# Patient Record
Sex: Female | Born: 1940 | Race: White | Hispanic: No | Smoking: Never smoker
Health system: Southern US, Community
[De-identification: ages and names within clinical notes are randomized; demographics above are authoritative.]

## PROBLEM LIST (undated history)

## (undated) DIAGNOSIS — I639 Cerebral infarction, unspecified: Secondary | ICD-10-CM

## (undated) DIAGNOSIS — F419 Anxiety disorder, unspecified: Secondary | ICD-10-CM

## (undated) DIAGNOSIS — R7303 Prediabetes: Secondary | ICD-10-CM

## (undated) DIAGNOSIS — D649 Anemia, unspecified: Secondary | ICD-10-CM

## (undated) DIAGNOSIS — I350 Nonrheumatic aortic (valve) stenosis: Secondary | ICD-10-CM

## (undated) DIAGNOSIS — J189 Pneumonia, unspecified organism: Secondary | ICD-10-CM

## (undated) DIAGNOSIS — E039 Hypothyroidism, unspecified: Secondary | ICD-10-CM

## (undated) DIAGNOSIS — R011 Cardiac murmur, unspecified: Secondary | ICD-10-CM

## (undated) DIAGNOSIS — G3184 Mild cognitive impairment, so stated: Secondary | ICD-10-CM

## (undated) DIAGNOSIS — F339 Major depressive disorder, recurrent, unspecified: Secondary | ICD-10-CM

## (undated) DIAGNOSIS — Z789 Other specified health status: Secondary | ICD-10-CM

## (undated) DIAGNOSIS — M1990 Unspecified osteoarthritis, unspecified site: Secondary | ICD-10-CM

## (undated) DIAGNOSIS — T7840XA Allergy, unspecified, initial encounter: Secondary | ICD-10-CM

## (undated) DIAGNOSIS — I10 Essential (primary) hypertension: Secondary | ICD-10-CM

## (undated) DIAGNOSIS — Z7409 Other reduced mobility: Secondary | ICD-10-CM

## (undated) DIAGNOSIS — M8580 Other specified disorders of bone density and structure, unspecified site: Secondary | ICD-10-CM

## (undated) HISTORY — PX: TONSILLECTOMY: SUR1361

## (undated) HISTORY — DX: Essential (primary) hypertension: I10

## (undated) HISTORY — PX: NO PAST SURGERIES: SHX2092

## (undated) HISTORY — DX: Allergy, unspecified, initial encounter: T78.40XA

## (undated) HISTORY — DX: Cerebral infarction, unspecified: I63.9

## (undated) HISTORY — PX: CATARACT EXTRACTION: SUR2

## (undated) HISTORY — PX: TOOTH EXTRACTION: SUR596

## (undated) HISTORY — PX: ABDOMINAL HYSTERECTOMY: SHX81

## (undated) HISTORY — PX: JOINT REPLACEMENT: SHX530

## (undated) HISTORY — DX: Other specified disorders of bone density and structure, unspecified site: M85.80

## (undated) HISTORY — PX: BACK SURGERY: SHX140

## (undated) HISTORY — DX: Mild cognitive impairment, so stated: G31.84

## (undated) HISTORY — PX: BRAIN SURGERY: SHX531

## (undated) HISTORY — DX: Nonrheumatic aortic (valve) stenosis: I35.0

## (undated) HISTORY — DX: Other reduced mobility: Z74.09

---
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
## 1998-09-06 ENCOUNTER — Other Ambulatory Visit: Admission: RE | Admit: 1998-09-06 | Discharge: 1998-09-06 | Payer: Self-pay | Admitting: Obstetrics and Gynecology

## 2002-05-04 ENCOUNTER — Encounter: Admission: RE | Admit: 2002-05-04 | Discharge: 2002-05-04 | Payer: Self-pay | Admitting: Family Medicine

## 2009-07-12 ENCOUNTER — Ambulatory Visit: Payer: Self-pay | Admitting: Family Medicine

## 2009-07-12 DIAGNOSIS — E039 Hypothyroidism, unspecified: Secondary | ICD-10-CM | POA: Insufficient documentation

## 2009-07-12 DIAGNOSIS — I10 Essential (primary) hypertension: Secondary | ICD-10-CM | POA: Insufficient documentation

## 2009-07-12 DIAGNOSIS — F411 Generalized anxiety disorder: Secondary | ICD-10-CM | POA: Insufficient documentation

## 2009-07-12 DIAGNOSIS — Z8673 Personal history of transient ischemic attack (TIA), and cerebral infarction without residual deficits: Secondary | ICD-10-CM

## 2009-07-12 DIAGNOSIS — G4700 Insomnia, unspecified: Secondary | ICD-10-CM | POA: Insufficient documentation

## 2009-07-12 LAB — CONVERTED CEMR LAB
ALT: 49 units/L — ABNORMAL HIGH (ref 0–35)
AST: 39 units/L — ABNORMAL HIGH (ref 0–37)
Albumin: 4.4 g/dL (ref 3.5–5.2)
Alkaline Phosphatase: 84 units/L (ref 39–117)
BUN: 14 mg/dL (ref 6–23)
CO2: 26 meq/L (ref 19–32)
Calcium: 10 mg/dL (ref 8.4–10.5)
Chloride: 100 meq/L (ref 96–112)
Cholesterol: 238 mg/dL — ABNORMAL HIGH (ref 0–200)
Creatinine, Ser: 0.87 mg/dL (ref 0.40–1.20)
Glucose, Bld: 112 mg/dL — ABNORMAL HIGH (ref 70–99)
HDL: 56 mg/dL (ref >39)
LDL Cholesterol: 138 mg/dL — ABNORMAL HIGH (ref 0–99)
Potassium: 4.7 meq/L (ref 3.5–5.3)
Sodium: 142 meq/L (ref 135–145)
TSH: 3.923 microintl units/mL (ref 0.350–4.500)
Total Bilirubin: 0.4 mg/dL (ref 0.3–1.2)
Total CHOL/HDL Ratio: 4.3
Total Protein: 7.8 g/dL (ref 6.0–8.3)
Triglycerides: 221 mg/dL — ABNORMAL HIGH (ref <150)
VLDL: 44 mg/dL — ABNORMAL HIGH (ref 0–40)

## 2009-07-22 ENCOUNTER — Encounter: Payer: Self-pay | Admitting: Family Medicine

## 2009-09-02 ENCOUNTER — Ambulatory Visit: Payer: Self-pay | Admitting: Family Medicine

## 2009-09-07 ENCOUNTER — Telehealth: Payer: Self-pay | Admitting: *Deleted

## 2009-09-07 ENCOUNTER — Encounter (INDEPENDENT_AMBULATORY_CARE_PROVIDER_SITE_OTHER): Payer: Self-pay | Admitting: *Deleted

## 2009-09-09 ENCOUNTER — Encounter (INDEPENDENT_AMBULATORY_CARE_PROVIDER_SITE_OTHER): Payer: Self-pay | Admitting: *Deleted

## 2009-09-12 ENCOUNTER — Ambulatory Visit: Payer: Self-pay | Admitting: Gastroenterology

## 2009-09-22 ENCOUNTER — Ambulatory Visit: Payer: Self-pay | Admitting: Gastroenterology

## 2009-12-12 ENCOUNTER — Ambulatory Visit: Payer: Self-pay | Admitting: Family Medicine

## 2009-12-12 DIAGNOSIS — I350 Nonrheumatic aortic (valve) stenosis: Secondary | ICD-10-CM | POA: Insufficient documentation

## 2009-12-12 DIAGNOSIS — I062 Rheumatic aortic stenosis with insufficiency: Secondary | ICD-10-CM

## 2009-12-28 ENCOUNTER — Encounter: Payer: Self-pay | Admitting: *Deleted

## 2010-05-23 ENCOUNTER — Encounter: Payer: Self-pay | Admitting: Family Medicine

## 2010-05-25 ENCOUNTER — Encounter: Payer: Self-pay | Admitting: Family Medicine

## 2010-05-25 ENCOUNTER — Ambulatory Visit: Payer: Self-pay | Admitting: Family Medicine

## 2010-05-25 LAB — CONVERTED CEMR LAB
ALT: 16 units/L (ref 0–35)
AST: 17 units/L (ref 0–37)
Albumin: 4.1 g/dL (ref 3.5–5.2)
Alkaline Phosphatase: 100 units/L (ref 39–117)
BUN: 13 mg/dL (ref 6–23)
CO2: 31 meq/L (ref 19–32)
Calcium: 9.4 mg/dL (ref 8.4–10.5)
Chloride: 100 meq/L (ref 96–112)
Creatinine, Ser: 0.79 mg/dL (ref 0.40–1.20)
Glucose, Bld: 89 mg/dL (ref 70–99)
Potassium: 4.2 meq/L (ref 3.5–5.3)
Sodium: 141 meq/L (ref 135–145)
TSH: 3.803 microintl units/mL (ref 0.350–4.500)
Total Bilirubin: 0.3 mg/dL (ref 0.3–1.2)
Total Protein: 7.3 g/dL (ref 6.0–8.3)

## 2010-05-26 ENCOUNTER — Telehealth: Payer: Self-pay | Admitting: Family Medicine

## 2010-06-01 ENCOUNTER — Encounter: Payer: Self-pay | Admitting: Family Medicine

## 2010-06-07 ENCOUNTER — Encounter: Payer: Self-pay | Admitting: Family Medicine

## 2010-07-13 ENCOUNTER — Encounter (INDEPENDENT_AMBULATORY_CARE_PROVIDER_SITE_OTHER): Payer: Self-pay | Admitting: *Deleted

## 2010-07-20 NOTE — Letter (Signed)
Summary: Previsit letter  [facility]  [address] [place], [place] [address]   Phone: [phone]  Fax: [phone]       09/07/2009 MRN: [id number]  Roizy Gruenberg [address] [place], [place]  [address]  Dear Ms. Madan,  Welcome to the Gastroenterology Division at [facility].    You are scheduled to see a nurse for your pre-procedure visit on 09-12-09 at 1:30p.m. on the 3rd floor at [facility], [address]  We ask that you try to arrive at our office 15 minutes prior to your appointment time to allow for check-in.  Your nurse visit will consist of discussing your medical and surgical history, your immediate family medical history, and your medications.    Please bring a complete list of all your medications or, if you prefer, bring the medication bottles and we will list them.  We will need to be aware of both prescribed and over the counter drugs.  We will need to know exact dosage information as well.  If you are on blood thinners (Coumadin, Plavix, Aggrenox, Ticlid, etc.) please call our office today/prior to your appointment, as we need to consult with your physician about holding your medication.   Please be prepared to read and sign documents such as consent forms, a financial agreement, and acknowledgement forms.  If necessary, and with your consent, a friend or relative is welcome to sit-in on the nurse visit with you.  Please bring your insurance card so that we may make a copy of it.  If your insurance requires a referral to see a specialist, please bring your referral form from your primary care physician.  No co-pay is required for this nurse visit.     If you cannot keep your appointment, please call [phone] to cancel or reschedule prior to your appointment date.  This allows [place] the opportunity to schedule an appointment for another patient in need of care.    Thank you for choosing [facility] for your medical needs.  We  appreciate the opportunity to care for you.  Please visit [place] at our website  to learn more about our practice.                     Sincerely.                                                                                                                   The Gastroenterology Division

## 2010-07-20 NOTE — Letter (Signed)
Summary: Lipid Letter  All     ,     Phone:   Fax:     07/22/2009  Yvonne [name] [address] [place], [place]  [address]  Dear Yvonne [name]:  We have carefully reviewed your last lipid profile from 07/12/2009 and the results are noted below with a summary of recommendations for lipid management.    Cholesterol:       238     Goal: <200   HDL "good" Cholesterol:   56     Goal: >40   LDL "bad" Cholesterol:   138     Goal: <100   Triglycerides:       221     Goal: <150    TLC Diet (Therapeutic Lifestyle Change): Saturated Fats & Transfatty acids should be kept < 7% of total calories Reduce Saturated Fats Polyunstaurated Fat can be up to 10% of total calories Monounsaturated Fat Fat can be up to 20% of total calories Total Fat should be no greater than 25-35% of total calories Carbohydrates should be 50-60% of total calories Protein should be approximately 15% of total calories Fiber should be at least 20-30 grams a day ***Increased fiber may help lower LDL Consider adding plant stanol/sterols to diet (example: Benacol spread) ***A higher intake of unsaturated fat may reduce Triglycerides and Increase HDL    Adjunctive Measures (may lower LIPIDS and reduce risk of Heart Attack) include: Aerobic Exercise (20-30 minutes 3-4 times a week) Limit Alcohol Consumption Weight Reduction Aspirin 75-81 mg a day by mouth (if not allergic or contraindicated) Dietary Fiber 20-30 grams a day by mouth  Your liver tests are very mildly elevated and we should repeat them in 6-8 weeks.  If you have any questions, please call. We appreciate being able to work with you.   Sincerely,     Shelbie Proctor. Shawnie Pons, MD

## 2010-07-20 NOTE — Assessment & Plan Note (Signed)
Summary: Yvonne [name],tcb   Vital Signs:  Patient profile:   70 year old female Height:      62.5 inches Weight:      170 pounds BMI:     30.71 BSA:     1.80 Temp:     97.7 degrees F Pulse rate:   89 / minute BP sitting:   151 / 91  Vitals Entered By: Jone Baseman CMA (July 12, 2009 9:59 AM) CC: Yvonne [name] Is Patient Diabetic? No Pain Assessment Patient in pain? no        CC:  Yvonne [name].  History of Present Illness:  Hypertension follow-up      This is a 70 year old woman who presents for Hypertension.  The patient complains of fatigue, but denies lightheadedness and edema.  The patient denies the following associated symptoms: chest pain, exercise intolerance, palpitations, syncope, and leg edema.  Compliance with medications (by patient report) has been near 100%.  The patient reports that dietary compliance has been excellent.  The patient reports exercising 3-4X per week.   Pt. regular MD is not participating in [employer] this year.  Had mammogram this year.   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 facial droop Hypothyroidiam  Past Surgical History: Hysterectomy Tonsillectomy Brain surgery-remove tumor  Family History: Family History of Alcoholism/Addiction  Social History: Retired Runner, broadcasting/film/video, had tutoring business Widow/Widower Never Smoked Regular exercise-yes Smoking Status:  never Does Patient Exercise:  yes Drug Use:  never Risk analyst Use:  always Sun Exposure-Excessive:  infrequent  Review of Systems  The  patient denies anorexia, weight loss, weight gain, decreased hearing, chest pain, syncope, dyspnea on exertion, peripheral edema, abdominal pain, melena, and severe indigestion/heartburn.    Physical Exam  General:  alert and good hygiene.   Head:  normocephalic and atraumatic.   Neck:  supple.   Lungs:  normal respiratory effort and normal breath sounds.   Heart:  normal rate, regular rhythm, and no murmur.   Abdomen:  soft and non-tender.   Psych:  memory intact for recent and remote, normally interactive, and good eye contact.  hard of hearing   Impression & Recommendations:  Problem # 1:  HYPERTENSION (ICD-401.9) Gets meds through mail order [facility] in [place]--Needs 1 mo refill @ [facility], then will start using mail order---these are printed and given to pt. Her updated medication list for this problem includes:    Benazepril-hydrochlorothiazide 20-12.5 Mg Tabs (Benazepril-hydrochlorothiazide) .Marland Kitchen... 1 by mouth daily  Orders: Comp Met-[facility] [phone]) Lipid-[facility] ([id number])  Problem # 2:  HYPOTHYROIDISM, UNSPECIFIED (ICD-244.9)  Her updated medication list for this problem includes:    Levothroid 25 Mcg Tabs (Levothyroxine sodium) .Marland Kitchen... 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 1 by mouth daily 2)  Levothroid 25 Mcg Tabs (Levothyroxine sodium) .Marland Kitchen.. 1 by mouth daily 3)  Amitriptyline Hcl 50 Mg Tabs (Amitriptyline hcl) .Marland KitchenMarland KitchenMarland Kitchen  1 po three times a day 4)  Zolpidem Tartrate 10 Mg Tabs (Zolpidem tartrate) .Marland Kitchen.. 1 by mouth q hs as needed   Patient Instructions: 1)  Please schedule a follow-up appointment in 3 months .  Prescriptions: ZOLPIDEM TARTRATE 10 MG TABS (ZOLPIDEM TARTRATE) 1 by mouth q hs as needed  #90 x 3   Entered and Authorized by:   Tinnie Gens MD   Signed by:   Tinnie Gens MD on  07/12/2009   Method used:   Print then Give to Patient   RxID:   [id number] AMITRIPTYLINE HCL 50 MG TABS (AMITRIPTYLINE HCL) 1 po three times a day  #270 x 3   Entered and Authorized by:   Tinnie Gens MD   Signed by:   Tinnie Gens MD on 07/12/2009   Method used:   Print then Give to Patient   RxID:   [id number] LEVOTHROID 25 MCG TABS (LEVOTHYROXINE SODIUM) 1 by mouth daily  #90 x 3   Entered and Authorized by:   Tinnie Gens MD   Signed by:   Tinnie Gens MD on 07/12/2009   Method used:   Print then Give to Patient   RxID:   [id number] BENAZEPRIL-HYDROCHLOROTHIAZIDE 20-12.5 MG TABS (BENAZEPRIL-HYDROCHLOROTHIAZIDE) 1 by mouth daily  #90 x 3   Entered and Authorized by:   Tinnie Gens MD   Signed by:   Tinnie Gens MD on 07/12/2009   Method used:   Print then Give to Patient   RxID:   [id number] ZOLPIDEM TARTRATE 10 MG TABS (ZOLPIDEM TARTRATE) 1 by mouth q hs as needed  #30 x 2   Entered and Authorized by:   Tinnie Gens MD   Signed by:   Tinnie Gens MD on 07/12/2009   Method used:   Print then Give to Patient   RxID:   [id number] AMITRIPTYLINE HCL 50 MG TABS (AMITRIPTYLINE HCL) 1 po three times a day  #90 x 2   Entered and Authorized by:   Tinnie Gens MD   Signed by:   Tinnie Gens MD on 07/12/2009   Method used:   Electronically to        [facility] [phone]* (retail)       [address]       [place], [place]  [address]       Ph: [phone]       Fax: [phone]   RxID:   [id number] LEVOTHROID 25 MCG TABS (LEVOTHYROXINE SODIUM) 1 by mouth daily  #30 x 2   Entered and Authorized by:   Tinnie Gens MD   Signed by:   Tinnie Gens MD on 07/12/2009   Method used:   Electronically to        [facility] [phone]* (retail)       [address]       [place], [place]  [address]       Ph: [phone]       Fax: [phone]   RxID:   [id number] BENAZEPRIL-HYDROCHLOROTHIAZIDE 20-12.5 MG TABS (BENAZEPRIL-HYDROCHLOROTHIAZIDE) 1 by mouth  daily  #30 x 2   Entered and Authorized by:   Tinnie Gens MD   Signed by:   Tinnie Gens MD on 07/12/2009   Method used:   Electronically to        [facility] [phone]* (retail)       [address]       [place], [place]  [address]       Ph: [phone]  Fax: [phone]   RxID:   [id number]   Prevention & Chronic Care Immunizations   Influenza vaccine: Not documented   Influenza vaccine deferral: Refused  (07/12/2009)    Tetanus booster: Not documented    Pneumococcal vaccine: Not documented    H. zoster vaccine: Not documented  Colorectal Screening   Hemoccult: Not documented    Colonoscopy: Not documented   Colonoscopy action/deferral: Refused  (07/12/2009)  Other Screening   Pap smear: Not documented   Pap smear action/deferral: Not indicated S/P hysterectomy  (07/12/2009)    Mammogram: Not documented   Mammogram due: 06/19/2010    DXA bone density scan: Not documented  Reports requested:   Last mammogram report requested.  Smoking status: never  (07/12/2009)  Lipids   Total Cholesterol: Not documented   LDL: Not documented   LDL Direct: Not documented   HDL: Not documented   Triglycerides: Not documented  Hypertension   Last Blood Pressure: 151 / 91  (07/12/2009)   Serum creatinine: Not documented   Serum potassium Not documented CMP ordered     Hypertension flowsheet reviewed?: Yes   Progress toward BP goal: Unchanged  Self-Management Support :    Hypertension self-management support: Not documented   Nursing Instructions: Request report of last mammogram

## 2010-07-20 NOTE — Procedures (Signed)
Summary: Colonoscopy  Patient: [name] Note: All result statuses are Final unless otherwise noted.  Tests: (1) Colonoscopy (COL)   COL Colonoscopy           DONE     [facility]     [address]     [place], [place]  [address]           COLONOSCOPY PROCEDURE REPORT           PATIENT:  Yvonne [name], Yvonne [name]  MR#:  [id number]     BIRTHDATE:  01-15-1941, 68 yrs. old  GENDER:  female           ENDOSCOPIST:  Barbette Hair. Arlyce Dice, MD     Referred by:           PROCEDURE DATE:  09/22/2009     PROCEDURE:  Diagnostic Colonoscopy     ASA CLASS:  Class II     INDICATIONS:  1) Routine Risk Screening           MEDICATIONS:   Fentanyl 50 mcg IV, Versed 6 mg IV           DESCRIPTION OF PROCEDURE:   After the risks benefits and     alternatives of the procedure were thoroughly explained, informed     consent was obtained.  Digital rectal exam was performed and     revealed no abnormalities.   The LB [id number] endoscope     was introduced through the anus and advanced to the cecum, which     was identified by both the appendix and ileocecal valve, without     limitations.  The quality of the prep was excellent, using     MoviPrep.  The instrument was then slowly withdrawn as the colon     was fully examined.     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     This was otherwise a normal examination of the colon (see image1,     image2, image5, image7, image13, image14, image16, and image17).     Retroflexed views in the rectum revealed no abnormalities.    The     time to cecum = 06:23 minutes. The scope was then withdrawn (time     = 07:23 min) from the patient and the procedure completed.           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           REPEAT EXAM:  In 10 year(s) for Colonoscopy.           ______________________________     Barbette Hair. Arlyce Dice, MD           CC: Jonette Eva, MD           n.     Rosalie DoctorBarbette Hair. Keyler Hoge at 09/22/2009 09:57 AM           Dedra Skeens, [id number]  Note: An exclamation mark (!) indicates a result that was not dispersed into the flowsheet. Document Creation Date: 09/22/2009 9:57 AM _______________________________________________________________________  (1) Order result status: Final  Collection or observation date-time: 09/22/2009 09:46 Requested date-time:  Receipt date-time:  Reported date-time:  Referring Physician:   Ordering Physician: Melvia Heaps [phone]) Specimen Source:  Source: Launa Grill Order Number: [id number] Lab site:   Appended Document: Colonoscopy    Clinical Lists Changes  Observations: Added new observation of COLONNXTDUE: 09/2019 (09/22/2009 12:45)

## 2010-07-20 NOTE — Letter (Signed)
Summary: Generic Letter  [facility]  [address]   [place], [place] [address]   Phone: [phone]  Fax: [phone]    07/13/2010  [address] [place], [place]  [address]  Dear Yvonne [name],  We are happy to let you know that since you are covered under Medicare you are able to have a FREE visit at the [facility] to discuss your HEALTH. This is a new benefit for Medicare.  There will be no co-payment.  At this visit you will meet with [name] an expert in wellness and the health coach at our clinic.  At this visit we will discuss ways to keep you healthy and feeling well.  This visit will not replace your regular doctor visit and we cannot refill medications.     You will need to plan to be here at least one hour to talk about your medical history, your current status, review all of your medications, and discuss your future plans for your health.  This information will be entered into your record for your doctor to have and review.  If you are interested in staying healthy, this type of visit can help.  Please call the office at: [phone], to schedule a "Medicare Wellness Visit".  The day of the visit you should bring in all of your medications, including any vitamins, herbs, over the counter products you take.  Make a list of all the other doctors that you see, so we know who they are. If you have any other health documents please bring them.  We look forward to helping you stay healthy.  Sincerely,   [name]  [facility]

## 2010-07-20 NOTE — Miscellaneous (Signed)
  Clinical Lists Changes  Problems: Added new problem of SYSTOLIC MURMUR (ZOX-096.0)

## 2010-07-20 NOTE — Progress Notes (Signed)
Summary: Yvonne [name]- phn msg  Phone Note Call from Patient Call back at [facility] Phone [phone]   Caller: Patient Summary of Call: got another mammogram done and it is just mass and not cancerous. has to go back in 6 months - per [facility] Initial call taken by: De Nurse,  May 26, 2010 11:02 AM

## 2010-07-20 NOTE — Assessment & Plan Note (Signed)
Summary: f/u last visit/eo   Vital Signs:  Patient profile:   70 year old female Height:      62.5 inches Weight:      169 pounds BMI:     30.53 BSA:     1.79 Temp:     97.6 degrees F Pulse rate:   83 / minute BP sitting:   166 / 95  Vitals Entered By: Jone Baseman CMA (September 02, 2009 10:59 AM) CC: f/u Is Patient Diabetic? No Pain Assessment Patient in pain? no        CC:  f/u.  History of Present Illness:  Hypertension follow-up      This is a 70 year old woman who presents for Hypertension follow-up.  The patient complains of lightheadedness, but denies headaches, edema, and rash.  Associated symptoms include chest pain, dyspnea, palpitations, and leg edema.  Compliance with medications (by patient report) has been near 100%.  The patient reports that dietary compliance has been excellent.  The patient reports exercising 3-4X per week.  Adjunctive measures currently used by the patient include salt restriction.    Habits & Providers  Alcohol-Tobacco-Diet     Tobacco Status: never  Current Problems (verified): 1)  Special Screening For Malignant Neoplasms Colon  (ICD-V76.51) 2)  Insomnia, Persistent  (ICD-307.42) 3)  Hypothyroidism, Unspecified  (ICD-244.9) 4)  Family History of Alcoholism/addiction  (ICD-V61.41) 5)  Cerebrovascular Accident, Hx of  (ICD-V12.50) 6)  Hypertension  (ICD-401.9) 7)  Anxiety  (ICD-300.00)  Current Medications (verified): 1)  Levothroid 25 Mcg Tabs (Levothyroxine Sodium) .Marland Kitchen.. 1 By Mouth Daily 2)  Amitriptyline Hcl 50 Mg Tabs (Amitriptyline Hcl) .Marland Kitchen.. 1 Po Three Times A Day 3)  Zolpidem Tartrate 10 Mg Tabs (Zolpidem Tartrate) .Marland Kitchen.. 1 By Mouth Q Hs As Needed 4)  Benazepril-Hydrochlorothiazide 20-25 Mg Tabs (Benazepril-Hydrochlorothiazide) .Marland Kitchen.. 1 By Mouth Daily  Allergies (verified): No Known Drug Allergies  Past History:  Past Medical History: Last updated: 07/12/2009 Anxiety Hypertension Brain tumor 1989-benign  removed Cerebrovascular accident, hx of-CVA 1972 residual right arm weakness, prev. facial droop Hypothyroidiam  Past Surgical History: Last updated: 07/12/2009 Hysterectomy Tonsillectomy Brain surgery-remove tumor  Family History: Last updated: 07/12/2009 Family History of Alcoholism/Addiction  Social History: Last updated: 07/12/2009 Retired Runner, broadcasting/film/video, had tutoring business Widow/Widower Never Smoked Regular exercise-yes  Risk Factors: Alcohol Use: 0 (07/12/2009) Exercise: yes (07/12/2009)  Risk Factors: Smoking Status: never (09/02/2009)  Review of Systems  The patient denies anorexia, fever, weight loss, weight gain, decreased hearing, chest pain, syncope, dyspnea on exertion, peripheral edema, prolonged cough, headaches, abdominal pain, hematochezia, and severe indigestion/heartburn.    Physical Exam  General:  alert, well-developed, and well-nourished.   Head:  normocephalic and atraumatic.   Neck:  supple.   Lungs:  normal respiratory effort.   Heart:  normal rate.   Abdomen:  soft and non-tender.   Msk:  normal ROM.   Neurologic:  alert & oriented X3.     Impression & Recommendations:  Problem # 1:  HYPOTHYROIDISM, UNSPECIFIED (ICD-244.9)  Her updated medication list for this problem includes:    Levothroid 25 Mcg Tabs (Levothyroxine sodium) .Marland Kitchen... 1 by mouth daily  Orders: [facility]- Est Level  3 ([address])  Problem # 2:  HYPERTENSION (ICD-401.9)  The following medications were removed from the medication list:    Benazepril-hydrochlorothiazide 20-12.5 Mg Tabs (Benazepril-hydrochlorothiazide) .Marland Kitchen... 1 by mouth daily Her updated medication list for this problem includes:    Benazepril-hydrochlorothiazide 20-25 Mg Tabs (Benazepril-hydrochlorothiazide) .Marland Kitchen... 1 by mouth daily  Orders:  [facility]- Est Level  3 ([address]) Will send for colonoscopy  Complete Medication List: 1)  Levothroid 25 Mcg Tabs (Levothyroxine sodium) .Marland Kitchen.. 1 by mouth daily 2)  Amitriptyline Hcl 50  Mg Tabs (Amitriptyline hcl) .Marland Kitchen.. 1 po three times a day 3)  Zolpidem Tartrate 10 Mg Tabs (Zolpidem tartrate) .Marland Kitchen.. 1 by mouth q hs as needed 4)  Benazepril-hydrochlorothiazide 20-25 Mg Tabs (Benazepril-hydrochlorothiazide) .Marland Kitchen.. 1 by mouth daily  Other Orders: Gastroenterology Referral (GI)  Patient Instructions: 1)  Please schedule a follow-up appointment in 3 months .    Prevention & Chronic Care Immunizations   Influenza vaccine: Not documented   Influenza vaccine deferral: Refused  (07/12/2009)    Tetanus booster: Not documented    Pneumococcal vaccine: Not documented    H. zoster vaccine: Not documented  Colorectal Screening   Hemoccult: Not documented    Colonoscopy: Not documented   Colonoscopy action/deferral: GI referral  (09/02/2009)  Other Screening   Pap smear: Not documented   Pap smear action/deferral: Not indicated S/P hysterectomy  (07/12/2009)    Mammogram: Not documented   Mammogram due: 06/19/2010    DXA bone density scan: Not documented   Smoking status: never  (09/02/2009)  Lipids   Total Cholesterol: 238  (07/12/2009)   LDL: 138  (07/12/2009)   LDL Direct: Not documented   HDL: 56  (07/12/2009)   Triglycerides: 221  (07/12/2009)   Lipid panel due: 07/12/2010  Hypertension   Last Blood Pressure: 166 / 95  (09/02/2009)   Serum creatinine: 0.87  (07/12/2009)   Serum potassium 4.7  (07/12/2009)    Hypertension flowsheet reviewed?: Yes   Progress toward BP goal: Deteriorated  Self-Management Support :    Hypertension self-management support: Not documented   Nursing Instructions: GI referral for screening colonoscopy (see order)  Prescriptions: BENAZEPRIL-HYDROCHLOROTHIAZIDE 20-25 MG TABS (BENAZEPRIL-HYDROCHLOROTHIAZIDE) 1 by mouth daily  #90 x 3   Entered and Authorized by:   Tinnie Gens MD   Signed by:   Tinnie Gens MD on 09/02/2009   Method used:   Print then Give to Patient   RxID:   [id number]

## 2010-07-20 NOTE — Letter (Signed)
Summary: [facility] Instructions  [facility]  [address] [place], [place] [address]   Phone: [phone]  Fax: [phone]       Yvonne [name]    05/10/1941    MRN: [id number]        Procedure Day /Date:  Thursday 09/22/2009     Arrival Time: 8:00 am      Procedure Time: 9:00 am     Location of Procedure:                    _ x_  [facility] (4th Floor)                        PREPARATION FOR COLONOSCOPY WITH MOVIPREP   Starting 5 days prior to your procedure Saturday 4/2 do not eat nuts, seeds, popcorn, corn, beans, peas,  salads, or any raw vegetables.  Do not take any fiber supplements (e.g. Metamucil, Citrucel, and Benefiber).  THE DAY BEFORE YOUR PROCEDURE         DATE: Wednesday 4/6  1.  Drink clear liquids the entire day-NO SOLID FOOD  2.  Do not drink anything colored red or purple.  Avoid juices with pulp.  No orange juice.  3.  Drink at least 64 oz. (8 glasses) of fluid/clear liquids during the day to prevent dehydration and help the prep work efficiently.  CLEAR LIQUIDS INCLUDE: Water Jello Ice Popsicles Tea (sugar ok, no milk/cream) Powdered fruit flavored drinks Coffee (sugar ok, no milk/cream) Gatorade Juice: apple, white grape, white cranberry  Lemonade Clear bullion, consomm, broth Carbonated beverages (any kind) Strained chicken noodle soup Hard Candy                             4.  In the morning, mix first dose of MoviPrep solution:    Empty 1 Pouch A and 1 Pouch B into the disposable container    Add lukewarm drinking water to the top line of the container. Mix to dissolve    Refrigerate (mixed solution should be used within 24 hrs)  5.  Begin drinking the prep at 5:00 p.m. The MoviPrep container is divided by 4 marks.   Every 15 minutes drink the solution down to the next mark (approximately 8 oz) until the full liter is complete.   6.  Follow completed prep with 16 oz of clear liquid of your choice (Nothing  red or purple).  Continue to drink clear liquids until bedtime.  7.  Before going to bed, mix second dose of MoviPrep solution:    Empty 1 Pouch A and 1 Pouch B into the disposable container    Add lukewarm drinking water to the top line of the container. Mix to dissolve    Refrigerate  THE DAY OF YOUR PROCEDURE      DATE: Thursday 4/7  Beginning at 4:00 am (5 hours before procedure):         1. Every 15 minutes, drink the solution down to the next mark (approx 8 oz) until the full liter is complete.  2. Follow completed prep with 16 oz. of clear liquid of your choice.    3. You may drink clear liquids until 7:00 am (2 HOURS BEFORE PROCEDURE).   MEDICATION INSTRUCTIONS  Unless otherwise instructed, you should take regular prescription medications with a small sip of water   as early as possible the morning of  your procedure.    Additional medication instructions:  Do not take Benazepril/HCTZ morning of procedure.         OTHER INSTRUCTIONS  You will need a responsible adult at least 72 years of age to accompany you and drive you home.   This person must remain in the waiting room during your procedure.  Wear loose fitting clothing that is easily removed.  Leave jewelry and other valuables at home.  However, you may wish to bring a book to read or  an iPod/MP3 player to listen to music as you wait for your procedure to start.  Remove all body piercing jewelry and leave at home.  Total time from sign-in until discharge is approximately 2-3 hours.  You should go home directly after your procedure and rest.  You can resume normal activities the  day after your procedure.  The day of your procedure you should not:   Drive   Make legal decisions   Operate machinery   Drink alcohol   Return to work  You will receive specific instructions about eating, activities and medications before you leave.    The above instructions have been reviewed and explained to  me by   Ezra Sites RN  September 12, 2009 1:53 PM     I fully understand and can verbalize these instructions _____________________________ Date _________

## 2010-07-20 NOTE — Progress Notes (Signed)
----   Converted from flag ---- ---- 09/06/2009 8:38 PM, Tinnie Gens MD wrote: colonoscopy  ---- 09/02/2009 1:56 PM, Theresia Lo RN wrote:  what is the  reason for sending patient to GI. need diagnosis. Larita Fife ------------------------------ appointment scheduled. Theresia Lo RN  September 07, 2009 10:42 AM

## 2010-07-20 NOTE — Assessment & Plan Note (Signed)
Summary: f/u,df   Vital Signs:  Patient profile:   70 year old female Height:      62.5 inches Weight:      164.5 pounds BMI:     29.71 Pulse rate:   78 / minute BP sitting:   130 / 80  (right arm) Cuff size:   regular  Vitals Entered By: Arlyss Repress CMA, (December 12, 2009 10:54 AM) CC: f/up colonoscopy. HTN. refill meds. mail order...3 mos supply with refills. Is Patient Diabetic? No Pain Assessment Patient in pain? no        CC:  f/up colonoscopy. HTN. refill meds. mail order...3 mos supply with refills..  History of Present Illness:  Hypertension follow-up      This is a 70 year old woman who presents for Hypertension follow-up.  The patient denies urinary frequency, headaches, rash, and fatigue.  The patient denies the following associated symptoms: chest pain, chest pressure, dyspnea, syncope, and pedal edema.  Compliance with medications (by patient report) has been near 100%.  The patient reports that dietary compliance has been excellent.  The patient reports exercising 3-4X per week.  Adjunctive measures currently used by the patient include salt restriction and relaxation.    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 1 By Mouth Daily 2)  Amitriptyline Hcl 50 Mg Tabs (Amitriptyline Hcl) .... 2 By Mouth Q Hs 3)  Zolpidem Tartrate 10 Mg Tabs (Zolpidem Tartrate) .Marland Kitchen.. 1 By Mouth Q Hs As Needed 4)  Benazepril-Hydrochlorothiazide 20-25 Mg Tabs (Benazepril-Hydrochlorothiazide) .Marland Kitchen.. 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 facial droop Hypothyroidiam  Past Surgical History: Last updated: 07/12/2009 Hysterectomy Tonsillectomy Brain surgery-remove tumor  Family History: Last updated: 07/12/2009 Family History of Alcoholism/Addiction  Social History: Last updated: 07/12/2009 Retired Runner, broadcasting/film/video, had tutoring business Widow/Widower Never Smoked Regular exercise-yes  Risk Factors: Alcohol Use: 0 (07/12/2009) Exercise: yes (07/12/2009)  Risk Factors: Smoking Status: quit (12/12/2009)  Social History: Smoking Status:  quit  Review of Systems  The patient denies anorexia, fever, weight loss, weight gain, chest pain, syncope, dyspnea on exertion, peripheral edema, prolonged cough, headaches, hemoptysis, abdominal pain, and severe indigestion/heartburn.    Physical Exam  General:  alert, well-developed, and well-nourished.   Head:  normocephalic and atraumatic.   Mouth:  good dentition.   Neck:  supple.   Lungs:  normal respiratory effort, no intercostal retractions, and normal breath sounds.   Heart:  normal rate, regular rhythm, and grade  2/6 systolic murmur.   Abdomen:  soft and non-tender.     Impression & Recommendations:  Problem # 1:  HYPOTHYROIDISM, UNSPECIFIED (ICD-244.9)  Her updated medication list for this problem includes:    Levothroid 25 Mcg Tabs (Levothyroxine sodium) .Marland Kitchen... 1 by mouth daily  Her updated medication list for this problem includes:    Levothroid 25 Mcg Tabs (Levothyroxine sodium) .Marland Kitchen... 1 by mouth daily  Orders: [facility]- Est Level  3 ([address])  Problem # 2:  HYPERTENSION (ICD-401.9)  Her updated medication list for this problem includes:    Benazepril-hydrochlorothiazide 20-25 Mg Tabs (Benazepril-hydrochlorothiazide) .Marland Kitchen... 1 by mouth daily  Her  updated medication list for this problem includes:    Benazepril-hydrochlorothiazide 20-25 Mg Tabs (Benazepril-hydrochlorothiazide) .Marland Kitchen... 1 by mouth daily  Orders: [facility]- Est Level  3 ([address])  Complete Medication List: 1)  Levothroid 25 Mcg Tabs (Levothyroxine sodium) .Marland Kitchen.. 1 by mouth daily 2)  Amitriptyline Hcl 50 Mg Tabs (Amitriptyline hcl) .... 2 by mouth q hs 3)  Zolpidem Tartrate 10 Mg Tabs (Zolpidem tartrate) .Marland Kitchen.. 1 by mouth q hs as needed 4)  Benazepril-hydrochlorothiazide 20-25 Mg Tabs (Benazepril-hydrochlorothiazide) .Marland Kitchen.. 1 by mouth daily  Patient Instructions: 1)  Please schedule a follow-up appointment in 6 months .  Prescriptions: ZOLPIDEM TARTRATE 10 MG TABS (ZOLPIDEM TARTRATE) 1 by mouth q hs as needed  #90 x 3   Entered and Authorized by:   Tinnie Gens MD   Signed by:   Tinnie Gens MD on 12/12/2009   Method used:   Print then Give to Patient   RxID:   [id number]

## 2010-07-20 NOTE — Miscellaneous (Signed)
Summary: [facility]  Clinical Lists Changes  Medications: Added new medication of MOVIPREP 100 GM  SOLR (PEG-KCL-NACL-NASULF-NA ASC-C) As per prep instructions. - Signed Rx of MOVIPREP 100 GM  SOLR (PEG-KCL-NACL-NASULF-NA ASC-C) As per prep instructions.;  #1 x 0;  Signed;  Entered by: Ezra Sites RN;  Authorized by: Louis Meckel MD;  Method used: Electronically to [facility] [phone]*, [address] [place], [place]  [address], Ph: [phone], Fax: [phone] Observations: Added new observation of NKA: T (09/12/2009 13:29)    Prescriptions: MOVIPREP 100 GM  SOLR (PEG-KCL-NACL-NASULF-NA ASC-C) As per prep instructions.  #1 x 0   Entered by:   Ezra Sites RN   Authorized by:   Louis Meckel MD   Signed by:   Ezra Sites RN on 09/12/2009   Method used:   Electronically to        [facility] [phone]* (retail)       [address]       [place], [place]  [address]       Ph: [phone]       Fax: [phone]   RxID:   [id number]

## 2010-07-20 NOTE — Letter (Signed)
Summary: Results Follow-up Letter  All     ,     Phone:   Fax:     06/01/2010  [address] [place], [place]  [address]  Dear Ms. Urda,   The following are the results of your recent test(s):  Your electolytes, kidney function, liver tests and thyroid  are all normal.   Sincerely,     Tinnie Gens MD            Appended Document: Results Follow-up Letter letter mailed

## 2010-07-20 NOTE — Assessment & Plan Note (Signed)
Summary: routine visit/eo   Vital Signs:  Patient profile:   70 year old female Height:      62.5 inches Weight:      164.5 pounds BMI:     29.71 Temp:     97.8 degrees F oral Pulse rate:   70 / minute BP sitting:   161 / 94  (left arm) Cuff size:   regular  Vitals Entered By: Garen Grams LPN (May 25, 2010 12:00 PM) CC: f/u bp, meds Is Patient Diabetic? No Pain Assessment Patient in pain? no        CC:  f/u bp and meds.  History of Present Illness:  Hypertension follow-up      This is a 70 year old woman who presents for Hypertension follow-up.  The patient denies lightheadedness, headaches, edema, and fatigue.  The patient denies the following associated symptoms: chest pain, dyspnea, and leg edema.  Compliance with medications (by patient report) has been near 100%.  The patient reports that dietary compliance has been excellent.  The patient reports exercising 3-4X per week.  Adjunctive measures currently used by the patient include salt restriction.    Has to have more imaging done for mammography today and is worried and thinks this is why her BP is up.  Habits & Providers  Alcohol-Tobacco-Diet     Alcohol drinks/day: 0     Tobacco Status: quit     Tobacco Counseling: to remain off tobacco products  Current Medications (verified): 1)  Levothroid 25 Mcg Tabs (Levothyroxine Sodium) .Marland Kitchen.. 1 By Mouth Daily 2)  Amitriptyline Hcl 50 Mg Tabs (Amitriptyline Hcl) .Marland Kitchen.. 1 By Mouth Three Times A Day 3)  Zolpidem Tartrate 10 Mg Tabs (Zolpidem Tartrate) .Marland Kitchen.. 1 By Mouth Q Hs As Needed 4)  Benazepril-Hydrochlorothiazide 20-25 Mg Tabs (Benazepril-Hydrochlorothiazide) .Marland Kitchen.. 1 By Mouth Daily 5)  Multivitamins  Tabs (Multiple Vitamin) .Marland Kitchen.. 1 By Mouth Daily 6)  Aspir-Low 81 Mg Tbec (Aspirin) .Marland Kitchen.. 1 By Mouth Daily  Allergies (verified): No Known Drug Allergies  Past History:  Past Medical History: Last updated: 07/12/2009 Anxiety Hypertension Brain tumor 1989-benign  removed Cerebrovascular accident, hx of-CVA 1972 residual right arm weakness, prev. facial droop Hypothyroidiam  Past Surgical History: Last updated: 07/12/2009 Hysterectomy Tonsillectomy Brain surgery-remove tumor  Family History: Last updated: 07/12/2009 Family History of Alcoholism/Addiction  Social History: Last updated: 07/12/2009 Retired Runner, broadcasting/film/video, had tutoring business Widow/Widower Never Smoked Regular exercise-yes  Risk Factors: Alcohol Use: 0 (05/25/2010) Exercise: yes (07/12/2009)  Risk Factors: Smoking Status: quit (05/25/2010)  Review of Systems  The patient denies anorexia, fever, chest pain, syncope, dyspnea on exertion, peripheral edema, prolonged cough, headaches, abdominal pain, and severe indigestion/heartburn.    Physical Exam  General:  alert, well-developed, and well-nourished.   Head:  normocephalic and atraumatic.   Neck:  supple.   Lungs:  normal respiratory effort.   Heart:  normal rate.   Abdomen:  soft.     Impression & Recommendations:  Problem # 1:  INSOMNIA, PERSISTENT (ICD-307.42)  Orders: [facility]- Est Level  3 ([address]644)  Problem # 2:  HYPERTENSION (ICD-401.9)  Her updated medication list for this problem includes:    Benazepril-hydrochlorothiazide 20-25 Mg Tabs (Benazepril-hydrochlorothiazide) .Marland Kitchen... 1 by mouth daily  Orders: [facility] ([address]74-25956)  Problem # 3:  ANXIETY (ICD-300.00)  Her updated medication list for this problem includes:    Amitriptyline Hcl 50 Mg Tabs (Amitriptyline hcl) .Marland Kitchen... 1 by mouth three times a day  Orders: [facility]- Est Level  3 ([address]756)  Problem #  4:  HYPOTHYROIDISM, UNSPECIFIED (ICD-244.9)  Her updated medication list for this problem includes:    Levothroid 25 Mcg Tabs (Levothyroxine sodium) .Marland Kitchen... 1 by mouth daily  Orders: [facility] ([address]65) [facility]- Est Level  3 (78469)  Complete Medication List: 1)  Levothroid 25 Mcg Tabs (Levothyroxine sodium) .Marland Kitchen.. 1 by mouth daily 2)  Amitriptyline Hcl  50 Mg Tabs (Amitriptyline hcl) .Marland Kitchen.. 1 by mouth three times a day 3)  Zolpidem Tartrate 10 Mg Tabs (Zolpidem tartrate) .Marland Kitchen.. 1 by mouth q hs as needed 4)  Benazepril-hydrochlorothiazide 20-25 Mg Tabs (Benazepril-hydrochlorothiazide) .Marland Kitchen.. 1 by mouth daily 5)  Multivitamins Tabs (Multiple vitamin) .Marland Kitchen.. 1 by mouth daily 6)  Aspir-low 81 Mg Tbec (Aspirin) .Marland Kitchen.. 1 by mouth daily  Patient Instructions: 1)  Please schedule a follow-up appointment in 2 months.  Prescriptions: BENAZEPRIL-HYDROCHLOROTHIAZIDE 20-25 MG TABS (BENAZEPRIL-HYDROCHLOROTHIAZIDE) 1 by mouth daily  #90 x 3   Entered and Authorized by:   Tinnie Gens MD   Signed by:   Tinnie Gens MD on 05/25/2010   Method used:   Print then Give to Patient   RxID:   [id number] ZOLPIDEM TARTRATE 10 MG TABS (ZOLPIDEM TARTRATE) 1 by mouth q hs as needed  #90 x 3   Entered and Authorized by:   Tinnie Gens MD   Signed by:   Tinnie Gens MD on 05/25/2010   Method used:   Print then Give to Patient   RxID:   [id number] AMITRIPTYLINE HCL 50 MG TABS (AMITRIPTYLINE HCL) 1 by mouth three times a day  #270 x 0   Entered and Authorized by:   Tinnie Gens MD   Signed by:   Tinnie Gens MD on 05/25/2010   Method used:   Print then Give to Patient   RxID:   [id number] LEVOTHROID 25 MCG TABS (LEVOTHYROXINE SODIUM) 1 by mouth daily  #90 x 3   Entered and Authorized by:   Tinnie Gens MD   Signed by:   Tinnie Gens MD on 05/25/2010   Method used:   Print then Give to Patient   RxID:   [id number]    Prevention & Chronic Care Immunizations   Influenza vaccine: [facility]  (03/27/2010)   Influenza vaccine deferral: Refused  (07/12/2009)    Tetanus booster: Not documented    Pneumococcal vaccine: Not documented    H. zoster vaccine: Not documented   H. zoster vaccine deferral: Deferred  (05/25/2010)  Colorectal Screening   Hemoccult: Not documented   Hemoccult action/deferral: Not indicated  (05/25/2010)    Colonoscopy: DONE   (09/22/2009)   Colonoscopy action/deferral: GI referral  (09/02/2009)   Colonoscopy due: 09/2019  Other Screening   Pap smear: Not documented   Pap smear action/deferral: Not indicated S/P hysterectomy  (07/12/2009)    Mammogram: abnl calcifications  (05/09/2010)   Mammogram due: 06/19/2010    DXA bone density scan: Not documented   DXA bone density action/deferral: Deferred  (05/25/2010)   Smoking status: quit  (05/25/2010)  Lipids   Total Cholesterol: 238  (07/12/2009)   Lipid panel action/deferral: LDL Direct ordered   LDL: 355  (07/12/2009)   LDL Direct: Not documented   HDL: 56  (07/12/2009)   Triglycerides: 221  (07/12/2009)   Lipid panel due: 06/25/2010  Hypertension   Last Blood Pressure: 161 / 94  (05/25/2010)   Serum creatinine: 0.87  (07/12/2009)   BMP action: Ordered   Serum potassium 4.7  (07/12/2009) CMP ordered     Hypertension flowsheet reviewed?: Yes   Progress toward  BP goal: Unchanged  Self-Management Support :    Hypertension self-management support: Pre-printed educational material, Written self-care plan  (05/25/2010)   Hypertension self-care plan printed.   Nursing Instructions: Give tetanus booster today Give Pneumovax today    Orders Added: 1)  [facility] [[address]454] 2)  Comp Met-[facility] [80053-22900] 3)  [facility]- Est Level  3 [09811]

## 2010-07-20 NOTE — Miscellaneous (Signed)
Summary: Rx request to mail in  Clinical Lists Changes Ms. Dolbow is needing Dr. Shawnie Pons to rewrite her rx for Zolpidem and mail to her pharmacy.  She said she sent the original but they haven't received it.  She has the order form attached for Dr. Shawnie Pons to sign and send off. Abundio Miu  December 28, 2009 3:54 PM     Pt called to let MD know she did receive her meds by mail, no Rx is needed. Denny Peon Odell  December 29, 2009 9:15 AM

## 2010-09-20 ENCOUNTER — Encounter: Payer: Self-pay | Admitting: Family Medicine

## 2010-09-20 ENCOUNTER — Ambulatory Visit (INDEPENDENT_AMBULATORY_CARE_PROVIDER_SITE_OTHER): Payer: Medicare HMO | Admitting: Family Medicine

## 2010-09-20 VITALS — BP 150/86 | HR 72 | Wt 152.0 lb

## 2010-09-20 DIAGNOSIS — G4700 Insomnia, unspecified: Secondary | ICD-10-CM

## 2010-09-20 MED ORDER — ESZOPICLONE 2 MG PO TABS: 2.0000 mg | ORAL_TABLET | Freq: Every day | ORAL | Status: DC

## 2010-09-20 NOTE — Progress Notes (Signed)
  Subjective:    Patient ID: Yvonne [name], female    DOB: Oct 31, 1940, 70 y.o.   MRN: [id number]  HPI Comments: Having trouble with Ambien and not remembering things well.  States she cannot finish sentences.      Review of Systems  Constitutional: Negative for fever, chills and fatigue.  Respiratory: Negative for shortness of breath and wheezing.   Cardiovascular: Negative for chest pain.       Objective:   Physical Exam  Constitutional: She appears well-developed and well-nourished.  HENT:  Head: Normocephalic and atraumatic.  Cardiovascular: Normal rate.   Pulmonary/Chest: Effort normal.          Assessment & Plan:

## 2010-09-20 NOTE — Patient Instructions (Signed)

## 2010-11-24 ENCOUNTER — Telehealth: Payer: Self-pay | Admitting: *Deleted

## 2010-11-24 NOTE — Telephone Encounter (Signed)
Refill request received from [facility] phone [phone] and fax [phone] for Zolpidem 10 mg . Will forward to  Dr. Shawnie Pons.

## 2010-11-29 NOTE — Telephone Encounter (Signed)
Message left for patient to call back

## 2010-12-06 ENCOUNTER — Ambulatory Visit (INDEPENDENT_AMBULATORY_CARE_PROVIDER_SITE_OTHER): Payer: Medicare HMO | Admitting: Family Medicine

## 2010-12-06 ENCOUNTER — Encounter: Payer: Self-pay | Admitting: Family Medicine

## 2010-12-06 ENCOUNTER — Other Ambulatory Visit: Payer: Self-pay | Admitting: *Deleted

## 2010-12-06 DIAGNOSIS — F419 Anxiety disorder, unspecified: Secondary | ICD-10-CM

## 2010-12-06 DIAGNOSIS — G4700 Insomnia, unspecified: Secondary | ICD-10-CM

## 2010-12-06 DIAGNOSIS — F411 Generalized anxiety disorder: Secondary | ICD-10-CM

## 2010-12-06 DIAGNOSIS — I10 Essential (primary) hypertension: Secondary | ICD-10-CM

## 2010-12-06 MED ORDER — ZOLPIDEM TARTRATE 10 MG PO TABS: 10.0000 mg | ORAL_TABLET | Freq: Every evening | ORAL | Status: DC | PRN

## 2010-12-06 MED ORDER — BENAZEPRIL-HYDROCHLOROTHIAZIDE 20-25 MG PO TABS: 1.0000 | ORAL_TABLET | Freq: Every day | ORAL | Status: DC

## 2010-12-06 MED ORDER — AMITRIPTYLINE HCL 50 MG PO TABS: 50.0000 mg | ORAL_TABLET | Freq: Three times a day (TID) | ORAL | Status: DC

## 2010-12-06 NOTE — Assessment & Plan Note (Signed)
Change back to Ambien.

## 2010-12-06 NOTE — Telephone Encounter (Signed)
Patient never returned call. She is in office to see Dr. Shawnie Pons today 12/06/2010.

## 2010-12-06 NOTE — Progress Notes (Signed)
  Subjective:    Patient ID: Yvonne [name], female    DOB: June 15, 1941, 70 y.o.   MRN: [id number]  HPI Comments: Also, tried to switch to [facility], but too expensive.  Also, does not have enough $ for groceries, and is losing weight.  Hypertension This is a chronic problem. The problem has been gradually improving since onset. The problem is controlled. Pertinent negatives include no blurred vision, chest pain, malaise/fatigue or shortness of breath. There are no associated agents to hypertension. Risk factors for coronary artery disease include post-menopausal state. Past treatments include ACE inhibitors and diuretics. The current treatment provides significant improvement. There are no compliance problems.  There is no history of kidney disease. There is no history of chronic renal disease or coarctation of the aorta.      Review of Systems  Constitutional: Negative for fever, chills, malaise/fatigue, appetite change and fatigue.  HENT: Negative for congestion and rhinorrhea.   Eyes: Negative for blurred vision.  Respiratory: Negative for shortness of breath.   Cardiovascular: Negative for chest pain.  Gastrointestinal: Negative for nausea, vomiting, diarrhea and constipation.  Genitourinary: Negative for vaginal bleeding, vaginal discharge and pelvic pain.  Skin: Negative for color change.  Neurological: Negative for seizures and syncope.  Psychiatric/Behavioral: Negative for agitation.       Objective:   Physical Exam  Constitutional: She appears well-developed and well-nourished.  HENT:  Head: Normocephalic and atraumatic.  Eyes: Pupils are equal, round, and reactive to light.  Neck: Normal range of motion. Neck supple.  Cardiovascular: Normal rate and regular rhythm.   Pulmonary/Chest: Effort normal.  Abdominal: Soft.          Assessment & Plan:

## 2010-12-06 NOTE — Patient Instructions (Signed)
Anxiety and Panic Attacks Your caregiver has informed you that you are having an anxiety or panic attack. There may be many forms of this. Most of the time these attacks come suddenly and without warning. They come at any time of day, including periods of sleep, and at any time of life. They may be strong and unexplained. Although panic attacks are very scary, they are physically harmless. Sometimes the cause of your anxiety is not known. Anxiety is a protective mechanism of the body in its fight or flight mechanism. Most of these perceived danger situations are actually nonphysical situations (such as anxiety over losing a job). CAUSES The causes of an anxiety or panic attack are many. Panic attacks may occur in otherwise healthy people given a certain set of circumstances. There may be a genetic cause for panic attacks. Some medications may also have anxiety as a side effect. SYMPTOMS Some of the most common feelings are:  Intense terror.  Dizziness, feeling faint.   Hot and cold flashes.   Fear of going crazy.   Feelings that nothing is real.   Sweating.   Shaking.   Chest pain or a fast heartbeat (palpitations).  Smothering, choking sensations.   Feelings of impending doom and that death is near.   Tingling of extremities, this may be from over breathing.   Altered reality (derealization).   Being detached from yourself (depersonalization).   Several symptoms can be present to make up anxiety or panic attacks. DIAGNOSIS The evaluation by your caregiver will depend on the type of symptoms you are experiencing. The diagnosis of anxiety or pain attack is made when no physical illness can be determined to be a cause of the symptoms. TREATMENT Treatment to prevent anxiety and panic attacks may include:  Avoidance of circumstances that cause anxiety.   Reassurance and relaxation.   Regular exercise.   Relaxation therapies, such as yoga.   Psychotherapy with a psychiatrist  or therapist.   Avoidance of caffeine, alcohol and illegal drugs.   Prescribed medication.  SEEK IMMEDIATE MEDICAL CARE IF:  You experience panic attack symptoms that are different than your usual symptoms.   You have any worsening or concerning symptoms.  Document Released: 06/04/2005 Document Re-Released: 11/22/2009 [facility] Patient Information 2011 [place], [place].Insomnia Insomnia is frequent trouble falling and/or staying asleep. Insomnia can be a long term problem or a short term problem. Both are common. Insomnia can be a short term problem when the wakefulness is related to a certain stress or worry. Long term insomnia is often related to ongoing stress during waking hours and/or poor sleeping habits. Overtime, sleep deprivation itself can make the problem worse. Every little thing feels more severe because you are overtired and your ability to cope is decreased. SYMPTOMS  Not feeling rested in the morning.   Anxiety and restlessness at bedtime.   Difficulty falling and staying asleep.  CAUSES  Stress, anxiety, and depression.   Poor sleeping habits.   Distractions such as TV in the bedroom.   Naps close to bedtime.   Engaging in emotionally charged conversations before bed.   Technical reading before sleep.   Alcohol and other sedatives. They may make the problem worse. They can hurt normal sleep patterns and normal dream activity.   Stimulants such as caffeine for several hours prior to bedtime.   Pain syndromes and shortness of breath can cause insomnia.   Exercise late at night.   Changing time zones may cause sleeping problems (jet lag).  It  is sometimes helpful to have someone observe your sleeping patterns. They should look for periods of not breathing during the night (sleep apnea). They should also look to see how long those periods last. If you live alone or observers are uncertain, you can also be observed at a sleep clinic where your sleep patterns will  be professionally monitored. Sleep apnea requires a checkup and treatment. Give your caregivers your medical history. Give your caregivers observations your family has made about your sleep.  TREATMENT  Your caregiver may prescribe treatment for an underlying medical disorders. Your caregiver can give advice or help if you are using alcohol or other drugs for self-medication. Treatment of underlying problems will usually eliminate insomnia problems.   Medications can be prescribed for short time use. They are generally not recommended for lengthy use.   Over-the-counter sleep medicines are not recommended for lengthy use. They can be habit forming.   You can promote easier sleeping by making lifestyle changes such as:   Using relaxation techniques that help with breathing and reduce muscle tension.   Exercising earlier in the day.   Changing your diet and the time of your last meal. No night time snacks.   Establish a regular time to go to bed.   Counseling can help with stressful problems and worry.   Soothing music and white noise may be helpful if there are background noises you cannot remove.   Stop tedious detailed work at least one hour before bedtime.  HOME CARE INSTRUCTIONS  Keep a diary. Inform your caregiver about your progress. This includes any medication side effects. See your caregiver regularly. Take note of:   Times when you are asleep.  Times when you are awake during the night.   The quality of your sleep.  How you feel the next day.   This information will help your caregiver care for you.  Get out of bed if you are still awake after 15 minutes. Read or do some quiet activity. Keep the lights down. Wait until you feel sleepy and go back to bed.   Keep regular sleeping and waking hours. Avoid naps.   Exercise regularly.   Avoid distractions at bedtime. Distractions include watching television or engaging in any intense or detailed activity like attempting  to balance the household checkbook.   Develop a bedtime ritual. Keep a familiar routine of bathing, brushing your teeth, climbing into bed at the same time each night, listening to soothing music. Routines increase the success of falling to sleep faster.   Use relaxation techniques. This can be using breathing and muscle tension release routines. It can also include visualizing peaceful scenes. You can also help control troubling or intruding thoughts by keeping your mind occupied with boring or repetitive thoughts like the old concept of counting sheep. You can make it more creative like imagining planting one beautiful flower after another in your backyard garden.   During your day, work to eliminate stress. When this is not possible use some of the previous suggestions to help reduce the anxiety that accompanies stressful situations.  MAKE SURE YOU:   Understand these instructions.   Will watch your condition.   Will get help right away if you are not doing well or get worse.  Document Released: 06/01/2000 Document Re-Released: 05/17/2008 [facility] Patient Information 2011 [place], [place].Hypertension (High Blood Pressure) As your heart beats, it forces blood through your arteries. This force is your blood pressure. If the pressure is too high, it is  called hypertension (HTN) or high blood pressure. HTN is dangerous because you may have it and not know it. High blood pressure may mean that your heart has to work harder to pump blood. Your arteries may be narrow or stiff. The extra work puts you at risk for heart disease, stroke, and other problems.  Blood pressure consists of two numbers, a higher number over a lower, 110/72, for example. It is stated as "110 over 72." The ideal is below 120 for the top number (systolic) and under 80 for the bottom (diastolic). Write down your blood pressure today. You should pay close attention to your blood pressure if you have certain conditions such  as:  Heart failure.  Prior heart attack.   Diabetes   Chronic kidney disease.   Prior stroke.   Multiple risk factors for heart disease.   To see if you have HTN, your blood pressure should be measured while you are seated with your arm held at the level of the heart. It should be measured at least twice. A one-time elevated blood pressure reading (especially in the Emergency Department) does not mean that you need treatment. There may be conditions in which the blood pressure is different between your right and left arms. It is important to see your caregiver soon for a recheck. Most people have essential hypertension which means that there is not a specific cause. This type of high blood pressure may be lowered by changing lifestyle factors such as:  Stress.  Smoking.   Lack of exercise.   Excessive weight.  Drug/tobacco/alcohol use.   Eating less salt.   Most people do not have symptoms from high blood pressure until it has caused damage to the body. Effective treatment can often prevent, delay or reduce that damage. TREATMENT Treatment for high blood pressure, when a cause has been identified, is directed at the cause. There are a large number of medications to treat HTN. These fall into several categories, and your caregiver will help you select the medicines that are best for you. Medications may have side effects. You should review side effects with your caregiver. If your blood pressure stays high after you have made lifestyle changes or started on medicines,   Your medication(s) may need to be changed.   Other problems may need to be addressed.   Be certain you understand your prescriptions, and know how and when to take your medicine.   Be sure to follow up with your caregiver within the time frame advised (usually within two weeks) to have your blood pressure rechecked and to review your medications.   If you are taking more than one medicine to lower your blood  pressure, make sure you know how and at what times they should be taken. Taking two medicines at the same time can result in blood pressure that is too low.  SEEK IMMEDIATE MEDICAL CARE IF YOU DEVELOP:  A severe headache, blurred or changing vision, or confusion.   Unusual weakness or numbness, or a faint feeling.   Severe chest or abdominal pain, vomiting, or breathing problems.  MAKE SURE YOU:   Understand these instructions.   Will watch your condition.   Will get help right away if you are not doing well or get worse.  Document Released: 06/04/2005 Document Re-Released: 11/22/2009 [facility] Patient Information 2011 [place], [place].

## 2010-12-06 NOTE — Assessment & Plan Note (Signed)
Continue to watch weight and salt intake.  Continue current regimen.

## 2011-02-05 ENCOUNTER — Encounter: Payer: Self-pay | Admitting: Family Medicine

## 2011-02-05 ENCOUNTER — Ambulatory Visit (INDEPENDENT_AMBULATORY_CARE_PROVIDER_SITE_OTHER): Payer: Medicare HMO | Admitting: Family Medicine

## 2011-02-05 DIAGNOSIS — I10 Essential (primary) hypertension: Secondary | ICD-10-CM

## 2011-02-05 DIAGNOSIS — G4700 Insomnia, unspecified: Secondary | ICD-10-CM

## 2011-02-05 DIAGNOSIS — F411 Generalized anxiety disorder: Secondary | ICD-10-CM

## 2011-02-05 DIAGNOSIS — E039 Hypothyroidism, unspecified: Secondary | ICD-10-CM

## 2011-02-05 DIAGNOSIS — R928 Other abnormal and inconclusive findings on diagnostic imaging of breast: Secondary | ICD-10-CM

## 2011-02-05 DIAGNOSIS — R011 Cardiac murmur, unspecified: Secondary | ICD-10-CM

## 2011-02-05 DIAGNOSIS — F419 Anxiety disorder, unspecified: Secondary | ICD-10-CM

## 2011-02-05 MED ORDER — LEVOTHYROXINE SODIUM 25 MCG PO TABS: 25.0000 ug | ORAL_TABLET | Freq: Every day | ORAL | Status: DC

## 2011-02-05 MED ORDER — ZOLPIDEM TARTRATE 10 MG PO TABS: 5.0000 mg | ORAL_TABLET | Freq: Every evening | ORAL | Status: DC | PRN

## 2011-02-05 MED ORDER — BENAZEPRIL-HYDROCHLOROTHIAZIDE 20-25 MG PO TABS: 1.0000 | ORAL_TABLET | Freq: Every day | ORAL | Status: DC

## 2011-02-05 MED ORDER — AMITRIPTYLINE HCL 50 MG PO TABS: 50.0000 mg | ORAL_TABLET | Freq: Every day | ORAL | Status: DC

## 2011-02-05 NOTE — Assessment & Plan Note (Signed)
Been present for last 20 years, unsure if she's seen cardiologist.

## 2011-02-05 NOTE — Progress Notes (Signed)
  Subjective:    Patient ID: Yvonne [name], female    DOB: 1940/12/19, 70 y.o.   MRN: [id number]  HPI Comments: Reports generally that she is feeling well.  Has decreased her amitriptyline and ambien.  She is now on 1 elavil daily and 1/2 tab of ambien.  Her anxiety is improved.  Hypertension This is a chronic problem. The current episode started more than 1 year ago. The problem has been gradually worsening since onset. The problem is controlled (unitl today). Pertinent negatives include no anxiety, blurred vision, chest pain, neck pain, peripheral edema or shortness of breath. Agents associated with hypertension include thyroid hormones. Risk factors for coronary artery disease include post-menopausal state. Past treatments include diuretics and angiotensin blockers. The current treatment provides mild improvement. There are no compliance problems.  Hypertensive end-organ damage includes CVA.      Review of Systems  Constitutional: Negative for fever, chills, diaphoresis, activity change and fatigue.  HENT: Negative for nosebleeds, facial swelling and neck pain.   Eyes: Negative for blurred vision.  Respiratory: Negative for shortness of breath and wheezing.   Cardiovascular: Negative for chest pain and leg swelling.  Gastrointestinal: Negative for abdominal pain and abdominal distention.  Genitourinary: Negative for dysuria and urgency.  Musculoskeletal: Negative for back pain and arthralgias.  Skin: Negative for color change and rash.  Neurological: Negative for dizziness.  Psychiatric/Behavioral: Negative for confusion and agitation.       Objective:   Physical Exam  Vitals reviewed. Constitutional: She is oriented to person, place, and time. She appears well-developed and well-nourished.  HENT:  Head: Normocephalic and atraumatic.  Neck: Normal range of motion.  Cardiovascular: Normal rate and regular rhythm.   Murmur (systolic) heard. Pulmonary/Chest: Effort normal and  breath sounds normal.  Abdominal: Soft. There is no tenderness.  Neurological: She is alert and oriented to person, place, and time.  Skin: Skin is warm and dry.  Psychiatric: She has a normal mood and affect.          Assessment & Plan:  See problem a\based assessment and plan.

## 2011-02-05 NOTE — Patient Instructions (Signed)
Insomnia Insomnia is frequent trouble falling and/or staying asleep. Insomnia can be a long term problem or a short term problem. Both are common. Insomnia can be a short term problem when the wakefulness is related to a certain stress or worry. Long term insomnia is often related to ongoing stress during waking hours and/or poor sleeping habits. Overtime, sleep deprivation itself can make the problem worse. Every little thing feels more severe because you are overtired and your ability to cope is decreased. SYMPTOMS  Not feeling rested in the morning.   Anxiety and restlessness at bedtime.   Difficulty falling and staying asleep.  CAUSES  Stress, anxiety, and depression.   Poor sleeping habits.   Distractions such as TV in the bedroom.   Naps close to bedtime.   Engaging in emotionally charged conversations before bed.   Technical reading before sleep.   Alcohol and other sedatives. They may make the problem worse. They can hurt normal sleep patterns and normal dream activity.   Stimulants such as caffeine for several hours prior to bedtime.   Pain syndromes and shortness of breath can cause insomnia.   Exercise late at night.   Changing time zones may cause sleeping problems (jet lag).  It is sometimes helpful to have someone observe your sleeping patterns. They should look for periods of not breathing during the night (sleep apnea). They should also look to see how long those periods last. If you live alone or observers are uncertain, you can also be observed at a sleep clinic where your sleep patterns will be professionally monitored. Sleep apnea requires a checkup and treatment. Give your caregivers your medical history. Give your caregivers observations your family has made about your sleep.  TREATMENT  Your caregiver may prescribe treatment for an underlying medical disorders. Your caregiver can give advice or help if you are using alcohol or other drugs for self-medication.  Treatment of underlying problems will usually eliminate insomnia problems.   Medications can be prescribed for short time use. They are generally not recommended for lengthy use.   Over-the-counter sleep medicines are not recommended for lengthy use. They can be habit forming.   You can promote easier sleeping by making lifestyle changes such as:   Using relaxation techniques that help with breathing and reduce muscle tension.   Exercising earlier in the day.   Changing your diet and the time of your last meal. No night time snacks.   Establish a regular time to go to bed.   Counseling can help with stressful problems and worry.   Soothing music and white noise may be helpful if there are background noises you cannot remove.   Stop tedious detailed work at least one hour before bedtime.  HOME CARE INSTRUCTIONS  Keep a diary. Inform your caregiver about your progress. This includes any medication side effects. See your caregiver regularly. Take note of:   Times when you are asleep.  Times when you are awake during the night.   The quality of your sleep.  How you feel the next day.   This information will help your caregiver care for you.  Get out of bed if you are still awake after 15 minutes. Read or do some quiet activity. Keep the lights down. Wait until you feel sleepy and go back to bed.   Keep regular sleeping and waking hours. Avoid naps.   Exercise regularly.   Avoid distractions at bedtime. Distractions include watching television or engaging in any intense or detailed  activity like attempting to balance the household checkbook.   Develop a bedtime ritual. Keep a familiar routine of bathing, brushing your teeth, climbing into bed at the same time each night, listening to soothing music. Routines increase the success of falling to sleep faster.   Use relaxation techniques. This can be using breathing and muscle tension release routines. It can also include  visualizing peaceful scenes. You can also help control troubling or intruding thoughts by keeping your mind occupied with boring or repetitive thoughts like the old concept of counting sheep. You can make it more creative like imagining planting one beautiful flower after another in your backyard garden.   During your day, work to eliminate stress. When this is not possible use some of the previous suggestions to help reduce the anxiety that accompanies stressful situations.  MAKE SURE YOU:   Understand these instructions.   Will watch your condition.   Will get help right away if you are not doing well or get worse.  Document Released: 06/01/2000 Document Re-Released: 05/17/2008 [facility] Patient Information 2011 [place], [place].Hypertension (High Blood Pressure) As your heart beats, it forces blood through your arteries. This force is your blood pressure. If the pressure is too high, it is called hypertension (HTN) or high blood pressure. HTN is dangerous because you may have it and not know it. High blood pressure may mean that your heart has to work harder to pump blood. Your arteries may be narrow or stiff. The extra work puts you at risk for heart disease, stroke, and other problems.  Blood pressure consists of two numbers, a higher number over a lower, 110/72, for example. It is stated as "110 over 72." The ideal is below 120 for the top number (systolic) and under 80 for the bottom (diastolic). Write down your blood pressure today. You should pay close attention to your blood pressure if you have certain conditions such as:  Heart failure.  Prior heart attack.   Diabetes   Chronic kidney disease.   Prior stroke.   Multiple risk factors for heart disease.   To see if you have HTN, your blood pressure should be measured while you are seated with your arm held at the level of the heart. It should be measured at least twice. A one-time elevated blood pressure reading (especially in the  Emergency Department) does not mean that you need treatment. There may be conditions in which the blood pressure is different between your right and left arms. It is important to see your caregiver soon for a recheck. Most people have essential hypertension which means that there is not a specific cause. This type of high blood pressure may be lowered by changing lifestyle factors such as:  Stress.  Smoking.   Lack of exercise.   Excessive weight.  Drug/tobacco/alcohol use.   Eating less salt.   Most people do not have symptoms from high blood pressure until it has caused damage to the body. Effective treatment can often prevent, delay or reduce that damage. TREATMENT Treatment for high blood pressure, when a cause has been identified, is directed at the cause. There are a large number of medications to treat HTN. These fall into several categories, and your caregiver will help you select the medicines that are best for you. Medications may have side effects. You should review side effects with your caregiver. If your blood pressure stays high after you have made lifestyle changes or started on medicines,   Your medication(s) may need to  be changed.   Other problems may need to be addressed.   Be certain you understand your prescriptions, and know how and when to take your medicine.   Be sure to follow up with your caregiver within the time frame advised (usually within two weeks) to have your blood pressure rechecked and to review your medications.   If you are taking more than one medicine to lower your blood pressure, make sure you know how and at what times they should be taken. Taking two medicines at the same time can result in blood pressure that is too low.  SEEK IMMEDIATE MEDICAL CARE IF YOU DEVELOP:  A severe headache, blurred or changing vision, or confusion.   Unusual weakness or numbness, or a faint feeling.   Severe chest or abdominal pain, vomiting, or breathing  problems.  MAKE SURE YOU:   Understand these instructions.   Will watch your condition.   Will get help right away if you are not doing well or get worse.  Document Released: 06/04/2005 Document Re-Released: 11/22/2009 [facility] Patient Information 2011 [place], [place].

## 2011-02-05 NOTE — Assessment & Plan Note (Signed)
Improved

## 2011-02-05 NOTE — Assessment & Plan Note (Signed)
Check TSH next visit. 

## 2011-02-05 NOTE — Assessment & Plan Note (Signed)
Is not well controlled today, but has been recently--Reports she took meds today.  Continue to follow.  Written inst. Given.

## 2011-02-05 NOTE — Assessment & Plan Note (Signed)
Improved, down to 1/2 ambien at hs.

## 2011-03-13 ENCOUNTER — Encounter: Payer: Self-pay | Admitting: Family Medicine

## 2011-04-12 ENCOUNTER — Ambulatory Visit (INDEPENDENT_AMBULATORY_CARE_PROVIDER_SITE_OTHER): Payer: Medicare HMO | Admitting: Family Medicine

## 2011-04-12 ENCOUNTER — Encounter: Payer: Self-pay | Admitting: Family Medicine

## 2011-04-12 DIAGNOSIS — M1990 Unspecified osteoarthritis, unspecified site: Secondary | ICD-10-CM | POA: Insufficient documentation

## 2011-04-12 DIAGNOSIS — I10 Essential (primary) hypertension: Secondary | ICD-10-CM

## 2011-04-12 DIAGNOSIS — G4700 Insomnia, unspecified: Secondary | ICD-10-CM

## 2011-04-12 DIAGNOSIS — Z23 Encounter for immunization: Secondary | ICD-10-CM

## 2011-04-12 MED ORDER — ZOLPIDEM TARTRATE 10 MG PO TABS: 5.0000 mg | ORAL_TABLET | Freq: Every evening | ORAL | Status: DC | PRN

## 2011-04-12 MED ORDER — PAROXETINE HCL 10 MG PO TABS: 10.0000 mg | ORAL_TABLET | Freq: Every day | ORAL | Status: DC

## 2011-04-12 MED ORDER — FLUOXETINE HCL 10 MG PO CAPS: 10.0000 mg | ORAL_CAPSULE | Freq: Every day | ORAL | Status: DC

## 2011-04-12 NOTE — Progress Notes (Signed)
  Subjective:    Patient ID: Yvonne [name], female    DOB: Mar 01, 1941, 70 y.o.   MRN: [id number]  HPI Comments: Also, wants to see a specialist about her arthritis.  Has pain in joints of fingers at times. Reports feeling down since dropping her amitriptyline from 150 mg to 50. Agrees to TDaP today.  Had flu shot. Declines Zoster. After moving furniture around last week, her hand became black and blue, this has resolved.  Hypertension This is a chronic problem. The current episode started more than 1 year ago. The problem is unchanged. The problem is controlled. Pertinent negatives include no chest pain, headaches, palpitations or shortness of breath. Agents associated with hypertension include decongestants. Risk factors for coronary artery disease include post-menopausal state. Past treatments include ACE inhibitors and diuretics. The current treatment provides mild improvement. There are no compliance problems.       Review of Systems  Constitutional: Negative for activity change and fatigue.  HENT: Negative for hearing loss and congestion.   Respiratory: Negative for choking, chest tightness and shortness of breath.   Cardiovascular: Negative for chest pain, palpitations and leg swelling.  Gastrointestinal: Negative for nausea, abdominal pain, diarrhea, constipation and abdominal distention.  Genitourinary: Negative for dysuria, hematuria and flank pain.  Musculoskeletal: Positive for arthralgias. Negative for myalgias, back pain and joint swelling.  Skin: Negative for rash.  Neurological: Negative for tremors, weakness and headaches.  Psychiatric/Behavioral: Negative for behavioral problems and agitation.       Objective:   Physical Exam  Vitals reviewed. Constitutional: She appears well-developed and well-nourished.  HENT:  Head: Normocephalic and atraumatic.  Neck: Normal range of motion. Neck supple. Carotid bruit is not present.  Cardiovascular: Normal rate and regular  rhythm.   Murmur heard. Pulmonary/Chest: Effort normal and breath sounds normal.  Abdominal: Soft. Bowel sounds are normal. There is no tenderness.          Assessment & Plan:  On Phenylephrine today which likely increased BP. Refilled meds. Addition of SSRI.

## 2011-04-12 NOTE — Patient Instructions (Signed)
Hypertension As your heart beats, it forces blood through your arteries. This force is your blood pressure. If the pressure is too high, it is called hypertension (HTN) or high blood pressure. HTN is dangerous because you may have it and not know it. High blood pressure may mean that your heart has to work harder to pump blood. Your arteries may be narrow or stiff. The extra work puts you at risk for heart disease, stroke, and other problems.  Blood pressure consists of two numbers, a higher number over a lower, 110/72, for example. It is stated as "110 over 72." The ideal is below 120 for the top number (systolic) and under 80 for the bottom (diastolic). Write down your blood pressure today. You should pay close attention to your blood pressure if you have certain conditions such as:  Heart failure.   Prior heart attack.   Diabetes   Chronic kidney disease.   Prior stroke.   Multiple risk factors for heart disease.  To see if you have HTN, your blood pressure should be measured while you are seated with your arm held at the level of the heart. It should be measured at least twice. A one-time elevated blood pressure reading (especially in the Emergency Department) does not mean that you need treatment. There may be conditions in which the blood pressure is different between your right and left arms. It is important to see your caregiver soon for a recheck. Most people have essential hypertension which means that there is not a specific cause. This type of high blood pressure may be lowered by changing lifestyle factors such as:  Stress.   Smoking.   Lack of exercise.   Excessive weight.   Drug/tobacco/alcohol use.   Eating less salt.  Most people do not have symptoms from high blood pressure until it has caused damage to the body. Effective treatment can often prevent, delay or reduce that damage. TREATMENT  When a cause has been identified, treatment for high blood pressure is  directed at the cause. There are a large number of medications to treat HTN. These fall into several categories, and your caregiver will help you select the medicines that are best for you. Medications may have side effects. You should review side effects with your caregiver. If your blood pressure stays high after you have made lifestyle changes or started on medicines,   Your medication(s) may need to be changed.   Other problems may need to be addressed.   Be certain you understand your prescriptions, and know how and when to take your medicine.   Be sure to follow up with your caregiver within the time frame advised (usually within two weeks) to have your blood pressure rechecked and to review your medications.   If you are taking more than one medicine to lower your blood pressure, make sure you know how and at what times they should be taken. Taking two medicines at the same time can result in blood pressure that is too low.  SEEK IMMEDIATE MEDICAL CARE IF:  You develop a severe headache, blurred or changing vision, or confusion.   You have unusual weakness or numbness, or a faint feeling.   You have severe chest or abdominal pain, vomiting, or breathing problems.  MAKE SURE YOU:   Understand these instructions.   Will watch your condition.   Will get help right away if you are not doing well or get worse.  Document Released: 06/04/2005 Document Revised: 02/14/2011 Document Reviewed:   01/23/2008 [employer] Patient Information 2012 [place], [place].Insomnia Insomnia is frequent trouble falling and/or staying asleep. Insomnia can be a long term problem or a short term problem. Both are common. Insomnia can be a short term problem when the wakefulness is related to a certain stress or worry. Long term insomnia is often related to ongoing stress during waking hours and/or poor sleeping habits. Overtime, sleep deprivation itself can make the problem worse. Every little thing feels more  severe because you are overtired and your ability to cope is decreased. CAUSES   Stress, anxiety, and depression.   Poor sleeping habits.   Distractions such as TV in the bedroom.   Naps close to bedtime.   Engaging in emotionally charged conversations before bed.   Technical reading before sleep.   Alcohol and other sedatives. They may make the problem worse. They can hurt normal sleep patterns and normal dream activity.   Stimulants such as caffeine for several hours prior to bedtime.   Pain syndromes and shortness of breath can cause insomnia.   Exercise late at night.   Changing time zones may cause sleeping problems (jet lag).  It is sometimes helpful to have someone observe your sleeping patterns. They should look for periods of not breathing during the night (sleep apnea). They should also look to see how long those periods last. If you live alone or observers are uncertain, you can also be observed at a sleep clinic where your sleep patterns will be professionally monitored. Sleep apnea requires a checkup and treatment. Give your caregivers your medical history. Give your caregivers observations your family has made about your sleep.  SYMPTOMS   Not feeling rested in the morning.   Anxiety and restlessness at bedtime.   Difficulty falling and staying asleep.  TREATMENT   Your caregiver may prescribe treatment for an underlying medical disorders. Your caregiver can give advice or help if you are using alcohol or other drugs for self-medication. Treatment of underlying problems will usually eliminate insomnia problems.   Medications can be prescribed for short time use. They are generally not recommended for lengthy use.   Over-the-counter sleep medicines are not recommended for lengthy use. They can be habit forming.   You can promote easier sleeping by making lifestyle changes such as:   Using relaxation techniques that help with breathing and reduce muscle tension.     Exercising earlier in the day.   Changing your diet and the time of your last meal. No night time snacks.   Establish a regular time to go to bed.   Counseling can help with stressful problems and worry.   Soothing music and white noise may be helpful if there are background noises you cannot remove.   Stop tedious detailed work at least one hour before bedtime.  HOME CARE INSTRUCTIONS   Keep a diary. Inform your caregiver about your progress. This includes any medication side effects. See your caregiver regularly. Take note of:   Times when you are asleep.   Times when you are awake during the night.   The quality of your sleep.   How you feel the next day.  This information will help your caregiver care for you.  Get out of bed if you are still awake after 15 minutes. Read or do some quiet activity. Keep the lights down. Wait until you feel sleepy and go back to bed.   Keep regular sleeping and waking hours. Avoid naps.   Exercise regularly.   Avoid distractions  at bedtime. Distractions include watching television or engaging in any intense or detailed activity like attempting to balance the household checkbook.   Develop a bedtime ritual. Keep a familiar routine of bathing, brushing your teeth, climbing into bed at the same time each night, listening to soothing music. Routines increase the success of falling to sleep faster.   Use relaxation techniques. This can be using breathing and muscle tension release routines. It can also include visualizing peaceful scenes. You can also help control troubling or intruding thoughts by keeping your mind occupied with boring or repetitive thoughts like the old concept of counting sheep. You can make it more creative like imagining planting one beautiful flower after another in your backyard garden.   During your day, work to eliminate stress. When this is not possible use some of the previous suggestions to help reduce the anxiety  that accompanies stressful situations.  MAKE SURE YOU:   Understand these instructions.   Will watch your condition.   Will get help right away if you are not doing well or get worse.  Document Released: 06/01/2000 Document Revised: 02/14/2011 Document Reviewed: 07/02/2007 [facility] Patient Information 2012 [place], [place].

## 2011-04-16 ENCOUNTER — Telehealth: Payer: Self-pay | Admitting: Family Medicine

## 2011-04-16 NOTE — Telephone Encounter (Signed)
Ms. Khatib is calling to let Dr. Shawnie Pons know she had been to the [facility] and they will be doing a study on her arthritis.  The study will be starting in about a week.

## 2011-04-25 ENCOUNTER — Encounter: Payer: Self-pay | Admitting: Home Health Services

## 2011-05-04 ENCOUNTER — Encounter: Payer: Self-pay | Admitting: Home Health Services

## 2011-05-04 ENCOUNTER — Ambulatory Visit (INDEPENDENT_AMBULATORY_CARE_PROVIDER_SITE_OTHER): Payer: Medicare HMO | Admitting: Home Health Services

## 2011-05-04 ENCOUNTER — Telehealth: Payer: Self-pay | Admitting: Family Medicine

## 2011-05-04 VITALS — BP 142/85 | HR 79 | Temp 98.4°F | Ht 62.0 in | Wt 145.5 lb

## 2011-05-04 DIAGNOSIS — G4700 Insomnia, unspecified: Secondary | ICD-10-CM

## 2011-05-04 DIAGNOSIS — Z0000 Encounter for general adult medical examination without abnormal findings: Secondary | ICD-10-CM

## 2011-05-04 DIAGNOSIS — Z23 Encounter for immunization: Secondary | ICD-10-CM

## 2011-05-04 NOTE — Progress Notes (Signed)
Patient here for annual wellness visit, patient reports: Risk Factors/Conditions needing evaluation or treatment: Pt stopped taking paxil 5 days ago. May need to discuss with PCP. Home Safety: Pt lives by self in 1 story home.  Pt reports having smoke detectors and adaptive equipment in bathroom. 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 Dentures: Pt has upper plate and doesn't visit dentist regularly. Memory: Pt reports some memory problems. Patient's Mini Mental Score (recorded in doc. flowsheet): 27 Pt failed hearing screening, we discussed scheduling appointment with audiologist.   Balance/Gait:  Balance Abnormal Patient value  Sitting balance    Sit to stand    Attempts to arise    Immediate standing balance    Standing balance    Nudge    Eyes closed- Romberg    Tandem stance    Back lean    Neck Rotation    360 degree turn    Sitting down     Gait Abnormal Patient value  Initiation of gait    Step length-left    Step length-right    Step height-left    Step height-right    Step symmetry    Step continuity    Path deviation    Trunk movement  Little ridgid  Walking stance        Annual Wellness Visit Requirements Recorded Today In  Medical, family, social history Past Medical, Family, Social History Section  Current providers Care team  Current medications Medications  Wt, BP, Ht, BMI Vital signs  Hearing assessment (welcome visit) Hearing/vision  Tobacco, alcohol, illicit drug use History  ADL Nurse Assessment  Depression Screening Nurse Assessment  Cognitive impairment Nurse Assessment  Mini Mental Status Document Flowsheet  Fall Risk Nurse Assessment  Home Safety Progress Note  End of Life Planning (welcome visit) Social Documentation  Medicare preventative services Progress Note  Risk factors/conditions needing evaluation/treatment Progress Note  Personalized health advice Patient  Instructions, goals, letter  Diet & Exercise Social Documentation  Emergency Contact Social Documentation  Seat Belts Social Documentation  Sun exposure/protection Social Documentation    Prevention Plan:   Recommended Medicare Prevention Screenings Women over 65 Test For Frequency Date of Last- BOLD if needed  Breast Cancer 1-2 yrs 11/11  Cervical Cancer 1-3 yrs Not indicated  Colorectal Cancer 1-10 yrs 4/11  Osteoporosis once discussed  Cholesterol 5 yrs 1/11  Diabetes yearly 1/11  HIV yearly declined  Influenza Shot yearly 8/12  Pneumonia Shot once 11/12  Zostavax Shot once discussed

## 2011-05-04 NOTE — Patient Instructions (Signed)
1. Continue to exercise for 20 minutes daily. 2. Continue to focus on eating fruits, vegetables, whole grain fibers. 3. Contact Dr. Shawnie Pons if you feel sad or blue longer then 2 weeks. 4. Complete living will and bring in copy for Dr. Shawnie Pons.  5. Consider scheduling visit with hearing doctor.  6. Continue to take medications as prescribed.

## 2011-05-04 NOTE — Telephone Encounter (Signed)
Yvonne [name] came in and would like to request a rx change from Ambien to Ambien C.R (time-release). Stated wanted rx sent to [facility] [phone]. Pls call pt with concern.

## 2011-05-04 NOTE — Progress Notes (Addendum)
Agree with findings and documentation of Yvonne [name].

## 2011-05-09 MED ORDER — ZOLPIDEM TARTRATE ER 12.5 MG PO TBCR: 12.5000 mg | EXTENDED_RELEASE_TABLET | Freq: Every evening | ORAL | Status: DC | PRN

## 2011-05-17 ENCOUNTER — Telehealth: Payer: Self-pay | Admitting: Family Medicine

## 2011-05-17 NOTE — Telephone Encounter (Signed)
Ms. Shimp is calling because her Pharmacy has sent several refill requests for Ambien-CR and has not gotten an answer.

## 2011-05-17 NOTE — Telephone Encounter (Signed)
Received request from [facility] regarding  Zolpidem ER for change to formulary alternative , Zolpidem 5 or 10 mg. . Otherwise we will need to do a PA for the Zopidem ER.

## 2011-05-17 NOTE — Telephone Encounter (Signed)
Dr. Shawnie Pons,  It looks like this script was printed.  Do you remember where it was placed or sent from there?

## 2011-05-18 ENCOUNTER — Encounter: Payer: Self-pay | Admitting: *Deleted

## 2011-05-18 NOTE — Telephone Encounter (Signed)
This pt. Asked for this change--She had regular ambien ordered?  I printed this out and gave to a nurse on blue hall on last Friday.

## 2011-05-18 NOTE — Telephone Encounter (Signed)
This encounter was created in error - please disregard.

## 2011-05-18 NOTE — Telephone Encounter (Signed)
The Rx that was given, according to med list was zolpidem CR 12.5 mg .  This requires PA. You will need to fill out form.  The plain zolpidem 5 or 10 mg does not require PA.

## 2011-06-19 HISTORY — PX: TOE AMPUTATION: SHX809

## 2011-07-25 ENCOUNTER — Encounter: Payer: Self-pay | Admitting: Family Medicine

## 2011-07-25 ENCOUNTER — Ambulatory Visit (INDEPENDENT_AMBULATORY_CARE_PROVIDER_SITE_OTHER): Payer: Medicare HMO | Admitting: Family Medicine

## 2011-07-25 DIAGNOSIS — Z8679 Personal history of other diseases of the circulatory system: Secondary | ICD-10-CM

## 2011-07-25 DIAGNOSIS — E039 Hypothyroidism, unspecified: Secondary | ICD-10-CM

## 2011-07-25 DIAGNOSIS — R011 Cardiac murmur, unspecified: Secondary | ICD-10-CM

## 2011-07-25 DIAGNOSIS — G4700 Insomnia, unspecified: Secondary | ICD-10-CM

## 2011-07-25 DIAGNOSIS — F411 Generalized anxiety disorder: Secondary | ICD-10-CM

## 2011-07-25 DIAGNOSIS — I10 Essential (primary) hypertension: Secondary | ICD-10-CM

## 2011-07-25 DIAGNOSIS — F419 Anxiety disorder, unspecified: Secondary | ICD-10-CM

## 2011-07-25 LAB — COMPREHENSIVE METABOLIC PANEL
CO2: 25 mEq/L (ref 19–32)
Calcium: 9.7 mg/dL (ref 8.4–10.5)
Chloride: 101 mEq/L (ref 96–112)
Glucose, Bld: 105 mg/dL — ABNORMAL HIGH (ref 70–99)
Sodium: 138 mEq/L (ref 135–145)
Total Bilirubin: 0.4 mg/dL (ref 0.3–1.2)
Total Protein: 7.4 g/dL (ref 6.0–8.3)

## 2011-07-25 LAB — TSH: TSH: 4.887 u[IU]/mL — ABNORMAL HIGH (ref 0.350–4.500)

## 2011-07-25 LAB — CBC
Platelets: 414 10*3/uL — ABNORMAL HIGH (ref 150–400)
RBC: 4.75 MIL/uL (ref 3.87–5.11)
WBC: 8.6 10*3/uL (ref 4.0–10.5)

## 2011-07-25 LAB — LIPID PANEL
HDL: 52 mg/dL (ref >39)
Triglycerides: 215 mg/dL — ABNORMAL HIGH (ref <150)

## 2011-07-25 MED ORDER — ZOLPIDEM TARTRATE 10 MG PO TABS: 10.0000 mg | ORAL_TABLET | Freq: Every evening | ORAL | Status: DC | PRN

## 2011-07-25 MED ORDER — AMITRIPTYLINE HCL 50 MG PO TABS: 100.0000 mg | ORAL_TABLET | Freq: Every day | ORAL | Status: DC

## 2011-07-25 MED ORDER — CALCIUM CARBONATE-VITAMIN D 500-200 MG-UNIT PO TABS: 2.0000 | ORAL_TABLET | Freq: Every day | ORAL | Status: DC

## 2011-07-25 NOTE — Patient Instructions (Signed)
Hypertension As your heart beats, it forces blood through your arteries. This force is your blood pressure. If the pressure is too high, it is called hypertension (HTN) or high blood pressure. HTN is dangerous because you may have it and not know it. High blood pressure may mean that your heart has to work harder to pump blood. Your arteries may be narrow or stiff. The extra work puts you at risk for heart disease, stroke, and other problems.  Blood pressure consists of two numbers, a higher number over a lower, 110/72, for example. It is stated as "110 over 72." The ideal is below 120 for the top number (systolic) and under 80 for the bottom (diastolic). Write down your blood pressure today. You should pay close attention to your blood pressure if you have certain conditions such as:  Heart failure.   Prior heart attack.   Diabetes   Chronic kidney disease.   Prior stroke.   Multiple risk factors for heart disease.  To see if you have HTN, your blood pressure should be measured while you are seated with your arm held at the level of the heart. It should be measured at least twice. A one-time elevated blood pressure reading (especially in the Emergency Department) does not mean that you need treatment. There may be conditions in which the blood pressure is different between your right and left arms. It is important to see your caregiver soon for a recheck. Most people have essential hypertension which means that there is not a specific cause. This type of high blood pressure may be lowered by changing lifestyle factors such as:  Stress.   Smoking.   Lack of exercise.   Excessive weight.   Drug/tobacco/alcohol use.   Eating less salt.  Most people do not have symptoms from high blood pressure until it has caused damage to the body. Effective treatment can often prevent, delay or reduce that damage. TREATMENT  When a cause has been identified, treatment for high blood pressure is  directed at the cause. There are a large number of medications to treat HTN. These fall into several categories, and your caregiver will help you select the medicines that are best for you. Medications may have side effects. You should review side effects with your caregiver. If your blood pressure stays high after you have made lifestyle changes or started on medicines,   Your medication(s) may need to be changed.   Other problems may need to be addressed.   Be certain you understand your prescriptions, and know how and when to take your medicine.   Be sure to follow up with your caregiver within the time frame advised (usually within two weeks) to have your blood pressure rechecked and to review your medications.   If you are taking more than one medicine to lower your blood pressure, make sure you know how and at what times they should be taken. Taking two medicines at the same time can result in blood pressure that is too low.  SEEK IMMEDIATE MEDICAL CARE IF:  You develop a severe headache, blurred or changing vision, or confusion.   You have unusual weakness or numbness, or a faint feeling.   You have severe chest or abdominal pain, vomiting, or breathing problems.  MAKE SURE YOU:   Understand these instructions.   Will watch your condition.   Will get help right away if you are not doing well or get worse.  Document Released: 06/04/2005 Document Revised: 02/14/2011 Document Reviewed:   01/23/2008 [employer] Patient Information 2012 [place], [place].Hypothyroidism The thyroid is a large gland located in the lower front of your neck. The thyroid gland helps control metabolism. Metabolism is how your body handles food. It controls metabolism with the hormone thyroxine. When this gland is underactive (hypothyroid), it produces too little hormone.  CAUSES These include:   Absence or destruction of thyroid tissue.   Goiter due to iodine deficiency.   Goiter due to medications.    Congenital defects (since birth).   Problems with the pituitary. This causes a lack of TSH (thyroid stimulating hormone). This hormone tells the thyroid to turn out more hormone.  SYMPTOMS  Lethargy (feeling as though you have no energy)   Cold intolerance   Weight gain (in spite of normal food intake)   Dry skin   Coarse hair   Menstrual irregularity (if severe, may lead to infertility)   Slowing of thought processes  Cardiac problems are also caused by insufficient amounts of thyroid hormone. Hypothyroidism in the newborn is cretinism, and is an extreme form. It is important that this form be treated adequately and immediately or it will lead rapidly to retarded physical and mental development. DIAGNOSIS  To prove hypothyroidism, your caregiver may do blood tests and ultrasound tests. Sometimes the signs are hidden. It may be necessary for your caregiver to watch this illness with blood tests either before or after diagnosis and treatment. TREATMENT  Low levels of thyroid hormone are increased by using synthetic thyroid hormone. This is a safe, effective treatment. It usually takes about four weeks to gain the full effects of the medication. After you have the full effect of the medication, it will generally take another four weeks for problems to leave. Your caregiver may start you on low doses. If you have had heart problems the dose may be gradually increased. It is generally not an emergency to get rapidly to normal. HOME CARE INSTRUCTIONS   Take your medications as your caregiver suggests. Let your caregiver know of any medications you are taking or start taking. Your caregiver will help you with dosage schedules.   As your condition improves, your dosage needs may increase. It will be necessary to have continuing blood tests as suggested by your caregiver.   Report all suspected medication side effects to your caregiver.  SEEK MEDICAL CARE IF: Seek medical care if you  develop:  Sweating.   Tremulousness (tremors).   Anxiety.   Rapid weight loss.   Heat intolerance.   Emotional swings.   Diarrhea.   Weakness.  SEEK IMMEDIATE MEDICAL CARE IF:  You develop chest pain, an irregular heart beat (palpitations), or a rapid heart beat. MAKE SURE YOU:   Understand these instructions.   Will watch your condition.   Will get help right away if you are not doing well or get worse.  Document Released: 06/04/2005 Document Revised: 02/14/2011 Document Reviewed: 01/23/2008 [facility] Patient Information 2012 [place], [place].Hypercholesterolemia High Blood Cholesterol Cholesterol is a white, waxy, fat-like protein needed by your body in small amounts. The liver makes all the cholesterol you need. It is carried from the liver by the blood through the blood vessels. Deposits (plaque) may build up on blood vessel walls. This makes the arteries narrower and stiffer. Plaque increases the risk for heart attack and stroke. You cannot feel your cholesterol level even if it is very high. The only way to know is by a blood test to check your lipid (fats) levels. Once you know your cholesterol  levels, you should keep a record of the test results. Work with your caregiver to to keep your levels in the desired range. WHAT THE RESULTS MEAN:  Total cholesterol is a rough measure of all the cholesterol in your blood.   LDL is the so-called bad cholesterol. This is the type that deposits cholesterol in the walls of the arteries. You want this level to be low.   HDL is the good cholesterol because it cleans the arteries and carries the LDL away. You want this level to be high.   Triglycerides are fat that the body can either burn for energy or store. High levels are closely linked to heart disease.  DESIRED LEVELS:  Total cholesterol below 200.   LDL below 100 for people at risk, below 70 for very high risk.   HDL above 50 is good, above 60 is best.   Triglycerides  below 150.  HOW TO LOWER YOUR CHOLESTEROL:  Diet.   Choose fish or white meat chicken and [place], roasted or baked. Limit fatty cuts of red meat, fried foods, and processed meats, such as sausage and lunch meat.   Eat lots of fresh fruits and vegetables. Choose whole grains, beans, pasta, potatoes and cereals.   Use only small amounts of olive, corn or canola oils. Avoid butter, mayonnaise, shortening or palm kernel oils. Avoid foods with trans-fats.   Use skim/nonfat milk and low-fat/nonfat yogurt and cheeses. Avoid whole milk, cream, ice cream, egg yolks and cheeses. Healthy desserts include angel food cake, gingersnaps, animal crackers, hard candy, popsicles, and low-fat/nonfat frozen yogurt. Avoid pastries, cakes, pies and cookies.   Exercise.   A regular program helps decrease LDL and raises HDL.   Helps with weight control.   Do things that increase your activity level like gardening, walking, or taking the stairs.   Medication.   May be prescribed by your caregiver to help lowering cholesterol and the risk for heart disease.   You may need medicine even if your levels are normal if you have several risk factors.  HOME CARE INSTRUCTIONS   Follow your diet and exercise programs as suggested by your caregiver.   Take medications as directed.   Have blood work done when your caregiver feels it is necessary.  MAKE SURE YOU:   Understand these instructions.   Will watch your condition.   Will get help right away if you are not doing well or get worse.  Document Released: 06/04/2005 Document Revised: 02/14/2011 Document Reviewed: 11/20/2006 [facility] Patient Information 2012 [place], [place].

## 2011-07-25 NOTE — Progress Notes (Signed)
  Subjective:    Patient ID: Yvonne [name], female    DOB: [birth year], 71 y.o.   MRN: [id number]  HPI Reports that she is feeling better since they amputated her toe a couple of weeks ago.  It was removed because it was a hammer toe.  She has an appointment to see an Audiologist as she thinks she needs hearing aids.  She denies CP, SOB and does fairly vigorous exercise.  She thinks she has elevated cholesterol and is interested in getting this checked.  Is interested in starting calcium for prevention of osteoporosis .  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  Reports not seeing a cardiologist for this.  States she has never had an ECHO.  No labs for some time.   Review of Systems  Constitutional: Negative for fever, activity change and fatigue.  Respiratory: Negative for chest tightness, shortness of breath and wheezing.   Cardiovascular: Negative for chest pain.  Genitourinary: Negative for dysuria.  Musculoskeletal: Negative for arthralgias.  Neurological: Negative for headaches.       Objective:   Physical Exam  Vitals reviewed. Constitutional: She is oriented to person, place, and time. She appears well-developed and well-nourished.  HENT:  Head: Normocephalic and atraumatic.  Eyes: No scleral icterus.  Cardiovascular: Normal rate and regular rhythm.  Exam reveals no gallop and no friction rub.   Murmur (systolic ) heard. Pulmonary/Chest: Effort normal.  Abdominal: Soft. Bowel sounds are normal. There is no tenderness. There is no rebound.  Neurological: She is alert and oriented to person, place, and time.  Skin: Skin is warm and dry.  Psychiatric: She has a normal mood and affect. Her behavior is normal. Judgment normal.          Assessment & Plan:   1. HYPOTHYROIDISM, UNSPECIFIED  TSH  2. HYPERTENSION  Lipid panel, Comprehensive metabolic panel, CBC  3. CEREBROVASCULAR ACCIDENT, HX OF    4. Insomnia  zolpidem (AMBIEN) 10 MG tablet  5.  Anxiety  amitriptyline (ELAVIL) 50 MG tablet  6. Systolic ejection murmur  Ambulatory referral to Cardiology

## 2011-08-01 ENCOUNTER — Encounter: Payer: Self-pay | Admitting: Family Medicine

## 2011-08-06 ENCOUNTER — Encounter: Payer: Self-pay | Admitting: Cardiovascular Disease

## 2011-08-06 ENCOUNTER — Ambulatory Visit (INDEPENDENT_AMBULATORY_CARE_PROVIDER_SITE_OTHER): Payer: Medicare HMO | Admitting: Cardiovascular Disease

## 2011-08-06 DIAGNOSIS — Z8673 Personal history of transient ischemic attack (TIA), and cerebral infarction without residual deficits: Secondary | ICD-10-CM

## 2011-08-06 DIAGNOSIS — Z8679 Personal history of other diseases of the circulatory system: Secondary | ICD-10-CM

## 2011-08-06 DIAGNOSIS — R011 Cardiac murmur, unspecified: Secondary | ICD-10-CM

## 2011-08-06 DIAGNOSIS — I10 Essential (primary) hypertension: Secondary | ICD-10-CM

## 2011-08-06 NOTE — Progress Notes (Signed)
    Yvonne [name] Date of Birth  Jun 26, 1940 [facility]     [place] [facility]  [address]    [address]   [address] [place], [place]  [address]    [place], [place]  [address] [phone]  Fax  [phone]  [phone]  Fax [phone]   Problem List: 1. Heart Murmur 2. Hypothyroidism 3. Hypertension 4. Toe amputation for hammer toe   History of Present Illness:  Pt is a 71 y.o. female with a hx of stroke at age 31.  She continues to have problems with her right arm.  She has had a heart murmur for years.  She has not had any recent problems.  She has a hx of HTN.  She admits to eating extra salt on occasion.   Current Outpatient Prescriptions on File Prior to Visit  Medication Sig Dispense Refill  . amitriptyline (ELAVIL) 50 MG tablet Take 2 tablets (100 mg total) by mouth at bedtime.  90 tablet  3  . aspirin 81 MG EC tablet Take 81 mg by mouth daily.        . benazepril-hydrochlorthiazide (LOTENSIN HCT) 20-25 MG per tablet Take 1 tablet by mouth daily.  90 tablet  3  . levothyroxine (SYNTHROID, LEVOTHROID) 25 MCG tablet Take 1 tablet (25 mcg total) by mouth daily.  90 tablet  3  . Multiple Vitamin (MULTIVITAMIN) tablet Take 1 tablet by mouth daily.        Marland Kitchen zolpidem (AMBIEN) 10 MG tablet Take 1 tablet (10 mg total) by mouth at bedtime as needed for sleep.  90 tablet  3    No Known Allergies  Past Medical History  Diagnosis Date  . Allergy   . Hypertension   . Thyroid disease   . Stroke     pt was 34    Past Surgical History  Procedure Date  . Abdominal hysterectomy   . Brain surgery   . Toe amputation 2013    2nd toe on right foot, hammer toe    History  Smoking status  . Never Smoker   Smokeless tobacco  . Never Used    History  Alcohol Use  . Yes    2-3 drinks a month    Family History  Problem Relation Age of Onset  . Thyroid disease Mother   . Heart disease Father     Reviw of Systems:  Reviewed in the HPI.  All other  systems are negative.  Physical Exam: Blood pressure 161/88, pulse 82, height 5\' 2"  (1.575 m), weight 144 lb 12.8 oz (65.681 kg). General: Well developed, well nourished, in no acute distress.  Head: Normocephalic, atraumatic, sclera non-icteric, mucus membranes are moist,   Neck: Supple. The is a soft left carotid bruit. JVD not elevated.  Lungs: Clear bilaterally to auscultation without wheezes, rales, or rhonchi. Breathing is unlabored.  Heart: RRR with S1 S2. There is a 2/6 systolic murmur at the left sternal border  Abdomen: Soft, non-tender, non-distended with normoactive bowel sounds. No hepatomegaly. No rebound/guarding. No obvious abdominal masses.  Msk:  Strength and tone appear normal for age.  Extremities: No clubbing or cyanosis. No edema.  Distal pedal pulses are 2+ and equal bilaterally.  Neuro: Alert and oriented X 3. Moves all extremities spontaneously.  Psych:  Responds to questions appropriately with a normal affect.  ECG: NSR, RBBB, LAFB.   Assessment / Plan:

## 2011-08-06 NOTE — Patient Instructions (Addendum)
Your physician recommends that you schedule a follow-up appointment in: 3 months  Your physician recommends that you return for lab work in: 3 months  Your physician has requested that you have an echocardiogram. Echocardiography is a painless test that uses sound waves to create images of your heart. It provides your doctor with information about the size and shape of your heart and how well your heart's chambers and valves are working. This procedure takes approximately one hour. There are no restrictions for this procedure.  Your physician has requested that you have a carotid duplex. This test is an ultrasound of the carotid arteries in your neck. It looks at blood flow through these arteries that supply the brain with blood. Allow one hour for this exam. There are no restrictions or special instructions.  Please decrease your salt intake  Please increase your exercise

## 2011-08-06 NOTE — Assessment & Plan Note (Signed)
She has a soft left carotid bruit.  Will get a carotid duplex

## 2011-08-06 NOTE — Assessment & Plan Note (Signed)
Her initial BP was elevated but it decreased after a few minutes.  I have asked her to limit her salt intake.  Will see her again in 3 months.

## 2011-08-06 NOTE — Assessment & Plan Note (Signed)
Will get an echo for further eval.

## 2011-08-08 NOTE — Progress Notes (Signed)
Addended by: Judithe Modest D on: 08/08/2011 03:06 PM   Modules accepted: Orders

## 2011-08-15 ENCOUNTER — Other Ambulatory Visit: Payer: Self-pay

## 2011-08-15 ENCOUNTER — Ambulatory Visit (HOSPITAL_COMMUNITY): Payer: Medicare HMO | Attending: Cardiology

## 2011-08-15 DIAGNOSIS — I079 Rheumatic tricuspid valve disease, unspecified: Secondary | ICD-10-CM | POA: Insufficient documentation

## 2011-08-15 DIAGNOSIS — I10 Essential (primary) hypertension: Secondary | ICD-10-CM | POA: Insufficient documentation

## 2011-08-15 DIAGNOSIS — I080 Rheumatic disorders of both mitral and aortic valves: Secondary | ICD-10-CM | POA: Insufficient documentation

## 2011-08-15 DIAGNOSIS — R011 Cardiac murmur, unspecified: Secondary | ICD-10-CM

## 2011-08-16 NOTE — Progress Notes (Signed)
LMTCB

## 2011-08-23 ENCOUNTER — Encounter (INDEPENDENT_AMBULATORY_CARE_PROVIDER_SITE_OTHER): Payer: Medicare HMO

## 2011-08-23 ENCOUNTER — Other Ambulatory Visit: Payer: Self-pay | Admitting: *Deleted

## 2011-08-23 DIAGNOSIS — I6529 Occlusion and stenosis of unspecified carotid artery: Secondary | ICD-10-CM

## 2011-08-23 DIAGNOSIS — R0989 Other specified symptoms and signs involving the circulatory and respiratory systems: Secondary | ICD-10-CM

## 2011-08-23 DIAGNOSIS — Z8673 Personal history of transient ischemic attack (TIA), and cerebral infarction without residual deficits: Secondary | ICD-10-CM

## 2011-08-27 ENCOUNTER — Telehealth: Payer: Self-pay | Admitting: Cardiovascular Disease

## 2011-08-27 NOTE — Telephone Encounter (Signed)
Fu call °Patient returning your call °

## 2011-08-27 NOTE — Telephone Encounter (Signed)
Called pt with results.

## 2011-09-05 ENCOUNTER — Encounter: Payer: Self-pay | Admitting: Family Medicine

## 2011-09-05 ENCOUNTER — Ambulatory Visit (INDEPENDENT_AMBULATORY_CARE_PROVIDER_SITE_OTHER): Payer: Medicare HMO | Admitting: Family Medicine

## 2011-09-05 VITALS — BP 160/96 | HR 72 | Temp 98.4°F | Ht 62.0 in | Wt 144.0 lb

## 2011-09-05 DIAGNOSIS — F419 Anxiety disorder, unspecified: Secondary | ICD-10-CM

## 2011-09-05 DIAGNOSIS — Z8679 Personal history of other diseases of the circulatory system: Secondary | ICD-10-CM

## 2011-09-05 DIAGNOSIS — G4700 Insomnia, unspecified: Secondary | ICD-10-CM

## 2011-09-05 DIAGNOSIS — I062 Rheumatic aortic stenosis with insufficiency: Secondary | ICD-10-CM

## 2011-09-05 DIAGNOSIS — F411 Generalized anxiety disorder: Secondary | ICD-10-CM

## 2011-09-05 DIAGNOSIS — I10 Essential (primary) hypertension: Secondary | ICD-10-CM

## 2011-09-05 DIAGNOSIS — E039 Hypothyroidism, unspecified: Secondary | ICD-10-CM

## 2011-09-05 DIAGNOSIS — E7800 Pure hypercholesterolemia, unspecified: Secondary | ICD-10-CM

## 2011-09-05 MED ORDER — ZOLPIDEM TARTRATE 5 MG PO TABS: 5.0000 mg | ORAL_TABLET | Freq: Every evening | ORAL | Status: DC | PRN

## 2011-09-05 MED ORDER — BENAZEPRIL-HYDROCHLOROTHIAZIDE 20-25 MG PO TABS
1.0000 | ORAL_TABLET | Freq: Every day | ORAL | Status: DC
Start: 2011-09-05 — End: 2012-06-04

## 2011-09-05 MED ORDER — ATORVASTATIN CALCIUM 10 MG PO TABS: 10.0000 mg | ORAL_TABLET | Freq: Every day | ORAL | Status: DC

## 2011-09-05 MED ORDER — LEVOTHYROXINE SODIUM 50 MCG PO TABS: 25.0000 ug | ORAL_TABLET | Freq: Every day | ORAL | Status: DC

## 2011-09-05 MED ORDER — AMITRIPTYLINE HCL 50 MG PO TABS: 100.0000 mg | ORAL_TABLET | Freq: Every day | ORAL | Status: DC

## 2011-09-05 NOTE — Assessment & Plan Note (Signed)
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

## 2011-09-05 NOTE — Patient Instructions (Signed)
Stroke (Cerebrovascular Accident) A stroke (cerebrovascular accident, CVA) means you have a brain injury from blocked circulation or bleeding in the brain. Blocked circulation usually comes from a clot. RISK FACTORS  High blood pressure (hypertension).   High cholesterol.   Diabetes.   Heart disease.   The buildup of fatty deposits in the blood vessels (peripheral artery disease or atherosclerosis).   An abnormal heart rhythm (atrial fibrillation).   Obesity.   Smoking.   Taking oral contraceptives (especially in combination with smoking).   Physical inactivity.   A diet high in fats, salt (sodium), and calories.   Alcohol use.   Use of illegal drugs (especially cocaine and methamphetamine).   Being a female.   Being an Tree surgeon.   Age over 50.   Family history of stroke.   Previous history of blood clots, a "warning stroke" (transient ischemic attack, TIA), or heart attack.   Sickle cell disease.  SYMPTOMS  The symptoms of a stroke depend on the part of the brain that is affected. It is important to seek treatment within 4 hours of the start of symptoms because you may receive a "clot dissolving" medication that cannot be given after that time. Even if you don't know when your symptoms began, get treatment as soon as possible. Symptoms of a stroke may progress or change over the first several days. Symptoms may include:  Sudden weakness or numbness of the face, arm, or leg, especially on one side of the body.   Sudden confusion.   Trouble speaking (aphasia) or understanding.   Sudden trouble seeing in one or both eyes.   Sudden trouble walking.   Dizziness.   Loss of balance or coordination.   Sudden severe headache with no known cause.  HOME CARE INSTRUCTIONS   Medicines: Aspirin and blood thinners may be used to prevent another stroke. Blood thinners need to be used exactly as instructed. Medicines may also be used to control risk factors for a  stroke. Be sure you understand all your medicine instructions.   Diet: Certain diets may be prescribed to address high blood pressure, high cholesterol, diabetes, or obesity. A diet that includes 5 or more servings of fruits and vegetables a day may reduce the risk of stroke. Foods may need to be a special consistency (soft or pureed), or small bites may need to be taken in order to avoid aspirating or choking.   Maintain a healthy weight.   Stay physically active. It is recommended that you get at least 30 minutes of activity on most or all days.   Do not smoke.   Limit alcohol use.   Stop drug abuse.   Home safety: A safe home environment is important to reduce the risk of falls. Your caregiver may arrange for specialists to evaluate your home. Having grab bars in the bedroom and bathroom is often important. Your caregiver may arrange for special equipment to be used at home, such as raised toilets and a seat for the shower.   Physical, occupational, and speech therapy: Ongoing therapy may be needed to maximize your recovery after a stroke. If you have been advised to use a walker or a cane, use it at all times. Be sure to keep your therapy appointments.   Follow all instructions for follow-up with your caregiver. This is VERY important. This includes any referrals, physical therapy, rehabilitation, and laboratory tests. Proper treatment also prevents another stroke from occurring.  SEEK IMMEDIATE MEDICAL CARE IF:   You have  sudden weakness or numbness of the face, arm, or leg, especially on one side of the body.   You have sudden confusion.   You have trouble speaking (aphasia) or understanding.   You have sudden trouble seeing in one or both eyes.   You have sudden trouble walking.   You have dizziness.   You have loss of balance or coordination.   You have a sudden, severe headache with no known cause.   You have a fever.   You are coughing or have difficulty breathing.     You have new chest pain, angina, or an irregular heartbeat.  Any of these symptoms may represent a serious problem that is an emergency. Do not wait to see if the symptoms will go away. Get medical help at once. Call your local emergency services (911 in [place] Do not drive yourself to the hospital. Document Released: 06/04/2005 Document Revised: 12/18/2010 Document Reviewed: 11/02/2009 [facility] Patient Information 2012 [place], [place].Hypothyroidism The thyroid is a large gland located in the lower front of your neck. The thyroid gland helps control metabolism. Metabolism is how your body handles food. It controls metabolism with the hormone thyroxine. When this gland is underactive (hypothyroid), it produces too little hormone.  CAUSES These include:   Absence or destruction of thyroid tissue.   Goiter due to iodine deficiency.   Goiter due to medications.   Congenital defects (since birth).   Problems with the pituitary. This causes a lack of TSH (thyroid stimulating hormone). This hormone tells the thyroid to turn out more hormone.  SYMPTOMS  Lethargy (feeling as though you have no energy)   Cold intolerance   Weight gain (in spite of normal food intake)   Dry skin   Coarse hair   Menstrual irregularity (if severe, may lead to infertility)   Slowing of thought processes  Cardiac problems are also caused by insufficient amounts of thyroid hormone. Hypothyroidism in the newborn is cretinism, and is an extreme form. It is important that this form be treated adequately and immediately or it will lead rapidly to retarded physical and mental development. DIAGNOSIS  To prove hypothyroidism, your caregiver may do blood tests and ultrasound tests. Sometimes the signs are hidden. It may be necessary for your caregiver to watch this illness with blood tests either before or after diagnosis and treatment. TREATMENT  Low levels of thyroid hormone are increased by using synthetic  thyroid hormone. This is a safe, effective treatment. It usually takes about four weeks to gain the full effects of the medication. After you have the full effect of the medication, it will generally take another four weeks for problems to leave. Your caregiver may start you on low doses. If you have had heart problems the dose may be gradually increased. It is generally not an emergency to get rapidly to normal. HOME CARE INSTRUCTIONS   Take your medications as your caregiver suggests. Let your caregiver know of any medications you are taking or start taking. Your caregiver will help you with dosage schedules.   As your condition improves, your dosage needs may increase. It will be necessary to have continuing blood tests as suggested by your caregiver.   Report all suspected medication side effects to your caregiver.  SEEK MEDICAL CARE IF: Seek medical care if you develop:  Sweating.   Tremulousness (tremors).   Anxiety.   Rapid weight loss.   Heat intolerance.   Emotional swings.   Diarrhea.   Weakness.  SEEK IMMEDIATE MEDICAL CARE  IF:  You develop chest pain, an irregular heart beat (palpitations), or a rapid heart beat. MAKE SURE YOU:   Understand these instructions.   Will watch your condition.   Will get help right away if you are not doing well or get worse.  Document Released: 06/04/2005 Document Revised: 05/24/2011 Document Reviewed: 01/23/2008 [facility] Patient Information 2012 [place], [place].Hypercholesterolemia High Blood Cholesterol Cholesterol is a white, waxy, fat-like protein needed by your body in small amounts. The liver makes all the cholesterol you need. It is carried from the liver by the blood through the blood vessels. Deposits (plaque) may build up on blood vessel walls. This makes the arteries narrower and stiffer. Plaque increases the risk for heart attack and stroke. You cannot feel your cholesterol level even if it is very high. The only way to know  is by a blood test to check your lipid (fats) levels. Once you know your cholesterol levels, you should keep a record of the test results. Work with your caregiver to to keep your levels in the desired range. WHAT THE RESULTS MEAN:  Total cholesterol is a rough measure of all the cholesterol in your blood.   LDL is the so-called bad cholesterol. This is the type that deposits cholesterol in the walls of the arteries. You want this level to be low.   HDL is the good cholesterol because it cleans the arteries and carries the LDL away. You want this level to be high.   Triglycerides are fat that the body can either burn for energy or store. High levels are closely linked to heart disease.  DESIRED LEVELS:  Total cholesterol below 200.   LDL below 100 for people at risk, below 70 for very high risk.   HDL above 50 is good, above 60 is best.   Triglycerides below 150.  HOW TO LOWER YOUR CHOLESTEROL:  Diet.   Choose fish or white meat chicken and [place], roasted or baked. Limit fatty cuts of red meat, fried foods, and processed meats, such as sausage and lunch meat.   Eat lots of fresh fruits and vegetables. Choose whole grains, beans, pasta, potatoes and cereals.   Use only small amounts of olive, corn or canola oils. Avoid butter, mayonnaise, shortening or palm kernel oils. Avoid foods with trans-fats.   Use skim/nonfat milk and low-fat/nonfat yogurt and cheeses. Avoid whole milk, cream, ice cream, egg yolks and cheeses. Healthy desserts include angel food cake, gingersnaps, animal crackers, hard candy, popsicles, and low-fat/nonfat frozen yogurt. Avoid pastries, cakes, pies and cookies.   Exercise.   A regular program helps decrease LDL and raises HDL.   Helps with weight control.   Do things that increase your activity level like gardening, walking, or taking the stairs.   Medication.   May be prescribed by your caregiver to help lowering cholesterol and the risk for heart  disease.   You may need medicine even if your levels are normal if you have several risk factors.  HOME CARE INSTRUCTIONS   Follow your diet and exercise programs as suggested by your caregiver.   Take medications as directed.   Have blood work done when your caregiver feels it is necessary.  MAKE SURE YOU:   Understand these instructions.   Will watch your condition.   Will get help right away if you are not doing well or get worse.  Document Released: 06/04/2005 Document Revised: 05/24/2011 Document Reviewed: 11/20/2006 [facility] Patient Information 2012 [place], [place].

## 2011-09-05 NOTE — Assessment & Plan Note (Signed)
Up x two years in a row despite diet, will start low dose statin

## 2011-09-05 NOTE — Progress Notes (Signed)
  Subjective:    Patient ID: Yvonne [name], female    DOB: 1941/04/05, 71 y.o.   MRN: [id number]  HPI Here today for f/u.  Last seen here and blood work revealed elevated cholesterol and under-treated thyroid.  She has seen Cards since last here and had murmur evaluated.  She was found to have mild aortic stenosis and insufficiency.  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  Needs meds refill.   Review of Systems  Constitutional: Negative for fever and fatigue.  HENT: Negative for congestion and rhinorrhea.   Respiratory: Negative for cough, shortness of breath and wheezing.   Cardiovascular: Negative for chest pain and palpitations.  Gastrointestinal: Negative for nausea, vomiting and abdominal pain.  Genitourinary: Negative for dysuria and difficulty urinating.  Musculoskeletal: Positive for back pain and arthralgias.  Skin: Negative for rash.  Neurological: Negative for dizziness, weakness and headaches.       Objective:   Physical Exam  Vitals reviewed. Constitutional: She is oriented to person, place, and time. She appears well-developed and well-nourished.  HENT:  Head: Normocephalic and atraumatic.  Eyes: No scleral icterus.  Neck: Neck supple.  Cardiovascular: Normal rate and regular rhythm.   Murmur heard. Pulmonary/Chest: Effort normal.  Abdominal: Soft. She exhibits no distension. There is no tenderness.  Neurological: She is alert and oriented to person, place, and time.          Assessment & Plan:   1. Hypothyroid  levothyroxine (SYNTHROID, LEVOTHROID) 50 MCG tablet  2. Hypertension  benazepril-hydrochlorthiazide (LOTENSIN HCT) 20-25 MG per tablet  3. Anxiety  amitriptyline (ELAVIL) 50 MG tablet  4. Insomnia  zolpidem (AMBIEN) 5 MG tablet  5. CEREBROVASCULAR ACCIDENT, HX OF    6. Aortic valve stenosis and insufficiency, rheumatic    7. HYPOTHYROIDISM,  UNSPECIFIED    8. Hypercholesteremia  atorvastatin (LIPITOR) 10 MG tablet  9. INSOMNIA, PERSISTENT    10. ANXIETY

## 2011-09-05 NOTE — Assessment & Plan Note (Signed)
Increased Synthroid to 50 mcg daily, recheck in 6 months.

## 2011-10-31 ENCOUNTER — Encounter: Payer: Self-pay | Admitting: Family Medicine

## 2011-10-31 ENCOUNTER — Ambulatory Visit (INDEPENDENT_AMBULATORY_CARE_PROVIDER_SITE_OTHER): Payer: Medicare HMO | Admitting: Family Medicine

## 2011-10-31 VITALS — BP 151/83 | HR 76 | Temp 98.4°F | Ht 62.0 in | Wt 139.0 lb

## 2011-10-31 DIAGNOSIS — E7800 Pure hypercholesterolemia, unspecified: Secondary | ICD-10-CM

## 2011-10-31 DIAGNOSIS — I062 Rheumatic aortic stenosis with insufficiency: Secondary | ICD-10-CM

## 2011-10-31 DIAGNOSIS — E039 Hypothyroidism, unspecified: Secondary | ICD-10-CM

## 2011-10-31 DIAGNOSIS — Z5181 Encounter for therapeutic drug level monitoring: Secondary | ICD-10-CM

## 2011-10-31 DIAGNOSIS — I10 Essential (primary) hypertension: Secondary | ICD-10-CM

## 2011-10-31 LAB — COMPREHENSIVE METABOLIC PANEL
ALT: 9 U/L (ref 0–35)
AST: 14 U/L (ref 0–37)
Alkaline Phosphatase: 80 U/L (ref 39–117)
BUN: 9 mg/dL (ref 6–23)
Calcium: 9.3 mg/dL (ref 8.4–10.5)
Chloride: 100 mEq/L (ref 96–112)
Creat: 0.77 mg/dL (ref 0.50–1.10)
Total Bilirubin: 0.4 mg/dL (ref 0.3–1.2)

## 2011-10-31 MED ORDER — AMLODIPINE BESYLATE 5 MG PO TABS: 5.0000 mg | ORAL_TABLET | Freq: Every day | ORAL | Status: DC

## 2011-10-31 NOTE — Assessment & Plan Note (Addendum)
Will add CCB, since not at goal and maxed on other therapy

## 2011-10-31 NOTE — Patient Instructions (Signed)
Hypertension As your heart beats, it forces blood through your arteries. This force is your blood pressure. If the pressure is too high, it is called hypertension (HTN) or high blood pressure. HTN is dangerous because you may have it and not know it. High blood pressure may mean that your heart has to work harder to pump blood. Your arteries may be narrow or stiff. The extra work puts you at risk for heart disease, stroke, and other problems.  Blood pressure consists of two numbers, a higher number over a lower, 110/72, for example. It is stated as "110 over 72." The ideal is below 120 for the top number (systolic) and under 80 for the bottom (diastolic). Write down your blood pressure today. You should pay close attention to your blood pressure if you have certain conditions such as:  Heart failure.   Prior heart attack.   Diabetes   Chronic kidney disease.   Prior stroke.   Multiple risk factors for heart disease.  To see if you have HTN, your blood pressure should be measured while you are seated with your arm held at the level of the heart. It should be measured at least twice. A one-time elevated blood pressure reading (especially in the Emergency Department) does not mean that you need treatment. There may be conditions in which the blood pressure is different between your right and left arms. It is important to see your caregiver soon for a recheck. Most people have essential hypertension which means that there is not a specific cause. This type of high blood pressure may be lowered by changing lifestyle factors such as:  Stress.   Smoking.   Lack of exercise.   Excessive weight.   Drug/tobacco/alcohol use.   Eating less salt.  Most people do not have symptoms from high blood pressure until it has caused damage to the body. Effective treatment can often prevent, delay or reduce that damage. TREATMENT  When a cause has been identified, treatment for high blood pressure is  directed at the cause. There are a large number of medications to treat HTN. These fall into several categories, and your caregiver will help you select the medicines that are best for you. Medications may have side effects. You should review side effects with your caregiver. If your blood pressure stays high after you have made lifestyle changes or started on medicines,   Your medication(s) may need to be changed.   Other problems may need to be addressed.   Be certain you understand your prescriptions, and know how and when to take your medicine.   Be sure to follow up with your caregiver within the time frame advised (usually within two weeks) to have your blood pressure rechecked and to review your medications.   If you are taking more than one medicine to lower your blood pressure, make sure you know how and at what times they should be taken. Taking two medicines at the same time can result in blood pressure that is too low.  SEEK IMMEDIATE MEDICAL CARE IF:  You develop a severe headache, blurred or changing vision, or confusion.   You have unusual weakness or numbness, or a faint feeling.   You have severe chest or abdominal pain, vomiting, or breathing problems.  MAKE SURE YOU:   Understand these instructions.   Will watch your condition.   Will get help right away if you are not doing well or get worse.  Document Released: 06/04/2005 Document Revised: 05/24/2011 Document Reviewed:   01/23/2008 [employer] Patient Information 2012 [place], [place].Hypercholesterolemia High Blood Cholesterol Cholesterol is a white, waxy, fat-like protein needed by your body in small amounts. The liver makes all the cholesterol you need. It is carried from the liver by the blood through the blood vessels. Deposits (plaque) may build up on blood vessel walls. This makes the arteries narrower and stiffer. Plaque increases the risk for heart attack and stroke. You cannot feel your cholesterol level even if  it is very high. The only way to know is by a blood test to check your lipid (fats) levels. Once you know your cholesterol levels, you should keep a record of the test results. Work with your caregiver to to keep your levels in the desired range. WHAT THE RESULTS MEAN:  Total cholesterol is a rough measure of all the cholesterol in your blood.   LDL is the so-called bad cholesterol. This is the type that deposits cholesterol in the walls of the arteries. You want this level to be low.   HDL is the good cholesterol because it cleans the arteries and carries the LDL away. You want this level to be high.   Triglycerides are fat that the body can either burn for energy or store. High levels are closely linked to heart disease.  DESIRED LEVELS:  Total cholesterol below 200.   LDL below 100 for people at risk, below 70 for very high risk.   HDL above 50 is good, above 60 is best.   Triglycerides below 150.  HOW TO LOWER YOUR CHOLESTEROL:  Diet.   Choose fish or white meat chicken and [place], roasted or baked. Limit fatty cuts of red meat, fried foods, and processed meats, such as sausage and lunch meat.   Eat lots of fresh fruits and vegetables. Choose whole grains, beans, pasta, potatoes and cereals.   Use only small amounts of olive, corn or canola oils. Avoid butter, mayonnaise, shortening or palm kernel oils. Avoid foods with trans-fats.   Use skim/nonfat milk and low-fat/nonfat yogurt and cheeses. Avoid whole milk, cream, ice cream, egg yolks and cheeses. Healthy desserts include angel food cake, gingersnaps, animal crackers, hard candy, popsicles, and low-fat/nonfat frozen yogurt. Avoid pastries, cakes, pies and cookies.   Exercise.   A regular program helps decrease LDL and raises HDL.   Helps with weight control.   Do things that increase your activity level like gardening, walking, or taking the stairs.   Medication.   May be prescribed by your caregiver to help lowering  cholesterol and the risk for heart disease.   You may need medicine even if your levels are normal if you have several risk factors.  HOME CARE INSTRUCTIONS   Follow your diet and exercise programs as suggested by your caregiver.   Take medications as directed.   Have blood work done when your caregiver feels it is necessary.  MAKE SURE YOU:   Understand these instructions.   Will watch your condition.   Will get help right away if you are not doing well or get worse.  Document Released: 06/04/2005 Document Revised: 05/24/2011 Document Reviewed: 11/20/2006 [facility] Patient Information 2012 [place], [place].

## 2011-11-02 ENCOUNTER — Telehealth: Payer: Self-pay | Admitting: Family Medicine

## 2011-11-02 DIAGNOSIS — E039 Hypothyroidism, unspecified: Secondary | ICD-10-CM

## 2011-11-02 MED ORDER — LEVOTHYROXINE SODIUM 75 MCG PO TABS: 75.0000 ug | ORAL_TABLET | Freq: Every day | ORAL | Status: DC

## 2011-11-02 NOTE — Assessment & Plan Note (Signed)
Will check TSH and adjust medication as necessary.

## 2011-11-02 NOTE — Progress Notes (Signed)
  Subjective:    Patient ID: Yvonne [name], female    DOB: November 21, 1940, 72 y.o.   MRN: [id number]  HPI Here today for f/u.  Started on statin therapy for elevated lipids.  Needs eval of LFT's.  Also, increased her thyroid replacement at last visit and needs this rechecked. Notes no sx's.  BP is high today--on recheck it is 142/84.   Review of Systems  Constitutional: Negative for fever and fatigue.  HENT: Negative for congestion, rhinorrhea and sinus pressure.   Cardiovascular: Negative for chest pain and palpitations.  Gastrointestinal: Negative for nausea, vomiting and abdominal pain.  Genitourinary: Negative for dyspareunia.       Objective:   Physical Exam  Vitals reviewed. Constitutional: She is oriented to person, place, and time. She appears well-developed and well-nourished.  HENT:  Head: Normocephalic and atraumatic.  Eyes: No scleral icterus.  Neck: Neck supple.  Cardiovascular: Normal rate and regular rhythm.   Murmur (II/VI SEM) heard. Pulmonary/Chest: Effort normal and breath sounds normal. No respiratory distress.  Abdominal: Soft. There is no tenderness.  Neurological: She is alert and oriented to person, place, and time.  Skin: Skin is warm and dry.          Assessment & Plan:

## 2011-11-02 NOTE — Telephone Encounter (Signed)
rx called in . Haseeb Fiallos Dawn  

## 2011-11-02 NOTE — Assessment & Plan Note (Signed)
Started lipid lowering therapy-will check LFT's, repeat LDL at 6 mos.

## 2011-11-02 NOTE — Telephone Encounter (Signed)
TSh is too high--will increase dosing further. Discussed with pt.

## 2011-11-06 ENCOUNTER — Encounter: Payer: Self-pay | Admitting: Cardiovascular Disease

## 2011-11-06 ENCOUNTER — Ambulatory Visit (INDEPENDENT_AMBULATORY_CARE_PROVIDER_SITE_OTHER): Payer: Medicare HMO | Admitting: Cardiovascular Disease

## 2011-11-06 VITALS — BP 148/89 | HR 81 | Ht 62.0 in | Wt 140.8 lb

## 2011-11-06 DIAGNOSIS — I10 Essential (primary) hypertension: Secondary | ICD-10-CM

## 2011-11-06 NOTE — Progress Notes (Signed)
Yvonne [name] Date of Birth  [birth year] [facility]     [place] [facility]  [address]    [address]   [address] [place], [place]  [address]    [place], [place]  [address] [phone]  Fax  [phone]  [phone]  Fax [phone]   Problem List: 1. Heart Murmur 2. Hypothyroidism 3. Hypertension 4. Toe amputation for hammer toe 5. History of stroke  History of Present Illness:  Pt is a 71 y.o. female with a hx of stroke at age 73.  She continues to have problems with her right arm.  She has had a heart murmur for years.  She has not had any recent problems.  She has a hx of HTN.  She admits to eating extra salt on occasion.  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 This corresponds with her previous stroke. There was no significant disease on right carotid. An echocardiogram revealed normal left ventricular systolic function.  She has mild aortic stenosis and aortic insufficiency which explains her heart murmur.  She eats lots of salty meals.  She eats prepared lunch and dinners every day.  She eats popcorn every night.     Current Outpatient Prescriptions on File Prior to Visit  Medication Sig Dispense Refill  . amitriptyline (ELAVIL) 50 MG tablet Take 2 tablets (100 mg total) by mouth at bedtime.  180 tablet  3  . amLODipine (NORVASC) 5 MG tablet Take 1 tablet (5 mg total) by mouth daily.  90 tablet  3  . aspirin 81 MG EC tablet Take 81 mg by mouth daily.        Marland Kitchen atorvastatin (LIPITOR) 10 MG tablet Take 1 tablet (10 mg total) by mouth daily.  90 tablet  3  . benazepril-hydrochlorthiazide (LOTENSIN HCT) 20-25 MG per tablet Take 1 tablet by mouth daily.  90 tablet  3  . levothyroxine (SYNTHROID, LEVOTHROID) 75 MCG tablet Take 1 tablet (75 mcg total) by mouth daily.  90 tablet  3  . Multiple Vitamin (MULTIVITAMIN) tablet Take 1 tablet by mouth daily.        Marland Kitchen zolpidem (AMBIEN) 5 MG tablet Take 1  tablet (5 mg total) by mouth at bedtime as needed for sleep.  90 tablet  3  . zolpidem (AMBIEN) 10 MG tablet Take 1 tablet (10 mg total) by mouth at bedtime as needed for sleep.  90 tablet  3    No Known Allergies  Past Medical History  Diagnosis Date  . Allergy   . Hypertension   . Thyroid disease   . Stroke     pt was 27    Past Surgical History  Procedure Date  . Abdominal hysterectomy   . Brain surgery   . Toe amputation 2013    2nd toe on right foot, hammer toe    History  Smoking status  . Never Smoker   Smokeless tobacco  . Never Used    History  Alcohol Use  . Yes    2-3 drinks a month    Family History  Problem Relation Age of Onset  . Thyroid disease Mother   . Heart disease Father     Reviw of Systems:  Reviewed in the HPI.  All other systems are negative.  Physical Exam: Blood pressure 148/89, pulse 81, height 5\' 2"  (1.575 m), weight 140 lb 12.8 oz (63.866 kg). General: Well developed, well nourished, in no  acute distress.  Head: Normocephalic, atraumatic, sclera non-icteric, mucus membranes are moist,   Neck: Supple. The is a soft left carotid bruit. JVD not elevated.  Lungs: Clear bilaterally to auscultation without wheezes, rales, or rhonchi. Breathing is unlabored.  Heart: RRR with S1 S2. There is a 2/6 systolic murmur at the left sternal border  Abdomen: Soft, non-tender, non-distended with normoactive bowel sounds. No hepatomegaly. No rebound/guarding. No obvious abdominal masses.  Msk:  Strength and tone appear normal for age.  Extremities: No clubbing or cyanosis. No edema.  Distal pedal pulses are 2+ and equal bilaterally.  Neuro: Alert and oriented X 3. Moves all extremities spontaneously.  Psych:  Responds to questions appropriately with a normal affect.  ECG:  Assessment / Plan:

## 2011-11-06 NOTE — Patient Instructions (Signed)
Your physician recommends that you schedule a follow-up appointment in: AS NEEDED BASIS  

## 2011-11-06 NOTE — Assessment & Plan Note (Signed)
Yvonne [name] presents today for followup visit. Her echocardiogram reveals normal left ventricular systolic function. She has mild aortic stenosis and mild aortic insufficiency. Her carotid artery disease history stable. She has an occluded left carotid artery and no significant disease in the right coronary artery.  She eats a very high salt diet. She eats prepared dinners feet for every meal and then eats popcorn every night. I tried talking to her about changing her diet. She seems to be convinced that she does not have any other options. She does not cook.   We will have her followup with Dr. Shawnie Pons.  I'll be happy to see her on an as-needed basis.

## 2011-11-09 ENCOUNTER — Telehealth: Payer: Self-pay | Admitting: *Deleted

## 2011-11-09 NOTE — Telephone Encounter (Signed)
Received fax from [facility] for [employer] for PA.  Apparently was generated by pharmacy.  Placed in MD box for completion.

## 2011-11-13 NOTE — Telephone Encounter (Signed)
Form faxed to insurance 

## 2011-11-14 NOTE — Telephone Encounter (Signed)
Received denial from [facility] for Zolpidem.  It stated it was denied because " the drug requested has maximum dispensing limits in place . The medication is indicated for short term use and is available with an annual limit of 90 capsules per 365 days . Quantities exceeding these limits may be obtained at the members expense.  "   States patient has right to appeal within 60 days after the date of this notice. Will forward to Dr. Shawnie Pons.

## 2011-11-15 ENCOUNTER — Ambulatory Visit: Payer: Medicare HMO | Admitting: Cardiovascular Disease

## 2011-11-15 ENCOUNTER — Other Ambulatory Visit: Payer: Medicare HMO

## 2011-11-20 NOTE — Telephone Encounter (Signed)
Message left on voicemail to return call.

## 2011-11-20 NOTE — Telephone Encounter (Signed)
Rx denied--doubt appeal will help--could try, they have never denied it previously--ask pt. How often she is using.

## 2011-11-20 NOTE — Telephone Encounter (Signed)
Thanks

## 2011-11-20 NOTE — Telephone Encounter (Signed)
Spoke with patient and she states she will try without the medication to see how she does.

## 2011-12-27 ENCOUNTER — Encounter: Payer: Self-pay | Admitting: Family Medicine

## 2012-04-04 ENCOUNTER — Encounter: Payer: Self-pay | Admitting: Family Medicine

## 2012-04-28 ENCOUNTER — Encounter: Payer: Self-pay | Admitting: Home Health Services

## 2012-05-01 ENCOUNTER — Ambulatory Visit: Payer: Medicare HMO | Admitting: Home Health Services

## 2012-05-06 ENCOUNTER — Ambulatory Visit (INDEPENDENT_AMBULATORY_CARE_PROVIDER_SITE_OTHER): Payer: Medicare HMO | Admitting: Home Health Services

## 2012-05-06 ENCOUNTER — Encounter: Payer: Self-pay | Admitting: Home Health Services

## 2012-05-06 VITALS — BP 158/74 | HR 63 | Temp 97.5°F | Ht 62.0 in | Wt 145.6 lb

## 2012-05-06 DIAGNOSIS — Z0000 Encounter for general adult medical examination without abnormal findings: Secondary | ICD-10-CM

## 2012-05-06 NOTE — Progress Notes (Signed)
Patient here for annual wellness visit, patient reports: Risk Factors/Conditions needing evaluation or treatment: Pt does not have any new risk factors that need evaluation. Home Safety: Pt lives by self in 1 story home.  Pt reports having smoke detectors and adaptive equipment in bathroom. Other Information: Corrective lens: Pt reports having lasik surgery 5 yrs ago. Dentures: Pt has full upper dentures. None on bottome. Memory: Pt reports some memory problems. Patient's Mini Mental Score (recorded in doc. flowsheet): 30  Fall risk: Pt reports no falls in the past year    Annual Wellness Visit Requirements Recorded Today In  Medical, family, social history Past Medical, Family, Social History Section  Current providers Care team  Current medications Medications  Wt, BP, Ht, BMI Vital signs  Tobacco, alcohol, illicit drug use History  ADL Nurse Assessment  Depression Screening Nurse Assessment  Cognitive impairment Nurse Assessment  Mini Mental Status Document Flowsheet  Fall Risk Nurse Assessment  Home Safety Progress Note  End of Life Planning (welcome visit) Social Documentation  Medicare preventative services Progress Note  Risk factors/conditions needing evaluation/treatment Progress Note  Personalized health advice Patient Instructions, goals, letter  Diet & Exercise Social Documentation  Emergency Contact Social Documentation  Seat Belts Social Documentation  Sun exposure/protection Social Documentation    Prevention Plan:   Recommended Medicare Prevention Screenings Women over 65 Test For Frequency Date of Last- BOLD if needed  Breast Cancer 1-2 yrs 12/12  Cervical Cancer 1-3 yrs hysterectomy  Colorectal Cancer 1-10 yrs 4/11  Osteoporosis once discussed  Cholesterol 5 yrs 2/13  Diabetes yearly 5/13  HIV yearly delcined  Influenza Shot yearly Pt reported done at [facility] 9/13  Pneumonia Shot once 11/12  Zostavax Shot once declined

## 2012-05-06 NOTE — Patient Instructions (Signed)
1. Continue to take all your medications daily. 2. Continue to walk on treadmill 3-4 times a week. 3. Keep up the great work with your weight maintenance. 4. Focus on eating 3-5 fruits and vegetables a day.  5. Consider getting shingles vaccine at your pharmacy.

## 2012-05-06 NOTE — Progress Notes (Signed)
Patient ID: [name], female   DOB: 1941-04-28, 71 y.o.   MRN: [id number]   I have reviewed this visit and discussed with [name] and agree with her documentation.

## 2012-06-04 ENCOUNTER — Encounter: Payer: Self-pay | Admitting: Family Medicine

## 2012-06-04 ENCOUNTER — Ambulatory Visit (INDEPENDENT_AMBULATORY_CARE_PROVIDER_SITE_OTHER): Payer: Medicare HMO | Admitting: Family Medicine

## 2012-06-04 VITALS — BP 174/76 | HR 60 | Temp 97.7°F | Ht 62.0 in | Wt 145.0 lb

## 2012-06-04 DIAGNOSIS — E7800 Pure hypercholesterolemia, unspecified: Secondary | ICD-10-CM

## 2012-06-04 DIAGNOSIS — K5900 Constipation, unspecified: Secondary | ICD-10-CM

## 2012-06-04 DIAGNOSIS — G4700 Insomnia, unspecified: Secondary | ICD-10-CM

## 2012-06-04 DIAGNOSIS — I10 Essential (primary) hypertension: Secondary | ICD-10-CM

## 2012-06-04 DIAGNOSIS — Z23 Encounter for immunization: Secondary | ICD-10-CM

## 2012-06-04 DIAGNOSIS — Z13820 Encounter for screening for osteoporosis: Secondary | ICD-10-CM

## 2012-06-04 DIAGNOSIS — E039 Hypothyroidism, unspecified: Secondary | ICD-10-CM

## 2012-06-04 DIAGNOSIS — I062 Rheumatic aortic stenosis with insufficiency: Secondary | ICD-10-CM

## 2012-06-04 MED ORDER — LEVOTHYROXINE SODIUM 75 MCG PO TABS: 75.0000 ug | ORAL_TABLET | Freq: Every day | ORAL | Status: DC

## 2012-06-04 MED ORDER — ZOLPIDEM TARTRATE 5 MG PO TABS: 5.0000 mg | ORAL_TABLET | Freq: Every evening | ORAL | Status: DC | PRN

## 2012-06-04 MED ORDER — AMLODIPINE BESYLATE 5 MG PO TABS: 5.0000 mg | ORAL_TABLET | Freq: Every day | ORAL | Status: DC

## 2012-06-04 MED ORDER — ATORVASTATIN CALCIUM 10 MG PO TABS: 10.0000 mg | ORAL_TABLET | Freq: Every day | ORAL | Status: DC

## 2012-06-04 MED ORDER — ZOSTER VACCINE LIVE 19400 UNT/0.65ML ~~LOC~~ SOLR: 0.6500 mL | Freq: Once | SUBCUTANEOUS | Status: DC

## 2012-06-04 MED ORDER — BENAZEPRIL-HYDROCHLOROTHIAZIDE 20-25 MG PO TABS: 1.0000 | ORAL_TABLET | Freq: Every day | ORAL | Status: DC

## 2012-06-04 NOTE — Assessment & Plan Note (Signed)
Check LDL and LFT's today since starting statin.

## 2012-06-04 NOTE — Assessment & Plan Note (Signed)
Resume Ambien.

## 2012-06-04 NOTE — Assessment & Plan Note (Signed)
Increase fiber and water, reluctant to start any more medication.

## 2012-06-04 NOTE — Assessment & Plan Note (Signed)
Check TSH 

## 2012-06-04 NOTE — Progress Notes (Signed)
  Subjective:    Patient ID: Yvonne [name], female    DOB: 1940/12/03, 71 y.o.   MRN: [id number]  HPI Here for f/u.  She has been doing well.  She is decreasing her frozen foods and increasing her fresh food intake.  She is not taking her amlodipine, unclear as to why.  She wonders if she misplaced it.  We were to repeat LDL and check LFT's since she started statin therapy last time.  Needs TSH. Would like zostavax.   Review of Systems  Constitutional: Negative for fever and chills.  HENT: Negative for nosebleeds, congestion and rhinorrhea.   Eyes: Negative for visual disturbance.  Respiratory: Negative for chest tightness and shortness of breath.   Cardiovascular: Negative for chest pain.  Gastrointestinal: Negative for nausea, vomiting, abdominal pain, diarrhea, constipation, blood in stool and abdominal distention.  Genitourinary: Negative for dysuria, frequency, vaginal bleeding and menstrual problem.  Musculoskeletal: Negative for arthralgias.  Skin: Negative for rash.  Neurological: Negative for dizziness and headaches.  Psychiatric/Behavioral: Negative for behavioral problems, sleep disturbance and dysphoric mood.  All other systems reviewed and are negative.       Objective:   Physical Exam  Vitals reviewed. Constitutional: She is oriented to person, place, and time. She appears well-developed and well-nourished. No distress.  HENT:  Head: Atraumatic.  Eyes: No scleral icterus.  Neck: Neck supple. No thyromegaly present.  Cardiovascular: Normal rate and regular rhythm.   Murmur (holosystolic) heard. Pulmonary/Chest: Effort normal and breath sounds normal.  Abdominal: Soft. There is no tenderness.  Musculoskeletal: Normal range of motion.  Neurological: She is alert and oriented to person, place, and time.  Skin: Skin is warm and dry. No rash noted.  Psychiatric: She has a normal mood and affect.          Assessment & Plan:

## 2012-06-04 NOTE — Patient Instructions (Addendum)
Insomnia Insomnia is frequent trouble falling and/or staying asleep. Insomnia can be a long term problem or a short term problem. Both are common. Insomnia can be a short term problem when the wakefulness is related to a certain stress or worry. Long term insomnia is often related to ongoing stress during waking hours and/or poor sleeping habits. Overtime, sleep deprivation itself can make the problem worse. Every little thing feels more severe because you are overtired and your ability to cope is decreased. CAUSES   Stress, anxiety, and depression.  Poor sleeping habits.  Distractions such as TV in the bedroom.  Naps close to bedtime.  Engaging in emotionally charged conversations before bed.  Technical reading before sleep.  Alcohol and other sedatives. They may make the problem worse. They can hurt normal sleep patterns and normal dream activity.  Stimulants such as caffeine for several hours prior to bedtime.  Pain syndromes and shortness of breath can cause insomnia.  Exercise late at night.  Changing time zones may cause sleeping problems (jet lag). It is sometimes helpful to have someone observe your sleeping patterns. They should look for periods of not breathing during the night (sleep apnea). They should also look to see how long those periods last. If you live alone or observers are uncertain, you can also be observed at a sleep clinic where your sleep patterns will be professionally monitored. Sleep apnea requires a checkup and treatment. Give your caregivers your medical history. Give your caregivers observations your family has made about your sleep.  SYMPTOMS   Not feeling rested in the morning.  Anxiety and restlessness at bedtime.  Difficulty falling and staying asleep. TREATMENT   Your caregiver may prescribe treatment for an underlying medical disorders. Your caregiver can give advice or help if you are using alcohol or other drugs for self-medication. Treatment  of underlying problems will usually eliminate insomnia problems.  Medications can be prescribed for short time use. They are generally not recommended for lengthy use.  Over-the-counter sleep medicines are not recommended for lengthy use. They can be habit forming.  You can promote easier sleeping by making lifestyle changes such as:  Using relaxation techniques that help with breathing and reduce muscle tension.  Exercising earlier in the day.  Changing your diet and the time of your last meal. No night time snacks.  Establish a regular time to go to bed.  Counseling can help with stressful problems and worry.  Soothing music and white noise may be helpful if there are background noises you cannot remove.  Stop tedious detailed work at least one hour before bedtime. HOME CARE INSTRUCTIONS   Keep a diary. Inform your caregiver about your progress. This includes any medication side effects. See your caregiver regularly. Take note of:  Times when you are asleep.  Times when you are awake during the night.  The quality of your sleep.  How you feel the next day. This information will help your caregiver care for you.  Get out of bed if you are still awake after 15 minutes. Read or do some quiet activity. Keep the lights down. Wait until you feel sleepy and go back to bed.  Keep regular sleeping and waking hours. Avoid naps.  Exercise regularly.  Avoid distractions at bedtime. Distractions include watching television or engaging in any intense or detailed activity like attempting to balance the household checkbook.  Develop a bedtime ritual. Keep a familiar routine of bathing, brushing your teeth, climbing into bed at the same   time each night, listening to soothing music. Routines increase the success of falling to sleep faster.  Use relaxation techniques. This can be using breathing and muscle tension release routines. It can also include visualizing peaceful scenes. You can  also help control troubling or intruding thoughts by keeping your mind occupied with boring or repetitive thoughts like the old concept of counting sheep. You can make it more creative like imagining planting one beautiful flower after another in your backyard garden.  During your day, work to eliminate stress. When this is not possible use some of the previous suggestions to help reduce the anxiety that accompanies stressful situations. MAKE SURE YOU:   Understand these instructions.  Will watch your condition.  Will get help right away if you are not doing well or get worse. Document Released: 06/01/2000 Document Revised: 08/27/2011 Document Reviewed: 07/02/2007 [facility] Patient Information 2013 [place], [place]. Hypertension As your heart beats, it forces blood through your arteries. This force is your blood pressure. If the pressure is too high, it is called hypertension (HTN) or high blood pressure. HTN is dangerous because you may have it and not know it. High blood pressure may mean that your heart has to work harder to pump blood. Your arteries may be narrow or stiff. The extra work puts you at risk for heart disease, stroke, and other problems.  Blood pressure consists of two numbers, a higher number over a lower, 110/72, for example. It is stated as "110 over 72." The ideal is below 120 for the top number (systolic) and under 80 for the bottom (diastolic). Write down your blood pressure today. You should pay close attention to your blood pressure if you have certain conditions such as:  Heart failure.  Prior heart attack.  Diabetes  Chronic kidney disease.  Prior stroke.  Multiple risk factors for heart disease. To see if you have HTN, your blood pressure should be measured while you are seated with your arm held at the level of the heart. It should be measured at least twice. A one-time elevated blood pressure reading (especially in the Emergency Department) does not mean that  you need treatment. There may be conditions in which the blood pressure is different between your right and left arms. It is important to see your caregiver soon for a recheck. Most people have essential hypertension which means that there is not a specific cause. This type of high blood pressure may be lowered by changing lifestyle factors such as:  Stress.  Smoking.  Lack of exercise.  Excessive weight.  Drug/tobacco/alcohol use.  Eating less salt. Most people do not have symptoms from high blood pressure until it has caused damage to the body. Effective treatment can often prevent, delay or reduce that damage. TREATMENT  When a cause has been identified, treatment for high blood pressure is directed at the cause. There are a large number of medications to treat HTN. These fall into several categories, and your caregiver will help you select the medicines that are best for you. Medications may have side effects. You should review side effects with your caregiver. If your blood pressure stays high after you have made lifestyle changes or started on medicines,   Your medication(s) may need to be changed.  Other problems may need to be addressed.  Be certain you understand your prescriptions, and know how and when to take your medicine.  Be sure to follow up with your caregiver within the time frame advised (usually within two weeks)  to have your blood pressure rechecked and to review your medications.  If you are taking more than one medicine to lower your blood pressure, make sure you know how and at what times they should be taken. Taking two medicines at the same time can result in blood pressure that is too low. SEEK IMMEDIATE MEDICAL CARE IF:  You develop a severe headache, blurred or changing vision, or confusion.  You have unusual weakness or numbness, or a faint feeling.  You have severe chest or abdominal pain, vomiting, or breathing problems. MAKE SURE YOU:   Understand  these instructions.  Will watch your condition.  Will get help right away if you are not doing well or get worse. Document Released: 06/04/2005 Document Revised: 08/27/2011 Document Reviewed: 01/23/2008 [facility] Patient Information 2013 [place], [place]. Constipation, Adult Constipation is when a person has fewer than 3 bowel movements a week; has difficulty having a bowel movement; or has stools that are dry, hard, or larger than normal. As people grow older, constipation is more common. If you try to fix constipation with medicines that make you have a bowel movement (laxatives), the problem may get worse. Long-term laxative use may cause the muscles of the colon to become weak. A low-fiber diet, not taking in enough fluids, and taking certain medicines may make constipation worse. CAUSES   Certain medicines, such as antidepressants, pain medicine, iron supplements, antacids, and water pills.   Certain diseases, such as diabetes, irritable bowel syndrome (IBS), thyroid disease, or depression.   Not drinking enough water.   Not eating enough fiber-rich foods.   Stress or travel.  Lack of physical activity or exercise.  Not going to the restroom when there is the urge to have a bowel movement.  Ignoring the urge to have a bowel movement.  Using laxatives too much. SYMPTOMS   Having fewer than 3 bowel movements a week.   Straining to have a bowel movement.   Having hard, dry, or larger than normal stools.   Feeling full or bloated.   Pain in the lower abdomen.  Not feeling relief after having a bowel movement. DIAGNOSIS  Your caregiver will take a medical history and perform a physical exam. Further testing may be done for severe constipation. Some tests may include:   A barium enema X-ray to examine your rectum, colon, and sometimes, your small intestine.  A sigmoidoscopy to examine your lower colon.  A colonoscopy to examine your entire colon. TREATMENT    Treatment will depend on the severity of your constipation and what is causing it. Some dietary treatments include drinking more fluids and eating more fiber-rich foods. Lifestyle treatments may include regular exercise. If these diet and lifestyle recommendations do not help, your caregiver may recommend taking over-the-counter laxative medicines to help you have bowel movements. Prescription medicines may be prescribed if over-the-counter medicines do not work.  HOME CARE INSTRUCTIONS   Increase dietary fiber in your diet, such as fruits, vegetables, whole grains, and beans. Limit high-fat and processed sugars in your diet, such as [place] fries, hamburgers, cookies, candies, and soda.   A fiber supplement may be added to your diet if you cannot get enough fiber from foods.   Drink enough fluids to keep your urine clear or pale yellow.   Exercise regularly or as directed by your caregiver.   Go to the restroom when you have the urge to go. Do not hold it.  Only take medicines as directed by your caregiver. Do not  take other medicines for constipation without talking to your caregiver first. SEEK IMMEDIATE MEDICAL CARE IF:   You have bright red blood in your stool.   Your constipation lasts for more than 4 days or gets worse.   You have abdominal or rectal pain.   You have thin, pencil-like stools.  You have unexplained weight loss. MAKE SURE YOU:   Understand these instructions.  Will watch your condition.  Will get help right away if you are not doing well or get worse. Document Released: 03/02/2004 Document Revised: 08/27/2011 Document Reviewed: 05/08/2011 [facility] Patient Information 2013 [place], [place].

## 2012-06-04 NOTE — Assessment & Plan Note (Signed)
Resume amlodipine. 

## 2012-06-05 ENCOUNTER — Encounter: Payer: Self-pay | Admitting: Family Medicine

## 2012-06-05 LAB — COMPREHENSIVE METABOLIC PANEL
AST: 15 U/L (ref 0–37)
Alkaline Phosphatase: 84 U/L (ref 39–117)
BUN: 17 mg/dL (ref 6–23)
Calcium: 9.7 mg/dL (ref 8.4–10.5)
Creat: 0.64 mg/dL (ref 0.50–1.10)

## 2012-06-09 ENCOUNTER — Other Ambulatory Visit (HOSPITAL_COMMUNITY): Payer: Medicare HMO

## 2012-07-10 ENCOUNTER — Ambulatory Visit (HOSPITAL_COMMUNITY)
Admission: RE | Admit: 2012-07-10 | Discharge: 2012-07-10 | Disposition: A | Discharge status: Home / Self Care | Payer: Medicare HMO | Source: Ambulatory Visit | Attending: Family Medicine | Admitting: Family Medicine

## 2012-07-10 DIAGNOSIS — Z13820 Encounter for screening for osteoporosis: Secondary | ICD-10-CM | POA: Insufficient documentation

## 2012-07-10 DIAGNOSIS — Z780 Asymptomatic menopausal state: Secondary | ICD-10-CM | POA: Insufficient documentation

## 2012-07-12 ENCOUNTER — Encounter: Payer: Self-pay | Admitting: Family Medicine

## 2012-07-30 ENCOUNTER — Telehealth: Payer: Self-pay | Admitting: Family Medicine

## 2012-07-30 NOTE — Telephone Encounter (Signed)
Patient dropped off a paper with medications listed that she needs refills on.  I placed the paper in dr's box.  Please call her if any questions.

## 2012-08-04 ENCOUNTER — Telehealth: Payer: Self-pay | Admitting: Family Medicine

## 2012-08-04 DIAGNOSIS — E039 Hypothyroidism, unspecified: Secondary | ICD-10-CM

## 2012-08-04 DIAGNOSIS — I10 Essential (primary) hypertension: Secondary | ICD-10-CM

## 2012-08-04 DIAGNOSIS — E7800 Pure hypercholesterolemia, unspecified: Secondary | ICD-10-CM

## 2012-08-04 DIAGNOSIS — G4700 Insomnia, unspecified: Secondary | ICD-10-CM

## 2012-08-04 DIAGNOSIS — F419 Anxiety disorder, unspecified: Secondary | ICD-10-CM

## 2012-08-04 NOTE — Telephone Encounter (Signed)
Patient is calling because she dropped off a list of the medications she needs, she says she hasn't slept in 15 days and needs these medications.  The list is in Dr. Virl Cagey box.  I did ask the patient if she contacted her pharmacy and she said that they have told her to call Dr. Shawnie Pons.

## 2012-08-05 MED ORDER — LEVOTHYROXINE SODIUM 75 MCG PO TABS: 75.0000 ug | ORAL_TABLET | Freq: Every day | ORAL | Status: DC

## 2012-08-05 MED ORDER — ATORVASTATIN CALCIUM 10 MG PO TABS: 10.0000 mg | ORAL_TABLET | Freq: Every day | ORAL | Status: DC

## 2012-08-05 MED ORDER — AMITRIPTYLINE HCL 50 MG PO TABS: 100.0000 mg | ORAL_TABLET | Freq: Every day | ORAL | Status: DC

## 2012-08-05 MED ORDER — BENAZEPRIL-HYDROCHLOROTHIAZIDE 20-25 MG PO TABS: 1.0000 | ORAL_TABLET | Freq: Every day | ORAL | Status: DC

## 2012-08-05 MED ORDER — ZOLPIDEM TARTRATE 5 MG PO TABS: 5.0000 mg | ORAL_TABLET | Freq: Every evening | ORAL | Status: DC | PRN

## 2012-08-05 MED ORDER — AMLODIPINE BESYLATE 5 MG PO TABS: 5.0000 mg | ORAL_TABLET | Freq: Every day | ORAL | Status: DC

## 2012-08-05 NOTE — Telephone Encounter (Signed)
Rxs called in verbally to [facility] and LMOVM informing pt. Yvonne [name], [name]

## 2012-08-25 ENCOUNTER — Ambulatory Visit: Payer: Medicare HMO | Admitting: Family Medicine

## 2012-09-10 ENCOUNTER — Telehealth: Payer: Self-pay | Admitting: Cardiovascular Disease

## 2012-09-10 NOTE — Telephone Encounter (Deleted)
New problem   Pt calling to schedule carotid per ltr recv'd

## 2012-09-19 ENCOUNTER — Encounter (INDEPENDENT_AMBULATORY_CARE_PROVIDER_SITE_OTHER): Payer: Medicare HMO

## 2012-09-19 DIAGNOSIS — I6529 Occlusion and stenosis of unspecified carotid artery: Secondary | ICD-10-CM

## 2012-10-16 ENCOUNTER — Encounter: Payer: Self-pay | Admitting: Family Medicine

## 2012-10-16 ENCOUNTER — Ambulatory Visit (INDEPENDENT_AMBULATORY_CARE_PROVIDER_SITE_OTHER): Payer: Medicare HMO | Admitting: Family Medicine

## 2012-10-16 VITALS — BP 127/79 | HR 77 | Ht 62.0 in | Wt 146.6 lb

## 2012-10-16 DIAGNOSIS — M1990 Unspecified osteoarthritis, unspecified site: Secondary | ICD-10-CM

## 2012-10-16 DIAGNOSIS — R200 Anesthesia of skin: Secondary | ICD-10-CM | POA: Insufficient documentation

## 2012-10-16 DIAGNOSIS — I10 Essential (primary) hypertension: Secondary | ICD-10-CM

## 2012-10-16 DIAGNOSIS — F411 Generalized anxiety disorder: Secondary | ICD-10-CM

## 2012-10-16 DIAGNOSIS — E7800 Pure hypercholesterolemia, unspecified: Secondary | ICD-10-CM

## 2012-10-16 DIAGNOSIS — E039 Hypothyroidism, unspecified: Secondary | ICD-10-CM

## 2012-10-16 DIAGNOSIS — R209 Unspecified disturbances of skin sensation: Secondary | ICD-10-CM

## 2012-10-16 DIAGNOSIS — K5900 Constipation, unspecified: Secondary | ICD-10-CM

## 2012-10-16 DIAGNOSIS — G4700 Insomnia, unspecified: Secondary | ICD-10-CM

## 2012-10-16 LAB — LIPID PANEL: LDL Cholesterol: 68 mg/dL (ref 0–99)

## 2012-10-16 LAB — VITAMIN B12: Vitamin B-12: 579 pg/mL (ref 211–911)

## 2012-10-16 MED ORDER — BENAZEPRIL-HYDROCHLOROTHIAZIDE 20-25 MG PO TABS: 1.0000 | ORAL_TABLET | Freq: Every day | ORAL | Status: DC

## 2012-10-16 MED ORDER — CHOLECALCIFEROL 10 MCG (400 UNIT) PO TABS: 1000.0000 [IU] | ORAL_TABLET | Freq: Every day | ORAL | Status: DC

## 2012-10-16 MED ORDER — AMITRIPTYLINE HCL 50 MG PO TABS: 100.0000 mg | ORAL_TABLET | Freq: Every day | ORAL | Status: DC

## 2012-10-16 MED ORDER — AMLODIPINE BESYLATE 5 MG PO TABS: 5.0000 mg | ORAL_TABLET | Freq: Every day | ORAL | Status: DC

## 2012-10-16 MED ORDER — ZOLPIDEM TARTRATE 5 MG PO TABS: 5.0000 mg | ORAL_TABLET | Freq: Every evening | ORAL | Status: DC | PRN

## 2012-10-16 MED ORDER — ASPIRIN 81 MG PO TBEC: 81.0000 mg | DELAYED_RELEASE_TABLET | Freq: Every day | ORAL | Status: DC

## 2012-10-16 MED ORDER — LEVOTHYROXINE SODIUM 75 MCG PO TABS: 75.0000 ug | ORAL_TABLET | Freq: Every day | ORAL | Status: DC

## 2012-10-16 MED ORDER — EQ FIBER POWDER PO POWD: 1.0000 | Freq: Every day | ORAL | Status: DC

## 2012-10-16 MED ORDER — ATORVASTATIN CALCIUM 10 MG PO TABS: 10.0000 mg | ORAL_TABLET | Freq: Every day | ORAL | Status: DC

## 2012-10-16 NOTE — Patient Instructions (Addendum)
Hypertension As your heart beats, it forces blood through your arteries. This force is your blood pressure. If the pressure is too high, it is called hypertension (HTN) or high blood pressure. HTN is dangerous because you may have it and not know it. High blood pressure may mean that your heart has to work harder to pump blood. Your arteries may be narrow or stiff. The extra work puts you at risk for heart disease, stroke, and other problems.  Blood pressure consists of two numbers, a higher number over a lower, 110/72, for example. It is stated as "110 over 72." The ideal is below 120 for the top number (systolic) and under 80 for the bottom (diastolic). Write down your blood pressure today. You should pay close attention to your blood pressure if you have certain conditions such as:  Heart failure.  Prior heart attack.  Diabetes  Chronic kidney disease.  Prior stroke.  Multiple risk factors for heart disease. To see if you have HTN, your blood pressure should be measured while you are seated with your arm held at the level of the heart. It should be measured at least twice. A one-time elevated blood pressure reading (especially in the Emergency Department) does not mean that you need treatment. There may be conditions in which the blood pressure is different between your right and left arms. It is important to see your caregiver soon for a recheck. Most people have essential hypertension which means that there is not a specific cause. This type of high blood pressure may be lowered by changing lifestyle factors such as:  Stress.  Smoking.  Lack of exercise.  Excessive weight.  Drug/tobacco/alcohol use.  Eating less salt. Most people do not have symptoms from high blood pressure until it has caused damage to the body. Effective treatment can often prevent, delay or reduce that damage. TREATMENT  When a cause has been identified, treatment for high blood pressure is directed at the  cause. There are a large number of medications to treat HTN. These fall into several categories, and your caregiver will help you select the medicines that are best for you. Medications may have side effects. You should review side effects with your caregiver. If your blood pressure stays high after you have made lifestyle changes or started on medicines,   Your medication(s) may need to be changed.  Other problems may need to be addressed.  Be certain you understand your prescriptions, and know how and when to take your medicine.  Be sure to follow up with your caregiver within the time frame advised (usually within two weeks) to have your blood pressure rechecked and to review your medications.  If you are taking more than one medicine to lower your blood pressure, make sure you know how and at what times they should be taken. Taking two medicines at the same time can result in blood pressure that is too low. SEEK IMMEDIATE MEDICAL CARE IF:  You develop a severe headache, blurred or changing vision, or confusion.  You have unusual weakness or numbness, or a faint feeling.  You have severe chest or abdominal pain, vomiting, or breathing problems. MAKE SURE YOU:   Understand these instructions.  Will watch your condition.  Will get help right away if you are not doing well or get worse. Document Released: 06/04/2005 Document Revised: 08/27/2011 Document Reviewed: 01/23/2008 [facility] Patient Information 2013 [place], [place]. Vitamin D Deficiency Vitamin D is an important vitamin that your body needs. Having too little  of it in your body is called a deficiency. A very bad deficiency can make your bones soft and can cause a condition called rickets.  Vitamin D is important to your body for different reasons, such as:   It helps your body absorb 2 minerals called calcium and phosphorus.  It helps make your bones healthy.  It may prevent some diseases, such as diabetes and multiple  sclerosis.  It helps your muscles and heart. You can get vitamin D in several ways. It is a natural part of some foods. The vitamin is also added to some dairy products and cereals. Some people take vitamin D supplements. Also, your body makes vitamin D when you are in the sun. It changes the sun's rays into a form of the vitamin that your body can use. CAUSES   Not eating enough foods that contain vitamin D.  Not getting enough sunlight.  Having certain digestive system diseases that make it hard to absorb vitamin D. These diseases include Crohn's disease, chronic pancreatitis, and cystic fibrosis.  Having a surgery in which part of the stomach or small intestine is removed.  Being obese. Fat cells pull vitamin D out of your blood. That means that obese people may not have enough vitamin D left in their blood and in other body tissues.  Having chronic kidney or liver disease. RISK FACTORS Risk factors are things that make you more likely to develop a vitamin D deficiency. They include:  Being older.  Not being able to get outside very much.  Living in a nursing home.  Having had broken bones.  Having weak or thin bones (osteoporosis).  Having a disease or condition that changes how your body absorbs vitamin D.  Having dark skin.  Some medicines such as seizure medicines or steroids.  Being overweight or obese. SYMPTOMS Mild cases of vitamin D deficiency may not have any symptoms. If you have a very bad case, symptoms may include:  Bone pain.  Muscle pain.  Falling often.  Broken bones caused by a minor injury, due to osteoporosis. DIAGNOSIS A blood test is the best way to tell if you have a vitamin D deficiency. TREATMENT Vitamin D deficiency can be treated in different ways. Treatment for vitamin D deficiency depends on what is causing it. Options include:  Taking vitamin D supplements.  Taking a calcium supplement. Your caregiver will suggest what dose is best  for you. HOME CARE INSTRUCTIONS  Take any supplements that your caregiver prescribes. Follow the directions carefully. Take only the suggested amount.  Have your blood tested 2 months after you start taking supplements.  Eat foods that contain vitamin D. Healthy choices include:  Fortified dairy products, cereals, or juices. Fortified means vitamin D has been added to the food. Check the label on the package to be sure.  Fatty fish like salmon or trout.  Eggs.  Oysters.  Spend some time in the sun. Most people should get out in the sun without sunblock for 10 to 15 minutes at a time. Do this 3 times a week. People who have had skin cancer should not do this. Ask your caregiver how long you should be in the sun. Do not use a tanning bed.  Keep your weight at a healthy level. Lose weight if you need to.  Keep all follow-up appointments. Your caregiver will need to perform blood tests to make sure your vitamin D deficiency is going away. SEEK MEDICAL CARE IF:  You have any questions  about your treatment.  You continue to have symptoms of vitamin D deficiency.  You have nausea or vomiting.  You are constipated.  You feel confused.  You have severe abdominal or back pain. MAKE SURE YOU:  Understand these instructions.  Will watch your condition.  Will get help right away if you are not doing well or get worse. Document Released: 08/27/2011 Document Reviewed: 08/27/2011 [facility] Patient Information 2013 [place], [place].

## 2012-10-18 NOTE — Assessment & Plan Note (Signed)
Check B 12.  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

## 2012-10-18 NOTE — Assessment & Plan Note (Signed)
Continue Fiber

## 2012-10-18 NOTE — Progress Notes (Signed)
  Subjective:    Patient ID: Yvonne [name], female    DOB: 02/06/1941, 72 y.o.   MRN: [id number]  Hypertension Pertinent negatives include no chest pain or shortness of breath.    Doing well.  Here for f/u with following complaints. 1.  Numbness-feeling of pins and needles with walking in lower extremeties bilaterally.  No weakness or falling noted. 2.  Needs medication refill.   Review of Systems  Constitutional: Negative for fever, activity change and fatigue.  Respiratory: Negative for cough and shortness of breath.   Cardiovascular: Negative for chest pain and leg swelling.  Gastrointestinal: Positive for constipation. Negative for nausea, vomiting, abdominal pain and diarrhea.  Genitourinary: Negative for dysuria and vaginal discharge.  Musculoskeletal: Negative for back pain, joint swelling and arthralgias.  Skin: Negative for rash.  Neurological: Positive for numbness. Negative for dizziness and tremors.       Objective:   Physical Exam  Vitals reviewed. Constitutional: She is oriented to person, place, and time. She appears well-developed and well-nourished.  HENT:  Head: Normocephalic and atraumatic.  Eyes: No scleral icterus.  Neck: Neck supple.  Cardiovascular: Normal rate.   Murmur heard. Pulmonary/Chest: No respiratory distress.  Abdominal: Soft. There is no tenderness.  Musculoskeletal: Normal range of motion. She exhibits no edema and no tenderness.  Neurological: She is alert and oriented to person, place, and time. She has normal strength. She displays normal reflexes. No sensory deficit. She exhibits normal muscle tone.  Reflex Scores:      Patellar reflexes are 2+ on the right side and 2+ on the left side.         Assessment & Plan:

## 2012-10-18 NOTE — Assessment & Plan Note (Signed)
Medication refill--BP looks good today.

## 2012-10-18 NOTE — Assessment & Plan Note (Signed)
Recheck levels today--adjust medication prn.

## 2012-10-18 NOTE — Assessment & Plan Note (Signed)
Medication refill--has been stable.

## 2012-10-18 NOTE — Assessment & Plan Note (Signed)
Ambien is working well for her.  Will refill.

## 2012-10-20 ENCOUNTER — Encounter: Payer: Self-pay | Admitting: Family Medicine

## 2012-11-06 ENCOUNTER — Telehealth: Payer: Self-pay | Admitting: Family Medicine

## 2012-11-06 DIAGNOSIS — G4700 Insomnia, unspecified: Secondary | ICD-10-CM

## 2012-11-06 NOTE — Telephone Encounter (Signed)
Will fwd to MD.  Nayvie Lips L, CMA  

## 2012-11-06 NOTE — Telephone Encounter (Signed)
Patient is needing a rx for Ambien sent to [facility].

## 2012-11-07 MED ORDER — ZOLPIDEM TARTRATE 5 MG PO TABS: 5.0000 mg | ORAL_TABLET | Freq: Every evening | ORAL | Status: DC | PRN

## 2012-11-07 NOTE — Telephone Encounter (Signed)
Rx called in to [facility] and pt notified.  Yvonne [name], Darlyne Russian, CMA

## 2012-11-12 ENCOUNTER — Telehealth: Payer: Self-pay | Admitting: *Deleted

## 2012-11-12 NOTE — Telephone Encounter (Signed)
Received prior authorization form from [facility] for Ambien.  Form faxed to Dr. Shawnie Pons at [facility] .Wyatt Haste, RN-BSN

## 2012-11-13 NOTE — Telephone Encounter (Signed)
Received prior authorization that Zolpidem 5 mg 90/90 has been approved until 11/13/2013. Wyatt Haste, RN-BSN

## 2013-05-06 ENCOUNTER — Encounter: Payer: Self-pay | Admitting: Family Medicine

## 2013-05-06 ENCOUNTER — Ambulatory Visit (INDEPENDENT_AMBULATORY_CARE_PROVIDER_SITE_OTHER): Payer: Commercial Managed Care - HMO | Admitting: Family Medicine

## 2013-05-06 VITALS — BP 149/83 | HR 87 | Temp 97.8°F | Wt 147.0 lb

## 2013-05-06 DIAGNOSIS — I10 Essential (primary) hypertension: Secondary | ICD-10-CM

## 2013-05-06 DIAGNOSIS — E039 Hypothyroidism, unspecified: Secondary | ICD-10-CM

## 2013-05-06 DIAGNOSIS — Z1239 Encounter for other screening for malignant neoplasm of breast: Secondary | ICD-10-CM

## 2013-05-06 DIAGNOSIS — F411 Generalized anxiety disorder: Secondary | ICD-10-CM

## 2013-05-06 DIAGNOSIS — G4700 Insomnia, unspecified: Secondary | ICD-10-CM

## 2013-05-06 DIAGNOSIS — L723 Sebaceous cyst: Secondary | ICD-10-CM

## 2013-05-06 MED ORDER — ATORVASTATIN CALCIUM 10 MG PO TABS: 10.0000 mg | ORAL_TABLET | Freq: Every day | ORAL | Status: DC

## 2013-05-06 MED ORDER — AMLODIPINE BESYLATE 5 MG PO TABS: 5.0000 mg | ORAL_TABLET | Freq: Every day | ORAL | Status: DC

## 2013-05-06 MED ORDER — AMITRIPTYLINE HCL 50 MG PO TABS: 100.0000 mg | ORAL_TABLET | Freq: Every day | ORAL | Status: DC

## 2013-05-06 MED ORDER — ASPIRIN 81 MG PO TBEC: 81.0000 mg | DELAYED_RELEASE_TABLET | Freq: Every day | ORAL | Status: DC

## 2013-05-06 MED ORDER — AMITRIPTYLINE HCL 50 MG PO TABS: 150.0000 mg | ORAL_TABLET | Freq: Every day | ORAL | Status: DC

## 2013-05-06 MED ORDER — LEVOTHYROXINE SODIUM 75 MCG PO TABS: 75.0000 ug | ORAL_TABLET | Freq: Every day | ORAL | Status: DC

## 2013-05-06 MED ORDER — BENAZEPRIL-HYDROCHLOROTHIAZIDE 20-25 MG PO TABS: 1.0000 | ORAL_TABLET | Freq: Every day | ORAL | Status: DC

## 2013-05-06 NOTE — Patient Instructions (Signed)
Abscess An abscess is an infected area that contains a collection of pus and debris.It can occur in almost any part of the body. An abscess is also known as a furuncle or boil. CAUSES  An abscess occurs when tissue gets infected. This can occur from blockage of oil or sweat glands, infection of hair follicles, or a minor injury to the skin. As the body tries to fight the infection, pus collects in the area and creates pressure under the skin. This pressure causes pain. People with weakened immune systems have difficulty fighting infections and get certain abscesses more often.  SYMPTOMS Usually an abscess develops on the skin and becomes a painful mass that is red, warm, and tender. If the abscess forms under the skin, you may feel a moveable soft area under the skin. Some abscesses break open (rupture) on their own, but most will continue to get worse without care. The infection can spread deeper into the body and eventually into the bloodstream, causing you to feel ill.  DIAGNOSIS  Your caregiver will take your medical history and perform a physical exam. A sample of fluid may also be taken from the abscess to determine what is causing your infection. TREATMENT  Your caregiver may prescribe antibiotic medicines to fight the infection. However, taking antibiotics alone usually does not cure an abscess. Your caregiver may need to make a small cut (incision) in the abscess to drain the pus. In some cases, gauze is packed into the abscess to reduce pain and to continue draining the area. HOME CARE INSTRUCTIONS   Only take over-the-counter or prescription medicines for pain, discomfort, or fever as directed by your caregiver.  If you were prescribed antibiotics, take them as directed. Finish them even if you start to feel better.  If gauze is used, follow your caregiver's directions for changing the gauze.  To avoid spreading the infection:  Keep your draining abscess covered with a  bandage.  Wash your hands well.  Do not share personal care items, towels, or whirlpools with others.  Avoid skin contact with others.  Keep your skin and clothes clean around the abscess.  Keep all follow-up appointments as directed by your caregiver. SEEK MEDICAL CARE IF:   You have increased pain, swelling, redness, fluid drainage, or bleeding.  You have muscle aches, chills, or a general ill feeling.  You have a fever. MAKE SURE YOU:   Understand these instructions.  Will watch your condition.  Will get help right away if you are not doing well or get worse. Document Released: 03/14/2005 Document Revised: 12/04/2011 Document Reviewed: 08/17/2011 [facility] Patient Information 2014 [place], [place]. Hypertension As your heart beats, it forces blood through your arteries. This force is your blood pressure. If the pressure is too high, it is called hypertension (HTN) or high blood pressure. HTN is dangerous because you may have it and not know it. High blood pressure may mean that your heart has to work harder to pump blood. Your arteries may be narrow or stiff. The extra work puts you at risk for heart disease, stroke, and other problems.  Blood pressure consists of two numbers, a higher number over a lower, 110/72, for example. It is stated as "110 over 72." The ideal is below 120 for the top number (systolic) and under 80 for the bottom (diastolic). Write down your blood pressure today. You should pay close attention to your blood pressure if you have certain conditions such as:  Heart failure.  Prior heart attack.  Diabetes  Chronic kidney disease.  Prior stroke.  Multiple risk factors for heart disease. To see if you have HTN, your blood pressure should be measured while you are seated with your arm held at the level of the heart. It should be measured at least twice. A one-time elevated blood pressure reading (especially in the Emergency Department) does not mean that  you need treatment. There may be conditions in which the blood pressure is different between your right and left arms. It is important to see your caregiver soon for a recheck. Most people have essential hypertension which means that there is not a specific cause. This type of high blood pressure may be lowered by changing lifestyle factors such as:  Stress.  Smoking.  Lack of exercise.  Excessive weight.  Drug/tobacco/alcohol use.  Eating less salt. Most people do not have symptoms from high blood pressure until it has caused damage to the body. Effective treatment can often prevent, delay or reduce that damage. TREATMENT  When a cause has been identified, treatment for high blood pressure is directed at the cause. There are a large number of medications to treat HTN. These fall into several categories, and your caregiver will help you select the medicines that are best for you. Medications may have side effects. You should review side effects with your caregiver. If your blood pressure stays high after you have made lifestyle changes or started on medicines,   Your medication(s) may need to be changed.  Other problems may need to be addressed.  Be certain you understand your prescriptions, and know how and when to take your medicine.  Be sure to follow up with your caregiver within the time frame advised (usually within two weeks) to have your blood pressure rechecked and to review your medications.  If you are taking more than one medicine to lower your blood pressure, make sure you know how and at what times they should be taken. Taking two medicines at the same time can result in blood pressure that is too low. SEEK IMMEDIATE MEDICAL CARE IF:  You develop a severe headache, blurred or changing vision, or confusion.  You have unusual weakness or numbness, or a faint feeling.  You have severe chest or abdominal pain, vomiting, or breathing problems. MAKE SURE YOU:   Understand  these instructions.  Will watch your condition.  Will get help right away if you are not doing well or get worse. Document Released: 06/04/2005 Document Revised: 08/27/2011 Document Reviewed: 01/23/2008 [facility] Patient Information 2014 [place], [place].

## 2013-05-07 DIAGNOSIS — L723 Sebaceous cyst: Secondary | ICD-10-CM | POA: Insufficient documentation

## 2013-05-07 NOTE — Assessment & Plan Note (Signed)
meds refilled 

## 2013-05-07 NOTE — Progress Notes (Signed)
  Subjective:    Patient ID: Yvonne [name], female    DOB: 01-21-1941, 72 y.o.   MRN: [id number]  HPI Returns for f/u.  Out of medications.  Desires refill.  Would like to increase her Elavil to 150 mg q hs again, helps her sleep better.  Needs Mammogram and had flu shot at [facility]. Has know on head x few month.  Reports had similar knot when had brain cancer. Was added to groceries on wheels and now with food stamps is able to have enough food to eat for the month.  Review of Systems  Constitutional: Negative for fever and chills.  Respiratory: Negative for shortness of breath.   Gastrointestinal: Negative for abdominal pain.  Musculoskeletal: Negative for arthralgias.       Objective:   Physical Exam  Vitals reviewed. Constitutional: She appears well-developed and well-nourished. No distress.  HENT:  Head: Normocephalic and atraumatic.  Neck: Neck supple.  Cardiovascular: Normal rate.   Pulmonary/Chest: Effort normal.  Abdominal: Soft.  Skin: Skin is warm. Rash noted. Rash is nodular and pustular. There is erythema.             Assessment & Plan:  Schedule mammogram Blood work at next visit.

## 2013-05-07 NOTE — Assessment & Plan Note (Signed)
Will book return for Incision and drainage.

## 2013-05-07 NOTE — Assessment & Plan Note (Signed)
BP ok today

## 2013-05-07 NOTE — Assessment & Plan Note (Signed)
Med refill

## 2013-05-18 ENCOUNTER — Telehealth: Payer: Self-pay | Admitting: *Deleted

## 2013-05-18 DIAGNOSIS — G4700 Insomnia, unspecified: Secondary | ICD-10-CM

## 2013-05-18 MED ORDER — ZOLPIDEM TARTRATE 5 MG PO TABS: 5.0000 mg | ORAL_TABLET | Freq: Every evening | ORAL | Status: DC | PRN

## 2013-05-18 NOTE — Telephone Encounter (Signed)
Rx called into [facility] ([phone]), per Dr. Shawnie Pons.

## 2013-06-03 ENCOUNTER — Ambulatory Visit (INDEPENDENT_AMBULATORY_CARE_PROVIDER_SITE_OTHER): Payer: Medicare HMO | Admitting: Family Medicine

## 2013-06-03 ENCOUNTER — Encounter: Payer: Self-pay | Admitting: Family Medicine

## 2013-06-03 VITALS — BP 136/78 | HR 82 | Temp 98.0°F | Ht 62.0 in | Wt 152.0 lb

## 2013-06-03 DIAGNOSIS — L723 Sebaceous cyst: Secondary | ICD-10-CM

## 2013-06-03 NOTE — Patient Instructions (Signed)

## 2013-06-03 NOTE — Progress Notes (Signed)
   Subjective:    Patient ID: Yvonne [name], female    DOB: August 14, 1940, 72 y.o.   MRN: [id number]  HPI Here for cyst removal.  Has been in place x 6 months.  Has gotten bigger and now smaller.   Review of Systems  Constitutional: Negative for fever and chills.  Respiratory: Negative for shortness of breath.   Cardiovascular: Negative for chest pain.  Gastrointestinal: Negative for abdominal pain.       Objective:   Physical Exam  Vitals reviewed. Constitutional: She appears well-developed and well-nourished.  Eyes: No scleral icterus.  Neck: Neck supple.  Pulmonary/Chest: She is in respiratory distress.  Skin:  Small sub-centimeter, non-erythematous lesion with punctation noted.   Procedure: After informed consent, area cleaned with alcohol.  1.5 cc of Lidocaine with Epinephrine infiltrated.  Scalpel used to make a small incision and cyst entered.  Attempted to remove cyst wall and mostly successful.  Single 5-0 vicryl used for closure.  Minimal bleeding.  Pt. Tolerated procedure well.        Assessment & Plan:

## 2013-06-03 NOTE — Assessment & Plan Note (Signed)
S/p removal.   

## 2013-08-04 ENCOUNTER — Encounter: Payer: Self-pay | Admitting: *Deleted

## 2013-11-02 ENCOUNTER — Encounter: Payer: Self-pay | Admitting: Family Medicine

## 2013-11-02 ENCOUNTER — Ambulatory Visit (INDEPENDENT_AMBULATORY_CARE_PROVIDER_SITE_OTHER): Payer: Commercial Managed Care - HMO | Admitting: Family Medicine

## 2013-11-02 VITALS — BP 120/69 | HR 80 | Temp 98.6°F | Ht 62.0 in | Wt 155.0 lb

## 2013-11-02 DIAGNOSIS — I10 Essential (primary) hypertension: Secondary | ICD-10-CM

## 2013-11-02 DIAGNOSIS — G4700 Insomnia, unspecified: Secondary | ICD-10-CM

## 2013-11-02 DIAGNOSIS — E7800 Pure hypercholesterolemia, unspecified: Secondary | ICD-10-CM

## 2013-11-02 DIAGNOSIS — I062 Rheumatic aortic stenosis with insufficiency: Secondary | ICD-10-CM

## 2013-11-02 DIAGNOSIS — E039 Hypothyroidism, unspecified: Secondary | ICD-10-CM

## 2013-11-02 DIAGNOSIS — F411 Generalized anxiety disorder: Secondary | ICD-10-CM

## 2013-11-02 LAB — CBC
HCT: 37.1 % (ref 36.0–46.0)
Hemoglobin: 13 g/dL (ref 12.0–15.0)
MCH: 29.7 pg (ref 26.0–34.0)
MCHC: 35 g/dL (ref 30.0–36.0)
MCV: 84.9 fL (ref 78.0–100.0)
PLATELETS: 417 10*3/uL — AB (ref 150–400)
RBC: 4.37 MIL/uL (ref 3.87–5.11)
RDW: 13.7 % (ref 11.5–15.5)
WBC: 10.8 10*3/uL — ABNORMAL HIGH (ref 4.0–10.5)

## 2013-11-02 MED ORDER — ATORVASTATIN CALCIUM 10 MG PO TABS: 10.0000 mg | ORAL_TABLET | Freq: Every day | ORAL | Status: DC

## 2013-11-02 MED ORDER — ASPIRIN 81 MG PO TBEC: 81.0000 mg | DELAYED_RELEASE_TABLET | Freq: Every day | ORAL | Status: DC

## 2013-11-02 MED ORDER — BENAZEPRIL-HYDROCHLOROTHIAZIDE 20-25 MG PO TABS: 1.0000 | ORAL_TABLET | Freq: Every day | ORAL | Status: DC

## 2013-11-02 MED ORDER — LEVOTHYROXINE SODIUM 75 MCG PO TABS: 75.0000 ug | ORAL_TABLET | Freq: Every day | ORAL | Status: DC

## 2013-11-02 MED ORDER — AMITRIPTYLINE HCL 50 MG PO TABS: 150.0000 mg | ORAL_TABLET | Freq: Every day | ORAL | Status: DC

## 2013-11-02 MED ORDER — ZOLPIDEM TARTRATE 5 MG PO TABS: 5.0000 mg | ORAL_TABLET | Freq: Every evening | ORAL | Status: DC | PRN

## 2013-11-02 MED ORDER — AMLODIPINE BESYLATE 5 MG PO TABS: 5.0000 mg | ORAL_TABLET | Freq: Every day | ORAL | Status: DC

## 2013-11-02 NOTE — Assessment & Plan Note (Signed)
Check TSH 

## 2013-11-02 NOTE — Patient Instructions (Signed)
Insomnia Insomnia is frequent trouble falling and/or staying asleep. Insomnia can be a long term problem or a short term problem. Both are common. Insomnia can be a short term problem when the wakefulness is related to a certain stress or worry. Long term insomnia is often related to ongoing stress during waking hours and/or poor sleeping habits. Overtime, sleep deprivation itself can make the problem worse. Every little thing feels more severe because you are overtired and your ability to cope is decreased. CAUSES   Stress, anxiety, and depression.  Poor sleeping habits.  Distractions such as TV in the bedroom.  Naps close to bedtime.  Engaging in emotionally charged conversations before bed.  Technical reading before sleep.  Alcohol and other sedatives. They may make the problem worse. They can hurt normal sleep patterns and normal dream activity.  Stimulants such as caffeine for several hours prior to bedtime.  Pain syndromes and shortness of breath can cause insomnia.  Exercise late at night.  Changing time zones may cause sleeping problems (jet lag). It is sometimes helpful to have someone observe your sleeping patterns. They should look for periods of not breathing during the night (sleep apnea). They should also look to see how long those periods last. If you live alone or observers are uncertain, you can also be observed at a sleep clinic where your sleep patterns will be professionally monitored. Sleep apnea requires a checkup and treatment. Give your caregivers your medical history. Give your caregivers observations your family has made about your sleep.  SYMPTOMS   Not feeling rested in the morning.  Anxiety and restlessness at bedtime.  Difficulty falling and staying asleep. TREATMENT   Your caregiver may prescribe treatment for an underlying medical disorders. Your caregiver can give advice or help if you are using alcohol or other drugs for self-medication. Treatment  of underlying problems will usually eliminate insomnia problems.  Medications can be prescribed for short time use. They are generally not recommended for lengthy use.  Over-the-counter sleep medicines are not recommended for lengthy use. They can be habit forming.  You can promote easier sleeping by making lifestyle changes such as:  Using relaxation techniques that help with breathing and reduce muscle tension.  Exercising earlier in the day.  Changing your diet and the time of your last meal. No night time snacks.  Establish a regular time to go to bed.  Counseling can help with stressful problems and worry.  Soothing music and white noise may be helpful if there are background noises you cannot remove.  Stop tedious detailed work at least one hour before bedtime. HOME CARE INSTRUCTIONS   Keep a diary. Inform your caregiver about your progress. This includes any medication side effects. See your caregiver regularly. Take note of:  Times when you are asleep.  Times when you are awake during the night.  The quality of your sleep.  How you feel the next day. This information will help your caregiver care for you.  Get out of bed if you are still awake after 15 minutes. Read or do some quiet activity. Keep the lights down. Wait until you feel sleepy and go back to bed.  Keep regular sleeping and waking hours. Avoid naps.  Exercise regularly.  Avoid distractions at bedtime. Distractions include watching television or engaging in any intense or detailed activity like attempting to balance the household checkbook.  Develop a bedtime ritual. Keep a familiar routine of bathing, brushing your teeth, climbing into bed at the same   time each night, listening to soothing music. Routines increase the success of falling to sleep faster.  Use relaxation techniques. This can be using breathing and muscle tension release routines. It can also include visualizing peaceful scenes. You can  also help control troubling or intruding thoughts by keeping your mind occupied with boring or repetitive thoughts like the old concept of counting sheep. You can make it more creative like imagining planting one beautiful flower after another in your backyard garden.  During your day, work to eliminate stress. When this is not possible use some of the previous suggestions to help reduce the anxiety that accompanies stressful situations. MAKE SURE YOU:   Understand these instructions.  Will watch your condition.  Will get help right away if you are not doing well or get worse. Document Released: 06/01/2000 Document Revised: 08/27/2011 Document Reviewed: 07/02/2007 [facility] Patient Information 2014 [place]. Hypertension As your heart beats, it forces blood through your arteries. This force is your blood pressure. If the pressure is too high, it is called hypertension (HTN) or high blood pressure. HTN is dangerous because you may have it and not know it. High blood pressure may mean that your heart has to work harder to pump blood. Your arteries may be narrow or stiff. The extra work puts you at risk for heart disease, stroke, and other problems.  Blood pressure consists of two numbers, a higher number over a lower, 110/72, for example. It is stated as "110 over 72." The ideal is below 120 for the top number (systolic) and under 80 for the bottom (diastolic). Write down your blood pressure today. You should pay close attention to your blood pressure if you have certain conditions such as:  Heart failure.  Prior heart attack.  Diabetes  Chronic kidney disease.  Prior stroke.  Multiple risk factors for heart disease. To see if you have HTN, your blood pressure should be measured while you are seated with your arm held at the level of the heart. It should be measured at least twice. A one-time elevated blood pressure reading (especially in the Emergency Department) does not mean that  you need treatment. There may be conditions in which the blood pressure is different between your right and left arms. It is important to see your caregiver soon for a recheck. Most people have essential hypertension which means that there is not a specific cause. This type of high blood pressure may be lowered by changing lifestyle factors such as:  Stress.  Smoking.  Lack of exercise.  Excessive weight.  Drug/tobacco/alcohol use.  Eating less salt. Most people do not have symptoms from high blood pressure until it has caused damage to the body. Effective treatment can often prevent, delay or reduce that damage. TREATMENT  When a cause has been identified, treatment for high blood pressure is directed at the cause. There are a large number of medications to treat HTN. These fall into several categories, and your caregiver will help you select the medicines that are best for you. Medications may have side effects. You should review side effects with your caregiver. If your blood pressure stays high after you have made lifestyle changes or started on medicines,   Your medication(s) may need to be changed.  Other problems may need to be addressed.  Be certain you understand your prescriptions, and know how and when to take your medicine.  Be sure to follow up with your caregiver within the time frame advised (usually within two weeks)  to have your blood pressure rechecked and to review your medications.  If you are taking more than one medicine to lower your blood pressure, make sure you know how and at what times they should be taken. Taking two medicines at the same time can result in blood pressure that is too low. SEEK IMMEDIATE MEDICAL CARE IF:  You develop a severe headache, blurred or changing vision, or confusion.  You have unusual weakness or numbness, or a faint feeling.  You have severe chest or abdominal pain, vomiting, or breathing problems. MAKE SURE YOU:   Understand  these instructions.  Will watch your condition.  Will get help right away if you are not doing well or get worse. Document Released: 06/04/2005 Document Revised: 08/27/2011 Document Reviewed: 01/23/2008 [facility] Patient Information 2014 [place]. Aortic Stenosis Aortic stenosis is a narrowing of the aortic valve. The aortic valve is a gatelike structure that is located between the lower left chamber of the heart (left ventricle) and the blood vessel that leads away from the heart (aorta). When the aortic valve is narrowed, it does not open all the way. This makes it hard for the heart to pump blood into the aorta and causes the heart to work harder. The extra work can weaken the heart over time and lead to heart failure. CAUSES  Causes of aortic stenosis include:  Calcium deposits on the aortic valve that have made the valve stiff. This condition generally affects those over the age of 24. It is the most common cause of aortic stenosis.  A birth defect.  Rheumatic fever. This is a problem that may occur after a strep throat infection that was not treated adequately. Rheumatic fever can cause permanent damage to heart valves. SYMPTOMS  People with aortic stenosis usually have no symptoms until the condition becomes severe. It may take 10 20 years for mild or moderate aortic stenosis to become severe. Symptoms may include:   Shortness of breath, especially with physical activity.   Feeling weak and tired (fatigued) or getting tired easily.  Chest discomfort (angina). This may occur with minimal activity if the aortic stenosis is severe.  An irregular or faster-than-normal heartbeat.  Dizziness or fainting that happens with exertion or after taking certain heart medicines (such as nitroglycerin). DIAGNOSIS  Aortic stenosis is usually diagnosed with a physical exam and with a type of imaging test called echocardiography. During echocardiography, sound waves are used to evaluate  how blood flows through the heart. If your caregiver suspects aortic stenosis but the test does not clearly show it, a procedure called cardiac catheterization may be done to diagnose the condition. Tests may also be done to evaluate heart function. They may include:  Electrocardiography. During this test, the electrical impulses of the heart are recorded while you are lying down and sticky patches are placed on your chest, arms, and legs.  Stress tests. There is more than one type of stress test. If a stress test is needed, ask your caregiver about which type is best for you.  Blood tests. TREATMENT  Treatment depends on how severe the aortic stenosis is, your symptoms, and the problems it is causing.   Observation. If the aortic stenosis is mild, no treatment may be needed. However, you will need to have the condition checked regularly to make sure it is not getting worse or causing serious problems.  Surgery. Surgery to repair or replace the aortic valve is the most common treatment for aortic stenosis. Several types of  surgeries are available. The most common are open-heart surgery and transcutaneous aortic valve replacement (TAVR). TAVR does not require that the chest be opened. It is usually performed on elderly patients and those who are not able to have open-heart surgery.  Medicines. Medicines may be given to keep symptoms from getting worse. Medicines cannot reverse aortic stenosis. HOME CARE INSTRUCTIONS   You may need to avoid certain types of physical activity. If your aortic stenosis is mild, you may need to avoid only strenuous activity. The more severe your aortic stenosis, the more activities you will need to avoid. Talk with your caregiver about the types of activity you should avoid.  Take all medicines as directed by your caregiver.  If you are a woman with aortic valve stenosis and want to get pregnant, talk to your caregiver before you become pregnant.  If you are a woman  with aortic valve stenosis and are pregnant, keep all follow-up appointments with all recommended caregivers.  Keep all follow-up appointments for tests, exams, and treatments. SEEK IMMEDIATE MEDICAL CARE IF:  You develop chest pain or tightness.   You develop shortness of breath or difficulty breathing.   You develop lightheadedness or faint.   It feels like your heartbeat is irregular or faster than normal.  You have a fever or persistent symptoms for more than 2 3 days. Document Released: 03/03/2003 Document Revised: 09/29/2012 Document Reviewed: 05/29/2012 [facility] Patient Information 2014 [place], [place].

## 2013-11-02 NOTE — Assessment & Plan Note (Signed)
Ambien + elavil--have tried to wean elavil with poor results in the past.

## 2013-11-02 NOTE — Assessment & Plan Note (Signed)
BP is  Well controlled--check lipids and CMP

## 2013-11-02 NOTE — Assessment & Plan Note (Signed)
- 

## 2013-11-02 NOTE — Assessment & Plan Note (Addendum)
No need to f/u with cardiology unless symptoms arise

## 2013-11-02 NOTE — Progress Notes (Signed)
    Subjective:    Patient ID: Yvonne [name] is a 73 y.o. female presenting with Medication Refill and Follow-up  on 11/02/2013  HPI: Here for f/u.  Has no problems except needs meds refilled. Taking meds as indicated.  Has not had mammogram in some time.  Review of Systems  Constitutional: Negative for fever, chills, fatigue and unexpected weight change.  Respiratory: Negative for chest tightness and shortness of breath.   Cardiovascular: Negative for leg swelling.  Gastrointestinal: Negative for nausea, vomiting, abdominal pain, diarrhea and constipation.  Genitourinary: Negative for dysuria.      Objective:    BP 120/69  Pulse 80  Temp(Src) 98.6 F (37 C) (Oral)  Ht 5\' 2"  (1.575 m)  Wt 155 lb (70.308 kg)  BMI 28.34 kg/m2 Physical Exam  Vitals reviewed. Constitutional: She is oriented to person, place, and time. She appears well-developed and well-nourished.  Neck: Neck supple.  Cardiovascular: Normal rate and regular rhythm.   Murmur (2-3/6 SEM at LUSB) heard. Pulmonary/Chest: Effort normal.  Abdominal: Soft. There is no tenderness.  Neurological: She is alert and oriented to person, place, and time.  Skin: Skin is warm and dry. Rash (in scalp at base of skull, previous I and D is well healed.) noted.        Assessment & Plan:   HYPOTHYROIDISM, UNSPECIFIED Check TSH  HYPERTENSION BP is  Well controlled--check lipids and CMP  Aortic valve stenosis and insufficiency, rheumatic No need to f/u with cardiology unless symptoms arise  INSOMNIA, PERSISTENT Ambien + elavil--have tried to wean elavil with poor results in the past.  Hypercholesteremia Repeat lipids today  Schedule mammogram  Return in about 6 months (around 05/05/2014) for a follow-up.

## 2013-11-02 NOTE — Addendum Note (Signed)
Addended by: Christen Bame D on: 11/02/2013 03:10 PM   Modules accepted: Orders

## 2013-11-03 ENCOUNTER — Encounter: Payer: Self-pay | Admitting: Family Medicine

## 2013-11-03 LAB — LIPID PANEL
Cholesterol: 168 mg/dL (ref 0–200)
HDL: 44 mg/dL (ref >39)
LDL Cholesterol: 74 mg/dL (ref 0–99)
Total CHOL/HDL Ratio: 3.8 Ratio
Triglycerides: 251 mg/dL — ABNORMAL HIGH (ref <150)
VLDL: 50 mg/dL — ABNORMAL HIGH (ref 0–40)

## 2013-11-03 LAB — COMPREHENSIVE METABOLIC PANEL
ALK PHOS: 82 U/L (ref 39–117)
ALT: 11 U/L (ref 0–35)
AST: 13 U/L (ref 0–37)
Albumin: 4 g/dL (ref 3.5–5.2)
BILIRUBIN TOTAL: 0.2 mg/dL (ref 0.2–1.2)
BUN: 14 mg/dL (ref 6–23)
CO2: 27 mEq/L (ref 19–32)
Calcium: 9.3 mg/dL (ref 8.4–10.5)
Chloride: 95 mEq/L — ABNORMAL LOW (ref 96–112)
Creat: 0.68 mg/dL (ref 0.50–1.10)
Glucose, Bld: 124 mg/dL — ABNORMAL HIGH (ref 70–99)
Potassium: 4 mEq/L (ref 3.5–5.3)
SODIUM: 133 meq/L — AB (ref 135–145)
Total Protein: 7.1 g/dL (ref 6.0–8.3)

## 2013-11-03 LAB — TSH: TSH: 2.933 u[IU]/mL (ref 0.350–4.500)

## 2014-02-16 ENCOUNTER — Telehealth: Payer: Self-pay | Admitting: *Deleted

## 2014-02-16 DIAGNOSIS — H269 Unspecified cataract: Secondary | ICD-10-CM

## 2014-02-16 NOTE — Telephone Encounter (Signed)
Yvonne [name] called from Dr. Vickey Sages office and they are seeing patient for cataract evaluation.  They are needing a referral placed for [employer] purposes and we need to be able to document it.  Referral already done online for [facility] and it has been approved.  Thanks [employer]

## 2014-03-10 ENCOUNTER — Other Ambulatory Visit: Payer: Self-pay | Admitting: Family Medicine

## 2014-03-10 DIAGNOSIS — G4700 Insomnia, unspecified: Secondary | ICD-10-CM

## 2014-03-10 MED ORDER — ZOLPIDEM TARTRATE 5 MG PO TABS: 5.0000 mg | ORAL_TABLET | Freq: Every evening | ORAL | Status: DC | PRN

## 2014-04-07 ENCOUNTER — Ambulatory Visit (INDEPENDENT_AMBULATORY_CARE_PROVIDER_SITE_OTHER): Payer: Commercial Managed Care - HMO | Admitting: Family Medicine

## 2014-04-07 ENCOUNTER — Encounter: Payer: Self-pay | Admitting: Family Medicine

## 2014-04-07 VITALS — BP 122/70 | HR 88 | Temp 98.7°F | Ht 62.0 in | Wt 151.0 lb

## 2014-04-07 DIAGNOSIS — R52 Pain, unspecified: Secondary | ICD-10-CM

## 2014-04-07 MED ORDER — TRAMADOL HCL 50 MG PO TABS: 50.0000 mg | ORAL_TABLET | Freq: Three times a day (TID) | ORAL | Status: DC | PRN

## 2014-04-07 NOTE — Patient Instructions (Signed)
It was nice to see you today.  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    I have prescribed something short term for pain.    Please be sure to follow up closely with your PCP.

## 2014-04-07 NOTE — Assessment & Plan Note (Signed)
Patient unable to localize pain but does report it is worse in "both my legs". Crepitus noted on knee exam. No other focal findings today. Given HTN and age will not treat with NSAID's. Will treat pain with PRN tramadol. Patient to continue Elavil, which may also help potential neuropathic pain (patient's concern for fibromyalgia).

## 2014-04-07 NOTE — Progress Notes (Signed)
   Subjective:    Patient ID: Yvonne [name], female    DOB: 1941-01-12, 73 y.o.   MRN: [id number]  HPI 73 year old female with HTN, HLD, CVA, Hypothyroidism, OA, and anxiety presents for a same day appointment with complaints of "pain all over".  1) Pain  Patient reports that she has diffuse pain (all over).  She states that this began ~ 3-4 weeks ago.  Pain is more prominent in her LE's, particularly of her thighs and knees but is also "all over".   She reports that her pain is interfering with her ability to ambulate.  She is concerned that she has fibromyalgia and is requesting something for pain today.  No recent stressor, trauma, fall, injury.    No recent fever, chills, nausea, vomiting, or other systemic symptoms.  Review of Systems Per HPI    Objective:   Physical Exam Filed Vitals:   04/07/14 1049  BP: 122/70  Pulse: 88  Temp: 98.7 F (37.1 C)   Exam: General: well appearing elderly female in NAD.  Knee: Bilateral Normal to inspection with no erythema or effusion or obvious bony abnormalities. Palpation normal with no warmth, joint line tenderness, patellar tenderness, or condyle tenderness. ROM full in flexion and extension and lower leg rotation. Ligaments with solid consistent endpoints including ACL, PCL, LCL, MCL. Crepitus with ROM. Patellar and quadriceps tendons unremarkable. Hamstring and quadriceps strength is normal.  Neuro: No focal deficits  Psych: flat affect noted.    Assessment & Plan:  See Problem List

## 2014-04-27 ENCOUNTER — Encounter: Payer: Self-pay | Admitting: Family Medicine

## 2014-04-27 ENCOUNTER — Ambulatory Visit (INDEPENDENT_AMBULATORY_CARE_PROVIDER_SITE_OTHER): Payer: Commercial Managed Care - HMO | Admitting: Family Medicine

## 2014-04-27 VITALS — BP 124/76 | HR 87 | Temp 97.8°F | Ht 62.0 in | Wt 150.0 lb

## 2014-04-27 DIAGNOSIS — I062 Rheumatic aortic stenosis with insufficiency: Secondary | ICD-10-CM

## 2014-04-27 DIAGNOSIS — J069 Acute upper respiratory infection, unspecified: Secondary | ICD-10-CM

## 2014-04-27 DIAGNOSIS — I10 Essential (primary) hypertension: Secondary | ICD-10-CM

## 2014-04-27 MED ORDER — BENAZEPRIL-HYDROCHLOROTHIAZIDE 20-25 MG PO TABS: 1.0000 | ORAL_TABLET | Freq: Every day | ORAL | Status: DC

## 2014-04-27 MED ORDER — LEVOTHYROXINE SODIUM 75 MCG PO TABS: 75.0000 ug | ORAL_TABLET | Freq: Every day | ORAL | Status: DC

## 2014-04-27 MED ORDER — AMLODIPINE BESYLATE 5 MG PO TABS: 5.0000 mg | ORAL_TABLET | Freq: Every day | ORAL | Status: DC

## 2014-04-27 MED ORDER — ZOLPIDEM TARTRATE 5 MG PO TABS: 5.0000 mg | ORAL_TABLET | Freq: Every evening | ORAL | Status: DC | PRN

## 2014-04-27 NOTE — Patient Instructions (Signed)

## 2014-04-27 NOTE — Progress Notes (Signed)
    Subjective:    Patient ID: Yvonne [name] is a 73 y.o. female presenting with Influenza  on 04/27/2014  HPI: Reports initially felt bad, diffuse body aches.  Had cough but no fever.  Runny nose. Feels a little better.  Review of Systems  Constitutional: Negative for fever and chills.  Respiratory: Negative for shortness of breath.   Cardiovascular: Negative for chest pain.  Gastrointestinal: Negative for nausea, vomiting and abdominal pain.  Genitourinary: Negative for dysuria.  Skin: Negative for rash.      Objective:    BP 124/76 mmHg  Pulse 87  Temp(Src) 97.8 F (36.6 C) (Oral)  Ht 5\' 2"  (1.575 m)  Wt 150 lb (68.04 kg)  BMI 27.43 kg/m2 Physical Exam  Constitutional: She is oriented to person, place, and time. She appears well-developed and well-nourished. No distress.  HENT:  Head: Normocephalic and atraumatic.  Eyes: No scleral icterus.  Neck: Neck supple.  Cardiovascular: Normal rate.   Pulmonary/Chest: Effort normal.  Abdominal: Soft.  Neurological: She is alert and oriented to person, place, and time.  Skin: Skin is warm and dry.  Psychiatric: She has a normal mood and affect.        Assessment & Plan:   Problem List Items Addressed This Visit      Unprioritized   Essential hypertension - Primary    BP well controlled--med refill.    Relevant Medications      amLODIpine (NORVASC) tablet      levothyroxine (SYNTHROID, LEVOTHROID) tablet      benazepril-hydrochlorthiazide (LOTENSIN HCT) 20-25 MG per tablet   Aortic valve stenosis and insufficiency, rheumatic    Has not seen cards in 3 years.  Remains asymptomatic. Will see about further follow-up    Relevant Medications      amLODIpine (NORVASC) tablet      benazepril-hydrochlorthiazide (LOTENSIN HCT) 20-25 MG per tablet    Other Visit Diagnoses    Acute upper respiratory infection        Relevant Medications       AFLURIA PRESERVATIVE FREE 0.5 ML SUSY       Return in about 3 months  (around 07/28/2014).

## 2014-04-28 NOTE — Assessment & Plan Note (Signed)
Has not seen cards in 3 years.  Remains asymptomatic. Will see about further follow-up

## 2014-04-28 NOTE — Assessment & Plan Note (Signed)
BP well controlled--med refill.

## 2014-06-25 DIAGNOSIS — R928 Other abnormal and inconclusive findings on diagnostic imaging of breast: Secondary | ICD-10-CM | POA: Diagnosis not present

## 2014-06-25 DIAGNOSIS — R922 Inconclusive mammogram: Secondary | ICD-10-CM | POA: Diagnosis not present

## 2014-08-18 ENCOUNTER — Other Ambulatory Visit: Payer: Self-pay | Admitting: Family Medicine

## 2014-08-30 ENCOUNTER — Ambulatory Visit (INDEPENDENT_AMBULATORY_CARE_PROVIDER_SITE_OTHER): Payer: Commercial Managed Care - HMO | Admitting: Family Medicine

## 2014-08-30 ENCOUNTER — Encounter: Payer: Self-pay | Admitting: Family Medicine

## 2014-08-30 VITALS — Ht 62.0 in | Wt 153.0 lb

## 2014-08-30 DIAGNOSIS — Z23 Encounter for immunization: Secondary | ICD-10-CM | POA: Diagnosis not present

## 2014-08-30 DIAGNOSIS — I10 Essential (primary) hypertension: Secondary | ICD-10-CM | POA: Diagnosis not present

## 2014-08-30 DIAGNOSIS — Z0000 Encounter for general adult medical examination without abnormal findings: Secondary | ICD-10-CM | POA: Diagnosis not present

## 2014-08-30 DIAGNOSIS — M8580 Other specified disorders of bone density and structure, unspecified site: Secondary | ICD-10-CM | POA: Insufficient documentation

## 2014-08-30 DIAGNOSIS — G4700 Insomnia, unspecified: Secondary | ICD-10-CM

## 2014-08-30 DIAGNOSIS — F411 Generalized anxiety disorder: Secondary | ICD-10-CM | POA: Diagnosis not present

## 2014-08-30 MED ORDER — AMITRIPTYLINE HCL 50 MG PO TABS: 150.0000 mg | ORAL_TABLET | Freq: Every day | ORAL | Status: DC

## 2014-08-30 MED ORDER — ATORVASTATIN CALCIUM 10 MG PO TABS: 10.0000 mg | ORAL_TABLET | Freq: Every day | ORAL | Status: DC

## 2014-08-30 MED ORDER — ZOLPIDEM TARTRATE 5 MG PO TABS: 5.0000 mg | ORAL_TABLET | Freq: Every day | ORAL | Status: DC

## 2014-08-30 NOTE — Progress Notes (Signed)
    Subjective:    Patient ID: Yvonne [name] is a 74 y.o. female presenting with Annual Exam  on 08/30/2014  HPI: Here for annual exam.  Review of Systems  Constitutional: Negative for fever and chills.  HENT: Negative for congestion, nosebleeds and rhinorrhea.   Eyes: Negative for visual disturbance.  Respiratory: Negative for chest tightness and shortness of breath.   Cardiovascular: Negative for chest pain.  Gastrointestinal: Positive for nausea (occasional) and constipation. Negative for vomiting, abdominal pain, diarrhea, blood in stool and abdominal distention.  Genitourinary: Negative for dysuria, frequency, vaginal bleeding, vaginal discharge and menstrual problem.  Musculoskeletal: Positive for arthralgias (occasional - improved with aleve). Negative for gait problem.  Skin: Negative for rash.  Neurological: Negative for dizziness and headaches.  Psychiatric/Behavioral: Positive for sleep disturbance. Negative for behavioral problems and dysphoric mood.  All other systems reviewed and are negative.     Objective:    Ht 5\' 2"  (1.575 m)  Wt 153 lb (69.4 kg)  BMI 27.98 kg/m2 Physical Exam  Constitutional: She is oriented to person, place, and time. She appears well-developed and well-nourished. No distress.  HENT:  Head: Normocephalic and atraumatic.  Eyes: Pupils are equal, round, and reactive to light. No scleral icterus.  Neck: Normal range of motion. Neck supple. No thyromegaly present.  Cardiovascular: Normal rate, regular rhythm and intact distal pulses.   Murmur (2/6 systolic) heard. Pulmonary/Chest: Effort normal and breath sounds normal.  Abdominal: Soft. She exhibits no distension. There is no tenderness.  Musculoskeletal: She exhibits no edema.  Neurological: She is alert and oriented to person, place, and time.  Skin: Skin is warm and dry.  Psychiatric: She has a normal mood and affect.        Assessment & Plan:    Problem List Items Addressed This  Visit      Unprioritized   INSOMNIA, PERSISTENT   Relevant Medications   zolpidem (AMBIEN)  tablet   Essential hypertension    Medication refill--BP at goal      Relevant Medications   atorvastatin (LIPITOR) tablet   Osteopenia    Needs repeat DEXA - it has been 2 years since last.      Relevant Orders   DG DXA FRACTURE ASSESSMENT    Other Visit Diagnoses    Routine general medical examination at a health care facility    -  Primary    Update vaccines--needs Prevnar, lipids and labs at next visit--not quite 1 year since last update.    Anxiety state        Relevant Medications    amitriptyline (ELAVIL) tablet      Return in about 6 months (around 03/02/2015) for a follow-up.

## 2014-08-30 NOTE — Patient Instructions (Signed)
Preventive Care for Adults A healthy lifestyle and preventive care can promote health and wellness. Preventive health guidelines for women include the following key practices.  A routine yearly physical is a good way to check with your health care provider about your health and preventive screening. It is a chance to share any concerns and updates on your health and to receive a thorough exam.  Visit your dentist for a routine exam and preventive care every 6 months. Brush your teeth twice a day and floss once a day. Good oral hygiene prevents tooth decay and gum disease.  The frequency of eye exams is based on your age, health, family medical history, use of contact lenses, and other factors. Follow your health care provider's recommendations for frequency of eye exams.  Eat a healthy diet. Foods like vegetables, fruits, whole grains, low-fat dairy products, and lean protein foods contain the nutrients you need without too many calories. Decrease your intake of foods high in solid fats, added sugars, and salt. Eat the right amount of calories for you.Get information about a proper diet from your health care provider, if necessary.  Regular physical exercise is one of the most important things you can do for your health. Most adults should get at least 150 minutes of moderate-intensity exercise (any activity that increases your heart rate and causes you to sweat) each week. In addition, most adults need muscle-strengthening exercises on 2 or more days a week.  Maintain a healthy weight. The body mass index (BMI) is a screening tool to identify possible weight problems. It provides an estimate of body fat based on height and weight. Your health care provider can find your BMI and can help you achieve or maintain a healthy weight.For adults 20 years and older:  A BMI below 18.5 is considered underweight.  A BMI of 18.5 to 24.9 is normal.  A BMI of 25 to 29.9 is considered overweight.  A BMI of  30 and above is considered obese.  Maintain normal blood lipids and cholesterol levels by exercising and minimizing your intake of saturated fat. Eat a balanced diet with plenty of fruit and vegetables. Blood tests for lipids and cholesterol should begin at age 76 and be repeated every 5 years. If your lipid or cholesterol levels are high, you are over 50, or you are at high risk for heart disease, you may need your cholesterol levels checked more frequently.Ongoing high lipid and cholesterol levels should be treated with medicines if diet and exercise are not working.  If you smoke, find out from your health care provider how to quit. If you do not use tobacco, do not start.  Lung cancer screening is recommended for adults aged 22-80 years who are at high risk for developing lung cancer because of a history of smoking. A yearly low-dose CT scan of the lungs is recommended for people who have at least a 30-pack-year history of smoking and are a current smoker or have quit within the past 15 years. A pack year of smoking is smoking an average of 1 pack of cigarettes a day for 1 year (for example: 1 pack a day for 30 years or 2 packs a day for 15 years). Yearly screening should continue until the smoker has stopped smoking for at least 15 years. Yearly screening should be stopped for people who develop a health problem that would prevent them from having lung cancer treatment.  If you are pregnant, do not drink alcohol. If you are breastfeeding,  be very cautious about drinking alcohol. If you are not pregnant and choose to drink alcohol, do not have more than 1 drink per day. One drink is considered to be 12 ounces (355 mL) of beer, 5 ounces (148 mL) of wine, or 1.5 ounces (44 mL) of liquor.  Avoid use of street drugs. Do not share needles with anyone. Ask for help if you need support or instructions about stopping the use of drugs.  High blood pressure causes heart disease and increases the risk of  stroke. Your blood pressure should be checked at least every 1 to 2 years. Ongoing high blood pressure should be treated with medicines if weight loss and exercise do not work.  If you are 75-52 years old, ask your health care provider if you should take aspirin to prevent strokes.  Diabetes screening involves taking a blood sample to check your fasting blood sugar level. This should be done once every 3 years, after age 15, if you are within normal weight and without risk factors for diabetes. Testing should be considered at a younger age or be carried out more frequently if you are overweight and have at least 1 risk factor for diabetes.  Breast cancer screening is essential preventive care for women. You should practice "breast self-awareness." This means understanding the normal appearance and feel of your breasts and may include breast self-examination. Any changes detected, no matter how small, should be reported to a health care provider. Women in their 58s and 30s should have a clinical breast exam (CBE) by a health care provider as part of a regular health exam every 1 to 3 years. After age 16, women should have a CBE every year. Starting at age 53, women should consider having a mammogram (breast X-ray test) every year. Women who have a family history of breast cancer should talk to their health care provider about genetic screening. Women at a high risk of breast cancer should talk to their health care providers about having an MRI and a mammogram every year.  Breast cancer gene (BRCA)-related cancer risk assessment is recommended for women who have family members with BRCA-related cancers. BRCA-related cancers include breast, ovarian, tubal, and peritoneal cancers. Having family members with these cancers may be associated with an increased risk for harmful changes (mutations) in the breast cancer genes BRCA1 and BRCA2. Results of the assessment will determine the need for genetic counseling and  BRCA1 and BRCA2 testing.  Routine pelvic exams to screen for cancer are no longer recommended for nonpregnant women who are considered low risk for cancer of the pelvic organs (ovaries, uterus, and vagina) and who do not have symptoms. Ask your health care provider if a screening pelvic exam is right for you.  If you have had past treatment for cervical cancer or a condition that could lead to cancer, you need Pap tests and screening for cancer for at least 20 years after your treatment. If Pap tests have been discontinued, your risk factors (such as having a new sexual partner) need to be reassessed to determine if screening should be resumed. Some women have medical problems that increase the chance of getting cervical cancer. In these cases, your health care provider may recommend more frequent screening and Pap tests.  The HPV test is an additional test that may be used for cervical cancer screening. The HPV test looks for the virus that can cause the cell changes on the cervix. The cells collected during the Pap test can be  tested for HPV. The HPV test could be used to screen women aged 30 years and older, and should be used in women of any age who have unclear Pap test results. After the age of 30, women should have HPV testing at the same frequency as a Pap test.  Colorectal cancer can be detected and often prevented. Most routine colorectal cancer screening begins at the age of 50 years and continues through age 75 years. However, your health care provider may recommend screening at an earlier age if you have risk factors for colon cancer. On a yearly basis, your health care provider may provide home test kits to check for hidden blood in the stool. Use of a small camera at the end of a tube, to directly examine the colon (sigmoidoscopy or colonoscopy), can detect the earliest forms of colorectal cancer. Talk to your health care provider about this at age 50, when routine screening begins. Direct  exam of the colon should be repeated every 5-10 years through age 75 years, unless early forms of pre-cancerous polyps or small growths are found.  People who are at an increased risk for hepatitis B should be screened for this virus. You are considered at high risk for hepatitis B if:  You were born in a country where hepatitis B occurs often. Talk with your health care provider about which countries are considered high risk.  Your parents were born in a high-risk country and you have not received a shot to protect against hepatitis B (hepatitis B vaccine).  You have HIV or AIDS.  You use needles to inject street drugs.  You live with, or have sex with, someone who has hepatitis B.  You get hemodialysis treatment.  You take certain medicines for conditions like cancer, organ transplantation, and autoimmune conditions.  Hepatitis C blood testing is recommended for all people born from 1945 through 1965 and any individual with known risks for hepatitis C.  Practice safe sex. Use condoms and avoid high-risk sexual practices to reduce the spread of sexually transmitted infections (STIs). STIs include gonorrhea, chlamydia, syphilis, trichomonas, herpes, HPV, and human immunodeficiency virus (HIV). Herpes, HIV, and HPV are viral illnesses that have no cure. They can result in disability, cancer, and death.  You should be screened for sexually transmitted illnesses (STIs) including gonorrhea and chlamydia if:  You are sexually active and are younger than 24 years.  You are older than 24 years and your health care provider tells you that you are at risk for this type of infection.  Your sexual activity has changed since you were last screened and you are at an increased risk for chlamydia or gonorrhea. Ask your health care provider if you are at risk.  If you are at risk of being infected with HIV, it is recommended that you take a prescription medicine daily to prevent HIV infection. This is  called preexposure prophylaxis (PrEP). You are considered at risk if:  You are a heterosexual woman, are sexually active, and are at increased risk for HIV infection.  You take drugs by injection.  You are sexually active with a partner who has HIV.  Talk with your health care provider about whether you are at high risk of being infected with HIV. If you choose to begin PrEP, you should first be tested for HIV. You should then be tested every 3 months for as long as you are taking PrEP.  Osteoporosis is a disease in which the bones lose minerals and strength   with aging. This can result in serious bone fractures or breaks. The risk of osteoporosis can be identified using a bone density scan. Women ages 65 years and over and women at risk for fractures or osteoporosis should discuss screening with their health care providers. Ask your health care provider whether you should take a calcium supplement or vitamin D to reduce the rate of osteoporosis.  Menopause can be associated with physical symptoms and risks. Hormone replacement therapy is available to decrease symptoms and risks. You should talk to your health care provider about whether hormone replacement therapy is right for you.  Use sunscreen. Apply sunscreen liberally and repeatedly throughout the day. You should seek shade when your shadow is shorter than you. Protect yourself by wearing long sleeves, pants, a wide-brimmed hat, and sunglasses year round, whenever you are outdoors.  Once a month, do a whole body skin exam, using a mirror to look at the skin on your back. Tell your health care provider of new moles, moles that have irregular borders, moles that are larger than a pencil eraser, or moles that have changed in shape or color.  Stay current with required vaccines (immunizations).  Influenza vaccine. All adults should be immunized every year.  Tetanus, diphtheria, and acellular pertussis (Td, Tdap) vaccine. Pregnant women should  receive 1 dose of Tdap vaccine during each pregnancy. The dose should be obtained regardless of the length of time since the last dose. Immunization is preferred during the 27th-36th week of gestation. An adult who has not previously received Tdap or who does not know her vaccine status should receive 1 dose of Tdap. This initial dose should be followed by tetanus and diphtheria toxoids (Td) booster doses every 10 years. Adults with an unknown or incomplete history of completing a 3-dose immunization series with Td-containing vaccines should begin or complete a primary immunization series including a Tdap dose. Adults should receive a Td booster every 10 years.  Varicella vaccine. An adult without evidence of immunity to varicella should receive 2 doses or a second dose if she has previously received 1 dose. Pregnant females who do not have evidence of immunity should receive the first dose after pregnancy. This first dose should be obtained before leaving the health care facility. The second dose should be obtained 4-8 weeks after the first dose.  Human papillomavirus (HPV) vaccine. Females aged 13-26 years who have not received the vaccine previously should obtain the 3-dose series. The vaccine is not recommended for use in pregnant females. However, pregnancy testing is not needed before receiving a dose. If a female is found to be pregnant after receiving a dose, no treatment is needed. In that case, the remaining doses should be delayed until after the pregnancy. Immunization is recommended for any person with an immunocompromised condition through the age of 26 years if she did not get any or all doses earlier. During the 3-dose series, the second dose should be obtained 4-8 weeks after the first dose. The third dose should be obtained 24 weeks after the first dose and 16 weeks after the second dose.  Zoster vaccine. One dose is recommended for adults aged 60 years or older unless certain conditions are  present.  Measles, mumps, and rubella (MMR) vaccine. Adults born before 1957 generally are considered immune to measles and mumps. Adults born in 1957 or later should have 1 or more doses of MMR vaccine unless there is a contraindication to the vaccine or there is laboratory evidence of immunity to   each of the three diseases. A routine second dose of MMR vaccine should be obtained at least 28 days after the first dose for students attending postsecondary schools, health care workers, or international travelers. People who received inactivated measles vaccine or an unknown type of measles vaccine during 1963-1967 should receive 2 doses of MMR vaccine. People who received inactivated mumps vaccine or an unknown type of mumps vaccine before 1979 and are at high risk for mumps infection should consider immunization with 2 doses of MMR vaccine. For females of childbearing age, rubella immunity should be determined. If there is no evidence of immunity, females who are not pregnant should be vaccinated. If there is no evidence of immunity, females who are pregnant should delay immunization until after pregnancy. Unvaccinated health care workers born before 1957 who lack laboratory evidence of measles, mumps, or rubella immunity or laboratory confirmation of disease should consider measles and mumps immunization with 2 doses of MMR vaccine or rubella immunization with 1 dose of MMR vaccine.  Pneumococcal 13-valent conjugate (PCV13) vaccine. When indicated, a person who is uncertain of her immunization history and has no record of immunization should receive the PCV13 vaccine. An adult aged 19 years or older who has certain medical conditions and has not been previously immunized should receive 1 dose of PCV13 vaccine. This PCV13 should be followed with a dose of pneumococcal polysaccharide (PPSV23) vaccine. The PPSV23 vaccine dose should be obtained at least 8 weeks after the dose of PCV13 vaccine. An adult aged 19  years or older who has certain medical conditions and previously received 1 or more doses of PPSV23 vaccine should receive 1 dose of PCV13. The PCV13 vaccine dose should be obtained 1 or more years after the last PPSV23 vaccine dose.  Pneumococcal polysaccharide (PPSV23) vaccine. When PCV13 is also indicated, PCV13 should be obtained first. All adults aged 65 years and older should be immunized. An adult younger than age 65 years who has certain medical conditions should be immunized. Any person who resides in a nursing home or long-term care facility should be immunized. An adult smoker should be immunized. People with an immunocompromised condition and certain other conditions should receive both PCV13 and PPSV23 vaccines. People with human immunodeficiency virus (HIV) infection should be immunized as soon as possible after diagnosis. Immunization during chemotherapy or radiation therapy should be avoided. Routine use of PPSV23 vaccine is not recommended for American Indians, [place] Natives, or people younger than 65 years unless there are medical conditions that require PPSV23 vaccine. When indicated, people who have unknown immunization and have no record of immunization should receive PPSV23 vaccine. One-time revaccination 5 years after the first dose of PPSV23 is recommended for people aged 19-64 years who have chronic kidney failure, nephrotic syndrome, asplenia, or immunocompromised conditions. People who received 1-2 doses of PPSV23 before age 65 years should receive another dose of PPSV23 vaccine at age 65 years or later if at least 5 years have passed since the previous dose. Doses of PPSV23 are not needed for people immunized with PPSV23 at or after age 65 years.  Meningococcal vaccine. Adults with asplenia or persistent complement component deficiencies should receive 2 doses of quadrivalent meningococcal conjugate (MenACWY-D) vaccine. The doses should be obtained at least 2 months apart.  Microbiologists working with certain meningococcal bacteria, military recruits, people at risk during an outbreak, and people who travel to or live in countries with a high rate of meningitis should be immunized. A first-year college student up through age   21 years who is living in a residence hall should receive a dose if she did not receive a dose on or after her 16th birthday. Adults who have certain high-risk conditions should receive one or more doses of vaccine.  Hepatitis A vaccine. Adults who wish to be protected from this disease, have certain high-risk conditions, work with hepatitis A-infected animals, work in hepatitis A research labs, or travel to or work in countries with a high rate of hepatitis A should be immunized. Adults who were previously unvaccinated and who anticipate close contact with an international adoptee during the first 60 days after arrival in the [place] from a country with a high rate of hepatitis A should be immunized.  Hepatitis B vaccine. Adults who wish to be protected from this disease, have certain high-risk conditions, may be exposed to blood or other infectious body fluids, are household contacts or sex partners of hepatitis B positive people, are clients or workers in certain care facilities, or travel to or work in countries with a high rate of hepatitis B should be immunized.  Haemophilus influenzae type b (Hib) vaccine. A previously unvaccinated person with asplenia or sickle cell disease or having a scheduled splenectomy should receive 1 dose of Hib vaccine. Regardless of previous immunization, a recipient of a hematopoietic stem cell transplant should receive a 3-dose series 6-12 months after her successful transplant. Hib vaccine is not recommended for adults with HIV infection. Preventive Services / Frequency Ages 64 to 68 years  Blood pressure check.** / Every 1 to 2 years.  Lipid and cholesterol check.** / Every 5 years beginning at age  22.  Clinical breast exam.** / Every 3 years for women in their 88s and 53s.  BRCA-related cancer risk assessment.** / For women who have family members with a BRCA-related cancer (breast, ovarian, tubal, or peritoneal cancers).  Pap test.** / Every 2 years from ages 90 through 51. Every 3 years starting at age 21 through age 56 or 3 with a history of 3 consecutive normal Pap tests.  HPV screening.** / Every 3 years from ages 24 through ages 1 to 46 with a history of 3 consecutive normal Pap tests.  Hepatitis C blood test.** / For any individual with known risks for hepatitis C.  Skin self-exam. / Monthly.  Influenza vaccine. / Every year.  Tetanus, diphtheria, and acellular pertussis (Tdap, Td) vaccine.** / Consult your health care provider. Pregnant women should receive 1 dose of Tdap vaccine during each pregnancy. 1 dose of Td every 10 years.  Varicella vaccine.** / Consult your health care provider. Pregnant females who do not have evidence of immunity should receive the first dose after pregnancy.  HPV vaccine. / 3 doses over 6 months, if 72 and younger. The vaccine is not recommended for use in pregnant females. However, pregnancy testing is not needed before receiving a dose.  Measles, mumps, rubella (MMR) vaccine.** / You need at least 1 dose of MMR if you were born in 1957 or later. You may also need a 2nd dose. For females of childbearing age, rubella immunity should be determined. If there is no evidence of immunity, females who are not pregnant should be vaccinated. If there is no evidence of immunity, females who are pregnant should delay immunization until after pregnancy.  Pneumococcal 13-valent conjugate (PCV13) vaccine.** / Consult your health care provider.  Pneumococcal polysaccharide (PPSV23) vaccine.** / 1 to 2 doses if you smoke cigarettes or if you have certain conditions.  Meningococcal vaccine.** /  1 dose if you are age 19 to 21 years and a first-year college  student living in a residence hall, or have one of several medical conditions, you need to get vaccinated against meningococcal disease. You may also need additional booster doses.  Hepatitis A vaccine.** / Consult your health care provider.  Hepatitis B vaccine.** / Consult your health care provider.  Haemophilus influenzae type b (Hib) vaccine.** / Consult your health care provider. Ages 40 to 64 years  Blood pressure check.** / Every 1 to 2 years.  Lipid and cholesterol check.** / Every 5 years beginning at age 20 years.  Lung cancer screening. / Every year if you are aged 55-80 years and have a 30-pack-year history of smoking and currently smoke or have quit within the past 15 years. Yearly screening is stopped once you have quit smoking for at least 15 years or develop a health problem that would prevent you from having lung cancer treatment.  Clinical breast exam.** / Every year after age 40 years.  BRCA-related cancer risk assessment.** / For women who have family members with a BRCA-related cancer (breast, ovarian, tubal, or peritoneal cancers).  Mammogram.** / Every year beginning at age 40 years and continuing for as long as you are in good health. Consult with your health care provider.  Pap test.** / Every 3 years starting at age 30 years through age 65 or 70 years with a history of 3 consecutive normal Pap tests.  HPV screening.** / Every 3 years from ages 30 years through ages 65 to 70 years with a history of 3 consecutive normal Pap tests.  Fecal occult blood test (FOBT) of stool. / Every year beginning at age 50 years and continuing until age 75 years. You may not need to do this test if you get a colonoscopy every 10 years.  Flexible sigmoidoscopy or colonoscopy.** / Every 5 years for a flexible sigmoidoscopy or every 10 years for a colonoscopy beginning at age 50 years and continuing until age 75 years.  Hepatitis C blood test.** / For all people born from 1945 through  1965 and any individual with known risks for hepatitis C.  Skin self-exam. / Monthly.  Influenza vaccine. / Every year.  Tetanus, diphtheria, and acellular pertussis (Tdap/Td) vaccine.** / Consult your health care provider. Pregnant women should receive 1 dose of Tdap vaccine during each pregnancy. 1 dose of Td every 10 years.  Varicella vaccine.** / Consult your health care provider. Pregnant females who do not have evidence of immunity should receive the first dose after pregnancy.  Zoster vaccine.** / 1 dose for adults aged 60 years or older.  Measles, mumps, rubella (MMR) vaccine.** / You need at least 1 dose of MMR if you were born in 1957 or later. You may also need a 2nd dose. For females of childbearing age, rubella immunity should be determined. If there is no evidence of immunity, females who are not pregnant should be vaccinated. If there is no evidence of immunity, females who are pregnant should delay immunization until after pregnancy.  Pneumococcal 13-valent conjugate (PCV13) vaccine.** / Consult your health care provider.  Pneumococcal polysaccharide (PPSV23) vaccine.** / 1 to 2 doses if you smoke cigarettes or if you have certain conditions.  Meningococcal vaccine.** / Consult your health care provider.  Hepatitis A vaccine.** / Consult your health care provider.  Hepatitis B vaccine.** / Consult your health care provider.  Haemophilus influenzae type b (Hib) vaccine.** / Consult your health care provider. Ages 65   years and over  Blood pressure check.** / Every 1 to 2 years.  Lipid and cholesterol check.** / Every 5 years beginning at age 72 years.  Lung cancer screening. / Every year if you are aged 15-80 years and have a 30-pack-year history of smoking and currently smoke or have quit within the past 15 years. Yearly screening is stopped once you have quit smoking for at least 15 years or develop a health problem that would prevent you from having lung cancer  treatment.  Clinical breast exam.** / Every year after age 22 years.  BRCA-related cancer risk assessment.** / For women who have family members with a BRCA-related cancer (breast, ovarian, tubal, or peritoneal cancers).  Mammogram.** / Every year beginning at age 82 years and continuing for as long as you are in good health. Consult with your health care provider.  Pap test.** / Every 3 years starting at age 36 years through age 68 or 19 years with 3 consecutive normal Pap tests. Testing can be stopped between 65 and 70 years with 3 consecutive normal Pap tests and no abnormal Pap or HPV tests in the past 10 years.  HPV screening.** / Every 3 years from ages 64 years through ages 43 or 59 years with a history of 3 consecutive normal Pap tests. Testing can be stopped between 65 and 70 years with 3 consecutive normal Pap tests and no abnormal Pap or HPV tests in the past 10 years.  Fecal occult blood test (FOBT) of stool. / Every year beginning at age 1 years and continuing until age 53 years. You may not need to do this test if you get a colonoscopy every 10 years.  Flexible sigmoidoscopy or colonoscopy.** / Every 5 years for a flexible sigmoidoscopy or every 10 years for a colonoscopy beginning at age 70 years and continuing until age 87 years.  Hepatitis C blood test.** / For all people born from [birth year] through 1967 and any individual with known risks for hepatitis C.  Osteoporosis screening.** / A one-time screening for women ages 33 years and over and women at risk for fractures or osteoporosis.  Skin self-exam. / Monthly.  Influenza vaccine. / Every year.  Tetanus, diphtheria, and acellular pertussis (Tdap/Td) vaccine.** / 1 dose of Td every 10 years.  Varicella vaccine.** / Consult your health care provider.  Zoster vaccine.** / 1 dose for adults aged 14 years or older.  Pneumococcal 13-valent conjugate (PCV13) vaccine.** / Consult your health care provider.  Pneumococcal  polysaccharide (PPSV23) vaccine.** / 1 dose for all adults aged 8 years and older.  Meningococcal vaccine.** / Consult your health care provider.  Hepatitis A vaccine.** / Consult your health care provider.  Hepatitis B vaccine.** / Consult your health care provider.  Haemophilus influenzae type b (Hib) vaccine.** / Consult your health care provider. ** Family history and personal history of risk and conditions may change your health care provider's recommendations. Document Released: 07/31/2001 Document Revised: 10/19/2013 Document Reviewed: 10/30/2010 [facility] Patient Information 2015 [place], [place]. This information is not intended to replace advice given to you by your health care provider. Make sure you discuss any questions you have with your health care provider.

## 2014-08-30 NOTE — Assessment & Plan Note (Signed)
Needs repeat DEXA - it has been 2 years since last.

## 2014-08-30 NOTE — Assessment & Plan Note (Signed)
Medication refill--BP at goal

## 2014-09-13 ENCOUNTER — Ambulatory Visit (INDEPENDENT_AMBULATORY_CARE_PROVIDER_SITE_OTHER): Payer: Commercial Managed Care - HMO | Admitting: Family Medicine

## 2014-09-13 ENCOUNTER — Encounter: Payer: Self-pay | Admitting: Family Medicine

## 2014-09-13 VITALS — BP 129/65 | HR 79 | Temp 97.7°F | Ht 62.0 in | Wt 155.0 lb

## 2014-09-13 DIAGNOSIS — R229 Localized swelling, mass and lump, unspecified: Secondary | ICD-10-CM | POA: Diagnosis not present

## 2014-09-13 NOTE — Assessment & Plan Note (Signed)
Likely a small furuncle that does not appear to be fluctuant or amenable to I&D today. No fever, surrounding induration or erythema to suggest cellulitis. About 1 cm in diameter. Seems too superficial to be inflamed lymph node. -Warm compress 3 times a day 2 weeks discussed -Follow-up 1-2 weeks if no improvement.  -Reasons for immediate evaluation discussed with patient

## 2014-09-13 NOTE — Progress Notes (Signed)
Patient ID: Yvonne [name], female   DOB: September 07, 1940, 74 y.o.   MRN: [id number] Subjective:   CC: Bump in underarm  HPI:   Patient presents with about 2 days of a bump in her right axilla, unchanged over that time period. It has been red and hard but is not painful or warm. She denies drainage, fevers, chills, skin changes elsewhere, or other concerns. She wonders if it maybe started as a bug bite. She has been avoiding deodorant. She has not tried any medication or treatment. She has a history of an abscess on her neck that had to be I&D'ed in the past.   Review of Systems - Per HPI.   PMH - hypothyroidism, anxiety, insomnia, hypertension, history of CVA, aortic stenosis, gastroenteritis, hyperlipidemia, diffuse pain, osteopenia    Objective:  Physical Exam BP 129/65 mmHg  Pulse 79  Temp(Src) 97.7 F (36.5 C) (Oral)  Ht 5\' 2"  (1.575 m)  Wt 155 lb (70.308 kg)  BMI 28.34 kg/m2 GEN: NAD Skin: Right axilla with about 1 cm firm skin nodule that is nonfluctuant and nontender, not warm, but is erythematous. Nondraining and with no central punctum. No surrounding erythema or induration. Lymph: No axillary LAD     Assessment:     Yvonne [name] is a 74 y.o. female here for bump in right axilla.    Plan:     # See problem list and after visit summary for problem-specific plans.   # Health Maintenance: Not discussed. Here for wellness visit early this month.  Follow-up: Follow up in 1-2 weeks prn lack of improvement.   Hilton Sinclair, MD [place]

## 2014-09-13 NOTE — Patient Instructions (Signed)
This looks like a little inflamed hair follicle (small furuncle) in your underarm that is not quite yet an abscess but could develop into one. It is not amenable to be drained today. Use a warm washcloth to the area for 5-10 minutes 3 times daily. Come back in 1-2 weeks if this is not getting any better, or much sooner if you notice any increase in size, redness, pain, or you start developing any fevers or chills.  Abscess An abscess (boil or furuncle) is an infected area on or under the skin. This area is filled with yellowish-white fluid (pus) and other material (debris). HOME CARE   Only take medicines as told by your doctor.  If you were given antibiotic medicine, take it as directed. Finish the medicine even if you start to feel better.  If gauze is used, follow your doctor's directions for changing the gauze.  To avoid spreading the infection:  Keep your abscess covered with a bandage.  Wash your hands well.  Do not share personal care items, towels, or whirlpools with others.  Avoid skin contact with others.  Keep your skin and clothes clean around the abscess.  Keep all doctor visits as told. GET HELP RIGHT AWAY IF:   You have more pain, puffiness (swelling), or redness in the wound site.  You have more fluid or blood coming from the wound site.  You have muscle aches, chills, or you feel sick.  You have a fever. MAKE SURE YOU:   Understand these instructions.  Will watch your condition.  Will get help right away if you are not doing well or get worse. Document Released: 11/21/2007 Document Revised: 12/04/2011 Document Reviewed: 08/17/2011 [facility] Patient Information 2015 [place], [place]. This information is not intended to replace advice given to you by your health care provider. Make sure you discuss any questions you have with your health care provider.

## 2014-09-16 ENCOUNTER — Other Ambulatory Visit (HOSPITAL_COMMUNITY): Payer: Self-pay | Admitting: Cardiology

## 2014-09-16 DIAGNOSIS — I6523 Occlusion and stenosis of bilateral carotid arteries: Secondary | ICD-10-CM

## 2014-09-24 ENCOUNTER — Ambulatory Visit (HOSPITAL_COMMUNITY): Payer: Commercial Managed Care - HMO | Attending: Cardiovascular Disease | Admitting: *Deleted

## 2014-09-24 DIAGNOSIS — I6523 Occlusion and stenosis of bilateral carotid arteries: Secondary | ICD-10-CM | POA: Diagnosis not present

## 2014-09-24 NOTE — Progress Notes (Signed)
Carotid Duplex Exam Performed 

## 2014-10-06 ENCOUNTER — Telehealth: Payer: Self-pay | Admitting: Family Medicine

## 2014-10-06 MED ORDER — ACE KNEE BRACE LARGE MISC: Status: DC

## 2014-10-06 NOTE — Telephone Encounter (Signed)
Will place in provider's box once received.  She will be in clinic again on 10/12/2014. Javier Mamone,CMA

## 2014-10-06 NOTE — Telephone Encounter (Signed)
Needs an order sent for a knee brace sent to a company that she does not know the name of or fax number of but their number is [phone]. She says that if the order is sent then she can receive it for free. Thanks [employer], ASA

## 2014-10-06 NOTE — Telephone Encounter (Signed)
Verbal not allowed, calling to state that she sent the fax for the PCP to sign / thanks Fonda Kinder, ASA

## 2014-10-06 NOTE — Telephone Encounter (Signed)
LM for home care with verbal ok for brace.  Asked them to fax an order for pcp to sign if a verbal is not allowed. Franci Oshana,CMA

## 2014-10-06 NOTE — Telephone Encounter (Signed)
[facility] fax number [phone].  Request is through patient's [facility] plan.  Phineas Real is the representative I spoke with.  Johnney Ou

## 2014-10-14 ENCOUNTER — Telehealth: Payer: Self-pay | Admitting: Family Medicine

## 2014-10-14 ENCOUNTER — Ambulatory Visit: Payer: Commercial Managed Care - HMO

## 2014-10-14 NOTE — Telephone Encounter (Signed)
Is it possible to get this re-faxed? They received it but only the first page and half of the second page. She does not know why there was an error in receiving the fax. Thanks [employer], ASA

## 2014-10-14 NOTE — Telephone Encounter (Signed)
Notes refaxed. Jazmin Hartsell,CMA

## 2014-10-14 NOTE — Telephone Encounter (Signed)
[employer] will not cover the prescription that was sent and they apologize, but they are sending another fax with the insurance details, Mcarthur Rossetti should cover the knee brace model number: "[id number]"

## 2014-10-14 NOTE — Telephone Encounter (Signed)
Will forward to MD to see if she ever received the order via fax.  Bilaal Leib,CMA

## 2014-10-14 NOTE — Telephone Encounter (Signed)
[place]: Would like to followup about the RX order for knee braces. Checking on the status on RX for knee brace

## 2014-10-14 NOTE — Telephone Encounter (Signed)
Ok I will fax the note if you can just fax the order. Dierdra Salameh,CMA

## 2014-10-14 NOTE — Telephone Encounter (Signed)
Correct brace checked and faxed back to company. Johniya Durfee,CMA

## 2014-10-25 ENCOUNTER — Encounter: Payer: Self-pay | Admitting: Family Medicine

## 2014-10-26 DIAGNOSIS — M170 Bilateral primary osteoarthritis of knee: Secondary | ICD-10-CM | POA: Diagnosis not present

## 2014-11-25 ENCOUNTER — Telehealth: Payer: Self-pay | Admitting: *Deleted

## 2014-11-25 NOTE — Telephone Encounter (Signed)
Prior Authorization received from [place] for Zolpidem. Quantity limit of 90 tablets per 365 days.  Tried to call patient to ask how often she is taking the medication.  Pt's voice mail was full.  Please provide a rationale for the 90/90 quantity. PA will be completed online once nurse get a response back from patient or PCP.   Derl Barrow, RN

## 2014-12-06 ENCOUNTER — Encounter: Payer: Self-pay | Admitting: *Deleted

## 2014-12-08 ENCOUNTER — Ambulatory Visit (INDEPENDENT_AMBULATORY_CARE_PROVIDER_SITE_OTHER): Payer: Commercial Managed Care - HMO | Admitting: Cardiovascular Disease

## 2014-12-08 ENCOUNTER — Encounter: Payer: Self-pay | Admitting: Cardiovascular Disease

## 2014-12-08 VITALS — BP 142/72 | HR 82 | Ht 62.0 in | Wt 155.4 lb

## 2014-12-08 DIAGNOSIS — E7800 Pure hypercholesterolemia, unspecified: Secondary | ICD-10-CM

## 2014-12-08 DIAGNOSIS — E785 Hyperlipidemia, unspecified: Secondary | ICD-10-CM

## 2014-12-08 DIAGNOSIS — I062 Rheumatic aortic stenosis with insufficiency: Secondary | ICD-10-CM | POA: Diagnosis not present

## 2014-12-08 DIAGNOSIS — I10 Essential (primary) hypertension: Secondary | ICD-10-CM | POA: Diagnosis not present

## 2014-12-08 DIAGNOSIS — I6522 Occlusion and stenosis of left carotid artery: Secondary | ICD-10-CM

## 2014-12-08 LAB — BASIC METABOLIC PANEL
BUN: 26 mg/dL — ABNORMAL HIGH (ref 6–23)
CO2: 28 meq/L (ref 19–32)
Calcium: 9.5 mg/dL (ref 8.4–10.5)
Chloride: 101 mEq/L (ref 96–112)
Creatinine, Ser: 0.87 mg/dL (ref 0.40–1.20)
GFR: 67.37 mL/min (ref >60.00)
GLUCOSE: 117 mg/dL — AB (ref 70–99)
Potassium: 3.9 mEq/L (ref 3.5–5.1)
Sodium: 138 mEq/L (ref 135–145)

## 2014-12-08 LAB — HEPATIC FUNCTION PANEL
ALBUMIN: 4.1 g/dL (ref 3.5–5.2)
ALT: 14 U/L (ref 0–35)
AST: 15 U/L (ref 0–37)
Alkaline Phosphatase: 83 U/L (ref 39–117)
BILIRUBIN DIRECT: 0 mg/dL (ref 0.0–0.3)
BILIRUBIN TOTAL: 0.3 mg/dL (ref 0.2–1.2)
Total Protein: 7.8 g/dL (ref 6.0–8.3)

## 2014-12-08 LAB — LIPID PANEL
CHOLESTEROL: 169 mg/dL (ref 0–200)
HDL: 47.1 mg/dL (ref >39.00)
NONHDL: 121.9
Total CHOL/HDL Ratio: 4
Triglycerides: 220 mg/dL — ABNORMAL HIGH (ref 0.0–149.0)
VLDL: 44 mg/dL — ABNORMAL HIGH (ref 0.0–40.0)

## 2014-12-08 LAB — LDL CHOLESTEROL, DIRECT: Direct LDL: 89 mg/dL

## 2014-12-08 NOTE — Patient Instructions (Signed)
Medication Instructions:  Your physician recommends that you continue on your current medications as directed. Please refer to the Current Medication list given to you today.   Labwork: TODAY - cholesterol, liver, basic metabolic panel  Your physician recommends that you return for lab work in: 1 year on the day of or a few days before your office visit with Dr. Acie Fredrickson.  You will need to FAST for this appointment - nothing to eat or drink after midnight the night before except water.   Testing/Procedures: None Ordered   Follow-Up: Your physician wants you to follow-up in: 1 year with Dr. Acie Fredrickson.  You will receive a reminder letter in the mail two months in advance. If you don't receive a letter, please call our office to schedule the follow-up appointment.

## 2014-12-08 NOTE — Progress Notes (Signed)
Yvonne [name] Date of Birth  1940-09-17 [place] [address]    [place]   [place], [place]  [address]    [place],   [address] [phone]  Fax  [phone]  [phone]  Fax [phone]   Problem List: 1. Mild AS / AI  2. Hypothyroidism 3. Hypertension 4. Toe amputation for hammer toe 5. History of stroke- occluded left carotid, mild right carotid disease  History of Present Illness:  Pt is a 74 y.o. female with a hx of stroke at age 42.  She continues to have problems with her right arm.  She has had a heart murmur for years.  She has not had any recent problems.  She has a hx of HTN.  She admits to eating extra salt on occasion.  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 This corresponds with her previous stroke. There was no significant disease on right carotid. An echocardiogram revealed normal left ventricular systolic function.  She has mild aortic stenosis and aortic insufficiency which explains her heart murmur.  She eats lots of salty meals.  She eats prepared lunch and dinners every day.  She eats popcorn every night.    December 08, 2014:    Yvonne [name] is seen back after a 3 year absence.  Still eats some salt .  She is doing well.  She fell the other day.   misstepped on the cement.    Current Outpatient Prescriptions on File Prior to Visit  Medication Sig Dispense Refill  . AFLURIA PRESERVATIVE FREE 0.5 ML SUSY   0  . amitriptyline (ELAVIL) 50 MG tablet Take 3 tablets (150 mg total) by mouth at bedtime. 270 tablet 3  . amLODipine (NORVASC) 5 MG tablet Take 1 tablet (5 mg total) by mouth daily. 90 tablet 3  . aspirin 81 MG EC tablet Take 1 tablet (81 mg total) by mouth daily. 30 tablet 3  . atorvastatin (LIPITOR) 10 MG tablet Take 1 tablet (10 mg total) by mouth daily. 90 tablet 3  . benazepril-hydrochlorthiazide (LOTENSIN HCT) 20-25 MG per tablet  Take 1 tablet by mouth daily. 90 tablet 3  . cholecalciferol (VITAMIN D) 400 UNITS TABS Take 2.5 tablets (1,000 Units total) by mouth daily. 30 each 5  . Elastic Bandages & Supports (ACE KNEE BRACE LARGE) MISC Use as needed for knee support 1 each 0  . fish oil-omega-3 fatty acids 1000 MG capsule Take 1,000 mg by mouth daily.    Marland Kitchen levothyroxine (SYNTHROID, LEVOTHROID) 75 MCG tablet Take 1 tablet (75 mcg total) by mouth daily. 90 tablet 3  . Multiple Vitamin (MULTIVITAMIN) tablet Take 1 tablet by mouth daily.      . Wheat Dextrin (EQ FIBER POWDER) POWD Take 1 Container by mouth daily. 477 g 5   No current facility-administered medications on file prior to visit.    No Known Allergies  Past Medical History  Diagnosis Date  . Allergy   . Hypertension   . Thyroid disease   . Stroke     pt was 64    Past Surgical History  Procedure Laterality Date  . Abdominal hysterectomy    . Brain surgery    . Toe amputation  2013    2nd toe on right foot, hammer toe    History  Smoking status  . Never Smoker   Smokeless tobacco  .  Never Used    History  Alcohol Use No    Family History  Problem Relation Age of Onset  . Thyroid disease Mother   . Heart disease Father     Reviw of Systems:  Reviewed in the HPI.  All other systems are negative.  Physical Exam: Blood pressure 142/72, pulse 82, height 5\' 2"  (1.575 m), weight 70.489 kg (155 lb 6.4 oz). General: Well developed, well nourished, in no acute distress.  Head: Normocephalic, atraumatic, sclera non-icteric, mucus membranes are moist,   Neck: Supple.  JVD not elevated.  Lungs: Clear bilaterally to auscultation without wheezes, rales, or rhonchi. Breathing is unlabored.  Heart: RRR with S1 S2. There is a 2/6 systolic murmur at the left sternal border.  Abdomen: Soft, non-tender, non-distended with normoactive bowel sounds. No hepatomegaly. No rebound/guarding. No obvious abdominal masses.  Msk:  Strength and tone  appear normal for age.  Extremities: No clubbing or cyanosis. No edema.  Distal pedal pulses are 2+ and equal bilaterally.  Neuro: Alert and oriented X 3. Moves all extremities spontaneously.  Psych:  Responds to questions appropriately with a normal affect.  ECG: December 08, 2014:  NSR at 24.  RBBB, LAHB.  Assessment / Plan:   1. Mild AS / AI  - benign.  No symptoms .     2. Hypothyroidism - followed by family practice.   3. Hypertension - BP is fairly well controlled.  Advised her to avoid salt  4. Toe amputation for hammer toe  5. 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  Suggested repeat study in 2 years     Mykael Trott, Wonda Cheng, MD  12/08/2014 10:08 AM    [address],  [place], [place]  [address] Pager [phone] Phone: [phone]; Fax: [phone]   [facility]  [address] [place], [place]  [address] [phone]    Fax [phone]

## 2014-12-31 ENCOUNTER — Encounter: Payer: Self-pay | Admitting: Family Medicine

## 2014-12-31 ENCOUNTER — Ambulatory Visit (INDEPENDENT_AMBULATORY_CARE_PROVIDER_SITE_OTHER): Payer: Commercial Managed Care - HMO | Admitting: Family Medicine

## 2014-12-31 VITALS — BP 137/64 | HR 87 | Temp 97.9°F | Ht 62.0 in | Wt 153.5 lb

## 2014-12-31 DIAGNOSIS — M8580 Other specified disorders of bone density and structure, unspecified site: Secondary | ICD-10-CM

## 2014-12-31 DIAGNOSIS — G4700 Insomnia, unspecified: Secondary | ICD-10-CM

## 2014-12-31 DIAGNOSIS — I062 Rheumatic aortic stenosis with insufficiency: Secondary | ICD-10-CM

## 2014-12-31 DIAGNOSIS — F411 Generalized anxiety disorder: Secondary | ICD-10-CM | POA: Diagnosis not present

## 2014-12-31 DIAGNOSIS — E7800 Pure hypercholesterolemia, unspecified: Secondary | ICD-10-CM

## 2014-12-31 DIAGNOSIS — E038 Other specified hypothyroidism: Secondary | ICD-10-CM

## 2014-12-31 DIAGNOSIS — I10 Essential (primary) hypertension: Secondary | ICD-10-CM | POA: Diagnosis not present

## 2014-12-31 MED ORDER — AMITRIPTYLINE HCL 50 MG PO TABS: 150.0000 mg | ORAL_TABLET | Freq: Every day | ORAL | Status: DC

## 2014-12-31 MED ORDER — ATORVASTATIN CALCIUM 10 MG PO TABS: 10.0000 mg | ORAL_TABLET | Freq: Every day | ORAL | Status: DC

## 2014-12-31 MED ORDER — LEVOTHYROXINE SODIUM 75 MCG PO TABS: 75.0000 ug | ORAL_TABLET | Freq: Every day | ORAL | Status: DC

## 2014-12-31 MED ORDER — ZALEPLON 5 MG PO CAPS: 5.0000 mg | ORAL_CAPSULE | Freq: Every evening | ORAL | Status: DC | PRN

## 2014-12-31 MED ORDER — BENAZEPRIL-HYDROCHLOROTHIAZIDE 20-25 MG PO TABS: 1.0000 | ORAL_TABLET | Freq: Every day | ORAL | Status: DC

## 2014-12-31 MED ORDER — AMLODIPINE BESYLATE 5 MG PO TABS: 5.0000 mg | ORAL_TABLET | Freq: Every day | ORAL | Status: DC

## 2014-12-31 NOTE — Progress Notes (Signed)
    Subjective:    Patient ID: Yvonne [name] is a 74 y.o. female presenting with Follow-up  on 12/31/2014  HPI: Saw cards--heart is stable She is having knee pain x 6 months.  She started Omega XL 1 month ago and reports no more pain. Has been > 1 year since last thyroid check. Last mammogram was done 1/16  Review of Systems  Unable to perform ROS Constitutional: Negative for fever and chills.  Respiratory: Negative for shortness of breath.   Cardiovascular: Negative for chest pain.  Gastrointestinal: Negative for nausea, vomiting and abdominal pain.  Genitourinary: Negative for dysuria.  Skin: Negative for rash.      Objective:    BP 137/64 mmHg  Pulse 87  Temp(Src) 97.9 F (36.6 C) (Oral)  Ht 5\' 2"  (1.575 m)  Wt 153 lb 8 oz (69.627 kg)  BMI 28.07 kg/m2 Physical Exam  Constitutional: She is oriented to person, place, and time. She appears well-developed and well-nourished. No distress.  HENT:  Head: Normocephalic and atraumatic.  Eyes: No scleral icterus.  Neck: Neck supple.  Cardiovascular: Normal rate.   Murmur (2-3/6) heard. Pulmonary/Chest: Effort normal.  Abdominal: Soft.  Neurological: She is alert and oriented to person, place, and time.  Skin: Skin is warm and dry.  Psychiatric: She has a normal mood and affect.        Assessment & Plan:   Problem List Items Addressed This Visit      Unprioritized   Hypothyroidism    Med refilled--check TSH today      Relevant Medications   levothyroxine (SYNTHROID, LEVOTHROID) 75 MCG tablet   Other Relevant Orders   TSH   INSOMNIA, PERSISTENT    Cannot get her Ambien--still struggling to sleep--trial of another sleep aid.      Relevant Medications   zaleplon (SONATA) 5 MG capsule   Essential hypertension - Primary    BP at goal--medication refilled      Relevant Medications   benazepril-hydrochlorthiazide (LOTENSIN HCT) 20-25 MG per tablet   amLODipine (NORVASC) 5 MG tablet   atorvastatin (LIPITOR)  10 MG tablet   levothyroxine (SYNTHROID, LEVOTHROID) 75 MCG tablet   Aortic valve stenosis and insufficiency, rheumatic    Stable per cards--no need for more f/u if no symptoms.      Relevant Medications   benazepril-hydrochlorthiazide (LOTENSIN HCT) 20-25 MG per tablet   amLODipine (NORVASC) 5 MG tablet   atorvastatin (LIPITOR) 10 MG tablet   Hypercholesteremia    Diet to help with triglycerides--continue lipitor      Relevant Medications   benazepril-hydrochlorthiazide (LOTENSIN HCT) 20-25 MG per tablet   amLODipine (NORVASC) 5 MG tablet   atorvastatin (LIPITOR) 10 MG tablet   Osteopenia    Patient to schedule DEXA       Other Visit Diagnoses    Anxiety state        Relevant Medications    amitriptyline (ELAVIL) 50 MG tablet         Jenniffer Vessels S 12/31/2014 11:36 AM

## 2014-12-31 NOTE — Patient Instructions (Signed)

## 2014-12-31 NOTE — Assessment & Plan Note (Signed)
BP at goal--medication refilled

## 2014-12-31 NOTE — Assessment & Plan Note (Signed)
Med refilled--check TSH today

## 2014-12-31 NOTE — Assessment & Plan Note (Signed)
Patient to schedule DEXA

## 2014-12-31 NOTE — Assessment & Plan Note (Signed)
Diet to help with triglycerides--continue lipitor

## 2014-12-31 NOTE — Assessment & Plan Note (Signed)
Stable per cards--no need for more f/u if no symptoms.

## 2014-12-31 NOTE — Assessment & Plan Note (Signed)
Cannot get her Ambien--still struggling to sleep--trial of another sleep aid.

## 2015-01-01 LAB — TSH: TSH: 2.268 u[IU]/mL (ref 0.350–4.500)

## 2015-01-04 ENCOUNTER — Encounter: Payer: Self-pay | Admitting: *Deleted

## 2015-01-04 ENCOUNTER — Telehealth: Payer: Self-pay | Admitting: *Deleted

## 2015-01-04 NOTE — Telephone Encounter (Signed)
Letter mailed to patient. Jazmin Hartsell,CMA  

## 2015-01-04 NOTE — Telephone Encounter (Signed)
-----   Message from Truett Mainland, DO sent at 01/03/2015  6:43 PM EDT ----- Yvonne [name] to call pt with result - mailbox full.  TSH normal.  Please let pt know.

## 2015-01-06 ENCOUNTER — Ambulatory Visit
Admission: RE | Admit: 2015-01-06 | Discharge: 2015-01-06 | Disposition: A | Discharge status: Home / Self Care | Payer: Commercial Managed Care - HMO | Source: Ambulatory Visit | Attending: Family Medicine | Admitting: Family Medicine

## 2015-01-06 DIAGNOSIS — M8580 Other specified disorders of bone density and structure, unspecified site: Secondary | ICD-10-CM

## 2015-01-06 DIAGNOSIS — Z13820 Encounter for screening for osteoporosis: Secondary | ICD-10-CM | POA: Diagnosis not present

## 2015-01-06 DIAGNOSIS — Z780 Asymptomatic menopausal state: Secondary | ICD-10-CM | POA: Diagnosis not present

## 2015-01-07 ENCOUNTER — Telehealth: Payer: Self-pay | Admitting: *Deleted

## 2015-01-07 ENCOUNTER — Encounter: Payer: Self-pay | Admitting: *Deleted

## 2015-01-07 NOTE — Telephone Encounter (Signed)
Letter mailed to patient. Jazmin Hartsell,CMA  

## 2015-01-07 NOTE — Telephone Encounter (Signed)
-----   Message from Truett Mainland, DO sent at 01/07/2015  3:22 PM EDT ----- DEXA scan normal

## 2015-01-31 ENCOUNTER — Encounter: Payer: Self-pay | Admitting: Gastroenterology

## 2015-03-17 ENCOUNTER — Encounter: Payer: Self-pay | Admitting: Family Medicine

## 2015-03-17 ENCOUNTER — Ambulatory Visit (INDEPENDENT_AMBULATORY_CARE_PROVIDER_SITE_OTHER): Payer: Commercial Managed Care - HMO | Admitting: Family Medicine

## 2015-03-17 VITALS — BP 130/61 | HR 80 | Temp 98.0°F | Ht 62.0 in | Wt 152.0 lb

## 2015-03-17 DIAGNOSIS — I10 Essential (primary) hypertension: Secondary | ICD-10-CM | POA: Diagnosis not present

## 2015-03-17 DIAGNOSIS — M1712 Unilateral primary osteoarthritis, left knee: Secondary | ICD-10-CM

## 2015-03-17 DIAGNOSIS — G4700 Insomnia, unspecified: Secondary | ICD-10-CM | POA: Diagnosis not present

## 2015-03-17 MED ORDER — ZALEPLON 5 MG PO CAPS: 5.0000 mg | ORAL_CAPSULE | Freq: Every evening | ORAL | Status: DC | PRN

## 2015-03-17 NOTE — Progress Notes (Signed)
    Subjective:    Patient ID: Yvonne [name] is a 74 y.o. female presenting with Follow-up  on 03/17/2015  HPI: Here for f/u. BP is well controlled.  TSH was normal last visit.' Reports knee pain which has been ongoing.  Taking some Omega XL which is costing her $50/month and she wants something cheaper or paid for through Medicare. It really helps with her knee pain.   Review of Systems  Constitutional: Negative for fever and chills.  Respiratory: Negative for shortness of breath.   Cardiovascular: Negative for chest pain.  Gastrointestinal: Negative for nausea, vomiting and abdominal pain.  Genitourinary: Negative for dysuria.  Musculoskeletal: Positive for arthralgias (left knee).  Skin: Negative for rash.      Objective:    BP 130/61 mmHg  Pulse 80  Temp(Src) 98 F (36.7 C) (Oral)  Ht 5\' 2"  (1.575 m)  Wt 152 lb (68.947 kg)  BMI 27.79 kg/m2 Physical Exam  Constitutional: She is oriented to person, place, and time. She appears well-developed and well-nourished. No distress.  HENT:  Head: Normocephalic and atraumatic.  Eyes: No scleral icterus.  Neck: Neck supple.  Cardiovascular: Normal rate.   Murmur (2/6 SEM) heard. Pulmonary/Chest: Effort normal.  Abdominal: Soft.  Neurological: She is alert and oriented to person, place, and time.  Skin: Skin is warm and dry.  Psychiatric: She has a normal mood and affect.        Assessment & Plan:   Problem List Items Addressed This Visit      Unprioritized   INSOMNIA, PERSISTENT - Primary   Relevant Medications   zaleplon (SONATA) 5 MG capsule   Essential hypertension    Well controlled.      Osteoarthritis    Left knee--replacement is unavailable--I looked up all ingredients--she will stay on this for now.      Relevant Medications   naproxen sodium (ANAPROX) 220 MG tablet      Total face-to-face time with patient: 15 minutes. Over 50% of encounter was spent on counseling and coordination of  care.  Vash Quezada S 03/17/2015 2:06 PM

## 2015-03-17 NOTE — Assessment & Plan Note (Signed)
Well controlled 

## 2015-03-17 NOTE — Assessment & Plan Note (Signed)
Left knee--replacement is unavailable--I looked up all ingredients--she will stay on this for now.

## 2015-03-17 NOTE — Patient Instructions (Signed)
Food Choices to Lower Your Triglycerides  Triglycerides are a type of fat in your blood. High levels of triglycerides can increase the risk of heart disease and stroke. If your triglyceride levels are high, the foods you eat and your eating habits are very important. Choosing the right foods can help lower your triglycerides.  WHAT GENERAL GUIDELINES DO I NEED TO FOLLOW?  Lose weight if you are overweight.   Limit or avoid alcohol.   Fill one half of your plate with vegetables and green salads.   Limit fruit to two servings a day. Choose fruit instead of juice.   Make one fourth of your plate whole grains. Look for the word "whole" as the first word in the ingredient list.  Fill one fourth of your plate with lean protein foods.  Enjoy fatty fish (such as salmon, mackerel, sardines, and tuna) three times a week.   Choose healthy fats.   Limit foods high in starch and sugar.  Eat more home-cooked food and less restaurant, buffet, and fast food.  Limit fried foods.  Cook foods using methods other than frying.  Limit saturated fats.  Check ingredient lists to avoid foods with partially hydrogenated oils (trans fats) in them. WHAT FOODS CAN I EAT?  Grains Whole grains, such as whole wheat or whole grain breads, crackers, cereals, and pasta. Unsweetened oatmeal, bulgur, barley, quinoa, or brown rice. Corn or whole wheat flour tortillas.  Vegetables Fresh or frozen vegetables (raw, steamed, roasted, or grilled). Green salads. Fruits All fresh, canned (in natural juice), or frozen fruits. Meat and Other Protein Products Ground beef (85% or leaner), grass-fed beef, or beef trimmed of fat. Skinless chicken or turkey. Ground chicken or turkey. Pork trimmed of fat. All fish and seafood. Eggs. Dried beans, peas, or lentils. Unsalted nuts or seeds. Unsalted canned or dry beans. Dairy Low-fat dairy products, such as skim or 1% milk, 2% or reduced-fat cheeses, low-fat ricotta or  cottage cheese, or plain low-fat yogurt. Fats and Oils Tub margarines without trans fats. Light or reduced-fat mayonnaise and salad dressings. Avocado. Safflower, olive, or canola oils. Natural peanut or almond butter. The items listed above may not be a complete list of recommended foods or beverages. Contact your dietitian for more options. WHAT FOODS ARE NOT RECOMMENDED?  Grains White bread. White pasta. White rice. Cornbread. Bagels, pastries, and croissants. Crackers that contain trans fat. Vegetables White potatoes. Corn. Creamed or fried vegetables. Vegetables in a cheese sauce. Fruits Dried fruits. Canned fruit in light or heavy syrup. Fruit juice. Meat and Other Protein Products Fatty cuts of meat. Ribs, chicken wings, bacon, sausage, bologna, salami, chitterlings, fatback, hot dogs, bratwurst, and packaged luncheon meats. Dairy Whole or 2% milk, cream, half-and-half, and cream cheese. Whole-fat or sweetened yogurt. Full-fat cheeses. Nondairy creamers and whipped toppings. Processed cheese, cheese spreads, or cheese curds. Sweets and Desserts Corn syrup, sugars, honey, and molasses. Candy. Jam and jelly. Syrup. Sweetened cereals. Cookies, pies, cakes, donuts, muffins, and ice cream. Fats and Oils Butter, stick margarine, lard, shortening, ghee, or bacon fat. Coconut, palm kernel, or palm oils. Beverages Alcohol. Sweetened drinks (such as sodas, lemonade, and fruit drinks or punches). The items listed above may not be a complete list of foods and beverages to avoid. Contact your dietitian for more information. Document Released: 03/22/2004 Document Revised: 06/09/2013 Document Reviewed: 04/08/2013 [employer] Patient Information 2015 [employer]. This information is not intended to replace advice given to you by your health care Yvonne [name]. Make sure you discuss   any questions you have with your health care Yvonne [name]. Insomnia Insomnia is frequent trouble falling and/or staying asleep.  Insomnia can be a long term problem or a short term problem. Both are common. Insomnia can be a short term problem when the wakefulness is related to a certain stress or worry. Long term insomnia is often related to ongoing stress during waking hours and/or poor sleeping habits. Overtime, sleep deprivation itself can make the problem worse. Every little thing feels more severe because you are overtired and your ability to cope is decreased. CAUSES   Stress, anxiety, and depression.  Poor sleeping habits.  Distractions such as TV in the bedroom.  Naps close to bedtime.  Engaging in emotionally charged conversations before bed.  Technical reading before sleep.  Alcohol and other sedatives. They may make the problem worse. They can hurt normal sleep patterns and normal dream activity.  Stimulants such as caffeine for several hours prior to bedtime.  Pain syndromes and shortness of breath can cause insomnia.  Exercise late at night.  Changing time zones may cause sleeping problems (jet lag). It is sometimes helpful to have someone observe your sleeping patterns. They should look for periods of not breathing during the night (sleep apnea). They should also look to see how long those periods last. If you live alone or observers are uncertain, you can also be observed at a sleep clinic where your sleep patterns will be professionally monitored. Sleep apnea requires a checkup and treatment. Give your caregivers your medical history. Give your caregivers observations your family has made about your sleep.  SYMPTOMS   Not feeling rested in the morning.  Anxiety and restlessness at bedtime.  Difficulty falling and staying asleep. TREATMENT   Your caregiver may prescribe treatment for an underlying medical disorders. Your caregiver can give advice or help if you are using alcohol or other drugs for self-medication. Treatment of underlying problems will usually eliminate insomnia  problems.  Medications can be prescribed for short time use. They are generally not recommended for lengthy use.  Over-the-counter sleep medicines are not recommended for lengthy use. They can be habit forming.  You can promote easier sleeping by making lifestyle changes such as:  Using relaxation techniques that help with breathing and reduce muscle tension.  Exercising earlier in the day.  Changing your diet and the time of your last meal. No night time snacks.  Establish a regular time to go to bed.  Counseling can help with stressful problems and worry.  Soothing music and white noise may be helpful if there are background noises you cannot remove.  Stop tedious detailed work at least one hour before bedtime. HOME CARE INSTRUCTIONS   Keep a diary. Inform your caregiver about your progress. This includes any medication side effects. See your caregiver regularly. Take note of:  Times when you are asleep.  Times when you are awake during the night.  The quality of your sleep.  How you feel the next day. This information will help your caregiver care for you.  Get out of bed if you are still awake after 15 minutes. Read or do some quiet activity. Keep the lights down. Wait until you feel sleepy and go back to bed.  Keep regular sleeping and waking hours. Avoid naps.  Exercise regularly.  Avoid distractions at bedtime. Distractions include watching television or engaging in any intense or detailed activity like attempting to balance the household checkbook.  Develop a bedtime ritual. Keep a familiar routine of  bathing, brushing your teeth, climbing into bed at the same time each night, listening to soothing music. Routines increase the success of falling to sleep faster.  Use relaxation techniques. This can be using breathing and muscle tension release routines. It can also include visualizing peaceful scenes. You can also help control troubling or intruding thoughts by  keeping your mind occupied with boring or repetitive thoughts like the old concept of counting sheep. You can make it more creative like imagining planting one beautiful flower after another in your backyard garden.  During your day, work to eliminate stress. When this is not possible use some of the previous suggestions to help reduce the anxiety that accompanies stressful situations. MAKE SURE YOU:   Understand these instructions.  Will watch your condition.  Will get help right away if you are not doing well or get worse. Document Released: 06/01/2000 Document Revised: 08/27/2011 Document Reviewed: 07/02/2007 [facility] Patient Information 2015 [place], [place]. This information is not intended to replace advice given to you by your health care Yvonne [name]. Make sure you discuss any questions you have with your health care Yvonne [name].

## 2015-04-12 ENCOUNTER — Telehealth: Payer: Self-pay | Admitting: *Deleted

## 2015-04-12 NOTE — Telephone Encounter (Signed)
Prior Authorization received from [place] for [employer]. PA was complete online at [email or web address] 04/11/15.  PA pending; review process could take 24-72 hours to complete.  Derl Barrow, RN

## 2015-04-14 NOTE — Telephone Encounter (Signed)
PA was approved via [employer] until 04/11/16. PA case number [id number].  Patient has tried an failed Ambien 12.5 11/21-07/25/11; 10 mg 08/30/10-02/05/11 and 5 mg 02/05/11-05/09/11; 07/25/11-present.  Derl Barrow, RN

## 2015-06-30 DIAGNOSIS — Z961 Presence of intraocular lens: Secondary | ICD-10-CM | POA: Diagnosis not present

## 2015-06-30 DIAGNOSIS — Z9889 Other specified postprocedural states: Secondary | ICD-10-CM | POA: Diagnosis not present

## 2015-06-30 DIAGNOSIS — H50111 Monocular exotropia, right eye: Secondary | ICD-10-CM | POA: Diagnosis not present

## 2015-06-30 DIAGNOSIS — H04123 Dry eye syndrome of bilateral lacrimal glands: Secondary | ICD-10-CM | POA: Diagnosis not present

## 2015-06-30 DIAGNOSIS — H2511 Age-related nuclear cataract, right eye: Secondary | ICD-10-CM | POA: Diagnosis not present

## 2015-06-30 DIAGNOSIS — H26492 Other secondary cataract, left eye: Secondary | ICD-10-CM | POA: Diagnosis not present

## 2015-07-11 ENCOUNTER — Other Ambulatory Visit: Payer: Self-pay | Admitting: *Deleted

## 2015-07-11 DIAGNOSIS — G4700 Insomnia, unspecified: Secondary | ICD-10-CM

## 2015-07-11 MED ORDER — ZALEPLON 5 MG PO CAPS: 5.0000 mg | ORAL_CAPSULE | Freq: Every evening | ORAL | Status: DC | PRN

## 2015-07-13 DIAGNOSIS — Z1231 Encounter for screening mammogram for malignant neoplasm of breast: Secondary | ICD-10-CM | POA: Diagnosis not present

## 2015-07-14 ENCOUNTER — Encounter: Payer: Self-pay | Admitting: *Deleted

## 2015-07-25 ENCOUNTER — Other Ambulatory Visit: Payer: Self-pay | Admitting: *Deleted

## 2015-08-26 ENCOUNTER — Telehealth: Payer: Self-pay | Admitting: Family Medicine

## 2015-08-26 ENCOUNTER — Ambulatory Visit (INDEPENDENT_AMBULATORY_CARE_PROVIDER_SITE_OTHER): Payer: Commercial Managed Care - HMO | Admitting: Family Medicine

## 2015-08-26 ENCOUNTER — Ambulatory Visit (HOSPITAL_COMMUNITY)
Admission: RE | Admit: 2015-08-26 | Discharge: 2015-08-26 | Disposition: A | Discharge status: Home / Self Care | Payer: Commercial Managed Care - HMO | Source: Ambulatory Visit | Attending: Family Medicine | Admitting: Family Medicine

## 2015-08-26 ENCOUNTER — Encounter: Payer: Self-pay | Admitting: Family Medicine

## 2015-08-26 VITALS — BP 140/67 | HR 75 | Temp 97.9°F | Wt 147.7 lb

## 2015-08-26 DIAGNOSIS — M25562 Pain in left knee: Secondary | ICD-10-CM | POA: Diagnosis not present

## 2015-08-26 DIAGNOSIS — M24652 Ankylosis, left hip: Secondary | ICD-10-CM | POA: Diagnosis not present

## 2015-08-26 DIAGNOSIS — M76892 Other specified enthesopathies of left lower limb, excluding foot: Secondary | ICD-10-CM | POA: Diagnosis not present

## 2015-08-26 DIAGNOSIS — M179 Osteoarthritis of knee, unspecified: Secondary | ICD-10-CM | POA: Diagnosis not present

## 2015-08-26 DIAGNOSIS — M1612 Unilateral primary osteoarthritis, left hip: Secondary | ICD-10-CM | POA: Insufficient documentation

## 2015-08-26 MED ORDER — METHYLPREDNISOLONE ACETATE 40 MG/ML IJ SUSP
40.0000 mg | Freq: Once | INTRAMUSCULAR | Status: AC
  Administered 2015-08-26: 40 mg via INTRA_ARTICULAR

## 2015-08-26 NOTE — Progress Notes (Signed)
    Subjective   Yvonne [name] is a 75 y.o. female that presents for a same day visit  1. Left knee pain: Symptoms started two months ago. She reports no known injury precipitating events. She reports running previously but has not ran for about 3-4 years. She reports mild intermittent swelling. Pain is locate around her whole knee with some radiation down her leg. She has pain with ambulation. She reports some catching of her knees and has falls at least once per week. Lidocaine gel and Aleve help. Symptoms getting.   ROS Per HPI  Social History  Substance Use Topics  . Smoking status: Never Smoker   . Smokeless tobacco: Never Used  . Alcohol Use: No    No Known Allergies  Objective   BP 140/67 mmHg  Pulse 75  Temp(Src) 97.9 F (36.6 C) (Oral)  Wt 147 lb 11.2 oz (66.996 kg)  General: Well appearing, no distress Musculoskeletal:  Left hip: About 20 degrees of internal/external rotation which is severely limited compared to right. Hip hip tenderness. No trochanter tubercle tenderness. FADIR and FABER negative. 3/5 hip strength. Left knee: no crepitus, no deformity. Full range of motion compared to right. No medial/lateral joint line tenderness, no patellar tendon tenderness. Negative lachman's, anterior/posterior drawer, varus/valgus. 3/5 leg strength. Normal reflexes  Assessment and Plan   INJECTION: Patient was given informed consent, signed copy in the chart. Appropriate time out was taken. Area prepped and draped in usual sterile fashion. 1 cc of methylprednisolone 40 mg/ml plus  3 cc of 1% lidocaine without epinephrine was injected into the left knee using a medial approach. The patient tolerated the procedure well. There were no complications. Post procedure instructions were given.  1. Left knee pain Possible arthritis. Patient requesting injection. Tolerated well with minimal initial improvement. Will also get plain films.  - DG Knee AP/LAT W/Sunrise Left; Future - DG  HIP UNILAT WITH PELVIS 2-3 VIEWS LEFT; Future - methylPREDNISolone acetate (DEPO-MEDROL) injection 40 mg; Inject 1 mL (40 mg total) into the articular space once.  2. Decreased range of motion of hip, left Limited motion. Don't suspect fracture but likely significant arthritis that is likely contributing to knee pain. Will need physical therapy vs orthopedic referral since falling frequently. Recommended walking aid. - DG HIP UNILAT WITH PELVIS 2-3 VIEWS LEFT; Future

## 2015-08-26 NOTE — Telephone Encounter (Signed)
Pt called because Yvonne [name] seen Dr. Lonny Prude earlier today and then went and had some x-rays done. Yvonne [name] was told to come and have PT, but Yvonne [name] just can not do this, Yvonne [name] is not feeling well. Yvonne [name] will do the PT on Monday. jw

## 2015-08-26 NOTE — Patient Instructions (Signed)
Thank you for coming to see me today. It was a pleasure. Today we talked about:   Knee pain: I injected your knee. Hopefully this starts to help your pain. Please get a walker to help you stay up. Please get the x-rays. I will refer you to physical therapy after your x-rays.  Please make an appointment to see Dr. Kennon Rounds in 1-2 weeks.  If you have any questions or concerns, please do not hesitate to call the office at [phone].  Sincerely,  Cordelia Poche, MD

## 2015-08-29 NOTE — Telephone Encounter (Signed)
Reviewed Dr. Lisbeth Ply note from last week. Placed referral for PT for left knee pain.

## 2015-08-29 NOTE — Telephone Encounter (Signed)
Patient walked in in today, stated she was here to do PT with Dr. Lonny Prude. I advised patient that Dr. Lonny Prude was not here and that most likely Dr. Lonny Prude would be sending a referral to [facility] for PT. Please advise.

## 2015-08-30 ENCOUNTER — Telehealth: Payer: Self-pay | Admitting: *Deleted

## 2015-08-30 NOTE — Telephone Encounter (Signed)
Received faxed refill request from [place], [place] (phone [phone] fax [phone]) for lidocaine and prilocaine cream, 2.5%/2.5%, 720 g for 90 days, apply 2 g up to 4 times daily to affected area(s) with 3 refills. This med is not on current or historical med list Fax placed in PCP box and note routed to PCP, Dr. Kennon Rounds, for review.  Velora Heckler, RN

## 2015-09-01 ENCOUNTER — Ambulatory Visit (INDEPENDENT_AMBULATORY_CARE_PROVIDER_SITE_OTHER): Payer: Commercial Managed Care - HMO | Admitting: Family Medicine

## 2015-09-01 ENCOUNTER — Encounter: Payer: Self-pay | Admitting: Family Medicine

## 2015-09-01 VITALS — BP 131/67 | HR 86 | Temp 97.7°F | Ht 62.0 in | Wt 150.0 lb

## 2015-09-01 DIAGNOSIS — H9193 Unspecified hearing loss, bilateral: Secondary | ICD-10-CM | POA: Diagnosis not present

## 2015-09-01 DIAGNOSIS — Z7409 Other reduced mobility: Secondary | ICD-10-CM

## 2015-09-01 DIAGNOSIS — G3184 Mild cognitive impairment, so stated: Secondary | ICD-10-CM | POA: Diagnosis not present

## 2015-09-01 DIAGNOSIS — H5440 Blindness, one eye, unspecified eye: Secondary | ICD-10-CM

## 2015-09-01 DIAGNOSIS — M25552 Pain in left hip: Secondary | ICD-10-CM

## 2015-09-01 DIAGNOSIS — H5441 Blindness, right eye, normal vision left eye: Secondary | ICD-10-CM | POA: Diagnosis not present

## 2015-09-01 DIAGNOSIS — R269 Unspecified abnormalities of gait and mobility: Secondary | ICD-10-CM

## 2015-09-01 DIAGNOSIS — M1612 Unilateral primary osteoarthritis, left hip: Secondary | ICD-10-CM

## 2015-09-01 MED ORDER — SLIP-ON KNEE BRACE MISC: 1.0000 [IU] | Status: DC | PRN

## 2015-09-01 MED ORDER — LUMBAR BACK BRACE/SUPPORT PAD MISC: 1.0000 [IU] | Status: DC | PRN

## 2015-09-01 MED ORDER — LIDOCAINE-PRILOCAINE 2.5-2.5 % EX CREA: 1.0000 "application " | TOPICAL_CREAM | CUTANEOUS | Status: DC | PRN

## 2015-09-02 ENCOUNTER — Encounter: Payer: Self-pay | Admitting: Family Medicine

## 2015-09-02 DIAGNOSIS — H9190 Unspecified hearing loss, unspecified ear: Secondary | ICD-10-CM | POA: Insufficient documentation

## 2015-09-02 DIAGNOSIS — H5440 Blindness, one eye, unspecified eye: Secondary | ICD-10-CM | POA: Insufficient documentation

## 2015-09-02 DIAGNOSIS — IMO0001 Reserved for inherently not codable concepts without codable children: Secondary | ICD-10-CM | POA: Insufficient documentation

## 2015-09-02 DIAGNOSIS — G3184 Mild cognitive impairment, so stated: Secondary | ICD-10-CM

## 2015-09-02 DIAGNOSIS — Z7409 Other reduced mobility: Secondary | ICD-10-CM | POA: Insufficient documentation

## 2015-09-02 HISTORY — DX: Other reduced mobility: Z74.09

## 2015-09-02 HISTORY — DX: Mild cognitive impairment of uncertain or unknown etiology: G31.84

## 2015-09-02 NOTE — Progress Notes (Signed)
Patient ID: Yvonne [name], female   DOB: Jul 26, 1940, 75 y.o.   MRN: [id number] [facility]:   Patient is accompanied by: self Primary caregiver: self  Patient's lives alone. Patient information was obtained from patient, past medical records and Dr Yvonne [name]. History/Exam limitations: none. Primary Care Provider: Donnamae Jude, MD Referring provider: Dorcas Mcmurray, MD Reason for referral:  Chief Complaint  Patient presents with  . Memory Loss   History Chief Complaint  Patient presents with  . Memory Loss  Patient is not certain why she is being seen in the [facility] today.     HPI by problems:   Cognitive impairment concern  Ms Yvonne [name] came into [facility] today for "physical therapy" by Dr Yvonne [name] for left hip and knee pain.  She had a referral made for outpatient PT by Dr Yvonne [name].  Dr Yvonne [name] saw the patient today to assess the patient.  Dr Yvonne [name] had concerns for patient's cognitive capacity given the patient's misunderstanding about physical therapy and her appearance of confusion during Dr Yvonne [name] interview.   What problems with thinking are there? memory loss and difficulty understanding instructions  When were the changes first noticed? there are no prior notes mentioning complaints or diagnoses of confusion.  ago There is a prior Annual Wellness Visit MMSE from 05/04/2011 with 27 out of 30.   Did this change occur abruptly or gradually? unknown  How have the changes progressed since then? unknown  Is their speech disorganized, rambling? no, though there is some delay in verbal responses  Has there been any tremors or abnormal movements? No  CEREBROVASCULAR ACCIDENT, HX OF 07/12/2009       Outpatient Encounter Prescriptions as of 09/01/2015  Medication Sig  . amitriptyline (ELAVIL) 50 MG tablet Take 3 tablets (150 mg total) by mouth at bedtime.  Marland Kitchen amLODipine (NORVASC) 5 MG tablet Take 1 tablet (5 mg total) by mouth daily.  Marland Kitchen aspirin 81 MG  EC tablet Take 1 tablet (81 mg total) by mouth daily.  Marland Kitchen atorvastatin (LIPITOR) 10 MG tablet Take 1 tablet (10 mg total) by mouth daily.  . benazepril-hydrochlorthiazide (LOTENSIN HCT) 20-25 MG per tablet Take 1 tablet by mouth daily.  . cholecalciferol (VITAMIN D) 400 UNITS TABS Take 2.5 tablets (1,000 Units total) by mouth daily.  . fish oil-omega-3 fatty acids 1000 MG capsule Take 1,000 mg by mouth daily.  Marland Kitchen levothyroxine (SYNTHROID, LEVOTHROID) 75 MCG tablet Take 1 tablet (75 mcg total) by mouth daily.  . Multiple Vitamin (MULTIVITAMIN) tablet Take 1 tablet by mouth daily.    . Naproxen Sod-Diphenhydramine (ALEVE PM) 220-25 MG TABS Take by mouth.  . naproxen sodium (ANAPROX) 220 MG tablet Take 220 mg by mouth 2 (two) times daily with a meal.  . Wheat Dextrin (EQ FIBER POWDER) POWD Take 1 Container by mouth daily.  . zaleplon (SONATA) 5 MG capsule Take 1 capsule (5 mg total) by mouth at bedtime as needed for sleep.   No facility-administered encounter medications on file as of 09/01/2015.   History Patient Active Problem List   Diagnosis Date Noted  . Skin nodule 09/13/2014  . Osteopenia 08/30/2014  . Diffuse pain 04/07/2014  . Numbness in both legs 10/16/2012  . Constipation 06/04/2012  . Hypercholesteremia 09/05/2011  . Osteoarthritis 04/12/2011  . 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 Hypothyroidism 07/12/2009  . ANXIETY 07/12/2009  . INSOMNIA, PERSISTENT 07/12/2009  . Essential hypertension 07/12/2009  . CEREBROVASCULAR ACCIDENT, HX OF 07/12/2009  Past Medical History  Diagnosis Date  . Allergy   . Hypertension   . Thyroid disease   . Stroke [facility])     pt was 40  . Mild cognitive impairment with memory loss 09/02/2015  . Impaired functional mobility, balance, gait, and endurance 09/02/2015   Past Surgical History  Procedure Laterality Date  . Abdominal hysterectomy    . Brain surgery    . Toe amputation  2013    2nd toe on right foot,  hammer toe   Family History  Problem Relation Age of Onset  . Thyroid disease Mother   . Heart disease Father    Social History   Social History  . Marital Status: Single    Spouse Name: N/A  . Number of Children: 2  . Years of Education: masters   Occupational History  . Retired-teacher    Social History Main Topics  . Smoking status: Never Smoker   . Smokeless tobacco: Never Used  . Alcohol Use: No  . Drug Use: No  . Sexual Activity: Not Asked   Other Topics Concern  . None   Social History Narrative   Health Care POA:    Emergency Contact: son, Doreene Eland, (c) [phone]   End of Life Plan:    Who lives with you: self   Any pets: 2 cats, Sadie and Katie   Diet: Pt has a varied diet and tries to limit sweets.    Exercise: Pt walks on treadmill 10 minutes a day and uses bike 10 minutes a day.   Seatbelts: Pt reports wearing seatbelt when in vehicles.    Sun Exposure/Protection: Pt does not use sun protection.   Hobbies: reading, watching movies, writing               Basic Activities of Daily Living  ADLs Independent Needs Assistance Dependent  Bathing x    Dressing x    Ambulation x    Toileting x    Eating x     Instrumental Activities of Daily Living IADL Independent Needs Assistance Dependent  Cooking x    Housework x    Manage Medications x    Manage the telephone x    Shopping for food, clothes, Meds, etc x    Use transportation x    Manage Finances x      Caregivers in home: self   FALLS in last five office visits:  Fall Risk  09/01/2015 03/17/2015 12/31/2014 08/30/2014 04/07/2014  Falls in the past year? Yes No Yes No No  Number falls in past yr: 1 - 2 or more - -  Injury with Fall? - - No - -  Risk for fall due to : - - - - Impaired balance/gait    Health Maintenance reviewed: Immunization History  Administered Date(s) Administered  . Influenza-Unspecified 04/18/2014, 03/08/2015  . Pneumococcal Conjugate-13 08/30/2014  .  Pneumococcal Polysaccharide-23 05/04/2011  . Tdap 04/12/2011     Diet: Regular   Geriatric Syndromes: Constipation no ,   Incontinence yes  Dizziness no   Syncope no   Skin problems no   Visual Impairment yes   Hearing impairment yes  Eating impairment no  Impaired Memory or Cognition yes   Behavioral problems no   Sleep problems yes   Weight loss no    ROS Denies fevers/chills; denies changes in appetite; denies changes in weight;  Denies changes in vision / hearing / smell / taste; Denies runny nose / ear pain or discharge /  sore throat / sinus congestion / cough/w phlegm; Denies chest congestion / wheezing;  Denies chest pain; denies heart beating slower/thumps inside chest; denies racing heart/flutter; Denies dysuria; denies hematuria;  Denies constipation; denies melena/hematochezia; denies diarrhea;  Denies abdominal discomfort/gaseous bloating; denies N/V; denies heart burn;  Denies recent falls/unsteady gait;  Denies unilateral weakness / clumsiness / tingling / numbness; denies tremors;  Denies sadness / anxiety / suicidal tendencies  Vital Signs Weight: 150 lb (68.04 kg) Body mass index is 27.43 kg/(m^2). CrCl cannot be calculated (Patient has no serum creatinine result on file.). Body surface area is 1.72 meters squared. Filed Vitals:   09/01/15 1417  BP: 131/67  Pulse: 86  Temp: 97.7 F (36.5 C)  TempSrc: Oral  Height: 5\' 2"  (1.575 m)  Weight: 150 lb (68.04 kg)   Wt Readings from Last 3 Encounters:  09/01/15 150 lb (68.04 kg)  08/26/15 147 lb 11.2 oz (66.996 kg)  03/17/15 152 lb (68.947 kg)    Hearing Screening   Method: Audiometry   125Hz  250Hz  500Hz  1000Hz  2000Hz  4000Hz  8000Hz   Right ear:   Fail Fail 40 40   Left ear:   Fail Fail Fail Fail     Visual Acuity Screening   Right eye Left eye Both eyes  Without correction: 20/20 20/200 20/20  With correction:     Comments: Has had cataract surgery and laser eye repair    Physical  Examination:  VS reviewed GEN: Alert, Cooperative, Groomed, NAD HEENT: PERRL; EAC bilaterally not occluded, TM's translucent with normal LM, (+) LR;                No cervical LAN, No thyromegaly, No palpable masses COR: RRR, No M/G/R, No JVD, Normal PMI size and location LUNGS: BCTA, No Acc mm use, speaking in full sentences   EXT: No peripheral leg edema  Neuro: Oriented to person, place, and time; Cerebellar:Rhomberg negative; Muscle Tone normal; Tremor not present  Sit-to-Stand Test: 6 / 30-seconds (low for age) 24-Stage Stand Test:  Feet Side-by-side: Yes.       Feet Semi-tandem: Yes    Feet Tandem: No.      Gait: slow Psych: Normal affect/thought/slower repsonse time but fleunt and speech/ language- capable of abstraction, linear, appropriate    Mini-Mental State Examination or Montreal Cognitive Assessment:  Patient did  require additional cues or prompts to complete tasks. Patient was cooperative and attentive to testing tasks Patient did  appear motivated to perform well  MMSE - Mini Mental State Exam 05/04/2011  Orientation to time 5  Orientation to Place 5  Registration 3  Attention/ Calculation 3  Recall 2  Language- name 2 objects 2  Language- repeat 1  Language- follow 3 step command 3  Language- read & follow direction 1  Write a sentence 1  Copy design 1  Total score 27        Montreal Cognitive Assessment  09/01/2015  Visuospatial/ Executive (0/5) 2  Naming (0/3) 3  Attention: Read list of digits (0/2) 2  Attention: Read list of letters (0/1) 1  Attention: Serial 7 subtraction starting at 100 (0/3) 3  Language: Repeat phrase (0/2) 1  Language : Fluency (0/1) 0  Abstraction (0/2) 1  Delayed Recall (0/5) 2  Orientation (0/6) 6  Total 21    Geriatric Depression Scale:  7 / 15  Labs  Lab Results  Component Value Date   VITAMINB12 579 10/16/2012    No results found for: FOLATE  Lab Results  Component Value Date   TSH 2.268 12/31/2014     No results found for: RPR    Chemistry      Component Value Date/Time   NA 138 12/08/2014 1019   K 3.9 12/08/2014 1019   CL 101 12/08/2014 1019   CO2 28 12/08/2014 1019   BUN 26* 12/08/2014 1019   CREATININE 0.87 12/08/2014 1019   CREATININE 0.68 11/02/2013 1502      Component Value Date/Time   CALCIUM 9.5 12/08/2014 1019   ALKPHOS 83 12/08/2014 1019   AST 15 12/08/2014 1019   ALT 14 12/08/2014 1019   BILITOT 0.3 12/08/2014 1019       No results found for: HGBA1C    Lab Results  Component Value Date   WBC 10.8* 11/02/2013   HGB 13.0 11/02/2013   HCT 37.1 11/02/2013   MCV 84.9 11/02/2013   PLT 417* 11/02/2013    No results found for this or any previous visit (from the past 24 hour(s)).  Imaging Head CT: none  Brain [id number]   Assessment and Plan: Problem List Items Addressed This Visit      Unprioritized   Mild cognitive impairment with memory loss - Primary   Impaired functional mobility, balance, gait, and endurance    Established problem worsened.  Poor balance and proximal leg muscle weakness.  Ms Kreft is willing to participate in outpatient physical therapy for balance training and leg strength training, recommendation for ambulatory assist devices, their fitting to patient and the patient's instruction in their proper use.  Referral for outpatient PT made.  Ms Marotz has upcoming hip CT pending to look into cause of her l progressive left hip pain that is impairing her ambulation      Relevant Orders   Ambulatory referral to Physical Therapy   Hearing impairment     09/01/15               500 Hz 1000 Hz 2000 Hz 4000 Hz Right        Fail    Fail                40 db    40 db  Left       Fail     Fail     Fail     Fail      Blind right eye      Personal Strengths Capable of independent living Motivation for treatment/growth   Advanced Directives: Code Status: Full code   Montreal Cognitive Assessment  09/01/2015  Visuospatial/  Executive (0/5) 2  Naming (0/3) 3  Attention: Read list of digits (0/2) 2  Attention: Read list of letters (0/1) 1  Attention: Serial 7 subtraction starting at 100 (0/3) 3  Language: Repeat phrase (0/2) 1  Language : Fluency (0/1) 0  Abstraction (0/2) 1  Delayed Recall (0/5) 2  Orientation (0/6) 6  Total 21  Contact: First and Last Name if other than the patient involved:  Name Rel to patient Home phone Work phone Mobile phone Comment   Ned Card [phone]      [phone] (home) [phone] (work)

## 2015-09-02 NOTE — Assessment & Plan Note (Addendum)
New problem While Yvonne [name]'s MoCa score of 21 out of 30 is greater than two standard deviations below mean and has shown progressive worsening over the last 4 years, she remains independent in her reported abilities for iADLs and ADLs, so technically does not meet the DSM-V criteria for Dementia.  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  Patients with MCI show a rate of progression to dementia of 5 to 10% annually.   Only interventions in MCI with evidence of delaying progression to dementia are Mediterranean Diet and group educational activities.  These were discussed with Yvonne [name].  Recommend avoiding Zaleplon and Amitriptyline prescribing.    Zaleplon, like all Benzodiazepine-receptor agonists have adverse events similar to those of benzodiazepines in older adults (e.g., delirium, falls, fractures); increased emergency department visits and hospitalizations; motor vehicle crashes; with  Minimal improvement in sleep latency and sleep duration.  Amitriptyline is highly anticholinergic, sedating, and cause orthostatic hypotension.  Alternatively, low dose doxepin (?6 mg/d) has safety profile comparable with that of placebo and has some efficacy with insomnia outcomes in older adults.

## 2015-09-02 NOTE — Assessment & Plan Note (Signed)
Established problem worsened.  Poor balance and proximal leg muscle weakness.  Yvonne [name] is willing to participate in outpatient physical therapy for balance training and leg strength training, recommendation for ambulatory assist devices, their fitting to patient and the patient's instruction in their proper use.  Referral for outpatient PT made.  Yvonne [name] has upcoming hip CT pending to look into cause of her l progressive left hip pain that is impairing her ambulation

## 2015-09-02 NOTE — Progress Notes (Signed)
Patient ID: Yvonne [name], female   DOB: 1940-08-02, 75 y.o.   MRN: [id number] Patient came to triage today without a specific appointment. It seems she thought she was supposed to follow-up with physical therapy or with her PCP. It's not clear whether she thought this was the physical therapy office or the physician's office. She complained that she was having increasing problems walking since her knee injection last week.  I spoke with her at length and reviewed her office notes from last week. She seemed a little bit confused so I opted to have her seen later this afternoon in the geriatric assessment clinic. After discussion with her, her knee and hip pain are no worse than they were before the injection. She is not using any assistive devices at home currently and I would like the geriatric assessment to also look at that this afternoon. I looked at her films of her hip, and there is some question of whether not she has some flattening of the femoral head. I will get CT scan to rule out avascular necrosis. I'm not sure she would be a great operative candidate for total hip replacement unless we were forced into that direction with avascular necrosis. Regarding her knee she is wearing a knee brace and seems to be doing actually little better since the shot. I will have her make an appointment with her PCP to be seen in the next week.

## 2015-09-02 NOTE — Assessment & Plan Note (Signed)
09/01/15               500 Hz 1000 Hz 2000 Hz 4000 Hz Right        Fail    Fail                40 db    40 db  Left       Fail     Fail     Fail     Fail

## 2015-09-06 ENCOUNTER — Ambulatory Visit (HOSPITAL_COMMUNITY)
Admission: RE | Admit: 2015-09-06 | Discharge: 2015-09-06 | Disposition: A | Discharge status: Home / Self Care | Payer: Commercial Managed Care - HMO | Source: Ambulatory Visit | Attending: Family Medicine | Admitting: Family Medicine

## 2015-09-06 DIAGNOSIS — M25552 Pain in left hip: Secondary | ICD-10-CM | POA: Insufficient documentation

## 2015-09-06 DIAGNOSIS — D171 Benign lipomatous neoplasm of skin and subcutaneous tissue of trunk: Secondary | ICD-10-CM | POA: Insufficient documentation

## 2015-09-06 DIAGNOSIS — M1612 Unilateral primary osteoarthritis, left hip: Secondary | ICD-10-CM | POA: Insufficient documentation

## 2015-09-06 DIAGNOSIS — R269 Unspecified abnormalities of gait and mobility: Secondary | ICD-10-CM | POA: Insufficient documentation

## 2015-09-12 ENCOUNTER — Ambulatory Visit: Payer: Commercial Managed Care - HMO | Attending: Family Medicine | Admitting: Physical Therapy

## 2015-09-12 DIAGNOSIS — M79661 Pain in right lower leg: Secondary | ICD-10-CM

## 2015-09-12 DIAGNOSIS — R269 Unspecified abnormalities of gait and mobility: Secondary | ICD-10-CM | POA: Diagnosis not present

## 2015-09-12 DIAGNOSIS — M79662 Pain in left lower leg: Secondary | ICD-10-CM | POA: Diagnosis not present

## 2015-09-12 DIAGNOSIS — R293 Abnormal posture: Secondary | ICD-10-CM | POA: Insufficient documentation

## 2015-09-12 DIAGNOSIS — R262 Difficulty in walking, not elsewhere classified: Secondary | ICD-10-CM | POA: Diagnosis not present

## 2015-09-13 ENCOUNTER — Ambulatory Visit: Payer: Commercial Managed Care - HMO | Admitting: Physical Therapy

## 2015-09-13 ENCOUNTER — Ambulatory Visit: Payer: Commercial Managed Care - HMO

## 2015-09-13 ENCOUNTER — Encounter: Payer: Self-pay | Admitting: Family Medicine

## 2015-09-13 DIAGNOSIS — R269 Unspecified abnormalities of gait and mobility: Secondary | ICD-10-CM | POA: Diagnosis not present

## 2015-09-13 DIAGNOSIS — R262 Difficulty in walking, not elsewhere classified: Secondary | ICD-10-CM | POA: Diagnosis not present

## 2015-09-13 DIAGNOSIS — R293 Abnormal posture: Secondary | ICD-10-CM

## 2015-09-13 DIAGNOSIS — M79662 Pain in left lower leg: Secondary | ICD-10-CM | POA: Diagnosis not present

## 2015-09-13 DIAGNOSIS — M79661 Pain in right lower leg: Secondary | ICD-10-CM | POA: Diagnosis not present

## 2015-09-13 NOTE — Therapy (Signed)
[place] [address] [place], [place], [address] Phone: [phone]   Fax:  [phone]  Physical Therapy Evaluation  Patient Details  Name: Yvonne [name] MRN: [id number] Date of Birth: 03-Jan-1941 Referring Provider: Lissa Morales D (PCP: Darron Doom)  Encounter Date: 09/12/2015      PT End of Session - 09/12/15 1625    Visit Number 1   Number of Visits 12   Date for PT Re-Evaluation 10/24/15   PT Start Time [id number]   PT Stop Time 1615   PT Time Calculation (min) 45 min   Equipment Utilized During Treatment Gait belt   Activity Tolerance Patient tolerated treatment well   Behavior During Therapy [facility] for tasks assessed/performed      Past Medical History  Diagnosis Date  . Allergy   . Hypertension   . Thyroid disease   . Stroke [facility])     pt was 40  . Mild cognitive impairment with memory loss 09/02/2015  . Impaired functional mobility, balance, gait, and endurance 09/02/2015    Past Surgical History  Procedure Laterality Date  . Abdominal hysterectomy    . Brain surgery    . 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  Visit Diagnosis:  Abnormality of gait - Plan: PT plan of care cert/re-cert  Pain in both lower legs - Plan: PT plan of care cert/re-cert  Difficulty in walking, not elsewhere classified - Plan: PT plan of care cert/re-cert  Abnormal posture - Plan: PT plan of care cert/re-cert      Subjective Assessment - 09/12/15 1533    Subjective Pt is a 75 y/o female who presents to OPPT with difficulty ambulating due to pain in bil knee and hip/buttock and burning in bil calves.  Pt states "I guess that's all arthritis."  Pt reports symptoms began 2 months ago.     Pertinent History HTN, CVA, mild cognitive impairment   Limitations Walking;Standing;House hold activities   How long can you stand comfortably? 5 min - c/o feet going  numb   How long can you walk comfortably? 5 min   Diagnostic tests MRI: arthritis   Patient Stated Goals improve mobility   Currently in Pain? Yes   Pain Score 6    Pain Location Hip  and calf   Pain Orientation Right;Left   Pain Descriptors / Indicators Burning   Pain Type --  sub acute   Pain Radiating Towards bil feet   Pain Onset More than a month ago   Pain Frequency Intermittent   Aggravating Factors  standing and walking   Pain Relieving Factors sitting, lying down, medication (pt not currently taking any)            [facility] PT Assessment - 09/12/15 1539    Assessment   Medical Diagnosis abnormality of gait; bil LE pain   Referring Provider MCDIARMID, TODD D (PCP: Darron Doom)   Onset Date/Surgical Date --  2 months   Next MD Visit NOT SCHEDULED   Prior Therapy none   Precautions   Precautions Fall   Restrictions   Weight Bearing Restrictions No   Balance Screen   Has the patient fallen in the past 6 months Yes   How many times? "about every day or two"   Has the patient had a decrease in activity level because of a fear of falling?  Yes  Is the patient reluctant to leave their home because of a fear of falling?  Yes   [place] residence   Living Arrangements Alone   Type of [place] to enter   Entrance Stairs-Number of Steps 5   Entrance Stairs-Rails Right;Left;Can reach both   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   Leisure reading   Observation/Other Assessments   Focus on Therapeutic Outcomes (FOTO)  not completed due to cognition   Posture/Postural Control   Posture/Postural Control Postural limitations   Postural Limitations Left pelvic obliquity;Posterior pelvic tilt;Decreased lumbar lordosis;Rounded  Shoulders;Forward head;Flexed trunk   Strength   Overall Strength Comments --   Right Hip Flexion 4/5   Right Hip ABduction 3/5   Left Hip Flexion 3+/5   Left Hip ABduction 2/5   Right Knee Flexion 4/5   Right Knee Extension 4/5   Left Knee Flexion 4/5   Left Knee Extension 4/5   Right Ankle Dorsiflexion 4/5   Left Ankle Dorsiflexion 3+/5   Palpation   Palpation comment tightness and tenderness noted along ITB bil; no significant tendernes bil hips or gastrocs   Special Tests    Special Tests Lumbar   Lumbar Tests Straight Leg Raise;other   Straight Leg Raise   Findings Negative   other   Comments manual traction performed with pt reported relief; unable to explain if symptoms in LEs improved   Ambulation/Gait   Ambulation/Gait Yes   Ambulation/Gait Assistance 5: Supervision   Ambulation Distance (Feet) 150 Feet   Assistive device None   Gait Pattern Decreased stance time - left;Decreased step length - right;Antalgic;Trunk flexed   Gait velocity 1.71 ft/sec  19.15   Gait Comments amb 75' with RW with supervision; recommended pt use RW at all times - she has one at home   Standardized Balance Assessment   Standardized Balance Assessment Timed Up and Go Test   Timed Up and Go Test   Normal TUG (seconds) 14.57                           PT Education - 09/12/15 1624    Education provided Yes   Education Details clinical findings; goals of care; POC   Person(s) Educated Patient   Methods Explanation   Comprehension Verbalized understanding          PT Short Term Goals - 09/13/15 0733    PT SHORT TERM GOAL #1   Title independent with HEP (10/03/15)   Time 3   Period Weeks   Status New           PT Long Term Goals - 09/13/15 0734    PT LONG TERM GOAL #1   Title improve timed up and go to < 13 sec for improved mobiilty (10/24/15)   Time 6   Period Weeks   Status New   PT LONG TERM GOAL #2   Title improve gait velocity to > 2.0 ft/sec for  improved mobility (10/24/15)   Time 6   Period Weeks   Status New   PT LONG TERM GOAL #3   Title ambulate > 300' with LRAD modified independent for improved function (10/24/15)   Time 6  Period Weeks   Status New   PT LONG TERM GOAL #4   Title report ability to stand/walk at least 20 min without increase in pain for improved function (10/24/15)   Time 6   Period Weeks   Status New               Plan - 09-30-2015 0729    Clinical Impression Statement Pt is a 75 y/o female who presents to OPPT with bil lower extremity pain affecting safe functional mobility.  Pt demonstrates pain, weakness, gait abnormalities and instability affecting safe functional mobility.  Recommend use of RW (pt bought one from a friend; so may need adjusting) at all times to decrease fall risk.  Feel lower extremity pain and weakness may be related to lumbosacral dysfunction therefore recommend transfer to our ortho location for possible traction.  Pt will benefit from PT to maximize function and address deficits listed.   Pt will benefit from skilled therapeutic intervention in order to improve on the following deficits Abnormal gait;Decreased activity tolerance;Decreased balance;Decreased cognition;Decreased endurance;Decreased safety awareness;Pain;Decreased mobility;Postural dysfunction;Difficulty walking;Decreased knowledge of use of DME;Decreased strength   Rehab Potential Fair   PT Frequency 2x / week   PT Duration 6 weeks   PT Treatment/Interventions ADLs/Self Care Home Management;Cryotherapy;Electrical Stimulation;Moist Heat;Traction;Ultrasound;Patient/family education;Balance training;Neuromuscular re-education;Therapeutic exercise;Therapeutic activities;Functional mobility training;Gait training;Stair training;DME Instruction;Manual techniques   PT Next Visit Plan trial traction; HEP for hip/core/lower extremity strengthening   Consulted and Agree with Plan of Care Patient          G-Codes - Sep 30, 2015  0736    Functional Assessment Tool Used clinical judgement: gait velocity 1.71 ft/sec   Functional Limitation Mobility: Walking and moving around   Mobility: Walking and Moving Around Current Status [id number]) At least 60 percent but less than 80 percent impaired, limited or restricted   Mobility: Walking and Moving Around Goal Status [phone]) At least 20 percent but less than 40 percent impaired, limited or restricted       Problem List Patient Active Problem List   Diagnosis Date Noted  . Mild cognitive impairment with memory loss 09/02/2015  . Impaired functional mobility, balance, gait, and endurance 09/02/2015  . Hearing impairment 09/02/2015  . 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 Skin nodule 09/30/14  . Osteopenia 08/30/2014  . Diffuse pain 04/07/2014  . Numbness in both legs 10/16/2012  . Constipation 06/04/2012  . Hypercholesteremia 09/05/2011  . Osteoarthritis 04/12/2011  . 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 Hypothyroidism 07/12/2009  . ANXIETY 07/12/2009  . INSOMNIA, PERSISTENT 07/12/2009  . Essential hypertension 07/12/2009  . CEREBROVASCULAR ACCIDENT, HX OF 07/12/2009   Laureen Abrahams, PT, DPT 09-30-15 7:38 AM  [place] [address] [place], [place], [address] Phone: [phone]   Fax:  [phone]  Name: KIRSI KALATA MRN: [id number] Date of Birth: 1941/03/29

## 2015-09-13 NOTE — Therapy (Signed)
[place], [place], [address] Phone: [phone]   Fax:  [phone]  Physical Therapy Treatment  Patient Details  Name: Yvonne [name] MRN: [id number] Date of Birth: Jan 01, 1941 Referring Provider: Lissa Morales D (PCP: Darron Doom)  Encounter Date: 09/13/2015      PT End of Session - 09/13/15 1514    Visit Number 2   Number of Visits 12   Date for PT Re-Evaluation 10/24/15   PT Start Time 0305   PT Stop Time 0350   PT Time Calculation (min) 45 min   Activity Tolerance Patient tolerated treatment well   Behavior During Therapy [facility] for tasks assessed/performed      Past Medical History  Diagnosis Date  . Allergy   . Hypertension   . Thyroid disease   . Stroke [facility])     pt was 40  . Mild cognitive impairment with memory loss 09/02/2015  . Impaired functional mobility, balance, gait, and endurance 09/02/2015    Past Surgical History  Procedure Laterality Date  . Abdominal hysterectomy    . Brain surgery    . 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  Visit Diagnosis:  Abnormality of gait  Pain in both lower legs  Difficulty in walking, not elsewhere classified  Abnormal posture      Subjective Assessment - 09/13/15 1510    Subjective Difficulty walking . Feet numbneess. Knees hurt.   OA with pain   Currently in Pain? Yes   Pain Score 3    Pain Location --  all over and hips mainly   Pain Orientation Left;Right   Pain Descriptors / Indicators Burning   Pain Radiating Towards both feet   Pain Onset More than a month ago   Pain Frequency Intermittent   Multiple Pain Sites --  all over whole body.            [facility] PT Assessment - 09/12/15 1539    Assessment   Medical Diagnosis abnormality of gait; bil LE pain   Referring Provider MCDIARMID, TODD D (PCP: Darron Doom)   Onset Date/Surgical Date --  2 months   Next MD Visit NOT SCHEDULED   Prior Therapy none   Precautions   Precautions Fall   Restrictions   Weight Bearing Restrictions No   Balance Screen   Has the patient fallen in the past 6 months Yes   How many times? "about every day or two"   Has the patient had a decrease in activity level because of a fear of falling?  Yes   Is the patient reluctant to leave their home because of a fear of falling?  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   Leisure reading   Observation/Other Assessments   Focus on Therapeutic Outcomes (FOTO)  not completed due to cognition   Posture/Postural Control   Posture/Postural Control Postural limitations   Postural Limitations Left pelvic  obliquity;Posterior pelvic tilt;Decreased lumbar lordosis;Rounded Shoulders;Forward head;Flexed trunk   Strength   Overall Strength Comments --   Right Hip Flexion 4/5   Right Hip ABduction 3/5   Left Hip Flexion 3+/5   Left Hip ABduction 2/5   Right Knee Flexion 4/5   Right Knee Extension 4/5   Left Knee Flexion 4/5   Left Knee Extension 4/5   Right Ankle Dorsiflexion 4/5   Left Ankle Dorsiflexion 3+/5   Palpation   Palpation comment tightness and tenderness noted along ITB bil; no significant tendernes bil hips or gastrocs   Special Tests    Special Tests Lumbar   Lumbar Tests Straight Leg Raise;other   Straight Leg Raise   Findings Negative   other   Comments manual traction performed with pt reported relief; unable to explain if symptoms in LEs improved   Ambulation/Gait   Ambulation/Gait  Yes   Ambulation/Gait Assistance 5: Supervision   Ambulation Distance (Feet) 150 Feet   Assistive device None   Gait Pattern Decreased stance time - left;Decreased step length - right;Antalgic;Trunk flexed   Gait velocity 1.71 ft/sec  19.15   Gait Comments amb 42' with RW with supervision; recommended pt use RW at all times - she has one at home   Standardized Balance Assessment   Standardized Balance Assessment Timed Up and Go Test   Timed Up and Go Test   Normal TUG (seconds) 14.57                     [facility] Adult PT Treatment/Exercise - 09/13/15 1517    Exercises   Exercises Knee/Hip   Knee/Hip Exercises: Aerobic   Nustep L3 LE only 5 min   Knee/Hip Exercises: Seated   Long Arc Quad Strengthening;Right;Left;1 set;15 reps 4 pounds 5 sec hold   Knee/Hip Exercises: Supine   Bridges Both;10 reps  some LT hip pain with this   Knee/Hip Exercises: Sidelying   Hip ABduction Right;Left;15 reps   Hip ABduction Limitations not full range and needed manual/verbal cues and contact for technique   Clams RT and L:T x 15  and reverse clams x 15  with cues for technique   Other Sidelying Knee/Hip Exercises active hip extension with verbal and manual assist      Standing with weight shift forward and back onto toe of back foot  with RT foot in front then LT foot in front with CG           PT Education - 09/12/15 1624    Education provided Yes   Education Details clinical findings; goals of care; POC   Person(s) Educated Patient   Methods Explanation   Comprehension Verbalized understanding          PT Short Term Goals - 09/13/15 0733    PT SHORT TERM GOAL #1   Title independent with HEP (10/03/15)   Time 3   Period Weeks   Status New           PT Long Term Goals - 09/13/15 0734    PT LONG TERM GOAL #1   Title improve timed up and go to < 13 sec for improved mobiilty (10/24/15)   Time 6   Period Weeks   Status New   PT LONG TERM GOAL #2   Title improve  gait velocity to > 2.0 ft/sec for improved mobility (10/24/15)   Time 6   Period Weeks   Status New   PT LONG TERM GOAL #3  Title ambulate > 300' with LRAD modified independent for improved function (10/24/15)   Time 6   Period Weeks   Status New   PT LONG TERM GOAL #4   Title report ability to stand/walk at least 20 min without increase in pain for improved function (10/24/15)   Time 6   Period Weeks   Status New               Plan - 10/11/15 1555    Clinical Impression Statement Ms Selinsky tolerated exercises but reported she thought she would feel it tomorrow. She is very weak and has pain with full LT knee extension in standing. ROM appears functional. I reenforced her need for walker for safety   PT Next Visit Plan trial traction; HEP for hip/core/lower extremity strengthening   Consulted and Agree with Plan of Care Patient          G-Codes - 2015-10-11 0736    Functional Assessment Tool Used clinical judgement: gait velocity 1.71 ft/sec   Functional Limitation Mobility: Walking and moving around   Mobility: Walking and Moving Around Current Status [id number]) At least 60 percent but less than 80 percent impaired, limited or restricted   Mobility: Walking and Moving Around Goal Status [phone]) At least 20 percent but less than 40 percent impaired, limited or restricted      Problem List Patient Active Problem List   Diagnosis Date Noted  . Mild cognitive impairment with memory loss 09/02/2015  . Impaired functional mobility, balance, gait, and endurance 09/02/2015  . Hearing impairment 09/02/2015  . 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 Skin nodule 11-Oct-2014  . Osteopenia 08/30/2014  . Diffuse pain 04/07/2014  . Numbness in both legs 10/16/2012  . Constipation 06/04/2012  . Hypercholesteremia 09/05/2011  . Osteoarthritis 04/12/2011  . 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 Hypothyroidism 07/12/2009  . ANXIETY 07/12/2009  . INSOMNIA, PERSISTENT  07/12/2009  . Essential hypertension 07/12/2009  . CEREBROVASCULAR ACCIDENT, HX OF 07/12/2009    Darrel Hoover PT 11-Oct-2015, 4:11 PM  [place] [facility] [address] [place], [place], [address] Phone: [phone]   Fax:  [phone]  Name: Yvonne [name] MRN: [id number] Date of Birth: 07-06-1940

## 2015-09-15 ENCOUNTER — Ambulatory Visit: Payer: Commercial Managed Care - HMO

## 2015-09-15 ENCOUNTER — Telehealth: Payer: Self-pay | Admitting: *Deleted

## 2015-09-15 DIAGNOSIS — M79661 Pain in right lower leg: Secondary | ICD-10-CM

## 2015-09-15 DIAGNOSIS — R262 Difficulty in walking, not elsewhere classified: Secondary | ICD-10-CM | POA: Diagnosis not present

## 2015-09-15 DIAGNOSIS — R269 Unspecified abnormalities of gait and mobility: Secondary | ICD-10-CM | POA: Diagnosis not present

## 2015-09-15 DIAGNOSIS — R293 Abnormal posture: Secondary | ICD-10-CM

## 2015-09-15 DIAGNOSIS — M79662 Pain in left lower leg: Secondary | ICD-10-CM | POA: Diagnosis not present

## 2015-09-15 DIAGNOSIS — I10 Essential (primary) hypertension: Secondary | ICD-10-CM

## 2015-09-15 NOTE — Patient Instructions (Addendum)
  Lower Trunk Rotation Stretch    Keeping back flat and feet together, rotate knees to left side. Hold 10-15____ seconds. Repeat _2-5___ times per set. Do __1__ sets per session. Do __1-2TORSO-STRENGTHENING: Single Leg Stretch  TORSO-STRENGTHENING: Single Leg Stretch    Inhaling, lift head and shoulders. Exhaling, bring right knee toward chest and wrap hands around knee . Inhaling, begin to switch legs and exhaling, pull left knee toward chest. Repeat _2-3__ times, alternating legs. Do _1-2__ times per day.  Copyright  VHI. All rights reserved.  SUPINE: Partial Sit-Up    Tighten abdominal muscles, raise head and shoulders off surface. Keep eyes on ceiling to avoid neck strain. _10__ reps per set, _1-2__ sets per day, __6_ days per week   Copyright  VHI. All rights reserved.    Copyright  VHI. All rights reserved.  __ sessions per day.  http://orth.exer.us/123   Copyright  VHI. All rights reserved.   http://orth.exer.us/98   Copyright  VHI. All rights reserved.  Hip Adduction: Leg Lift (Eccentric) - Side-Lying op leg bent, foot flat behind lower leg. Quickly lift lower leg. Slowly lower for 3-5 seconds. ___ reps per set, ___ sets per day, ___ days per week. Add ___ lbs when you achieve ___ repetitions.  Copyright  VHI. All rights reserved.  Abduction: Side Leg Lift (Eccentric) - Side-Lying   Lie on side. Lift top leg slightly higher than shoulder level. Keep top leg straight with body, toes pointing forward. Slowly lower for 3-5 seconds. _10-20__ reps per set, _1-2__ sets per day, _6__ days per week. Add ___ lbs when you achieve ___ repetitions.  Copyright  VHI. All rights reserved.  Strengthening: Straight Leg Raise (Phase 1)   Tighten muscles on front of right thigh, then lift leg _2-6___ inches from surface, keeping knee locked.  Repeat __10__ times per set. Do ___2_ sets per session. Do __1__ sessions per day.  http://orth.exer.us/614   Copyright   VHI. All rights reserved.

## 2015-09-15 NOTE — Telephone Encounter (Signed)
Pharmacist from [facility] called requesting to change to the directions of the lidocaine cream to Apply 2 gm up to 4 times a day as needed for pain. If approved please send in new Rx.  Derl Barrow, RN

## 2015-09-15 NOTE — Therapy (Signed)
[place], [place], [address] Phone: [phone]   Fax:  [phone]  Physical Therapy Treatment  Patient Details  Name: Yvonne [name] MRN: [id number] Date of Birth: July 05, 1940 Referring Provider: Lissa Morales D (PCP: Darron Doom)  Encounter Date: 09/15/2015      PT End of Session - 09/15/15 1816    Visit Number 3   Number of Visits 12   Date for PT Re-Evaluation 10/24/15   PT Start Time 0345   PT Stop Time 0435   PT Time Calculation (min) 50 min   Activity Tolerance Patient tolerated treatment well   Behavior During Therapy [name] for tasks assessed/performed      Past Medical History  Diagnosis Date  . Allergy   . Hypertension   . Thyroid disease   . Stroke [facility])     pt was 40  . Mild cognitive impairment with memory loss 09/02/2015  . Impaired functional mobility, balance, gait, and endurance 09/02/2015    Past Surgical History  Procedure Laterality Date  . Abdominal hysterectomy    . Brain surgery    . 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  Visit Diagnosis:  Pain in both lower legs  Difficulty in walking, not elsewhere classified  Abnormal posture      Subjective Assessment - 09/15/15 1811    Subjective Was not worse after last session. Did housework for 3 hours with some rest breaks today.  [place] Rile was in car   Currently in Pain? Yes   Pain Score 3    Pain Location --  all over , LT hip and knee   Pain Orientation Left   Pain Descriptors / Indicators Burning   Pain Onset More than a month ago   Pain Frequency Intermittent   Aggravating Factors  stand and walking   Pain Relieving Factors rest                         [facility] Adult PT Treatment/Exercise - 09/15/15 1556    Knee/Hip Exercises: Stretches   Other Knee/Hip Stretches lower trink roation  x 3 RT and LT with over pressure x 15 sec  and knee to chest RT and lt x 20-25 sec   Knee/Hip Exercises: Aerobic   Nustep L5 LE only 5 min   Knee/Hip Exercises: Standing   Other Standing Knee Exercises hand on wall sliding RT arm up wall and attempting to lift RT leg   Knee/Hip Exercises: Seated   Long Arc Quad Strengthening;Right;Left;1 set;15 reps   Long Arc Quad Weight 4 lbs.   Sit to [employer] 10 reps   Knee/Hip Exercises: Supine   Bridges Both;10 reps   Bridges with [employer] Both;Strengthening;10 reps   Bridges with Clamshell Strengthening;Both;10 reps     We spent the last 10 min reviewing HEP           PT Education - 09/15/15 1616    Education provided Yes   Education Details OA of LT hip and effect  on walking /balance / safety, HEP for stength and stretching hips   Person(s) Educated Patient   Methods Explanation;Tactile cues;Verbal cues;Handout   Comprehension Verbalized understanding;Returned demonstration  She had questions better addressed by MD          PT Short Term Goals - 09/15/15 1632    PT SHORT  TERM GOAL #1   Title independent with HEP (10/03/15)   Status On-going           PT Long Term Goals - 09/13/15 0734    PT LONG TERM GOAL #1   Title improve timed up and go to < 13 sec for improved mobiilty (10/24/15)   Time 6   Period Weeks   Status New   PT LONG TERM GOAL #2   Title improve gait velocity to > 2.0 ft/sec for improved mobility (10/24/15)   Time 6   Period Weeks   Status New   PT LONG TERM GOAL #3   Title ambulate > 300' with LRAD modified independent for improved function (10/24/15)   Time 6   Period Weeks   Status New   PT LONG TERM GOAL #4   Title report ability to stand/walk at least 20 min without increase in pain for improved function (10/24/15)   Time 6   Period Weeks   Status New               Plan - 09/15/15 1618    Clinical Impression Statement [name] reported numbness in legs  after standing probably coming from back She was not able to walk for 3 moin  after sitting and then she was fine to walk.  Her LT hip xray was reviewed and she apparently has significant OA and this is not likely to improve with PT causing pain and instabiltiy with walking. She will probably need RW for safety from now on due to numbness and OA in LT hip  and pain in  LT knee.          PT Next Visit Plan continue core and LE strength   Consulted and Agree with Plan of Care Patient        Problem List Patient Active Problem List   Diagnosis Date Noted  . Mild cognitive impairment with memory loss 09/02/2015  . Impaired functional mobility, balance, gait, and endurance 09/02/2015  . Hearing impairment 09/02/2015  . 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 Skin nodule 09/13/2014  . Osteopenia 08/30/2014  . Diffuse pain 04/07/2014  . Numbness in both legs 10/16/2012  . Constipation 06/04/2012  . Hypercholesteremia 09/05/2011  . Osteoarthritis 04/12/2011  . 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 Hypothyroidism 07/12/2009  . ANXIETY 07/12/2009  . INSOMNIA, PERSISTENT 07/12/2009  . Essential hypertension 07/12/2009  . CEREBROVASCULAR ACCIDENT, HX OF 07/12/2009    Darrel Hoover PT 09/15/2015, 6:20 PM  [facility] [address] [place], [place], [address] Phone: [phone]   Fax:  [phone]  Name: Yvonne [name] MRN: [id number] Date of Birth: 1941/03/30

## 2015-09-16 MED ORDER — LIDOCAINE-PRILOCAINE 2.5-2.5 % EX CREA: 1.0000 "application " | TOPICAL_CREAM | Freq: Three times a day (TID) | CUTANEOUS | Status: DC | PRN

## 2015-09-19 ENCOUNTER — Encounter: Payer: Self-pay | Admitting: Family Medicine

## 2015-09-19 ENCOUNTER — Ambulatory Visit (INDEPENDENT_AMBULATORY_CARE_PROVIDER_SITE_OTHER): Payer: Commercial Managed Care - HMO | Admitting: Family Medicine

## 2015-09-19 VITALS — BP 118/62 | HR 78 | Temp 98.2°F | Ht 62.0 in | Wt 149.0 lb

## 2015-09-19 DIAGNOSIS — M1612 Unilateral primary osteoarthritis, left hip: Secondary | ICD-10-CM

## 2015-09-19 DIAGNOSIS — G4700 Insomnia, unspecified: Secondary | ICD-10-CM

## 2015-09-19 MED ORDER — ZALEPLON 5 MG PO CAPS: 5.0000 mg | ORAL_CAPSULE | Freq: Every evening | ORAL | Status: DC | PRN

## 2015-09-19 MED ORDER — TRAMADOL HCL 50 MG PO TABS
50.0000 mg | ORAL_TABLET | Freq: Four times a day (QID) | ORAL | Status: DC | PRN
Start: 2015-09-19 — End: 2016-04-09

## 2015-09-19 NOTE — Assessment & Plan Note (Signed)
On chronic sonata per Kennon Rounds, takes nightly - would consider discontinuation or at least tapering given mild cognitive impairment, age and fall risk, will defer to PCP on this - refilled x1 month

## 2015-09-19 NOTE — Telephone Encounter (Signed)
Tried to reach patient regarding these orders.  I spoke with [facility] and they didn't see this order in their system for patient or even in their sister pharmacy's system.  Will try patient again to see where these scripts for the braces need to go. Jazmin Hartsell,CMA

## 2015-09-19 NOTE — Progress Notes (Signed)
   Subjective:   Yvonne [name] is a 75 y.o. female with a history of HTN, aortic stenosis, cognitive impairment, OA and CVA here for hip pain  EXTREMITY PAIN  Location: left hip Pain started: 2 months ago Pain is: achy, deep Severity: 6-10/10 Medications tried: naproxen, fish oil Recent trauma: no Similar pain previously: yes, in knee  Symptoms Redness:no Swelling:no Fever: no Weakness: no Weight loss: no Rash: no   Review of Symptoms - see HPI PMH - Smoking status noted.     Objective:  BP 118/62 mmHg  Pulse 78  Temp(Src) 98.2 F (36.8 C) (Oral)  Ht 5\' 2"  (1.575 m)  Wt 149 lb (67.586 kg)  BMI 27.25 kg/m2  SpO2 92%  Gen:  75 y.o. female in NAD HEENT: NCAT, MMM CV: RRR Resp: Non-labored MSK: decreased ROM d/t pain, strength intact, nontender Neuro: Alert and oriented, speech normal    Assessment & Plan:     Yvonne [name] is a 75 y.o. female here for hip pain  INSOMNIA, PERSISTENT On chronic sonata per Kennon Rounds, takes nightly - would consider discontinuation or at least tapering given mild cognitive impairment, age and fall risk, will defer to PCP on this - refilled x1 month  Osteoarthritis Severe L hip DJD, seen on xray and CT last month, wants hip replacement - discussed other management strategies, pt wants more permanent cure, pain meds carry significant risk for her given falls risk, CVD risk - will rec only low dose naproxen prn pain, discussed increased risk of bleeding and CVD events with this - rx tramadol prn, cautioned about infrequent use for this as well but risk is likely less than a higher dose nsaid for her - refer to ortho to discuss possible hip replacement, not a great candidate but is compliant with PT currently and will leave decision to them, cardiovascular health relatively good except for aortic stenosis    Beverlyn Roux, MD, MPH [facility] PGY-3 09/19/2015 12:08 PM

## 2015-09-19 NOTE — Assessment & Plan Note (Addendum)
Severe L hip DJD, seen on xray and CT last month, wants hip replacement - discussed other management strategies, pt wants more permanent cure, pain meds carry significant risk for her given falls risk, CVD risk - will rec only low dose naproxen prn pain, discussed increased risk of bleeding and CVD events with this - rx tramadol prn, cautioned about infrequent use for this as well but risk is likely less than a higher dose nsaid for her - refer to ortho to discuss possible hip replacement, not a great candidate but is compliant with PT currently and will leave decision to them, cardiovascular health relatively good except for aortic stenosis

## 2015-09-22 NOTE — Telephone Encounter (Signed)
Received fax from [facility] needing clarification in the direction of the lidocaine-prilocaine cream.  Should the directions state Apply up to 2 grams TID as needed for pain?  Current directions state: Apply 1 application topically 3 (three) times daily as needed.  Please advise.  Derl Barrow, RN

## 2015-09-26 ENCOUNTER — Ambulatory Visit: Payer: Commercial Managed Care - HMO | Attending: Family Medicine | Admitting: Physical Therapy

## 2015-09-26 DIAGNOSIS — R293 Abnormal posture: Secondary | ICD-10-CM | POA: Insufficient documentation

## 2015-09-26 DIAGNOSIS — M25552 Pain in left hip: Secondary | ICD-10-CM | POA: Insufficient documentation

## 2015-09-26 DIAGNOSIS — M79662 Pain in left lower leg: Secondary | ICD-10-CM | POA: Diagnosis not present

## 2015-09-26 DIAGNOSIS — M79661 Pain in right lower leg: Secondary | ICD-10-CM

## 2015-09-26 DIAGNOSIS — R262 Difficulty in walking, not elsewhere classified: Secondary | ICD-10-CM | POA: Insufficient documentation

## 2015-09-26 MED ORDER — LIDOCAINE-PRILOCAINE 2.5-2.5 % EX CREA: TOPICAL_CREAM | CUTANEOUS | Status: DC

## 2015-09-26 NOTE — Addendum Note (Signed)
Addended by: Derl Barrow on: 09/26/2015 08:39 AM   Modules accepted: Orders

## 2015-09-26 NOTE — Therapy (Signed)
[facility], [place], [address] Phone: [phone]   Fax:  [phone]  Physical Therapy Treatment  Patient Details  Name: Yvonne [name] MRN: [id number] Date of Birth: 1940-09-20 Referring Provider: Lissa Morales D (PCP: Darron Doom)  Encounter Date: 09/26/2015      PT End of Session - 09/26/15 1459    Visit Number 4   Number of Visits 12   Date for PT Re-Evaluation 10/24/15   PT Start Time [id number]   PT Stop Time 1458   PT Time Calculation (min) 40 min   Activity Tolerance Patient tolerated treatment well   Behavior During Therapy [facility] for tasks assessed/performed      Past Medical History  Diagnosis Date  . Allergy   . Hypertension   . Thyroid disease   . Stroke [facility])     pt was 40  . Mild cognitive impairment with memory loss 09/02/2015  . Impaired functional mobility, balance, gait, and endurance 09/02/2015    Past Surgical History  Procedure Laterality Date  . Abdominal hysterectomy    . Brain surgery    . 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      Subjective Assessment - 09/26/15 1426    Subjective "I am doing okay, I am able to walk for about 5 min then my feet get numb and then I fall"   Currently in Pain? No/denies                         [facility] Adult PT Treatment/Exercise - 09/26/15 1504    Self-Care   Self-Care Other Self-Care Comments   Other Self-Care Comments  proper mechanics with RW to avoid low back pain, walking inside the RW instead of letting it lead her   Knee/Hip Exercises: Stretches   Other Knee/Hip Stretches lower trunk rotation 3 x 10 each side   Knee/Hip Exercises: Standing   Gait Training 90 ft x 3 walking with RW   cues to not let RW lead her and to stand up straight   Knee/Hip Exercises: Seated   Sit to Sand 10 reps -- cues to tighten bottom to stand up straight cues to not  use the table   Knee/Hip Exercises: Supine   Bridges 2 X 10  cues for breathing,    Other Supine Knee/Hip Exercises Clams 2 x 10 with green theraband  cues to go slower                PT Education - 09/26/15 1459    Education provided Yes   Education Details proper gait/ posture with RW   Person(s) Educated Patient   Methods Explanation   Comprehension Verbalized understanding          PT Short Term Goals - 09/26/15 1502    PT SHORT TERM GOAL #1   Title independent with HEP (10/03/15)   Baseline not consistent   Time 3   Period Weeks   Status On-going           PT Long Term Goals - 09/13/15 0734    PT LONG TERM GOAL #1   Title improve timed up and go to < 13 sec for improved mobiilty (10/24/15)   Time 6   Period Weeks   Status New   PT LONG TERM GOAL #2   Title improve  gait velocity to > 2.0 ft/sec for improved mobility (10/24/15)   Time 6   Period Weeks   Status New   PT LONG TERM GOAL #3   Title ambulate > 300' with LRAD modified independent for improved function (10/24/15)   Time 6   Period Weeks   Status New   PT LONG TERM GOAL #4   Title report ability to stand/walk at least 20 min without increase in pain for improved function (10/24/15)   Time 6   Period Weeks   Status New               Plan - 09/26/15 1500    Clinical Impression Statement Ms Mcfail reported she is doing better today since the last visit. she reported not being consistent with her HEp due to losing her HEP, reprinted last HEP.  She continues to demonstrate limited endurance and strength in Bil LE.Fitted RW to pt and edcuated how walk with RW appropriatley. Educated on Sit to stand exercises with she uses the back of her kness to push off of the table for support. She required multiple verbal cues to not use the table.    Rehab Potential Fair   PT Frequency 2x / week   PT Duration 6 weeks   PT Treatment/Interventions ADLs/Self Care Home Management;Cryotherapy;Electrical  Stimulation;Moist Heat;Traction;Ultrasound;Patient/family education;Balance training;Neuromuscular re-education;Therapeutic exercise;Therapeutic activities;Functional mobility training;Gait training;Stair training;DME Instruction;Manual techniques   PT Next Visit Plan core and LE strengthening, gait with RW, updated ICD [id number] CERT   PT Home Exercise Plan HEP review   Consulted and Agree with Plan of Care Patient      Patient will benefit from skilled therapeutic intervention in order to improve the following deficits and impairments:  Abnormal gait, Decreased activity tolerance, Decreased balance, Decreased cognition, Decreased endurance, Decreased safety awareness, Pain, Decreased mobility, Postural dysfunction, Difficulty walking, Decreased knowledge of use of DME, Decreased strength  Visit Diagnosis: Pain in both lower legs - Plan: PT plan of care cert/re-cert  Difficulty in walking, not elsewhere classified - Plan: PT plan of care cert/re-cert  Abnormal posture - Plan: PT plan of care cert/re-cert     Problem List Patient Active Problem List   Diagnosis Date Noted  . Mild cognitive impairment with memory loss 09/02/2015  . Impaired functional mobility, balance, gait, and endurance 09/02/2015  . Hearing impairment 09/02/2015  . 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 Skin nodule 09/13/2014  . Osteopenia 08/30/2014  . Diffuse pain 04/07/2014  . Numbness in both legs 10/16/2012  . Constipation 06/04/2012  . Hypercholesteremia 09/05/2011  . Osteoarthritis 04/12/2011  . 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 Hypothyroidism 07/12/2009  . ANXIETY 07/12/2009  . INSOMNIA, PERSISTENT 07/12/2009  . Essential hypertension 07/12/2009  . CEREBROVASCULAR ACCIDENT, HX OF 07/12/2009   Starr Lake PT, DPT, LAT, ATC  09/26/2015  3:13 PM      [facility] [address] [place], [place], [address] Phone: [phone]    Fax:  [phone]  Name: Yvonne [name] MRN: [id number] Date of Birth: 08/20/1940

## 2015-09-27 NOTE — Telephone Encounter (Signed)
Patient is scheduled on 09-29-15 with pcp and will inquire about these braces then. Yvonne [name],CMA

## 2015-09-28 ENCOUNTER — Ambulatory Visit: Payer: Commercial Managed Care - HMO

## 2015-09-28 DIAGNOSIS — R262 Difficulty in walking, not elsewhere classified: Secondary | ICD-10-CM

## 2015-09-28 DIAGNOSIS — M25552 Pain in left hip: Secondary | ICD-10-CM | POA: Diagnosis not present

## 2015-09-28 DIAGNOSIS — M79662 Pain in left lower leg: Secondary | ICD-10-CM | POA: Diagnosis not present

## 2015-09-28 DIAGNOSIS — M79661 Pain in right lower leg: Secondary | ICD-10-CM | POA: Diagnosis not present

## 2015-09-28 DIAGNOSIS — R293 Abnormal posture: Secondary | ICD-10-CM

## 2015-09-28 NOTE — Therapy (Signed)
[place], [place], [address] Phone: [phone]   Fax:  [phone]  Physical Therapy Treatment  Patient Details  Name: Yvonne [name] MRN: [id number] Date of Birth: 1941-01-09 Referring Provider: Lissa Morales [name] (PCP: Yvonne [name])  Encounter Date: 09/28/2015      PT End of Session - 09/28/15 1245    Visit Number 5   Number of Visits 12   Date for PT Re-Evaluation 10/24/15   PT Start Time 1232   PT Stop Time 1312   PT Time Calculation (min) 40 min   Activity Tolerance Patient tolerated treatment well   Behavior During Therapy [facility] for tasks assessed/performed      Past Medical History  Diagnosis Date  . Allergy   . Hypertension   . Thyroid disease   . Stroke [facility])     pt was 40  . Mild cognitive impairment with memory loss 09/02/2015  . Impaired functional mobility, balance, gait, and endurance 09/02/2015    Past Surgical History  Procedure Laterality Date  . Abdominal hysterectomy    . Brain surgery    . 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      Subjective Assessment - 09/28/15 1239    Subjective Feels she is walking more. She will see surgeon to assess for LT hip OA. First hour of day pain is terrible but improves during day   Currently in Pain? No/denies                         [facility] Adult PT Treatment/Exercise - 09/28/15 1247    Knee/Hip Exercises: Aerobic   Nustep L5 LE only 7  min   Knee/Hip Exercises: Standing   Hip Flexion Right;Left;15 reps  RT and Lt   Hip Abduction Right;Left;15 reps  cued to not lift so high and decrease ER at hip   Abduction Limitations at walker   Hip Extension 10 reps;2 sets;Both   Extension Limitations Gluteal sets for exttension range   Other Standing Knee Exercises In step stand  TKE with quad set and hip extension with glute set with verbal and tactile cues x  15   Other Standing Knee Exercises Marching x 15 RT and Lt at walker   Knee/Hip Exercises: Seated   Long Arc Quad Strengthening;Right;Left;1 set;15 reps   Long Arc Quad Weight 4 lbs.   Sit to Sand 15 reps  verbal cues for chest up and glut squeeze to extend LT hip   Knee/Hip Exercises: Supine   Bridges Strengthening;Both;15 reps  cued for breathing   Bridges with [employer] Both;Strengthening  12 reps   Other Supine Knee/Hip Exercises Clams 1 x 15 ,1x10 with green theraband                  PT Short Term Goals - 09/26/15 1502    PT SHORT TERM GOAL #1   Title independent with HEP (10/03/15)   Baseline not consistent   Time 3   Period Weeks   Status On-going           PT Long Term Goals - 09/13/15 0734    PT LONG TERM GOAL #1   Title improve timed up and go to < 13 sec for improved mobiilty (10/24/15)   Time 6   Period Weeks   Status New  PT LONG TERM GOAL #2   Title improve gait velocity to > 2.0 ft/sec for improved mobility (10/24/15)   Time 6   Period Weeks   Status New   PT LONG TERM GOAL #3   Title ambulate > 300' with LRAD modified independent for improved function (10/24/15)   Time 6   Period Weeks   Status New   PT LONG TERM GOAL #4   Title report ability to stand/walk at least 20 min without increase in pain for improved function (10/24/15)   Time 6   Period Weeks   Status New               Plan - 09/28/15 1329    Clinical Impression Statement Yvonne [name] report she will se ortho MD for assessment of THA. She is able to do the excercises with verbal cuing as she has pain when she extends her LT knee and hip in standing and supine . It will be good to have ortho MD assess need and appropriateness of THA. She may not  be able to progress without this due to pain and decr alignment and stability on LT leg   PT Next Visit Plan core and LE strengthening,   PT Home Exercise Plan HEP review   Consulted and Agree with Plan of Care Patient       Patient will benefit from skilled therapeutic intervention in order to improve the following deficits and impairments:  Abnormal gait, Decreased activity tolerance, Decreased balance, Decreased cognition, Decreased endurance, Decreased safety awareness, Pain, Decreased mobility, Postural dysfunction, Difficulty walking, Decreased knowledge of use of DME, Decreased strength  Visit Diagnosis: Difficulty in walking, not elsewhere classified - Plan: PT plan of care cert/re-cert  Abnormal posture - Plan: PT plan of care cert/re-cert  Pain in left hip - Plan: PT plan of care cert/re-cert     Problem List Patient Active Problem List   Diagnosis Date Noted  . Mild cognitive impairment with memory loss 09/02/2015  . Impaired functional mobility, balance, gait, and endurance 09/02/2015  . Hearing impairment 09/02/2015  . 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 Skin nodule 09/13/2014  . Osteopenia 08/30/2014  . Diffuse pain 04/07/2014  . Numbness in both legs 10/16/2012  . Constipation 06/04/2012  . Hypercholesteremia 09/05/2011  . Osteoarthritis 04/12/2011  . 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 Hypothyroidism 07/12/2009  . ANXIETY 07/12/2009  . INSOMNIA, PERSISTENT 07/12/2009  . Essential hypertension 07/12/2009  . CEREBROVASCULAR ACCIDENT, HX OF 07/12/2009    Yvonne [name] PT 09/28/2015, 1:41 PM  [facility] [address] [place], [place], [address] Phone: [phone]   Fax:  [phone]  Name: Yvonne [name] MRN: [id number] Date of Birth: [birth year]

## 2015-09-29 ENCOUNTER — Other Ambulatory Visit: Payer: Self-pay | Admitting: *Deleted

## 2015-09-29 ENCOUNTER — Encounter: Payer: Self-pay | Admitting: Family Medicine

## 2015-09-29 ENCOUNTER — Ambulatory Visit (INDEPENDENT_AMBULATORY_CARE_PROVIDER_SITE_OTHER): Payer: Commercial Managed Care - HMO | Admitting: Family Medicine

## 2015-09-29 VITALS — Temp 98.7°F | Wt 151.2 lb

## 2015-09-29 DIAGNOSIS — M1612 Unilateral primary osteoarthritis, left hip: Secondary | ICD-10-CM

## 2015-09-29 DIAGNOSIS — G3184 Mild cognitive impairment, so stated: Secondary | ICD-10-CM

## 2015-09-29 DIAGNOSIS — I10 Essential (primary) hypertension: Secondary | ICD-10-CM

## 2015-09-29 MED ORDER — LEVOTHYROXINE SODIUM 75 MCG PO TABS: 75.0000 ug | ORAL_TABLET | Freq: Every day | ORAL | Status: DC

## 2015-09-29 MED ORDER — BENAZEPRIL-HYDROCHLOROTHIAZIDE 20-25 MG PO TABS: 1.0000 | ORAL_TABLET | Freq: Every day | ORAL | Status: DC

## 2015-09-29 MED ORDER — AMLODIPINE BESYLATE 5 MG PO TABS: 5.0000 mg | ORAL_TABLET | Freq: Every day | ORAL | Status: DC

## 2015-09-29 MED ORDER — ATORVASTATIN CALCIUM 10 MG PO TABS: 10.0000 mg | ORAL_TABLET | Freq: Every day | ORAL | Status: DC

## 2015-09-29 NOTE — Patient Instructions (Signed)

## 2015-09-29 NOTE — Progress Notes (Signed)
    Subjective:    Patient ID: Yvonne [name] is a 75 y.o. female presenting with Medical Clearance  on 09/29/2015  HPI: Has pain in her hip and knee on the left.  Doing PT 2x/wk. Seems to help as does the lidocaine for pain. Walks better. Is having foot numbness on both sides with prolonged standing. Pain is bad 5-6 times/day and improves with rest x 5 mins.  Review of Systems  Constitutional: Negative for fever and chills.  Respiratory: Negative for shortness of breath.   Cardiovascular: Negative for chest pain.  Gastrointestinal: Negative for nausea, vomiting and abdominal pain.  Genitourinary: Negative for dysuria.  Skin: Negative for rash.      Objective:    Temp(Src) 98.7 F (37.1 C) (Oral)  Wt 151 lb 3.2 oz (68.584 kg) Physical Exam  Constitutional: She is oriented to person, place, and time. She appears well-developed and well-nourished. No distress.  HENT:  Head: Normocephalic and atraumatic.  Eyes: No scleral icterus.  Neck: Neck supple.  Cardiovascular: Normal rate.   Pulmonary/Chest: Effort normal.  Abdominal: Soft.  Neurological: She is alert and oriented to person, place, and time.  Skin: Skin is warm and dry.  Psychiatric: She has a normal mood and affect.        Assessment & Plan:   Problem List Items Addressed This Visit      Unprioritized   Essential hypertension - Primary    Continue Norvasc and Lotensin HCT      Relevant Medications   atorvastatin (LIPITOR) 10 MG tablet   levothyroxine (SYNTHROID, LEVOTHROID) 75 MCG tablet   benazepril-hydrochlorthiazide (LOTENSIN HCT) 20-25 MG tablet   amLODipine (NORVASC) 5 MG tablet   Osteoarthritis    Severe in left hip.  Advised f/u with ortho--if considering major surgery--to see cards for clearance. Has AS and no ECHO since 2014. No stress test and h/o CVA with carotid occlusion.      Mild cognitive impairment with memory loss    Limit meds.         Total face-to-face time with patient: 15  minutes. Over 50% of encounter was spent on counseling and coordination of care. Return in about 3 months (around 12/29/2015) for a follow-up.  Liliana Dang S 09/29/2015 3:32 PM

## 2015-10-04 ENCOUNTER — Ambulatory Visit: Payer: Commercial Managed Care - HMO

## 2015-10-04 DIAGNOSIS — M25552 Pain in left hip: Secondary | ICD-10-CM

## 2015-10-04 DIAGNOSIS — R293 Abnormal posture: Secondary | ICD-10-CM | POA: Diagnosis not present

## 2015-10-04 DIAGNOSIS — M79662 Pain in left lower leg: Secondary | ICD-10-CM | POA: Diagnosis not present

## 2015-10-04 DIAGNOSIS — R262 Difficulty in walking, not elsewhere classified: Secondary | ICD-10-CM | POA: Diagnosis not present

## 2015-10-04 DIAGNOSIS — M79661 Pain in right lower leg: Secondary | ICD-10-CM | POA: Diagnosis not present

## 2015-10-04 NOTE — Assessment & Plan Note (Signed)
Severe in left hip.  Advised f/u with ortho--if considering major surgery--to see cards for clearance. Has AS and no ECHO since 2014. No stress test and h/o CVA with carotid occlusion.

## 2015-10-04 NOTE — Assessment & Plan Note (Signed)
Continue Norvasc and Lotensin HCT

## 2015-10-04 NOTE — Assessment & Plan Note (Signed)
Limit meds.

## 2015-10-04 NOTE — Therapy (Signed)
[place], [place], [address] Phone: [phone]   Fax:  [phone]  Physical Therapy Treatment  Patient Details  Name: Yvonne [name] MRN: [id number] Date of Birth: 11-16-1940 Referring Provider: Lissa Morales D (PCP: Darron Doom)  Encounter Date: 10/04/2015      PT End of Session - 10/04/15 1428    Visit Number 6  late 10 min   Number of Visits 12   Date for PT Re-Evaluation 10/24/15   Authorization Type [employer] medicare   Authorization Time Period KX at visit 15   PT Start Time 0225   PT Stop Time 0305   PT Time Calculation (min) 40 min   Activity Tolerance Patient tolerated treatment well   Behavior During Therapy [facility] for tasks assessed/performed      Past Medical History  Diagnosis Date  . Allergy   . Hypertension   . Thyroid disease   . Stroke [facility])     pt was 40  . Mild cognitive impairment with memory loss 09/02/2015  . Impaired functional mobility, balance, gait, and endurance 09/02/2015    Past Surgical History  Procedure Laterality Date  . Abdominal hysterectomy    . Brain surgery    . 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                       [place] Adult PT Treatment/Exercise - 10/04/15 1431    Knee/Hip Exercises: Stretches   Other Knee/Hip Stretches lower trunk rotation 3 x 10 each side with 15 sec hold , then LT hip flexor stretch in Thomas position 60 sec x1  with cues to press TL knee to bed   Knee/Hip Exercises: Aerobic   Nustep L5 LE only 7  min   Knee/Hip Exercises: Seated   Long Arc Quad Strengthening;Right;Left;20 reps;2 sets   [employer] Weight 5 lbs.   Long Arc Quad Limitations 3 sec hold   Knee/Hip Exercises: Supine   Bridges Strengthening;Both;15 reps   Other Supine Knee/Hip Exercises Mrching RT/LT leg x 20 each   Knee/Hip Exercises: Sidelying   Hip ABduction Left;15  reps   Hip ABduction Limitations she is unable yo lift more than 1 inch due to weakness. cued for alignment   Clams LT x15 short range and multiple cues to not compensate by rolling bacl followed by reverse clam     Sit to stand x 15 with cues for posture and hip abduction and flexion in standing x 15 and attempted hip extension passive leaning back against counter but had to stop due to pain in LT hip and knee             PT Short Term Goals - 09/26/15 1502    PT SHORT TERM GOAL #1   Title independent with HEP (10/03/15)   Baseline not consistent   Time 3   Period Weeks   Status On-going           PT Long Term Goals - 09/13/15 0734    PT LONG TERM GOAL #1   Title improve timed up and go to < 13 sec for improved mobiilty (10/24/15)   Time 6   Period Weeks   Status New   PT LONG TERM GOAL #2   Title improve gait velocity to > 2.0 ft/sec for improved mobility (10/24/15)  Time 6   Period Weeks   Status New   PT LONG TERM GOAL #3   Title ambulate > 300' with LRAD modified independent for improved function (10/24/15)   Time 6   Period Weeks   Status New   PT LONG TERM GOAL #4   Title report ability to stand/walk at least 20 min without increase in pain for improved function (10/24/15)   Time 6   Period Weeks   Status New               Plan - 10/04/15 1428    Clinical Impression Statement She appears unchanged  though she reports doing better. and continues with walker for safety. She will see MD ortho for assessment of LT hip. She will not make significant gains if she needs THA and we will address after  MD assessment.    PT Next Visit Plan core and LE strengthening, review HEP next visit.    Consulted and Agree with Plan of Care Patient      Patient will benefit from skilled therapeutic intervention in order to improve the following deficits and impairments:  Abnormal gait, Decreased activity tolerance, Decreased balance, Decreased cognition, Decreased  endurance, Decreased safety awareness, Pain, Decreased mobility, Postural dysfunction, Difficulty walking, Decreased knowledge of use of DME, Decreased strength  Visit Diagnosis: Difficulty in walking, not elsewhere classified  Abnormal posture  Pain in left hip     Problem List Patient Active Problem List   Diagnosis Date Noted  . Mild cognitive impairment with memory loss 09/02/2015  . Impaired functional mobility, balance, gait, and endurance 09/02/2015  . Hearing impairment 09/02/2015  . 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 Skin nodule 09/13/2014  . Osteopenia 08/30/2014  . Diffuse pain 04/07/2014  . Numbness in both legs 10/16/2012  . Constipation 06/04/2012  . Hypercholesteremia 09/05/2011  . Osteoarthritis 04/12/2011  . 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 Hypothyroidism 07/12/2009  . ANXIETY 07/12/2009  . INSOMNIA, PERSISTENT 07/12/2009  . Essential hypertension 07/12/2009  . CEREBROVASCULAR ACCIDENT, HX OF 07/12/2009    Darrel Hoover   PT 10/04/2015, 3:08 PM  [facility] [address] [place], [place], [address] Phone: [phone]   Fax:  [phone]  Name: DIM CARDI MRN: [id number] Date of Birth: December 23, 1940

## 2015-10-06 ENCOUNTER — Ambulatory Visit: Payer: Commercial Managed Care - HMO | Admitting: Physical Therapy

## 2015-10-06 DIAGNOSIS — M79662 Pain in left lower leg: Secondary | ICD-10-CM | POA: Diagnosis not present

## 2015-10-06 DIAGNOSIS — M25552 Pain in left hip: Secondary | ICD-10-CM | POA: Diagnosis not present

## 2015-10-06 DIAGNOSIS — R262 Difficulty in walking, not elsewhere classified: Secondary | ICD-10-CM

## 2015-10-06 DIAGNOSIS — M79661 Pain in right lower leg: Secondary | ICD-10-CM | POA: Diagnosis not present

## 2015-10-06 DIAGNOSIS — R293 Abnormal posture: Secondary | ICD-10-CM | POA: Diagnosis not present

## 2015-10-06 NOTE — Therapy (Addendum)
[place], [place], [address] Phone: [phone]   Fax:  [phone]  Physical Therapy Treatment  Patient Details  Name: Yvonne [name] MRN: [id number] Date of Birth: 12-21-1940 Referring Provider: Lissa Morales D (PCP: Darron Doom)  Encounter Date: 10/06/2015      PT End of Session - 10/06/15 1423    Visit Number 7   Number of Visits 12   Date for PT Re-Evaluation 10/24/15   Authorization Type [employer] medicare   PT Start Time 0217   PT Stop Time 0300   PT Time Calculation (min) 43 min      Past Medical History  Diagnosis Date  . Allergy   . Hypertension   . Thyroid disease   . Stroke [facility])     pt was 40  . Mild cognitive impairment with memory loss 09/02/2015  . Impaired functional mobility, balance, gait, and endurance 09/02/2015    Past Surgical History  Procedure Laterality Date  . Abdominal hysterectomy    . Brain surgery    . 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      Subjective Assessment - 10/06/15 1520    Subjective No pain right now. First 2 hours of the day is terrible    Currently in Pain? No/denies   Aggravating Factors  stand and walk   Pain Relieving Factors non weight bearing            [facility] PT Assessment - 10/06/15 0001    Ambulation/Gait   Ambulation/Gait Yes   Ambulation Distance (Feet) 250 Feet  in 2 minutes and 15 seconds.    Assistive device Rolling walker   Gait Pattern Decreased stance time - left;Decreased step length - right;Antalgic;Trunk flexed   Gait velocity 1.88f/sec                     [facility] Adult PT Treatment/Exercise - 10/06/15 0001    Knee/Hip Exercises: Aerobic   Nustep L5 LE only 5  min  pt request to stop at 5 min due to LE fatigue   Knee/Hip Exercises: Standing   Hip Flexion Right;Left;15 reps  RT and Lt   Hip Extension 10 reps;2 sets;Both    Knee/Hip Exercises: Seated   Sit to Sand 15 reps  verbal cues for chest up and glut squeeze to extend LT hip   Knee/Hip Exercises: Supine   Heel Slides 10 reps   Heel Slides Limitations left only   Bridges Strengthening;Both;15 reps   Straight Leg Raises 10 reps;Right   Straight Leg Raises Limitations left unable   Other Supine Knee/Hip Exercises Left march 10 x 3 each side   Other Supine Knee/Hip Exercises Clams with green band x 15 x 2                  PT Short Term Goals - 10/06/15 1447    PT SHORT TERM GOAL #1   Title independent with HEP (10/03/15)   Baseline not consistent   Time 3   Period Weeks   Status On-going           PT Long Term Goals - 10/06/15 1514    PT LONG TERM GOAL #1   Title improve timed up and go to < 13 sec for improved mobiilty (10/24/15)   Time 6   Period Weeks   Status On-going  PT LONG TERM GOAL #2   Title improve gait velocity to > 2.0 ft/sec for improved mobility (10/24/15)   Time 6   Period Weeks   Status On-going   PT LONG TERM GOAL #3   Title ambulate > 300' with LRAD modified independent for improved function (10/24/15)   Time 6   Period Weeks   Status On-going   PT LONG TERM GOAL #4   Title report ability to stand/walk at least 20 min without increase in pain for improved function (10/24/15)   Time 6   Period Weeks   Status On-going               Plan - 10/06/15 1502    Clinical Impression Statement Pt reports unable to stand / walk > 5 minutes prior to increased pain. TUG 20 sec with RW and 15 sec without RW and CGA, improved from 17.5 sec on eval. Pt able to ambulate with RW 250 ft  in 2 minutes and 15 seconds with a gait velocity of 1.85 ft/sec. Her pain increased to left hip at 5/10 and pt needed to sit. She is unable to leift left leg into SLR due to weakness. She reports minimal compliance with HEP. She plans to discuss options with a surgeon next week and hopes to not have hip replacement. Encourgaed pt to be  consistent with HEP to [facility] strength and function. She is progressing toward LTG# 1,2,3.    PT Next Visit Plan core and LE strengthening, review HEP next visit.       Patient will benefit from skilled therapeutic intervention in order to improve the following deficits and impairments:  Abnormal gait, Decreased activity tolerance, Decreased balance, Decreased cognition, Decreased endurance, Decreased safety awareness, Pain, Decreased mobility, Postural dysfunction, Difficulty walking, Decreased knowledge of use of DME, Decreased strength  Visit Diagnosis: Difficulty in walking, not elsewhere classified  Abnormal posture  Pain in left hip     Problem List Patient Active Problem List   Diagnosis Date Noted  . Mild cognitive impairment with memory loss 09/02/2015  . Impaired functional mobility, balance, gait, and endurance 09/02/2015  . Hearing impairment 09/02/2015  . 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 Skin nodule 09/13/2014  . Osteopenia 08/30/2014  . Diffuse pain 04/07/2014  . Numbness in both legs 10/16/2012  . Constipation 06/04/2012  . Hypercholesteremia 09/05/2011  . Osteoarthritis 04/12/2011  . 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 Hypothyroidism 07/12/2009  . ANXIETY 07/12/2009  . INSOMNIA, PERSISTENT 07/12/2009  . Essential hypertension 07/12/2009  . CEREBROVASCULAR ACCIDENT, HX OF 07/12/2009    Dorene Ar, PTA 10/06/2015, 3:23 PM  [facility] [address] [place], [place], [address] Phone: [phone]   Fax:  [phone]  Name: Yvonne [name] MRN: [id number] Date of Birth: November 24, 1940    PHYSICAL THERAPY DISCHARGE SUMMARY  Visits from Start of Care: 7  Current functional level related to goals / functional outcomes: See above   Remaining deficits: See above. She discharged self due to high copay for PT   Education / Equipment: HEP  Plan: Patient agrees to  discharge.  Patient goals were partially met. Patient is being discharged due to the patient's request.  ?????   Lillette Boxer. Chasse, PT               10/13/15                   12:22 PM

## 2015-10-10 DIAGNOSIS — M1612 Unilateral primary osteoarthritis, left hip: Secondary | ICD-10-CM | POA: Diagnosis not present

## 2015-10-11 ENCOUNTER — Ambulatory Visit: Payer: Commercial Managed Care - HMO

## 2015-10-13 ENCOUNTER — Ambulatory Visit: Payer: Commercial Managed Care - HMO

## 2015-10-20 ENCOUNTER — Ambulatory Visit (INDEPENDENT_AMBULATORY_CARE_PROVIDER_SITE_OTHER): Payer: Commercial Managed Care - HMO | Admitting: Family Medicine

## 2015-10-20 ENCOUNTER — Encounter: Payer: Self-pay | Admitting: Family Medicine

## 2015-10-20 VITALS — BP 110/70 | HR 79 | Temp 98.0°F | Ht 62.0 in | Wt 152.0 lb

## 2015-10-20 DIAGNOSIS — I10 Essential (primary) hypertension: Secondary | ICD-10-CM

## 2015-10-20 DIAGNOSIS — M1612 Unilateral primary osteoarthritis, left hip: Secondary | ICD-10-CM | POA: Diagnosis not present

## 2015-10-20 DIAGNOSIS — R208 Other disturbances of skin sensation: Secondary | ICD-10-CM | POA: Diagnosis not present

## 2015-10-20 DIAGNOSIS — M792 Neuralgia and neuritis, unspecified: Secondary | ICD-10-CM | POA: Diagnosis not present

## 2015-10-20 DIAGNOSIS — R200 Anesthesia of skin: Secondary | ICD-10-CM

## 2015-10-20 LAB — POCT GLYCOSYLATED HEMOGLOBIN (HGB A1C): HEMOGLOBIN A1C: 5.9

## 2015-10-20 LAB — VITAMIN B12: Vitamin B-12: 923 pg/mL (ref 200–1100)

## 2015-10-20 NOTE — Progress Notes (Signed)
    Subjective:    Patient ID: Yvonne [name] is a 75 y.o. female presenting with Follow-up  on 10/20/2015  HPI: Worried she has diabetes. Reports pain in feet and has improved with osteoflex. Is needing walker. Reports her feet get numb.  Saw orthopedics and they have said she will eventually need left hip replacement. But would like an eval for diabetes. Patient does not seem to want to proceed with hip arthroplasty if possible.   Review of Systems  Constitutional: Negative for fever and chills.  Respiratory: Negative for shortness of breath.   Cardiovascular: Negative for chest pain.  Gastrointestinal: Negative for nausea, vomiting and abdominal pain.  Endocrine: Positive for polydipsia and polyuria.  Genitourinary: Negative for dysuria.  Skin: Negative for rash.      Objective:    BP 110/70 mmHg  Pulse 79  Temp(Src) 98 F (36.7 C)  Ht 5\' 2"  (1.575 m)  Wt 152 lb (68.947 kg)  BMI 27.79 kg/m2  SpO2 93% Physical Exam  Constitutional: She is oriented to person, place, and time. She appears well-developed and well-nourished. No distress.  HENT:  Head: Normocephalic and atraumatic.  Eyes: No scleral icterus.  Neck: Neck supple.  Cardiovascular: Normal rate.   Murmur (3/6 holosystolic) heard. Pulmonary/Chest: Effort normal.  Abdominal: Soft.  Neurological: She is alert and oriented to person, place, and time.  Skin: Skin is warm and dry.  Psychiatric: She has a normal mood and affect.        Assessment & Plan:   Problem List Items Addressed This Visit      Unprioritized   Essential hypertension    BP is at goal. Continue Lotensin HCTZ and Amlodipine      Osteoarthritis    Further treatment and recs per orthopedics.      Numbness in both legs    R/o B12 and diabetic neuropathy.       Other Visit Diagnoses    Neuropathic pain    -  Primary    Relevant Orders    B12 (Completed)    POCT HgB A1C (CPT 83036) (Completed)       Total face-to-face time with  patient: 15 minutes. Over 50% of encounter was spent on counseling and coordination of care. Return in about 3 months (around 01/20/2016).  Yvonne [name] 10/20/2015 4:43 PM

## 2015-10-20 NOTE — Patient Instructions (Signed)

## 2015-10-21 ENCOUNTER — Telehealth: Payer: Self-pay | Admitting: *Deleted

## 2015-10-21 NOTE — Assessment & Plan Note (Signed)
BP is at goal. Continue Lotensin HCTZ and Amlodipine

## 2015-10-21 NOTE — Telephone Encounter (Signed)
LM for patient on identified VM with results. Jazmin Hartsell,CMA

## 2015-10-21 NOTE — Telephone Encounter (Signed)
-----   Message from Donnamae Jude, MD sent at 10/21/2015  8:24 AM EDT ----- She is not diabetic

## 2015-10-21 NOTE — Assessment & Plan Note (Signed)
Further treatment and recs per orthopedics.

## 2015-10-21 NOTE — Assessment & Plan Note (Signed)
R/o B12 and diabetic neuropathy.

## 2015-11-07 ENCOUNTER — Encounter: Payer: Self-pay | Admitting: Family Medicine

## 2015-11-07 ENCOUNTER — Ambulatory Visit (INDEPENDENT_AMBULATORY_CARE_PROVIDER_SITE_OTHER): Payer: Commercial Managed Care - HMO | Admitting: Family Medicine

## 2015-11-07 DIAGNOSIS — R208 Other disturbances of skin sensation: Secondary | ICD-10-CM

## 2015-11-07 DIAGNOSIS — R200 Anesthesia of skin: Secondary | ICD-10-CM

## 2015-11-07 NOTE — Patient Instructions (Signed)

## 2015-11-07 NOTE — Progress Notes (Signed)
    Subjective:    Patient ID: Yvonne [name] is a 75 y.o. female presenting with Follow-up  on 11/07/2015  HPI: Continues to complain of bilateral leg numbness and pain with standing or ambulating. She has normal strength. Pain occurs at 2-3 minutes and causes her to need to sit down for 2-3 minutes, and then pain is better. She notes falling from this. She has had normal tests for diabetes and B12. 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  Review of Systems  Constitutional: Negative for fever and chills.  Respiratory: Negative for shortness of breath.   Cardiovascular: Negative for chest pain.  Gastrointestinal: Negative for nausea, vomiting and abdominal pain.  Genitourinary: Negative for dysuria.  Skin: Negative for rash.      Objective:    BP 135/68 mmHg  Pulse 96  Temp(Src) 97.8 F (36.6 C) (Oral)  Ht 5\' 1"  (1.549 m)  Wt 148 lb (67.132 kg)  BMI 27.98 kg/m2  SpO2 95% Physical Exam  Constitutional: She is oriented to person, place, and time. She appears well-developed and well-nourished.  HENT:  Head: Normocephalic and atraumatic.  Neck: Normal range of motion. No thyromegaly present.  Cardiovascular: Normal rate, regular rhythm and intact distal pulses.   Pulses:      Dorsalis pedis pulses are 2+ on the right side, and 2+ on the left side.  Pulmonary/Chest: Effort normal and breath sounds normal.  Abdominal: Soft. She exhibits no distension. There is no tenderness.  Musculoskeletal: She exhibits no edema or tenderness.  Neurological: She is alert and oriented to person, place, and time. She has normal strength. She displays no atrophy. No sensory deficit. She exhibits normal muscle tone.  Skin: Skin is warm and dry. No rash noted.        Assessment & Plan:   Problem List Items Addressed This Visit      Unprioritized   Numbness in both legs    Neurogenic claudication?--related to spinal stenosis--will  check MRI of lumbar region.      Relevant Orders   MR Lumbar Spine W Contrast      Total face-to-face time with patient: 15 minutes. Over 50% of encounter was spent on counseling and coordination of care. Return in about 3 months (around 02/07/2016) for a follow-up.  PRATT,TANYA S 11/07/2015 2:45 PM

## 2015-11-07 NOTE — Assessment & Plan Note (Signed)
Neurogenic claudication?--related to spinal stenosis--will check MRI of lumbar region.

## 2015-11-17 ENCOUNTER — Other Ambulatory Visit: Payer: Self-pay | Admitting: Family Medicine

## 2015-11-17 ENCOUNTER — Ambulatory Visit (HOSPITAL_COMMUNITY)
Admission: RE | Admit: 2015-11-17 | Discharge: 2015-11-17 | Disposition: A | Discharge status: Home / Self Care | Payer: Commercial Managed Care - HMO | Source: Ambulatory Visit | Attending: Family Medicine | Admitting: Family Medicine

## 2015-11-17 DIAGNOSIS — R200 Anesthesia of skin: Secondary | ICD-10-CM | POA: Diagnosis not present

## 2015-11-17 LAB — CREATININE, SERUM
CREATININE: 0.7 mg/dL (ref 0.44–1.00)
GFR calc Af Amer: >60 mL/min (ref >60)

## 2015-11-17 NOTE — Progress Notes (Signed)
PER DR CURNES HE WOULD LIKE PT TO GET CT HEAD IN ORDER TO SEE WHAT WAS ACTUALLY PLACED IN PT'S HEAD, CONTACTED MD AND SHE WANTED PT TO HAVE AN XRAY, PER DR CURNES AN XRAY WILL NOT SHOW ENOUGH TO CLEAR PT, XRAY HAS SOME NEGATIVES, MD WAS PAGED AGAIN AND SHE NEVER CALLED BACK ON SECOND ATTEMPT, PT DECIDED TO LEAVE AFTER WAITING TO LONG, [facility] WAS ALSO CALLED TO SEE IF PT NEEDED PRE-CERTIFICATION FOR CT AND SHE DOES

## 2015-11-24 ENCOUNTER — Telehealth: Payer: Self-pay | Admitting: *Deleted

## 2015-11-24 NOTE — Telephone Encounter (Signed)
Received faxed refill request from [facility] (Ph [phone] Fax [phone]) for Omega 3-Acid Ethyl Esters 1 g cap  Take 2 caps PO BID #360 with 3 refills Unable to reorder Omega 3 listed on current med list Luria Rosario, Orvis Brill, RN

## 2015-11-25 MED ORDER — OMEGA-3-ACID ETHYL ESTERS 1 G PO CAPS: 1.0000 g | ORAL_CAPSULE | Freq: Two times a day (BID) | ORAL | Status: DC

## 2015-12-12 ENCOUNTER — Encounter: Payer: Self-pay | Admitting: Cardiovascular Disease

## 2015-12-12 ENCOUNTER — Ambulatory Visit (INDEPENDENT_AMBULATORY_CARE_PROVIDER_SITE_OTHER): Payer: Commercial Managed Care - HMO | Admitting: Cardiovascular Disease

## 2015-12-12 VITALS — BP 142/80 | HR 88 | Ht 61.0 in | Wt 147.8 lb

## 2015-12-12 DIAGNOSIS — I10 Essential (primary) hypertension: Secondary | ICD-10-CM | POA: Diagnosis not present

## 2015-12-12 DIAGNOSIS — E785 Hyperlipidemia, unspecified: Secondary | ICD-10-CM

## 2015-12-12 NOTE — Progress Notes (Signed)
Yvonne [name] Date of Birth  [birth year] [place] [address]    [place]   [place], [place]  [address]    [place],   [address] [phone]  Fax  [phone]  [phone]  Fax [phone]   Problem List: 1. Mild AS / AI  2. Hypothyroidism 3. Hypertension 4. Toe amputation for hammer toe 5. History of stroke- occluded left carotid, mild right carotid disease 6. CVA - age 75   History of Present Illness:  Pt is a 75 y.o. female with a hx of stroke at age 46.  She continues to have problems with her right arm.  She has had a heart murmur for years.  She has not had any recent problems.  She has a hx of HTN.  She admits to eating extra salt on occasion.  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 This corresponds with her previous stroke. There was no significant disease on right carotid. An echocardiogram revealed normal left ventricular systolic function.  She has mild aortic stenosis and aortic insufficiency which explains her heart murmur.  She eats lots of salty meals.  She eats prepared lunch and dinners every day.  She eats popcorn every night.    December 08, 2014:    Yvonne [name] is seen back after a 3 year absence.  Still eats some salt .  She is doing well.  She fell the other day.   misstepped on the cement.    December 12, 2015:  Doing well. Is now walking with a walker  Doing well from a cardiac standpoint .  Current Outpatient Prescriptions on File Prior to Visit  Medication Sig Dispense Refill  . amitriptyline (ELAVIL) 50 MG tablet Take 50 mg by mouth at bedtime. Take 3 tablets by mouth at bedtime    . amLODipine (NORVASC) 5 MG tablet Take 1 tablet (5 mg total) by mouth daily. 90 tablet 3  . aspirin 81 MG EC tablet Take 1 tablet (81 mg total) by mouth daily. 30 tablet 3  . atorvastatin (LIPITOR) 10 MG tablet Take 1 tablet (10 mg total) by mouth daily.  90 tablet 3  . benazepril-hydrochlorthiazide (LOTENSIN HCT) 20-25 MG tablet Take 1 tablet by mouth daily. 90 tablet 3  . Elastic Bandages & Supports (LUMBAR BACK BRACE/SUPPORT PAD) MISC 1 Units by Does not apply route as needed. 1 each 0  . Elastic Bandages & Supports (SLIP-ON KNEE BRACE) MISC 1 Units by Does not apply route as needed. 1 each 0  . levothyroxine (SYNTHROID, LEVOTHROID) 75 MCG tablet Take 1 tablet (75 mcg total) by mouth daily. 90 tablet 3  . lidocaine-prilocaine (EMLA) cream Apply up to 2 grams three times a day as needed for pain. 720 g 3  . Misc Natural Products (OSTEO BI-FLEX ADV TRIPLE ST) TABS Take by mouth.    . Multiple Vitamin (MULTIVITAMIN) tablet Take 1 tablet by mouth daily.      Marland Kitchen omega-3 acid ethyl esters (LOVAZA) 1 g capsule Take 1 capsule (1 g total) by mouth 2 (two) times daily. 180 capsule 3  . traMADol (ULTRAM) 50 MG tablet Take 1 tablet (50 mg total) by mouth every 6 (six) hours as needed for severe pain. 50 tablet 2   No current facility-administered medications on file prior to visit.    No Known Allergies  Past Medical History  Diagnosis Date  .  Allergy   . Hypertension   . Thyroid disease   . Stroke [facility])     pt was 40  . Mild cognitive impairment with memory loss 09/02/2015  . Impaired functional mobility, balance, gait, and endurance 09/02/2015    Past Surgical History  Procedure Laterality Date  . Abdominal hysterectomy    . Brain surgery    . Toe amputation  2013    2nd toe on right foot, hammer toe    History  Smoking status  . Never Smoker   Smokeless tobacco  . Never Used    History  Alcohol Use No    Family History  Problem Relation Age of Onset  . Thyroid disease Mother   . Heart disease Father     Reviw of Systems:  Reviewed in the HPI.  All other systems are negative.  Physical Exam: Blood pressure 142/80, pulse 88, height 5\' 1"  (1.549 m), weight 147 lb 12.8 oz (67.042 kg). General: Well developed, well  nourished, in no acute distress.  Head: Normocephalic, atraumatic, sclera non-icteric, mucus membranes are moist,   Neck: Supple.  JVD not elevated.  Lungs: Clear bilaterally to auscultation without wheezes, rales, or rhonchi. Breathing is unlabored.  Heart: RRR with S1 S2. There is a 2/6 systolic murmur at the left sternal border.  Abdomen: Soft, non-tender, non-distended with normoactive bowel sounds. No hepatomegaly. No rebound/guarding. No obvious abdominal masses.  Msk:  Strength and tone appear normal for age.  Extremities: No clubbing or cyanosis. No edema.  Distal pedal pulses are 2+ and equal bilaterally.  Neuro: Alert and oriented X 3. Moves all extremities spontaneously.  Psych:  Responds to questions appropriately with a normal affect.  ECG: 12/12/2015: Normal sinus rhythm at 88. 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  Assessment / Plan:   1. Mild AS / AI  - benign.  No symptoms .     2. Hypothyroidism - followed by family practice.   3. Hypertension - BP is fairly well controlled.  Advised her to avoid salt  4. Toe amputation for hammer toe  5. 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  Suggested repeat study in 2 years     Mertie Moores, MD  12/12/2015 3:09 PM    [place],  [place], [place]  [address] Pager [phone] Phone: [phone]; Fax: [phone]   [facility]  [address] [place], [place]  [address] [phone]    Fax [phone]

## 2015-12-12 NOTE — Patient Instructions (Signed)

## 2016-01-05 ENCOUNTER — Other Ambulatory Visit: Payer: Self-pay | Admitting: Family Medicine

## 2016-01-18 DIAGNOSIS — M79605 Pain in left leg: Secondary | ICD-10-CM | POA: Diagnosis not present

## 2016-01-18 DIAGNOSIS — M79604 Pain in right leg: Secondary | ICD-10-CM | POA: Diagnosis not present

## 2016-01-24 ENCOUNTER — Telehealth: Payer: Self-pay | Admitting: Family Medicine

## 2016-01-24 DIAGNOSIS — R200 Anesthesia of skin: Secondary | ICD-10-CM

## 2016-01-24 NOTE — Telephone Encounter (Signed)
Will forward to MD. Tollie Canada,CMA  

## 2016-01-24 NOTE — Telephone Encounter (Signed)
It is done, what happened to her MRI?

## 2016-01-24 NOTE — Telephone Encounter (Signed)
It looks like she was scheduled in June for this scan but it was cancelled.  She has rescheduled it yet. Jazmin Hartsell,CMA

## 2016-01-24 NOTE — Telephone Encounter (Signed)
Yvonne [name] from [facility] stated they need a referral for the pt.Pt's first appointment is tomorrow at 2pm, they need the referral before her appointment time. Please advise. Thanks! ep

## 2016-01-26 NOTE — Telephone Encounter (Signed)
LM for Elmyra Ricks with [employer] and asked her to call back with NPI of provider patient is seeing as well as the diagnosis code that they are using.  Will leave referral information with Darl Householder. So it can be placed online with [employer]. Liliah Dorian,CMA

## 2016-01-26 NOTE — Telephone Encounter (Signed)
[employer] is calling again for the referral for the patient Please call [phone] with any questions. jw

## 2016-01-31 DIAGNOSIS — M170 Bilateral primary osteoarthritis of knee: Secondary | ICD-10-CM | POA: Diagnosis not present

## 2016-01-31 DIAGNOSIS — M25561 Pain in right knee: Secondary | ICD-10-CM | POA: Diagnosis not present

## 2016-01-31 DIAGNOSIS — R262 Difficulty in walking, not elsewhere classified: Secondary | ICD-10-CM | POA: Diagnosis not present

## 2016-01-31 DIAGNOSIS — M25562 Pain in left knee: Secondary | ICD-10-CM | POA: Diagnosis not present

## 2016-02-14 DIAGNOSIS — R262 Difficulty in walking, not elsewhere classified: Secondary | ICD-10-CM | POA: Diagnosis not present

## 2016-02-14 DIAGNOSIS — M1712 Unilateral primary osteoarthritis, left knee: Secondary | ICD-10-CM | POA: Diagnosis not present

## 2016-02-14 DIAGNOSIS — M25562 Pain in left knee: Secondary | ICD-10-CM | POA: Diagnosis not present

## 2016-02-22 DIAGNOSIS — M25562 Pain in left knee: Secondary | ICD-10-CM | POA: Diagnosis not present

## 2016-02-22 DIAGNOSIS — M1712 Unilateral primary osteoarthritis, left knee: Secondary | ICD-10-CM | POA: Diagnosis not present

## 2016-02-28 ENCOUNTER — Encounter: Payer: Self-pay | Admitting: Physical Therapy

## 2016-02-28 ENCOUNTER — Ambulatory Visit: Payer: Commercial Managed Care - HMO | Attending: Family Medicine | Admitting: Physical Therapy

## 2016-02-28 DIAGNOSIS — M79604 Pain in right leg: Secondary | ICD-10-CM | POA: Diagnosis not present

## 2016-02-28 DIAGNOSIS — M6281 Muscle weakness (generalized): Secondary | ICD-10-CM | POA: Diagnosis not present

## 2016-02-28 DIAGNOSIS — R296 Repeated falls: Secondary | ICD-10-CM | POA: Diagnosis not present

## 2016-02-28 DIAGNOSIS — R293 Abnormal posture: Secondary | ICD-10-CM | POA: Diagnosis not present

## 2016-02-28 DIAGNOSIS — R2681 Unsteadiness on feet: Secondary | ICD-10-CM

## 2016-02-28 DIAGNOSIS — R269 Unspecified abnormalities of gait and mobility: Secondary | ICD-10-CM | POA: Diagnosis not present

## 2016-02-28 DIAGNOSIS — R2689 Other abnormalities of gait and mobility: Secondary | ICD-10-CM | POA: Diagnosis not present

## 2016-02-28 DIAGNOSIS — M79605 Pain in left leg: Secondary | ICD-10-CM | POA: Diagnosis not present

## 2016-02-28 NOTE — Therapy (Signed)
[facility] [address] [place], [place], [address] Phone: [phone]   Fax:  [phone]  Physical Therapy Evaluation  Patient Details  Name: Yvonne [name] C Halle MRN: [id number] Date of Birth: Apr 13, 1941 Referring Provider: Tinnie Gensanya Pratt, MD  Encounter Date: 02/28/2016      PT End of Session - 02/28/16 1732    Visit Number 1   Number of Visits 17   Date for PT Re-Evaluation 04/27/16   Authorization Type Medicare G Code and Progress Note   PT Start Time 1315   PT Stop Time 1405   PT Time Calculation (min) 50 min   Equipment Utilized During Treatment Gait belt   Activity Tolerance Patient tolerated treatment well;Patient limited by pain;Patient limited by fatigue   Behavior [facility] Therapy [facility] for tasks assessed/performed      Past Medical History:  Diagnosis Date  . Allergy   . Hypertension   . Impaired functional mobility, balance, gait, and endurance 09/02/2015  . Mild cognitive impairment with memory loss 09/02/2015  . Stroke [facility](HCC)    pt was 40  . Thyroid disease     Past Surgical History:  Procedure Laterality Date  . ABDOMINAL HYSTERECTOMY    . BRAIN SURGERY    . TOE AMPUTATION  2013   2nd toe on right foot, hammer toe    There were no vitals filed for this visit.       Subjective Assessment - 02/28/16 1322    Subjective Pt reports having improved pain with flexogenics stating that she was in a walker 2 days ago and is now able to walk on her own.     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 No/denies  L hip 3-4/10 before injection   Pain Location Hip   Pain Orientation Left   Aggravating Factors  standing, wlaking   Pain Relieving Factors sitting for 5-10 min, medication   Multiple  Pain Sites Yes   Pain Score 0  3-4/10 when standing   Pain Location Leg   Pain Orientation Right   Aggravating Factors  standing, walking   Pain Relieving Factors sitting 5-10 min, medications            [facility] PT Assessment - 02/28/16 1315      Assessment   Medical Diagnosis Bilat LE numbness   Referring Provider Tinnie Gensanya Pratt, MD   Onset Date/Surgical Date 01/24/16  MD referral   Hand Dominance Right     Precautions   Precautions Fall     Balance Screen   Has the patient fallen in the past 6 months Yes   How many times? "Once every couple of days"   Has the patient had a decrease in activity level because of a fear of falling?  Yes   Is the patient reluctant to leave their home because of a fear of falling?  No     Home Environment   Living Environment Private residence   Living Arrangements Alone   Type of Home House   Home Access Stairs to enter   Entrance Stairs-Number of Steps 6-7   Entrance Stairs-Rails Right;Left;Cannot reach both   Home Layout One level   Home Equipment Walker - 4 wheels;Walker - standard;Grab bars - tub/shower     Prior Function  Level of Independence Independent with basic ADLs;Independent with gait   Vocation Retired     [employer]   Overall Cognitive Status --     Posture/Postural Control   Posture/Postural Control Postural limitations   Postural Limitations Rounded Shoulders;Forward head;Increased thoracic kyphosis;Flexed trunk;Weight shift left     ROM / Strength   AROM / PROM / Strength AROM;Strength     AROM   Overall AROM  Within functional limits for tasks performed     Strength   Right/Left Shoulder Right;Left   Right Shoulder Flexion 4/5   Right Shoulder ABduction 3-/5   Left Shoulder Flexion 4/5   Left Shoulder ABduction 3+/5   Right/Left Hip Right;Left   Right Hip Flexion 3+/5   Right Hip Extension 3-/5  tested in standing with BUE support   Right Hip ABduction 3-/5  tested in standing with BUE support   Left  Hip Flexion 3-/5   Left Hip Extension 3-/5  tested in standing with BUE support   Left Hip ABduction 2+/5  tested in standing with BUE support   Right/Left Knee Right;Left   Right Knee Flexion 4/5   Right Knee Extension 4/5   Left Knee Flexion 3+/5   Left Knee Extension 4/5   Right/Left Ankle Right;Left   Right Ankle Dorsiflexion 4/5   Left Ankle Dorsiflexion 3+/5     Ambulation/Gait   Ambulation/Gait Yes   Ambulation/Gait Assistance 5: Supervision   Ambulation Distance (Feet) 100 Feet   Assistive device None   Gait Pattern Decreased step length - right;Decreased stride length;Decreased stance time - left;Decreased hip/knee flexion - left;Left foot flat;Left flexed knee in stance;Antalgic;Lateral trunk lean to left;Wide base of support;Poor foot clearance - left  Excessive bilat arm swing at elbow   Ambulation Surface Level;Indoor   Gait velocity 1.51 ft/sec  less than 1.43ft/sec indicates fall risk     Standardized Balance Assessment   Standardized Balance Assessment Berg Balance Test;Timed Up and Go Test;Five Times Sit to Stand     Berg Balance Test   Sit to Stand Able to stand without using hands and stabilize independently   Standing Unsupported Able to stand safely 2 minutes   Sitting with Back Unsupported but Feet Supported on Floor or Stool Able to sit safely and securely 2 minutes   Stand to Sit Sits safely with minimal use of hands   Transfers Able to transfer safely, minor use of hands   Standing Unsupported with Eyes Closed Able to stand 10 seconds safely   Standing Ubsupported with Feet Together Able to place feet together independently and stand for 1 minute with supervision   From Standing, Reach Forward with Outstretched Arm Reaches forward but needs supervision   From Standing Position, Pick up Object from [place] to pick up shoe, needs supervision   From Standing Position, Turn to Look Behind Over each Shoulder Needs supervision when turning   Turn 360  Degrees Needs close supervision or verbal cueing   Standing Unsupported, Alternately Place Feet on Step/Stool Needs assistance to keep from falling or unable to try   Standing Unsupported, One Foot in [employer] balance while stepping or standing   Standing on One Leg Unable to try or needs assist to prevent fall   Total Score 33     Timed Up and Go Test   Normal TUG (seconds) 22.47  no device   Cognitive TUG (seconds) 26.09  unable to continue cognitive task while walking   TUG Comments 16% incr from standard  TUG                           PT Education - 02/28/16 1718    Education provided Yes   Education Details Pt educated           PT Short Term Goals - 02/28/16 1806      PT SHORT TERM GOAL #1   Title Pt will be independent with intial HEP to address balance, flexibility, and weakness. (Target Date: 03/30/16)   Time 4   Period Weeks   Status New     PT SHORT TERM GOAL #2   Title Pt will be able to safely reach 4" without balance loss. (Target Date: 03/30/16)   Time 4   Period Weeks   Status New     PT SHORT TERM GOAL #3   Title Pt will be able to safely ambulate 150 ft with rollator. (Target Date: 03/30/16)   Time 4   Period Weeks   Status New     PT SHORT TERM GOAL #4   Title Pt verbalizes an understanding of modifying activity level including AD's for pain management. (Target Date: 03/30/16)   Time 4   Period Weeks   Status New           PT Long Term Goals - 02/28/16 1756      PT LONG TERM GOAL #1   Title Pt verbalizes and demonstrates understanding of ongoing HEP/fitness plan (Target Date: 04/27/16   Time 8   Period Weeks   Status New     PT LONG TERM GOAL #2   Title Pt reports pain increases less than 2 increments on the 0-10 scale with standing and gait activites. (Target Date: 04/27/16)   Time 8   Period Weeks   Status New     PT LONG TERM GOAL #3   Title Pt will improve BERG Balance to > 40/56 to indicate lower fall  risk. (Target Date:04/27/16)   Time 8   Period Weeks   Status New     PT LONG TERM GOAL #4   Title Pt will improve TUG score to < 16.5 seconds safely to indicate lower fall risk. (Target Date: 04/27/16)   Time 8   Period Weeks   Status New     PT LONG TERM GOAL #5   Title Pt will ambulate 300 ft including negotiating ramps and curbs with LRAD to enable community mobility. (Target Date: 04/27/16)   Time 8   Period Weeks   Status New     Additional Long Term Goals   Additional Long Term Goals Yes     PT LONG TERM GOAL #6   Title Pt will be able to navigate 112ft around furniture without an AD mod I to enable household mobility. (Target Date: 04/27/16)   Time 8   Period Weeks   Status New               Plan - 02/28/16 1735    Clinical Impression Statement Pt is a 75 year old female who presents to PT with a diagnosis of bilat numbess and weakness in the LEs and a significant history of falls.  She demonstrates weakness in bilat LEs and decr in balance as indicated by a Berg Balance score of 33/56, Standard TUG of 22.37 secs, and Cognitive TUG of 26.09.  All scores indicate a high risk for falls.  Pt also presents with pain in B  LEs that prevents her from walking long distances such as going out to eat and perfoming ADLs at home.  Pt has abnormal posture having a negative impact on her balance.  Pt's condition appears to be evolving and moderate plan of care.  Pt would benefit from skilled PT to address her deficits.   Rehab Potential Good   Clinical Impairments Affecting Rehab Potential Hard of hearing, cognitive deficits, R eye blindness   PT Frequency 2x / week   PT Duration 8 weeks   PT Treatment/Interventions ADLs/Self Care Home Management;Gait training;Stair training;Functional mobility training;Neuromuscular re-education;Balance training;Therapeutic exercise;Therapeutic activities;Manual techniques;Patient/family education;Orthotic Fit/Training;Visual/perceptual  remediation/compensation;Energy conservation   PT Next Visit Plan initiate HEP with OTEGO, instruct in fall prevention strategies   Consulted and Agree with Plan of Care Patient      Patient will benefit from skilled therapeutic intervention in order to improve the following deficits and impairments:  Abnormal gait, Decreased activity tolerance, Decreased balance, Decreased cognition, Decreased mobility, Decreased endurance, Decreased range of motion, Decreased knowledge of use of DME, Decreased strength, Impaired sensation, Impaired flexibility, Postural dysfunction, Impaired vision/preception, Pain  Visit Diagnosis: Unsteadiness on feet  Other abnormalities of gait and mobility  Muscle weakness (generalized)  Abnormal posture  Repeated falls  Pain In Right Leg  Pain In Left Leg     Problem List Patient Active Problem List   Diagnosis Date Noted  . Mild cognitive impairment with memory loss 09/02/2015  . Impaired functional mobility, balance, gait, and endurance 09/02/2015  . Hearing impairment 09/02/2015  . 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 Skin nodule 09/13/2014  . Osteopenia 08/30/2014  . Diffuse pain 04/07/2014  . Numbness in both legs 10/16/2012  . Constipation 06/04/2012  . Hypercholesteremia 09/05/2011  . Osteoarthritis 04/12/2011  . 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 Hypothyroidism 07/12/2009  . ANXIETY 07/12/2009  . INSOMNIA, PERSISTENT 07/12/2009  . Essential hypertension 07/12/2009  . CEREBROVASCULAR ACCIDENT, HX OF 07/12/2009    Katherine Mantle, SPT 02/28/2016, 6:11 PM  [facility] [address] [place], [place], [address] Phone: [phone]   Fax:  [phone]  Name: DANNIA EWIG MRN: [id number] Date of Birth: 07-09-1940

## 2016-02-29 DIAGNOSIS — M1712 Unilateral primary osteoarthritis, left knee: Secondary | ICD-10-CM | POA: Diagnosis not present

## 2016-02-29 DIAGNOSIS — M25562 Pain in left knee: Secondary | ICD-10-CM | POA: Diagnosis not present

## 2016-03-06 ENCOUNTER — Ambulatory Visit: Payer: Commercial Managed Care - HMO | Admitting: Physical Therapy

## 2016-03-06 ENCOUNTER — Encounter: Payer: Self-pay | Admitting: Physical Therapy

## 2016-03-06 DIAGNOSIS — R269 Unspecified abnormalities of gait and mobility: Secondary | ICD-10-CM | POA: Diagnosis not present

## 2016-03-06 DIAGNOSIS — R296 Repeated falls: Secondary | ICD-10-CM | POA: Diagnosis not present

## 2016-03-06 DIAGNOSIS — M79604 Pain in right leg: Secondary | ICD-10-CM | POA: Diagnosis not present

## 2016-03-06 DIAGNOSIS — M6281 Muscle weakness (generalized): Secondary | ICD-10-CM

## 2016-03-06 DIAGNOSIS — M79605 Pain in left leg: Secondary | ICD-10-CM | POA: Diagnosis not present

## 2016-03-06 DIAGNOSIS — R2681 Unsteadiness on feet: Secondary | ICD-10-CM

## 2016-03-06 DIAGNOSIS — R2689 Other abnormalities of gait and mobility: Secondary | ICD-10-CM

## 2016-03-06 DIAGNOSIS — R293 Abnormal posture: Secondary | ICD-10-CM | POA: Diagnosis not present

## 2016-03-06 NOTE — Therapy (Signed)
[facility] [address] [place], [place], [address] Phone: [phone]   Fax:  [phone]  Physical Therapy Treatment  Patient Details  Name: Yvonne [name] MRN: [id number] Date of Birth: 1941-05-03 Referring Provider: Darron Doom, MD  Encounter Date: 03/06/2016      PT End of Session - 03/06/16 1409    Visit Number 2   Number of Visits 17   Date for PT Re-Evaluation 04/27/16   Authorization Type Medicare G Code and Progress Note   PT Start Time 1315   PT Stop Time 1400   PT Time Calculation (min) 45 min   Activity Tolerance Patient tolerated treatment well;Patient limited by fatigue   Behavior During Therapy [facility] for tasks assessed/performed      Past Medical History:  Diagnosis Date  . Allergy   . Hypertension   . Impaired functional mobility, balance, gait, and endurance 09/02/2015  . Mild cognitive impairment with memory loss 09/02/2015  . Stroke [facility])    pt was 40  . Thyroid disease     Past Surgical History:  Procedure Laterality Date  . ABDOMINAL HYSTERECTOMY    . BRAIN SURGERY    . TOE AMPUTATION  2013   2nd toe on right foot, hammer toe    There were no vitals filed for this visit.      Subjective Assessment - 03/06/16 1314    Subjective Pt reports no issues or falls since last visit.   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 No/denies                         [facility] Adult PT Treatment/Exercise - 03/06/16 1315      Transfers   Transfers Sit to Stand;Stand to [employer] Transfers   Sit to Stand 6: Modified independent (Device/Increase time);With upper extremity assist;With armrests;From chair/3-in-1   Stand to Sit 6: Modified independent (Device/Increase  time);With armrests;With upper extremity assist;To chair/3-in-1   Stand Pivot Transfers 5: Supervision;With armrests     Ambulation/Gait   Ambulation/Gait Yes   Ambulation/Gait Assistance 5: Supervision   Ambulation Distance (Feet) 80 Feet  2x71ft   Assistive device None   Gait Pattern Step-through pattern;Decreased arm swing - right;Decreased arm swing - left;Decreased step length - left;Decreased stance time - right;Decreased hip/knee flexion - right;Decreased hip/knee flexion - left;Ataxic;Lateral trunk lean to left;Trunk flexed;Narrow base of support;Poor foot clearance - left   Ambulation Surface Level;Indoor     Self-Care   Self-Care Other Self-Care Comments   Other Self-Care Comments  Pt was educated on fall prevention strategies at home and given handout for further assessment     Exercises   Exercises --             Balance Exercises - 03/06/16 1315      OTAGO PROGRAM   Head Movements Sitting;5 reps   Neck Movements Sitting;5 reps   Back Extension Standing;5 reps   Trunk Movements Standing;5 reps   Ankle Movements Sitting;10 reps   Knee Extensor 10 reps   Knee Flexor 10 reps  Standing at countertop   Hip ABductor 10 reps  Standing at countertop   Ankle Plantorflexors 20 reps, support  standing at countertop   Ankle Dorsiflexors 20 reps,  support  standing at countertop           PT Education - 03/06/16 1408    Education provided Yes   Education Details Pt educated on new OTEGO HEP and fall prevention techniques at home.   Person(s) Educated Patient   Methods Explanation;Demonstration;Verbal cues   Comprehension Returned demonstration;Verbalized understanding          PT Short Term Goals - 02/28/16 1806      PT SHORT TERM GOAL #1   Title Pt will be independent with intial HEP to address balance, flexibility, and weakness. (Target Date: 03/30/16)   Time 4   Period Weeks   Status New     PT SHORT TERM GOAL #2   Title Pt will be able to safely  reach 4" without balance loss. (Target Date: 03/30/16)   Time 4   Period Weeks   Status New     PT SHORT TERM GOAL #3   Title Pt will be able to safely ambulate 150 ft with rollator. (Target Date: 03/30/16)   Time 4   Period Weeks   Status New     PT SHORT TERM GOAL #4   Title Pt verbalizes an understanding of modifying activity level including AD's for pain management. (Target Date: 03/30/16)   Time 4   Period Weeks   Status New           PT Long Term Goals - 02/28/16 1756      PT LONG TERM GOAL #1   Title Pt verbalizes and demonstrates understanding of ongoing HEP/fitness plan (Target Date: 04/27/16   Time 8   Period Weeks   Status New     PT LONG TERM GOAL #2   Title Pt reports pain increases less than 2 increments on the 0-10 scale with standing and gait activites. (Target Date: 04/27/16)   Time 8   Period Weeks   Status New     PT LONG TERM GOAL #3   Title Pt will improve BERG Balance to > 40/56 to indicate lower fall risk. (Target Date:04/27/16)   Time 8   Period Weeks   Status New     PT LONG TERM GOAL #4   Title Pt will improve TUG score to < 16.5 seconds safely to indicate lower fall risk. (Target Date: 04/27/16)   Time 8   Period Weeks   Status New     PT LONG TERM GOAL #5   Title Pt will ambulate 300 ft including negotiating ramps and curbs with LRAD to enable community mobility. (Target Date: 04/27/16)   Time 8   Period Weeks   Status New     Additional Long Term Goals   Additional Long Term Goals Yes     PT LONG TERM GOAL #6   Title Pt will be able to navigate 157ft around furniture without an AD mod I to enable household mobility. (Target Date: 04/27/16)   Time 8   Period Weeks   Status New               Plan - 03/06/16 1410    Clinical Impression Statement Pt was introduced to the fall prevention program in order to decr her chances of falling at home.  She verbalized understanding of the various methods of ensuring safety and  preventing falling and was given take home instructions for further analysis.  She was also introduced to the OTEGO exercise program designed to help impove her mobility, strength, and balance and  was able to tolerate all exercises demonstrated.  She would continue to benefit from skilled PT to further progress the OTEGO program and address her deficits in strength, balance, gait, and functional mobility.   Rehab Potential Good   Clinical Impairments Affecting Rehab Potential Hard of hearing, cognitive deficits, R eye blindness   PT Frequency 2x / week   PT Duration 8 weeks   PT Treatment/Interventions ADLs/Self Care Home Management;Gait training;Stair training;Functional mobility training;Neuromuscular re-education;Balance training;Therapeutic exercise;Therapeutic activities;Manual techniques;Patient/family education;Orthotic Fit/Training;Visual/perceptual remediation/compensation;Energy conservation   PT Next Visit Plan introduce remaining OTEGO exercises and assess exercises already introduced, re-assess fall pevention strategies, exercises to correct gait, posture, and balance impairments   PT Home Exercise Plan OTEGO   Consulted and Agree with Plan of Care Patient      Patient will benefit from skilled therapeutic intervention in order to improve the following deficits and impairments:  Abnormal gait, Decreased activity tolerance, Decreased balance, Decreased cognition, Decreased mobility, Decreased endurance, Decreased range of motion, Decreased knowledge of use of DME, Decreased strength, Impaired sensation, Impaired flexibility, Postural dysfunction, Impaired vision/preception, Pain  Visit Diagnosis: Unsteadiness on feet  Other abnormalities of gait and mobility  Muscle weakness (generalized)  Repeated falls     Problem List Patient Active Problem List   Diagnosis Date Noted  . Mild cognitive impairment with memory loss 09/02/2015  . Impaired functional mobility, balance, gait,  and endurance 09/02/2015  . Hearing impairment 09/02/2015  . 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 Skin nodule 09/13/2014  . Osteopenia 08/30/2014  . Diffuse pain 04/07/2014  . Numbness in both legs 10/16/2012  . Constipation 06/04/2012  . Hypercholesteremia 09/05/2011  . Osteoarthritis 04/12/2011  . 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 Hypothyroidism 07/12/2009  . ANXIETY 07/12/2009  . INSOMNIA, PERSISTENT 07/12/2009  . Essential hypertension 07/12/2009  . CEREBROVASCULAR ACCIDENT, HX OF 07/12/2009    Katherine Mantle, SPT 03/06/2016, 4:47 PM  [facility] [address] [place], [place], [address] Phone: [phone]   Fax:  [phone]  Name: Yvonne [name] MRN: [id number] Date of Birth: June 22, 1940

## 2016-03-06 NOTE — Patient Instructions (Signed)
Fall Prevention in the Home  Falls can cause injuries and can affect people from all age groups. There are many simple things that you can do to make your home safe and to help prevent falls. WHAT CAN I DO ON THE OUTSIDE OF MY HOME?  Regularly repair the edges of walkways and driveways and fix any cracks.  Remove high doorway thresholds.  Trim any shrubbery on the main path into your home.  Use bright outdoor lighting.  Clear walkways of debris and clutter, including tools and rocks.  Regularly check that handrails are securely fastened and in good repair. Both sides of any steps should have handrails.  Install guardrails along the edges of any raised decks or porches.  Have leaves, snow, and ice cleared regularly.  Use sand or salt on walkways during winter months.  In the garage, clean up any spills right away, including grease or oil spills. WHAT CAN I DO IN THE BATHROOM?  Use night lights.  Install grab bars by the toilet and in the tub and shower. Do not use towel bars as grab bars.  Use non-skid mats or decals on the floor of the tub or shower.  If you need to sit down while you are in the shower, use a plastic, non-slip stool..  Keep the floor dry. Immediately clean up any water that spills on the floor.  Remove soap buildup in the tub or shower on a regular basis.  Attach bath mats securely with double-sided non-slip rug tape.  Remove throw rugs and other tripping hazards from the floor. WHAT CAN I DO IN THE BEDROOM?  Use night lights.  Make sure that a bedside light is easy to reach.  Do not use oversized bedding that drapes onto the floor.  Have a firm chair that has side arms to use for getting dressed.  Remove throw rugs and other tripping hazards from the floor. WHAT CAN I DO IN THE KITCHEN?   Clean up any spills right away.  Avoid walking on wet floors.  Place frequently used items in easy-to-reach places.  If you need to reach for something  above you, use a sturdy step stool that has a grab bar.  Keep electrical cables out of the way.  Do not use floor polish or wax that makes floors slippery. If you have to use wax, make sure that it is non-skid floor wax.  Remove throw rugs and other tripping hazards from the floor. WHAT CAN I DO IN THE STAIRWAYS?  Do not leave any items on the stairs.  Make sure that there are handrails on both sides of the stairs. Fix handrails that are broken or loose. Make sure that handrails are as long as the stairways.  Check any carpeting to make sure that it is firmly attached to the stairs. Fix any carpet that is loose or worn.  Avoid having throw rugs at the top or bottom of stairways, or secure the rugs with carpet tape to prevent them from moving.  Make sure that you have a light switch at the top of the stairs and the bottom of the stairs. If you do not have them, have them installed. WHAT ARE SOME OTHER FALL PREVENTION TIPS?  Wear closed-toe shoes that fit well and support your feet. Wear shoes that have rubber soles or low heels.  When you use a stepladder, make sure that it is completely opened and that the sides are firmly locked. Have someone hold the ladder while you   are using it. Do not climb a closed stepladder.  Add color or contrast paint or tape to grab bars and handrails in your home. Place contrasting color strips on the first and last steps.  Use mobility aids as needed, such as canes, walkers, scooters, and crutches.  Turn on lights if it is dark. Replace any light bulbs that burn out.  Set up furniture so that there are clear paths. Keep the furniture in the same spot.  Fix any uneven floor surfaces.  Choose a carpet design that does not hide the edge of steps of a stairway.  Be aware of any and all pets.  Review your medicines with your healthcare provider. Some medicines can cause dizziness or changes in blood pressure, which increase your risk of falling. Talk  with your health care provider about other ways that you can decrease your risk of falls. This may include working with a physical therapist or trainer to improve your strength, balance, and endurance.   This information is not intended to replace advice given to you by your health care provider. Make sure you discuss any questions you have with your health care provider.   Document Released: 05/25/2002 Document Revised: 10/19/2014 Document Reviewed: 07/09/2014 Elsevier Interactive Patient Education 2016 Elsevier Inc.  

## 2016-03-07 DIAGNOSIS — M1712 Unilateral primary osteoarthritis, left knee: Secondary | ICD-10-CM | POA: Diagnosis not present

## 2016-03-07 DIAGNOSIS — M25562 Pain in left knee: Secondary | ICD-10-CM | POA: Diagnosis not present

## 2016-03-08 ENCOUNTER — Ambulatory Visit: Payer: Commercial Managed Care - HMO | Admitting: Physical Therapy

## 2016-03-08 DIAGNOSIS — R296 Repeated falls: Secondary | ICD-10-CM | POA: Diagnosis not present

## 2016-03-08 DIAGNOSIS — R269 Unspecified abnormalities of gait and mobility: Secondary | ICD-10-CM | POA: Diagnosis not present

## 2016-03-08 DIAGNOSIS — R2689 Other abnormalities of gait and mobility: Secondary | ICD-10-CM

## 2016-03-08 DIAGNOSIS — M79604 Pain in right leg: Secondary | ICD-10-CM | POA: Diagnosis not present

## 2016-03-08 DIAGNOSIS — R2681 Unsteadiness on feet: Secondary | ICD-10-CM

## 2016-03-08 DIAGNOSIS — M6281 Muscle weakness (generalized): Secondary | ICD-10-CM

## 2016-03-08 DIAGNOSIS — R293 Abnormal posture: Secondary | ICD-10-CM | POA: Diagnosis not present

## 2016-03-08 DIAGNOSIS — M79605 Pain in left leg: Secondary | ICD-10-CM | POA: Diagnosis not present

## 2016-03-08 NOTE — Therapy (Signed)
[facility] [address] [place], [place], [address] Phone: [phone]   Fax:  [phone]  Physical Therapy Treatment  Patient Details  Name: Yvonne [name] MRN: [id number] Date of Birth: 07-14-1940 Referring Provider: Darron Doom, MD  Encounter Date: 03/08/2016      PT End of Session - 03/08/16 1406    Visit Number 3   Number of Visits 17   Date for PT Re-Evaluation 04/27/16   Authorization Type Medicare G Code and Progress Note   PT Start Time 1315   PT Stop Time 1402   PT Time Calculation (min) 47 min   Activity Tolerance Patient tolerated treatment well;Patient limited by fatigue   Behavior During Therapy [facility] for tasks assessed/performed      Past Medical History:  Diagnosis Date  . Allergy   . Hypertension   . Impaired functional mobility, balance, gait, and endurance 09/02/2015  . Mild cognitive impairment with memory loss 09/02/2015  . Stroke [facility])    pt was 40  . Thyroid disease     Past Surgical History:  Procedure Laterality Date  . ABDOMINAL HYSTERECTOMY    . BRAIN SURGERY    . TOE AMPUTATION  2013   2nd toe on right foot, hammer toe    There were no vitals filed for this visit.      Subjective Assessment - 03/08/16 1315    Subjective Pt reports she recieved her last injection in the L knee with flexogenics yesterday with minor "puffiness" resulting.  She also states she has been performing her exercises with only minor soreness.   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 Yes   Pain Score 2    Pain Location Hip   Pain Orientation Left   Pain Descriptors / Indicators Dull   Pain Type Acute pain   Pain Onset In the past 7 days   Pain Frequency Intermittent   Multiple Pain  Sites No                         [facility] Adult PT Treatment/Exercise - 03/08/16 1315      Transfers   Transfers Sit to Stand;Stand to [employer] Transfers   Sit to Stand 6: Modified independent (Device/Increase time)   Stand to Sit 6: Modified independent (Device/Increase time)   Stand Pivot Transfers 5: Supervision     Ambulation/Gait   Ambulation/Gait Yes   Ambulation/Gait Assistance 5: Supervision   Ambulation Distance (Feet) 80 Feet  2x80 ft to and from waiting room   Assistive device None   Gait Pattern Step-to pattern;Decreased arm swing - right;Decreased arm swing - left;Decreased stance time - left;Decreased step length - left;Decreased hip/knee flexion - left;Decreased weight shift to right;Ataxic;Lateral trunk lean to left;Trunk flexed;Narrow base of support;Poor foot clearance - left   Ambulation Surface Level;Indoor             Balance Exercises - 03/08/16 1529      OTAGO PROGRAM   Knee Bends 10 reps, support   Backwards Walking Support   Walking and Turning Around No assistive device   Sideways Walking Assistive device  BUE on countertop   Tandem Stance 10 seconds, support   Tandem Walk Support  modified tandem  One Leg Stand 10 seconds, support   Heel Walking Support   Toe Walk Support   Sit to Stand 10 reps, no support           PT Education - 03/08/16 1405    Education provided Yes   Education Details Pt educated on new OTAGO exercises and instructed on corrections for previously learned exercises.   Person(s) Educated Patient   Methods Explanation;Demonstration;Verbal cues;Tactile cues   Comprehension Verbalized understanding;Returned demonstration          PT Short Term Goals - 03/06/16 1650      PT SHORT TERM GOAL #1   Title Pt will be independent with intial HEP to address balance, flexibility, and weakness. (Target Date: 03/30/16)   Time 4   Period Weeks   Status On-going     PT SHORT TERM GOAL #2   Title Pt  will be able to safely reach 4" without balance loss. (Target Date: 03/30/16)   Time 4   Period Weeks   Status On-going     PT SHORT TERM GOAL #3   Title Pt will be able to safely ambulate 150 ft with rollator. (Target Date: 03/30/16)   Time 4   Period Weeks   Status On-going     PT SHORT TERM GOAL #4   Title Pt verbalizes an understanding of modifying activity level including AD's for pain management. (Target Date: 03/30/16)   Time 4   Period Weeks   Status On-going           PT Long Term Goals - 03/06/16 1650      PT LONG TERM GOAL #1   Title Pt verbalizes and demonstrates understanding of ongoing HEP/fitness plan (Target Date: 04/27/16   Time 8   Period Weeks   Status On-going     PT LONG TERM GOAL #2   Title Pt reports pain increases less than 2 increments on the 0-10 scale with standing and gait activites. (Target Date: 04/27/16)   Time 8   Period Weeks   Status On-going     PT LONG TERM GOAL #3   Title Pt will improve BERG Balance to > 40/56 to indicate lower fall risk. (Target Date:04/27/16)   Time 8   Period Weeks   Status On-going     PT LONG TERM GOAL #4   Title Pt will improve TUG score to < 16.5 seconds safely to indicate lower fall risk. (Target Date: 04/27/16)   Time 8   Period Weeks   Status On-going     PT LONG TERM GOAL #5   Title Pt will ambulate 300 ft including negotiating ramps and curbs with LRAD to enable community mobility. (Target Date: 04/27/16)   Time 8   Period Weeks   Status On-going     PT LONG TERM GOAL #6   Title Pt will be able to navigate 172ft around furniture without an AD mod I to enable household mobility. (Target Date: 04/27/16)   Time 8   Period Weeks   Status On-going               Plan - 03/08/16 1406    Clinical Impression Statement Pt performed well in therapy session today with no reports of lack of understanding or issues performing OTAGO exercises introduced during last visit.  She was introduced to  remaining exercises in OTAGO program during today's session and was able to complete all tasks with correct form and sequencing and verbalized understanding.  Pt  is currently compliant with performing exercises on a daily basis.  She would continue to benefit from skilled PT to address further strengthening and balance deficits she currently possesses.     Rehab Potential Good   Clinical Impairments Affecting Rehab Potential Hard of hearing, cognitive deficits, R eye blindness   PT Frequency 2x / week   PT Duration 8 weeks   PT Treatment/Interventions ADLs/Self Care Home Management;Gait training;Stair training;Functional mobility training;Neuromuscular re-education;Balance training;Therapeutic exercise;Therapeutic activities;Manual techniques;Patient/family education;Orthotic Fit/Training;Visual/perceptual remediation/compensation;Energy conservation   PT Next Visit Plan introduce remaining OTEGO exercises and assess exercises already introduced, re-assess fall pevention strategies, exercises to correct gait, posture, and balance impairments   PT Home Exercise Plan OTAGO   Consulted and Agree with Plan of Care Patient      Patient will benefit from skilled therapeutic intervention in order to improve the following deficits and impairments:  Abnormal gait, Decreased activity tolerance, Decreased balance, Decreased cognition, Decreased mobility, Decreased endurance, Decreased range of motion, Decreased knowledge of use of DME, Decreased strength, Impaired sensation, Impaired flexibility, Postural dysfunction, Impaired vision/preception, Pain  Visit Diagnosis: Unsteadiness on feet  Other abnormalities of gait and mobility  Muscle weakness (generalized)  Repeated falls  Abnormality of gait     Problem List Patient Active Problem List   Diagnosis Date Noted  . Mild cognitive impairment with memory loss 09/02/2015  . Impaired functional mobility, balance, gait, and endurance 09/02/2015  .  Hearing impairment 09/02/2015  . 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 Skin nodule 09/13/2014  . Osteopenia 08/30/2014  . Diffuse pain 04/07/2014  . Numbness in both legs 10/16/2012  . Constipation 06/04/2012  . Hypercholesteremia 09/05/2011  . Osteoarthritis 04/12/2011  . 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 Hypothyroidism 07/12/2009  . ANXIETY 07/12/2009  . INSOMNIA, PERSISTENT 07/12/2009  . Essential hypertension 07/12/2009  . CEREBROVASCULAR ACCIDENT, HX OF 07/12/2009    Katherine Mantle 03/08/2016, 3:31 PM  [facility] [address] [place], [place], [address] Phone: [phone]   Fax:  [phone]  Name: ALYIA BONNIN MRN: [id number] Date of Birth: Oct 30, 1940

## 2016-03-13 ENCOUNTER — Encounter: Payer: Self-pay | Admitting: Physical Therapy

## 2016-03-13 ENCOUNTER — Ambulatory Visit: Payer: Commercial Managed Care - HMO | Admitting: Physical Therapy

## 2016-03-13 DIAGNOSIS — R2681 Unsteadiness on feet: Secondary | ICD-10-CM | POA: Diagnosis not present

## 2016-03-13 DIAGNOSIS — M79605 Pain in left leg: Secondary | ICD-10-CM | POA: Diagnosis not present

## 2016-03-13 DIAGNOSIS — R2689 Other abnormalities of gait and mobility: Secondary | ICD-10-CM

## 2016-03-13 DIAGNOSIS — M6281 Muscle weakness (generalized): Secondary | ICD-10-CM | POA: Diagnosis not present

## 2016-03-13 DIAGNOSIS — R293 Abnormal posture: Secondary | ICD-10-CM | POA: Diagnosis not present

## 2016-03-13 DIAGNOSIS — R296 Repeated falls: Secondary | ICD-10-CM

## 2016-03-13 DIAGNOSIS — M79604 Pain in right leg: Secondary | ICD-10-CM | POA: Diagnosis not present

## 2016-03-13 DIAGNOSIS — R269 Unspecified abnormalities of gait and mobility: Secondary | ICD-10-CM | POA: Diagnosis not present

## 2016-03-14 DIAGNOSIS — M1712 Unilateral primary osteoarthritis, left knee: Secondary | ICD-10-CM | POA: Diagnosis not present

## 2016-03-14 DIAGNOSIS — M25562 Pain in left knee: Secondary | ICD-10-CM | POA: Diagnosis not present

## 2016-03-14 NOTE — Therapy (Signed)
[facility] [address] [place], [place], [address] Phone: [phone]   Fax:  [phone]  Physical Therapy Treatment  Patient Details  Name: Yvonne [name] MRN: [id number] Date of Birth: 08/25/1940 Referring Provider: Darron Doom, MD  Encounter Date: 03/13/2016      PT End of Session - 03/13/16 1110    Visit Number 4   Number of Visits 17   Date for PT Re-Evaluation 04/27/16   Authorization Type Medicare G Code and Progress Note   PT Start Time 1103   PT Stop Time 1145   PT Time Calculation (min) 42 min   Activity Tolerance Patient tolerated treatment well;Patient limited by fatigue   Behavior During Therapy [facility] for tasks assessed/performed;Impulsive  impulsive at times      Past Medical History:  Diagnosis Date  . Allergy   . Hypertension   . Impaired functional mobility, balance, gait, and endurance 09/02/2015  . Mild cognitive impairment with memory loss 09/02/2015  . Stroke [facility])    pt was 40  . Thyroid disease     Past Surgical History:  Procedure Laterality Date  . ABDOMINAL HYSTERECTOMY    . BRAIN SURGERY    . TOE AMPUTATION  2013   2nd toe on right foot, hammer toe    There were no vitals filed for this visit.      Subjective Assessment - 03/13/16 1107    Subjective Pt presents to clinic today thinking she may have had a second stroke in the past and that is why she is weak in the left leg (after discussion with another pt in lobby).   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 Yes   Pain Score 3    Pain Location Leg   Pain Orientation Left   Pain Descriptors / Indicators Aching;Dull;Sharp;Sore   Pain Onset 1 to 4 weeks ago   Pain Frequency Intermittent   Aggravating Factors   standing, walking   Pain Relieving Factors rest, pain medication (ibuprofen)          Balance Exercises - 03/13/16 1111      OTAGO PROGRAM   Head Movements Sitting;5 reps   Neck Movements Sitting;5 reps   Back Extension Standing;5 reps   Trunk Movements Standing;5 reps   Ankle Movements Sitting;10 reps   Knee Extensor 10 reps  with red theraband around ankles   Knee Flexor 10 reps   Hip ABductor 10 reps   Ankle Plantorflexors 20 reps, support   Ankle Dorsiflexors 20 reps, support   Knee Bends 10 reps, support   Backwards Walking Support   Walking and Turning Around Assistive device  intermittent counter support   Sideways Walking Assistive device   Tandem Stance 10 seconds, support   Tandem Walk Support   One Leg Stand 10 seconds, support   Heel Walking Support   Toe Walk Support   Sit to Stand 10 reps, no support   Overall OTAGO Comments cues needed to slow down and for exercise form/technique. counter top used for stability with standing exercises/balance activities.                          PT Short Term Goals - 03/06/16 1650  PT SHORT TERM GOAL #1   Title Pt will be independent with intial HEP to address balance, flexibility, and weakness. (Target Date: 03/30/16)   Time 4   Period Weeks   Status On-going     PT SHORT TERM GOAL #2   Title Pt will be able to safely reach 4" without balance loss. (Target Date: 03/30/16)   Time 4   Period Weeks   Status On-going     PT SHORT TERM GOAL #3   Title Pt will be able to safely ambulate 150 ft with rollator. (Target Date: 03/30/16)   Time 4   Period Weeks   Status On-going     PT SHORT TERM GOAL #4   Title Pt verbalizes an understanding of modifying activity level including AD's for pain management. (Target Date: 03/30/16)   Time 4   Period Weeks   Status On-going           PT Long Term Goals - 03/06/16 1650      PT LONG TERM GOAL #1   Title Pt verbalizes and demonstrates understanding of  ongoing HEP/fitness plan (Target Date: 04/27/16   Time 8   Period Weeks   Status On-going     PT LONG TERM GOAL #2   Title Pt reports pain increases less than 2 increments on the 0-10 scale with standing and gait activites. (Target Date: 04/27/16)   Time 8   Period Weeks   Status On-going     PT LONG TERM GOAL #3   Title Pt will improve BERG Balance to > 40/56 to indicate lower fall risk. (Target Date:04/27/16)   Time 8   Period Weeks   Status On-going     PT LONG TERM GOAL #4   Title Pt will improve TUG score to < 16.5 seconds safely to indicate lower fall risk. (Target Date: 04/27/16)   Time 8   Period Weeks   Status On-going     PT LONG TERM GOAL #5   Title Pt will ambulate 300 ft including negotiating ramps and curbs with LRAD to enable community mobility. (Target Date: 04/27/16)   Time 8   Period Weeks   Status On-going     PT LONG TERM GOAL #6   Title Pt will be able to navigate 157ft around furniture without an AD mod I to enable household mobility. (Target Date: 04/27/16)   Time 8   Period Weeks   Status On-going            Plan - 03/13/16 1110    Clinical Impression Statement [place] program for HEP finalized today with cues for correct technique/form added into pt's book. Pt is making steady progress toward goals, does need cues for general safety with mobility at times. Pt should benefit from continued PT to progress toward unmet goals.    Rehab Potential Good   Clinical Impairments Affecting Rehab Potential Hard of hearing, cognitive deficits, R eye blindness   PT Frequency 2x / week   PT Duration 8 weeks   PT Treatment/Interventions ADLs/Self Care Home Management;Gait training;Stair training;Functional mobility training;Neuromuscular re-education;Balance training;Therapeutic exercise;Therapeutic activities;Manual techniques;Patient/family education;Orthotic Fit/Training;Visual/perceptual remediation/compensation;Energy conservation   PT Next Visit Plan  review as needed fall pevention strategies, exercises to correct gait, posture, and balance impairments   PT Home Exercise Plan OTAGO   Consulted and Agree with Plan of Care Patient      Patient will benefit from skilled therapeutic intervention in order to improve the following deficits and impairments:  Abnormal  gait, Decreased activity tolerance, Decreased balance, Decreased cognition, Decreased mobility, Decreased endurance, Decreased range of motion, Decreased knowledge of use of DME, Decreased strength, Impaired sensation, Impaired flexibility, Postural dysfunction, Impaired vision/preception, Pain  Visit Diagnosis: Unsteadiness on feet  Other abnormalities of gait and mobility  Muscle weakness (generalized)  Repeated falls     Problem List Patient Active Problem List   Diagnosis Date Noted  . Mild cognitive impairment with memory loss 09/02/2015  . Impaired functional mobility, balance, gait, and endurance 09/02/2015  . Hearing impairment 09/02/2015  . 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 Skin nodule 09/13/2014  . Osteopenia 08/30/2014  . Diffuse pain 04/07/2014  . Numbness in both legs 10/16/2012  . Constipation 06/04/2012  . Hypercholesteremia 09/05/2011  . Osteoarthritis 04/12/2011  . 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 Hypothyroidism 07/12/2009  . ANXIETY 07/12/2009  . INSOMNIA, PERSISTENT 07/12/2009  . Essential hypertension 07/12/2009  . CEREBROVASCULAR ACCIDENT, HX OF 07/12/2009    Willow Ora, PTA, [facility] [address] [place], [place] [address] [phone] 03/14/16, 2:26 PM   Name: Yvonne [name] MRN: [id number] Date of Birth: 13-Jul-1940

## 2016-03-16 ENCOUNTER — Ambulatory Visit: Payer: Commercial Managed Care - HMO | Admitting: Physical Therapy

## 2016-03-16 ENCOUNTER — Encounter: Payer: Self-pay | Admitting: Physical Therapy

## 2016-03-16 DIAGNOSIS — R269 Unspecified abnormalities of gait and mobility: Secondary | ICD-10-CM | POA: Diagnosis not present

## 2016-03-16 DIAGNOSIS — R2681 Unsteadiness on feet: Secondary | ICD-10-CM

## 2016-03-16 DIAGNOSIS — M6281 Muscle weakness (generalized): Secondary | ICD-10-CM | POA: Diagnosis not present

## 2016-03-16 DIAGNOSIS — M79604 Pain in right leg: Secondary | ICD-10-CM | POA: Diagnosis not present

## 2016-03-16 DIAGNOSIS — R293 Abnormal posture: Secondary | ICD-10-CM | POA: Diagnosis not present

## 2016-03-16 DIAGNOSIS — R296 Repeated falls: Secondary | ICD-10-CM | POA: Diagnosis not present

## 2016-03-16 DIAGNOSIS — R2689 Other abnormalities of gait and mobility: Secondary | ICD-10-CM

## 2016-03-16 DIAGNOSIS — M79605 Pain in left leg: Secondary | ICD-10-CM | POA: Diagnosis not present

## 2016-03-16 NOTE — Therapy (Addendum)
[facility] [address] [place], [place], [address] Phone: [phone]   Fax:  [phone]  Physical Therapy Treatment  Patient Details  Name: Yvonne [name] MRN: [id number] Date of Birth: 07/13/1940 Referring Provider: Darron Doom, MD  Encounter Date: 03/16/2016      PT End of Session - 03/16/16 1708    Visit Number 5   Number of Visits 17   Date for PT Re-Evaluation 04/27/16   Authorization Type Medicare G Code and Progress Note   PT Start Time 1315   PT Stop Time 1400   PT Time Calculation (min) 45 min   Activity Tolerance Patient tolerated treatment well;Patient limited by fatigue   Behavior During Therapy [facility] for tasks assessed/performed;Impulsive  impulsive at times      Past Medical History:  Diagnosis Date  . Allergy   . Hypertension   . Impaired functional mobility, balance, gait, and endurance 09/02/2015  . Mild cognitive impairment with memory loss 09/02/2015  . Stroke [facility])    pt was 40  . Thyroid disease     Past Surgical History:  Procedure Laterality Date  . ABDOMINAL HYSTERECTOMY    . BRAIN SURGERY    . TOE AMPUTATION  2013   2nd toe on right foot, hammer toe    There were no vitals filed for this visit.      Subjective Assessment - 03/16/16 1317    Subjective Pt reports receiving her last injection with [employer] and is having no issues since last visit.  She states that "it's feeling better".   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 Yes   Pain Score 2    Pain Location Knee   Pain Orientation Left   Pain Onset 1 to 4 weeks ago   Multiple Pain Sites No            [facility] Adult PT Treatment/Exercise - 03/16/16 1315      Transfers   Transfers Sit to  Stand;Stand to [employer] Transfers   Sit to Stand 6: Modified independent (Device/Increase time);With upper extremity assist;With armrests;From chair/3-in-1;From bed   Stand to Sit 6: Modified independent (Device/Increase time);With upper extremity assist;With armrests;To chair/3-in-1;To bed   Stand Pivot Transfers 5: Supervision;With armrests     Ambulation/Gait   Ambulation/Gait Yes   Ambulation/Gait Assistance 5: Supervision   Ambulation/Gait Assistance Details Verbal, tactile, and visual (using mirror for feedback) cuing needed to correct L lat trunk lean and incr step length on R LE and incr stance time on L LE.  Pt also cued to maintain upright posture throughout gait activities.   Ambulation Distance (Feet) 200 Feet  1x275ft, 2x6ft, 2x74ft   Assistive device None   Gait Pattern Step-to pattern;Decreased arm swing - right;Decreased arm swing - left;Decreased step length - right;Decreased stance time - left;Left flexed knee in stance;Ataxic;Lateral trunk lean to left;Trunk flexed;Narrow base of support   Ambulation Surface Level;Indoor     Neuro Re-ed    Neuro Re-ed Details  Pt ambulated 4x46ft while reaching with R UE as far down on wall as possible while maintaining upright posture in order to correct L lat trunk lean.  Pt required extensive verbal, tactile, and demo cuing for sequencing and proper form.  Tactile cuing at R hip  and L shoulder to assist pt in understanding correction and achieve position.     Exercises   Exercises Knee/Hip     Knee/Hip Exercises: Standing   Other Standing Knee Exercises Pt performed 1x20 B step ups on 6" step to incr strength in B LE to improve gait pattern.  Verbal and tactile cuing required for proper sequencing and posture correction to prevent flexed trunk and L lat trunk lean with concurrent R hip protraction.     Knee/Hip Exercises: Seated   Abduction/Adduction  Strengthening;Both;2 sets;20 reps;Other (comment)  Red TB   Abd/Adduction  Limitations Pt required verbal and tactile cuing to hold contraction for 2 seconds then slowly control the eccentric contraction back to neutral.            PT Education - 03/16/16 1707    Education provided Yes   Education Details Pt educated to continue [place] program at home and also instructed on where to find a used cane for incr stability when ambulating.   Person(s) Educated Patient   Methods Explanation   Comprehension Verbalized understanding          PT Short Term Goals - 03/06/16 1650      PT SHORT TERM GOAL #1   Title Pt will be independent with intial HEP to address balance, flexibility, and weakness. (Target Date: 03/30/16)   Time 4   Period Weeks   Status On-going     PT SHORT TERM GOAL #2   Title Pt will be able to safely reach 4" without balance loss. (Target Date: 03/30/16)   Time 4   Period Weeks   Status On-going     PT SHORT TERM GOAL #3   Title Pt will be able to safely ambulate 150 ft with rollator. (Target Date: 03/30/16)   Time 4   Period Weeks   Status On-going     PT SHORT TERM GOAL #4   Title Pt verbalizes an understanding of modifying activity level including AD's for pain management. (Target Date: 03/30/16)   Time 4   Period Weeks   Status On-going           PT Long Term Goals - 03/06/16 1650      PT LONG TERM GOAL #1   Title Pt verbalizes and demonstrates understanding of ongoing HEP/fitness plan (Target Date: 04/27/16   Time 8   Period Weeks   Status On-going     PT LONG TERM GOAL #2   Title Pt reports pain increases less than 2 increments on the 0-10 scale with standing and gait activites. (Target Date: 04/27/16)   Time 8   Period Weeks   Status On-going     PT LONG TERM GOAL #3   Title Pt will improve BERG Balance to > 40/56 to indicate lower fall risk. (Target Date:04/27/16)   Time 8   Period Weeks   Status On-going     PT LONG TERM GOAL #4   Title Pt will improve TUG score to < 16.5 seconds safely to indicate  lower fall risk. (Target Date: 04/27/16)   Time 8   Period Weeks   Status On-going     PT LONG TERM GOAL #5   Title Pt will ambulate 300 ft including negotiating ramps and curbs with LRAD to enable community mobility. (Target Date: 04/27/16)   Time 8   Period Weeks   Status On-going     PT LONG TERM GOAL #6   Title Pt will be able to navigate  129ft around furniture without an AD mod I to enable household mobility. (Target Date: 04/27/16)   Time 8   Period Weeks   Status On-going               Plan - 03/16/16 1708    Clinical Impression Statement Pt continues to progress in therapy sessions with treatment now focusing on gait quality and overall balance.  Today, pt was able to tolerate higher level strengthening exercises in order to improve gait quality and increased distances with incr safety after cues to decr fall risk.  She continues to fatigue quickly causing a decr in balance and a need for frequent rest breaks.  SPT advised pt to purchase a SPC to use when ambulating in the community for an added measure of safety, especially when walking long distances.Pt verbalized understanding and agreed. Pt plans to look into used canes at [employer], online, etc.   She will continue to benefit from skilled PT to address her impairments of decr strength, functional mobility, and functional balance.   Rehab Potential Good   Clinical Impairments Affecting Rehab Potential Hard of hearing, cognitive deficits, R eye blindness   PT Frequency 2x / week   PT Duration 8 weeks   PT Treatment/Interventions ADLs/Self Care Home Management;Gait training;Stair training;Functional mobility training;Neuromuscular re-education;Balance training;Therapeutic exercise;Therapeutic activities;Manual techniques;Patient/family education;Orthotic Fit/Training;Visual/perceptual remediation/compensation;Energy conservation   PT Next Visit Plan Continue gait training to correct impairments, strengthen B LE, and  incorporate static and functional balance activities. Gait train with [facility] for effective use/carryover with community ambulation.   PT Home Exercise Plan OTAGO   Consulted and Agree with Plan of Care Patient      Patient will benefit from skilled therapeutic intervention in order to improve the following deficits and impairments:  Abnormal gait, Decreased activity tolerance, Decreased balance, Decreased cognition, Decreased mobility, Decreased endurance, Decreased range of motion, Decreased knowledge of use of DME, Decreased strength, Impaired sensation, Impaired flexibility, Postural dysfunction, Impaired vision/preception, Pain  Visit Diagnosis: Other abnormalities of gait and mobility  Abnormality of gait  Muscle weakness (generalized)  Unsteadiness on feet     Problem List Patient Active Problem List   Diagnosis Date Noted  . Mild cognitive impairment with memory loss 09/02/2015  . Impaired functional mobility, balance, gait, and endurance 09/02/2015  . Hearing impairment 09/02/2015  . 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 Skin nodule 09/13/2014  . Osteopenia 08/30/2014  . Diffuse pain 04/07/2014  . Numbness in both legs 10/16/2012  . Constipation 06/04/2012  . Hypercholesteremia 09/05/2011  . Osteoarthritis 04/12/2011  . 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 Hypothyroidism 07/12/2009  . ANXIETY 07/12/2009  . INSOMNIA, PERSISTENT 07/12/2009  . Essential hypertension 07/12/2009  . CEREBROVASCULAR ACCIDENT, HX OF 07/12/2009    Katherine Mantle 03/16/2016, 5:17 PM  [facility] [address] [place], [place], [address] Phone: [phone]   Fax:  [phone]  Name: Yvonne [name] MRN: [id number] Date of Birth: Jul 25, 1940  This note has been reviewed and edited by supervising CI. Additons made with clinical impression statement and plan for next session.  Willow Ora, PTA,  [facility] [address], [place], [place] [address] [phone] 03/16/16, 8:04 PM

## 2016-03-19 ENCOUNTER — Encounter: Payer: Self-pay | Admitting: Physical Therapy

## 2016-03-19 ENCOUNTER — Ambulatory Visit: Payer: Commercial Managed Care - HMO | Admitting: Physical Therapy

## 2016-03-19 ENCOUNTER — Ambulatory Visit: Payer: Commercial Managed Care - HMO | Attending: Family Medicine | Admitting: Physical Therapy

## 2016-03-19 DIAGNOSIS — R296 Repeated falls: Secondary | ICD-10-CM | POA: Diagnosis not present

## 2016-03-19 DIAGNOSIS — R293 Abnormal posture: Secondary | ICD-10-CM | POA: Diagnosis not present

## 2016-03-19 DIAGNOSIS — M6281 Muscle weakness (generalized): Secondary | ICD-10-CM | POA: Diagnosis not present

## 2016-03-19 DIAGNOSIS — R262 Difficulty in walking, not elsewhere classified: Secondary | ICD-10-CM | POA: Diagnosis not present

## 2016-03-19 DIAGNOSIS — M79605 Pain in left leg: Secondary | ICD-10-CM | POA: Insufficient documentation

## 2016-03-19 DIAGNOSIS — M79604 Pain in right leg: Secondary | ICD-10-CM | POA: Diagnosis not present

## 2016-03-19 DIAGNOSIS — R2689 Other abnormalities of gait and mobility: Secondary | ICD-10-CM | POA: Diagnosis not present

## 2016-03-19 DIAGNOSIS — R2681 Unsteadiness on feet: Secondary | ICD-10-CM

## 2016-03-20 NOTE — Therapy (Signed)
[facility] [address] [place], [place], [address] Phone: [phone]   Fax:  [phone]  Physical Therapy Treatment  Patient Details  Name: Yvonne [name] MRN: [id number] Date of Birth: 1941/05/27 Referring Provider: Darron Doom, MD  Encounter Date: 03/19/2016      PT End of Session - 03/19/16 1410    Visit Number 6   Number of Visits 17   Date for PT Re-Evaluation 04/27/16   Authorization Type Medicare G Code and Progress Note   PT Start Time [id number]   PT Stop Time 1445   PT Time Calculation (min) 40 min   Activity Tolerance Patient tolerated treatment well;Patient limited by fatigue   Behavior During Therapy [facility] for tasks assessed/performed;Impulsive  impulsive at times      Past Medical History:  Diagnosis Date  . Allergy   . Hypertension   . Impaired functional mobility, balance, gait, and endurance 09/02/2015  . Mild cognitive impairment with memory loss 09/02/2015  . Stroke [facility])    pt was 40  . Thyroid disease     Past Surgical History:  Procedure Laterality Date  . ABDOMINAL HYSTERECTOMY    . BRAIN SURGERY    . TOE AMPUTATION  2013   2nd toe on right foot, hammer toe    There were no vitals filed for this visit.      Subjective Assessment - 03/19/16 1407    Subjective No new complaints. No falls to report.    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 Yes   Pain Score 3    Pain Location Generalized  pt did not state a specific spot, just "it's numb"   Pain Descriptors / Indicators Numbness   Pain Type Chronic pain   Pain Onset More than a month ago   Pain Frequency Intermittent   Aggravating Factors  walking   Pain Relieving Factors sitting down, resting              [facility] Adult PT Treatment/Exercise - 03/19/16 1411      Transfers   Transfers --   Sit to Stand 6: Modified independent (Device/Increase time);With upper extremity assist;With armrests;From chair/3-in-1;From bed   Stand to Sit 6: Modified independent (Device/Increase time);With upper extremity assist;With armrests;To chair/3-in-1;To bed     Ambulation/Gait   Ambulation/Gait Yes   Ambulation/Gait Assistance 5: Supervision   Ambulation/Gait Assistance Details cues needed for sequencing with cane, posture and for increased/equal step length. cues needed with rollator for proximity with gait and for posture. Pt able to take more fluid step with rollator vs cane.   Ambulation Distance (Feet) 115 Feet  x1, 220 x1 w/ cane; 220 x1, 300 x1 (in/outdoor with rollator   Assistive device Straight cane;Rollator   Gait Pattern Step-to pattern;Decreased arm swing - right;Decreased arm swing - left;Decreased step length - right;Decreased stance time - left;Left flexed knee in stance;Ataxic;Lateral trunk lean to left;Trunk flexed;Narrow base of support   Ambulation Surface Level;Indoor;Unlevel;Outdoor;Paved     High Level Balance   High Level Balance Activities Side stepping;Marching forwards;Marching backwards;Tandem walking;Negotitating around obstacles   High Level Balance Comments in parallel bars: high knee marching, tandem walking both fwd/bwd and side stepping left<>right x 3 laps each with UE support on bars, cues on posture and  ex form/technique. with cane weaving around cones (8) with min assist and cues on form/technique/posture/cane placment.              PT Short Term Goals - 03/06/16 1650      PT SHORT TERM GOAL #1   Title Pt will be independent with intial HEP to address balance, flexibility, and weakness. (Target Date: 03/30/16)   Time 4   Period Weeks   Status On-going     PT SHORT TERM GOAL #2   Title Pt will be able to safely reach 4" without balance loss. (Target Date:  03/30/16)   Time 4   Period Weeks   Status On-going     PT SHORT TERM GOAL #3   Title Pt will be able to safely ambulate 150 ft with rollator. (Target Date: 03/30/16)   Time 4   Period Weeks   Status On-going     PT SHORT TERM GOAL #4   Title Pt verbalizes an understanding of modifying activity level including AD's for pain management. (Target Date: 03/30/16)   Time 4   Period Weeks   Status On-going           PT Long Term Goals - 03/06/16 1650      PT LONG TERM GOAL #1   Title Pt verbalizes and demonstrates understanding of ongoing HEP/fitness plan (Target Date: 04/27/16   Time 8   Period Weeks   Status On-going     PT LONG TERM GOAL #2   Title Pt reports pain increases less than 2 increments on the 0-10 scale with standing and gait activites. (Target Date: 04/27/16)   Time 8   Period Weeks   Status On-going     PT LONG TERM GOAL #3   Title Pt will improve BERG Balance to > 40/56 to indicate lower fall risk. (Target Date:04/27/16)   Time 8   Period Weeks   Status On-going     PT LONG TERM GOAL #4   Title Pt will improve TUG score to < 16.5 seconds safely to indicate lower fall risk. (Target Date: 04/27/16)   Time 8   Period Weeks   Status On-going     PT LONG TERM GOAL #5   Title Pt will ambulate 300 ft including negotiating ramps and curbs with LRAD to enable community mobility. (Target Date: 04/27/16)   Time 8   Period Weeks   Status On-going     PT LONG TERM GOAL #6   Title Pt will be able to navigate 173ft around furniture without an AD mod I to enable household mobility. (Target Date: 04/27/16)   Time 8   Period Weeks   Status On-going           Plan - 03/19/16 1410    Clinical Impression Statement Today's session focused on gait with both a straight cane and rollator. Pt demo's increased stability and more normalized gait pattern when using a rollator, along with reports of decreased back pain with gait when using a rollator. Pt however is not  fully in agreement with using the rollator at all times stating "I just want to walk" as the reason. Pt is making slow, steady progress toward goals and should benefit from continued PT to progress toward unmet goals.                     Rehab Potential Good   Clinical Impairments Affecting Rehab Potential Hard of hearing, cognitive deficits, R eye blindness  PT Frequency 2x / week   PT Duration 8 weeks   PT Treatment/Interventions ADLs/Self Care Home Management;Gait training;Stair training;Functional mobility training;Neuromuscular re-education;Balance training;Therapeutic exercise;Therapeutic activities;Manual techniques;Patient/family education;Orthotic Fit/Training;Visual/perceptual remediation/compensation;Energy conservation   PT Next Visit Plan Continue gait training to correct impairments, strengthen B LE, and incorporate static and functional balance activities. STGs due next week (by 03/30/16)   PT Home Exercise Plan OTAGO   Consulted and Agree with Plan of Care Patient      Patient will benefit from skilled therapeutic intervention in order to improve the following deficits and impairments:  Abnormal gait, Decreased activity tolerance, Decreased balance, Decreased cognition, Decreased mobility, Decreased endurance, Decreased range of motion, Decreased knowledge of use of DME, Decreased strength, Impaired sensation, Impaired flexibility, Postural dysfunction, Impaired vision/preception, Pain  Visit Diagnosis: Other abnormalities of gait and mobility  Muscle weakness (generalized)  Unsteadiness on feet  Repeated falls  Abnormal posture  Difficulty in walking, not elsewhere classified  Pain in right leg     Problem List Patient Active Problem List   Diagnosis Date Noted  . Mild cognitive impairment with memory loss 09/02/2015  . Impaired functional mobility, balance, gait, and endurance 09/02/2015  . Hearing impairment 09/02/2015  . 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 Skin  nodule 09/13/2014  . Osteopenia 08/30/2014  . Diffuse pain 04/07/2014  . Numbness in both legs 10/16/2012  . Constipation 06/04/2012  . Hypercholesteremia 09/05/2011  . Osteoarthritis 04/12/2011  . 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 Hypothyroidism 07/12/2009  . ANXIETY 07/12/2009  . INSOMNIA, PERSISTENT 07/12/2009  . Essential hypertension 07/12/2009  . CEREBROVASCULAR ACCIDENT, HX OF 07/12/2009    Willow Ora, PTA, [facility] [address] [place], [place] [address] [phone] 03/20/16, 10:17 AM   Name: Yvonne [name] MRN: [id number] Date of Birth: 09/05/1940

## 2016-03-21 ENCOUNTER — Ambulatory Visit: Payer: Commercial Managed Care - HMO | Admitting: Physical Therapy

## 2016-03-21 ENCOUNTER — Encounter: Payer: Self-pay | Admitting: Physical Therapy

## 2016-03-21 DIAGNOSIS — M6281 Muscle weakness (generalized): Secondary | ICD-10-CM | POA: Diagnosis not present

## 2016-03-21 DIAGNOSIS — R2681 Unsteadiness on feet: Secondary | ICD-10-CM | POA: Diagnosis not present

## 2016-03-21 DIAGNOSIS — R293 Abnormal posture: Secondary | ICD-10-CM

## 2016-03-21 DIAGNOSIS — M79605 Pain in left leg: Secondary | ICD-10-CM | POA: Diagnosis not present

## 2016-03-21 DIAGNOSIS — R296 Repeated falls: Secondary | ICD-10-CM

## 2016-03-21 DIAGNOSIS — R2689 Other abnormalities of gait and mobility: Secondary | ICD-10-CM

## 2016-03-21 DIAGNOSIS — R262 Difficulty in walking, not elsewhere classified: Secondary | ICD-10-CM

## 2016-03-21 DIAGNOSIS — M79604 Pain in right leg: Secondary | ICD-10-CM | POA: Diagnosis not present

## 2016-03-22 NOTE — Therapy (Signed)
[facility] [address] [place], [place], [address] Phone: [phone]   Fax:  [phone]  Physical Therapy Treatment  Patient Details  Name: Yvonne [name] MRN: [id number] Date of Birth: [birth year] Referring Provider: Darron Doom, MD  Encounter Date: 03/21/2016      PT End of Session - 03/21/16 1324    Visit Number 7   Number of Visits 17   Date for PT Re-Evaluation 04/27/16   Authorization Type Medicare G Code and Progress Note   PT Start Time 1319   PT Stop Time 1400   PT Time Calculation (min) 41 min   Activity Tolerance Patient tolerated treatment well;Patient limited by pain  increased  pain with standing/activity   Behavior During Therapy [facility] for tasks assessed/performed;Impulsive  impulsive at times      Past Medical History:  Diagnosis Date  . Allergy   . Hypertension   . Impaired functional mobility, balance, gait, and endurance 09/02/2015  . Mild cognitive impairment with memory loss 09/02/2015  . Stroke [facility])    pt was 40  . Thyroid disease     Past Surgical History:  Procedure Laterality Date  . ABDOMINAL HYSTERECTOMY    . BRAIN SURGERY    . TOE AMPUTATION  2013   2nd toe on right foot, hammer toe    There were no vitals filed for this visit.      Subjective Assessment - 03/21/16 1323    Subjective No new complaints. No falls or pain to report.    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 No/denies   Pain Score 0-No pain             [facility] Adult PT Treatment/Exercise - 03/21/16 1328      Transfers   Transfers Sit to Stand;Stand to [employer] Transfers   Sit to Stand 6: Modified independent (Device/Increase time);With upper extremity assist;With armrests;From  chair/3-in-1;From bed   Stand to Sit 6: Modified independent (Device/Increase time);With upper extremity assist;With armrests;To chair/3-in-1;To bed     Ambulation/Gait   Ambulation/Gait Yes   Ambulation/Gait Assistance 5: Supervision   Ambulation/Gait Assistance Details cues needed with all gait for increased step length on right side and for upright posture.    Ambulation Distance (Feet) 115 Feet  x1 cane; 220 x2   Assistive device Straight cane;Rollator   Gait Pattern Step-to pattern;Decreased step length - right;Decreased stance time - left;Lateral trunk lean to left;Trunk flexed;Narrow base of support;Poor foot clearance - right   Ambulation Surface Level;Indoor             PT Short Term Goals - 03/06/16 1650      PT SHORT TERM GOAL #1   Title Pt will be independent with intial HEP to address balance, flexibility, and weakness. (Target Date: 03/30/16)   Time 4   Period Weeks   Status On-going     PT SHORT TERM GOAL #2   Title Pt will be able to safely reach 4" without balance loss. (Target Date: 03/30/16)   Time 4   Period Weeks   Status On-going     PT SHORT TERM GOAL #3   Title Pt will be able to safely ambulate 150 ft with rollator. (Target Date: 03/30/16)   Time 4  Period Weeks   Status On-going     PT SHORT TERM GOAL #4   Title Pt verbalizes an understanding of modifying activity level including AD's for pain management. (Target Date: 03/30/16)   Time 4   Period Weeks   Status On-going           PT Long Term Goals - 03/06/16 1650      PT LONG TERM GOAL #1   Title Pt verbalizes and demonstrates understanding of ongoing HEP/fitness plan (Target Date: 04/27/16   Time 8   Period Weeks   Status On-going     PT LONG TERM GOAL #2   Title Pt reports pain increases less than 2 increments on the 0-10 scale with standing and gait activites. (Target Date: 04/27/16)   Time 8   Period Weeks   Status On-going     PT LONG TERM GOAL #3   Title Pt will  improve BERG Balance to > 40/56 to indicate lower fall risk. (Target Date:04/27/16)   Time 8   Period Weeks   Status On-going     PT LONG TERM GOAL #4   Title Pt will improve TUG score to < 16.5 seconds safely to indicate lower fall risk. (Target Date: 04/27/16)   Time 8   Period Weeks   Status On-going     PT LONG TERM GOAL #5   Title Pt will ambulate 300 ft including negotiating ramps and curbs with LRAD to enable community mobility. (Target Date: 04/27/16)   Time 8   Period Weeks   Status On-going     PT LONG TERM GOAL #6   Title Pt will be able to navigate 161ft around furniture without an AD mod I to enable household mobility. (Target Date: 04/27/16)   Time 8   Period Weeks   Status On-going            Plan - 03/21/16 1325    Clinical Impression Statement Today's session continued to address gait with straight cane vs rollator. Pt appears more symmetrical and balance with use of rollator, however continues to be reluctanct to use any AD stating "I just want to walk". Pt continues to be limited in acitivity by left hip and back pain. Does report decreased pain with use of AD (rollator>cane) in session. In standing pt present with leg length descrepancy with her weight centered on right side, left knee flexed, narrow base of support and her shoulder shifted toward left side. with cues/faciltiation/mirror feedback pt was able to achieve a more symmetrical posture, however reported feeling she was going to fall. Pt will need continued work on postural correction to hopefully carryover into improved balance and decreased pain. Pt should benefit from continued PT to progress toward unmet goals.                                                               Rehab Potential Good   Clinical Impairments Affecting Rehab Potential Hard of hearing, cognitive deficits, R eye blindness   PT Frequency 2x / week   PT Duration 8 weeks   PT Treatment/Interventions ADLs/Self Care Home  Management;Gait training;Stair training;Functional mobility training;Neuromuscular re-education;Balance training;Therapeutic exercise;Therapeutic activities;Manual techniques;Patient/family education;Orthotic Fit/Training;Visual/perceptual remediation/compensation;Energy conservation   PT Next Visit Plan STGs due next week (by 10/13/170. continue  to work on postural strengthening, weight shifting laterally -emphasis on right side, bil hip strengthening and balance activities.   PT Home Exercise Plan OTAGO   Consulted and Agree with Plan of Care Patient      Patient will benefit from skilled therapeutic intervention in order to improve the following deficits and impairments:  Abnormal gait, Decreased activity tolerance, Decreased balance, Decreased cognition, Decreased mobility, Decreased endurance, Decreased range of motion, Decreased knowledge of use of DME, Decreased strength, Impaired sensation, Impaired flexibility, Postural dysfunction, Impaired vision/preception, Pain  Visit Diagnosis: Other abnormalities of gait and mobility  Muscle weakness (generalized)  Unsteadiness on feet  Repeated falls  Abnormal posture  Difficulty in walking, not elsewhere classified     Problem List Patient Active Problem List   Diagnosis Date Noted  . Mild cognitive impairment with memory loss 09/02/2015  . Impaired functional mobility, balance, gait, and endurance 09/02/2015  . Hearing impairment 09/02/2015  . 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 Skin nodule 09/13/2014  . Osteopenia 08/30/2014  . Diffuse pain 04/07/2014  . Numbness in both legs 10/16/2012  . Constipation 06/04/2012  . Hypercholesteremia 09/05/2011  . Osteoarthritis 04/12/2011  . 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 Hypothyroidism 07/12/2009  . ANXIETY 07/12/2009  . INSOMNIA, PERSISTENT 07/12/2009  . Essential hypertension 07/12/2009  . CEREBROVASCULAR ACCIDENT, HX OF 07/12/2009    Willow Ora, PTA,  [facility] Outpatient Neuro [facility] [address], [place], [place] [address] [phone] 03/22/16, 12:59 PM   Name: ALLENE BALOUGH MRN: [id number] Date of Birth: [birth year]

## 2016-03-26 ENCOUNTER — Encounter: Payer: Self-pay | Admitting: Physical Therapy

## 2016-03-26 ENCOUNTER — Ambulatory Visit: Payer: Commercial Managed Care - HMO | Admitting: Physical Therapy

## 2016-03-26 DIAGNOSIS — R293 Abnormal posture: Secondary | ICD-10-CM | POA: Diagnosis not present

## 2016-03-26 DIAGNOSIS — M6281 Muscle weakness (generalized): Secondary | ICD-10-CM

## 2016-03-26 DIAGNOSIS — R2689 Other abnormalities of gait and mobility: Secondary | ICD-10-CM | POA: Diagnosis not present

## 2016-03-26 DIAGNOSIS — R296 Repeated falls: Secondary | ICD-10-CM | POA: Diagnosis not present

## 2016-03-26 DIAGNOSIS — R2681 Unsteadiness on feet: Secondary | ICD-10-CM

## 2016-03-26 DIAGNOSIS — R262 Difficulty in walking, not elsewhere classified: Secondary | ICD-10-CM | POA: Diagnosis not present

## 2016-03-26 DIAGNOSIS — M79605 Pain in left leg: Secondary | ICD-10-CM | POA: Diagnosis not present

## 2016-03-26 DIAGNOSIS — M79604 Pain in right leg: Secondary | ICD-10-CM | POA: Diagnosis not present

## 2016-03-27 NOTE — Therapy (Signed)
[facility] [address] [place], [place], [address] Phone: [phone]   Fax:  [phone]  Physical Therapy Treatment  Patient Details  Name: Yvonne [name] MRN: [id number] Date of Birth: 09/11/1940 Referring Provider: Tinnie Gens, MD  Encounter Date: 03/26/2016      PT End of Session - 03/26/16 1230    Visit Number 8   Number of Visits 17   Date for PT Re-Evaluation 04/27/16   Authorization Type Medicare G Code and Progress Note   PT Start Time 1146   PT Stop Time 1230   PT Time Calculation (min) 44 min   Equipment Utilized During Treatment Gait belt   Activity Tolerance Patient tolerated treatment well;Patient limited by pain  increased  pain with standing/activity   Behavior During Therapy [facility] for tasks assessed/performed;Impulsive  impulsive at times      Past Medical History:  Diagnosis Date  . Allergy   . Hypertension   . Impaired functional mobility, balance, gait, and endurance 09/02/2015  . Mild cognitive impairment with memory loss 09/02/2015  . Stroke [facility])    pt was 40  . Thyroid disease     Past Surgical History:  Procedure Laterality Date  . ABDOMINAL HYSTERECTOMY    . BRAIN SURGERY    . TOE AMPUTATION  2013   2nd toe on right foot, hammer toe    There were no vitals filed for this visit.      Subjective Assessment - 03/26/16 1149    Subjective No falls. She has not used rollator in last 2 weeks. Denies falls. She uses cane when later in day or tired but uses nothing to walk between rooms in homa.    Pertinent History CVA, brain tumor, HTN, 2nd toe amp on Rt foot, OA, osteopenia, mild cognitive impairment, hearing impairment, blind Rt eye   Limitations Walking;Standing   How long can you sit comfortably?     Patient Stated Goals "I want to get well and walk normally."   Currently in Pain? No/denies     Therapeutic Exercise: See pt education  Neuromuscular Re-Education: balance  & posture with mirror for visual feedback, tactile cues & verbal cues with UE support on counter. Alternate kicks forward and extension with upright posture.  Stepping to right & left with hip rotation and weight shift to change directions.   Gait Training with cane with quad tip & cane with std round tip. Pt ambulated 200' X 4 with cues on cane placement, upright posture (using mirror for feedback). Patient had improved right LE clearance in swing with step thru pattern with quad tip. Pt reports plans to purchase tip when able to budget.                             PT Education - 03/26/16 1230    Education provided Yes   Education Details classess and exercise room at [facility] as adjunct to HEP with recommendation to initiate participation while still under PT care but not on days she has PT. Modifying assistive devices for pain managment & fall prevention.    Person(s) Educated Patient   Methods Explanation;Verbal cues;Other (comment)  internet info printout   Comprehension Verbalized understanding;Need further instruction          PT Short Term Goals - 03/26/16 1230      PT SHORT TERM GOAL #1   Title Pt will be independent with intial  HEP to address balance, flexibility, and weakness. (Target Date: 03/30/16)   Baseline MET 03/26/2016   Time 4   Period Weeks   Status Achieved     PT SHORT TERM GOAL #2   Title Pt will be able to safely reach 4" without balance loss. (Target Date: 03/30/16)   Baseline MET 03/26/2016   Time 4   Period Weeks   Status Achieved     PT SHORT TERM GOAL #3   Title Pt will be able to safely ambulate 150 ft with rollator. (Target Date: 03/30/16)   Baseline MET 03/26/2016 with cane   Time 4   Period Weeks   Status Achieved     PT SHORT TERM GOAL #4   Title Pt verbalizes an understanding of modifying activity level including AD's for pain management. (Target Date: 03/30/16)   Baseline MET 03/26/2016   Time 4   Period  Weeks   Status Achieved           PT Long Term Goals - 03/06/16 1650      PT LONG TERM GOAL #1   Title Pt verbalizes and demonstrates understanding of ongoing HEP/fitness plan (Target Date: 04/27/16   Time 8   Period Weeks   Status On-going     PT LONG TERM GOAL #2   Title Pt reports pain increases less than 2 increments on the 0-10 scale with standing and gait activites. (Target Date: 04/27/16)   Time 8   Period Weeks   Status On-going     PT LONG TERM GOAL #3   Title Pt will improve BERG Balance to > 40/56 to indicate lower fall risk. (Target Date:04/27/16)   Time 8   Period Weeks   Status On-going     PT LONG TERM GOAL #4   Title Pt will improve TUG score to < 16.5 seconds safely to indicate lower fall risk. (Target Date: 04/27/16)   Time 8   Period Weeks   Status On-going     PT LONG TERM GOAL #5   Title Pt will ambulate 300 ft including negotiating ramps and curbs with LRAD to enable community mobility. (Target Date: 04/27/16)   Time 8   Period Weeks   Status On-going     PT LONG TERM GOAL #6   Title Pt will be able to navigate 130f around furniture without an AD mod I to enable household mobility. (Target Date: 04/27/16)   Time 8   Period Weeks   Status On-going               Plan - 03/26/16 1230    Clinical Impression Statement Patient met all STGs set at initial evaluation. Patient reports less balance losses and falls over last 2 weeks. Patient reports modifying assistive devices based on pain, fatigue and environment issues.    Rehab Potential Good   Clinical Impairments Affecting Rehab Potential Hard of hearing, cognitive deficits, R eye blindness   PT Frequency 2x / week   PT Duration 8 weeks   PT Treatment/Interventions ADLs/Self Care Home Management;Gait training;Stair training;Functional mobility training;Neuromuscular re-education;Balance training;Therapeutic exercise;Therapeutic activities;Manual techniques;Patient/family  education;Orthotic Fit/Training;Visual/perceptual remediation/compensation;Energy conservation   PT Next Visit Plan continue towards LTGs with gait & balance activities.   PT Home Exercise Plan OTAGO   Consulted and Agree with Plan of Care Patient      Patient will benefit from skilled therapeutic intervention in order to improve the following deficits and impairments:  Abnormal gait, Decreased activity tolerance, Decreased balance, Decreased  cognition, Decreased mobility, Decreased endurance, Decreased range of motion, Decreased knowledge of use of DME, Decreased strength, Impaired sensation, Impaired flexibility, Postural dysfunction, Impaired vision/preception, Pain  Visit Diagnosis: Other abnormalities of gait and mobility  Muscle weakness (generalized)  Unsteadiness on feet  Abnormal posture  Repeated falls     Problem List Patient Active Problem List   Diagnosis Date Noted  . Mild cognitive impairment with memory loss 09/02/2015  . Impaired functional mobility, balance, gait, and endurance 09/02/2015  . Hearing impairment 09/02/2015  . 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 Skin nodule 09/13/2014  . Osteopenia 08/30/2014  . Diffuse pain 04/07/2014  . Numbness in both legs 10/16/2012  . Constipation 06/04/2012  . Hypercholesteremia 09/05/2011  . Osteoarthritis 04/12/2011  . 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 Hypothyroidism 07/12/2009  . ANXIETY 07/12/2009  . INSOMNIA, PERSISTENT 07/12/2009  . Essential hypertension 07/12/2009  . CEREBROVASCULAR ACCIDENT, HX OF 07/12/2009    Jamey Reas PT, DPT 03/27/2016, 8:21 AM  [facility] [address] [place], [place], [address] Phone: [phone]   Fax:  [phone]  Name: Yvonne [name] MRN: [id number] Date of Birth: 1941/01/23

## 2016-03-28 ENCOUNTER — Ambulatory Visit: Payer: Commercial Managed Care - HMO | Admitting: Physical Therapy

## 2016-03-28 ENCOUNTER — Encounter: Payer: Self-pay | Admitting: Physical Therapy

## 2016-03-28 DIAGNOSIS — M79605 Pain in left leg: Secondary | ICD-10-CM | POA: Diagnosis not present

## 2016-03-28 DIAGNOSIS — R293 Abnormal posture: Secondary | ICD-10-CM

## 2016-03-28 DIAGNOSIS — R2689 Other abnormalities of gait and mobility: Secondary | ICD-10-CM | POA: Diagnosis not present

## 2016-03-28 DIAGNOSIS — R296 Repeated falls: Secondary | ICD-10-CM | POA: Diagnosis not present

## 2016-03-28 DIAGNOSIS — R2681 Unsteadiness on feet: Secondary | ICD-10-CM | POA: Diagnosis not present

## 2016-03-28 DIAGNOSIS — M79604 Pain in right leg: Secondary | ICD-10-CM | POA: Diagnosis not present

## 2016-03-28 DIAGNOSIS — R262 Difficulty in walking, not elsewhere classified: Secondary | ICD-10-CM

## 2016-03-28 DIAGNOSIS — M6281 Muscle weakness (generalized): Secondary | ICD-10-CM

## 2016-03-29 NOTE — Therapy (Signed)
[facility] [address] [place], [place], [address] Phone: [phone]   Fax:  [phone]  Physical Therapy Treatment  Patient Details  Name: Yvonne [name] MRN: [id number] Date of Birth: 1941/01/29 Referring Provider: Darron Doom, MD  Encounter Date: 03/28/2016      PT End of Session - 03/28/16 1323    Visit Number 9   Number of Visits 17   Date for PT Re-Evaluation 04/27/16   Authorization Type Medicare G Code and Progress Note   PT Start Time 9528   PT Stop Time 1400   PT Time Calculation (min) 42 min   Equipment Utilized During Treatment Gait belt   Activity Tolerance Patient tolerated treatment well;Patient limited by pain  increased  pain with standing/activity   Behavior During Therapy [facility] for tasks assessed/performed;Impulsive  impulsive at times      Past Medical History:  Diagnosis Date  . Allergy   . Hypertension   . Impaired functional mobility, balance, gait, and endurance 09/02/2015  . Mild cognitive impairment with memory loss 09/02/2015  . Stroke [facility])    pt was 40  . Thyroid disease     Past Surgical History:  Procedure Laterality Date  . ABDOMINAL HYSTERECTOMY    . BRAIN SURGERY    . TOE AMPUTATION  2013   2nd toe on right foot, hammer toe    There were no vitals filed for this visit.      Subjective Assessment - 03/28/16 1321    Subjective No falls. Reports the numbing pain in her legs comes and goes depending on her activity level.    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 Yes   Pain Score 3    Pain Location Generalized  both legs   Pain Descriptors / Indicators Numbness   Pain Type Chronic pain   Pain Onset More than a month ago   Pain Frequency  Intermittent   Aggravating Factors  prolonged sitting, increased activity   Pain Relieving Factors resting, changing positions             [facility] Adult PT Treatment/Exercise - 03/28/16 1325      Transfers   Transfers Sit to Stand;Stand to Sit   Sit to Stand 6: Modified independent (Device/Increase time);With upper extremity assist;With armrests;From chair/3-in-1;From bed   Stand to Sit 6: Modified independent (Device/Increase time);With upper extremity assist;With armrests;To chair/3-in-1;To bed     Ambulation/Gait   Ambulation/Gait Yes   Ambulation/Gait Assistance 5: Supervision;4: Min guard   Ambulation/Gait Assistance Details cues for increased right LE step length for a more equal step length with gait   Ambulation Distance (Feet) 220 Feet  x 2   Assistive device Straight cane  with rubber quad tip   Gait Pattern Step-through pattern;Decreased stride length;Decreased step length - right;Decreased step length - left;Decreased stance time - left;Lateral trunk lean to left;Trunk flexed;Narrow base of support   Ambulation Surface Level;Indoor     High Level Balance   High Level Balance Activities Marching forwards;Marching backwards   High Level Balance Comments in parallel bars x 3 laps each with mirror feedback to assist with posture, min guard assist. cues on ex form, technique and posture.      Knee/Hip Exercises: Standing   Hip Flexion  AROM;Stengthening;Both;1 set;10 reps;Knee straight;Limitations   Hip Flexion Limitations with UE support: alternating legs x 10 reps each side   Hip Abduction AROM;Stengthening;Both;1 set;10 reps;Knee straight;Limitations   Abduction Limitations with UE support, alternating legs x 10 reps each side   Hip Extension AROM;Stengthening;Both;1 set;10 reps;Knee straight;Limitations   Extension Limitations with UE support: alternating legs, 10 reps each side             PT Short Term Goals - 03/26/16 1230      PT SHORT TERM GOAL #1    Title Pt will be independent with intial HEP to address balance, flexibility, and weakness. (Target Date: 03/30/16)   Baseline MET 03/26/2016   Time 4   Period Weeks   Status Achieved     PT SHORT TERM GOAL #2   Title Pt will be able to safely reach 4" without balance loss. (Target Date: 03/30/16)   Baseline MET 03/26/2016   Time 4   Period Weeks   Status Achieved     PT SHORT TERM GOAL #3   Title Pt will be able to safely ambulate 150 ft with rollator. (Target Date: 03/30/16)   Baseline MET 03/26/2016 with cane   Time 4   Period Weeks   Status Achieved     PT SHORT TERM GOAL #4   Title Pt verbalizes an understanding of modifying activity level including AD's for pain management. (Target Date: 03/30/16)   Baseline MET 03/26/2016   Time 4   Period Weeks   Status Achieved           PT Long Term Goals - 03/06/16 1650      PT LONG TERM GOAL #1   Title Pt verbalizes and demonstrates understanding of ongoing HEP/fitness plan (Target Date: 04/27/16   Time 8   Period Weeks   Status On-going     PT LONG TERM GOAL #2   Title Pt reports pain increases less than 2 increments on the 0-10 scale with standing and gait activites. (Target Date: 04/27/16)   Time 8   Period Weeks   Status On-going     PT LONG TERM GOAL #3   Title Pt will improve BERG Balance to > 40/56 to indicate lower fall risk. (Target Date:04/27/16)   Time 8   Period Weeks   Status On-going     PT LONG TERM GOAL #4   Title Pt will improve TUG score to < 16.5 seconds safely to indicate lower fall risk. (Target Date: 04/27/16)   Time 8   Period Weeks   Status On-going     PT LONG TERM GOAL #5   Title Pt will ambulate 300 ft including negotiating ramps and curbs with LRAD to enable community mobility. (Target Date: 04/27/16)   Time 8   Period Weeks   Status On-going     PT LONG TERM GOAL #6   Title Pt will be able to navigate 156ft around furniture without an AD mod I to enable household mobility. (Target  Date: 04/27/16)   Time 8   Period Weeks   Status On-going           Plan - 03/28/16 1323    Clinical Impression Statement Today's skilled session continued to address gait with symmetrical posture using a straight cane, LE strengthening and high level balance activities with only fatigue reported. Pt continues to need cues and responds well to mirror feedback for midline/mroe symmetrial posture. Pt is making steady progress toward goals and should benefit from  continued PT to progress toward unmet goals.   Rehab Potential Good   Clinical Impairments Affecting Rehab Potential Hard of hearing, cognitive deficits, R eye blindness   PT Frequency 2x / week   PT Duration 8 weeks   PT Treatment/Interventions ADLs/Self Care Home Management;Gait training;Stair training;Functional mobility training;Neuromuscular re-education;Balance training;Therapeutic exercise;Therapeutic activities;Manual techniques;Patient/family education;Orthotic Fit/Training;Visual/perceptual remediation/compensation;Energy conservation   PT Next Visit Plan G-code at next visit; continue towards LTGs with gait & balance activities.   PT Home Exercise Plan OTAGO   Consulted and Agree with Plan of Care Patient      Patient will benefit from skilled therapeutic intervention in order to improve the following deficits and impairments:  Abnormal gait, Decreased activity tolerance, Decreased balance, Decreased cognition, Decreased mobility, Decreased endurance, Decreased range of motion, Decreased knowledge of use of DME, Decreased strength, Impaired sensation, Impaired flexibility, Postural dysfunction, Impaired vision/preception, Pain  Visit Diagnosis: Other abnormalities of gait and mobility  Muscle weakness (generalized)  Unsteadiness on feet  Abnormal posture  Repeated falls  Difficulty in walking, not elsewhere classified     Problem List Patient Active Problem List   Diagnosis Date Noted  . Mild cognitive  impairment with memory loss 09/02/2015  . Impaired functional mobility, balance, gait, and endurance 09/02/2015  . Hearing impairment 09/02/2015  . 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 Skin nodule 09/13/2014  . Osteopenia 08/30/2014  . Diffuse pain 04/07/2014  . Numbness in both legs 10/16/2012  . Constipation 06/04/2012  . Hypercholesteremia 09/05/2011  . Osteoarthritis 04/12/2011  . 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 Hypothyroidism 07/12/2009  . ANXIETY 07/12/2009  . INSOMNIA, PERSISTENT 07/12/2009  . Essential hypertension 07/12/2009  . CEREBROVASCULAR ACCIDENT, HX OF 07/12/2009    Willow Ora, PTA, [facility] Outpatient Neuro [facility] [address] [place], [place] [address] [phone] 03/29/16, 12:37 PM   Name: NATAHSA MARIAN MRN: [id number] Date of Birth: [birth year]

## 2016-04-02 ENCOUNTER — Encounter: Payer: Self-pay | Admitting: Physical Therapy

## 2016-04-02 ENCOUNTER — Ambulatory Visit: Payer: Commercial Managed Care - HMO | Admitting: Physical Therapy

## 2016-04-02 DIAGNOSIS — R293 Abnormal posture: Secondary | ICD-10-CM

## 2016-04-02 DIAGNOSIS — M79605 Pain in left leg: Secondary | ICD-10-CM | POA: Diagnosis not present

## 2016-04-02 DIAGNOSIS — R262 Difficulty in walking, not elsewhere classified: Secondary | ICD-10-CM

## 2016-04-02 DIAGNOSIS — R296 Repeated falls: Secondary | ICD-10-CM | POA: Diagnosis not present

## 2016-04-02 DIAGNOSIS — M79604 Pain in right leg: Secondary | ICD-10-CM | POA: Diagnosis not present

## 2016-04-02 DIAGNOSIS — M6281 Muscle weakness (generalized): Secondary | ICD-10-CM

## 2016-04-02 DIAGNOSIS — R2681 Unsteadiness on feet: Secondary | ICD-10-CM | POA: Diagnosis not present

## 2016-04-02 DIAGNOSIS — R2689 Other abnormalities of gait and mobility: Secondary | ICD-10-CM

## 2016-04-02 NOTE — Therapy (Signed)
[place] [place] [address] [place], [place], [address] Phone: [phone]   Fax:  [phone]  Physical Therapy Treatment  Patient Details  Name: Yvonne [name] MRN: [id number] Date of Birth: 1941/04/07 Referring Provider: Darron Doom, MD  Encounter Date: 04/02/2016      PT End of Session - 04/02/16 1322    Visit Number 10   Number of Visits 17   Date for PT Re-Evaluation 04/27/16   Authorization Type Medicare G Code and Progress Note   PT Start Time 8416   PT Stop Time 1400   PT Time Calculation (min) 42 min   Equipment Utilized During Treatment Gait belt   Activity Tolerance Patient tolerated treatment well;Patient limited by pain  increased  pain with standing/activity   Behavior During Therapy [facility] for tasks assessed/performed;Impulsive  impulsive at times      Past Medical History:  Diagnosis Date  . Allergy   . Hypertension   . Impaired functional mobility, balance, gait, and endurance 09/02/2015  . Mild cognitive impairment with memory loss 09/02/2015  . Stroke [facility])    pt was 40  . Thyroid disease     Past Surgical History:  Procedure Laterality Date  . ABDOMINAL HYSTERECTOMY    . BRAIN SURGERY    . TOE AMPUTATION  2013   2nd toe on right foot, hammer toe    There were no vitals filed for this visit.      Subjective Assessment - 04/02/16 1322    Subjective No new complaints. No falls or pain to report.    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 No/denies   Pain Score 0-No pain            [facility] PT Assessment - 04/02/16 1324      Berg Balance Test   Sit to Stand Able to stand without using hands and stabilize independently   Standing Unsupported Able to stand safely 2  minutes   Sitting with Back Unsupported but Feet Supported on Floor or Stool Able to sit safely and securely 2 minutes   Stand to Sit Sits safely with minimal use of hands   Transfers Able to transfer safely, minor use of hands   Standing Unsupported with Eyes Closed Able to stand 10 seconds safely   Standing Ubsupported with Feet Together Able to place feet together independently and stand 1 minute safely   From Standing, Reach Forward with Outstretched Arm Can reach forward >12 cm safely (5")  8 inches   From Standing Position, Pick up Object from Floor Able to pick up shoe safely and easily   From Standing Position, Turn to Look Behind Over each Shoulder Looks behind one side only/other side shows less weight shift  right > left   Turn 360 Degrees Able to turn 360 degrees safely in 4 seconds or less   Standing Unsupported, Alternately Place Feet on Step/Stool Able to complete 4 steps without aid or supervision   Standing Unsupported, One Foot in Front Able to plae foot ahead of the other independently and hold 30 seconds   Standing on One Leg Able to lift leg independently and hold equal to or more than 3 seconds   Total Score 49   Berg comment: 49= 46-51 moderate  risk of falling (>50%) category            [facility] Adult PT Treatment/Exercise - 04/02/16 1339      Transfers   Transfers Sit to Stand;Stand to [employer] to Stand 6: Modified independent (Device/Increase time)   Stand to Sit 6: Modified independent (Device/Increase time)     Ambulation/Gait   Ambulation/Gait Yes   Ambulation/Gait Assistance 5: Supervision   Ambulation/Gait Assistance Details cues for posture, increased right step length and occasional cues for cane placement/position with gait   Ambulation Distance (Feet) 220 Feet  x1, 335 x1 indoors; 320 in/outdoors   Assistive device Straight cane  with rubber quad tip   Gait Pattern Step-through pattern;Decreased stride length;Decreased step length -  right;Decreased step length - left;Decreased stance time - left;Lateral trunk lean to left;Trunk flexed;Narrow base of support   Ambulation Surface Level;Indoor;Unlevel;Outdoor;Paved   Ramp Other (comment)  min guard assist   Ramp Details (indicate cue type and reason) x 1 on indoor ramp and x 1 on outdoor inclines/declines with cane, cues on posture and step length   Curb Other (comment)  min guard assist   Curb Details (indicate cue type and reason) x 1 on indoor and x1 on outdoors curbs with cane, cues on sequencing and technique            PT Short Term Goals - 03/26/16 1230      PT SHORT TERM GOAL #1   Title Pt will be independent with intial HEP to address balance, flexibility, and weakness. (Target Date: 03/30/16)   Baseline MET 03/26/2016   Time 4   Period Weeks   Status Achieved     PT SHORT TERM GOAL #2   Title Pt will be able to safely reach 4" without balance loss. (Target Date: 03/30/16)   Baseline MET 03/26/2016   Time 4   Period Weeks   Status Achieved     PT SHORT TERM GOAL #3   Title Pt will be able to safely ambulate 150 ft with rollator. (Target Date: 03/30/16)   Baseline MET 03/26/2016 with cane   Time 4   Period Weeks   Status Achieved     PT SHORT TERM GOAL #4   Title Pt verbalizes an understanding of modifying activity level including AD's for pain management. (Target Date: 03/30/16)   Baseline MET 03/26/2016   Time 4   Period Weeks   Status Achieved           PT Long Term Goals - 04/02/16 1339      PT LONG TERM GOAL #1   Title Pt verbalizes and demonstrates understanding of ongoing HEP/fitness plan (Target Date: 04/27/16   Time 8   Period Weeks   Status On-going     PT LONG TERM GOAL #2   Title Pt reports pain increases less than 2 increments on the 0-10 scale with standing and gait activites. (Target Date: 04/27/16)   Time 8   Period Weeks   Status On-going     PT LONG TERM GOAL #3   Title Pt will improve BERG Balance to >/= 52/56  to indicate lower fall risk. (Target Date:04/27/16)   Baseline 04/02/16: 49/56 scored today  Jamey Reas, PT revised to higher score on 04/03/2016   Status Revised     PT LONG TERM GOAL #4   Title Pt will improve TUG score to < 16.5 seconds safely to indicate lower fall risk. (Target Date: 04/27/16)   Time 8  Period Weeks   Status On-going     PT LONG TERM GOAL #5   Title Pt will ambulate 300 ft including negotiating ramps and curbs with LRAD to enable community mobility. (Target Date: 04/27/16)   Time 8   Period Weeks   Status On-going     PT LONG TERM GOAL #6   Title Pt will be able to navigate 122f around furniture without an AD mod I to enable household mobility. (Target Date: 04/27/16)   Time 8   Period Weeks   Status On-going               Plan - 1Nov 01, 20171323    Clinical Impression Statement Pt with improved Berg balance test score today, up to 49/56 on retesting with LTG met. Pt is making steady progress toward all other goals as well and should benefit from continued PT to progress toward remaining unmet goals.    Rehab Potential Good   Clinical Impairments Affecting Rehab Potential Hard of hearing, cognitive deficits, R eye blindness   PT Frequency 2x / week   PT Duration 8 weeks   PT Treatment/Interventions ADLs/Self Care Home Management;Gait training;Stair training;Functional mobility training;Neuromuscular re-education;Balance training;Therapeutic exercise;Therapeutic activities;Manual techniques;Patient/family education;Orthotic Fit/Training;Visual/perceptual remediation/compensation;Energy conservation   PT Next Visit Plan  continue towards LTGs with gait & balance activities.   PT Home Exercise Plan OTAGO   Consulted and Agree with Plan of Care Patient      Patient will benefit from skilled therapeutic intervention in order to improve the following deficits and impairments:  Abnormal gait, Decreased activity tolerance, Decreased balance, Decreased  cognition, Decreased mobility, Decreased endurance, Decreased range of motion, Decreased knowledge of use of DME, Decreased strength, Impaired sensation, Impaired flexibility, Postural dysfunction, Impaired vision/preception, Pain  Visit Diagnosis: Other abnormalities of gait and mobility  Muscle weakness (generalized)  Unsteadiness on feet  Abnormal posture  Repeated falls  Difficulty in walking, not elsewhere classified       G-Codes - 111/01/20171338    Functional Assessment Tool Used Berg Balance 49/56   Functional Limitation Mobility: Walking and moving around   Mobility: Walking and Moving Around Current Status [phone] At least 40 percent but less than 60 percent impaired, limited or restricted   Mobility: Walking and Moving Around Goal Status [phone] At least 20 percent but less than 40 percent impaired, limited or restricted      Problem List Patient Active Problem List   Diagnosis Date Noted  . Mild cognitive impairment with memory loss 09/02/2015  . Impaired functional mobility, balance, gait, and endurance 09/02/2015  . Hearing impairment 09/02/2015  . 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 Skin nodule 09/13/2014  . Osteopenia 08/30/2014  . Diffuse pain 04/07/2014  . Numbness in both legs 10/16/2012  . Constipation 06/04/2012  . Hypercholesteremia 09/05/2011  . Osteoarthritis 04/12/2011  . 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 Hypothyroidism 07/12/2009  . ANXIETY 07/12/2009  . INSOMNIA, PERSISTENT 07/12/2009  . Essential hypertension 07/12/2009  . CEREBROVASCULAR ACCIDENT, HX OF 07/12/2009    KWillow Ora PTA, [address] [place] [phone]/17/17, 9:19 AM   Name: PBRIGIDA SCOTTIMRN: [id number] of Birth: 1June 17, 1942      G-Codes - 1Nov 01, [id number]   Functional Assessment Tool Used BMerrilee JanskyBalance 49/56   Functional Limitation Mobility: Walking and moving around    Mobility: Walking and Moving Around Current Status [phone] At least 40 percent but less  than 60 percent impaired, limited or restricted   Mobility: Walking and Moving Around Goal Status [phone] At least 20 percent but less than 40 percent impaired, limited or restricted     Physical Therapy Progress Note  Dates of Reporting Period: 02/28/2016 to 04/02/2016  Objective Reports of Subjective Statement: Patient reports improved balance with no falls in last few weeks.  Objective Measurements: see above  Goal Update: see above  Plan: continue established plan of care  Reason Skilled Services are Required: Patient requires skilled care to further progress balance and gait with minimal fall risk.   Jamey Reas, PT, DPT PT Specializing in [place] 04/03/16 9:27 AM Phone:  [phone]  Fax:  [phone] [place] [address] [place], [place] [address]

## 2016-04-04 ENCOUNTER — Ambulatory Visit: Payer: Commercial Managed Care - HMO | Admitting: Physical Therapy

## 2016-04-04 ENCOUNTER — Encounter: Payer: Self-pay | Admitting: Physical Therapy

## 2016-04-04 DIAGNOSIS — M79605 Pain in left leg: Secondary | ICD-10-CM | POA: Diagnosis not present

## 2016-04-04 DIAGNOSIS — M79604 Pain in right leg: Secondary | ICD-10-CM | POA: Diagnosis not present

## 2016-04-04 DIAGNOSIS — R262 Difficulty in walking, not elsewhere classified: Secondary | ICD-10-CM | POA: Diagnosis not present

## 2016-04-04 DIAGNOSIS — R296 Repeated falls: Secondary | ICD-10-CM | POA: Diagnosis not present

## 2016-04-04 DIAGNOSIS — R293 Abnormal posture: Secondary | ICD-10-CM | POA: Diagnosis not present

## 2016-04-04 DIAGNOSIS — M6281 Muscle weakness (generalized): Secondary | ICD-10-CM | POA: Diagnosis not present

## 2016-04-04 DIAGNOSIS — R2689 Other abnormalities of gait and mobility: Secondary | ICD-10-CM | POA: Diagnosis not present

## 2016-04-04 DIAGNOSIS — R2681 Unsteadiness on feet: Secondary | ICD-10-CM

## 2016-04-05 NOTE — Therapy (Signed)
[facility] [address] [place], [place], [address] Phone: [phone]   Fax:  [phone]  Physical Therapy Treatment  Patient Details  Name: Yvonne [name] MRN: [id number] Date of Birth: [birth year] Referring Provider: Darron Doom, MD  Encounter Date: 04/04/2016   04/04/16 1322  PT Visits / Re-Eval  Visit Number 11  Number of Visits 17  Date for PT Re-Evaluation 04/27/16  Authorization  Authorization Type Medicare G Code and Progress Note  PT Time Calculation  PT Start Time 1318  PT Stop Time 1400  PT Time Calculation (min) 42 min  PT - End of Session  Equipment Utilized During Treatment Gait belt  Activity Tolerance Patient tolerated treatment well;Patient limited by pain (increased pain with gait)  Behavior During Therapy [facility] for tasks assessed/performed;Impulsive (impulsive at times)     Past Medical History:  Diagnosis Date  . Allergy   . Hypertension   . Impaired functional mobility, balance, gait, and endurance 09/02/2015  . Mild cognitive impairment with memory loss 09/02/2015  . Stroke [facility])    pt was 40  . Thyroid disease     Past Surgical History:  Procedure Laterality Date  . ABDOMINAL HYSTERECTOMY    . BRAIN SURGERY    . TOE AMPUTATION  2013   2nd toe on right foot, hammer toe    There were no vitals filed for this visit.     04/04/16 1321  Symptoms/Limitations  Subjective No new complaints. No falls or pain to report.   Pertinent History CVA, brain tumor, HTN, 2nd toe amp on Rt foot, OA, osteopenia, mild cognitive impairment, hearing impairment, blind Rt eye  Limitations Walking;Standing  Patient Stated Goals "I want to get well and walk normally."  Pain Assessment  Currently in Pain? No/denies  Pain Score 0      04/04/16 1323  Transfers  Transfers Sit to Stand;Stand to Sit  Sit to Stand 6: Modified independent (Device/Increase time)  Stand to Sit 6: Modified independent  (Device/Increase time)  Ambulation/Gait  Ambulation/Gait Yes  Ambulation/Gait Assistance 4: Min guard;5: Supervision;4: Min assist (min guard/min assist on outdoor surfaces for safety, )  Ambulation/Gait Assistance Details increased assistance needed on outdoor surfaces toward end of 1st gait rep due to fatigue, min guard assist again once pt has short rest break propped on car before continuing.   Ambulation Distance (Feet) 260 Feet (x1, 116 x1 in/outdoor; 120 indoors only)  Assistive device Straight cane (rubber quad tip)  Gait Pattern Step-through pattern;Decreased stride length;Decreased step length - right;Decreased step length - left;Decreased stance time - left;Lateral trunk lean to left;Trunk flexed;Narrow base of support  Ambulation Surface Level;Unlevel;Indoor;Outdoor;Paved;Grass  Ramp 5: Supervision  Ramp Details (indicate cue type and reason) x1 indoors, x2 outdoors inclines/declines with cane, min guard assist with last outdoor one due to fatigue   Curb Other (comment) (min guard assist)  Curb Details (indicate cue type and reason) x1 indoors, min guard assist due to toe catching with ascending curb  Exercises  Other Exercises  seated on pball: bouncing x 1 minute with emphasis on tall posture, pelvic rocking fwd/bwd and laterally, pelvic circles, alternating UE raises, alternating marching, alternating long arc quads and alternating combo contralaterl UE/leg raises. min guard assist to min assist for balance with mod <>max cues for posture and correct ex form/technique.  PT Short Term Goals - 03/26/16 1230      PT SHORT TERM GOAL #1   Title Pt will be independent with intial HEP to address balance, flexibility, and weakness. (Target Date: 03/30/16)   Baseline MET 03/26/2016   Time 4   Period Weeks   Status Achieved     PT SHORT TERM GOAL #2   Title Pt will be able to safely reach 4" without balance loss. (Target Date: 03/30/16)   Baseline  MET 03/26/2016   Time 4   Period Weeks   Status Achieved     PT SHORT TERM GOAL #3   Title Pt will be able to safely ambulate 150 ft with rollator. (Target Date: 03/30/16)   Baseline MET 03/26/2016 with cane   Time 4   Period Weeks   Status Achieved     PT SHORT TERM GOAL #4   Title Pt verbalizes an understanding of modifying activity level including AD's for pain management. (Target Date: 03/30/16)   Baseline MET 03/26/2016   Time 4   Period Weeks   Status Achieved           PT Long Term Goals - 04/02/16 1339      PT LONG TERM GOAL #1   Title Pt verbalizes and demonstrates understanding of ongoing HEP/fitness plan (Target Date: 04/27/16   Time 8   Period Weeks   Status On-going     PT LONG TERM GOAL #2   Title Pt reports pain increases less than 2 increments on the 0-10 scale with standing and gait activites. (Target Date: 04/27/16)   Time 8   Period Weeks   Status On-going     PT LONG TERM GOAL #3   Title Pt will improve BERG Balance to >/= 52/56 to indicate lower fall risk. (Target Date:04/27/16)   Baseline 04/02/16: 49/56 scored today  Vladimir Faster, PT revised to higher score on 04/03/2016   Status Revised     PT LONG TERM GOAL #4   Title Pt will improve TUG score to < 16.5 seconds safely to indicate lower fall risk. (Target Date: 04/27/16)   Time 8   Period Weeks   Status On-going     PT LONG TERM GOAL #5   Title Pt will ambulate 300 ft including negotiating ramps and curbs with LRAD to enable community mobility. (Target Date: 04/27/16)   Time 8   Period Weeks   Status On-going     PT LONG TERM GOAL #6   Title Pt will be able to navigate 126ft around furniture without an AD mod I to enable household mobility. (Target Date: 04/27/16)   Time 8   Period Weeks   Status On-going        04/04/16 1322  Plan  Clinical Impression Statement today's skilled session continued to address postural strengthening/midline positioning with exercise and gait. Pt is  making steady progress and should benefit from continued PT to progress toward unmet goals.   Pt will benefit from skilled therapeutic intervention in order to improve on the following deficits Abnormal gait;Decreased activity tolerance;Decreased balance;Decreased cognition;Decreased mobility;Decreased endurance;Decreased range of motion;Decreased knowledge of use of DME;Decreased strength;Impaired sensation;Impaired flexibility;Postural dysfunction;Impaired vision/preception;Pain  Rehab Potential Good  Clinical Impairments Affecting Rehab Potential Hard of hearing, cognitive deficits, R eye blindness  PT Frequency 2x / week  PT Duration 8 weeks  PT Treatment/Interventions ADLs/Self Care Home Management;Gait training;Stair training;Functional mobility training;Neuromuscular re-education;Balance training;Therapeutic exercise;Therapeutic activities;Manual techniques;Patient/family education;Orthotic Fit/Training;Visual/perceptual remediation/compensation;Energy conservation  PT Next Visit Plan  continue towards LTGs with gait & balance activities.  PT Home Exercise Plan OTAGO  Consulted and Agree with Plan of Care Patient       Patient will benefit from skilled therapeutic intervention in order to improve the following deficits and impairments:  Abnormal gait, Decreased activity tolerance, Decreased balance, Decreased cognition, Decreased mobility, Decreased endurance, Decreased range of motion, Decreased knowledge of use of DME, Decreased strength, Impaired sensation, Impaired flexibility, Postural dysfunction, Impaired vision/preception, Pain  Visit Diagnosis: Other abnormalities of gait and mobility  Muscle weakness (generalized)  Unsteadiness on feet  Abnormal posture  Repeated falls  Difficulty in walking, not elsewhere classified     Problem List Patient Active Problem List   Diagnosis Date Noted  . Mild cognitive impairment with memory loss 09/02/2015  . Impaired functional  mobility, balance, gait, and endurance 09/02/2015  . Hearing impairment 09/02/2015  . 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 Skin nodule 09/13/2014  . Osteopenia 08/30/2014  . Diffuse pain 04/07/2014  . Numbness in both legs 10/16/2012  . Constipation 06/04/2012  . Hypercholesteremia 09/05/2011  . Osteoarthritis 04/12/2011  . 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 Hypothyroidism 07/12/2009  . ANXIETY 07/12/2009  . INSOMNIA, PERSISTENT 07/12/2009  . Essential hypertension 07/12/2009  . CEREBROVASCULAR ACCIDENT, HX OF 07/12/2009    Willow Ora, PTA, [facility] [address], [place], [place] [address] [phone] 04/05/16, 9:45 PM   Name: Yvonne [name] MRN: [id number] Date of Birth: Aug 20, 1940

## 2016-04-09 ENCOUNTER — Encounter: Payer: Self-pay | Admitting: Physical Therapy

## 2016-04-09 ENCOUNTER — Ambulatory Visit (INDEPENDENT_AMBULATORY_CARE_PROVIDER_SITE_OTHER): Payer: Commercial Managed Care - HMO | Admitting: Family Medicine

## 2016-04-09 ENCOUNTER — Ambulatory Visit: Payer: Commercial Managed Care - HMO | Admitting: Physical Therapy

## 2016-04-09 ENCOUNTER — Encounter: Payer: Self-pay | Admitting: Family Medicine

## 2016-04-09 VITALS — BP 139/66 | HR 80 | Temp 98.3°F | Wt 150.0 lb

## 2016-04-09 DIAGNOSIS — R2689 Other abnormalities of gait and mobility: Secondary | ICD-10-CM

## 2016-04-09 DIAGNOSIS — Z23 Encounter for immunization: Secondary | ICD-10-CM | POA: Diagnosis not present

## 2016-04-09 DIAGNOSIS — Z8673 Personal history of transient ischemic attack (TIA), and cerebral infarction without residual deficits: Secondary | ICD-10-CM

## 2016-04-09 DIAGNOSIS — R200 Anesthesia of skin: Secondary | ICD-10-CM | POA: Diagnosis not present

## 2016-04-09 DIAGNOSIS — M79605 Pain in left leg: Secondary | ICD-10-CM | POA: Diagnosis not present

## 2016-04-09 DIAGNOSIS — M79604 Pain in right leg: Secondary | ICD-10-CM | POA: Diagnosis not present

## 2016-04-09 DIAGNOSIS — I10 Essential (primary) hypertension: Secondary | ICD-10-CM | POA: Diagnosis not present

## 2016-04-09 DIAGNOSIS — M6281 Muscle weakness (generalized): Secondary | ICD-10-CM | POA: Diagnosis not present

## 2016-04-09 DIAGNOSIS — R262 Difficulty in walking, not elsewhere classified: Secondary | ICD-10-CM

## 2016-04-09 DIAGNOSIS — R296 Repeated falls: Secondary | ICD-10-CM

## 2016-04-09 DIAGNOSIS — R293 Abnormal posture: Secondary | ICD-10-CM | POA: Diagnosis not present

## 2016-04-09 DIAGNOSIS — R2681 Unsteadiness on feet: Secondary | ICD-10-CM | POA: Diagnosis not present

## 2016-04-09 NOTE — Therapy (Signed)
[facility] [address] [place], [place], [address] Phone: [phone]   Fax:  [phone]  Physical Therapy Treatment  Patient Details  Name: Yvonne [name] MRN: [id number] Date of Birth: 1940/11/19 Referring Provider: Darron Doom, MD  Encounter Date: 04/09/2016      PT End of Session - 04/09/16 1327    Visit Number 12   Number of Visits 17   Date for PT Re-Evaluation 04/27/16   Authorization Type Medicare G Code and Progress Note   PT Start Time 1319   PT Stop Time 1400   PT Time Calculation (min) 41 min   Equipment Utilized During Treatment Gait belt   Activity Tolerance Patient tolerated treatment well;Patient limited by pain  increased  pain with standing/activity   Behavior During Therapy [facility] for tasks assessed/performed;Impulsive  impulsive at times      Past Medical History:  Diagnosis Date  . Allergy   . Hypertension   . Impaired functional mobility, balance, gait, and endurance 09/02/2015  . Mild cognitive impairment with memory loss 09/02/2015  . Stroke [facility])    pt was 40  . Thyroid disease     Past Surgical History:  Procedure Laterality Date  . ABDOMINAL HYSTERECTOMY    . BRAIN SURGERY    . TOE AMPUTATION  2013   2nd toe on right foot, hammer toe    There were no vitals filed for this visit.      Subjective Assessment - 04/09/16 1323    Subjective No falls or pain to report. Tried her treadmill over the weekend which does not power on, so she had to manually move it with her legs and arm's asssiting on handle. Was very sore  from ankle up back of legs to hamstrings. Plans to not use it again until she gets the piece needed to power it on. Does have a seated stepper/bike machines that she can use instead.                                        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 No/denies   Pain Score 0-No pain             [facility] Adult PT Treatment/Exercise - 04/09/16 1328      Transfers   Transfers Sit to Stand;Stand to Sit   Sit to Stand 6: Modified independent (Device/Increase time)   Stand to Sit 6: Modified independent (Device/Increase time)     Ambulation/Gait   Ambulation/Gait Yes   Ambulation/Gait Assistance 5: Supervision;4: Min guard  min guard with no AD   Ambulation/Gait Assistance Details cues on posture, to increase right step length and for cane use/placement with gait   Ambulation Distance (Feet) 440 Feet  x1 with cane, 230 x1 no AD   Assistive device Straight cane;None  with rubber quad tip   Gait Pattern Step-through pattern;Decreased stride length;Decreased step length - right;Decreased step length - left;Decreased stance time - left;Lateral trunk lean to left;Trunk flexed;Narrow base of support   Ambulation Surface Level;Indoor     Neuro Re-ed    Neuro Re-ed Details  2 tall cones on blue mat with straight cane assist  for balance: alternating fwd toe taps to each  and alternating cross toe taps to each x 10 reps bil legs      Knee/Hip Exercises: Aerobic   Nustep bil UE/LE level 3.0 x 8 minutes with goal >/= 60 steps per minute for strengthening and activity tolerance.             PT Short Term Goals - 03/26/16 1230      PT SHORT TERM GOAL #1   Title Pt will be independent with intial HEP to address balance, flexibility, and weakness. (Target Date: 03/30/16)   Baseline MET 03/26/2016   Time 4   Period Weeks   Status Achieved     PT SHORT TERM GOAL #2   Title Pt will be able to safely reach 4" without balance loss. (Target Date: 03/30/16)   Baseline MET 03/26/2016   Time 4   Period Weeks   Status Achieved     PT SHORT TERM GOAL #3   Title Pt will be able to safely ambulate 150 ft with rollator. (Target Date:  03/30/16)   Baseline MET 03/26/2016 with cane   Time 4   Period Weeks   Status Achieved     PT SHORT TERM GOAL #4   Title Pt verbalizes an understanding of modifying activity level including AD's for pain management. (Target Date: 03/30/16)   Baseline MET 03/26/2016   Time 4   Period Weeks   Status Achieved           PT Long Term Goals - 04/02/16 1339      PT LONG TERM GOAL #1   Title Pt verbalizes and demonstrates understanding of ongoing HEP/fitness plan (Target Date: 04/27/16   Time 8   Period Weeks   Status On-going     PT LONG TERM GOAL #2   Title Pt reports pain increases less than 2 increments on the 0-10 scale with standing and gait activites. (Target Date: 04/27/16)   Time 8   Period Weeks   Status On-going     PT LONG TERM GOAL #3   Title Pt will improve BERG Balance to >/= 52/56 to indicate lower fall risk. (Target Date:04/27/16)   Baseline 04/02/16: 49/56 scored today  Jamey Reas, PT revised to higher score on 04/03/2016   Status Revised     PT LONG TERM GOAL #4   Title Pt will improve TUG score to < 16.5 seconds safely to indicate lower fall risk. (Target Date: 04/27/16)   Time 8   Period Weeks   Status On-going     PT LONG TERM GOAL #5   Title Pt will ambulate 300 ft including negotiating ramps and curbs with LRAD to enable community mobility. (Target Date: 04/27/16)   Time 8   Period Weeks   Status On-going     PT LONG TERM GOAL #6   Title Pt will be able to navigate 154f around furniture without an AD mod I to enable household mobility. (Target Date: 04/27/16)   Time 8   Period Weeks   Status On-going           Plan - 04/09/16 1327    Clinical Impression Statement today's skilled session continued to address gait, balance and strengthening working towards [place] Pt continues to demo decreased postural alignment and fatigues quickly with activity needed short rest breaks. Pt is making steady progress toward goals and should benefit from  continued PT to progress toward unmet goals.    Rehab Potential Good  Clinical Impairments Affecting Rehab Potential Hard of hearing, cognitive deficits, R eye blindness   PT Frequency 2x / week   PT Duration 8 weeks   PT Treatment/Interventions ADLs/Self Care Home Management;Gait training;Stair training;Functional mobility training;Neuromuscular re-education;Balance training;Therapeutic exercise;Therapeutic activities;Manual techniques;Patient/family education;Orthotic Fit/Training;Visual/perceptual remediation/compensation;Energy conservation   PT Next Visit Plan  continue towards LTGs with gait & balance activities.   PT Home Exercise Plan OTAGO   Consulted and Agree with Plan of Care Patient      Patient will benefit from skilled therapeutic intervention in order to improve the following deficits and impairments:  Abnormal gait, Decreased activity tolerance, Decreased balance, Decreased cognition, Decreased mobility, Decreased endurance, Decreased range of motion, Decreased knowledge of use of DME, Decreased strength, Impaired sensation, Impaired flexibility, Postural dysfunction, Impaired vision/preception, Pain  Visit Diagnosis: Other abnormalities of gait and mobility  Muscle weakness (generalized)  Unsteadiness on feet  Abnormal posture  Repeated falls  Difficulty in walking, not elsewhere classified     Problem List Patient Active Problem List   Diagnosis Date Noted  . Mild cognitive impairment with memory loss 09/02/2015  . Impaired functional mobility, balance, gait, and endurance 09/02/2015  . Hearing impairment 09/02/2015  . 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 Skin nodule 09/13/2014  . Osteopenia 08/30/2014  . Diffuse pain 04/07/2014  . Numbness in both legs 10/16/2012  . Constipation 06/04/2012  . Hypercholesteremia 09/05/2011  . Osteoarthritis 04/12/2011  . 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 Hypothyroidism 07/12/2009  . ANXIETY  07/12/2009  . INSOMNIA, PERSISTENT 07/12/2009  . Essential hypertension 07/12/2009  . History of cerebrovascular accident 07/12/2009    Willow Ora, PTA, [place] [address] [place], [place] [address] [phone] 04/09/16, 2:59 PM   Name: AALANI AIKENS MRN: [id number] Date of Birth: May 29, 1941

## 2016-04-09 NOTE — Assessment & Plan Note (Signed)
If no answers on brain MRI--consider Lumbar MRI to look for neurogenic claudication.

## 2016-04-09 NOTE — Patient Instructions (Signed)
Advance Directive Advance directives are the legal documents that allow you to make choices about your health care and medical treatment if you cannot speak for yourself. Advance directives are a way for you to communicate your wishes to family, friends, and health care providers. The specified people can then convey your decisions about end-of-life care to avoid confusion if you should become unable to communicate. Ideally, the process of discussing and writing advance directives should happen over time rather than making decisions all at once. Advance directives can be modified as your situation changes, and you can change your mind at any time, even after you have signed the advance directives. Each state has its own laws regarding advance directives. You may want to check with your health care provider, attorney, or state representative about the law in your state. Below are some examples of advance directives. LIVING WILL A living will is a set of instructions documenting your wishes about medical care when you cannot care for yourself. It is used if you become:  Terminally ill.  Incapacitated.  Unable to communicate.  Unable to make decisions. Items to consider in your living will include:  The use or non-use of life-sustaining equipment, such as dialysis machines and breathing machines (ventilators).  A do not resuscitate (DNR) order, which is the instruction not to use cardiopulmonary resuscitation (CPR) if breathing or heartbeat stops.  Tube feeding.  Withholding of food and fluids.  Comfort (palliative) care when the goal becomes comfort rather than a cure.  Organ and tissue donation. A living will does not give instructions about distribution of your money and property if you should pass away. It is advisable to seek the expert advice of a lawyer in drawing up a will regarding your possessions. Decisions about taxes, beneficiaries, and asset distribution will be legally binding.  This process can relieve your family and friends of any burdens surrounding disputes or questions that may come up about the allocation of your assets. DO NOT RESUSCITATE (DNR) A do not resuscitate (DNR) order is a request to not have CPR in the event that your heart stops beating or you stop breathing. Unless given other instructions, a health care provider will try to help any patient whose heart has stopped or who has stopped breathing.  HEALTH CARE PROXY AND DURABLE POWER OF ATTORNEY FOR HEALTH CARE A health care proxy is a person (agent) appointed to make medical decisions for you if you cannot. Generally, people choose someone they know well and trust to represent their preferences when they can no longer do so. You should be sure to ask this person for agreement to act as your agent. An agent may have to exercise judgment in the event of a medical decision for which your wishes are not known. The durable power of attorney for health care is the legal document that names your health care proxy. Once written, it should be:  Signed.  Notarized.  Dated.  Copied.  Witnessed.  Incorporated into your medical record. You may also want to appoint someone to manage your financial affairs if you cannot. This is called a durable power of attorney for finances. It is a separate legal document from the durable power of attorney for health care. You may choose the same person or someone different from your health care proxy to act as your agent in financial matters.   This information is not intended to replace advice given to you by your health care provider. Make sure you discuss any   questions you have with your health care provider.   Document Released: 09/11/2007 Document Revised: 06/09/2013 Document Reviewed: 10/22/2012 Elsevier Interactive Patient Education 2016 Elsevier Inc.  

## 2016-04-09 NOTE — Assessment & Plan Note (Signed)
Continue Lotensin HCTZ and Norvasc, BP is well controlled.

## 2016-04-09 NOTE — Assessment & Plan Note (Signed)
Continue ASA, check MRI to see if she has had another stroke. 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 On atorvastatin as well.

## 2016-04-09 NOTE — Progress Notes (Signed)
   Subjective:    Patient ID: Yvonne [name] is a 75 y.o. female presenting with No chief complaint on file.  on 04/09/2016  HPI: Reports she has had a cortisone injection in her knee. She has progressed to using a cane and has stopped needing her walker. She takes an Aleve every morning, which helps to keep her pain away. She is worried she had a small stoke on the left side, because she reports weakness of her leg and her arm. No change in vision. Getting a hearing aid next week. We ordered an MRI of her lumbar spine and she did not get it done. Aggressive PT is helping.  Review of Systems  Constitutional: Negative for chills and fever.  Respiratory: Negative for shortness of breath.   Cardiovascular: Negative for chest pain.  Gastrointestinal: Negative for abdominal pain, nausea and vomiting.  Genitourinary: Negative for dysuria.  Musculoskeletal: Positive for arthralgias and gait problem.  Skin: Negative for rash.      Objective:    BP 139/66   Pulse 80   Temp 98.3 F (36.8 C) (Oral)   Wt 150 lb (68 kg)   SpO2 93%   BMI 28.34 kg/m  Physical Exam  Constitutional: She is oriented to person, place, and time. She appears well-developed and well-nourished. No distress.  HENT:  Head: Normocephalic and atraumatic.  Eyes: No scleral icterus.  Neck: Neck supple.  Cardiovascular: Normal rate.   Murmur heard. Pulmonary/Chest: Effort normal.  Abdominal: Soft.  Neurological: She is alert and oriented to person, place, and time. Gait abnormal.  Skin: Skin is warm and dry.  Psychiatric: She has a normal mood and affect.        Assessment & Plan:   Problem List Items Addressed This Visit      Unprioritized   Essential hypertension - Primary    Continue Lotensin HCTZ and Norvasc, BP is well controlled.      History of cerebrovascular accident    Continue ASA, check MRI to see if she has had another stroke. 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 On atorvastatin as well.      Relevant Orders   MR Brain W Wo Contrast   Numbness in both legs    If no answers on brain MRI--consider Lumbar MRI to look for neurogenic claudication.       Other Visit Diagnoses    Needs flu shot       Relevant Orders   Flu Vaccine QUAD 36+ mos IM   Encounter for immunization       Relevant Orders   Flu Vaccine QUAD 36+ mos IM (Completed)      Total face-to-face time with patient: 25 minutes. Over 50% of encounter was spent on counseling and coordination of care. Return in about 3 months (around 07/10/2016) for a follow-up.  Donnamae Jude 04/09/2016 2:40 PM

## 2016-04-11 ENCOUNTER — Ambulatory Visit: Payer: Commercial Managed Care - HMO | Admitting: Physical Therapy

## 2016-04-11 ENCOUNTER — Encounter: Payer: Self-pay | Admitting: Physical Therapy

## 2016-04-11 DIAGNOSIS — R296 Repeated falls: Secondary | ICD-10-CM | POA: Diagnosis not present

## 2016-04-11 DIAGNOSIS — R293 Abnormal posture: Secondary | ICD-10-CM | POA: Diagnosis not present

## 2016-04-11 DIAGNOSIS — R262 Difficulty in walking, not elsewhere classified: Secondary | ICD-10-CM | POA: Diagnosis not present

## 2016-04-11 DIAGNOSIS — M79605 Pain in left leg: Secondary | ICD-10-CM

## 2016-04-11 DIAGNOSIS — M79604 Pain in right leg: Secondary | ICD-10-CM

## 2016-04-11 DIAGNOSIS — M6281 Muscle weakness (generalized): Secondary | ICD-10-CM

## 2016-04-11 DIAGNOSIS — R2681 Unsteadiness on feet: Secondary | ICD-10-CM | POA: Diagnosis not present

## 2016-04-11 DIAGNOSIS — R2689 Other abnormalities of gait and mobility: Secondary | ICD-10-CM | POA: Diagnosis not present

## 2016-04-11 NOTE — Therapy (Signed)
[facility] [address] [place], [place], [address] Phone: [phone]   Fax:  [phone]  Physical Therapy Treatment  Patient Details  Name: Yvonne [name] MRN: [id number] Date of Birth: 02/03/1941 Referring Provider: Tinnie Gens, MD  Encounter Date: 04/11/2016      PT End of Session - 04/11/16 1434    Visit Number 13   Number of Visits 17   Date for PT Re-Evaluation 04/27/16   Authorization Type Medicare G Code and Progress Note   PT Start Time 1322   PT Stop Time 1405   PT Time Calculation (min) 43 min   Equipment Utilized During Treatment Gait belt   Activity Tolerance Patient tolerated treatment well;Patient limited by pain;Patient limited by fatigue  increased  pain with standing/activity   Behavior During Therapy [facility] for tasks assessed/performed;Impulsive  impulsive at times      Past Medical History:  Diagnosis Date  . Allergy   . Hypertension   . Impaired functional mobility, balance, gait, and endurance 09/02/2015  . Mild cognitive impairment with memory loss 09/02/2015  . Stroke [facility])    pt was 40  . Thyroid disease     Past Surgical History:  Procedure Laterality Date  . ABDOMINAL HYSTERECTOMY    . BRAIN SURGERY    . TOE AMPUTATION  2013   2nd toe on right foot, hammer toe    There were no vitals filed for this visit.      Subjective Assessment - 04/11/16 1324    Subjective Patient reports she is sore and stiff in the mornings when she first wakes up but improves after an hour of moving around. She is scheduled for an MRI on 10/31 to see if she had another stroke.   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 No/denies   Pain Score 0-No pain           [facility] Adult PT  Treatment/Exercise - 04/11/16 1332      Transfers   Transfers Sit to Stand;Stand to Sit   Sit to Stand 6: Modified independent (Device/Increase time)   Stand to Sit 6: Modified independent (Device/Increase time)   Number of Reps Other sets (comment)  10 reps; 3 sets   Comments Using red theraband around legs to encouage gluteal activation during sit to stand. VC required for patient to keep feet on the ground and prevent the use of momentum during exercise.     Ambulation/Gait   Ambulation/Gait Yes   Ambulation/Gait Assistance 4: Min guard   Ambulation/Gait Assistance Details Frequent VC required for posture and step length.   Ambulation Distance (Feet) 250 Feet   Assistive device Straight cane   Gait Pattern Step-through pattern;Decreased stride length;Decreased step length - right;Decreased step length - left;Decreased stance time - left;Lateral trunk lean to left;Trunk flexed;Narrow base of support   Ambulation Surface Level;Indoor   Ramp Other (comment)  min guard assist   Ramp Details (indicate cue type and reason) x2 on ramp indoors; cues on posture   Curb Other (comment)  min guard assist   Curb Details (indicate cue type and reason) x2 on indoor curb with cane; patient independent with cane placement and sequencing.     High Level Balance   High Level Balance Activities Side  stepping;Braiding;Marching forwards   High Level Balance Comments Three laps up and back for each; On both red mats with counter for support. Both UE support for lateral stepping and braiding. Single UE support for marching. VC for proper technique and form.     Lumbar Exercises: Seated   Other Seated Lumbar Exercises Patient seated on stability ball while performing scapular depression and retraction exercises; 3x10;  Theraband used for scapular depression, but scapular retraction performed with out theraband due to level of dificulty for patient. VC and demonstration required for proper technique.             PT Short Term Goals - 03/26/16 1230      PT SHORT TERM GOAL #1   Title Pt will be independent with intial HEP to address balance, flexibility, and weakness. (Target Date: 03/30/16)   Baseline MET 03/26/2016   Time 4   Period Weeks   Status Achieved     PT SHORT TERM GOAL #2   Title Pt will be able to safely reach 4" without balance loss. (Target Date: 03/30/16)   Baseline MET 03/26/2016   Time 4   Period Weeks   Status Achieved     PT SHORT TERM GOAL #3   Title Pt will be able to safely ambulate 150 ft with rollator. (Target Date: 03/30/16)   Baseline MET 03/26/2016 with cane   Time 4   Period Weeks   Status Achieved     PT SHORT TERM GOAL #4   Title Pt verbalizes an understanding of modifying activity level including AD's for pain management. (Target Date: 03/30/16)   Baseline MET 03/26/2016   Time 4   Period Weeks   Status Achieved           PT Long Term Goals - 04/02/16 1339      PT LONG TERM GOAL #1   Title Pt verbalizes and demonstrates understanding of ongoing HEP/fitness plan (Target Date: 04/27/16   Time 8   Period Weeks   Status On-going     PT LONG TERM GOAL #2   Title Pt reports pain increases less than 2 increments on the 0-10 scale with standing and gait activites. (Target Date: 04/27/16)   Time 8   Period Weeks   Status On-going     PT LONG TERM GOAL #3   Title Pt will improve BERG Balance to >/= 52/56 to indicate lower fall risk. (Target Date:04/27/16)   Baseline 04/02/16: 49/56 scored today  Jamey Reas, PT revised to higher score on 04/03/2016   Status Revised     PT LONG TERM GOAL #4   Title Pt will improve TUG score to < 16.5 seconds safely to indicate lower fall risk. (Target Date: 04/27/16)   Time 8   Period Weeks   Status On-going     PT LONG TERM GOAL #5   Title Pt will ambulate 300 ft including negotiating ramps and curbs with LRAD to enable community mobility. (Target Date: 04/27/16)   Time 8   Period Weeks    Status On-going     PT LONG TERM GOAL #6   Title Pt will be able to navigate 125f around furniture without an AD mod I to enable household mobility. (Target Date: 04/27/16)   Time 8   Period Weeks   Status On-going          Plan - 04/11/16 1435    Clinical Impression Statement Today's skilled session continued to address gait, balance, and strengthening working  towards LTGs. Pt demonstrated decreased postural alignment, but can correct when cued.  She fatigues quickly, requiring short rest brakes, and stated her feet were burning after performing gait and balance activities. Patient is making progress towards goals and should benefit from cointinued PT to progress towards unmet goals.   Rehab Potential Good   Clinical Impairments Affecting Rehab Potential Hard of hearing, cognitive deficits, R eye blindness   PT Frequency 2x / week   PT Duration 8 weeks   PT Treatment/Interventions ADLs/Self Care Home Management;Gait training;Stair training;Functional mobility training;Neuromuscular re-education;Balance training;Therapeutic exercise;Therapeutic activities;Manual techniques;Patient/family education;Orthotic Fit/Training;Visual/perceptual remediation/compensation;Energy conservation   PT Next Visit Plan  continue towards LTGs with gait, balance, and postural stability activities.   PT Home Exercise Plan OTAGO   Consulted and Agree with Plan of Care Patient      Patient will benefit from skilled therapeutic intervention in order to improve the following deficits and impairments:  Abnormal gait, Decreased activity tolerance, Decreased balance, Decreased cognition, Decreased mobility, Decreased endurance, Decreased range of motion, Decreased knowledge of use of DME, Decreased strength, Impaired sensation, Impaired flexibility, Postural dysfunction, Impaired vision/preception, Pain  Visit Diagnosis: Other abnormalities of gait and mobility  Muscle weakness (generalized)  Unsteadiness on  feet  Abnormal posture  Repeated falls  Pain in right leg  Pain in left leg     Problem List Patient Active Problem List   Diagnosis Date Noted  . Mild cognitive impairment with memory loss 09/02/2015  . Impaired functional mobility, balance, gait, and endurance 09/02/2015  . Hearing impairment 09/02/2015  . 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 Skin nodule 09/13/2014  . Osteopenia 08/30/2014  . Diffuse pain 04/07/2014  . Numbness in both legs 10/16/2012  . Constipation 06/04/2012  . Hypercholesteremia 09/05/2011  . Osteoarthritis 04/12/2011  . 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 Hypothyroidism 07/12/2009  . ANXIETY 07/12/2009  . INSOMNIA, PERSISTENT 07/12/2009  . Essential hypertension 07/12/2009  . History of cerebrovascular accident 07/12/2009    Benjiman Core, Dayle Points 04/11/2016, 2:47 PM  [facility] [address] [place], [place], [address] Phone: [phone]   Fax:  [phone]  Name: Yvonne [name] MRN: [id number] Date of Birth: 08/12/1940

## 2016-04-13 ENCOUNTER — Ambulatory Visit (HOSPITAL_COMMUNITY): Payer: Commercial Managed Care - HMO

## 2016-04-13 DIAGNOSIS — H905 Unspecified sensorineural hearing loss: Secondary | ICD-10-CM | POA: Diagnosis not present

## 2016-04-16 ENCOUNTER — Ambulatory Visit: Payer: Commercial Managed Care - HMO | Admitting: Physical Therapy

## 2016-04-16 ENCOUNTER — Encounter: Payer: Self-pay | Admitting: Physical Therapy

## 2016-04-16 DIAGNOSIS — R2689 Other abnormalities of gait and mobility: Secondary | ICD-10-CM

## 2016-04-16 DIAGNOSIS — R2681 Unsteadiness on feet: Secondary | ICD-10-CM | POA: Diagnosis not present

## 2016-04-16 DIAGNOSIS — R293 Abnormal posture: Secondary | ICD-10-CM | POA: Diagnosis not present

## 2016-04-16 DIAGNOSIS — M79604 Pain in right leg: Secondary | ICD-10-CM

## 2016-04-16 DIAGNOSIS — M79605 Pain in left leg: Secondary | ICD-10-CM | POA: Diagnosis not present

## 2016-04-16 DIAGNOSIS — R296 Repeated falls: Secondary | ICD-10-CM

## 2016-04-16 DIAGNOSIS — M6281 Muscle weakness (generalized): Secondary | ICD-10-CM | POA: Diagnosis not present

## 2016-04-16 DIAGNOSIS — R262 Difficulty in walking, not elsewhere classified: Secondary | ICD-10-CM | POA: Diagnosis not present

## 2016-04-16 NOTE — Therapy (Signed)
[facility] [address] [place], [place], [address] Phone: [phone]   Fax:  [phone]  Physical Therapy Treatment  Patient Details  Name: Yvonne [name] MRN: [id number] Date of Birth: 20-Sep-1940 Referring Provider: Darron Doom, MD  Encounter Date: 04/16/2016      PT End of Session - 04/16/16 1354    Visit Number 14   Number of Visits 17   Date for PT Re-Evaluation 04/27/16   Authorization Type Medicare G Code and Progress Note   PT Start Time 6948   PT Stop Time 1310   PT Time Calculation (min) 50 min   Equipment Utilized During Treatment Gait belt   Activity Tolerance Patient tolerated treatment well;Patient limited by pain;Patient limited by fatigue  increased  pain with standing/activity   Behavior During Therapy [facility] for tasks assessed/performed;Impulsive  impulsive at times      Past Medical History:  Diagnosis Date  . Allergy   . Hypertension   . Impaired functional mobility, balance, gait, and endurance 09/02/2015  . Mild cognitive impairment with memory loss 09/02/2015  . Stroke [facility])    pt was 40  . Thyroid disease     Past Surgical History:  Procedure Laterality Date  . ABDOMINAL HYSTERECTOMY    . BRAIN SURGERY    . TOE AMPUTATION  2013   2nd toe on right foot, hammer toe    There were no vitals filed for this visit.      Subjective Assessment - 04/16/16 1223    Subjective Patient reports she is going to start chair yoga tomorrow. 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 Yes   Pain Score 1    Pain Location Knee   Pain Orientation Left           [facility] Adult PT Treatment/Exercise - 04/16/16 1320      Transfers    Transfers Sit to Stand;Stand to Sit   Sit to Stand 6: Modified independent (Device/Increase time)   Stand to Sit 6: Modified independent (Device/Increase time)     Ambulation/Gait   Ambulation/Gait Yes   Ambulation/Gait Assistance 4: Min guard   Ambulation/Gait Assistance Details Verbal and tactile cues to facilitate upright posture, weight shifting to the L and increased step length on the R.   Ambulation Distance (Feet) 330 Feet 2 reps   Assistive device Straight cane   Gait Pattern Decreased stride length;Decreased step length - right;Decreased step length - left;Decreased stance time - left;Lateral trunk lean to left;Trunk flexed;Narrow base of support;Step-to pattern   Ambulation Surface Level;Indoor     Exercises   Other Exercises  Performed while seated on 24" stool with feet flat on the ground; no UE support. Rotation to look over L & R shoulder x10. Posterior leans onto therapist x10; to engage core muscles. Anterior leans with Bil UE on legs x10; to engage erector spinae. Min guard assist.             Balance Exercises - 04/16/16 1220      Balance Exercises: Standing   Rockerboard Anterior/posterior;Lateral;Head turns;EO;30 seconds;UE support;Intermittent UE support   Balance Beam  Min assist in static standing on black balance beam for 30 seconds x2; with intermitant  UE support. Progressed to anterior/posterior stepping with static LE remaining on beam; 20x each way; Bil UE support during stepping; No UE support when static. Seated breaks between activities. Verbal ques for upright posture and to use mirror for visual feedback.   Other Standing Exercises Min to mod assist on rockerboard: static standing for 30 seconds with intermitant UE and reaching x30 seconds; Head turns and head knods x 30 seconds; Progressing to anterior/posterior and lateral weight shifts; x30 seconds. VC and visual feedback for foot palcement & postural alignment.     Balance Exercises: Seated    Static Sitting --             PT Short Term Goals - 03/26/16 1230      PT SHORT TERM GOAL #1   Title Pt will be independent with intial HEP to address balance, flexibility, and weakness. (Target Date: 03/30/16)   Baseline MET 03/26/2016   Time 4   Period Weeks   Status Achieved     PT SHORT TERM GOAL #2   Title Pt will be able to safely reach 4" without balance loss. (Target Date: 03/30/16)   Baseline MET 03/26/2016   Time 4   Period Weeks   Status Achieved     PT SHORT TERM GOAL #3   Title Pt will be able to safely ambulate 150 ft with rollator. (Target Date: 03/30/16)   Baseline MET 03/26/2016 with cane   Time 4   Period Weeks   Status Achieved     PT SHORT TERM GOAL #4   Title Pt verbalizes an understanding of modifying activity level including AD's for pain management. (Target Date: 03/30/16)   Baseline MET 03/26/2016   Time 4   Period Weeks   Status Achieved           PT Long Term Goals - 04/02/16 1339      PT LONG TERM GOAL #1   Title Pt verbalizes and demonstrates understanding of ongoing HEP/fitness plan (Target Date: 04/27/16   Time 8   Period Weeks   Status On-going     PT LONG TERM GOAL #2   Title Pt reports pain increases less than 2 increments on the 0-10 scale with standing and gait activites. (Target Date: 04/27/16)   Time 8   Period Weeks   Status On-going     PT LONG TERM GOAL #3   Title Pt will improve BERG Balance to >/= 52/56 to indicate lower fall risk. (Target Date:04/27/16)   Baseline 04/02/16: 49/56 scored today  Jamey Reas, PT revised to higher score on 04/03/2016   Status Revised     PT LONG TERM GOAL #4   Title Pt will improve TUG score to < 16.5 seconds safely to indicate lower fall risk. (Target Date: 04/27/16)   Time 8   Period Weeks   Status On-going     PT LONG TERM GOAL #5   Title Pt will ambulate 300 ft including negotiating ramps and curbs with LRAD to enable community mobility. (Target Date: 04/27/16)   Time 8    Period Weeks   Status On-going     PT LONG TERM GOAL #6   Title Pt will be able to navigate 176f around furniture without an AD mod I to enable household mobility. (Target Date: 04/27/16)   Time 8   Period Weeks   Status On-going               Plan - 04/16/16 1354  Clinical Impression Statement Pt arrived to therapy with out cane today stating she forgot. Had pt use clinic's single tip cane during treatment for safty. Todays skilled session addressed balance, gait deviations, and posture, working towards LTGs. She requires frequent VC for to correct postural alignment and responds well to visual feedback when cued to look.    Rehab Potential Good   Clinical Impairments Affecting Rehab Potential Hard of hearing, cognitive deficits, R eye blindness   PT Frequency 2x / week   PT Duration 8 weeks   PT Treatment/Interventions ADLs/Self Care Home Management;Gait training;Stair training;Functional mobility training;Neuromuscular re-education;Balance training;Therapeutic exercise;Therapeutic activities;Manual techniques;Patient/family education;Orthotic Fit/Training;Visual/perceptual remediation/compensation;Energy conservation   PT Next Visit Plan Focus on weight shifting to LLE during gait; continue towards LTGs with gait, balance, and postural stability activities.   PT Home Exercise Plan OTAGO   Consulted and Agree with Plan of Care Patient      Patient will benefit from skilled therapeutic intervention in order to improve the following deficits and impairments:  Abnormal gait, Decreased activity tolerance, Decreased balance, Decreased cognition, Decreased mobility, Decreased endurance, Decreased range of motion, Decreased knowledge of use of DME, Decreased strength, Impaired sensation, Impaired flexibility, Postural dysfunction, Impaired vision/preception, Pain  Visit Diagnosis: Muscle weakness (generalized)  Unsteadiness on feet  Abnormal posture  Repeated falls  Other  abnormalities of gait and mobility  Pain in right leg  Pain in left leg     Problem List Patient Active Problem List   Diagnosis Date Noted  . Mild cognitive impairment with memory loss 09/02/2015  . Impaired functional mobility, balance, gait, and endurance 09/02/2015  . Hearing impairment 09/02/2015  . 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 Skin nodule 09/13/2014  . Osteopenia 08/30/2014  . Diffuse pain 04/07/2014  . Numbness in both legs 10/16/2012  . Constipation 06/04/2012  . Hypercholesteremia 09/05/2011  . Osteoarthritis 04/12/2011  . 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 Hypothyroidism 07/12/2009  . ANXIETY 07/12/2009  . INSOMNIA, PERSISTENT 07/12/2009  . Essential hypertension 07/12/2009  . History of cerebrovascular accident 07/12/2009    Benjiman Core, Dayle Points 04/16/2016, 3:36 PM  [facility] [address] [place], [place], [address] Phone: [phone]   Fax:  [phone]  Name: Yvonne [name] MRN: [id number] Date of Birth: Jun 29, 1940

## 2016-04-18 ENCOUNTER — Encounter: Payer: Self-pay | Admitting: Physical Therapy

## 2016-04-18 ENCOUNTER — Ambulatory Visit: Payer: Commercial Managed Care - HMO | Attending: Family Medicine | Admitting: Physical Therapy

## 2016-04-18 ENCOUNTER — Telehealth: Payer: Self-pay | Admitting: *Deleted

## 2016-04-18 ENCOUNTER — Ambulatory Visit (HOSPITAL_COMMUNITY)
Admission: RE | Admit: 2016-04-18 | Discharge: 2016-04-18 | Disposition: A | Discharge status: Home / Self Care | Payer: Commercial Managed Care - HMO | Source: Ambulatory Visit | Attending: Family Medicine | Admitting: Family Medicine

## 2016-04-18 ENCOUNTER — Other Ambulatory Visit (HOSPITAL_COMMUNITY): Payer: Self-pay | Admitting: Diagnostic Radiology

## 2016-04-18 ENCOUNTER — Encounter (HOSPITAL_COMMUNITY): Payer: Self-pay

## 2016-04-18 ENCOUNTER — Ambulatory Visit (HOSPITAL_COMMUNITY)
Admission: RE | Admit: 2016-04-18 | Discharge: 2016-04-18 | Disposition: A | Discharge status: Home / Self Care | Payer: Commercial Managed Care - HMO | Source: Ambulatory Visit | Attending: Diagnostic Radiology | Admitting: Diagnostic Radiology

## 2016-04-18 ENCOUNTER — Ambulatory Visit: Payer: Commercial Managed Care - HMO | Admitting: Physical Therapy

## 2016-04-18 DIAGNOSIS — R2689 Other abnormalities of gait and mobility: Secondary | ICD-10-CM

## 2016-04-18 DIAGNOSIS — R9082 White matter disease, unspecified: Secondary | ICD-10-CM | POA: Diagnosis not present

## 2016-04-18 DIAGNOSIS — R2681 Unsteadiness on feet: Secondary | ICD-10-CM

## 2016-04-18 DIAGNOSIS — Z8673 Personal history of transient ischemic attack (TIA), and cerebral infarction without residual deficits: Secondary | ICD-10-CM | POA: Insufficient documentation

## 2016-04-18 DIAGNOSIS — R296 Repeated falls: Secondary | ICD-10-CM | POA: Diagnosis not present

## 2016-04-18 DIAGNOSIS — S0540XA Penetrating wound of orbit with or without foreign body, unspecified eye, initial encounter: Secondary | ICD-10-CM

## 2016-04-18 DIAGNOSIS — Z9889 Other specified postprocedural states: Secondary | ICD-10-CM | POA: Diagnosis not present

## 2016-04-18 DIAGNOSIS — G9389 Other specified disorders of brain: Secondary | ICD-10-CM | POA: Diagnosis not present

## 2016-04-18 DIAGNOSIS — J329 Chronic sinusitis, unspecified: Secondary | ICD-10-CM | POA: Diagnosis not present

## 2016-04-18 DIAGNOSIS — M79604 Pain in right leg: Secondary | ICD-10-CM | POA: Diagnosis not present

## 2016-04-18 DIAGNOSIS — M79605 Pain in left leg: Secondary | ICD-10-CM | POA: Diagnosis not present

## 2016-04-18 DIAGNOSIS — Z01818 Encounter for other preprocedural examination: Secondary | ICD-10-CM | POA: Diagnosis not present

## 2016-04-18 DIAGNOSIS — R930 Abnormal findings on diagnostic imaging of skull and head, not elsewhere classified: Secondary | ICD-10-CM | POA: Diagnosis not present

## 2016-04-18 DIAGNOSIS — D496 Neoplasm of unspecified behavior of brain: Secondary | ICD-10-CM | POA: Insufficient documentation

## 2016-04-18 DIAGNOSIS — R293 Abnormal posture: Secondary | ICD-10-CM | POA: Diagnosis not present

## 2016-04-18 DIAGNOSIS — Z1810 Retained metal fragments, unspecified: Secondary | ICD-10-CM | POA: Insufficient documentation

## 2016-04-18 DIAGNOSIS — M6281 Muscle weakness (generalized): Secondary | ICD-10-CM | POA: Diagnosis not present

## 2016-04-18 LAB — CREATININE, SERUM
Creatinine, Ser: 0.77 mg/dL (ref 0.44–1.00)
GFR calc Af Amer: >60 mL/min (ref >60)

## 2016-04-18 NOTE — Telephone Encounter (Signed)
Received call from [place] at [facility] radiology and patient is their for her MRI appointment and mentioned to them that she had a previous brain surgery.  Patient was unsure per crystal of what exactly had been done during this time and they would like an xray brain to double check for metal in this area.  Order placed and sent to prescriber to cosign. Clinten Howk,CMA

## 2016-04-18 NOTE — Progress Notes (Signed)
Per Dr. Nevada Crane patient cannot have MRI due to metal/coiling in her brain and vessels.  Pt states she had brain surgery 10 years ago for a tumor and also a stroke when she was around 75 years old when the metal could have been placed but she is unsure of specific dates and what was placed in her brain.

## 2016-04-18 NOTE — Therapy (Signed)
[facility] [address] [place], [place], [address] Phone: [phone]   Fax:  [phone]  Physical Therapy Treatment  Patient Details  Name: Yvonne [name] MRN: [id number] Date of Birth: 1940-08-30 Referring Provider: Darron Doom, MD  Encounter Date: 04/18/2016      PT End of Session - 04/18/16 2118    Visit Number 15   Number of Visits 17   Date for PT Re-Evaluation 04/27/16   Authorization Type Medicare G Code and Progress Note   PT Start Time 1233   PT Stop Time 1318   PT Time Calculation (min) 45 min   Equipment Utilized During Treatment Gait belt   Activity Tolerance Patient tolerated treatment well;Patient limited by pain;Patient limited by fatigue  increased  pain with standing/activity   Behavior During Therapy [facility] for tasks assessed/performed;Impulsive  impulsive at times      Past Medical History:  Diagnosis Date  . Allergy   . Hypertension   . Impaired functional mobility, balance, gait, and endurance 09/02/2015  . Mild cognitive impairment with memory loss 09/02/2015  . Stroke [facility])    pt was 40  . Thyroid disease     Past Surgical History:  Procedure Laterality Date  . ABDOMINAL HYSTERECTOMY    . BRAIN SURGERY    . TOE AMPUTATION  2013   2nd toe on right foot, hammer toe    There were no vitals filed for this visit.      Subjective Assessment - 04/18/16 1234    Subjective She bought quad tip for her cane and she thinks it helps.    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 Yes   Pain Score 8    Pain Location Foot   Pain Orientation Right;Left   Pain Descriptors / Indicators Burning   Pain Type Chronic pain   Pain Onset More than a month ago   Pain Frequency  Intermittent   Aggravating Factors  walking or standing a lot   Pain Relieving Factors sitting                         [facility] Adult PT Treatment/Exercise - 04/18/16 1230      Ambulation/Gait   Ambulation/Gait Yes   Ambulation/Gait Assistance 4: Min assist   Ambulation/Gait Assistance Details demo & verbal cues on weight shift over LLE in stance prior to stepping with RLE.    Ambulation Distance (Feet) 200 Feet  200' X 2   Assistive device Straight cane  quad tip   Gait Pattern Decreased stride length;Decreased step length - right;Decreased step length - left;Decreased stance time - left;Lateral trunk lean to left;Trunk flexed;Narrow base of support;Step-to pattern   Ambulation Surface Indoor;Level             Balance Exercises - 04/18/16 1230      Balance Exercises: Standing   Step Ups Forward;2 inch;Intermittent UE support  alternate stepping on/off stabalizing: ant & post   Balance Beam standing crossways on 2" dense foam beam with minA /tactile cues to maintain center of gravity over her base and mirror for visual feedback: head turns right/left & up/down.    Other Standing Exercises In parallel bars with BUE support with tactile & verbal cues: rolling  tennis ball ant/post, laterally and circles 10 reps each on RLE & LLE. Forward flexion / back extension to upright with BUE support with cues to minimize UE assist.            PT Education - 04/18/16 1310    Education provided Yes   Education Details use of assistive device like RW to increase safety with longer distances due to fatigue   Person(s) Educated Patient   Methods Explanation;Verbal cues   Comprehension Verbalized understanding;Verbal cues required;Need further instruction          PT Short Term Goals - 03/26/16 1230      PT SHORT TERM GOAL #1   Title Pt will be independent with intial HEP to address balance, flexibility, and weakness. (Target Date: 03/30/16)   Baseline MET 03/26/2016    Time 4   Period Weeks   Status Achieved     PT SHORT TERM GOAL #2   Title Pt will be able to safely reach 4" without balance loss. (Target Date: 03/30/16)   Baseline MET 03/26/2016   Time 4   Period Weeks   Status Achieved     PT SHORT TERM GOAL #3   Title Pt will be able to safely ambulate 150 ft with rollator. (Target Date: 03/30/16)   Baseline MET 03/26/2016 with cane   Time 4   Period Weeks   Status Achieved     PT SHORT TERM GOAL #4   Title Pt verbalizes an understanding of modifying activity level including AD's for pain management. (Target Date: 03/30/16)   Baseline MET 03/26/2016   Time 4   Period Weeks   Status Achieved           PT Long Term Goals - 04/02/16 1339      PT LONG TERM GOAL #1   Title Pt verbalizes and demonstrates understanding of ongoing HEP/fitness plan (Target Date: 04/27/16   Time 8   Period Weeks   Status On-going     PT LONG TERM GOAL #2   Title Pt reports pain increases less than 2 increments on the 0-10 scale with standing and gait activites. (Target Date: 04/27/16)   Time 8   Period Weeks   Status On-going     PT LONG TERM GOAL #3   Title Pt will improve BERG Balance to >/= 52/56 to indicate lower fall risk. (Target Date:04/27/16)   Baseline 04/02/16: 49/56 scored today  Jamey Reas, PT revised to higher score on 04/03/2016   Status Revised     PT LONG TERM GOAL #4   Title Pt will improve TUG score to < 16.5 seconds safely to indicate lower fall risk. (Target Date: 04/27/16)   Time 8   Period Weeks   Status On-going     PT LONG TERM GOAL #5   Title Pt will ambulate 300 ft including negotiating ramps and curbs with LRAD to enable community mobility. (Target Date: 04/27/16)   Time 8   Period Weeks   Status On-going     PT LONG TERM GOAL #6   Title Pt will be able to navigate 132f around furniture without an AD mod I to enable household mobility. (Target Date: 04/27/16)   Time 8   Period Weeks   Status On-going                Plan - 04/18/16 2120    Clinical Impression Statement Patient improved ability to shift weight over LLE in stance which improved RLE step  length. However her LLE fatigued at 200' requiring seated rest 10 mintues to recover.    Rehab Potential Good   Clinical Impairments Affecting Rehab Potential Hard of hearing, cognitive deficits, R eye blindness   PT Frequency 2x / week   PT Duration 8 weeks   PT Treatment/Interventions ADLs/Self Care Home Management;Gait training;Stair training;Functional mobility training;Neuromuscular re-education;Balance training;Therapeutic exercise;Therapeutic activities;Manual techniques;Patient/family education;Orthotic Fit/Training;Visual/perceptual remediation/compensation;Energy conservation   PT Next Visit Plan begin assessing LTGs   PT Home Exercise Plan OTAGO   Consulted and Agree with Plan of Care Patient      Patient will benefit from skilled therapeutic intervention in order to improve the following deficits and impairments:  Abnormal gait, Decreased activity tolerance, Decreased balance, Decreased cognition, Decreased mobility, Decreased endurance, Decreased range of motion, Decreased knowledge of use of DME, Decreased strength, Impaired sensation, Impaired flexibility, Postural dysfunction, Impaired vision/preception, Pain  Visit Diagnosis: Muscle weakness (generalized)  Unsteadiness on feet  Abnormal posture  Other abnormalities of gait and mobility     Problem List Patient Active Problem List   Diagnosis Date Noted  . Mild cognitive impairment with memory loss 09/02/2015  . Impaired functional mobility, balance, gait, and endurance 09/02/2015  . Hearing impairment 09/02/2015  . 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 Skin nodule 09/13/2014  . Osteopenia 08/30/2014  . Diffuse pain 04/07/2014  . Numbness in both legs 10/16/2012  . Constipation 06/04/2012  . Hypercholesteremia 09/05/2011  . Osteoarthritis 04/12/2011  . Aortic  valve stenosis and insufficiency, rheumatic 12/12/2009  . Hypothyroidism 07/12/2009  . ANXIETY 07/12/2009  . INSOMNIA, PERSISTENT 07/12/2009  . Essential hypertension 07/12/2009  . History of cerebrovascular accident 07/12/2009    Jamey Reas PT, DPT 04/18/2016, 9:22 PM  [facility] [address] [place], [place], [address] Phone: [phone]   Fax:  [phone]  Name: FAIZAH KANDLER MRN: [id number] Date of Birth: [birth year]

## 2016-04-23 ENCOUNTER — Ambulatory Visit: Payer: Commercial Managed Care - HMO | Admitting: Physical Therapy

## 2016-04-23 DIAGNOSIS — M79604 Pain in right leg: Secondary | ICD-10-CM | POA: Diagnosis not present

## 2016-04-23 DIAGNOSIS — R2681 Unsteadiness on feet: Secondary | ICD-10-CM

## 2016-04-23 DIAGNOSIS — M79605 Pain in left leg: Secondary | ICD-10-CM | POA: Diagnosis not present

## 2016-04-23 DIAGNOSIS — R293 Abnormal posture: Secondary | ICD-10-CM | POA: Diagnosis not present

## 2016-04-23 DIAGNOSIS — M6281 Muscle weakness (generalized): Secondary | ICD-10-CM

## 2016-04-23 DIAGNOSIS — R296 Repeated falls: Secondary | ICD-10-CM

## 2016-04-23 DIAGNOSIS — R2689 Other abnormalities of gait and mobility: Secondary | ICD-10-CM

## 2016-04-23 NOTE — Therapy (Signed)
[facility] [address] [place], [place], [address] Phone: [phone]   Fax:  [phone]  Physical Therapy Treatment  Patient Details  Name: Yvonne [name] MRN: [id number] Date of Birth: 1940/09/09 Referring Provider: Darron Doom, MD  Encounter Date: 04/23/2016      PT End of Session - 04/23/16 1457    Visit Number 16   Number of Visits 17   Date for PT Re-Evaluation 04/27/16   Authorization Type Medicare G Code and Progress Note   PT Start Time 1230   PT Stop Time 1315   PT Time Calculation (min) 45 min   Equipment Utilized During Treatment Gait belt   Activity Tolerance Patient tolerated treatment well;Patient limited by pain;Patient limited by fatigue  increased  pain with standing/activity   Behavior During Therapy [facility] for tasks assessed/performed;Impulsive  impulsive at times      Past Medical History:  Diagnosis Date  . Allergy   . Hypertension   . Impaired functional mobility, balance, gait, and endurance 09/02/2015  . Mild cognitive impairment with memory loss 09/02/2015  . Stroke [facility])    pt was 40  . Thyroid disease     Past Surgical History:  Procedure Laterality Date  . ABDOMINAL HYSTERECTOMY    . BRAIN SURGERY    . TOE AMPUTATION  2013   2nd toe on right foot, hammer toe    There were no vitals filed for this visit.      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 She is still waiting on results. She reported her pain as a  0 but later stated both knees hurt when walking.    Pertinent History CVA, brain tumor, HTN, 2nd toe amp on Rt foot, OA, osteopenia, mild cognitive impairment, hearing impairment, blind Rt eye   Limitations Walking;Standing   How long can you sit comfortably?     Patient Stated Goals "I want to get well and walk normally."   Currently in Pain? No/denies   Pain Score 0-No pain   Pain Onset More than a month ago            [facility] PT Assessment - 04/23/16 1241      Berg Balance Test   Sit to Stand Able to stand without using hands and stabilize independently   Standing Unsupported Able to stand safely 2 minutes   Sitting with Back Unsupported but Feet Supported on Floor or Stool Able to sit safely and securely 2 minutes   Stand to Sit Sits safely with minimal use of hands   Transfers Able to transfer safely, minor use of hands   Standing Unsupported with Eyes Closed Able to stand 10 seconds safely   Standing Ubsupported with Feet Together Able to place feet together independently and stand for 1 minute with supervision   From Standing, Reach Forward with Outstretched Arm Can reach forward >12 cm safely (5")  7 inches   From Standing Position, Pick up Object from Floor Able to pick up shoe, needs supervision   From Standing Position, Turn to Look Behind Over each Shoulder Looks behind one side only/other side shows less weight shift  Left > Right   Turn 360 Degrees Able to turn 360 degrees safely one side only in 4 seconds or less   Standing Unsupported,  Alternately Place Feet on Step/Stool Able to complete 4 steps without aid or supervision   Standing Unsupported, One Foot in Front Able to plae foot ahead of the other independently and hold 30 seconds  retested due to pt fatigue   Standing on One Leg Able to lift leg independently and hold 5-10 seconds   Total Score 47   Berg comment: 47= 46-51 moderate risk of falling (>50%) category; Pt required rest break due to LE fatigue and bil knee pain           PT Short Term Goals - 03/26/16 1230      PT SHORT TERM GOAL #1   Title Pt will be independent with intial HEP to address balance, flexibility, and weakness. (Target Date: 03/30/16)   Baseline MET 03/26/2016   Time 4   Period Weeks   Status Achieved     PT SHORT TERM GOAL #2   Title Pt will be  able to safely reach 4" without balance loss. (Target Date: 03/30/16)   Baseline MET 03/26/2016   Time 4   Period Weeks   Status Achieved     PT SHORT TERM GOAL #3   Title Pt will be able to safely ambulate 150 ft with rollator. (Target Date: 03/30/16)   Baseline MET 03/26/2016 with cane   Time 4   Period Weeks   Status Achieved     PT SHORT TERM GOAL #4   Title Pt verbalizes an understanding of modifying activity level including AD's for pain management. (Target Date: 03/30/16)   Baseline MET 03/26/2016   Time 4   Period Weeks   Status Achieved           PT Long Term Goals - 04/23/16 1501      PT LONG TERM GOAL #1   Title Pt verbalizes and demonstrates understanding of ongoing HEP/fitness plan (Target Date: 04/27/16   Baseline 04/23/16 Pt understands HEP but does not perform with the recomended frequency   Time 8   Period Weeks   Status Achieved     PT LONG TERM GOAL #2   Title Pt reports pain increases less than 2 increments on the 0-10 scale with standing and gait activites. (Target Date: 04/27/16)   Time 8   Period Weeks   Status On-going     PT LONG TERM GOAL #3   Title Pt will improve BERG Balance to >/= 52/56 to indicate lower fall risk. (Target Date:04/27/16)   Baseline  04/23/16 47/56 scored today   Status Not Met     PT LONG TERM GOAL #4   Title Pt will improve TUG score to < 16.5 seconds safely to indicate lower fall risk. (Target Date: 04/27/16)   Time 8   Period Weeks   Status On-going     PT LONG TERM GOAL #5   Title Pt will ambulate 300 ft including negotiating ramps and curbs with LRAD to enable community mobility. (Target Date: 04/27/16)   Time 8   Period Weeks   Status On-going     PT LONG TERM GOAL #6   Title Pt will be able to navigate 154f around furniture without an AD mod I to enable household mobility. (Target Date: 04/27/16)   Time 8   Period Weeks   Status On-going           Plan - 04/23/16 1458    Clinical Impression  Statement Todays skilled sesson focused on checking LTGs. Though pt Berg score did fall by  two [name] it still puts her at a moderate fall risk. She required a rest while performing the Berg due to knee pain and LE fatigue; possibly contributing to her decreased score. She states she is performing her HEP but only 2-3 days per week.    Rehab Potential Good   Clinical Impairments Affecting Rehab Potential Hard of hearing, cognitive deficits, R eye blindness   PT Frequency 2x / week   PT Duration 8 weeks   PT Treatment/Interventions ADLs/Self Care Home Management;Gait training;Stair training;Functional mobility training;Neuromuscular re-education;Balance training;Therapeutic exercise;Therapeutic activities;Manual techniques;Patient/family education;Orthotic Fit/Training;Visual/perceptual remediation/compensation;Energy conservation   PT Next Visit Plan Continue assessing LTGs   PT Home Exercise Plan OTAGO   Consulted and Agree with Plan of Care Patient      Patient will benefit from skilled therapeutic intervention in order to improve the following deficits and impairments:  Abnormal gait, Decreased activity tolerance, Decreased balance, Decreased cognition, Decreased mobility, Decreased endurance, Decreased range of motion, Decreased knowledge of use of DME, Decreased strength, Impaired sensation, Impaired flexibility, Postural dysfunction, Impaired vision/preception, Pain  Visit Diagnosis: Muscle weakness (generalized)  Unsteadiness on feet  Abnormal posture  Other abnormalities of gait and mobility  Repeated falls  Pain in right leg  Pain in left leg     Problem List Patient Active Problem List   Diagnosis Date Noted  . Mild cognitive impairment with memory loss 09/02/2015  . Impaired functional mobility, balance, gait, and endurance 09/02/2015  . Hearing impairment 09/02/2015  . 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 Skin nodule 09/13/2014  . Osteopenia 08/30/2014  . Diffuse pain  04/07/2014  . Numbness in both legs 10/16/2012  . Constipation 06/04/2012  . Hypercholesteremia 09/05/2011  . Osteoarthritis 04/12/2011  . 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 Hypothyroidism 07/12/2009  . ANXIETY 07/12/2009  . INSOMNIA, PERSISTENT 07/12/2009  . Essential hypertension 07/12/2009  . History of cerebrovascular accident 07/12/2009    Yvonne [name], Yvonne [name] 04/23/2016, 3:08 PM  [facility] [address] [place], [place], [address] Phone: [phone]   Fax:  [phone]  Name: Yvonne [name] MRN: [id number] Date of Birth: 07-Nov-1940

## 2016-04-25 ENCOUNTER — Encounter: Payer: Self-pay | Admitting: Physical Therapy

## 2016-04-25 ENCOUNTER — Ambulatory Visit: Payer: Commercial Managed Care - HMO | Admitting: Physical Therapy

## 2016-04-25 DIAGNOSIS — R2689 Other abnormalities of gait and mobility: Secondary | ICD-10-CM | POA: Diagnosis not present

## 2016-04-25 DIAGNOSIS — M79605 Pain in left leg: Secondary | ICD-10-CM | POA: Diagnosis not present

## 2016-04-25 DIAGNOSIS — R293 Abnormal posture: Secondary | ICD-10-CM

## 2016-04-25 DIAGNOSIS — M6281 Muscle weakness (generalized): Secondary | ICD-10-CM | POA: Diagnosis not present

## 2016-04-25 DIAGNOSIS — R2681 Unsteadiness on feet: Secondary | ICD-10-CM

## 2016-04-25 DIAGNOSIS — R296 Repeated falls: Secondary | ICD-10-CM | POA: Diagnosis not present

## 2016-04-25 DIAGNOSIS — M79604 Pain in right leg: Secondary | ICD-10-CM | POA: Diagnosis not present

## 2016-04-25 NOTE — Therapy (Signed)
[facility] [address] [place], [place], [address] Phone: [phone]   Fax:  [phone]  Physical Therapy Treatment  Patient Details  Name: Yvonne [name] MRN: [id number] Date of Birth: 10/29/1940 Referring Provider: Darron Doom, MD  Encounter Date: 04/25/2016   PHYSICAL THERAPY DISCHARGE SUMMARY  Visits from Start of Care: 17  Current functional level related to goals / functional outcomes: See below   Remaining deficits: See below   Education / Equipment: Fall prevention strategies, HEP / ongoing fitness plan Plan: Patient agrees to discharge.  Patient goals were partially met. Patient is being discharged due to meeting the stated rehab goals.  ?????           PT End of Session - 04/25/16 2045    Visit Number 17   Number of Visits 17   Date for PT Re-Evaluation 04/27/16   Authorization Type Medicare G Code and Progress Note   PT Start Time 1230   PT Stop Time 1313   PT Time Calculation (min) 43 min   Equipment Utilized During Treatment Gait belt   Activity Tolerance Patient tolerated treatment well  increased  pain with standing/activity   Behavior During Therapy WFL for tasks assessed/performed;Impulsive  impulsive at times      Past Medical History:  Diagnosis Date  . Allergy   . Hypertension   . Impaired functional mobility, balance, gait, and endurance 09/02/2015  . Mild cognitive impairment with memory loss 09/02/2015  . Stroke [facility])    pt was 40  . Thyroid disease     Past Surgical History:  Procedure Laterality Date  . ABDOMINAL HYSTERECTOMY    . BRAIN SURGERY    . TOE AMPUTATION  2013   2nd toe on right foot, hammer toe    There were no vitals filed for this visit.      Subjective Assessment - 04/25/16 1231    Subjective No falls. She has been doing her exercises.    Pertinent History CVA, brain tumor, HTN, 2nd toe amp on Rt foot, OA, osteopenia, mild cognitive  impairment, hearing impairment, blind Rt eye   Limitations Walking;Standing   Patient Stated Goals "I want to get well and walk normally."   Currently in Pain? No/denies            [facility] PT Assessment - 04/25/16 1230      Ambulation/Gait   Ambulation/Gait Yes   Ambulation/Gait Assistance 6: Modified independent (Device/Increase time)   Ambulation Distance (Feet) 300 Feet  300' with cane & 125' without device   Assistive device Straight cane;None  quad tip on straight cane   Gait Pattern Decreased stride length;Decreased step length - right;Decreased step length - left;Decreased stance time - left;Lateral trunk lean to left;Trunk flexed;Narrow base of support;Step-to pattern   Ambulation Surface Indoor;Level   Gait velocity 2.05 ft/sec   initially was 1.51 ft/sec     Berg Balance Test   Sit to Stand Able to stand without using hands and stabilize independently   Standing Unsupported Able to stand safely 2 minutes   Sitting with Back Unsupported but Feet Supported on Floor or Stool Able to sit safely and securely 2 minutes   Stand to Sit Sits safely with minimal use of hands   Transfers Able to transfer safely, minor use of hands   Standing Unsupported with Eyes Closed Able to stand 10 seconds safely   Standing Ubsupported with Feet Together Able to place feet together independently and stand  1 minute safely   From Standing, Reach Forward with Outstretched Arm Can reach confidently >25 cm (10")   From Standing Position, Pick up Object from [place] to pick up shoe safely and easily   From Standing Position, Turn to Look Behind Over each Shoulder Looks behind one side only/other side shows less weight shift   Turn 360 Degrees Able to turn 360 degrees safely one side only in 4 seconds or less   Standing Unsupported, Alternately Place Feet on Step/Stool Able to complete 4 steps without aid or supervision   Standing Unsupported, One Foot in Front Able to plae foot ahead of the other  independently and hold 30 seconds   Standing on One Leg Able to lift leg independently and hold 5-10 seconds   Total Score 50     Timed Up and Go Test   Normal TUG (seconds) 12.57  12.57sec with cane, 10.80sec without device                     [facility] Adult PT Treatment/Exercise - 04/25/16 1230      Ambulation/Gait   Stairs Yes   Stairs Assistance 6: Modified independent (Device/Increase time)   Stair Management Technique One rail Right;With cane;Step to pattern;Forwards   Number of Stairs 4   Ramp 6: Modified independent (Device)  cane with quad tip   Curb 6: Modified independent (Device/increase time)  cane with quad tip                PT Education - 04/25/16 1230    Education provided Yes   Education Details continuing ongoing HEP / fitness program   Person(s) Educated Patient   Methods Explanation   Comprehension Verbalized understanding          PT Short Term Goals - 03/26/16 1230      PT SHORT TERM GOAL #1   Title Pt will be independent with intial HEP to address balance, flexibility, and weakness. (Target Date: 03/30/16)   Baseline MET 03/26/2016   Time 4   Period Weeks   Status Achieved     PT SHORT TERM GOAL #2   Title Pt will be able to safely reach 4" without balance loss. (Target Date: 03/30/16)   Baseline MET 03/26/2016   Time 4   Period Weeks   Status Achieved     PT SHORT TERM GOAL #3   Title Pt will be able to safely ambulate 150 ft with rollator. (Target Date: 03/30/16)   Baseline MET 03/26/2016 with cane   Time 4   Period Weeks   Status Achieved     PT SHORT TERM GOAL #4   Title Pt verbalizes an understanding of modifying activity level including AD's for pain management. (Target Date: 03/30/16)   Baseline MET 03/26/2016   Time 4   Period Weeks   Status Achieved           PT Long Term Goals - 04/25/16 2046      PT LONG TERM GOAL #1   Title Pt verbalizes and demonstrates understanding of ongoing HEP/fitness plan  (Target Date: 04/27/16   Baseline MET 04/25/2016   Time 8   Period Weeks   Status Achieved     PT LONG TERM GOAL #2   Title Pt reports pain increases less than 2 increments on the 0-10 scale with standing and gait activites. (Target Date: 04/27/16)   Baseline MET 04/25/2016   Time 8   Period Weeks   Status  Achieved     PT LONG TERM GOAL #3   Title Pt will improve BERG Balance to >/= 52/56 to indicate lower fall risk. (Target Date:04/27/16)   Baseline Partially MET 04/25/2016 50/56   Status Partially Met     PT LONG TERM GOAL #4   Title Pt will improve TUG score to < 16.5 seconds safely to indicate lower fall risk. (Target Date: 04/27/16)   Baseline MET 04/25/2016   TUG 12.57sec with cane & 10.80sec without device   Time 8   Period Weeks   Status Achieved     PT LONG TERM GOAL #5   Title Pt will ambulate 300 ft including negotiating ramps and curbs with LRAD to enable community mobility. (Target Date: 04/27/16)   Baseline MET 04/25/2016 with single cane with quad tip   Time 8   Period Weeks   Status Achieved     PT LONG TERM GOAL #6   Title Pt will be able to navigate 171f around furniture without an AD mod I to enable household mobility. (Target Date: 04/27/16)   Baseline MET 04/25/2016   Time 8   Period Weeks   Status Achieved               Plan - 04/25/16 2049    Clinical Impression Statement Patient met or partially met all LTGs.    Rehab Potential Good   Clinical Impairments Affecting Rehab Potential Hard of hearing, cognitive deficits, R eye blindness   PT Frequency 2x / week   PT Duration 8 weeks   PT Treatment/Interventions ADLs/Self Care Home Management;Gait training;Stair training;Functional mobility training;Neuromuscular re-education;Balance training;Therapeutic exercise;Therapeutic activities;Manual techniques;Patient/family education;Orthotic Fit/Training;Visual/perceptual remediation/compensation;Energy conservation   PT Next Visit Plan discharge    PT  Home Exercise Plan OTAGO   Consulted and Agree with Plan of Care Patient      Patient will benefit from skilled therapeutic intervention in order to improve the following deficits and impairments:  Abnormal gait, Decreased activity tolerance, Decreased balance, Decreased cognition, Decreased mobility, Decreased endurance, Decreased range of motion, Decreased knowledge of use of DME, Decreased strength, Impaired sensation, Impaired flexibility, Postural dysfunction, Impaired vision/preception, Pain  Visit Diagnosis: Muscle weakness (generalized)  Unsteadiness on feet  Abnormal posture  Other abnormalities of gait and mobility     Problem List Patient Active Problem List   Diagnosis Date Noted  . Mild cognitive impairment with memory loss 09/02/2015  . Impaired functional mobility, balance, gait, and endurance 09/02/2015  . Hearing impairment 09/02/2015  . 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 Skin nodule 09/13/2014  . Osteopenia 08/30/2014  . Diffuse pain 04/07/2014  . Numbness in both legs 10/16/2012  . Constipation 06/04/2012  . Hypercholesteremia 09/05/2011  . Osteoarthritis 04/12/2011  . 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 Hypothyroidism 07/12/2009  . ANXIETY 07/12/2009  . INSOMNIA, PERSISTENT 07/12/2009  . Essential hypertension 07/12/2009  . History of cerebrovascular accident 07/12/2009    WJamey ReasPT, DPT 04/25/2016, 8:52 PM  [place] [address]Phone: [phone]  Fax:  [phone] Name: Yvonne BOISEMRN: [id number] of Birth: 1February 26, 1942

## 2016-05-09 ENCOUNTER — Other Ambulatory Visit: Payer: Self-pay | Admitting: Family Medicine

## 2016-05-09 MED ORDER — AMITRIPTYLINE HCL 50 MG PO TABS: 150.0000 mg | ORAL_TABLET | Freq: Every day | ORAL | 3 refills | Status: DC

## 2016-05-09 NOTE — Telephone Encounter (Signed)
Received a letter in the mail from patient wanting to know if the CT scan she had in March 2017 showed she had a stroke. She also is requesting a refill on "sleeping pills". Please advise.

## 2016-06-19 DIAGNOSIS — M25562 Pain in left knee: Secondary | ICD-10-CM | POA: Diagnosis not present

## 2016-06-19 DIAGNOSIS — R262 Difficulty in walking, not elsewhere classified: Secondary | ICD-10-CM | POA: Diagnosis not present

## 2016-06-19 DIAGNOSIS — M1712 Unilateral primary osteoarthritis, left knee: Secondary | ICD-10-CM | POA: Diagnosis not present

## 2016-07-04 ENCOUNTER — Ambulatory Visit: Payer: Commercial Managed Care - HMO | Admitting: Family Medicine

## 2016-07-16 DIAGNOSIS — Z1231 Encounter for screening mammogram for malignant neoplasm of breast: Secondary | ICD-10-CM | POA: Diagnosis not present

## 2016-07-23 ENCOUNTER — Encounter: Payer: Self-pay | Admitting: *Deleted

## 2016-07-25 ENCOUNTER — Encounter: Payer: Self-pay | Admitting: Family Medicine

## 2016-07-25 ENCOUNTER — Ambulatory Visit (INDEPENDENT_AMBULATORY_CARE_PROVIDER_SITE_OTHER): Payer: Medicare HMO | Admitting: Family Medicine

## 2016-07-25 VITALS — BP 110/60 | HR 85 | Temp 97.5°F | Ht 61.0 in | Wt 149.0 lb

## 2016-07-25 DIAGNOSIS — I10 Essential (primary) hypertension: Secondary | ICD-10-CM

## 2016-07-25 DIAGNOSIS — R200 Anesthesia of skin: Secondary | ICD-10-CM | POA: Diagnosis not present

## 2016-07-25 DIAGNOSIS — M1612 Unilateral primary osteoarthritis, left hip: Secondary | ICD-10-CM | POA: Diagnosis not present

## 2016-07-25 DIAGNOSIS — I062 Rheumatic aortic stenosis with insufficiency: Secondary | ICD-10-CM | POA: Diagnosis not present

## 2016-07-25 DIAGNOSIS — Z8673 Personal history of transient ischemic attack (TIA), and cerebral infarction without residual deficits: Secondary | ICD-10-CM | POA: Diagnosis not present

## 2016-07-25 DIAGNOSIS — E038 Other specified hypothyroidism: Secondary | ICD-10-CM

## 2016-07-25 DIAGNOSIS — E7800 Pure hypercholesterolemia, unspecified: Secondary | ICD-10-CM

## 2016-07-25 LAB — COMPREHENSIVE METABOLIC PANEL
ALK PHOS: 71 U/L (ref 33–130)
ALT: 12 U/L (ref 6–29)
AST: 16 U/L (ref 10–35)
Albumin: 4 g/dL (ref 3.6–5.1)
BILIRUBIN TOTAL: 0.4 mg/dL (ref 0.2–1.2)
BUN: 16 mg/dL (ref 7–25)
CO2: 28 mmol/L (ref 20–31)
CREATININE: 0.83 mg/dL (ref 0.60–0.93)
Calcium: 9.5 mg/dL (ref 8.6–10.4)
Chloride: 103 mmol/L (ref 98–110)
GLUCOSE: 109 mg/dL — AB (ref 65–99)
Potassium: 3.8 mmol/L (ref 3.5–5.3)
SODIUM: 142 mmol/L (ref 135–146)
TOTAL PROTEIN: 6.8 g/dL (ref 6.1–8.1)

## 2016-07-25 LAB — CBC
HCT: 40.3 % (ref 35.0–45.0)
HEMOGLOBIN: 13.3 g/dL (ref 11.7–15.5)
MCH: 29.9 pg (ref 27.0–33.0)
MCHC: 33 g/dL (ref 32.0–36.0)
MCV: 90.6 fL (ref 80.0–100.0)
MPV: 9.5 fL (ref 7.5–12.5)
PLATELETS: 370 10*3/uL (ref 140–400)
RBC: 4.45 MIL/uL (ref 3.80–5.10)
RDW: 13.8 % (ref 11.0–15.0)
WBC: 9.1 10*3/uL (ref 3.8–10.8)

## 2016-07-25 LAB — LIPID PANEL
Cholesterol: 163 mg/dL (ref <200)
HDL: 52 mg/dL (ref >50)
LDL CALC: 73 mg/dL (ref <100)
Total CHOL/HDL Ratio: 3.1 Ratio (ref <5.0)
Triglycerides: 191 mg/dL — ABNORMAL HIGH (ref <150)
VLDL: 38 mg/dL — AB (ref <30)

## 2016-07-25 LAB — TSH: TSH: 1.82 mIU/L

## 2016-07-25 MED ORDER — LEVOTHYROXINE SODIUM 75 MCG PO TABS: 75.0000 ug | ORAL_TABLET | Freq: Every day | ORAL | 3 refills | Status: DC

## 2016-07-25 MED ORDER — LIDOCAINE-PRILOCAINE 2.5-2.5 % EX CREA: TOPICAL_CREAM | CUTANEOUS | 3 refills | Status: DC

## 2016-07-25 MED ORDER — AMLODIPINE BESYLATE 5 MG PO TABS: 5.0000 mg | ORAL_TABLET | Freq: Every day | ORAL | 3 refills | Status: DC

## 2016-07-25 MED ORDER — OMEGA-3-ACID ETHYL ESTERS 1 G PO CAPS: 1.0000 g | ORAL_CAPSULE | Freq: Two times a day (BID) | ORAL | 3 refills | Status: DC

## 2016-07-25 MED ORDER — BENAZEPRIL-HYDROCHLOROTHIAZIDE 20-25 MG PO TABS: 1.0000 | ORAL_TABLET | Freq: Every day | ORAL | 3 refills | Status: DC

## 2016-07-25 MED ORDER — ATORVASTATIN CALCIUM 10 MG PO TABS: 10.0000 mg | ORAL_TABLET | Freq: Every day | ORAL | 3 refills | Status: DC

## 2016-07-25 NOTE — Assessment & Plan Note (Signed)
Continue Lotensin HCTZ combination

## 2016-07-25 NOTE — Assessment & Plan Note (Signed)
Continue EMLA, use cane, fall prevention reviewed

## 2016-07-25 NOTE — Assessment & Plan Note (Signed)
Continue Atorvastatin, lipids today

## 2016-07-25 NOTE — Assessment & Plan Note (Signed)
No new symptoms.

## 2016-07-25 NOTE — Patient Instructions (Signed)
Fall Prevention in the Home Falls can cause injuries and can affect people from all age groups. There are many simple things that you can do to make your home safe and to help prevent falls. What can I do on the outside of my home?  Regularly repair the edges of walkways and driveways and fix any cracks.  Remove high doorway thresholds.  Trim any shrubbery on the main path into your home.  Use bright outdoor lighting.  Clear walkways of debris and clutter, including tools and rocks.  Regularly check that handrails are securely fastened and in good repair. Both sides of any steps should have handrails.  Install guardrails along the edges of any raised decks or porches.  Have leaves, snow, and ice cleared regularly.  Use sand or salt on walkways during winter months.  In the garage, clean up any spills right away, including grease or oil spills. What can I do in the bathroom?  Use night lights.  Install grab bars by the toilet and in the tub and shower. Do not use towel bars as grab bars.  Use non-skid mats or decals on the floor of the tub or shower.  If you need to sit down while you are in the shower, use a plastic, non-slip stool.  Keep the floor dry. Immediately clean up any water that spills on the floor.  Remove soap buildup in the tub or shower on a regular basis.  Attach bath mats securely with double-sided non-slip rug tape.  Remove throw rugs and other tripping hazards from the floor. What can I do in the bedroom?  Use night lights.  Make sure that a bedside light is easy to reach.  Do not use oversized bedding that drapes onto the floor.  Have a firm chair that has side arms to use for getting dressed.  Remove throw rugs and other tripping hazards from the floor. What can I do in the kitchen?  Clean up any spills right away.  Avoid walking on wet floors.  Place frequently used items in easy-to-reach places.  If you need to reach for something above  you, use a sturdy step stool that has a grab bar.  Keep electrical cables out of the way.  Do not use floor polish or wax that makes floors slippery. If you have to use wax, make sure that it is non-skid floor wax.  Remove throw rugs and other tripping hazards from the floor. What can I do in the stairways?  Do not leave any items on the stairs.  Make sure that there are handrails on both sides of the stairs. Fix handrails that are broken or loose. Make sure that handrails are as long as the stairways.  Check any carpeting to make sure that it is firmly attached to the stairs. Fix any carpet that is loose or worn.  Avoid having throw rugs at the top or bottom of stairways, or secure the rugs with carpet tape to prevent them from moving.  Make sure that you have a light switch at the top of the stairs and the bottom of the stairs. If you do not have them, have them installed. What are some other fall prevention tips?  Wear closed-toe shoes that fit well and support your feet. Wear shoes that have rubber soles or low heels.  When you use a stepladder, make sure that it is completely opened and that the sides are firmly locked. Have someone hold the ladder while you are using   it. Do not climb a closed stepladder.  Add color or contrast paint or tape to grab bars and handrails in your home. Place contrasting color strips on the first and last steps.  Use mobility aids as needed, such as canes, walkers, scooters, and crutches.  Turn on lights if it is dark. Replace any light bulbs that burn out.  Set up furniture so that there are clear paths. Keep the furniture in the same spot.  Fix any uneven floor surfaces.  Choose a carpet design that does not hide the edge of steps of a stairway.  Be aware of any and all pets.  Review your medicines with your healthcare provider. Some medicines can cause dizziness or changes in blood pressure, which increase your risk of falling. Talk with  your health care provider about other ways that you can decrease your risk of falls. This may include working with a physical therapist or trainer to improve your strength, balance, and endurance. This information is not intended to replace advice given to you by your health care provider. Make sure you discuss any questions you have with your health care provider. Document Released: 05/25/2002 Document Revised: 11/01/2015 Document Reviewed: 07/09/2014 Elsevier Interactive Patient Education  2017 [employer]  

## 2016-07-25 NOTE — Assessment & Plan Note (Signed)
Yearly labs

## 2016-07-25 NOTE — Assessment & Plan Note (Signed)
Continue atorvastatin Check CT to rule out a new lesion

## 2016-07-25 NOTE — Assessment & Plan Note (Signed)
Check CT

## 2016-07-25 NOTE — Progress Notes (Signed)
Subjective:    Patient ID: Yvonne [name] is a 76 y.o. female presenting with Follow-up (HTN)  on 07/25/2016  HPI: Here today for f/u. Patient has had significant pain in LE as well as numbness. She is having continued issues. She is using EMLA cream for pain. She is using a cane. She has stopped PT as it was not making significant impact. Reports having to keep her left foot flat when walking. 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 The MRI was canceled. She is still concerned she may have had a stroke. She needs some meds refilled.  Review of Systems  Constitutional: Negative for chills and fever.  Respiratory: Negative for shortness of breath.   Cardiovascular: Negative for chest pain.  Gastrointestinal: Negative for abdominal pain, nausea and vomiting.  Genitourinary: Negative for dysuria.  Skin: Negative for rash.      Objective:    BP 110/60   Pulse 85   Temp 97.5 F (36.4 C) (Oral)   Ht 5\' 1"  (1.549 m)   Wt 149 lb (67.6 kg)   SpO2 92%   BMI 28.15 kg/m  Physical Exam  Constitutional: She is oriented to person, place, and time. She appears well-developed and well-nourished. No distress.  HENT:  Head: Normocephalic and atraumatic.  Eyes: No scleral icterus.  Neck: Neck supple.  Cardiovascular: Normal rate.   Pulmonary/Chest: Effort normal.  Abdominal: Soft.  Neurological: She is alert and oriented to person, place, and time.  Skin: Skin is warm and dry.  Psychiatric: She has a normal mood and affect.        Assessment & Plan:   Problem List Items Addressed This Visit      Unprioritized   Hypothyroidism    Yearly labs      Relevant Medications   levothyroxine (SYNTHROID, LEVOTHROID) 75 MCG tablet   Other Relevant Orders   TSH   Essential hypertension - Primary    Continue Lotensin HCTZ combination      Relevant Medications   amLODipine (NORVASC) 5 MG tablet   atorvastatin (LIPITOR) 10 MG tablet   benazepril-hydrochlorthiazide (LOTENSIN HCT) 20-25 MG tablet   omega-3 acid ethyl esters (LOVAZA) 1 g capsule   Other Relevant Orders   Comprehensive metabolic panel   Aortic valve stenosis and insufficiency, rheumatic    No new symptoms      Relevant Medications   amLODipine (NORVASC) 5 MG tablet   atorvastatin (LIPITOR) 10 MG tablet   benazepril-hydrochlorthiazide (LOTENSIN HCT) 20-25 MG tablet   omega-3 acid ethyl esters (LOVAZA) 1 g capsule   Other Relevant Orders   CBC   History of cerebrovascular accident    Continue atorvastatin Check CT to rule out a new lesion      Relevant Medications   omega-3 acid ethyl esters (LOVAZA) 1 g capsule   Other Relevant Orders   CT Head Wo Contrast   Osteoarthritis    Continue EMLA, use cane, fall prevention reviewed      Relevant Medications   lidocaine-prilocaine (EMLA) cream   Hypercholesteremia    Continue Atorvastatin, lipids today      Relevant Medications   amLODipine (NORVASC) 5 MG tablet   atorvastatin (LIPITOR) 10 MG tablet   benazepril-hydrochlorthiazide (LOTENSIN HCT) 20-25 MG tablet   omega-3 acid ethyl esters (LOVAZA) 1 g capsule   Other Relevant Orders   Lipid panel   Numbness in  both legs    Check CT      Relevant Orders   CT Head Wo Contrast      Total face-to-face time with patient: 25 minutes. Over 50% of encounter was spent on counseling and coordination of care. Return in about 3 months (around 10/22/2016).  Donnamae Jude 07/25/2016 11:42 AM

## 2016-07-26 ENCOUNTER — Encounter: Payer: Self-pay | Admitting: Family Medicine

## 2016-08-01 ENCOUNTER — Ambulatory Visit (HOSPITAL_COMMUNITY)
Admission: RE | Admit: 2016-08-01 | Discharge: 2016-08-01 | Disposition: A | Discharge status: Home / Self Care | Payer: Medicare HMO | Source: Ambulatory Visit | Attending: Family Medicine | Admitting: Family Medicine

## 2016-08-01 DIAGNOSIS — R200 Anesthesia of skin: Secondary | ICD-10-CM | POA: Diagnosis not present

## 2016-08-01 DIAGNOSIS — Z9889 Other specified postprocedural states: Secondary | ICD-10-CM | POA: Insufficient documentation

## 2016-08-01 DIAGNOSIS — J349 Unspecified disorder of nose and nasal sinuses: Secondary | ICD-10-CM | POA: Insufficient documentation

## 2016-08-01 DIAGNOSIS — G9389 Other specified disorders of brain: Secondary | ICD-10-CM | POA: Insufficient documentation

## 2016-08-01 DIAGNOSIS — Z8673 Personal history of transient ischemic attack (TIA), and cerebral infarction without residual deficits: Secondary | ICD-10-CM | POA: Insufficient documentation

## 2016-08-13 ENCOUNTER — Other Ambulatory Visit: Payer: Self-pay | Admitting: *Deleted

## 2016-08-13 DIAGNOSIS — Z8673 Personal history of transient ischemic attack (TIA), and cerebral infarction without residual deficits: Secondary | ICD-10-CM

## 2016-08-13 DIAGNOSIS — M1612 Unilateral primary osteoarthritis, left hip: Secondary | ICD-10-CM

## 2016-08-13 MED ORDER — LIDOCAINE-PRILOCAINE 2.5-2.5 % EX CREA: TOPICAL_CREAM | CUTANEOUS | 3 refills | Status: DC

## 2016-08-13 MED ORDER — OMEGA-3-ACID ETHYL ESTERS 1 G PO CAPS: 1.0000 g | ORAL_CAPSULE | Freq: Two times a day (BID) | ORAL | 3 refills | Status: DC

## 2016-08-30 ENCOUNTER — Other Ambulatory Visit: Payer: Self-pay | Admitting: Family Medicine

## 2016-08-30 DIAGNOSIS — I10 Essential (primary) hypertension: Secondary | ICD-10-CM

## 2016-08-30 DIAGNOSIS — E7800 Pure hypercholesterolemia, unspecified: Secondary | ICD-10-CM

## 2016-08-30 DIAGNOSIS — E038 Other specified hypothyroidism: Secondary | ICD-10-CM

## 2016-09-12 ENCOUNTER — Other Ambulatory Visit: Payer: Self-pay | Admitting: *Deleted

## 2016-09-12 DIAGNOSIS — I10 Essential (primary) hypertension: Secondary | ICD-10-CM

## 2016-09-12 MED ORDER — AMLODIPINE BESYLATE 5 MG PO TABS: 5.0000 mg | ORAL_TABLET | Freq: Every day | ORAL | 3 refills | Status: DC

## 2016-09-26 DIAGNOSIS — M25562 Pain in left knee: Secondary | ICD-10-CM | POA: Diagnosis not present

## 2016-09-26 DIAGNOSIS — M1712 Unilateral primary osteoarthritis, left knee: Secondary | ICD-10-CM | POA: Diagnosis not present

## 2016-09-26 DIAGNOSIS — R262 Difficulty in walking, not elsewhere classified: Secondary | ICD-10-CM | POA: Diagnosis not present

## 2016-09-27 DIAGNOSIS — H5010 Unspecified exotropia: Secondary | ICD-10-CM | POA: Diagnosis not present

## 2016-09-27 DIAGNOSIS — H2511 Age-related nuclear cataract, right eye: Secondary | ICD-10-CM | POA: Diagnosis not present

## 2016-09-27 DIAGNOSIS — H26492 Other secondary cataract, left eye: Secondary | ICD-10-CM | POA: Diagnosis not present

## 2016-09-27 DIAGNOSIS — Z9889 Other specified postprocedural states: Secondary | ICD-10-CM | POA: Diagnosis not present

## 2016-09-27 DIAGNOSIS — H04123 Dry eye syndrome of bilateral lacrimal glands: Secondary | ICD-10-CM | POA: Diagnosis not present

## 2016-09-27 DIAGNOSIS — Z961 Presence of intraocular lens: Secondary | ICD-10-CM | POA: Diagnosis not present

## 2016-10-02 DIAGNOSIS — M1712 Unilateral primary osteoarthritis, left knee: Secondary | ICD-10-CM | POA: Diagnosis not present

## 2016-10-02 DIAGNOSIS — M25562 Pain in left knee: Secondary | ICD-10-CM | POA: Diagnosis not present

## 2016-10-09 DIAGNOSIS — M25562 Pain in left knee: Secondary | ICD-10-CM | POA: Diagnosis not present

## 2016-10-09 DIAGNOSIS — M1712 Unilateral primary osteoarthritis, left knee: Secondary | ICD-10-CM | POA: Diagnosis not present

## 2016-10-16 DIAGNOSIS — M1712 Unilateral primary osteoarthritis, left knee: Secondary | ICD-10-CM | POA: Diagnosis not present

## 2016-10-16 DIAGNOSIS — M25562 Pain in left knee: Secondary | ICD-10-CM | POA: Diagnosis not present

## 2016-10-24 ENCOUNTER — Encounter: Payer: Self-pay | Admitting: Family Medicine

## 2016-10-24 ENCOUNTER — Ambulatory Visit (INDEPENDENT_AMBULATORY_CARE_PROVIDER_SITE_OTHER): Payer: Medicare HMO | Admitting: Family Medicine

## 2016-10-24 VITALS — BP 106/58 | HR 92 | Temp 97.9°F | Wt 140.0 lb

## 2016-10-24 DIAGNOSIS — I10 Essential (primary) hypertension: Secondary | ICD-10-CM

## 2016-10-24 DIAGNOSIS — Z7409 Other reduced mobility: Secondary | ICD-10-CM | POA: Diagnosis not present

## 2016-10-24 MED ORDER — AMLODIPINE BESYLATE 5 MG PO TABS: 5.0000 mg | ORAL_TABLET | Freq: Every day | ORAL | 3 refills | Status: DC

## 2016-10-24 NOTE — Assessment & Plan Note (Signed)
Significant fall risk--will check RPR and HIV. I have referred her to neurology, as she cannot have MRI imaging to see if they can offer any insight Fall risk discussed at length as well as her continuing to live alone and living will status. I am concerned about her continuing to be alone with these falls--she will discuss with her son, what makes sense for her ongoing situation.

## 2016-10-24 NOTE — Progress Notes (Signed)
   Subjective:    Patient ID: Yvonne [name] is a 76 y.o. female presenting with Follow-up (CT)  on 10/24/2016  HPI: Reports falling once/week.having a tough time getting her feet to move. Does not feel dizzy. No loss of urine. Feels like she is confused at times. Reports it may mostly be left hip that is hanging her up. Has seen ortho and has known arthritis of left hip and left knee. No improvement with PT or steroid injections. Falls have gotten progressively worse. Walking with a cane now. Reports having to stop every few minutes to rest before moving again. Continues to live at home alone. Just paid off car and will have increasing money for food. Has living will with her son she states. We did a CT of her head to determine if her gait was related to some central nervous system issue. The CT was stable. She is not a candidate for MRI due to metal in her head. Has remote h/o stroke, but walking fine until last six months or so and falls have increased. She was previously reporting some bilateral lower extremity numbness. Normal recent B12.  Review of Systems  Constitutional: Negative for chills and fever.  Respiratory: Negative for shortness of breath.   Cardiovascular: Negative for chest pain.  Gastrointestinal: Negative for abdominal pain, nausea and vomiting.  Genitourinary: Negative for dysuria.  Skin: Negative for rash.      Objective:    BP (!) 106/58   Pulse 92   Temp 97.9 F (36.6 C) (Oral)   Wt 140 lb (63.5 kg)   SpO2 96%   BMI 26.45 kg/m  Physical Exam  Constitutional: She is oriented to person, place, and time. She appears well-developed and well-nourished. No distress.  HENT:  Head: Normocephalic and atraumatic.  Eyes: No scleral icterus.  Neck: Neck supple.  Cardiovascular: Normal rate.   Pulmonary/Chest: Effort normal.  Abdominal: Soft.  Neurological: She is alert and oriented to person, place, and time. She has normal strength. Gait (slow, broad based)  abnormal.  No ataxia, normal finger to nose MMSE essentially normal with some minor cues.  Skin: Skin is warm and dry.  Psychiatric: She has a normal mood and affect.        Assessment & Plan:   Problem List Items Addressed This Visit      Unprioritized   Essential hypertension - Primary    Continue amlodipine and Lotensin HCTZ      Relevant Medications   amLODipine (NORVASC) 5 MG tablet   Impaired functional mobility, balance, gait, and endurance    Significant fall risk--will check RPR and HIV. I have referred her to neurology, as she cannot have MRI imaging to see if they can offer any insight Fall risk discussed at length as well as her continuing to live alone and living will status. I am concerned about her continuing to be alone with these falls--she will discuss with her son, what makes sense for her ongoing situation.      Relevant Orders   Ambulatory referral to Neurology   HIV antibody   RPR      Total face-to-face time with patient: 25 minutes. Over 50% of encounter was spent on counseling and coordination of care. Return in about 3 months (around 01/24/2017).  Donnamae Jude 10/24/2016 4:03 PM

## 2016-10-24 NOTE — Patient Instructions (Signed)
Fall Prevention in the Home Falls can cause injuries and can affect people from all age groups. There are many simple things that you can do to make your home safe and to help prevent falls. What can I do on the outside of my home?  Regularly repair the edges of walkways and driveways and fix any cracks.  Remove high doorway thresholds.  Trim any shrubbery on the main path into your home.  Use bright outdoor lighting.  Clear walkways of debris and clutter, including tools and rocks.  Regularly check that handrails are securely fastened and in good repair. Both sides of any steps should have handrails.  Install guardrails along the edges of any raised decks or porches.  Have leaves, snow, and ice cleared regularly.  Use sand or salt on walkways during winter months.  In the garage, clean up any spills right away, including grease or oil spills. What can I do in the bathroom?  Use night lights.  Install grab bars by the toilet and in the tub and shower. Do not use towel bars as grab bars.  Use non-skid mats or decals on the floor of the tub or shower.  If you need to sit down while you are in the shower, use a plastic, non-slip stool.  Keep the floor dry. Immediately clean up any water that spills on the floor.  Remove soap buildup in the tub or shower on a regular basis.  Attach bath mats securely with double-sided non-slip rug tape.  Remove throw rugs and other tripping hazards from the floor. What can I do in the bedroom?  Use night lights.  Make sure that a bedside light is easy to reach.  Do not use oversized bedding that drapes onto the floor.  Have a firm chair that has side arms to use for getting dressed.  Remove throw rugs and other tripping hazards from the floor. What can I do in the kitchen?  Clean up any spills right away.  Avoid walking on wet floors.  Place frequently used items in easy-to-reach places.  If you need to reach for something above  you, use a sturdy step stool that has a grab bar.  Keep electrical cables out of the way.  Do not use floor polish or wax that makes floors slippery. If you have to use wax, make sure that it is non-skid floor wax.  Remove throw rugs and other tripping hazards from the floor. What can I do in the stairways?  Do not leave any items on the stairs.  Make sure that there are handrails on both sides of the stairs. Fix handrails that are broken or loose. Make sure that handrails are as long as the stairways.  Check any carpeting to make sure that it is firmly attached to the stairs. Fix any carpet that is loose or worn.  Avoid having throw rugs at the top or bottom of stairways, or secure the rugs with carpet tape to prevent them from moving.  Make sure that you have a light switch at the top of the stairs and the bottom of the stairs. If you do not have them, have them installed. What are some other fall prevention tips?  Wear closed-toe shoes that fit well and support your feet. Wear shoes that have rubber soles or low heels.  When you use a stepladder, make sure that it is completely opened and that the sides are firmly locked. Have someone hold the ladder while you are using   it. Do not climb a closed stepladder.  Add color or contrast paint or tape to grab bars and handrails in your home. Place contrasting color strips on the first and last steps.  Use mobility aids as needed, such as canes, walkers, scooters, and crutches.  Turn on lights if it is dark. Replace any light bulbs that burn out.  Set up furniture so that there are clear paths. Keep the furniture in the same spot.  Fix any uneven floor surfaces.  Choose a carpet design that does not hide the edge of steps of a stairway.  Be aware of any and all pets.  Review your medicines with your healthcare provider. Some medicines can cause dizziness or changes in blood pressure, which increase your risk of falling. Talk with  your health care provider about other ways that you can decrease your risk of falls. This may include working with a physical therapist or trainer to improve your strength, balance, and endurance. This information is not intended to replace advice given to you by your health care provider. Make sure you discuss any questions you have with your health care provider. Document Released: 05/25/2002 Document Revised: 11/01/2015 Document Reviewed: 07/09/2014 Elsevier Interactive Patient Education  2017 [employer]  

## 2016-10-24 NOTE — Assessment & Plan Note (Signed)
Continue amlodipine and Lotensin HCTZ

## 2016-10-25 LAB — RPR: RPR Ser Ql: NONREACTIVE

## 2016-10-25 LAB — HIV ANTIBODY (ROUTINE TESTING W REFLEX): HIV Screen 4th Generation wRfx: NONREACTIVE

## 2016-12-15 ENCOUNTER — Other Ambulatory Visit: Payer: Self-pay | Admitting: Family Medicine

## 2016-12-15 MED ORDER — AMITRIPTYLINE HCL 50 MG PO TABS: 150.0000 mg | ORAL_TABLET | Freq: Every day | ORAL | 3 refills | Status: DC

## 2016-12-26 ENCOUNTER — Ambulatory Visit (INDEPENDENT_AMBULATORY_CARE_PROVIDER_SITE_OTHER): Payer: Medicare HMO

## 2016-12-26 ENCOUNTER — Ambulatory Visit (INDEPENDENT_AMBULATORY_CARE_PROVIDER_SITE_OTHER): Payer: Medicare HMO | Admitting: Podiatry

## 2016-12-26 DIAGNOSIS — M79672 Pain in left foot: Secondary | ICD-10-CM | POA: Diagnosis not present

## 2016-12-26 DIAGNOSIS — R2681 Unsteadiness on feet: Secondary | ICD-10-CM | POA: Diagnosis not present

## 2016-12-26 DIAGNOSIS — M79671 Pain in right foot: Secondary | ICD-10-CM

## 2017-01-06 NOTE — Progress Notes (Signed)
   HPI: Patient presents today as a new patient for complaint of bilateral forefoot numbness. She states that when she walks she is very unsteady and is concerned that she may fall. 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 Patient states that she gets excessive numbness and tingling in the bilateral forefoot.   Physical Exam: General: The patient is alert and oriented x3 in no acute distress.  Dermatology: Skin is warm, dry and supple bilateral lower extremities. Negative for open lesions or macerations.  Vascular: Palpable pedal pulses bilaterally. No edema or erythema noted. Capillary refill within normal limits.  Neurological: Epicritic and protective threshold grossly intact bilaterally.   Musculoskeletal Exam: Range of motion within normal limits to all pedal and ankle joints bilateral. Muscle strength 5/5 in all groups bilateral.   Radiographic Exam:  DJD noted throughout the pedal and ankle joints of the bilateral lower extremities. There is history of second ray amputation right foot that appears stable.   Assessment: 1. Gait instability bilateral feet 2. History of second ray amputation right foot   Plan of Care:  1. Patient was evaluated. X-rays reviewed today 2. Today were going to arrange an appointment with Liliane Channel, Pedorthist performed for balance bracing to the bilateral feet to see if it can help provide her with stability. 3. Return to clinic when necessary   Edrick Kins, DPM [facility]  Dr. Edrick Kins, DPM    [address], [place] [address]                Office [phone]  Fax [phone]

## 2017-01-07 ENCOUNTER — Ambulatory Visit: Payer: Commercial Managed Care - HMO | Admitting: Orthotics

## 2017-01-07 DIAGNOSIS — R2681 Unsteadiness on feet: Secondary | ICD-10-CM

## 2017-01-07 DIAGNOSIS — M79671 Pain in right foot: Secondary | ICD-10-CM

## 2017-01-07 DIAGNOSIS — M79672 Pain in left foot: Secondary | ICD-10-CM

## 2017-01-07 NOTE — Progress Notes (Signed)
Patient presents with significant gait instability w/ hx of 2nd ray amputation.  Patient reports have fallen several times during the last year due to imbalance.   Shoe size is 69m.   Attempts were made to assess gait and balance; however she wasn't able to execute properly any of the assessment tools/evaluations.  Same was cast for [facility] Brace. She

## 2017-01-10 IMAGING — DX DG SKULL 1-3V
2 series · 2 of 2 positions shown · non-contrast
Comparison: None.

CLINICAL DATA: Pre MRI clearance.  History of brain surgery.

EXAM:
SKULL - 1-3 VIEW

[skull towns]
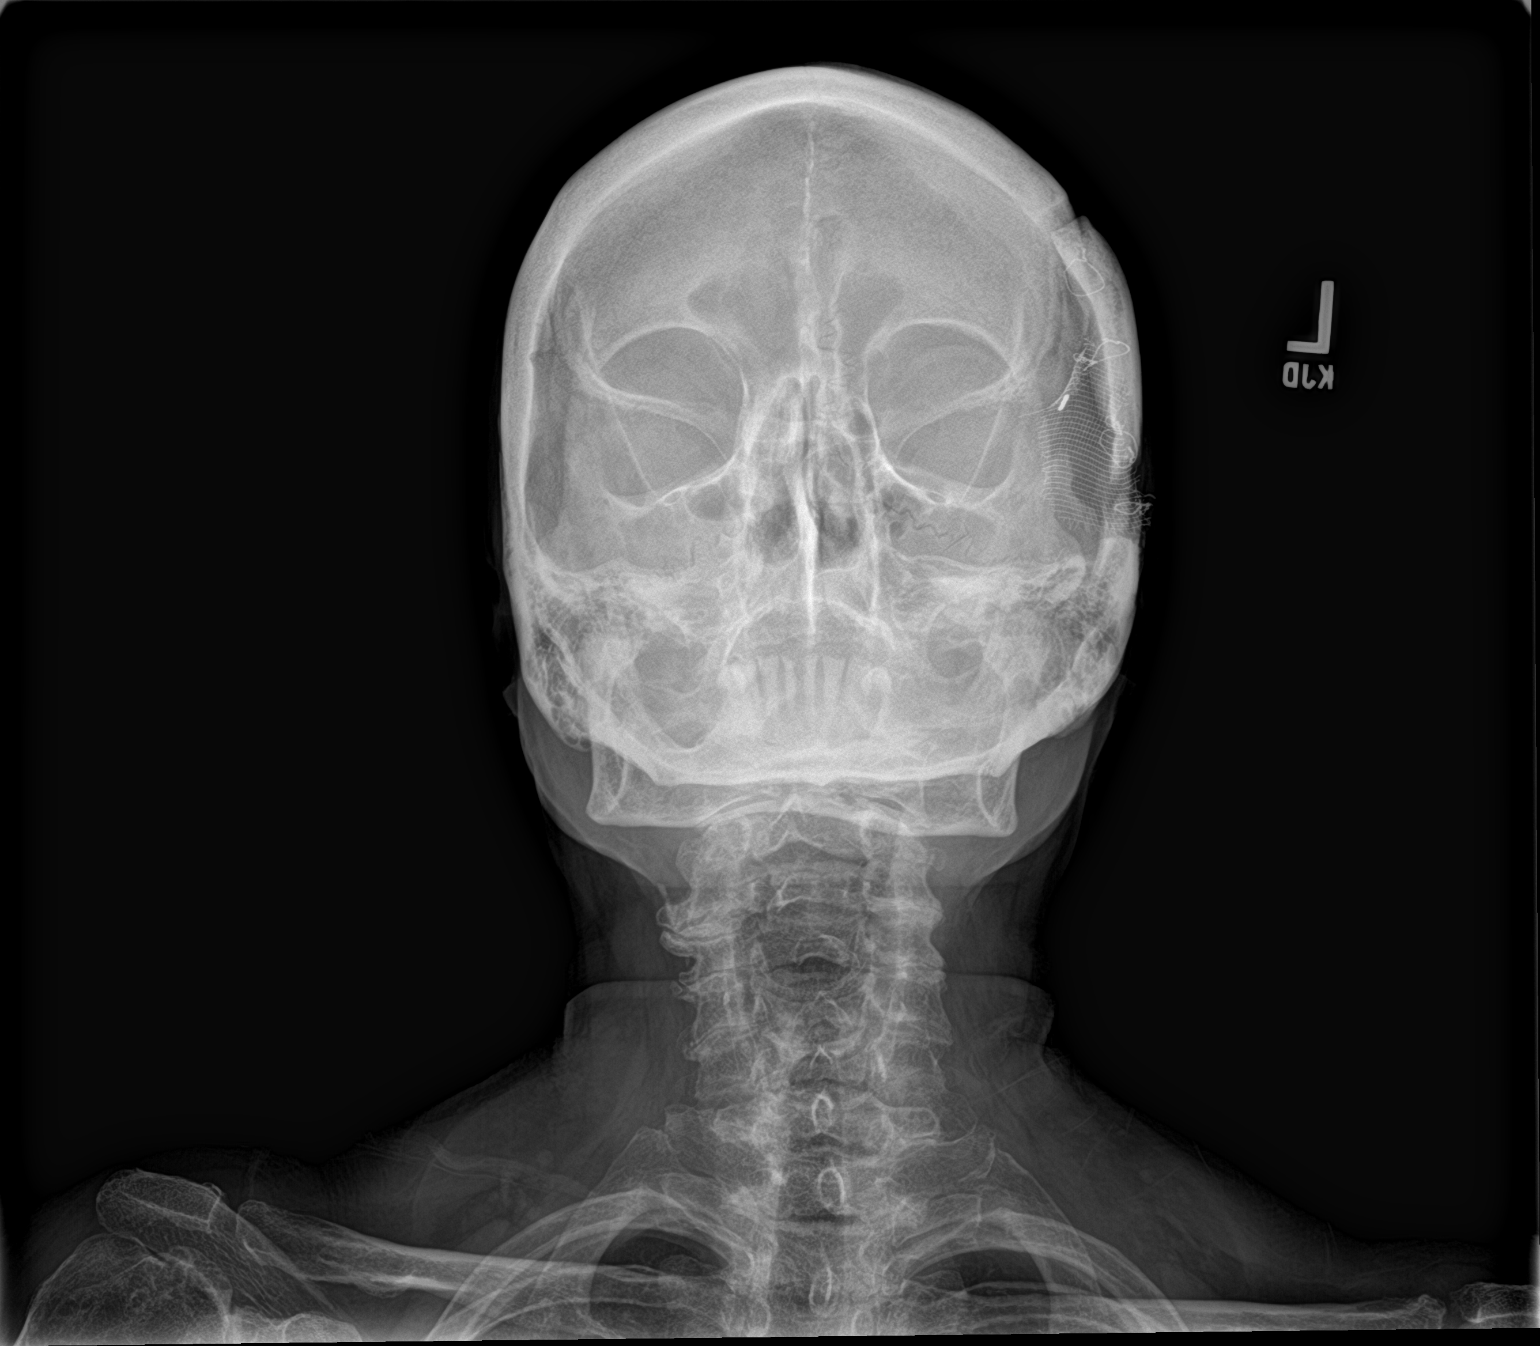

[skull lat]
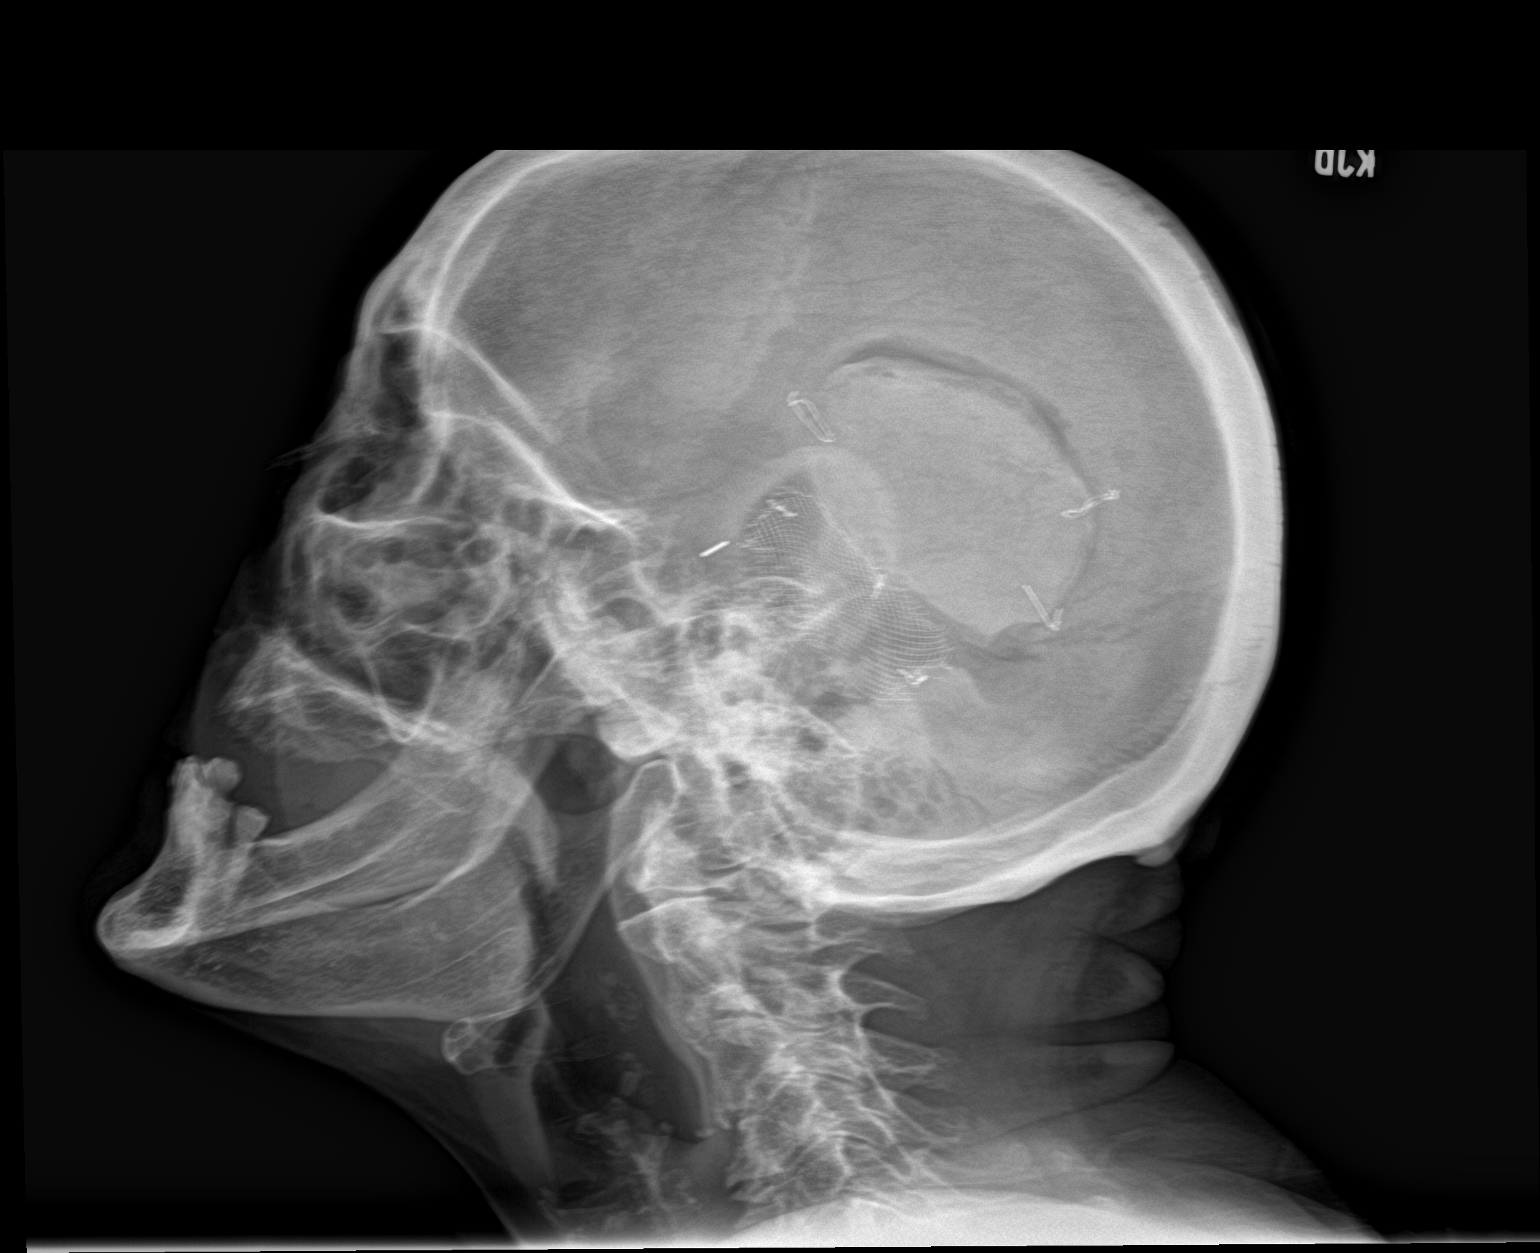

[2 of 2 positions shown; findings below may reference images not displayed]

FINDINGS: There is a left parietal craniotomy site with closure by a multiple
metallic cerclage wires. Anteroinferiorly, there is cranioplasty
mesh. Underlying this site there is a single surgical clip that
appears to be intracranial.
IMPRESSION: 1. Intracranial surgical clip near the expected location of the left
temporal lobe. Head CT is recommended for more accurate
determination of the precise location of this clip before MRI is
attempted.
2. Findings of prior left parietal craniotomy with cerclage closure
wires and cranioplasty mesh should not present a contraindication to
MRI.

## 2017-01-14 ENCOUNTER — Telehealth: Payer: Self-pay | Admitting: Podiatry

## 2017-01-30 ENCOUNTER — Ambulatory Visit (INDEPENDENT_AMBULATORY_CARE_PROVIDER_SITE_OTHER): Payer: Medicare HMO | Admitting: Orthopaedic Surgery

## 2017-01-30 ENCOUNTER — Encounter (INDEPENDENT_AMBULATORY_CARE_PROVIDER_SITE_OTHER): Payer: Self-pay | Admitting: Orthopaedic Surgery

## 2017-01-30 VITALS — Ht 61.0 in | Wt 140.0 lb

## 2017-01-30 DIAGNOSIS — G8929 Other chronic pain: Secondary | ICD-10-CM | POA: Insufficient documentation

## 2017-01-30 DIAGNOSIS — M545 Low back pain: Secondary | ICD-10-CM | POA: Diagnosis not present

## 2017-01-30 DIAGNOSIS — M1612 Unilateral primary osteoarthritis, left hip: Secondary | ICD-10-CM

## 2017-01-30 DIAGNOSIS — M25552 Pain in left hip: Secondary | ICD-10-CM | POA: Diagnosis not present

## 2017-01-30 NOTE — Progress Notes (Signed)
Office Visit Note   Patient: Yvonne [name]           Date of Birth: 09-Jan-1941           MRN: [id number] Visit Date: 01/30/2017              Requested by: Donnamae Jude, MD [place], [place] [address] PCP: Donnamae Jude, MD   Assessment & Plan: Visit Diagnoses:  1. Chronic left-sided low back pain without sciatica   2. Pain of left hip joint   3. Unilateral primary osteoarthritis, left hip     Plan: She does seem somewhat convinced that a back brace to help her walk better although I let her know that I would probably not be the case. I would like to set her up for an intra-articular injection in her left hip and certainly a brace wouldn't hurt anything on her back however I do feel that a replacement help her walk better. She then said she like to have a hip replacement but I do feel that we need to be low but cautiously go slower with her first trying some conservative treatment measures such as an intra-articular steroid injection. She then agreed to this. We'll see her back in 4 weeks after hopefully having had an injection sometime next week or 2.  Follow-Up Instructions: Return in about 4 weeks (around 02/27/2017).   Orders:  No orders of the defined types were placed in this encounter.  No orders of the defined types were placed in this encounter.     Procedures: No procedures performed   Clinical Data: No additional findings.   Subjective: Chief Complaint  Patient presents with  . Left Hip - Pain  . Lower Back - Pain  The patient is very pleasant 76 year old I'm seeing for the first time she reportedly has known hip arthritis but feels convinced this is mainly from her back. She was told by a "another woman" that she needed a back brace to help her walk better. She has had a history of stroke affecting her right side in the past. She does use a cane. Her pain is daily and is been slowly getting worse. She lives alone.  HPI  Review of  Systems She currently denies any headache, chest pain, shortness of breath, fever, chills, nausea, vomiting.  Objective: Vital Signs: Ht 5\' 1"  (1.549 m)   Wt 140 lb (63.5 kg)   BMI 26.45 kg/m   Physical Exam She is alert and oriented in no acute distress Ortho Exam Examination of her right hip is normal. Examination of her left hip shows limitations of internal right rotation and some pain. She has good flexion-extension of her lumbar spine. She does walk with a significant limp. Specialty Comments:  No specialty comments available.  Imaging: No results found. 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  PMFS History: Patient Active Problem List   Diagnosis Date Noted  . Chronic left-sided low back pain without sciatica 01/30/2017  . Pain of left hip joint 01/30/2017  . Unilateral primary osteoarthritis, left hip 01/30/2017  . Mild cognitive impairment with memory loss 09/02/2015  . Impaired functional mobility, balance, gait, and endurance 09/02/2015  . Hearing impairment 09/02/2015  . 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 Skin nodule 09/13/2014  . Osteopenia 08/30/2014  . Diffuse pain 04/07/2014  .  Numbness in both legs 10/16/2012  . Constipation 06/04/2012  . Hypercholesteremia 09/05/2011  . Osteoarthritis 04/12/2011  . 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 Hypothyroidism 07/12/2009  . ANXIETY 07/12/2009  . INSOMNIA, PERSISTENT 07/12/2009  . Essential hypertension 07/12/2009  . History of cerebrovascular accident 07/12/2009   Past Medical History:  Diagnosis Date  . Allergy   . Hypertension   . Impaired functional mobility, balance, gait, and endurance 09/02/2015  . Mild cognitive impairment with memory loss 09/02/2015  . Stroke [facility])    pt was 40  . Thyroid disease     Family History  Problem Relation Age of Onset  . Thyroid disease Mother    . Heart disease Father     Past Surgical History:  Procedure Laterality Date  . ABDOMINAL HYSTERECTOMY    . BRAIN SURGERY    . TOE AMPUTATION  2013   2nd toe on right foot, hammer toe   Social History   Occupational History  . Retired-teacher Retired   Social History Main Topics  . Smoking status: Never Smoker  . Smokeless tobacco: Never Used  . Alcohol use No  . Drug use: No  . Sexual activity: Not on file

## 2017-02-04 ENCOUNTER — Encounter: Payer: Self-pay | Admitting: Cardiovascular Disease

## 2017-02-04 ENCOUNTER — Ambulatory Visit (INDEPENDENT_AMBULATORY_CARE_PROVIDER_SITE_OTHER): Payer: Medicare HMO | Admitting: Cardiovascular Disease

## 2017-02-04 ENCOUNTER — Encounter (INDEPENDENT_AMBULATORY_CARE_PROVIDER_SITE_OTHER): Payer: Self-pay

## 2017-02-04 VITALS — BP 130/80 | HR 92 | Ht 61.0 in | Wt 145.4 lb

## 2017-02-04 DIAGNOSIS — I779 Disorder of arteries and arterioles, unspecified: Secondary | ICD-10-CM | POA: Diagnosis not present

## 2017-02-04 DIAGNOSIS — I10 Essential (primary) hypertension: Secondary | ICD-10-CM

## 2017-02-04 DIAGNOSIS — I739 Peripheral vascular disease, unspecified: Principal | ICD-10-CM

## 2017-02-04 HISTORY — DX: Disorder of arteries and arterioles, unspecified: I77.9

## 2017-02-04 NOTE — Progress Notes (Signed)
Yvonne [name] Date of Birth  Nov 26, 1940 [place] [address]    [place]   [place], [place]  [address]    [place], [place]  [address] [phone]  Fax  [phone]  [phone]  Fax [phone]   Problem List: 1. Mild AS / AI  2. Hypothyroidism 3. Hypertension 4. Toe amputation for hammer toe 5. History of stroke- occluded left carotid, mild right carotid disease 6. CVA - age 76   History of Present Illness:  Pt is a 76 y.o. female with a hx of stroke at age 34.  She continues to have problems with her right arm.  She has had a heart murmur for years.  She has not had any recent problems.  She has a hx of HTN.  She admits to eating extra salt on occasion.  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 This corresponds with her previous stroke. There was no significant disease on right carotid. An echocardiogram revealed normal left ventricular systolic function.  She has mild aortic stenosis and aortic insufficiency which explains her heart murmur.  She eats lots of salty meals.  She eats prepared lunch and dinners every day.  She eats popcorn every night.    December 08, 2014:    Yvonne [name] is seen back after a 3 year absence.  Still eats some salt .  She is doing well.  She fell the other day.   misstepped on the cement.    December 12, 2015:  Doing well. Is now walking with a walker  Doing well from a cardiac standpoint .  Aug. 20, 2018  Yvonne [name] is seen today  Is walking with a walker for the past year BP looks good today    Current Outpatient Prescriptions on File Prior to Visit  Medication Sig Dispense Refill  . amitriptyline (ELAVIL) 50 MG tablet Take 3 tablets (150 mg total) by mouth at bedtime. 270 tablet 3  . amLODipine (NORVASC) 5 MG tablet Take 1 tablet (5 mg total) by mouth daily. 90 tablet 3  . aspirin 81 MG EC tablet Take 1 tablet (81 mg total) by mouth  daily. 30 tablet 3  . Calcium Carb-Cholecalciferol (CALCIUM PLUS VITAMIN D3) 600-800 MG-UNIT TABS Take 1 tablet by mouth daily.     Yvonne [name] levothyroxine (SYNTHROID, LEVOTHROID) 75 MCG tablet TAKE 1 TABLET (75 MCG TOTAL) BY MOUTH DAILY. 90 tablet 3  . lidocaine-prilocaine (EMLA) cream Apply up to 2 grams three times a day as needed for pain. 720 g 3  . Misc Natural Products (OSTEO BI-FLEX ADV TRIPLE ST) TABS Take 1 tablet by mouth daily.     . Multiple Vitamin (MULTIVITAMIN) tablet Take 1 tablet by mouth daily.      . naproxen sodium (ANAPROX) 220 MG tablet Take 200 mg by mouth once.    . Omega-3 Fatty Acids (FISH OIL) 1200 MG CAPS Take 1 capsule by mouth daily.      No current facility-administered medications on file prior to visit.     No Known Allergies  Past Medical History:  Diagnosis Date  . Allergy   . Hypertension   . Impaired functional mobility, balance, gait, and endurance 09/02/2015  . Mild cognitive impairment with memory loss 09/02/2015  . Stroke [facility])    pt was 40  . Thyroid disease     Past Surgical History:  Procedure Laterality  Date  . ABDOMINAL HYSTERECTOMY    . BRAIN SURGERY    . TOE AMPUTATION  2013   2nd toe on right foot, hammer toe    History  Smoking Status  . Never Smoker  Smokeless Tobacco  . Never Used    History  Alcohol Use No    Family History  Problem Relation Age of Onset  . Thyroid disease Mother   . Heart disease Father     Reviw of Systems:  Reviewed in the HPI.  All other systems are negative.  Physical Exam: Blood pressure 130/80, pulse 92, height 5\' 1"  (1.549 m), weight 145 lb 6.4 oz (66 kg). General: Well developed, well nourished, in no acute distress.  Head: Normocephalic, atraumatic, sclera non-icteric, mucus membranes are moist,   Neck: Supple.  JVD not elevated.  Lungs: Clear bilaterally to auscultation without wheezes, rales, or rhonchi. Breathing is unlabored.  Heart: RRR with S1 S2. There is a 2/6 systolic murmur  at the left sternal border.  Abdomen: Soft, non-tender, non-distended with normoactive bowel sounds. No hepatomegaly. No rebound/guarding. No obvious abdominal masses.  Msk:  Strength and tone appear normal for age.  Extremities: No clubbing or cyanosis. No edema.  Distal pedal pulses are 2+ and equal bilaterally.  Neuro: Alert and oriented X 3. Moves all extremities spontaneously.  Psych:  Responds to questions appropriately with a normal affect.  ECG: 02/04/17 NSR with inc. RBBB.    Assessment / Plan:   1. Mild AS / AI  - benign.  No symptoms .     2. Hypothyroidism - followed by family practice.   3. Hypertension - BP is fairly well controlled.  Advised her to avoid salt  4. Toe amputation for hammer toe   5. History of stroke- occluded left carotid, mild right carotid disease Will repeat carotid duplex scan.  I'll see her again in one year.    Mertie Moores, MD  02/04/2017 3:42 PM    Yvonne [name],  [place], [place]  [address] Pager [phone] Phone: [phone]; Fax: [phone]

## 2017-02-04 NOTE — Patient Instructions (Signed)
Medication Instructions:  Your physician recommends that you continue on your current medications as directed. Please refer to the Current Medication list given to you today.   Labwork: None Ordered   Testing/Procedures: Your physician has requested that you have a carotid duplex. This test is an ultrasound of the carotid arteries in your neck. It looks at blood flow through these arteries that supply the brain with blood. Allow one hour for this exam. There are no restrictions or special instructions.   Follow-Up: Your physician wants you to follow-up in: 1 year with Dr. Acie Fredrickson.  You will receive a reminder letter in the mail two months in advance. If you don't receive a letter, please call our office to schedule the follow-up appointment.   If you need a refill on your cardiac medications before your next appointment, please call your pharmacy.   Thank you for choosing [facility]! Christen Bame, RN [phone]

## 2017-02-05 ENCOUNTER — Ambulatory Visit (INDEPENDENT_AMBULATORY_CARE_PROVIDER_SITE_OTHER): Payer: Medicare HMO | Admitting: Orthotics

## 2017-02-05 DIAGNOSIS — M79672 Pain in left foot: Secondary | ICD-10-CM

## 2017-02-05 DIAGNOSIS — R2681 Unsteadiness on feet: Secondary | ICD-10-CM

## 2017-02-05 DIAGNOSIS — M79671 Pain in right foot: Secondary | ICD-10-CM

## 2017-02-05 NOTE — Progress Notes (Signed)
Patient came in today to pick up Yvonne [name] (Bilateral).   Patient was able to don [name] independently and [name] was check for fit to custom.   Patient was observed walking with [name] and gait was improved as well as stability.   Patient was advised of care and wearing instructions.  Advised to notify practice if there were any issues; especially skin irritation.  

## 2017-02-08 ENCOUNTER — Ambulatory Visit (INDEPENDENT_AMBULATORY_CARE_PROVIDER_SITE_OTHER): Payer: Medicare HMO

## 2017-02-08 ENCOUNTER — Ambulatory Visit (INDEPENDENT_AMBULATORY_CARE_PROVIDER_SITE_OTHER): Payer: Medicare HMO | Admitting: Physical Medicine and Rehabilitation

## 2017-02-08 ENCOUNTER — Encounter (INDEPENDENT_AMBULATORY_CARE_PROVIDER_SITE_OTHER): Payer: Self-pay | Admitting: Physical Medicine and Rehabilitation

## 2017-02-08 DIAGNOSIS — M25552 Pain in left hip: Secondary | ICD-10-CM | POA: Diagnosis not present

## 2017-02-08 NOTE — Progress Notes (Signed)
Yvonne [name] - 76 y.o. female MRN [id number]  Date of birth: March 27, 1941  Office Visit Note: Visit Date: 02/08/2017 PCP: Donnamae Jude, MD Referred by: Donnamae Jude, MD  Subjective: Chief Complaint  Patient presents with  . Left Hip - Pain   HPI: Yvonne [name] is a 76 year old female with left hip and groin pain for about a year. She reports a burning type of aching pain. She reports worsening symptoms with ambulating and getting in and out of car. She is felt like this is more related to her back. Dr. Ninfa Linden saw her and she does have significant osteoarthritis of the hip. She does have painful range of motion. She does not have any radicular-type complaints. She's not had a prior injection in the hip or really any lumbar spine injections. She's had no prior lumbar surgery.    ROS Otherwise per HPI.  Assessment & Plan: Visit Diagnoses:  1. Pain in left hip     Plan: Findings:  Diagnostic and therapeutic left hip anesthetic arthrogram. The patient did have relief during the anesthetic phase of the injection.    Meds & Orders: No orders of the defined types were placed in this encounter.   Orders Placed This Encounter  Procedures  . Large Joint Injection/Arthrocentesis  . XR C-ARM NO REPORT    Follow-up: Return for Dr. Ninfa Linden.   Procedures: Left hip anesthetic arthrogram Date/Time: 02/08/2017 9:54 AM Performed by: Magnus Sinning Authorized by: Magnus Sinning   Consent Given by:  Patient Site marked: the procedure site was marked   Timeout: prior to procedure the correct patient, procedure, and site was verified   Indications:  Pain and diagnostic evaluation Location:  Hip Site:  L hip joint Prep: patient was prepped and draped in usual sterile fashion   Needle Size:  22 G Approach:  Anterior Ultrasound Guidance: No   Fluoroscopic Guidance: No   Arthrogram: Yes   Medications:  3 mL bupivacaine 0.5 %; 80 mg triamcinolone acetonide 40 MG/ML Aspiration  Attempted: Yes   Patient tolerance:  Patient tolerated the procedure well with no immediate complications  Arthrogram demonstrated excellent flow of contrast throughout the joint surface without extravasation or obvious defect.  The patient had relief of symptoms during the anesthetic phase of the injection.      No notes on file   Clinical History: No specialty comments available.  She reports that she has never smoked. She has never used smokeless tobacco. No results for input(s): HGBA1C, LABURIC in the last 8760 hours.  Objective:  VS:  HT:    WT:   BMI:     BP:   HR: bpm  TEMP: ( )  RESP:  Physical Exam  Musculoskeletal:  Stiff painful range of motion of the left hip especially compared to the right. Good distal strength.    Ortho Exam Imaging: No results found.  Past Medical/Family/Surgical/Social History: Medications & Allergies reviewed per EMR Patient Active Problem List   Diagnosis Date Noted  . Bilateral carotid artery disease ([place]) 02/04/2017  . Chronic left-sided low back pain without sciatica 01/30/2017  . Pain of left hip joint 01/30/2017  . Unilateral primary osteoarthritis, left hip 01/30/2017  . Mild cognitive impairment with memory loss 09/02/2015  . Impaired functional mobility, balance, gait, and endurance 09/02/2015  . Hearing impairment 09/02/2015  . 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 Skin nodule 09/13/2014  . Osteopenia 08/30/2014  . Diffuse pain 04/07/2014  . Numbness in both legs 10/16/2012  .  Constipation 06/04/2012  . Hypercholesteremia 09/05/2011  . Osteoarthritis 04/12/2011  . 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 Hypothyroidism 07/12/2009  . ANXIETY 07/12/2009  . INSOMNIA, PERSISTENT 07/12/2009  . Essential hypertension 07/12/2009  . History of cerebrovascular accident 07/12/2009   Past Medical History:  Diagnosis Date  . Allergy   . Hypertension   . Impaired functional mobility, balance, gait, and endurance  09/02/2015  . Mild cognitive impairment with memory loss 09/02/2015  . Stroke [facility])    pt was 40  . Thyroid disease    Family History  Problem Relation Age of Onset  . Thyroid disease Mother   . Heart disease Father    Past Surgical History:  Procedure Laterality Date  . ABDOMINAL HYSTERECTOMY    . BRAIN SURGERY    . TOE AMPUTATION  2013   2nd toe on right foot, hammer toe   Social History   Occupational History  . Retired-teacher Retired   Social History Main Topics  . Smoking status: Never Smoker  . Smokeless tobacco: Never Used  . Alcohol use No  . Drug use: No  . Sexual activity: Not on file

## 2017-02-08 NOTE — Progress Notes (Deleted)
Left hip and groin pain for around 1 year. Gotten worse over time.Says its a burning type of pain.

## 2017-02-08 NOTE — Patient Instructions (Signed)

## 2017-02-11 MED ORDER — TRIAMCINOLONE ACETONIDE 40 MG/ML IJ SUSP
80.0000 mg | INTRAMUSCULAR | Status: AC | PRN
  Administered 2017-02-08: 80 mg via INTRA_ARTICULAR

## 2017-02-11 MED ORDER — BUPIVACAINE HCL 0.5 % IJ SOLN
3.0000 mL | INTRAMUSCULAR | Status: AC | PRN
  Administered 2017-02-08: 3 mL via INTRA_ARTICULAR

## 2017-02-14 ENCOUNTER — Encounter: Payer: Self-pay | Admitting: Family Medicine

## 2017-02-14 ENCOUNTER — Ambulatory Visit (INDEPENDENT_AMBULATORY_CARE_PROVIDER_SITE_OTHER): Payer: Medicare HMO | Admitting: Family Medicine

## 2017-02-14 VITALS — BP 126/70 | HR 79 | Temp 97.9°F | Ht 61.0 in | Wt 143.0 lb

## 2017-02-14 DIAGNOSIS — Z7189 Other specified counseling: Secondary | ICD-10-CM | POA: Diagnosis not present

## 2017-02-14 DIAGNOSIS — Z23 Encounter for immunization: Secondary | ICD-10-CM | POA: Diagnosis not present

## 2017-02-14 DIAGNOSIS — Z7409 Other reduced mobility: Secondary | ICD-10-CM | POA: Diagnosis not present

## 2017-02-14 DIAGNOSIS — I739 Peripheral vascular disease, unspecified: Secondary | ICD-10-CM

## 2017-02-14 DIAGNOSIS — M25552 Pain in left hip: Secondary | ICD-10-CM

## 2017-02-14 DIAGNOSIS — I10 Essential (primary) hypertension: Secondary | ICD-10-CM | POA: Diagnosis not present

## 2017-02-14 DIAGNOSIS — I779 Disorder of arteries and arterioles, unspecified: Secondary | ICD-10-CM

## 2017-02-14 NOTE — Patient Instructions (Signed)
DASH Eating Plan DASH stands for "Dietary Approaches to Stop Hypertension." The DASH eating plan is a healthy eating plan that has been shown to reduce high blood pressure (hypertension). It may also reduce your risk for type 2 diabetes, heart disease, and stroke. The DASH eating plan may also help with weight loss. What are tips for following this plan? General guidelines  Avoid eating more than 2,300 mg (milligrams) of salt (sodium) a day. If you have hypertension, you may need to reduce your sodium intake to 1,500 mg a day.  Limit alcohol intake to no more than 1 drink a day for nonpregnant women and 2 drinks a day for men. One drink equals 12 oz of beer, 5 oz of wine, or 1 oz of hard liquor.  Work with your health care provider to maintain a healthy body weight or to lose weight. Ask what an ideal weight is for you.  Get at least 30 minutes of exercise that causes your heart to beat faster (aerobic exercise) most days of the week. Activities may include walking, swimming, or biking.  Work with your health care provider or diet and nutrition specialist (dietitian) to adjust your eating plan to your individual calorie needs. Reading food labels  Check food labels for the amount of sodium per serving. Choose foods with less than 5 percent of the Daily Value of sodium. Generally, foods with less than 300 mg of sodium per serving fit into this eating plan.  To find whole grains, look for the word "whole" as the first word in the ingredient list. Shopping  Buy products labeled as "low-sodium" or "no salt added."  Buy fresh foods. Avoid canned foods and premade or frozen meals. Cooking  Avoid adding salt when cooking. Use salt-free seasonings or herbs instead of table salt or sea salt. Check with your health care provider or pharmacist before using salt substitutes.  Do not fry foods. Cook foods using healthy methods such as baking, boiling, grilling, and broiling instead.  Cook with  heart-healthy oils, such as olive, canola, soybean, or sunflower oil. Meal planning   Eat a balanced diet that includes: ? 5 or more servings of fruits and vegetables each day. At each meal, try to fill half of your plate with fruits and vegetables. ? Up to 6-8 servings of whole grains each day. ? Less than 6 oz of lean meat, poultry, or fish each day. A 3-oz serving of meat is about the same size as a deck of cards. One egg equals 1 oz. ? 2 servings of low-fat dairy each day. ? A serving of nuts, seeds, or beans 5 times each week. ? Heart-healthy fats. Healthy fats called Omega-3 fatty acids are found in foods such as flaxseeds and coldwater fish, like sardines, salmon, and mackerel.  Limit how much you eat of the following: ? Canned or prepackaged foods. ? Food that is high in trans fat, such as fried foods. ? Food that is high in saturated fat, such as fatty meat. ? Sweets, desserts, sugary drinks, and other foods with added sugar. ? Full-fat dairy products.  Do not salt foods before eating.  Try to eat at least 2 vegetarian meals each week.  Eat more home-cooked food and less restaurant, buffet, and fast food.  When eating at a restaurant, ask that your food be prepared with less salt or no salt, if possible. What foods are recommended? The items listed may not be a complete list. Talk with your dietitian about what   dietary choices are best for you. Grains Whole-grain or whole-wheat bread. Whole-grain or whole-wheat pasta. Brown rice. Oatmeal. Quinoa. Bulgur. Whole-grain and low-sodium cereals. Pita bread. Low-fat, low-sodium crackers. Whole-wheat flour tortillas. Vegetables Fresh or frozen vegetables (raw, steamed, roasted, or grilled). Low-sodium or reduced-sodium tomato and vegetable juice. Low-sodium or reduced-sodium tomato sauce and tomato paste. Low-sodium or reduced-sodium canned vegetables. Fruits All fresh, dried, or frozen fruit. Canned fruit in natural juice (without  added sugar). Meat and other protein foods Skinless chicken or turkey. Ground chicken or turkey. Pork with fat trimmed off. Fish and seafood. Egg whites. Dried beans, peas, or lentils. Unsalted nuts, nut butters, and seeds. Unsalted canned beans. Lean cuts of beef with fat trimmed off. Low-sodium, lean deli meat. Dairy Low-fat (1%) or fat-free (skim) milk. Fat-free, low-fat, or reduced-fat cheeses. Nonfat, low-sodium ricotta or cottage cheese. Low-fat or nonfat yogurt. Low-fat, low-sodium cheese. Fats and oils Soft margarine without trans fats. Vegetable oil. Low-fat, reduced-fat, or light mayonnaise and salad dressings (reduced-sodium). Canola, safflower, olive, soybean, and sunflower oils. Avocado. Seasoning and other foods Herbs. Spices. Seasoning mixes without salt. Unsalted popcorn and pretzels. Fat-free sweets. What foods are not recommended? The items listed may not be a complete list. Talk with your dietitian about what dietary choices are best for you. Grains Baked goods made with fat, such as croissants, muffins, or some breads. Dry pasta or rice meal packs. Vegetables Creamed or fried vegetables. Vegetables in a cheese sauce. Regular canned vegetables (not low-sodium or reduced-sodium). Regular canned tomato sauce and paste (not low-sodium or reduced-sodium). Regular tomato and vegetable juice (not low-sodium or reduced-sodium). Pickles. Olives. Fruits Canned fruit in a light or heavy syrup. Fried fruit. Fruit in cream or butter sauce. Meat and other protein foods Fatty cuts of meat. Ribs. Fried meat. Bacon. Sausage. Bologna and other processed lunch meats. Salami. Fatback. Hotdogs. Bratwurst. Salted nuts and seeds. Canned beans with added salt. Canned or smoked fish. Whole eggs or egg yolks. Chicken or turkey with skin. Dairy Whole or 2% milk, cream, and half-and-half. Whole or full-fat cream cheese. Whole-fat or sweetened yogurt. Full-fat cheese. Nondairy creamers. Whipped toppings.  Processed cheese and cheese spreads. Fats and oils Butter. Stick margarine. Lard. Shortening. Ghee. Bacon fat. Tropical oils, such as coconut, palm kernel, or palm oil. Seasoning and other foods Salted popcorn and pretzels. Onion salt, garlic salt, seasoned salt, table salt, and sea salt. Worcestershire sauce. Tartar sauce. Barbecue sauce. Teriyaki sauce. Soy sauce, including reduced-sodium. Steak sauce. Canned and packaged gravies. Fish sauce. Oyster sauce. Cocktail sauce. Horseradish that you find on the shelf. Ketchup. Mustard. Meat flavorings and tenderizers. Bouillon cubes. Hot sauce and Tabasco sauce. Premade or packaged marinades. Premade or packaged taco seasonings. Relishes. Regular salad dressings. Where to find more information:  National Heart, Lung, and Blood Institute: www.nhlbi.nih.gov  American Heart Association: www.heart.org Summary  The DASH eating plan is a healthy eating plan that has been shown to reduce high blood pressure (hypertension). It may also reduce your risk for type 2 diabetes, heart disease, and stroke.  With the DASH eating plan, you should limit salt (sodium) intake to 2,300 mg a day. If you have hypertension, you may need to reduce your sodium intake to 1,500 mg a day.  When on the DASH eating plan, aim to eat more fresh fruits and vegetables, whole grains, lean proteins, low-fat dairy, and heart-healthy fats.  Work with your health care provider or diet and nutrition specialist (dietitian) to adjust your eating plan to your individual   calorie needs. This information is not intended to replace advice given to you by your health care provider. Make sure you discuss any questions you have with your health care provider. Document Released: 05/24/2011 Document Revised: 05/28/2016 Document Reviewed: 05/28/2016 Elsevier Interactive Patient Education  2017 Elsevier Inc.  

## 2017-02-14 NOTE — Assessment & Plan Note (Signed)
Continue Norvasc and Lotensin HCTZ combo--BP at goal

## 2017-02-14 NOTE — Assessment & Plan Note (Signed)
For possible replacement

## 2017-02-14 NOTE — Assessment & Plan Note (Signed)
Continue braces as needed--use walker

## 2017-02-14 NOTE — Progress Notes (Signed)
   Subjective:    Patient ID: Yvonne [name] is a 76 y.o. female presenting with No chief complaint on file.  on 02/14/2017  HPI: S/p injection of her left hip and possible replacement. She would like to do her living will.  Review of Systems  Constitutional: Negative for chills and fever.  Respiratory: Negative for shortness of breath.   Cardiovascular: Negative for chest pain.  Gastrointestinal: Negative for abdominal pain, nausea and vomiting.  Genitourinary: Negative for dysuria.  Musculoskeletal: Positive for arthralgias (left hip and knee).  Skin: Negative for rash.      Objective:    BP 126/70   Pulse 79   Temp 97.9 F (36.6 C) (Oral)   Ht 5\' 1"  (1.549 m)   Wt 143 lb (64.9 kg)   SpO2 94%   BMI 27.02 kg/m  Physical Exam  Constitutional: She is oriented to person, place, and time. She appears well-developed and well-nourished. No distress.  HENT:  Head: Normocephalic and atraumatic.  Eyes: No scleral icterus.  Neck: Neck supple.  Cardiovascular: Normal rate.   Pulmonary/Chest: Effort normal.  Abdominal: Soft.  Neurological: She is alert and oriented to person, place, and time.  Skin: Skin is warm and dry.  Psychiatric: She has a normal mood and affect.        Assessment & Plan:   Problem List Items Addressed This Visit      Unprioritized   Essential hypertension - Primary    Continue Norvasc and Lotensin HCTZ combo--BP at goal      Impaired functional mobility, balance, gait, and endurance    Continue braces as needed--use walker      Pain of left hip joint    For possible replacement      Bilateral carotid artery disease ([place])    Has dopplers scheduled for tomorrow       Other Visit Diagnoses    Need for immunization against influenza       Relevant Orders   Flu Vaccine QUAD 36+ mos IM (Completed)   Living will, counseling/discussion       Information reviewed with patient. Written instructions given to patient. She will f/u with office  SW.      Return in about 3 months (around 05/17/2017).  Total face-to-face time with patient: 15 minutes. Over 50% of encounter was spent on counseling and coordination of care. Donnamae Jude 02/14/2017 2:11 PM

## 2017-02-14 NOTE — Assessment & Plan Note (Signed)
Has dopplers scheduled for tomorrow

## 2017-02-15 ENCOUNTER — Inpatient Hospital Stay (HOSPITAL_COMMUNITY)
Admission: RE | Admit: 2017-02-15 | Payer: Medicare HMO | Source: Ambulatory Visit | Attending: Cardiovascular Disease | Admitting: Cardiovascular Disease

## 2017-02-15 ENCOUNTER — Ambulatory Visit (HOSPITAL_COMMUNITY)
Admission: RE | Admit: 2017-02-15 | Discharge: 2017-02-15 | Disposition: A | Discharge status: Home / Self Care | Payer: Medicare HMO | Source: Ambulatory Visit | Attending: Internal Medicine | Admitting: Internal Medicine

## 2017-02-15 DIAGNOSIS — I739 Peripheral vascular disease, unspecified: Secondary | ICD-10-CM

## 2017-02-15 DIAGNOSIS — I779 Disorder of arteries and arterioles, unspecified: Secondary | ICD-10-CM | POA: Diagnosis not present

## 2017-02-15 DIAGNOSIS — I6523 Occlusion and stenosis of bilateral carotid arteries: Secondary | ICD-10-CM | POA: Diagnosis not present

## 2017-02-28 ENCOUNTER — Ambulatory Visit (INDEPENDENT_AMBULATORY_CARE_PROVIDER_SITE_OTHER): Payer: Medicare HMO | Admitting: Orthopaedic Surgery

## 2017-02-28 DIAGNOSIS — M1612 Unilateral primary osteoarthritis, left hip: Secondary | ICD-10-CM

## 2017-02-28 NOTE — Progress Notes (Signed)
The patient is well-known to me. She has severe debilitating arthritis of her left hip. She's been ambulatory with a walker for long period of time. X-ray evidence shows severe disease of the left hip with complete loss of joint space as well as para-articular osteophytes, sclerotic changes and severe joint space narrowing and a multiple areas. She has tried and failed all forms conservative treatment. She has recently had an intra-articular steroid injection that is helped only some. At this point her pain is daily and he can be 10 out of 10 at times. His detrimentally affects her activities daily living, her quality of life, her mobility. At this point she wishes to proceed with a total hip arthroplasty. We have gone over hip replacement in detail before her last visit and again today we went over this in detail given her handouts and shoulder hip model and going over the ins and outs of surgery as well as a thorough discussion of the risk and benefits of the surgery. She is someone who lives alone and would need skilled nursing postoperative as she recovers I explained this to her in detail.  On exam she has significant pain with any attempts of internal and external rotation of the left hip with a normal-appearing right hip. When she walks she walks with a significant Trendelenburg gait as well.  We'll work on scheduling her for surgery. All questions and concerns were asked and answered. We'll see her back in 2 weeks postoperative but no x-rays of be needed.

## 2017-03-06 ENCOUNTER — Other Ambulatory Visit (INDEPENDENT_AMBULATORY_CARE_PROVIDER_SITE_OTHER): Payer: Self-pay | Admitting: Orthopaedic Surgery

## 2017-03-06 DIAGNOSIS — M1612 Unilateral primary osteoarthritis, left hip: Secondary | ICD-10-CM

## 2017-03-08 ENCOUNTER — Encounter (HOSPITAL_COMMUNITY)
Admission: RE | Admit: 2017-03-08 | Discharge: 2017-03-08 | Disposition: A | Discharge status: Home / Self Care | Payer: Medicare HMO | Source: Ambulatory Visit | Attending: Orthopaedic Surgery | Admitting: Orthopaedic Surgery

## 2017-03-08 ENCOUNTER — Encounter (HOSPITAL_COMMUNITY): Payer: Self-pay

## 2017-03-08 DIAGNOSIS — Z79899 Other long term (current) drug therapy: Secondary | ICD-10-CM | POA: Insufficient documentation

## 2017-03-08 DIAGNOSIS — Z7982 Long term (current) use of aspirin: Secondary | ICD-10-CM | POA: Diagnosis not present

## 2017-03-08 DIAGNOSIS — I350 Nonrheumatic aortic (valve) stenosis: Secondary | ICD-10-CM | POA: Diagnosis not present

## 2017-03-08 DIAGNOSIS — E039 Hypothyroidism, unspecified: Secondary | ICD-10-CM | POA: Insufficient documentation

## 2017-03-08 DIAGNOSIS — I10 Essential (primary) hypertension: Secondary | ICD-10-CM | POA: Diagnosis not present

## 2017-03-08 DIAGNOSIS — Z01812 Encounter for preprocedural laboratory examination: Secondary | ICD-10-CM | POA: Insufficient documentation

## 2017-03-08 DIAGNOSIS — M1612 Unilateral primary osteoarthritis, left hip: Secondary | ICD-10-CM | POA: Insufficient documentation

## 2017-03-08 DIAGNOSIS — Z8673 Personal history of transient ischemic attack (TIA), and cerebral infarction without residual deficits: Secondary | ICD-10-CM | POA: Insufficient documentation

## 2017-03-08 HISTORY — DX: Nonrheumatic aortic (valve) stenosis: I35.0

## 2017-03-08 HISTORY — DX: Hypothyroidism, unspecified: E03.9

## 2017-03-08 HISTORY — DX: Unspecified osteoarthritis, unspecified site: M19.90

## 2017-03-08 HISTORY — DX: Cardiac murmur, unspecified: R01.1

## 2017-03-08 HISTORY — DX: Anxiety disorder, unspecified: F41.9

## 2017-03-08 LAB — BASIC METABOLIC PANEL
Anion gap: 9 (ref 5–15)
BUN: 13 mg/dL (ref 6–20)
CO2: 26 mmol/L (ref 22–32)
Calcium: 9.2 mg/dL (ref 8.9–10.3)
Chloride: 102 mmol/L (ref 101–111)
Creatinine, Ser: 0.61 mg/dL (ref 0.44–1.00)
GFR calc Af Amer: >60 mL/min (ref >60)
Glucose, Bld: 107 mg/dL — ABNORMAL HIGH (ref 65–99)
POTASSIUM: 3.9 mmol/L (ref 3.5–5.1)
Sodium: 137 mmol/L (ref 135–145)

## 2017-03-08 LAB — CBC
HEMATOCRIT: 41.5 % (ref 36.0–46.0)
HEMOGLOBIN: 13.6 g/dL (ref 12.0–15.0)
MCH: 29.6 pg (ref 26.0–34.0)
MCHC: 32.8 g/dL (ref 30.0–36.0)
MCV: 90.4 fL (ref 78.0–100.0)
Platelets: 382 10*3/uL (ref 150–400)
RBC: 4.59 MIL/uL (ref 3.87–5.11)
RDW: 13.7 % (ref 11.5–15.5)
WBC: 14.6 10*3/uL — ABNORMAL HIGH (ref 4.0–10.5)

## 2017-03-08 LAB — SURGICAL PCR SCREEN
MRSA, PCR: NEGATIVE
Staphylococcus aureus: POSITIVE — AB

## 2017-03-08 NOTE — Pre-Procedure Instructions (Addendum)
DONICA DEROUIN  03/08/2017      [facility] [address] [place] [address] Phone: [phone]   [facility] [place], [place] - [address] [place] [address] Phone: [phone] Fax: [facility] - [place], [place] [address] Phone: [phone] Fax: [phone], [facility] - [place], [place] [address] Phone: [phone] Fax: [phone]    Your procedure is scheduled on 03/19/17  Report to [facility] at 1030 A.M.  Call this number if you have problems the morning of surgery:  [phone]   Remember:  Do not eat food or drink liquids after midnight.  Take these medicines the morning of surgery with A SIP OF WATER   Amlodipine(norvasc) levothyroxine(synthroid)  STOP all herbel meds, nsaids (aleve,naproxen,advil,ibuprofen)  prior to surgery starting 03/12/17 including all vitamins/supplements,aspirin, goodys, fish oil   Do not wear jewelry, make-up or nail polish.  Do not wear lotions, powders, or perfumes, or deoderant.  Do not shave 48 hours prior to surgery.  Men may shave face and neck.  Do not bring valuables to the hospital.  [facility] is not responsible for any belongings or valuables.  Contacts, dentures or bridgework may not be worn into surgery.  Leave your suitcase in the car.  After surgery it may be brought to your room.  For patients admitted to the hospital, discharge time will be determined by your treatment team.  Patients discharged the day of surgery will not be allowed to drive home.   Special instructions: Special Instructions: [place] - Preparing for Surgery  Before surgery, you can play an important role.  Because skin is not sterile, your skin needs to be as free of germs as possible.  You can reduce the number  of germs on you skin by washing with CHG (chlorahexidine gluconate) soap before surgery.  CHG is an antiseptic cleaner which kills germs and bonds with the skin to continue killing germs even after washing.  Please DO NOT use if you have an allergy to CHG or antibacterial soaps.  If your skin becomes reddened/irritated stop using the CHG and inform your nurse when you arrive at Short Stay.  Do not shave (including legs and underarms) for at least 48 hours prior to the first CHG shower.  You may shave your face.  Please follow these instructions carefully:   1.  Shower with CHG Soap the night before surgery and the morning of Surgery.  2.  If you choose to wash your hair, wash your hair first as usual with your normal shampoo.  3.  After you shampoo, rinse your hair and body thoroughly to remove the Shampoo.  4.  Use CHG as you would any other liquid soap.  You can apply chg directly  to the skin and wash gently with scrungie or a clean washcloth.  5.  Apply the CHG Soap to your body ONLY FROM THE NECK DOWN.  Do not use on open wounds or open sores.  Avoid contact with your eyes ears, mouth and genitals (private parts).  Wash genitals (private parts)       with your normal soap.  6.  Wash thoroughly, paying special attention to the area where your surgery will be performed.  7.  Thoroughly rinse your body with warm water  from the neck down.  8.  DO NOT shower/wash with your normal soap after using and rinsing off the CHG Soap.  9.  Pat yourself dry with a clean towel.            10.  Wear clean pajamas.            11.  Place clean sheets on your bed the night of your first shower and do not sleep with pets.  Day of Surgery  Do not apply any lotions/deodorants the morning of surgery.  Please wear clean clothes to the hospital/surgery center.  Please read over the  fact sheets that you were given.

## 2017-03-08 NOTE — Progress Notes (Signed)
Surgical PCR +staph aureus.  Called Mupirocin in to [facility] [phone]). Left message on patient's voicemail to pick up prescription.

## 2017-03-11 ENCOUNTER — Encounter (HOSPITAL_COMMUNITY): Payer: Self-pay

## 2017-03-11 NOTE — Progress Notes (Signed)
Anesthesia Chart Review:  Pt is a 76 year old female scheduled for L total hip arthroplasty anterior approach on 03/19/2017 with Jean Rosenthal, MD  - PCP is Darron Doom, MD - Cardiologist is Mertie Moores, MD. Last office visit 02/04/17, 1 year f/u recommended.   PMH includes:  Stroke (age 11), HTN, aortic stenosis (mild by 2013 echo, Dr. Elmarie Shiley assessment 01/2017), hypothyroidism. Never smoker. BMI 26.5  Medications include: amlodipine, ASA 81mg , lipitor, benazepril-hctz, levothyroxine  BP (!) 121/57   Pulse 88   Temp 36.7 C   Resp 20   Ht 5\' 1"  (1.549 m)   Wt 140 lb (63.5 kg)   SpO2 95%   BMI 26.45 kg/m   Preoperative labs reviewed.  WBC 14.6.  Dr. Ninfa Linden has reviewed labs.   EKG 02/04/17: NSR. Nonspecific IVCD. Possible RVH.   Carotid duplex 02/15/17:  - Stable 1-39% RICA stenosis, with serpentine vessel. - Chronic LICA occlusion. - Patent vertebral arteries with antegrade flow. - Normal subclavian arteries, bilaterally.  Echo 08/15/11:  - Left ventricle: The cavity size was normal. Wall thickness was increased in a pattern of moderate LVH. Systolic function was normal. The estimated ejection fraction wasin the range of 60% to 65%. Wall motion was normal; therewere no regional wall motion abnormalities. Dopplerparameters are consistent with abnormal left ventricularrelaxation (grade 1 diastolic dysfunction). - Aortic valve: Valve mobility was restricted. There was mild stenosis. Mild regurgitation. Mean gradient: 41mm Hg(S). Peak gradient: 91mm Hg (S). - Mitral valve: Calcified annulus. - Left atrium: The atrium was mildly dilated. - Pericardium, extracardiac: A trivial pericardial effusionwas identified.  If no changes, I anticipate pt can proceed with surgery as scheduled.   Willeen Cass, FNP-BC [facility]/Anesthesiology Phone: [phone] 03/11/2017 1:02 PM

## 2017-03-18 MED ORDER — TRANEXAMIC ACID 1000 MG/10ML IV SOLN
1000.0000 mg | INTRAVENOUS | Status: AC
  Administered 2017-03-19: 1000 mg via INTRAVENOUS
  Filled 2017-03-18: qty 1100

## 2017-03-18 MED ORDER — CEFAZOLIN SODIUM-DEXTROSE 2-4 GM/100ML-% IV SOLN
2.0000 g | INTRAVENOUS | Status: AC
  Administered 2017-03-19: 2 g via INTRAVENOUS
  Filled 2017-03-18: qty 100

## 2017-03-19 ENCOUNTER — Encounter (HOSPITAL_COMMUNITY): Payer: Self-pay

## 2017-03-19 ENCOUNTER — Inpatient Hospital Stay (HOSPITAL_COMMUNITY): Payer: Medicare HMO

## 2017-03-19 ENCOUNTER — Encounter (HOSPITAL_COMMUNITY)
Admission: RE | Disposition: A | Discharge status: Skilled Nursing Facility | Payer: Self-pay | Source: Ambulatory Visit | Attending: Orthopaedic Surgery

## 2017-03-19 ENCOUNTER — Inpatient Hospital Stay (HOSPITAL_COMMUNITY): Payer: Medicare HMO | Admitting: Certified Registered Nurse Anesthetist

## 2017-03-19 ENCOUNTER — Inpatient Hospital Stay (HOSPITAL_COMMUNITY): Payer: Medicare HMO | Admitting: Emergency Medicine

## 2017-03-19 ENCOUNTER — Inpatient Hospital Stay (HOSPITAL_COMMUNITY)
Admission: RE | Admit: 2017-03-19 | Discharge: 2017-03-22 | DRG: 470 | Disposition: A | Discharge status: Skilled Nursing Facility | Payer: Medicare HMO | Source: Ambulatory Visit | Attending: Orthopaedic Surgery | Admitting: Orthopaedic Surgery

## 2017-03-19 DIAGNOSIS — G4709 Other insomnia: Secondary | ICD-10-CM | POA: Diagnosis present

## 2017-03-19 DIAGNOSIS — M25552 Pain in left hip: Secondary | ICD-10-CM | POA: Diagnosis present

## 2017-03-19 DIAGNOSIS — I060 Rheumatic aortic stenosis: Secondary | ICD-10-CM | POA: Diagnosis present

## 2017-03-19 DIAGNOSIS — Z8673 Personal history of transient ischemic attack (TIA), and cerebral infarction without residual deficits: Secondary | ICD-10-CM

## 2017-03-19 DIAGNOSIS — R488 Other symbolic dysfunctions: Secondary | ICD-10-CM | POA: Diagnosis not present

## 2017-03-19 DIAGNOSIS — J189 Pneumonia, unspecified organism: Secondary | ICD-10-CM | POA: Diagnosis not present

## 2017-03-19 DIAGNOSIS — H5461 Unqualified visual loss, right eye, normal vision left eye: Secondary | ICD-10-CM | POA: Diagnosis present

## 2017-03-19 DIAGNOSIS — Z96642 Presence of left artificial hip joint: Secondary | ICD-10-CM

## 2017-03-19 DIAGNOSIS — G3184 Mild cognitive impairment, so stated: Secondary | ICD-10-CM | POA: Diagnosis present

## 2017-03-19 DIAGNOSIS — S8002XA Contusion of left knee, initial encounter: Secondary | ICD-10-CM | POA: Diagnosis not present

## 2017-03-19 DIAGNOSIS — M6281 Muscle weakness (generalized): Secondary | ICD-10-CM | POA: Diagnosis not present

## 2017-03-19 DIAGNOSIS — I10 Essential (primary) hypertension: Secondary | ICD-10-CM | POA: Diagnosis present

## 2017-03-19 DIAGNOSIS — E039 Hypothyroidism, unspecified: Secondary | ICD-10-CM | POA: Diagnosis present

## 2017-03-19 DIAGNOSIS — M1612 Unilateral primary osteoarthritis, left hip: Principal | ICD-10-CM

## 2017-03-19 DIAGNOSIS — R2689 Other abnormalities of gait and mobility: Secondary | ICD-10-CM | POA: Diagnosis not present

## 2017-03-19 DIAGNOSIS — E7800 Pure hypercholesterolemia, unspecified: Secondary | ICD-10-CM | POA: Diagnosis present

## 2017-03-19 DIAGNOSIS — Z7982 Long term (current) use of aspirin: Secondary | ICD-10-CM | POA: Diagnosis not present

## 2017-03-19 DIAGNOSIS — I350 Nonrheumatic aortic (valve) stenosis: Secondary | ICD-10-CM | POA: Diagnosis not present

## 2017-03-19 DIAGNOSIS — Z89421 Acquired absence of other right toe(s): Secondary | ICD-10-CM

## 2017-03-19 DIAGNOSIS — I739 Peripheral vascular disease, unspecified: Secondary | ICD-10-CM | POA: Diagnosis not present

## 2017-03-19 DIAGNOSIS — F419 Anxiety disorder, unspecified: Secondary | ICD-10-CM | POA: Diagnosis not present

## 2017-03-19 DIAGNOSIS — D62 Acute posthemorrhagic anemia: Secondary | ICD-10-CM | POA: Diagnosis not present

## 2017-03-19 DIAGNOSIS — Z79899 Other long term (current) drug therapy: Secondary | ICD-10-CM

## 2017-03-19 DIAGNOSIS — M1990 Unspecified osteoarthritis, unspecified site: Secondary | ICD-10-CM | POA: Diagnosis not present

## 2017-03-19 DIAGNOSIS — Z471 Aftercare following joint replacement surgery: Secondary | ICD-10-CM | POA: Diagnosis not present

## 2017-03-19 DIAGNOSIS — Z419 Encounter for procedure for purposes other than remedying health state, unspecified: Secondary | ICD-10-CM

## 2017-03-19 HISTORY — PX: TOTAL HIP ARTHROPLASTY: SHX124

## 2017-03-19 SURGERY — ARTHROPLASTY, HIP, TOTAL, ANTERIOR APPROACH
Anesthesia: General | Site: Hip | Laterality: Left

## 2017-03-19 MED ORDER — BENAZEPRIL-HYDROCHLOROTHIAZIDE 20-25 MG PO TABS: 1.0000 | ORAL_TABLET | Freq: Every day | ORAL | Status: DC

## 2017-03-19 MED ORDER — METOCLOPRAMIDE HCL 5 MG/ML IJ SOLN: 5.0000 mg | Freq: Three times a day (TID) | INTRAMUSCULAR | Status: DC | PRN

## 2017-03-19 MED ORDER — LACTATED RINGERS IV SOLN: INTRAVENOUS | Status: DC

## 2017-03-19 MED ORDER — DOCUSATE SODIUM 100 MG PO CAPS
100.0000 mg | ORAL_CAPSULE | Freq: Two times a day (BID) | ORAL | Status: DC
  Administered 2017-03-19 – 2017-03-22 (×6): 100 mg via ORAL
  Filled 2017-03-19 (×6): qty 1

## 2017-03-19 MED ORDER — OXYCODONE HCL 5 MG PO TABS
5.0000 mg | ORAL_TABLET | ORAL | Status: DC | PRN
  Administered 2017-03-19: 5 mg via ORAL
  Administered 2017-03-20 – 2017-03-21 (×3): 10 mg via ORAL
  Administered 2017-03-22: 5 mg via ORAL
  Administered 2017-03-22: 10 mg via ORAL
  Filled 2017-03-19 (×2): qty 1
  Filled 2017-03-19 (×4): qty 2

## 2017-03-19 MED ORDER — ZOLPIDEM TARTRATE 5 MG PO TABS: 5.0000 mg | ORAL_TABLET | Freq: Every evening | ORAL | Status: DC | PRN

## 2017-03-19 MED ORDER — ATORVASTATIN CALCIUM 10 MG PO TABS
10.0000 mg | ORAL_TABLET | Freq: Every day | ORAL | Status: DC
  Administered 2017-03-20 – 2017-03-22 (×3): 10 mg via ORAL
  Filled 2017-03-19 (×3): qty 1

## 2017-03-19 MED ORDER — AMLODIPINE BESYLATE 5 MG PO TABS
5.0000 mg | ORAL_TABLET | Freq: Every day | ORAL | Status: DC
  Administered 2017-03-20 – 2017-03-21 (×2): 5 mg via ORAL
  Filled 2017-03-19 (×2): qty 1

## 2017-03-19 MED ORDER — LEVOTHYROXINE SODIUM 75 MCG PO TABS
75.0000 ug | ORAL_TABLET | Freq: Every day | ORAL | Status: DC
Start: 2017-03-20 — End: 2017-03-22
  Administered 2017-03-20 – 2017-03-22 (×3): 75 ug via ORAL
  Filled 2017-03-19 (×3): qty 1

## 2017-03-19 MED ORDER — ALUM & MAG HYDROXIDE-SIMETH 200-200-20 MG/5ML PO SUSP: 30.0000 mL | ORAL | Status: DC | PRN

## 2017-03-19 MED ORDER — PROPOFOL 10 MG/ML IV BOLUS
INTRAVENOUS | Status: DC | PRN
  Administered 2017-03-19: 100 mg via INTRAVENOUS
  Administered 2017-03-19: 50 mg via INTRAVENOUS

## 2017-03-19 MED ORDER — ALBUMIN HUMAN 5 % IV SOLN
INTRAVENOUS | Status: DC | PRN
  Administered 2017-03-19: 14:00:00 via INTRAVENOUS

## 2017-03-19 MED ORDER — MIDAZOLAM HCL 5 MG/5ML IJ SOLN
INTRAMUSCULAR | Status: DC | PRN
  Administered 2017-03-19: 1 mg via INTRAVENOUS

## 2017-03-19 MED ORDER — ONDANSETRON HCL 4 MG PO TABS: 4.0000 mg | ORAL_TABLET | Freq: Four times a day (QID) | ORAL | Status: DC | PRN

## 2017-03-19 MED ORDER — FENTANYL CITRATE (PF) 100 MCG/2ML IJ SOLN
25.0000 ug | INTRAMUSCULAR | Status: DC | PRN
  Administered 2017-03-19: 25 ug via INTRAVENOUS
  Administered 2017-03-19: 50 ug via INTRAVENOUS

## 2017-03-19 MED ORDER — FENTANYL CITRATE (PF) 100 MCG/2ML IJ SOLN
INTRAMUSCULAR | Status: DC | PRN
  Administered 2017-03-19 (×2): 25 ug via INTRAVENOUS
  Administered 2017-03-19: 50 ug via INTRAVENOUS
  Administered 2017-03-19: 25 ug via INTRAVENOUS
  Administered 2017-03-19: 50 ug via INTRAVENOUS
  Administered 2017-03-19: 25 ug via INTRAVENOUS

## 2017-03-19 MED ORDER — BENAZEPRIL HCL 20 MG PO TABS
20.0000 mg | ORAL_TABLET | Freq: Every day | ORAL | Status: DC
  Administered 2017-03-20 – 2017-03-21 (×2): 20 mg via ORAL
  Filled 2017-03-19 (×2): qty 1

## 2017-03-19 MED ORDER — PHENYLEPHRINE HCL 10 MG/ML IJ SOLN
INTRAMUSCULAR | Status: DC | PRN
  Administered 2017-03-19 (×2): 80 ug via INTRAVENOUS

## 2017-03-19 MED ORDER — CEFAZOLIN SODIUM-DEXTROSE 1-4 GM/50ML-% IV SOLN
1.0000 g | Freq: Four times a day (QID) | INTRAVENOUS | Status: AC
  Administered 2017-03-19 – 2017-03-20 (×2): 1 g via INTRAVENOUS
  Filled 2017-03-19 (×2): qty 50

## 2017-03-19 MED ORDER — SODIUM CHLORIDE 0.9 % IV SOLN
INTRAVENOUS | Status: DC
  Administered 2017-03-19 – 2017-03-21 (×2): via INTRAVENOUS

## 2017-03-19 MED ORDER — METHOCARBAMOL 500 MG PO TABS
500.0000 mg | ORAL_TABLET | Freq: Four times a day (QID) | ORAL | Status: DC | PRN
  Administered 2017-03-19 – 2017-03-22 (×2): 500 mg via ORAL
  Filled 2017-03-19: qty 1

## 2017-03-19 MED ORDER — HYDROMORPHONE HCL 1 MG/ML IJ SOLN
1.0000 mg | INTRAMUSCULAR | Status: DC | PRN
  Administered 2017-03-20 – 2017-03-21 (×2): 1 mg via INTRAVENOUS
  Filled 2017-03-19 (×2): qty 1

## 2017-03-19 MED ORDER — FENTANYL CITRATE (PF) 100 MCG/2ML IJ SOLN
INTRAMUSCULAR | Status: AC
  Filled 2017-03-19: qty 2

## 2017-03-19 MED ORDER — FENTANYL CITRATE (PF) 250 MCG/5ML IJ SOLN
INTRAMUSCULAR | Status: AC
  Filled 2017-03-19: qty 5

## 2017-03-19 MED ORDER — PROMETHAZINE HCL 25 MG/ML IJ SOLN: 6.2500 mg | INTRAMUSCULAR | Status: DC | PRN

## 2017-03-19 MED ORDER — BISACODYL 10 MG RE SUPP: 10.0000 mg | Freq: Every day | RECTAL | Status: DC | PRN

## 2017-03-19 MED ORDER — MIDAZOLAM HCL 2 MG/2ML IJ SOLN
INTRAMUSCULAR | Status: AC
  Filled 2017-03-19: qty 2

## 2017-03-19 MED ORDER — PHENOL 1.4 % MT LIQD: 1.0000 | OROMUCOSAL | Status: DC | PRN

## 2017-03-19 MED ORDER — MEPERIDINE HCL 25 MG/ML IJ SOLN: 6.2500 mg | INTRAMUSCULAR | Status: DC | PRN

## 2017-03-19 MED ORDER — DIPHENHYDRAMINE HCL 12.5 MG/5ML PO ELIX: 12.5000 mg | ORAL_SOLUTION | ORAL | Status: DC | PRN

## 2017-03-19 MED ORDER — POLYETHYLENE GLYCOL 3350 17 G PO PACK
17.0000 g | PACK | Freq: Every day | ORAL | Status: DC | PRN
  Administered 2017-03-22: 17 g via ORAL
  Filled 2017-03-19: qty 1

## 2017-03-19 MED ORDER — AMITRIPTYLINE HCL 50 MG PO TABS
150.0000 mg | ORAL_TABLET | Freq: Every day | ORAL | Status: DC
  Administered 2017-03-19 – 2017-03-21 (×3): 150 mg via ORAL
  Filled 2017-03-19 (×3): qty 3

## 2017-03-19 MED ORDER — ACETAMINOPHEN 325 MG PO TABS
650.0000 mg | ORAL_TABLET | Freq: Four times a day (QID) | ORAL | Status: DC | PRN
  Administered 2017-03-20: 650 mg via ORAL
  Filled 2017-03-19: qty 2

## 2017-03-19 MED ORDER — OXYCODONE HCL 5 MG PO TABS
ORAL_TABLET | ORAL | Status: AC
  Filled 2017-03-19: qty 1

## 2017-03-19 MED ORDER — METOCLOPRAMIDE HCL 5 MG PO TABS: 5.0000 mg | ORAL_TABLET | Freq: Three times a day (TID) | ORAL | Status: DC | PRN

## 2017-03-19 MED ORDER — MENTHOL 3 MG MT LOZG: 1.0000 | LOZENGE | OROMUCOSAL | Status: DC | PRN

## 2017-03-19 MED ORDER — EPHEDRINE SULFATE 50 MG/ML IJ SOLN
INTRAMUSCULAR | Status: DC | PRN
  Administered 2017-03-19 (×3): 10 mg via INTRAVENOUS

## 2017-03-19 MED ORDER — ACETAMINOPHEN 650 MG RE SUPP: 650.0000 mg | Freq: Four times a day (QID) | RECTAL | Status: DC | PRN

## 2017-03-19 MED ORDER — HYDROCHLOROTHIAZIDE 25 MG PO TABS
25.0000 mg | ORAL_TABLET | Freq: Every day | ORAL | Status: DC
  Administered 2017-03-20 – 2017-03-21 (×2): 25 mg via ORAL
  Filled 2017-03-19 (×2): qty 1

## 2017-03-19 MED ORDER — LACTATED RINGERS IV SOLN
INTRAVENOUS | Status: DC
  Administered 2017-03-19: 11:00:00 via INTRAVENOUS

## 2017-03-19 MED ORDER — METHOCARBAMOL 500 MG PO TABS
ORAL_TABLET | ORAL | Status: AC
  Filled 2017-03-19: qty 1

## 2017-03-19 MED ORDER — OMEGA-3-ACID ETHYL ESTERS 1 G PO CAPS
1.0000 g | ORAL_CAPSULE | Freq: Two times a day (BID) | ORAL | Status: DC
  Administered 2017-03-19 – 2017-03-22 (×6): 1 g via ORAL
  Filled 2017-03-19 (×6): qty 1

## 2017-03-19 MED ORDER — ONDANSETRON HCL 4 MG/2ML IJ SOLN: 4.0000 mg | Freq: Four times a day (QID) | INTRAMUSCULAR | Status: DC | PRN

## 2017-03-19 MED ORDER — PROPOFOL 10 MG/ML IV BOLUS
INTRAVENOUS | Status: AC
  Filled 2017-03-19: qty 20

## 2017-03-19 MED ORDER — LIDOCAINE HCL (CARDIAC) 20 MG/ML IV SOLN
INTRAVENOUS | Status: DC | PRN
  Administered 2017-03-19: 50 mg via INTRAVENOUS

## 2017-03-19 MED ORDER — DEXTROSE 5 % IV SOLN
500.0000 mg | Freq: Four times a day (QID) | INTRAVENOUS | Status: DC | PRN
  Filled 2017-03-19: qty 5

## 2017-03-19 MED ORDER — ONDANSETRON HCL 4 MG/2ML IJ SOLN
INTRAMUSCULAR | Status: DC | PRN
  Administered 2017-03-19: 4 mg via INTRAVENOUS

## 2017-03-19 MED ORDER — ASPIRIN 81 MG PO CHEW
81.0000 mg | CHEWABLE_TABLET | Freq: Two times a day (BID) | ORAL | Status: DC
  Administered 2017-03-19 – 2017-03-22 (×6): 81 mg via ORAL
  Filled 2017-03-19 (×6): qty 1

## 2017-03-19 MED ORDER — ADULT MULTIVITAMIN W/MINERALS CH
1.0000 | ORAL_TABLET | Freq: Every day | ORAL | Status: DC
  Administered 2017-03-20 – 2017-03-22 (×3): 1 via ORAL
  Filled 2017-03-19 (×3): qty 1

## 2017-03-19 SURGICAL SUPPLY — 53 items
BENZOIN TINCTURE PRP APPL 2/3 (GAUZE/BANDAGES/DRESSINGS) ×3 IMPLANT
BLADE CLIPPER SURG (BLADE) IMPLANT
BLADE SAW SGTL 18X1.27X75 (BLADE) ×2 IMPLANT
BLADE SAW SGTL 18X1.27X75MM (BLADE) ×1
CAPT HIP TOTAL 2 ×3 IMPLANT
CELLS DAT CNTRL 66122 CELL SVR (MISCELLANEOUS) ×1 IMPLANT
CLOSURE STERI-STRIP 1/2X4 (GAUZE/BANDAGES/DRESSINGS) ×1
CLOSURE WOUND 1/2 X4 (GAUZE/BANDAGES/DRESSINGS) ×2
CLSR STERI-STRIP ANTIMIC 1/2X4 (GAUZE/BANDAGES/DRESSINGS) ×2 IMPLANT
COVER SURGICAL LIGHT HANDLE (MISCELLANEOUS) ×3 IMPLANT
DRAPE C-ARM 42X72 X-RAY (DRAPES) ×3 IMPLANT
DRAPE STERI IOBAN 125X83 (DRAPES) ×3 IMPLANT
DRAPE U-SHAPE 47X51 STRL (DRAPES) ×9 IMPLANT
DRSG AQUACEL AG ADV 3.5X10 (GAUZE/BANDAGES/DRESSINGS) ×3 IMPLANT
DURAPREP 26ML APPLICATOR (WOUND CARE) ×3 IMPLANT
ELECT BLADE 4.0 EZ CLEAN MEGAD (MISCELLANEOUS) ×3
ELECT BLADE 6.5 EXT (BLADE) IMPLANT
ELECT REM PT RETURN 9FT ADLT (ELECTROSURGICAL) ×3
ELECTRODE BLDE 4.0 EZ CLN MEGD (MISCELLANEOUS) ×1 IMPLANT
ELECTRODE REM PT RTRN 9FT ADLT (ELECTROSURGICAL) ×1 IMPLANT
FACESHIELD WRAPAROUND (MASK) ×6 IMPLANT
GLOVE BIOGEL PI IND STRL 8 (GLOVE) ×2 IMPLANT
GLOVE BIOGEL PI INDICATOR 8 (GLOVE) ×4
GLOVE ECLIPSE 8.0 STRL XLNG CF (GLOVE) ×3 IMPLANT
GLOVE ORTHO TXT STRL SZ7.5 (GLOVE) ×6 IMPLANT
GOWN STRL REUS W/ TWL LRG LVL3 (GOWN DISPOSABLE) ×2 IMPLANT
GOWN STRL REUS W/ TWL XL LVL3 (GOWN DISPOSABLE) ×2 IMPLANT
GOWN STRL REUS W/TWL LRG LVL3 (GOWN DISPOSABLE) ×4
GOWN STRL REUS W/TWL XL LVL3 (GOWN DISPOSABLE) ×4
HANDPIECE INTERPULSE COAX TIP (DISPOSABLE) ×2
KIT BASIN OR (CUSTOM PROCEDURE TRAY) ×3 IMPLANT
KIT ROOM TURNOVER OR (KITS) ×3 IMPLANT
MANIFOLD NEPTUNE II (INSTRUMENTS) ×3 IMPLANT
NS IRRIG 1000ML POUR BTL (IV SOLUTION) ×3 IMPLANT
PACK TOTAL JOINT (CUSTOM PROCEDURE TRAY) ×3 IMPLANT
PAD ARMBOARD 7.5X6 YLW CONV (MISCELLANEOUS) ×3 IMPLANT
RTRCTR WOUND ALEXIS 18CM MED (MISCELLANEOUS) ×3
SET HNDPC FAN SPRY TIP SCT (DISPOSABLE) ×1 IMPLANT
STAPLER VISISTAT 35W (STAPLE) IMPLANT
STRIP CLOSURE SKIN 1/2X4 (GAUZE/BANDAGES/DRESSINGS) ×4 IMPLANT
SUT ETHIBOND NAB CT1 #1 30IN (SUTURE) ×3 IMPLANT
SUT MNCRL AB 4-0 PS2 18 (SUTURE) IMPLANT
SUT VIC AB 0 CT1 27 (SUTURE) ×2
SUT VIC AB 0 CT1 27XBRD ANBCTR (SUTURE) ×1 IMPLANT
SUT VIC AB 1 CT1 27 (SUTURE) ×2
SUT VIC AB 1 CT1 27XBRD ANBCTR (SUTURE) ×1 IMPLANT
SUT VIC AB 2-0 CT1 27 (SUTURE) ×2
SUT VIC AB 2-0 CT1 TAPERPNT 27 (SUTURE) ×1 IMPLANT
TOWEL OR 17X24 6PK STRL BLUE (TOWEL DISPOSABLE) ×3 IMPLANT
TOWEL OR 17X26 10 PK STRL BLUE (TOWEL DISPOSABLE) ×3 IMPLANT
TRAY CATH 16FR W/PLASTIC CATH (SET/KITS/TRAYS/PACK) IMPLANT
TRAY FOLEY W/METER SILVER 16FR (SET/KITS/TRAYS/PACK) IMPLANT
WATER STERILE IRR 1000ML POUR (IV SOLUTION) ×6 IMPLANT

## 2017-03-19 NOTE — Brief Op Note (Signed)
03/19/2017  2:13 PM  PATIENT:  Yvonne [name]  76 y.o. female  PRE-OPERATIVE DIAGNOSIS:  osteoarthritis left hip  POST-OPERATIVE DIAGNOSIS:  osteoarthritis left hip  PROCEDURE:  Procedure(s): LEFT TOTAL HIP ARTHROPLASTY ANTERIOR APPROACH (Left)  SURGEON:  Surgeon(s) and Role:    Mcarthur Rossetti, MD - Primary  PHYSICIAN ASSISTANT: Benita Stabile, PA-c  ANESTHESIA:   general  EBL:  Total I/O In: -  Out: 550 [Urine:200; Blood:350]  COUNTS:  YES  DICTATION: .Other Dictation: Dictation Number [id number]  PLAN OF CARE: Admit to inpatient   PATIENT DISPOSITION:  PACU - hemodynamically stable.   Delay start of Pharmacological VTE agent (>24hrs) due to surgical blood loss or risk of bleeding: no

## 2017-03-19 NOTE — Transfer of Care (Signed)
Immediate Anesthesia Transfer of Care Note  Patient: Yvonne [name]  Procedure(s) Performed: LEFT TOTAL HIP ARTHROPLASTY ANTERIOR APPROACH (Left Hip)  Patient Location: PACU  Anesthesia Type:General  Level of Consciousness: awake and alert   Airway & Oxygen Therapy: Patient Spontanous Breathing and Patient connected to nasal cannula oxygen  Post-op Assessment: Report given to RN and Post -op Vital signs reviewed and stable  Post vital signs: Reviewed and stable  Last Vitals:  Vitals:   03/19/17 1044  BP: (!) 164/75  Pulse: 84  Resp: 18  Temp: 36.6 C  SpO2: 94%    Last Pain:  Vitals:   03/19/17 1044  TempSrc: Oral      Patients Stated Pain Goal: 4 (15/72/62 0355)  Complications: No apparent anesthesia complications

## 2017-03-19 NOTE — Progress Notes (Signed)
PT Cancellation Note  Patient Details Name: Yvonne [name] MRN: [id number] DOB: 1940/07/07   Cancelled Treatment:    Reason Eval/Treat Not Completed: Patient declined, no reason specified Pt stating she needed to sleep because she hadn't slept for a week. Refusing mobility at this time. Will check back as schedule allows.   Leighton Ruff, PT, DPT  Acute Rehabilitation Services  Pager: [phone]    Yvonne [name] 03/19/2017, 4:54 PM

## 2017-03-19 NOTE — Anesthesia Procedure Notes (Signed)
Procedure Name: LMA Insertion Date/Time: 03/19/2017 12:47 PM Performed by: Ollen Bowl Pre-anesthesia Checklist: Patient identified, Emergency Drugs available, Suction available, Patient being monitored and Timeout performed Patient Re-evaluated:Patient Re-evaluated prior to induction Oxygen Delivery Method: Circle system utilized Preoxygenation: Pre-oxygenation with 100% oxygen Induction Type: IV induction Ventilation: Mask ventilation without difficulty LMA: LMA inserted LMA Size: 4.0 Number of attempts: 1 Placement Confirmation: positive ETCO2 and breath sounds checked- equal and bilateral Dental Injury: Teeth and Oropharynx as per pre-operative assessment

## 2017-03-19 NOTE — H&P (Signed)
TOTAL HIP ADMISSION H&P  Patient is admitted for left total hip arthroplasty.  Subjective:  Chief Complaint: left hip pain  HPI: Yvonne [name], 76 y.o. female, has a history of pain and functional disability in the left hip(s) due to arthritis and patient has failed non-surgical conservative treatments for greater than 12 weeks to include NSAID's and/or analgesics, corticosteriod injections, use of assistive devices and activity modification.  Onset of symptoms was gradual starting 3 years ago with gradually worsening course since that time.The patient noted no past surgery on the left hip(s).  Patient currently rates pain in the left hip at 10 out of 10 with activity. Patient has night pain, worsening of pain with activity and weight bearing, pain that interfers with activities of daily living and pain with passive range of motion. Patient has evidence of subchondral cysts, subchondral sclerosis, periarticular osteophytes and joint space narrowing by imaging studies. This condition presents safety issues increasing the risk of falls.  There is no current active infection.  Patient Active Problem List   Diagnosis Date Noted  . Bilateral carotid artery disease ([place]) 02/04/2017  . Chronic left-sided low back pain without sciatica 01/30/2017  . Pain of left hip joint 01/30/2017  . Unilateral primary osteoarthritis, left hip 01/30/2017  . Mild cognitive impairment with memory loss 09/02/2015  . Impaired functional mobility, balance, gait, and endurance 09/02/2015  . Hearing impairment 09/02/2015  . 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 Skin nodule 09/13/2014  . Osteopenia 08/30/2014  . Diffuse pain 04/07/2014  . Numbness in both legs 10/16/2012  . Constipation 06/04/2012  . Hypercholesteremia 09/05/2011  . Osteoarthritis 04/12/2011  . 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 Hypothyroidism 07/12/2009  . ANXIETY 07/12/2009  . INSOMNIA, PERSISTENT 07/12/2009  . Essential  hypertension 07/12/2009  . History of cerebrovascular accident 07/12/2009   Past Medical History:  Diagnosis Date  . Allergy   . Anxiety   . Aortic stenosis    mild by 07/2011 echo   . Arthritis   . Heart murmur   . Hypertension   . Hypothyroidism   . Impaired functional mobility, balance, gait, and endurance 09/02/2015  . Mild cognitive impairment with memory loss 09/02/2015  . Stroke [facility])    pt was 40  . Thyroid disease     Past Surgical History:  Procedure Laterality Date  . ABDOMINAL HYSTERECTOMY    . BRAIN SURGERY     age 37's  . TOE AMPUTATION  2013   2nd toe on right foot, hammer toe  . TONSILLECTOMY      Prescriptions Prior to Admission  Medication Sig Dispense Refill Last Dose  . amitriptyline (ELAVIL) 50 MG tablet Take 3 tablets (150 mg total) by mouth at bedtime. 270 tablet 3 03/18/2017 at Unknown time  . amLODipine (NORVASC) 5 MG tablet Take 1 tablet (5 mg total) by mouth daily. 90 tablet 3 03/19/2017 at 0500  . aspirin 81 MG EC tablet Take 1 tablet (81 mg total) by mouth daily. (Patient taking differently: Take 81 mg by mouth at bedtime. ) 30 tablet 3 Past Week at Unknown time  . aspirin-acetaminophen-caffeine (EXCEDRIN MIGRAINE) 250-250-65 MG tablet Take 1 tablet by mouth every 6 (six) hours as needed for headache.   Past Week at Unknown time  . atorvastatin (LIPITOR) 10 MG tablet Take 10 mg by mouth daily.   03/18/2017 at Unknown time  . benazepril-hydrochlorthiazide (LOTENSIN HCT) 20-25 MG tablet Take 1 tablet by mouth daily.   03/18/2017 at  Unknown time  . levothyroxine (SYNTHROID, LEVOTHROID) 75 MCG tablet TAKE 1 TABLET (75 MCG TOTAL) BY MOUTH DAILY. (Patient taking differently: Take 75 mcg by mouth daily before breakfast. ) 90 tablet 3 03/19/2017 at 0500  . lidocaine-prilocaine (EMLA) cream Apply up to 2 grams three times a day as needed for pain. (Patient taking differently: Apply 1 application topically 3 (three) times daily as needed (for pain). ) 720 g 3 Past Week  at Unknown time  . Multiple Vitamin (MULTIVITAMIN WITH MINERALS) TABS tablet Take 1 tablet by mouth daily with breakfast.   Past Week at Unknown time  . omega-3 acid ethyl esters (LOVAZA) 1 g capsule Take 1 g by mouth 2 (two) times daily.   Past Week at Unknown time   No Known Allergies  Social History  Substance Use Topics  . Smoking status: Never Smoker  . Smokeless tobacco: Never Used  . Alcohol use No    Family History  Problem Relation Age of Onset  . Thyroid disease Mother   . Heart disease Father      Review of Systems  Musculoskeletal: Positive for joint pain.  All other systems reviewed and are negative.   Objective:  Physical Exam  Constitutional: She is oriented to person, place, and time. She appears well-developed and well-nourished.  HENT:  Head: Normocephalic and atraumatic.  Eyes: Pupils are equal, round, and reactive to light. EOM are normal.  Neck: Normal range of motion. Neck supple.  Cardiovascular: Normal rate and regular rhythm.   Respiratory: Effort normal and breath sounds normal.  GI: Soft. Bowel sounds are normal.  Musculoskeletal:       Left hip: She exhibits decreased range of motion, decreased strength, tenderness and bony tenderness.  Neurological: She is alert and oriented to person, place, and time.  Skin: Skin is warm and dry.  Psychiatric: She has a normal mood and affect.    Vital signs in last 24 hours: Temp:  [97.9 F (36.6 C)] 97.9 F (36.6 C) (10/02 1044) Pulse Rate:  [84] 84 (10/02 1044) Resp:  [18] 18 (10/02 1044) BP: (164)/(75) 164/75 (10/02 1044) SpO2:  [94 %] 94 % (10/02 1044) Weight:  [140 lb (63.5 kg)] 140 lb (63.5 kg) (10/02 1044)  Labs:   Estimated body mass index is 26.45 kg/m as calculated from the following:   Height as of this encounter: 5\' 1"  (1.549 m).   Weight as of this encounter: 140 lb (63.5 kg).   Imaging Review Plain radiographs demonstrate severe degenerative joint disease of the left hip(s). The  bone quality appears to be good for age and reported activity level.  Assessment/Plan:  End stage arthritis, left hip(s)  The patient history, physical examination, clinical judgement of the provider and imaging studies are consistent with end stage degenerative joint disease of the left hip(s) and total hip arthroplasty is deemed medically necessary. The treatment options including medical management, injection therapy, arthroscopy and arthroplasty were discussed at length. The risks and benefits of total hip arthroplasty were presented and reviewed. The risks due to aseptic loosening, infection, stiffness, dislocation/subluxation,  thromboembolic complications and other imponderables were discussed.  The patient acknowledged the explanation, agreed to proceed with the plan and consent was signed. Patient is being admitted for inpatient treatment for surgery, pain control, PT, OT, prophylactic antibiotics, VTE prophylaxis, progressive ambulation and ADL's and discharge planning.The patient is planning to be discharged to skilled nursing facility

## 2017-03-19 NOTE — Anesthesia Postprocedure Evaluation (Signed)
Anesthesia Post Note  Patient: Yvonne [name]  Procedure(s) Performed: LEFT TOTAL HIP ARTHROPLASTY ANTERIOR APPROACH (Left Hip)     Patient location during evaluation: PACU Anesthesia Type: General Level of consciousness: awake and alert and oriented Pain management: pain level controlled Vital Signs Assessment: post-procedure vital signs reviewed and stable Respiratory status: spontaneous breathing, nonlabored ventilation, respiratory function stable and patient connected to nasal cannula oxygen Cardiovascular status: blood pressure returned to baseline and stable Postop Assessment: no apparent nausea or vomiting Anesthetic complications: no    Last Vitals:  Vitals:   03/19/17 1500 03/19/17 1515  BP: (!) 172/68 (!) 164/75  Pulse: 75 67  Resp: (!) 22 14  Temp:    SpO2: 97% 99%    Last Pain:  Vitals:   03/19/17 1500  TempSrc:   PainSc: 10-Worst pain ever                 Akili Cuda A.

## 2017-03-19 NOTE — Anesthesia Preprocedure Evaluation (Addendum)
Anesthesia Evaluation  Patient identified by MRN, date of birth, ID band Patient awake    Reviewed: Allergy & Precautions, NPO status , Patient's Chart, lab work & pertinent test results  Airway Mallampati: I  TM Distance: >3 FB Neck ROM: Full    Dental  (+) Edentulous Upper, Dental Advisory Given   Pulmonary neg pulmonary ROS,    breath sounds clear to auscultation       Cardiovascular hypertension, Pt. on medications and Pt. on home beta blockers + Peripheral Vascular Disease  + Valvular Problems/Murmurs  Rhythm:Regular Rate:Normal + Systolic murmurs    Neuro/Psych Anxiety CVA    GI/Hepatic negative GI ROS, Neg liver ROS,   Endo/Other  Hypothyroidism   Renal/GU negative Renal ROS     Musculoskeletal  (+) Arthritis ,   Abdominal   Peds  Hematology negative hematology ROS (+)   Anesthesia Other Findings Day of surgery medications reviewed with the patient.  Reproductive/Obstetrics                            Lab Results  Component Value Date   WBC 14.6 (H) 03/08/2017   HGB 13.6 03/08/2017   HCT 41.5 03/08/2017   MCV 90.4 03/08/2017   PLT 382 03/08/2017   No results found for: INR, PROTIME  EKG: normal sinus rhythm.   Anesthesia Physical Anesthesia Plan  ASA: II  Anesthesia Plan: General   Post-op Pain Management:    Induction: Intravenous  PONV Risk Score and Plan: 4 or greater and Ondansetron, Dexamethasone, Midazolam, Scopolamine patch - Pre-op and Treatment may vary due to age or medical condition  Airway Management Planned: LMA  Additional Equipment:   Intra-op Plan:   Post-operative Plan: Extubation in OR  Informed Consent: I have reviewed the patients History and Physical, chart, labs and discussed the procedure including the risks, benefits and alternatives for the proposed anesthesia with the patient or authorized representative who has indicated his/her  understanding and acceptance.   Dental advisory given  Plan Discussed with: CRNA  Anesthesia Plan Comments: (Will not perform spinal due to 3/4 systolic murmur suggestive of worsening aortic stenosis. Instructed family to follow up with with PCP or cardiologist. )      Anesthesia Quick Evaluation

## 2017-03-20 ENCOUNTER — Encounter (HOSPITAL_COMMUNITY): Payer: Self-pay | Admitting: Orthopaedic Surgery

## 2017-03-20 LAB — BASIC METABOLIC PANEL
Anion gap: 10 (ref 5–15)
BUN: 10 mg/dL (ref 6–20)
CHLORIDE: 102 mmol/L (ref 101–111)
CO2: 26 mmol/L (ref 22–32)
Calcium: 8 mg/dL — ABNORMAL LOW (ref 8.9–10.3)
Creatinine, Ser: 0.74 mg/dL (ref 0.44–1.00)
GFR calc Af Amer: >60 mL/min (ref >60)
GLUCOSE: 141 mg/dL — AB (ref 65–99)
POTASSIUM: 2.7 mmol/L — AB (ref 3.5–5.1)
SODIUM: 138 mmol/L (ref 135–145)

## 2017-03-20 LAB — CBC
HCT: 31.1 % — ABNORMAL LOW (ref 36.0–46.0)
HEMOGLOBIN: 10.5 g/dL — AB (ref 12.0–15.0)
MCH: 30.3 pg (ref 26.0–34.0)
MCHC: 33.8 g/dL (ref 30.0–36.0)
MCV: 89.6 fL (ref 78.0–100.0)
PLATELETS: 304 10*3/uL (ref 150–400)
RBC: 3.47 MIL/uL — AB (ref 3.87–5.11)
RDW: 13.7 % (ref 11.5–15.5)
WBC: 16.4 10*3/uL — AB (ref 4.0–10.5)

## 2017-03-20 LAB — POTASSIUM: POTASSIUM: 3.6 mmol/L (ref 3.5–5.1)

## 2017-03-20 MED ORDER — POTASSIUM CHLORIDE 10 MEQ/100ML IV SOLN
10.0000 meq | INTRAVENOUS | Status: AC
  Administered 2017-03-20 (×3): 10 meq via INTRAVENOUS
  Filled 2017-03-20 (×4): qty 100

## 2017-03-20 MED ORDER — POTASSIUM CHLORIDE CRYS ER 20 MEQ PO TBCR
40.0000 meq | EXTENDED_RELEASE_TABLET | Freq: Once | ORAL | Status: AC
Start: 2017-03-20 — End: 2017-03-20
  Administered 2017-03-20: 40 meq via ORAL
  Filled 2017-03-20: qty 2

## 2017-03-20 MED ORDER — POTASSIUM CHLORIDE 10 MEQ/100ML IV SOLN
10.0000 meq | INTRAVENOUS | Status: AC
  Administered 2017-03-20: 10 meq via INTRAVENOUS

## 2017-03-20 MED ORDER — POTASSIUM CHLORIDE 10 MEQ/100ML IV SOLN
10.0000 meq | Freq: Once | INTRAVENOUS | Status: AC
  Administered 2017-03-20: 10 meq via INTRAVENOUS
  Filled 2017-03-20: qty 100

## 2017-03-20 NOTE — Progress Notes (Signed)
Subjective: 1 Day Post-Op Procedure(s) (LRB): LEFT TOTAL HIP ARTHROPLASTY ANTERIOR APPROACH (Left) Patient reports pain as moderate.  Mild acute blood loss anemia from surgery, but tolerating well.  Objective: Vital signs in last 24 hours: Temp:  [97.3 F (36.3 C)-99.3 F (37.4 C)] 99.3 F (37.4 C) (10/03 0420) Pulse Rate:  [65-97] 97 (10/03 0420) Resp:  [14-22] 20 (10/03 0420) BP: (116-179)/(51-77) 116/53 (10/03 0420) SpO2:  [92 %-99 %] 92 % (10/03 0420) Weight:  [140 lb (63.5 kg)] 140 lb (63.5 kg) (10/02 1044)  Intake/Output from previous day: 10/02 0701 - 10/03 0700 In: 561.3 [P.O.:80; I.V.:231.3; IV Piggyback:250] Out: 750 [Urine:400; Blood:350] Intake/Output this shift: No intake/output data recorded.   Recent Labs  03/20/17 0508  HGB 10.5*    Recent Labs  03/20/17 0508  WBC 16.4*  RBC 3.47*  HCT 31.1*  PLT 304    Recent Labs  03/20/17 0508  NA 138  K 2.7*  CL 102  CO2 26  BUN 10  CREATININE 0.74  GLUCOSE 141*  CALCIUM 8.0*   No results for input(s): LABPT, INR in the last 72 hours.  Sensation intact distally Intact pulses distally Dorsiflexion/Plantar flexion intact Incision: dressing C/D/I  Assessment/Plan: 1 Day Post-Op Procedure(s) (LRB): LEFT TOTAL HIP ARTHROPLASTY ANTERIOR APPROACH (Left) Up with therapy  Mcarthur Rossetti 03/20/2017, 8:05 AM

## 2017-03-20 NOTE — Evaluation (Signed)
Occupational Therapy Evaluation Patient Details Name: Yvonne [name] MRN: [id number] DOB: 1941-06-16 Today's Date: 03/20/2017    History of Present Illness Patient is a 76 y/o female admitted for L direct anterior THA.  PMH positive for R eye blindness, h/o CVA, memory loss, HTN and hypothyroidism.   Clinical Impression   Pt admitted with above.  She demonstrates the below listed deficits.  She requires set up - mod A for ADLs and min A for functional transfers.  Pt is impulsive with decreased safety awareness.  She lives alone, and must be mod I to return home safely.  At this time, SNF level rehab is recommended.  All further OT needs can be met at SNF, acute OT will therefore sign off.     Follow Up Recommendations  SNF    Equipment Recommendations  None recommended by OT    Recommendations for Other Services       Precautions / Restrictions Precautions Precautions: Fall Restrictions Weight Bearing Restrictions: Yes LLE Weight Bearing: Weight bearing as tolerated      Mobility Bed Mobility Overal bed mobility: Needs Assistance Bed Mobility: Supine to Sit;Sit to Supine     Supine to sit: Mod assist Sit to supine: Min assist   General bed mobility comments: assist to move LEs off bed.  Pt impulsively fell back onto bed when transferring back to bed, landing sideways on the bed.  Assist to reposition   Transfers Overall transfer level: Needs assistance Equipment used: Rolling walker (2 wheeled) Transfers: Sit to/from [facility] Sit to Stand: Min assist Stand pivot transfers: Min assist       General transfer comment: min A to steady and for walker management     Balance Overall balance assessment: Needs assistance Sitting-balance support: Feet supported Sitting balance-Leahy Scale: Fair     Standing balance support: Bilateral upper extremity supported Standing balance-Leahy Scale: Poor Standing balance comment: UE support for balance                            ADL either performed or assessed with clinical judgement   ADL Overall ADL's : Needs assistance/impaired Eating/Feeding: Independent   Grooming: Wash/dry hands;Wash/dry face;Oral care;Brushing hair;Sitting;Set up;Supervision/safety   Upper Body Bathing: Set up;Supervision/ safety;Sitting   Lower Body Bathing: Moderate assistance;Sit to/from stand   Upper Body Dressing : Set up;Sitting   Lower Body Dressing: Maximal assistance;Sit to/from stand   Toilet Transfer: Minimal assistance;Stand-pivot;RW;[employer] Armed forces technical officer Details (indicate cue type and reason): assist for balance and walker managment  Toileting- Clothing Manipulation and Hygiene: Moderate assistance;Sit to/from stand       Functional mobility during ADLs: Minimal assistance;Rolling walker       Vision         Perception     Praxis      Pertinent Vitals/Pain Pain Assessment: Faces Faces Pain Scale: Hurts a little bit Pain Location: L hip  Pain Descriptors / Indicators: Operative site guarding Pain Intervention(s): Monitored during session;Repositioned     Hand Dominance     Extremity/Trunk Assessment Upper Extremity Assessment Upper Extremity Assessment: Generalized weakness   Lower Extremity Assessment Lower Extremity Assessment: Defer to PT evaluation       Communication Communication Communication: No difficulties;HOH   Cognition Arousal/Alertness: Awake/alert Behavior During Therapy: WFL for tasks assessed/performed Overall Cognitive Status: No family/caregiver present to determine baseline cognitive functioning  General Comments       Exercises     Shoulder Instructions      Home Living Family/patient expects to be discharged to:: Skilled nursing facility Living Arrangements: Alone Available Help at Discharge: Available PRN/intermittently;Neighbor Type of Home: House Home Access: Stairs to  enter Technical brewer of Steps: 5 Entrance Stairs-Rails: Left;Right;Can reach both Home Layout: One level               [place]: [place] - 4 wheels          Prior Functioning/Environment Level of Independence: Independent with assistive device(s)                 OT Problem List: Decreased strength;Decreased activity tolerance;Impaired balance (sitting and/or standing);Decreased safety awareness;Decreased knowledge of use of DME or AE;Pain      OT Treatment/Interventions:      OT Goals(Current goals can be found in the care plan section) Acute Rehab OT Goals Patient Stated Goal: to go to rehab to regain independence   OT Frequency:     Barriers to D/C: Decreased caregiver support          Co-evaluation              AM-PAC PT "6 Clicks" Daily Activity     Outcome Measure Help from another person eating meals?: None Help from another person taking care of personal grooming?: A Little Help from another person toileting, which includes using toliet, bedpan, or urinal?: A Lot Help from another person bathing (including washing, rinsing, drying)?: A Lot Help from another person to put on and taking off regular upper body clothing?: A Little Help from another person to put on and taking off regular lower body clothing?: A Lot 6 Click Score: 16   End of Session Equipment Utilized During Treatment: Rolling walker Nurse Communication: Mobility status  Activity Tolerance: Patient tolerated treatment well Patient left: in bed;with call bell/phone within reach;with bed alarm set;with family/visitor present  OT Visit Diagnosis: Unsteadiness on feet (R26.81);Pain Pain - Right/Left: Left Pain - part of body: Hip                Time: 4353-9122 OT Time Calculation (min): 22 min Charges:  OT General Charges $OT Visit: 1 Visit OT Evaluation $OT Eval Moderate Complexity: 1 Mod G-Codes:     [facility], OTR/L [phone]   Lucille Passy  M 03/20/2017, 6:50 PM

## 2017-03-20 NOTE — Progress Notes (Signed)
PT Cancellation Note  Patient Details Name: GREENLEY MARTONE MRN: [id number] DOB: [birth year]   Cancelled Treatment:    Reason Eval/Treat Not Completed: Fatigue/lethargy limiting ability to participate; patient in bed and declined ambulation this pm due to needing to sleep.  Discussed need for frequent walks, but continued to refuse stating, " I'll do it tomorrow."   Reginia Naas 03/20/2017, [place], [place] 03/20/2017

## 2017-03-20 NOTE — Progress Notes (Signed)
CRITICAL VALUE ALERT  Critical Value: potassium 2.7  Date & Time Notied: 03-20-17/ 6:40 am  Provider Notified: [employer] On Call  Orders Received/Actions taken:orders given / given 1 tab potassium 40 mEq x1; to be given 4 runs of potassium Iv by daydshift RN

## 2017-03-20 NOTE — Op Note (Signed)
NAMEELANNA, Yvonne [name]                ACCOUNT NO.:  [id number]  MEDICAL RECORD NO.:  [id number]  LOCATION:                                 FACILITY:  PHYSICIAN:  Lind Guest. Ninfa Linden, M.D.DATE OF BIRTH:  Dec 24, 1940  DATE OF PROCEDURE:  03/19/2017 DATE OF DISCHARGE:                              OPERATIVE REPORT   PREOPERATIVE DIAGNOSIS:  Severe osteoarthritis and degenerative joint disease, left hip.  POSTOPERATIVE DIAGNOSIS:  Severe osteoarthritis and degenerative joint disease, left hip.  PROCEDURE:  Left total hip arthroplasty through direct anterior approach.  IMPLANTS:  DePuy Sector Gription acetabular component size 52 with a single screw, size 36 +0 polyethylene liner, size 11 Corail femoral component with standard offset, size 36 +1.5 ceramic hip ball.  SURGEON:  Lind Guest. Ninfa Linden, M.D.  ASSISTANT:  Erskine Emery, PA-C.  ANESTHESIA:  General.  ANTIBIOTICS:  2 g of IV Ancef.  BLOOD LOSS:  350 mL.  COMPLICATIONS:  None.  INDICATIONS:  Yvonne [name] is a 76 year old female with debilitating arthritis involving her left hip.  She has tried and failed all forms of conservative treatment.  Her pain is daily and it is detrimentally affected her activities of daily living, her quality of life, and her mobility.  At this point, she does wish to proceed with total hip arthroplasty through direct anterior approach.  She fully understands the risks of acute blood loss anemia, nerve and vessel injury, fracture, infection, dislocation, and DVT.  She understands our goals are to decrease pain, improve mobility and overall improved quality of life.  PROCEDURE DESCRIPTION:  After informed consent was obtained, appropriate left hip was marked.  She was brought to the operating room and general anesthesia was obtained while she was on her stretcher.  A Foley catheter was placed, then both feet had traction boots applied to them. Next, she was placed supine on the Hana  fracture table with the perineal post in place and both legs in inline skeletal traction devices, but no traction applied.  Her left operative hip was prepped and draped with DuraPrep and sterile drapes.  A time-out was called and she was identified as correct patient and correct left hip.  We then made an incision just inferior and posterior to the anterior superior iliac spine and carried this obliquely down the leg.  We dissected down the tensor fascia lata muscle.  The tensor fascia was then divided longitudinally to proceed with a direct approach to the hip.  We identified and cauterized the circumflex vessels and then identified the hip capsule.  We opened up the hip capsule in an L-type format, finding a large joint effusion and significant arthritis in her left hip.  We then placed Cobra retractors on the medial and lateral femoral neck and made our femoral neck cut with an oscillating saw and completed this with an osteotome.  We removed the femoral head in its entirety and found it to be completely devoid of cartilage.  We then cleaned the acetabulum and remnants of the acetabular labrum and other debris.  We then began reaming from a size 42 reamers in stepwise increments up to a size 52 with all reamers under direct  visualization and the last two reamers under direct fluoroscopy, so we could obtain our depth of reaming, our inclination and anteversion.  Once we were pleased with this, we placed the real DePuy Sector Gription acetabular component size 52 and single screw.  We then placed a 36 +0 polyethylene liner for that size acetabular component.  Attention was then turned to the femur. With the leg externally rotated to 120 degrees, extended and adducted, we were able to place a Mueller retractor medially and a Hohmann retractor behind the greater trochanter.  We released the lateral joint capsule and used a box cutting osteotome to enter the femoral canal and a rongeur to  lateralize.  We then began broaching from a size 8 broach using the Corail broaching system going up to size 11.  With the size 11 in place, we trialed a standard offset femoral neck and a 36+ 1.5 hip ball.  We brought the leg back over and up with traction and internal rotation reducing in the pelvis, and we were pleased with leg length, offset, stability and range of motion.  We then dislocated the hip and removed the trial components.  We were able to place the real Corail femoral component with standard offset, size 11 and the real 36 +1.5 ceramic hip ball, reduced this in the acetabulum, and again, I was pleased with leg length, offset, stability and range of motion.  We then irrigated the soft tissue with normal saline solution using pulsatile lavage.  We closed the joint capsule with interrupted #1 Ethibond suture followed by running #1 Vicryl in the tensor fascia, 0 Vicryl in the deep tissue, 2-0 Vicryl in the subcutaneous tissue, 4-0 Monocryl in the subcuticular stitch and Steri-Strips on the skin.  An Aquacel dressing was applied.  She was taken off the Hana table, awakened, extubated and taken to the recovery room in stable condition.  All final counts were correct.  There were no complications noted.  Of note, Erskine Emery, PA- C assisted in the entire case.  His assistance was crucial facilitating all aspects of this case.     Lind Guest. Ninfa Linden, M.D.   ______________________________ Lind Guest. Ninfa Linden, M.D.    CYB/MEDQ  D:  03/19/2017  T:  03/20/2017  Job:  [id number]

## 2017-03-20 NOTE — Clinical Social Work Note (Signed)
Clinical Social Work Assessment  Patient Details  Name: Yvonne [name] MRN: [id number] Date of Birth: 01-27-1941  Date of referral:  03/20/17               Reason for consult:  Facility Placement                Permission sought to share information with:  Facility Art therapist granted to share information::  Yes, Verbal Permission Granted  Name::     she is responsible for self and declined to list friend or family  Agency::  SNF  Relationship::     Contact Information:     Housing/Transportation Living arrangements for the past 2 months:  Single Family Home Source of Information:  Patient Patient Interpreter Needed:  None Criminal Activity/Legal Involvement Pertinent to Current Situation/Hospitalization:  No - Comment as needed Significant Relationships:  Adult Children, Friend Lives with:  Self, Pets, Other (Comment) (three cats) Do you feel safe going back to the place where you live?  No Need for family participation in patient care:  No (Coment)  Care giving concerns:  Pt from home alone. Pt home is a one floor dwelling. Pt confirmed that prior to fall, she would ambulate with walker. Pt confirmed that she managed all ADL's independently prior to hospitalization. Pt resides with three cats. She indicates that she has neighbors, friends near by that provide support. She confirmed that friend is caring for cats while she is in hospital.  She declined for CSW to communicate with family member and indicated that she is responsible for self. Since patient resides alone, she is unable to return home at this time given her new impairment.  Social Worker assessment / plan:  CSW met with patient at bedside to discuss clinical teams recommendation for SNF placement. Pt in agreement with plan to got SNF at dc for short term rehab. CSW explained SNF process and options as patient has never gone to SNF in her past. CSW obtained permission to send to SNF's in the area. Pt  unsure where she would like to get short term rehab.  CSW discussed insurance and the need for insurance auth prior to discharge.  CSW will send out offers and continue with disposition.  Employment status:  Retired Nurse, adult PT Recommendations:  [place] / Referral to community resources:  [place]  Patient/Family's Response to care:  Patient appreciative of CSW following up for SNF placement. No issues or concerns identified at this time.  Patient/Family's Understanding of and Emotional Response to Diagnosis, Current Treatment, and Prognosis:  Patient has good understanding of diagnosis, current treatment, and prognosis. No issues or concerns identified.  Emotional Assessment Appearance:  Appears stated age Attitude/Demeanor/Rapport:   (Cooperative) Affect (typically observed):  Accepting, Appropriate Orientation:  Oriented to Self, Oriented to Place Alcohol / Substance use:  Not Applicable Psych involvement (Current and /or in the community):  No (Comment)  Discharge Needs  Concerns to be addressed:  Care Coordination Readmission within the last 30 days:  No Current discharge risk:  Physical Impairment, Dependent with Mobility Barriers to Discharge:  No Barriers Identified   Normajean Baxter, LCSW 03/20/2017, 3:22 PM

## 2017-03-20 NOTE — Evaluation (Signed)
Physical Therapy Evaluation Patient Details Name: Yvonne [name] MRN: [id number] DOB: [birth year] Today's Date: 03/20/2017   History of Present Illness  Patient is a 76 y/o female admitted for L direct anterior THA.  PMH positive for R eye blindness, h/o CVA, memory loss, HTN and hypothyroidism.  Clinical Impression  Patient presents with decreased mobility due to pain, limited safety awareness, decreased balance and general weakness.  She will benefit from skilled PT in the acute setting prior to d/c to SNF level rehab as she lives alone and has no consistent care available.      Follow Up Recommendations SNF;Supervision/Assistance - 24 hour    Equipment Recommendations  None recommended by PT    Recommendations for Other Services       Precautions / Restrictions Precautions Precautions: Fall Restrictions LLE Weight Bearing: Weight bearing as tolerated      Mobility  Bed Mobility Overal bed mobility: Needs Assistance Bed Mobility: Supine to Sit     Supine to sit: Mod assist;HOB elevated     General bed mobility comments: assist for bringing legs off bed and to lift trunk upright  Transfers Overall transfer level: Needs assistance Equipment used: Rolling walker (2 wheeled) Transfers: Sit to/from [facility] Sit to Stand: Mod assist Stand pivot transfers: Mod assist       General transfer comment: lifting help from bed, pivot to [facility] with RW and max cues for safety as pt wanting to reach for arm of chair prior to being close, then stood and pt trying to sit back on bed, cues for posture, and shuffling to step around to allow chair to brought benind prior to sit   Ambulation/Gait             General Gait Details: unable to to pain with L weight bearing  Stairs            Wheelchair Mobility    Modified Rankin (Stroke Patients Only)       Balance Overall balance assessment: Needs assistance   Sitting balance-Leahy Scale: Fair      Standing balance support: Bilateral upper extremity supported Standing balance-Leahy Scale: Poor Standing balance comment: UE support for balance                             Pertinent Vitals/Pain Pain Assessment: 0-10 Pain Score: 2  Pain Location: L hip  Pain Descriptors / Indicators: Discomfort;Operative site guarding Pain Intervention(s): Monitored during session    Home Living Family/patient expects to be discharged to:: Skilled nursing facility Living Arrangements: Alone Available Help at Discharge: Available PRN/intermittently;Neighbor Type of Home: House Home Access: Stairs to enter Entrance Stairs-Rails: Left;Right;Can reach both Technical brewer of Steps: 5 Home Layout: One level Home Equipment: Environmental consultant - 4 wheels      Prior Function Level of Independence: Independent with assistive device(s)         Comments: has groceries delivered and medications by mail, drives herself to doctor     Hand Dominance        Extremity/Trunk Assessment   Upper Extremity Assessment Upper Extremity Assessment: Defer to OT evaluation    Lower Extremity Assessment Lower Extremity Assessment: LLE deficits/detail LLE Deficits / Details: AAROM grossly limited by pain with supine to sit, bends up to 60 degrees supine       Communication   Communication: No difficulties;HOH  Cognition Arousal/Alertness: Awake/alert Behavior During Therapy: WFL for tasks assessed/performed Overall Cognitive Status: No  family/caregiver present to determine baseline cognitive functioning                                        General Comments      Exercises Total Joint Exercises Ankle Circles/Pumps: AROM;Both;10 reps;Supine Quad Sets: AAROM;Both;5 reps;Supine (with multiple attempts for correct technique, and max cues) Short Arc Quad: AROM;AAROM;Left;5 reps;Supine Heel Slides: AAROM;Left;5 reps;Supine Hip ABduction/ADduction: AAROM;AROM;Left;10  reps;Supine   Assessment/Plan    PT Assessment Patient needs continued PT services  PT Problem List Decreased range of motion;Decreased strength;Decreased mobility;Decreased safety awareness;Decreased balance;Decreased activity tolerance;Decreased knowledge of use of DME;Pain;Decreased knowledge of precautions       PT Treatment Interventions DME instruction;Gait training;Functional mobility training;Stair training;Balance training;Therapeutic exercise;Patient/family education;Therapeutic activities    PT Goals (Current goals can be found in the Care Plan section)  Acute Rehab PT Goals Patient Stated Goal: To go home, but considering rehab PT Goal Formulation: With patient Time For Goal Achievement: 03/27/17 Potential to Achieve Goals: Good    Frequency 7X/week   Barriers to discharge        Co-evaluation               AM-PAC PT "6 Clicks" Daily Activity  Outcome Measure Difficulty turning over in bed (including adjusting bedclothes, sheets and blankets)?: Unable Difficulty moving from lying on back to sitting on the side of the bed? : Unable Difficulty sitting down on and standing up from a chair with arms (e.g., wheelchair, bedside commode, etc,.)?: Unable Help needed moving to and from a bed to chair (including a wheelchair)?: A Lot Help needed walking in hospital room?: A Lot Help needed climbing 3-5 steps with a railing? : Total 6 Click Score: 8    End of Session Equipment Utilized During Treatment: Gait belt Activity Tolerance: Patient limited by pain Patient left: in chair;with call bell/phone within reach;with chair alarm set Nurse Communication: Mobility status PT Visit Diagnosis: Other abnormalities of gait and mobility (R26.89);Pain;Difficulty in walking, not elsewhere classified (R26.2) Pain - Right/Left: Left Pain - part of body: Hip    Time: 4765-4650 PT Time Calculation (min) (ACUTE ONLY): 34 min   Charges:   PT Evaluation $PT Eval Moderate  Complexity: 1 Mod PT Treatments $Therapeutic Activity: 8-22 mins   PT G CodesMagda Kiel, [place] [phone] 03/20/2017   Reginia Naas 03/20/2017, 1:32 PM

## 2017-03-20 NOTE — NC FL2 (Signed)
[place] LEVEL OF CARE SCREENING TOOL     IDENTIFICATION  Patient Name: Yvonne [name] Birthdate: 1940-11-24 Sex: female Admission Date (Current Location): 03/19/2017  [facility] and [place] Number:  Herbalist and Address:  [facility], [place] [address], [place], [place] [address]      Provider Number: [id number]  Attending Physician Name and Address:  Mcarthur Rossetti  Relative Name and Phone Number:   Doreene Eland, son, [phone]    Current Level of Care: Hospital Recommended Level of Care: [place] Prior Approval Number:    Date Approved/Denied: 03/20/17 PASRR Number: [id number]  Discharge Plan: SNF    Current Diagnoses: Patient Active Problem List   Diagnosis Date Noted  . Status post total replacement of left hip 03/19/2017  . Bilateral carotid artery disease ([place]) 02/04/2017  . Chronic left-sided low back pain without sciatica 01/30/2017  . Pain of left hip joint 01/30/2017  . Unilateral primary osteoarthritis, left hip 01/30/2017  . Mild cognitive impairment with memory loss 09/02/2015  . Impaired functional mobility, balance, gait, and endurance 09/02/2015  . Hearing impairment 09/02/2015  . 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 Skin nodule 09/13/2014  . Osteopenia 08/30/2014  . Diffuse pain 04/07/2014  . Numbness in both legs 10/16/2012  . Constipation 06/04/2012  . Hypercholesteremia 09/05/2011  . Osteoarthritis 04/12/2011  . 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 Hypothyroidism 07/12/2009  . ANXIETY 07/12/2009  . INSOMNIA, PERSISTENT 07/12/2009  . Essential hypertension 07/12/2009  . History of cerebrovascular accident 07/12/2009    Orientation RESPIRATION BLADDER Height & Weight     Self, Place  Normal Indwelling catheter Weight: 140 lb (63.5 kg) Height:  5\' 1"  (154.9 cm)  BEHAVIORAL SYMPTOMS/MOOD NEUROLOGICAL BOWEL NUTRITION STATUS      Continent Diet  (See DC Summary)  AMBULATORY STATUS COMMUNICATION OF NEEDS Skin   Limited Assist Verbally Surgical wounds                       Personal Care Assistance Level of Assistance  Feeding, Dressing, Bathing Bathing Assistance: Limited assistance Feeding assistance: Independent Dressing Assistance: Limited assistance     Functional Limitations Info  Sight (Blind in Right Eye)          SPECIAL CARE FACTORS FREQUENCY  PT (By licensed PT)     PT Frequency: 7x week              Contractures Contractures Info: Not present    Additional Factors Info  Code Status, Allergies Code Status Info: Full code Allergies Info: No Known Allergies           Current Medications (03/20/2017):  This is the current hospital active medication list Current Facility-Administered Medications  Medication Dose Route Frequency Provider Last Rate Last Dose  . 0.9 %  sodium chloride infusion   Intravenous Continuous Mcarthur Rossetti, MD 75 mL/hr at 03/19/17 1455    . acetaminophen (TYLENOL) tablet 650 mg  650 mg Oral Q6H PRN Mcarthur Rossetti, MD       Or  . acetaminophen (TYLENOL) suppository 650 mg  650 mg Rectal Q6H PRN Mcarthur Rossetti, MD      . alum & mag hydroxide-simeth (MAALOX/MYLANTA) 200-200-20 MG/5ML suspension 30 mL  30 mL Oral Q4H PRN Mcarthur Rossetti, MD      . amitriptyline (ELAVIL) tablet 150 mg  150 mg Oral QHS Mcarthur Rossetti, MD  150 mg at 03/19/17 2148  . amLODipine (NORVASC) tablet 5 mg  5 mg Oral Daily Mcarthur Rossetti, MD   5 mg at 03/20/17 0901  . aspirin chewable tablet 81 mg  81 mg Oral BID Mcarthur Rossetti, MD   81 mg at 03/20/17 0901  . atorvastatin (LIPITOR) tablet 10 mg  10 mg Oral Daily Mcarthur Rossetti, MD   10 mg at 03/20/17 0901  . benazepril (LOTENSIN) tablet 20 mg  20 mg Oral Daily Mcarthur Rossetti, MD   20 mg at 03/20/17 0901   And  . hydrochlorothiazide (HYDRODIURIL) tablet 25 mg  25 mg Oral  Daily Mcarthur Rossetti, MD   25 mg at 03/20/17 0901  . bisacodyl (DULCOLAX) suppository 10 mg  10 mg Rectal Daily PRN Mcarthur Rossetti, MD      . diphenhydrAMINE (BENADRYL) 12.5 MG/5ML elixir 12.5-25 mg  12.5-25 mg Oral Q4H PRN Mcarthur Rossetti, MD      . docusate sodium (COLACE) capsule 100 mg  100 mg Oral BID Mcarthur Rossetti, MD   100 mg at 03/20/17 0901  . HYDROmorphone (DILAUDID) injection 1 mg  1 mg Intravenous Q2H PRN Mcarthur Rossetti, MD   1 mg at 03/20/17 0035  . levothyroxine (SYNTHROID, LEVOTHROID) tablet 75 mcg  75 mcg Oral QAC breakfast Mcarthur Rossetti, MD   75 mcg at 03/20/17 0901  . menthol-cetylpyridinium (CEPACOL) lozenge 3 mg  1 lozenge Oral PRN Mcarthur Rossetti, MD       Or  . phenol (CHLORASEPTIC) mouth spray 1 spray  1 spray Mouth/Throat PRN Mcarthur Rossetti, MD      . methocarbamol (ROBAXIN) tablet 500 mg  500 mg Oral Q6H PRN Mcarthur Rossetti, MD   500 mg at 03/19/17 1450   Or  . methocarbamol (ROBAXIN) 500 mg in dextrose 5 % 50 mL IVPB  500 mg Intravenous Q6H PRN Mcarthur Rossetti, MD      . metoCLOPramide (REGLAN) tablet 5-10 mg  5-10 mg Oral Q8H PRN Mcarthur Rossetti, MD       Or  . metoCLOPramide (REGLAN) injection 5-10 mg  5-10 mg Intravenous Q8H PRN Mcarthur Rossetti, MD      . multivitamin with minerals tablet 1 tablet  1 tablet Oral Q breakfast Mcarthur Rossetti, MD   1 tablet at 03/20/17 0901  . omega-3 acid ethyl esters (LOVAZA) capsule 1 g  1 g Oral BID Mcarthur Rossetti, MD   1 g at 03/20/17 0902  . ondansetron (ZOFRAN) tablet 4 mg  4 mg Oral Q6H PRN Mcarthur Rossetti, MD       Or  . ondansetron [facility]) injection 4 mg  4 mg Intravenous Q6H PRN Mcarthur Rossetti, MD      . oxyCODONE (Oxy IR/ROXICODONE) immediate release tablet 5-10 mg  5-10 mg Oral Q3H PRN Mcarthur Rossetti, MD   10 mg at 03/20/17 1401  . polyethylene glycol (MIRALAX / GLYCOLAX) packet  17 g  17 g Oral Daily PRN Mcarthur Rossetti, MD      . potassium chloride 10 mEq in 100 mL IVPB  10 mEq Intravenous Once Mcarthur Rossetti, MD      . zolpidem Lorrin Mais) tablet 5 mg  5 mg Oral QHS PRN Mcarthur Rossetti, MD         Discharge Medications: Please see discharge summary for a list of discharge medications.  Relevant Imaging Results:  Relevant Lab Results:  Additional Information SS#: [place], LCSW

## 2017-03-20 NOTE — Op Note (Deleted)
  The note originally documented on this encounter has been moved the the encounter in which it belongs.  

## 2017-03-21 MED ORDER — ASPIRIN 81 MG PO CHEW: 81.0000 mg | CHEWABLE_TABLET | Freq: Two times a day (BID) | ORAL | 0 refills | Status: DC

## 2017-03-21 MED ORDER — METHOCARBAMOL 500 MG PO TABS: 500.0000 mg | ORAL_TABLET | Freq: Four times a day (QID) | ORAL | 0 refills | Status: DC | PRN

## 2017-03-21 MED ORDER — OXYCODONE-ACETAMINOPHEN 5-325 MG PO TABS: 1.0000 | ORAL_TABLET | ORAL | 0 refills | Status: DC | PRN

## 2017-03-21 NOTE — Discharge Instructions (Signed)

## 2017-03-21 NOTE — Social Work (Signed)
CSW met with patient this morning to discuss bed offers. Pt indicated that she would like to go to [facility].  CSW spoke with Tillie Rung at [facility] to confirm bed.  CSW advised patient that we will need to initiate [place].  CSW faxed clinicals to [facility] today.  CSW will continue to follow.  Elissa Hefty, LCSW Clinical Social Worker [phone]

## 2017-03-21 NOTE — Progress Notes (Signed)
Subjective: 2 Days Post-Op Procedure(s) (LRB): LEFT TOTAL HIP ARTHROPLASTY ANTERIOR APPROACH (Left) Patient reports pain as moderate.    Objective: Vital signs in last 24 hours: Temp:  [98.8 F (37.1 C)-99.1 F (37.3 C)] 98.8 F (37.1 C) (10/04 0540) Pulse Rate:  [81-98] 98 (10/04 0916) Resp:  [14-20] 14 (10/04 0916) BP: (130-137)/(41-59) 137/59 (10/04 0916) SpO2:  [94 %-96 %] 94 % (10/04 0916)  Intake/Output from previous day: 10/03 0701 - 10/04 0700 In: 2357.5 [P.O.:360; I.V.:1897.5; IV Piggyback:100] Out: -  Intake/Output this shift: No intake/output data recorded.   Recent Labs  03/20/17 0508  HGB 10.5*    Recent Labs  03/20/17 0508  WBC 16.4*  RBC 3.47*  HCT 31.1*  PLT 304    Recent Labs  03/20/17 0508 03/20/17 1608  NA 138  --   K 2.7* 3.6  CL 102  --   CO2 26  --   BUN 10  --   CREATININE 0.74  --   GLUCOSE 141*  --   CALCIUM 8.0*  --    No results for input(s): LABPT, INR in the last 72 hours.  Sensation intact distally Intact pulses distally Dorsiflexion/Plantar flexion intact Incision: dressing C/D/I  Assessment/Plan: 2 Days Post-Op Procedure(s) (LRB): LEFT TOTAL HIP ARTHROPLASTY ANTERIOR APPROACH (Left) Up with therapy Plan for discharge tomorrow Discharge home with home health  Mcarthur Rossetti 03/21/2017, 9:44 AM

## 2017-03-21 NOTE — Progress Notes (Signed)
Physical Therapy Treatment Patient Details Name: Yvonne [name] MRN: [id number] DOB: 02-Feb-1941 Today's Date: 03/21/2017    History of Present Illness Patient is a 76 y/o female admitted for L direct anterior THA.  PMH positive for R eye blindness, h/o CVA, memory loss, HTN and hypothyroidism.    PT Comments    Pt performed increased gait and functional mobility during session this pm.  PTA reviewed supine exercises and applied ice to L hip post tx.  Pt remains to benefit from skilled placement at SNF short term to improve strength and function before returning home.     Follow Up Recommendations  SNF;Supervision/Assistance - 24 hour     Equipment Recommendations  None recommended by PT    Recommendations for Other Services       Precautions / Restrictions Precautions Precautions: Fall Restrictions Weight Bearing Restrictions: Yes LLE Weight Bearing: Weight bearing as tolerated    Mobility  Bed Mobility Overal bed mobility: Needs Assistance Bed Mobility: Supine to Sit;Sit to Supine     Supine to sit: Mod assist     General bed mobility comments: Pt required assistance for LE advancement and trunk elevation.  Pt required cues for hand placement and foot placement to improve ease.    Transfers Overall transfer level: Needs assistance Equipment used: Rolling walker (2 wheeled) Transfers: Sit to/from Stand   Stand pivot transfers: Min assist       General transfer comment: min A to steady and for walker management   Ambulation/Gait Ambulation/Gait assistance: Min assist Ambulation Distance (Feet): 45 Feet Assistive device: Rolling walker (2 wheeled) Gait Pattern/deviations: Step-to pattern;Decreased stride length;Shuffle;Trunk flexed;Staggering right;Drifts right/left;Staggering left   Gait velocity interpretation: Below normal speed for age/gender General Gait Details: Cues for sequencing and RW position to improve safety.  Pt has a RW with four wheels and a  seat that is not a rollator, when the seat in removed the RW is very unstable and patient would benefit from using the RW in the room to improive safety.  Pt required cues for upper trunk control and RW safety.     Stairs            Wheelchair Mobility    Modified Rankin (Stroke Patients Only)       Balance Overall balance assessment: Needs assistance   Sitting balance-Leahy Scale: Fair       Standing balance-Leahy Scale: Poor Standing balance comment: UE support for balance                            Cognition Arousal/Alertness: Awake/alert Behavior During Therapy: WFL for tasks assessed/performed Overall Cognitive Status: No family/caregiver present to determine baseline cognitive functioning                                        Exercises Total Joint Exercises Ankle Circles/Pumps: AROM;Both;10 reps;Supine Quad Sets: AROM;Supine;Left Short Arc QuadSinclair Ship;Left;Supine;10 reps Heel Slides: AAROM;Left;Supine;10 reps Hip ABduction/ADduction: AAROM;Left;10 reps;Supine    General Comments        Pertinent Vitals/Pain Pain Assessment: 0-10 Faces Pain Scale: Hurts a little bit Pain Location: L hip  Pain Descriptors / Indicators: Operative site guarding Pain Intervention(s): Monitored during session;Repositioned    Home Living                      Prior Function  PT Goals (current goals can now be found in the care plan section) Acute Rehab PT Goals Patient Stated Goal: to go to rehab to regain independence  Potential to Achieve Goals: Good Progress towards PT goals: Progressing toward goals    Frequency    7X/week      PT Plan Current plan remains appropriate    Co-evaluation              AM-PAC PT "6 Clicks" Daily Activity  Outcome Measure  Difficulty turning over in bed (including adjusting bedclothes, sheets and blankets)?: Unable Difficulty moving from lying on back to sitting on the  side of the bed? : Unable Difficulty sitting down on and standing up from a chair with arms (e.g., wheelchair, bedside commode, etc,.)?: Unable Help needed moving to and from a bed to chair (including a wheelchair)?: A Little Help needed walking in hospital room?: A Little Help needed climbing 3-5 steps with a railing? : A Little 6 Click Score: 12    End of Session Equipment Utilized During Treatment: Gait belt Activity Tolerance: Patient limited by pain Patient left: in chair;with call bell/phone within reach;with chair alarm set Nurse Communication: Mobility status PT Visit Diagnosis: Other abnormalities of gait and mobility (R26.89);Pain;Difficulty in walking, not elsewhere classified (R26.2) Pain - Right/Left: Left Pain - part of body: Hip     Time: 0881-1031 PT Time Calculation (min) (ACUTE ONLY): 29 min  Charges:  $Gait Training: 8-22 mins $Therapeutic Exercise: 8-22 mins                    G Codes:       Governor Rooks, PTA pager [phone]    Cristela Blue 03/21/2017, 2:59 PM

## 2017-03-22 DIAGNOSIS — M1612 Unilateral primary osteoarthritis, left hip: Secondary | ICD-10-CM | POA: Diagnosis not present

## 2017-03-22 DIAGNOSIS — S8002XA Contusion of left knee, initial encounter: Secondary | ICD-10-CM | POA: Diagnosis not present

## 2017-03-22 DIAGNOSIS — K5901 Slow transit constipation: Secondary | ICD-10-CM | POA: Diagnosis not present

## 2017-03-22 DIAGNOSIS — I062 Rheumatic aortic stenosis with insufficiency: Secondary | ICD-10-CM | POA: Diagnosis not present

## 2017-03-22 DIAGNOSIS — J189 Pneumonia, unspecified organism: Secondary | ICD-10-CM | POA: Diagnosis not present

## 2017-03-22 DIAGNOSIS — G43009 Migraine without aura, not intractable, without status migrainosus: Secondary | ICD-10-CM | POA: Diagnosis not present

## 2017-03-22 DIAGNOSIS — E039 Hypothyroidism, unspecified: Secondary | ICD-10-CM | POA: Diagnosis not present

## 2017-03-22 DIAGNOSIS — R739 Hyperglycemia, unspecified: Secondary | ICD-10-CM | POA: Diagnosis not present

## 2017-03-22 DIAGNOSIS — I10 Essential (primary) hypertension: Secondary | ICD-10-CM | POA: Diagnosis not present

## 2017-03-22 DIAGNOSIS — D62 Acute posthemorrhagic anemia: Secondary | ICD-10-CM | POA: Diagnosis not present

## 2017-03-22 DIAGNOSIS — Z8673 Personal history of transient ischemic attack (TIA), and cerebral infarction without residual deficits: Secondary | ICD-10-CM | POA: Diagnosis not present

## 2017-03-22 DIAGNOSIS — M1990 Unspecified osteoarthritis, unspecified site: Secondary | ICD-10-CM | POA: Diagnosis not present

## 2017-03-22 DIAGNOSIS — M6281 Muscle weakness (generalized): Secondary | ICD-10-CM | POA: Diagnosis not present

## 2017-03-22 DIAGNOSIS — Z471 Aftercare following joint replacement surgery: Secondary | ICD-10-CM | POA: Diagnosis not present

## 2017-03-22 DIAGNOSIS — R2681 Unsteadiness on feet: Secondary | ICD-10-CM | POA: Diagnosis not present

## 2017-03-22 DIAGNOSIS — Z96642 Presence of left artificial hip joint: Secondary | ICD-10-CM | POA: Diagnosis not present

## 2017-03-22 DIAGNOSIS — R488 Other symbolic dysfunctions: Secondary | ICD-10-CM | POA: Diagnosis not present

## 2017-03-22 DIAGNOSIS — I350 Nonrheumatic aortic (valve) stenosis: Secondary | ICD-10-CM | POA: Diagnosis not present

## 2017-03-22 DIAGNOSIS — R2689 Other abnormalities of gait and mobility: Secondary | ICD-10-CM | POA: Diagnosis not present

## 2017-03-22 DIAGNOSIS — E038 Other specified hypothyroidism: Secondary | ICD-10-CM | POA: Diagnosis not present

## 2017-03-22 DIAGNOSIS — F339 Major depressive disorder, recurrent, unspecified: Secondary | ICD-10-CM | POA: Diagnosis not present

## 2017-03-22 DIAGNOSIS — D649 Anemia, unspecified: Secondary | ICD-10-CM | POA: Diagnosis not present

## 2017-03-22 NOTE — Social Work (Signed)
Insurance Auth received for placement in snf.  CSW sending DC summary to snf.  CSW will f/u.  Elissa Hefty, LCSW Clinical Social Worker [phone]

## 2017-03-22 NOTE — Progress Notes (Signed)
Physical Therapy Treatment Patient Details Name: Yvonne [name] MRN: [id number] DOB: 1940/12/28 Today's Date: 03/22/2017    History of Present Illness Patient is a 76 y/o female admitted for L direct anterior THA.  PMH positive for R eye blindness, h/o CVA, memory loss, HTN and hypothyroidism.    PT Comments    Pt performed increased gait and reviewed supine exercises.  Plan for d/c to SNF for short term rehab remains appropriate.  Pt is not safe to mobilize without assistance and continues to be a fall risk.     Follow Up Recommendations  SNF;Supervision/Assistance - 24 hour     Equipment Recommendations  None recommended by PT    Recommendations for Other Services       Precautions / Restrictions Precautions Precautions: Fall Restrictions Weight Bearing Restrictions: Yes LLE Weight Bearing: Weight bearing as tolerated    Mobility  Bed Mobility Overal bed mobility: Needs Assistance Bed Mobility: Supine to Sit;Sit to Supine     Supine to sit: Mod assist     General bed mobility comments: Pt required assistance for LE advancement and trunk elevation.  Pt required cues for hand placement and foot placement to improve ease.    Transfers Overall transfer level: Needs assistance Equipment used: Rolling walker (2 wheeled) Transfers: Sit to/from Stand Sit to Stand: Min assist Stand pivot transfers: Min assist       General transfer comment: min A to steady and for walker management   Ambulation/Gait Ambulation/Gait assistance: Min assist;Mod assist Ambulation Distance (Feet): 80 Feet Assistive device: Rolling walker (2 wheeled) Gait Pattern/deviations: Step-to pattern;Decreased stride length;Shuffle;Trunk flexed;Staggering right;Drifts right/left;Staggering left     General Gait Details: Cues for sequencing and RW position to improve safety.  Pt required cues for upper trunk control and RW safety.  Pt pushes RW too far forward.     Stairs             Wheelchair Mobility    Modified Rankin (Stroke Patients Only)       Balance Overall balance assessment: Needs assistance   Sitting balance-Leahy Scale: Fair       Standing balance-Leahy Scale: Poor Standing balance comment: UE support for balance                            Cognition Arousal/Alertness: Awake/alert Behavior During Therapy: WFL for tasks assessed/performed Overall Cognitive Status: No family/caregiver present to determine baseline cognitive functioning                                        Exercises Total Joint Exercises Ankle Circles/Pumps: AROM;Both;10 reps;Supine Quad Sets: AROM;Supine;Left;10 reps Short Arc QuadSinclair Ship;Left;Supine;10 reps Heel Slides: AAROM;Left;Supine;10 reps Hip ABduction/ADduction: AAROM;Left;10 reps;Supine Long Arc Quad: AROM;Left;10 reps;Supine    General Comments        Pertinent Vitals/Pain Pain Assessment: 0-10 Pain Score: 4  Pain Location: L hip  Pain Descriptors / Indicators: Operative site guarding Pain Intervention(s): Monitored during session;Repositioned    Home Living                      Prior Function            PT Goals (current goals can now be found in the care plan section) Acute Rehab PT Goals Patient Stated Goal: to go to rehab to regain independence  Potential to  Achieve Goals: Good Progress towards PT goals: Progressing toward goals    Frequency    7X/week      PT Plan Current plan remains appropriate    Co-evaluation              AM-PAC PT "6 Clicks" Daily Activity  Outcome Measure  Difficulty turning over in bed (including adjusting bedclothes, sheets and blankets)?: Unable Difficulty moving from lying on back to sitting on the side of the bed? : Unable Difficulty sitting down on and standing up from a chair with arms (e.g., wheelchair, bedside commode, etc,.)?: Unable Help needed moving to and from a bed to chair (including a  wheelchair)?: A Little Help needed walking in hospital room?: A Little Help needed climbing 3-5 steps with a railing? : A Little 6 Click Score: 12    End of Session Equipment Utilized During Treatment: Gait belt Activity Tolerance: Patient limited by pain Patient left: in chair;with call bell/phone within reach;with chair alarm set Nurse Communication: Mobility status PT Visit Diagnosis: Other abnormalities of gait and mobility (R26.89);Pain;Difficulty in walking, not elsewhere classified (R26.2) Pain - Right/Left: Left Pain - part of body: Hip     Time: 8177-1165 PT Time Calculation (min) (ACUTE ONLY): 16 min  Charges:  $Gait Training: 8-22 mins                    G Codes:       Governor Rooks, PTA pager [phone]    Cristela Blue 03/22/2017, 11:26 AM

## 2017-03-22 NOTE — Social Work (Signed)
Clinical Social Worker facilitated patient discharge including contacting patient family and facility to confirm patient discharge plans.  Clinical information faxed to facility and family agreeable with plan.    CSW arranged ambulance transport via [facility] to [facility].    RN to call [phone] to give report prior to discharge.  Clinical Social Worker will sign off for now as social work intervention is no longer needed. Please consult [place] again if new need arises.  Elissa Hefty, LCSW Clinical Social Worker [phone]

## 2017-03-22 NOTE — Progress Notes (Signed)
Patient ID: Yvonne [name], female   DOB: 26-Apr-1941, 76 y.o.   MRN: [id number] No acute changes.  Vitals stable.  Left hip stable.  Can be discharged today to skilled nursing.

## 2017-03-22 NOTE — Progress Notes (Signed)
Pt ready for d/c to SNF today per MD. Report called to Ivin Booty at [facility], all questions answered. Pt transferred to facility via [facility] with all belongings except for personal walker. Her son, [name], was notified that [facility] would be unable to transport to facility and that he must pick up.   [place], Jerry Caras

## 2017-03-22 NOTE — Care Management Important Message (Signed)
Important Message  Patient Details  Name: Yvonne [name] MRN: [id number] Date of Birth: October 06, 1940   Medicare Important Message Given:  Yes    Orbie Pyo 03/22/2017, 12:26 PM

## 2017-03-22 NOTE — Clinical Social Work Placement (Signed)
   CLINICAL SOCIAL WORK PLACEMENT  NOTE  Date:  03/22/2017  Patient Details  Name: Yvonne [name] MRN: [id number] Date of Birth: Feb 15, 1941  Clinical Social Work is seeking post-discharge placement for this patient at the [place] level of care (*CSW will initial, date and re-position this form in  chart as items are completed):  Yes   Patient/family provided with [facility] Work Department's list of facilities offering this level of care within the geographic area requested by the patient (or if unable, by the patient's family).  Yes   Patient/family informed of their freedom to choose among providers that offer the needed level of care, that participate in Medicare, Medicaid or managed care program needed by the patient, have an available bed and are willing to accept the patient.  Yes   Patient/family informed of [facility]'s ownership interest in [facility] and [facility], as well as of the fact that they are under no obligation to receive care at these facilities.  PASRR submitted to EDS on       PASRR number received on 03/20/17     Existing PASRR number confirmed on       FL2 transmitted to all facilities in geographic area requested by pt/family on 03/20/17     FL2 transmitted to all facilities within larger geographic area on       Patient informed that his/her managed care company has contracts with or will negotiate with certain facilities, including the following:        Yes   Patient/family informed of bed offers received.  Patient chooses bed at [facility] recommends and patient chooses bed at      Patient to be transferred to [facility] on 03/22/17.  Patient to be transferred to facility by [facility]     Patient family notified on 03/22/17 of transfer.  Name of family member notified:  patient responsbile for self     PHYSICIAN Please prepare prescriptions     Additional  Comment:    _______________________________________________ Normajean Baxter, LCSW 03/22/2017, 10:07 AM

## 2017-03-22 NOTE — Discharge Summary (Signed)
Patient ID: Yvonne [name] MRN: [id number] DOB/AGE: 08-07-1940 76 y.o.  Admit date: 03/19/2017 Discharge date: 03/22/2017  Admission Diagnoses:  Principal Problem:   Unilateral primary osteoarthritis, left hip Active Problems:   Status post total replacement of left hip   Discharge Diagnoses:  Same  Past Medical History:  Diagnosis Date  . Allergy   . Anxiety   . Aortic stenosis    mild by 07/2011 echo   . Arthritis   . Heart murmur   . Hypertension   . Hypothyroidism   . Impaired functional mobility, balance, gait, and endurance 09/02/2015  . Mild cognitive impairment with memory loss 09/02/2015  . Stroke [facility])    pt was 76  . Thyroid disease     Surgeries: Procedure(s): LEFT TOTAL HIP ARTHROPLASTY ANTERIOR APPROACH on 03/19/2017   Consultants:   Discharged Condition: Improved  Hospital Course: Yvonne [name] is an 76 y.o. female who was admitted 03/19/2017 for operative treatment ofUnilateral primary osteoarthritis, left hip. Patient has severe unremitting pain that affects sleep, daily activities, and work/hobbies. After pre-op clearance the patient was taken to the operating room on 03/19/2017 and underwent  Procedure(s): LEFT TOTAL HIP ARTHROPLASTY ANTERIOR APPROACH.    Patient was given perioperative antibiotics: Anti-infectives    Start     Dose/Rate Route Frequency Ordered Stop   03/19/17 1800  ceFAZolin (ANCEF) IVPB 1 g/50 mL premix     1 g 100 mL/hr over 30 Minutes Intravenous Every 6 hours 03/19/17 1608 03/20/17 0032   03/19/17 1200  ceFAZolin (ANCEF) IVPB 2g/100 mL premix     2 g 200 mL/hr over 30 Minutes Intravenous To [facility] 03/18/17 1327 03/19/17 1305       Patient was given sequential compression devices, early ambulation, and chemoprophylaxis to prevent DVT.  Patient benefited maximally from hospital stay and there were no complications.    Recent vital signs: Patient Vitals for the past 24 hrs:  BP Temp Temp src Pulse Resp SpO2   03/22/17 0500 104/62 98.7 F (37.1 C) Oral 83 17 98 %  03/21/17 2148 (!) 141/56 99.2 F (37.3 C) Oral 93 19 94 %  03/21/17 1954 (!) 174/84 99.1 F (37.3 C) Oral 84 19 95 %  03/21/17 1500 140/63 99.1 F (37.3 C) Oral 95 18 93 %  03/21/17 1358 (!) 128/52 98.6 F (37 C) Oral 96 20 90 %  03/21/17 0916 (!) 137/59 - - 98 14 94 %     Recent laboratory studies:  Recent Labs  03/20/17 0508 03/20/17 1608  WBC 16.4*  --   HGB 10.5*  --   HCT 31.1*  --   PLT 304  --   NA 138  --   K 2.7* 3.6  CL 102  --   CO2 26  --   BUN 10  --   CREATININE 0.74  --   GLUCOSE 141*  --   CALCIUM 8.0*  --      Discharge Medications:   Allergies as of 03/22/2017   No Known Allergies     Medication List    STOP taking these medications   aspirin 81 MG EC tablet Replaced by:  aspirin 81 MG chewable tablet     TAKE these medications   amitriptyline 50 MG tablet Commonly known as:  ELAVIL Take 3 tablets (150 mg total) by mouth at bedtime.   amLODipine 5 MG tablet Commonly known as:  NORVASC Take 1 tablet (5 mg total) by mouth daily.  aspirin 81 MG chewable tablet Chew 1 tablet (81 mg total) by mouth 2 (two) times daily. Replaces:  aspirin 81 MG EC tablet   aspirin-acetaminophen-caffeine 250-250-65 MG tablet Commonly known as:  EXCEDRIN MIGRAINE Take 1 tablet by mouth every 6 (six) hours as needed for headache.   atorvastatin 10 MG tablet Commonly known as:  LIPITOR Take 10 mg by mouth daily.   benazepril-hydrochlorthiazide 20-25 MG tablet Commonly known as:  LOTENSIN HCT Take 1 tablet by mouth daily.   levothyroxine 75 MCG tablet Commonly known as:  SYNTHROID, LEVOTHROID TAKE 1 TABLET (75 MCG TOTAL) BY MOUTH DAILY. What changed:  when to take this   lidocaine-prilocaine cream Commonly known as:  EMLA Apply up to 2 grams three times a day as needed for pain. What changed:  how much to take  how to take this  when to take this  reasons to take this  additional  instructions   methocarbamol 500 MG tablet Commonly known as:  ROBAXIN Take 1 tablet (500 mg total) by mouth every 6 (six) hours as needed for muscle spasms.   multivitamin with minerals Tabs tablet Take 1 tablet by mouth daily with breakfast.   omega-3 acid ethyl esters 1 g capsule Commonly known as:  LOVAZA Take 1 g by mouth 2 (two) times daily.   oxyCODONE-acetaminophen 5-325 MG tablet Commonly known as:  ROXICET Take 1-2 tablets by mouth every 4 (four) hours as needed.            Durable Medical Equipment        Start     Ordered   03/19/17 1608  DME 3 n 1  Once     03/19/17 1608   03/19/17 1608  DME Walker rolling  Once    Question:  Patient needs a walker to treat with the following condition  Answer:  Status post total replacement of left hip   03/19/17 1608      Diagnostic Studies: Dg Pelvis Portable  Result Date: 03/19/2017 CLINICAL DATA:  76 year old female undergoing left anterior hip replacement EXAM: DG C-ARM 61-120 MIN; PORTABLE PELVIS 1-2 VIEWS COMPARISON:  None. FINDINGS: Two intraoperative radiographs demonstrate surgical changes of left total hip arthroplasty without evidence of hardware complication. Expected subcutaneous emphysema. Mild osteitis pubis. Phleboliths project over the anatomic pelvis. 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 The visualized bowel gas pattern is unremarkable. IMPRESSION: Left total hip arthroplasty without evidence of immediate complication. Electronically Signed   By: Jacqulynn Cadet M.D.   On: 03/19/2017 15:10   Dg C-arm 61-120 Min  Result Date: 03/19/2017 CLINICAL DATA:  76 year old female undergoing left anterior hip replacement EXAM: DG C-ARM 61-120 MIN; PORTABLE PELVIS 1-2 VIEWS COMPARISON:  None. FINDINGS: Two intraoperative radiographs demonstrate surgical changes of left total hip arthroplasty without evidence of hardware complication. Expected subcutaneous emphysema. Mild osteitis pubis. Phleboliths project over  the anatomic pelvis. 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 The visualized bowel gas pattern is unremarkable. IMPRESSION: Left total hip arthroplasty without evidence of immediate complication. Electronically Signed   By: Jacqulynn Cadet M.D.   On: 03/19/2017 15:10   Dg Hip Operative Unilat W Or W/o Pelvis Left  Result Date: 03/19/2017 CLINICAL DATA:  Left total hip arthroplasty. EXAM: OPERATIVE LEFT HIP (WITH PELVIS IF PERFORMED) TECHNIQUE: Fluoroscopic spot image(s) were submitted for interpretation post-operatively. COMPARISON:  Left hip x-rays dated August 25, 2013. FINDINGS: Intraoperative x-rays demonstrate interval left total hip arthroplasty. Components are well aligned. No evidence of hardware complication. IMPRESSION: Left  total hip arthroplasty without evidence of hardware complication. FLUOROSCOPY TIME:  48 seconds. C-arm fluoroscopic images were obtained intraoperatively and submitted for post operative interpretation. Electronically Signed   By: Titus Dubin M.D.   On: 03/19/2017 14:27    Disposition: to skilled nursing facility  Discharge Instructions    Discharge patient    Complete by:  As directed    Discharge disposition:  03-Skilled [facility]   Discharge patient date:  03/22/2017       Contact information for follow-up providers    [facility] Follow up.   Specialty:  [place] Why:  A representative from [facility] will contact you to arrange start date and time for your therapy. Contact information: [address] [place] [address] [phone]        Mcarthur Rossetti, MD Follow up in 2 week(s).   Specialty:  Orthopedic Surgery Contact information: [place] [address] [phone]            Contact information for after-discharge care    Destination    [facility] .   Specialty:  [place] information: [address] [place] [phone]                   Signed: Mcarthur Rossetti 03/22/2017, 7:02 AM

## 2017-03-25 ENCOUNTER — Non-Acute Institutional Stay (SKILLED_NURSING_FACILITY): Payer: Medicare HMO | Admitting: Adult Health

## 2017-03-25 ENCOUNTER — Encounter: Payer: Self-pay | Admitting: Adult Health

## 2017-03-25 DIAGNOSIS — D62 Acute posthemorrhagic anemia: Secondary | ICD-10-CM | POA: Diagnosis not present

## 2017-03-25 DIAGNOSIS — Z8673 Personal history of transient ischemic attack (TIA), and cerebral infarction without residual deficits: Secondary | ICD-10-CM

## 2017-03-25 DIAGNOSIS — E038 Other specified hypothyroidism: Secondary | ICD-10-CM | POA: Diagnosis not present

## 2017-03-25 DIAGNOSIS — G43009 Migraine without aura, not intractable, without status migrainosus: Secondary | ICD-10-CM | POA: Diagnosis not present

## 2017-03-25 DIAGNOSIS — R2681 Unsteadiness on feet: Secondary | ICD-10-CM | POA: Diagnosis not present

## 2017-03-25 DIAGNOSIS — M1612 Unilateral primary osteoarthritis, left hip: Secondary | ICD-10-CM

## 2017-03-25 DIAGNOSIS — I10 Essential (primary) hypertension: Secondary | ICD-10-CM

## 2017-03-25 DIAGNOSIS — K5901 Slow transit constipation: Secondary | ICD-10-CM

## 2017-03-25 NOTE — Progress Notes (Signed)
DATE:  03/25/2017   MRN:  [id number]  BIRTHDAY: [birth year]  Facility:  Nursing Home Location:  [facility] [place] Room Number: 212-A  LEVEL OF CARE:  SNF (31)  Contact Information    Name Relation Home Work Mobile   [place] Son [phone]     No name specified           Code Status History    Date Active Date Inactive Code Status Order ID Comments User Context   03/19/2017  4:08 PM 03/22/2017  5:26 PM Full Code [id number]  Mcarthur Rossetti, MD Inpatient       Chief Complaint  Patient presents with  . Acute Visit    Hospital followup    HISTORY OF PRESENT ILLNESS:  This is a 76-YO female seen for an acute visit for hospital follow-up.  She was admitted to [place] on 03/22/17 for short-term rehabilitation, following an admission at 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  She has a PMH of bilateral carotid artery disease, mild cognitive impairment with memory loss, hearing impairment, osteopenia, aortic valve stenosis, and is blind in the right eye. She was  Seen in the room today. She complained of constipation.    PAST MEDICAL HISTORY:  Past Medical History:  Diagnosis Date  . Allergy   . Anxiety   . Aortic stenosis    mild by 07/2011 echo   . Arthritis   . Heart murmur   . Hypertension   . Hypothyroidism   . Impaired functional mobility, balance, gait, and endurance 09/02/2015  . Mild cognitive impairment with memory loss 09/02/2015  . Stroke [facility])    pt was 40  . Thyroid disease      CURRENT MEDICATIONS: Reviewed  Patient's Medications  New Prescriptions   No medications on file  Previous Medications   AMITRIPTYLINE (ELAVIL) 50 MG TABLET    Take 3 tablets (150 mg total) by mouth at bedtime.   AMLODIPINE (NORVASC) 5 MG TABLET    Take 1 tablet (5 mg total) by mouth daily.   ASPIRIN-ACETAMINOPHEN-CAFFEINE (EXCEDRIN MIGRAINE) 250-250-65 MG TABLET     Take 1 tablet by mouth every 6 (six) hours as needed for headache.   ATORVASTATIN (LIPITOR) 10 MG TABLET    Take 10 mg by mouth daily.   BENAZEPRIL-HYDROCHLORTHIAZIDE (LOTENSIN HCT) 20-25 MG TABLET    Take 1 tablet by mouth daily.    LEVOTHYROXINE (SYNTHROID, LEVOTHROID) 75 MCG TABLET    TAKE 1 TABLET (75 MCG TOTAL) BY MOUTH DAILY.   LIDOCAINE-PRILOCAINE (EMLA) CREAM    Apply up to 2 grams three times a day as needed for pain.   METHOCARBAMOL (ROBAXIN) 500 MG TABLET    Take 1 tablet (500 mg total) by mouth every 6 (six) hours as needed for muscle spasms.   MULTIPLE VITAMIN (MULTIVITAMIN WITH MINERALS) TABS TABLET    Take 1 tablet by mouth daily with breakfast.   OMEGA-3 ACID ETHYL ESTERS (LOVAZA) 1 G CAPSULE    Take 1 g by mouth 2 (two) times daily.   OXYCODONE-ACETAMINOPHEN (PERCOCET/ROXICET) 5-325 MG TABLET    Take 1 tablet by mouth every 4 (four) hours as needed for severe pain.  Modified Medications   No medications on file  Discontinued Medications   ASPIRIN 81 MG CHEWABLE TABLET    Chew 1 tablet (81 mg total) by mouth 2 (two) times daily.   OXYCODONE-ACETAMINOPHEN (ROXICET) 5-325 MG TABLET  Take 1-2 tablets by mouth every 4 (four) hours as needed.     No Known Allergies   REVIEW OF SYSTEMS:  GENERAL: no change in appetite, no fatigue, no weight changes, no fever, chills or weakness MOUTH and THROAT: Denies oral discomfort, gingival pain or bleeding  RESPIRATORY: no cough, SOB, DOE, wheezing, hemoptysis CARDIAC: no chest pain, edema or palpitations GI: no abdominal pain, diarrhea, heart burn, nausea or vomiting, +constipation GU: Denies dysuria, frequency, hematuria, incontinence, or discharge PSYCHIATRIC: Denies feeling of depression or anxiety. No report of hallucinations, insomnia, paranoia, or agitation    PHYSICAL EXAMINATION  GENERAL APPEARANCE: Well nourished. In no acute distress. Normal body habitus SKIN:  Left anterior hip surgical incision is covered with Aquacel  dressing, dry and no erythema EYES: Right eye is blind MOUTH and THROAT: Lips are without lesions. Oral mucosa is moist and without lesions. Tongue is normal in shape, size, and color and without lesions RESPIRATORY: breathing is even & unlabored, BS CTAB CARDIAC: RRR, + murmur,no extra heart sounds, no edema GI: abdomen soft, normal BS, no masses, no tenderness, no hepatomegaly, no splenomegaly EXTREMITIES:  Able to move X 4 extremities PSYCHIATRIC: Alert and oriented X 3. Affect and behavior are appropriate    LABS/RADIOLOGY: Labs reviewed: Basic Metabolic Panel:  Recent Labs  07/25/16 1155 03/08/17 1503 03/20/17 0508 03/20/17 1608  NA 142 137 138  --   K 3.8 3.9 2.7* 3.6  CL 103 102 102  --   CO2 28 26 26   --   GLUCOSE 109* 107* 141*  --   BUN 16 13 10   --   CREATININE 0.83 0.61 0.74  --   CALCIUM 9.5 9.2 8.0*  --    Liver Function Tests:  Recent Labs  07/25/16 1155  AST 16  ALT 12  ALKPHOS 71  BILITOT 0.4  PROT 6.8  ALBUMIN 4.0   CBC:  Recent Labs  07/25/16 1155 03/08/17 1503 03/20/17 0508  WBC 9.1 14.6* 16.4*  HGB 13.3 13.6 10.5*  HCT 40.3 41.5 31.1*  MCV 90.6 90.4 89.6  PLT 370 382 304   Lipid Panel:  Recent Labs  07/25/16 1155  HDL 52    Dg Pelvis Portable  Result Date: 03/19/2017 CLINICAL DATA:  76 year old female undergoing left anterior hip replacement EXAM: DG C-ARM 61-120 MIN; PORTABLE PELVIS 1-2 VIEWS COMPARISON:  None. FINDINGS: Two intraoperative radiographs demonstrate surgical changes of left total hip arthroplasty without evidence of hardware complication. Expected subcutaneous emphysema. Mild osteitis pubis. Phleboliths project over the anatomic pelvis. 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 The visualized bowel gas pattern is unremarkable. IMPRESSION: Left total hip arthroplasty without evidence of immediate complication. Electronically Signed   By: Jacqulynn Cadet M.D.   On: 03/19/2017 15:10   Dg C-arm 61-120 Min  Result  Date: 03/19/2017 CLINICAL DATA:  76 year old female undergoing left anterior hip replacement EXAM: DG C-ARM 61-120 MIN; PORTABLE PELVIS 1-2 VIEWS COMPARISON:  None. FINDINGS: Two intraoperative radiographs demonstrate surgical changes of left total hip arthroplasty without evidence of hardware complication. Expected subcutaneous emphysema. Mild osteitis pubis. Phleboliths project over the anatomic pelvis. 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 The visualized bowel gas pattern is unremarkable. IMPRESSION: Left total hip arthroplasty without evidence of immediate complication. Electronically Signed   By: Jacqulynn Cadet M.D.   On: 03/19/2017 15:10   Dg Hip Operative Unilat W Or W/o Pelvis Left  Result Date: 03/19/2017 CLINICAL DATA:  Left total hip arthroplasty. EXAM: OPERATIVE LEFT HIP (WITH PELVIS IF  PERFORMED) TECHNIQUE: Fluoroscopic spot image(s) were submitted for interpretation post-operatively. COMPARISON:  Left hip x-rays dated August 25, 2013. FINDINGS: Intraoperative x-rays demonstrate interval left total hip arthroplasty. Components are well aligned. No evidence of hardware complication. IMPRESSION: Left total hip arthroplasty without evidence of hardware complication. FLUOROSCOPY TIME:  48 seconds. C-arm fluoroscopic images were obtained intraoperatively and submitted for post operative interpretation. Electronically Signed   By: Titus Dubin M.D.   On: 03/19/2017 14:27    ASSESSMENT/PLAN:  1. Unsteady gait - for rehabilitation, PT and OT, for therapeutic strengthening exercises; fall precautions   2. Unilateral primary osteoarthritis, left hip - S/P left anterior arthroplasty, follow-up with Dr. Jean Rosenthal, orthopedics, in 2 weeks, LLE WBAT, decrease Percocet 5-325 mg 1 tab from every 4 hours to every 6 hours when necessary for pain, Robaxin 500 mg 1 tab by mouth every 6 hours when necessary for muscle spasm, aspirin 81 mg chewable tablet 1 tab by mouth twice a day for DVT  prophylaxis   3. Essential hypertension - well controlled, continue amlodipine 5 mg 1 tab daily and Lotensin HCT 20-25 mg 1 tab daily, check BMP   4. History of stroke - stable, continue amlodipine 5 mg 1 tab daily and Lotensin HCT 20-25 mg 1 tab daily and Lipitor 10 mg 1 tab daily, aspirin 81 mg chewable tablet 1 tab by mouth twice a day   5. Migraine without aura and without status migrainosus, not intractable - stable, continue amitriptyline 50 mg daily 3 tabs = 150 mg daily and Axid 3 in migraine 250-250-65 mg 1 tab every 6 hours when necessary   6. Other specified hypothyroidism - continue Synthroid 75 g 1 tab daily Lab Results  Component Value Date   TSH 1.82 07/25/2016    7. Constipation - start senna S 8.6-50 mg 2 tabs by mouth twice a day   8. Anemia, acute blood loss -  Check CBC Lab Results  Component Value Date   HGB 10.5 (L) 03/20/2017        Goals of care:  Short-term rehabilitation    Philicia Heyne C. [place] - NP    [employer] [phone]

## 2017-03-28 ENCOUNTER — Non-Acute Institutional Stay (SKILLED_NURSING_FACILITY): Payer: Medicare HMO | Admitting: Internal Medicine

## 2017-03-28 ENCOUNTER — Encounter: Payer: Self-pay | Admitting: Internal Medicine

## 2017-03-28 DIAGNOSIS — D649 Anemia, unspecified: Secondary | ICD-10-CM | POA: Diagnosis not present

## 2017-03-28 DIAGNOSIS — Z96642 Presence of left artificial hip joint: Secondary | ICD-10-CM

## 2017-03-28 DIAGNOSIS — I062 Rheumatic aortic stenosis with insufficiency: Secondary | ICD-10-CM

## 2017-03-28 DIAGNOSIS — R739 Hyperglycemia, unspecified: Secondary | ICD-10-CM | POA: Diagnosis not present

## 2017-03-28 NOTE — Assessment & Plan Note (Signed)
03/28/17 murmur radiates into the carotids. There is also a bruit versus radiation of the murmur into the abdomen. Clinically aorta is not enlarged. There is no aortic ultrasound on record.

## 2017-03-28 NOTE — Patient Instructions (Signed)
See assessment and plan under each diagnosis in the problem list and acutely for this visit 

## 2017-03-28 NOTE — Assessment & Plan Note (Signed)
Postop hemoglobin 10.5/hematocrit 31.1 Monitor CBC Consider iron supplementation for 6 weeks

## 2017-03-28 NOTE — Progress Notes (Signed)
NURSING HOME LOCATION:  [facility] ROOM NUMBER:  212-A  CODE STATUS:  Full Code  PCP:  Donnamae Jude, MD  [facility]  This is a comprehensive admission note to [facility] performed on this date less than 30 days from date of admission. Included are preadmission medical/surgical history;reconciled medication list; family history; social history and comprehensive review of systems.  Corrections and additions to the records were documented . Comprehensive physical exam was also performed. Additionally a clinical summary was entered for each active diagnosis pertinent to this admission in the Problem List to enhance continuity of care.  HPI: The patient was hospitalized 10/2-10/5/18 for total left hip arthroplasty 10/2 by Dr.Blackman to treat primary osteoarthritis associated with severe, unremitting pain affecting sleep, daily activities, and works/hobbies. Hospitalization was complicated by hypokalemia. Potassium was 2.7 on 10/3. This was corrected with final value by 3.6. The patient is on a combination antihypertensive which contains HCTZ.  She did exhibit mild hyperglycemia with glucoses up to 141. Last A1c was 5.9% on 10/20/15. Postoperatively she had a normochromic, normocytic anemia with hemoglobin 10.5/hematocrit 31.1.  Past medical and surgical history: Includes stroke, mild cognitive impairment with memory loss, aortic stenosis, hypertension, migraine headache, hypothyroidism, and dyslipidemia. 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 She states that she had a steel plate placed after the surgery. 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 Patient is a nondrinker  Family history: Reviewed  Review of systems: The patient is fully oriented and stated that she is seen by Dr. Kennon Rounds at [facility]. She states that yesterday she felt variably  cold and hot. She also stated she had some fever, chills, and sweats. She is afebrile today and staff has not reported any constitutional symptoms. She has had a minor cough which is nonproductive. She's had some rhinitis. Her major symptom is pain in the left hip. She describes ongoing anxiety and depression to some extent. She wears ankle braces for instability.  Constitutional: No significant weight change, fatigue  Eyes: No redness, discharge, pain, vision change ENT/mouth: No nasal congestion,  purulent discharge, earache,change in hearing ,sore throat  Cardiovascular: No chest pain, palpitations,paroxysmal nocturnal dyspnea, claudication, edema  Respiratory: No sputum production,hemoptysis, DOE , significant snoring,apnea  Gastrointestinal: No heartburn,dysphagia,abdominal pain, nausea / vomiting,rectal bleeding, melena,change in bowels Genitourinary: No dysuria,hematuria, pyuria,  incontinence, nocturia Dermatologic: No rash, pruritus, change in appearance of skin Neurologic: No dizziness,headache,syncope, seizures, numbness , tingling Psychiatric: No significant insomnia, anorexia Endocrine: No change in hair/skin/ nails, excessive thirst, excessive hunger, excessive urination  Hematologic/lymphatic: No significant bruising, lymphadenopathy,abnormal bleeding Allergy/immunology: No itchy/ watery eyes, significant sneezing, urticaria, angioedema  Physical exam:  Pertinent or positive findings:There is intermittent exotropia of the right eye. The right eye is light brown & the left light gray. She has an upper plate. She has a grade 2 systolic murmur at the base with radiation into carotids. She also has a faint bruit over the abdomen which may be radiation of the murmur. Aorta is palpable and does not appear enlarged clinically. Posterior tibial pulses are strong. She has DIP OA changes. There is some interosseous wasting particularly at the base of the thumbs.   General  appearance:Adequately nourished; no acute distress , increased work of breathing is present.   Lymphatic: No lymphadenopathy about the head, neck, axilla . Eyes: No conjunctival  inflammation or lid edema is present. There is no scleral icterus. Ears:  External ear exam shows no significant lesions or deformities.   Nose:  External nasal examination shows no deformity or inflammation. Nasal mucosa are pink and moist without lesions ,exudates Oral exam: lips and gums are healthy appearing.There is no oropharyngeal erythema or exudate . Neck:  No thyromegaly, masses, tenderness noted.    Heart:  Normal rate and regular rhythm. S1 and S2 normal without gallop,click, rub .  Lungs:Chest clear to auscultation without wheezes, rhonchi,rales , rubs. Abdomen:Bowel sounds are normal. Abdomen is soft and nontender with no organomegaly, hernias,masses. GU: deferred  Extremities:  No cyanosis, clubbing,edema  Neurologic exam : Strength equal  in upper extremities Balance,Rhomberg,finger to nose testing could not be completed due to clinical state Skin: Warm & dry w/o tenting. No significant lesions or rash.  See clinical summary under each active problem in the Problem List with associated updated therapeutic plan. Dr.Todd  McDiarmid will be notified of her admission and her care transferred to his service. Monitor of A1c and potassium deferred to Dr. Wendy Poet. Potentially HCTZ may need to be D/Ced;but that clinical decision deferred to [facility]. Possible [place] of aorta deferred to Dr Kennon Rounds or Cardiology.

## 2017-03-28 NOTE — Assessment & Plan Note (Signed)
03/28/17 last A1c 5.9% on 10/20/15, patient on HCTZ in combination with an ACE inhibitor Also while hospitalized patient had hypokalemia with a value of 2.7 on 03/20/17 Potassium and A1c will be monitored by [facility]; potential discontinuation of HCTZ deferred to them

## 2017-03-28 NOTE — Assessment & Plan Note (Signed)
03/28/17 pain is improving, follow-up with Dr. Ninfa Linden needs to be verified

## 2017-04-01 NOTE — Telephone Encounter (Signed)
Error

## 2017-04-02 ENCOUNTER — Ambulatory Visit (INDEPENDENT_AMBULATORY_CARE_PROVIDER_SITE_OTHER): Payer: Medicare HMO | Admitting: Orthopaedic Surgery

## 2017-04-03 ENCOUNTER — Other Ambulatory Visit: Payer: Self-pay | Admitting: *Deleted

## 2017-04-03 NOTE — Patient Outreach (Signed)
[facility]) Care Management  04/03/2017  Yvonne [name] Jan 28, 1941 [id number]   Met with patient at facility.  Patient reports that she lives alone, she has one good friend that helps her.  She reports she was independent before her hip surgery.  She plans to return to her former independence.  She drives, manages her own meds and independent with ADLs  RNCM reviewed [facility] care management services.  Left brochure with RNCM contact, patient declines services at this time  Plan to sign off. Royetta Crochet. Laymond Purser, RN, BSN, [place] [phone]) Business Cell  [phone]) Toll Free Office

## 2017-04-09 ENCOUNTER — Ambulatory Visit (INDEPENDENT_AMBULATORY_CARE_PROVIDER_SITE_OTHER): Payer: Medicare HMO | Admitting: Orthopaedic Surgery

## 2017-04-09 ENCOUNTER — Encounter: Payer: Self-pay | Admitting: Adult Health

## 2017-04-09 ENCOUNTER — Other Ambulatory Visit: Payer: Self-pay | Admitting: *Deleted

## 2017-04-09 ENCOUNTER — Non-Acute Institutional Stay (SKILLED_NURSING_FACILITY): Payer: Medicare HMO | Admitting: Adult Health

## 2017-04-09 DIAGNOSIS — M1612 Unilateral primary osteoarthritis, left hip: Secondary | ICD-10-CM | POA: Diagnosis not present

## 2017-04-09 DIAGNOSIS — I10 Essential (primary) hypertension: Secondary | ICD-10-CM | POA: Diagnosis not present

## 2017-04-09 DIAGNOSIS — E038 Other specified hypothyroidism: Secondary | ICD-10-CM

## 2017-04-09 DIAGNOSIS — Z8673 Personal history of transient ischemic attack (TIA), and cerebral infarction without residual deficits: Secondary | ICD-10-CM | POA: Diagnosis not present

## 2017-04-09 DIAGNOSIS — G43009 Migraine without aura, not intractable, without status migrainosus: Secondary | ICD-10-CM | POA: Diagnosis not present

## 2017-04-09 DIAGNOSIS — F339 Major depressive disorder, recurrent, unspecified: Secondary | ICD-10-CM

## 2017-04-09 DIAGNOSIS — D62 Acute posthemorrhagic anemia: Secondary | ICD-10-CM | POA: Diagnosis not present

## 2017-04-09 MED ORDER — OMEGA-3-ACID ETHYL ESTERS 1 G PO CAPS: 1.0000 g | ORAL_CAPSULE | Freq: Two times a day (BID) | ORAL | 0 refills | Status: DC

## 2017-04-09 MED ORDER — LEVOTHYROXINE SODIUM 75 MCG PO TABS: 75.0000 ug | ORAL_TABLET | Freq: Every day | ORAL | 0 refills | Status: DC

## 2017-04-09 MED ORDER — AMITRIPTYLINE HCL 50 MG PO TABS: 150.0000 mg | ORAL_TABLET | Freq: Every day | ORAL | 0 refills | Status: DC

## 2017-04-09 MED ORDER — ATORVASTATIN CALCIUM 10 MG PO TABS: 10.0000 mg | ORAL_TABLET | Freq: Every day | ORAL | 0 refills | Status: DC

## 2017-04-09 MED ORDER — OXYCODONE-ACETAMINOPHEN 5-325 MG PO TABS: 1.0000 | ORAL_TABLET | Freq: Four times a day (QID) | ORAL | 0 refills | Status: DC | PRN

## 2017-04-09 MED ORDER — LIDOCAINE-PRILOCAINE 2.5-2.5 % EX CREA: TOPICAL_CREAM | CUTANEOUS | 0 refills | Status: DC

## 2017-04-09 MED ORDER — METHOCARBAMOL 500 MG PO TABS: 500.0000 mg | ORAL_TABLET | Freq: Four times a day (QID) | ORAL | 0 refills | Status: DC | PRN

## 2017-04-09 MED ORDER — BENAZEPRIL-HYDROCHLOROTHIAZIDE 20-25 MG PO TABS: 1.0000 | ORAL_TABLET | Freq: Every day | ORAL | 0 refills | Status: DC

## 2017-04-09 MED ORDER — AMLODIPINE BESYLATE 5 MG PO TABS: 5.0000 mg | ORAL_TABLET | Freq: Every day | ORAL | 0 refills | Status: DC

## 2017-04-09 NOTE — Patient Outreach (Signed)
Yvonne [name] [facility]) Care Management  04/09/2017  Yvonne [name] 1940/12/14 [id number]   Met with patient at bedside. She reports she is going home tomorrow. She states she has mobile meals and home care. She feels that she will need some home care assistance. She does have Medicaid. She does get Food Stamps She states she is not sure she will need much home health, just some personal care for housekeeping and such.   RNCM spoke with Tillie Rung, SW at facility, she states she will put in order for [facility] services, she also confirms she set up home care services for PT/OT/RN/HHA.  RNCM discussed [facility] care management again with patient, she does not feel she needs services at this time.  Plan to sign off. Yvonne [name]. Yvonne Purser, RN, BSN, [place] [phone]) Business Cell  [phone]) Toll Free Office

## 2017-04-09 NOTE — Progress Notes (Signed)
DATE:  04/09/2017   MRN:  [id number]  BIRTHDAY: 1940/11/28  Facility:  Nursing Home Location:  [facility] Room Number: 212-A  LEVEL OF CARE:  SNF (31)  Contact Information    Name Relation Home Work Mobile   [place] Son [phone]     No name specified           Code Status History    Date Active Date Inactive Code Status Order ID Comments User Context   03/19/2017  4:08 PM 03/22/2017  5:26 PM Full Code [id number]  Mcarthur Rossetti, MD Inpatient       Chief Complaint  Patient presents with  . Discharge Note    Discharge visit    HISTORY OF PRESENT ILLNESS:  This is a 76-YO female seen for a discharge visit.  She is discharging to home on 04/10/17 with home health OT, PT, Nursing, and PCS.    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 She has a PMH of bilateral carotid artery disease, mild cognitive impairment with memory loss, hearing impairment, osteopenia, aortic valve stenosis, and is blind in the right eye.    Patient was admitted to this facility for short-term rehabilitation after the patient's recent hospitalization.  Patient has completed SNF rehabilitation and therapy has cleared the patient for discharge.   PAST MEDICAL HISTORY:  Past Medical History:  Diagnosis Date  . Allergy   . Anxiety   . Aortic stenosis    mild by 07/2011 echo   . Arthritis   . Heart murmur   . Hypertension   . Hypothyroidism   . Impaired functional mobility, balance, gait, and endurance 09/02/2015  . Mild cognitive impairment with memory loss 09/02/2015  . Stroke [facility])    pt was 40  . Thyroid disease      CURRENT MEDICATIONS: Reviewed  Patient's Medications  New Prescriptions   No medications on file  Previous Medications   AMITRIPTYLINE (ELAVIL) 50 MG TABLET    Take 3 tablets (150 mg total) by mouth  at bedtime.   AMLODIPINE (NORVASC) 5 MG TABLET    Take 1 tablet (5 mg total) by mouth daily.   ASPIRIN 81 MG CHEWABLE TABLET    Chew 81 mg by mouth 2 (two) times daily.   ASPIRIN-ACETAMINOPHEN-CAFFEINE (EXCEDRIN MIGRAINE) 250-250-65 MG TABLET    Take 1 tablet by mouth every 6 (six) hours as needed for headache.   ATORVASTATIN (LIPITOR) 10 MG TABLET    Take 10 mg by mouth daily.   BENAZEPRIL-HYDROCHLORTHIAZIDE (LOTENSIN HCT) 20-25 MG TABLET    Take 1 tablet by mouth daily.    LEVOTHYROXINE (SYNTHROID, LEVOTHROID) 75 MCG TABLET    TAKE 1 TABLET (75 MCG TOTAL) BY MOUTH DAILY.   LIDOCAINE-PRILOCAINE (EMLA) CREAM    Apply up to 2 grams three times a day as needed for pain.   METHOCARBAMOL (ROBAXIN) 500 MG TABLET    Take 1 tablet (500 mg total) by mouth every 6 (six) hours as needed for muscle spasms.   MULTIPLE VITAMIN (MULTIVITAMIN WITH MINERALS) TABS TABLET    Take 1 tablet by mouth daily with breakfast.   OMEGA-3 ACID ETHYL ESTERS (LOVAZA) 1 G CAPSULE    Take 1 g by mouth 2 (two) times daily.   OXYCODONE-ACETAMINOPHEN (PERCOCET/ROXICET) 5-325 MG TABLET    Take 1 tablet by mouth every 6 (six)  hours as needed for severe pain.    SENNOSIDES-DOCUSATE SODIUM (SENOKOT-S) 8.6-50 MG TABLET    Take 2 tablets by mouth 2 (two) times daily.  Modified Medications   No medications on file  Discontinued Medications   No medications on file     No Known Allergies   REVIEW OF SYSTEMS:  GENERAL: no change in appetite, no fatigue, no weight changes, no fever, chills or weakness MOUTH and THROAT: Denies oral discomfort  RESPIRATORY: no cough, SOB, DOE, wheezing, hemoptysis CARDIAC: no chest pain, edema or palpitations GI: no abdominal pain, diarrhea, constipation, heart burn, nausea or vomiting GU: Denies dysuria, frequency, hematuria, incontinence, or discharge PSYCHIATRIC: Denies feeling of depression or anxiety. No report of hallucinations, insomnia, paranoia, or agitation   PHYSICAL  EXAMINATION  GENERAL APPEARANCE: Well nourished. In no acute distress. Normal body habitus SKIN:  Skin is warm and dry. There are no suspicious lesions or rash MOUTH and THROAT: Lips are without lesions. Oral mucosa is moist and without lesions.  RESPIRATORY: breathing is even & unlabored, BS CTAB CARDIAC: RRR, no murmur,no extra heart sounds, no edema GI: abdomen soft, normal BS, no masses, no tenderness, no hepatomegaly, no splenomegaly EXTREMITIES: Able to move X 4 extremities, wears bilateral ankle foot brace PSYCHIATRIC: Alert and oriented X 3. Affect and behavior are appropriate   LABS/RADIOLOGY: Labs reviewed: 03/26/17  WBC 11.7 hemoglobin 9.6 hematocrit 28.5 MCV 91.5 platelet 449 glucose 109 calcium 8.6 creatinine 0.48 sodium 140 K3.5 NIDP>82 Basic Metabolic Panel:  Recent Labs  07/25/16 1155 03/08/17 1503 03/20/17 0508 03/20/17 1608  NA 142 137 138  --   K 3.8 3.9 2.7* 3.6  CL 103 102 102  --   CO2 _0 --   GLUCOSE 109* 107* 141*  --   BUN _1 --   CREATININE 0.83 0.61 0.74  --   CALCIUM 9.5 9.2 8.0*  --    Liver Function Tests:  Recent Labs  07/25/16 1155  AST 16  ALT 12  ALKPHOS 71  BILITOT 0.4  PROT 6.8  ALBUMIN 4.0   CBC:  Recent Labs  07/25/16 1155 03/08/17 1503 03/20/17 0508  WBC 9.1 14.6* 16.4*  HGB 13.3 13.6 10.5*  HCT 40.3 41.5 31.1*  MCV 90.6 90.4 89.6  PLT 370 382 304   Lipid Panel:  Recent Labs  07/25/16 1155  HDL 52    Dg Pelvis Portable  Result Date: 03/19/2017 CLINICAL DATA:  76 year old female undergoing left anterior hip replacement EXAM: DG C-ARM 61-120 MIN; PORTABLE PELVIS 1-2 VIEWS COMPARISON:  None. FINDINGS: Two intraoperative radiographs demonstrate surgical changes of left total hip arthroplasty without evidence of hardware complication. Expected subcutaneous emphysema. Mild osteitis pubis. Phleboliths project over the anatomic pelvis. 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 The visualized bowel gas  pattern is unremarkable. IMPRESSION: Left total hip arthroplasty without evidence of immediate complication. Electronically Signed   By: Jacqulynn Cadet M.D.   On: 03/19/2017 15:10   Dg C-arm 61-120 Min  Result Date: 03/19/2017 CLINICAL DATA:  76 year old female undergoing left anterior hip replacement EXAM: DG C-ARM 61-120 MIN; PORTABLE PELVIS 1-2 VIEWS COMPARISON:  None. FINDINGS: Two intraoperative radiographs demonstrate surgical changes of left total hip arthroplasty without evidence of hardware complication. Expected subcutaneous emphysema. Mild osteitis pubis. Phleboliths project over the anatomic pelvis. 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 The visualized bowel gas pattern is unremarkable. IMPRESSION: Left total hip arthroplasty without evidence of immediate complication. Electronically Signed   By: Myrle Sheng  Laurence Ferrari M.D.   On: 03/19/2017 15:10   Dg Hip Operative Unilat W Or W/o Pelvis Left  Result Date: 03/19/2017 CLINICAL DATA:  Left total hip arthroplasty. EXAM: OPERATIVE LEFT HIP (WITH PELVIS IF PERFORMED) TECHNIQUE: Fluoroscopic spot image(s) were submitted for interpretation post-operatively. COMPARISON:  Left hip x-rays dated August 25, 2013. FINDINGS: Intraoperative x-rays demonstrate interval left total hip arthroplasty. Components are well aligned. No evidence of hardware complication. IMPRESSION: Left total hip arthroplasty without evidence of hardware complication. FLUOROSCOPY TIME:  48 seconds. C-arm fluoroscopic images were obtained intraoperatively and submitted for post operative interpretation. Electronically Signed   By: Titus Dubin M.D.   On: 03/19/2017 14:27    ASSESSMENT/PLAN:  1. Primary osteoarthritis of left hip - for home health PT and OT, for therapeutic strengthening exercises, follow-up with orthopedics, Dr. Jean Rosenthal - lidocaine-prilocaine (EMLA) cream; Apply up to 2 grams three times a day as needed for pain.  Dispense: 30 g; Refill: 0 -  methocarbamol (ROBAXIN) 500 MG tablet; Take 1 tablet (500 mg total) by mouth every 6 (six) hours as needed for muscle spasms.  Dispense: 30 tablet; Refill: 0 - oxyCODONE-acetaminophen (PERCOCET/ROXICET) 5-325 MG tablet; Take 1 tablet by mouth every 6 (six) hours as needed for severe pain.  Dispense: 30 tablet; Refill: 0  2. Essential hypertension - well-controlled - amLODipine (NORVASC) 5 MG tablet; Take 1 tablet (5 mg total) by mouth daily.  Dispense: 30 tablet; Refill: 0 - benazepril-hydrochlorthiazide (LOTENSIN HCT) 20-25 MG tablet; Take 1 tablet by mouth daily.  Dispense: 30 tablet; Refill: 0  3. Other specified hypothyroidism Lab Results  Component Value Date   TSH 1.82 07/25/2016   - levothyroxine (SYNTHROID, LEVOTHROID) 75 MCG tablet; Take 1 tablet (75 mcg total) by mouth daily.  Dispense: 30 tablet; Refill: 0  4. Migraine without aura and without status migrainosus, not intractable - stable, continue Excedrin Migraine 250-2 150-65 mg 1 tab every 6 hours when necessary   5. Anemia due to acute blood loss - hgb 9.6, stable   6. History of stroke - stable - atorvastatin (LIPITOR) 10 MG tablet; Take 1 tablet (10 mg total) by mouth daily.  Dispense: 30 tablet; Refill: 0 - omega-3 acid ethyl esters (LOVAZA) 1 g capsule; Take 1 capsule (1 g total) by mouth 2 (two) times daily.  Dispense: 60 capsule; Refill: 0  7. Depression, recurrent (HCC) - mood is stable - amitriptyline (ELAVIL) 50 MG tablet; Take 3 tablets (150 mg total) by mouth at bedtime.  Dispense: 90 tablet; Refill: 0     I have filled out patient's discharge paperwork and written prescriptions.  Patient will receive home health PT, OT, Nursing and PCS.  DME provided: None  Total discharge time: Greater than 30 minutes Greater than 50% was spent in counseling and coordination of care.   Discharge time involved coordination of the discharge process with social worker, nursing staff and therapy department. Medical  justification for home health services verified.    Khristie Sak C. [place] - NP    [employer] [phone]

## 2017-04-11 ENCOUNTER — Other Ambulatory Visit: Payer: Self-pay | Admitting: Family Medicine

## 2017-04-11 DIAGNOSIS — F339 Major depressive disorder, recurrent, unspecified: Secondary | ICD-10-CM

## 2017-04-12 ENCOUNTER — Telehealth (INDEPENDENT_AMBULATORY_CARE_PROVIDER_SITE_OTHER): Payer: Self-pay | Admitting: Radiology

## 2017-04-12 NOTE — Telephone Encounter (Signed)
FYI---HH PT will be going out to see patient on 04/13/17

## 2017-04-13 DIAGNOSIS — I10 Essential (primary) hypertension: Secondary | ICD-10-CM | POA: Diagnosis not present

## 2017-04-13 DIAGNOSIS — G43909 Migraine, unspecified, not intractable, without status migrainosus: Secondary | ICD-10-CM | POA: Diagnosis not present

## 2017-04-13 DIAGNOSIS — M8580 Other specified disorders of bone density and structure, unspecified site: Secondary | ICD-10-CM | POA: Diagnosis not present

## 2017-04-13 DIAGNOSIS — G3184 Mild cognitive impairment, so stated: Secondary | ICD-10-CM | POA: Diagnosis not present

## 2017-04-13 DIAGNOSIS — I062 Rheumatic aortic stenosis with insufficiency: Secondary | ICD-10-CM | POA: Diagnosis not present

## 2017-04-13 DIAGNOSIS — Z471 Aftercare following joint replacement surgery: Secondary | ICD-10-CM | POA: Diagnosis not present

## 2017-04-14 DIAGNOSIS — I062 Rheumatic aortic stenosis with insufficiency: Secondary | ICD-10-CM | POA: Diagnosis not present

## 2017-04-14 DIAGNOSIS — I10 Essential (primary) hypertension: Secondary | ICD-10-CM | POA: Diagnosis not present

## 2017-04-14 DIAGNOSIS — G43909 Migraine, unspecified, not intractable, without status migrainosus: Secondary | ICD-10-CM | POA: Diagnosis not present

## 2017-04-14 DIAGNOSIS — G3184 Mild cognitive impairment, so stated: Secondary | ICD-10-CM | POA: Diagnosis not present

## 2017-04-14 DIAGNOSIS — Z471 Aftercare following joint replacement surgery: Secondary | ICD-10-CM | POA: Diagnosis not present

## 2017-04-14 DIAGNOSIS — M8580 Other specified disorders of bone density and structure, unspecified site: Secondary | ICD-10-CM | POA: Diagnosis not present

## 2017-04-15 ENCOUNTER — Ambulatory Visit (INDEPENDENT_AMBULATORY_CARE_PROVIDER_SITE_OTHER): Payer: Medicare HMO | Admitting: Orthopaedic Surgery

## 2017-04-15 ENCOUNTER — Encounter (INDEPENDENT_AMBULATORY_CARE_PROVIDER_SITE_OTHER): Payer: Self-pay

## 2017-04-15 ENCOUNTER — Telehealth (INDEPENDENT_AMBULATORY_CARE_PROVIDER_SITE_OTHER): Payer: Self-pay | Admitting: Radiology

## 2017-04-15 ENCOUNTER — Telehealth (INDEPENDENT_AMBULATORY_CARE_PROVIDER_SITE_OTHER): Payer: Self-pay | Admitting: Orthopaedic Surgery

## 2017-04-15 DIAGNOSIS — Z96642 Presence of left artificial hip joint: Secondary | ICD-10-CM

## 2017-04-15 NOTE — Telephone Encounter (Signed)
Please advise 

## 2017-04-15 NOTE — Telephone Encounter (Signed)
Willis Modena, [facility] with [facility], requests orders for patient for after care and pain management.  She requests call back at [phone].

## 2017-04-15 NOTE — Progress Notes (Signed)
The patient is now 25 days status post a left total hip arthroplasty direct anterior approach.  She is ambulate with a walker but she is on a walker before this is well.  She says she is doing well.  She said range of motion and strength are improving.  She does use a cane at home.  On exam her left hip looks good in terms of her incision.  There is a mild seroma and I was able to only drain about 20 cc of fluid off of the hip area of the soft tissues.  It does not appear to be infected.  Her leg lengths appear close to that arm.  She tolerates me easily put her hip through range of motion.  At this point she will continue to increase her activities.  I will see her back in 4 weeks to see how she is doing overall but no x-rays are needed.

## 2017-04-15 NOTE — Telephone Encounter (Signed)
Yvonne [name] (PT) with [facility] called needing verbal orders for Home Health (PT) 2 week 4. The number to contact Yvonne [name] is [phone]

## 2017-04-16 DIAGNOSIS — I10 Essential (primary) hypertension: Secondary | ICD-10-CM | POA: Diagnosis not present

## 2017-04-16 DIAGNOSIS — Z471 Aftercare following joint replacement surgery: Secondary | ICD-10-CM | POA: Diagnosis not present

## 2017-04-16 DIAGNOSIS — M8580 Other specified disorders of bone density and structure, unspecified site: Secondary | ICD-10-CM | POA: Diagnosis not present

## 2017-04-16 DIAGNOSIS — G43909 Migraine, unspecified, not intractable, without status migrainosus: Secondary | ICD-10-CM | POA: Diagnosis not present

## 2017-04-16 DIAGNOSIS — G3184 Mild cognitive impairment, so stated: Secondary | ICD-10-CM | POA: Diagnosis not present

## 2017-04-16 DIAGNOSIS — I062 Rheumatic aortic stenosis with insufficiency: Secondary | ICD-10-CM | POA: Diagnosis not present

## 2017-04-17 ENCOUNTER — Telehealth (INDEPENDENT_AMBULATORY_CARE_PROVIDER_SITE_OTHER): Payer: Self-pay | Admitting: Orthopaedic Surgery

## 2017-04-17 DIAGNOSIS — I062 Rheumatic aortic stenosis with insufficiency: Secondary | ICD-10-CM | POA: Diagnosis not present

## 2017-04-17 DIAGNOSIS — I10 Essential (primary) hypertension: Secondary | ICD-10-CM | POA: Diagnosis not present

## 2017-04-17 DIAGNOSIS — M8580 Other specified disorders of bone density and structure, unspecified site: Secondary | ICD-10-CM | POA: Diagnosis not present

## 2017-04-17 DIAGNOSIS — G3184 Mild cognitive impairment, so stated: Secondary | ICD-10-CM | POA: Diagnosis not present

## 2017-04-17 DIAGNOSIS — Z471 Aftercare following joint replacement surgery: Secondary | ICD-10-CM | POA: Diagnosis not present

## 2017-04-17 DIAGNOSIS — G43909 Migraine, unspecified, not intractable, without status migrainosus: Secondary | ICD-10-CM | POA: Diagnosis not present

## 2017-04-17 NOTE — Telephone Encounter (Signed)
Verbal order given  

## 2017-04-17 NOTE — Telephone Encounter (Signed)
Gretchen (PT) called asked for a call back concerning verbal orders so that she can go out to see the patient. Please see previous message.  The number to contact Elzie Rings is [phone]

## 2017-04-18 DIAGNOSIS — G43909 Migraine, unspecified, not intractable, without status migrainosus: Secondary | ICD-10-CM | POA: Diagnosis not present

## 2017-04-18 DIAGNOSIS — M8580 Other specified disorders of bone density and structure, unspecified site: Secondary | ICD-10-CM | POA: Diagnosis not present

## 2017-04-18 DIAGNOSIS — G3184 Mild cognitive impairment, so stated: Secondary | ICD-10-CM | POA: Diagnosis not present

## 2017-04-18 DIAGNOSIS — I10 Essential (primary) hypertension: Secondary | ICD-10-CM | POA: Diagnosis not present

## 2017-04-18 DIAGNOSIS — I062 Rheumatic aortic stenosis with insufficiency: Secondary | ICD-10-CM | POA: Diagnosis not present

## 2017-04-18 DIAGNOSIS — Z471 Aftercare following joint replacement surgery: Secondary | ICD-10-CM | POA: Diagnosis not present

## 2017-04-22 DIAGNOSIS — Z471 Aftercare following joint replacement surgery: Secondary | ICD-10-CM | POA: Diagnosis not present

## 2017-04-22 DIAGNOSIS — I062 Rheumatic aortic stenosis with insufficiency: Secondary | ICD-10-CM | POA: Diagnosis not present

## 2017-04-22 DIAGNOSIS — I10 Essential (primary) hypertension: Secondary | ICD-10-CM | POA: Diagnosis not present

## 2017-04-22 DIAGNOSIS — G43909 Migraine, unspecified, not intractable, without status migrainosus: Secondary | ICD-10-CM | POA: Diagnosis not present

## 2017-04-22 DIAGNOSIS — M8580 Other specified disorders of bone density and structure, unspecified site: Secondary | ICD-10-CM | POA: Diagnosis not present

## 2017-04-22 DIAGNOSIS — G3184 Mild cognitive impairment, so stated: Secondary | ICD-10-CM | POA: Diagnosis not present

## 2017-04-23 ENCOUNTER — Ambulatory Visit: Payer: Medicare HMO | Admitting: Family Medicine

## 2017-04-23 DIAGNOSIS — G3184 Mild cognitive impairment, so stated: Secondary | ICD-10-CM | POA: Diagnosis not present

## 2017-04-23 DIAGNOSIS — Z471 Aftercare following joint replacement surgery: Secondary | ICD-10-CM | POA: Diagnosis not present

## 2017-04-23 DIAGNOSIS — M8580 Other specified disorders of bone density and structure, unspecified site: Secondary | ICD-10-CM | POA: Diagnosis not present

## 2017-04-23 DIAGNOSIS — I10 Essential (primary) hypertension: Secondary | ICD-10-CM | POA: Diagnosis not present

## 2017-04-23 DIAGNOSIS — I062 Rheumatic aortic stenosis with insufficiency: Secondary | ICD-10-CM | POA: Diagnosis not present

## 2017-04-23 DIAGNOSIS — G43909 Migraine, unspecified, not intractable, without status migrainosus: Secondary | ICD-10-CM | POA: Diagnosis not present

## 2017-04-24 ENCOUNTER — Encounter: Payer: Self-pay | Admitting: Family Medicine

## 2017-04-24 ENCOUNTER — Ambulatory Visit (INDEPENDENT_AMBULATORY_CARE_PROVIDER_SITE_OTHER): Payer: Medicare HMO | Admitting: Family Medicine

## 2017-04-24 ENCOUNTER — Other Ambulatory Visit: Payer: Self-pay

## 2017-04-24 VITALS — BP 122/70 | HR 85 | Temp 97.9°F | Ht 61.0 in | Wt 146.0 lb

## 2017-04-24 DIAGNOSIS — I062 Rheumatic aortic stenosis with insufficiency: Secondary | ICD-10-CM | POA: Diagnosis not present

## 2017-04-24 DIAGNOSIS — Z8673 Personal history of transient ischemic attack (TIA), and cerebral infarction without residual deficits: Secondary | ICD-10-CM | POA: Diagnosis not present

## 2017-04-24 DIAGNOSIS — Z96642 Presence of left artificial hip joint: Secondary | ICD-10-CM | POA: Diagnosis not present

## 2017-04-24 DIAGNOSIS — I10 Essential (primary) hypertension: Secondary | ICD-10-CM

## 2017-04-24 DIAGNOSIS — E038 Other specified hypothyroidism: Secondary | ICD-10-CM

## 2017-04-24 DIAGNOSIS — G3184 Mild cognitive impairment, so stated: Secondary | ICD-10-CM | POA: Diagnosis not present

## 2017-04-24 DIAGNOSIS — Z7409 Other reduced mobility: Secondary | ICD-10-CM

## 2017-04-24 DIAGNOSIS — Z471 Aftercare following joint replacement surgery: Secondary | ICD-10-CM | POA: Diagnosis not present

## 2017-04-24 DIAGNOSIS — G43909 Migraine, unspecified, not intractable, without status migrainosus: Secondary | ICD-10-CM | POA: Diagnosis not present

## 2017-04-24 DIAGNOSIS — M8580 Other specified disorders of bone density and structure, unspecified site: Secondary | ICD-10-CM | POA: Diagnosis not present

## 2017-04-24 MED ORDER — ATORVASTATIN CALCIUM 10 MG PO TABS: 10.0000 mg | ORAL_TABLET | Freq: Every day | ORAL | 3 refills | Status: DC

## 2017-04-24 MED ORDER — BENAZEPRIL-HYDROCHLOROTHIAZIDE 20-25 MG PO TABS: 1.0000 | ORAL_TABLET | Freq: Every day | ORAL | 3 refills | Status: DC

## 2017-04-24 MED ORDER — AMLODIPINE BESYLATE 5 MG PO TABS: 5.0000 mg | ORAL_TABLET | Freq: Every day | ORAL | 3 refills | Status: DC

## 2017-04-24 MED ORDER — LEVOTHYROXINE SODIUM 75 MCG PO TABS: 75.0000 ug | ORAL_TABLET | Freq: Every day | ORAL | 3 refills | Status: DC

## 2017-04-24 NOTE — Assessment & Plan Note (Signed)
Continue Levothyroxine repletion

## 2017-04-24 NOTE — Assessment & Plan Note (Signed)
Continue to use walker and use PT as much as needed.

## 2017-04-24 NOTE — Progress Notes (Signed)
   Subjective:    Patient ID: Yvonne [name] is a 76 y.o. female presenting with Follow-up (hip surgery)  on 04/24/2017  HPI: Here today for f/u. She is s/p hip replacement and doing well. Has some therapies happening. She reports no pain in her knees or hips. Told she has some neuropathy. Has had normal diabetes screen and B12 screen in the past. Needs meds refilled. Labs 02/18. Denies headache.  Review of Systems  Constitutional: Negative for chills and fever.  Respiratory: Negative for shortness of breath.   Cardiovascular: Negative for chest pain.  Gastrointestinal: Negative for abdominal pain, nausea and vomiting.  Genitourinary: Negative for dysuria.  Skin: Negative for rash.      Objective:    BP 122/70   Pulse 85   Temp 97.9 F (36.6 C) (Oral)   Ht 5\' 1"  (1.549 m)   Wt 146 lb (66.2 kg)   SpO2 93%   BMI 27.59 kg/m  Physical Exam  Constitutional: She is oriented to person, place, and time. She appears well-developed and well-nourished. No distress.  HENT:  Head: Normocephalic and atraumatic.  Eyes: No scleral icterus.  Neck: Neck supple.  Cardiovascular: Normal rate.  Pulmonary/Chest: Effort normal.  Abdominal: Soft.  Musculoskeletal: She exhibits no edema or tenderness.  Incision on hip is well healed.  Neurological: She is alert and oriented to person, place, and time.  Skin: Skin is warm and dry.  Psychiatric: She has a normal mood and affect.        Assessment & Plan:   Problem List Items Addressed This Visit      Unprioritized   Hypothyroidism - Primary    Continue Levothyroxine repletion      Relevant Medications   levothyroxine (SYNTHROID, LEVOTHROID) 75 MCG tablet   Essential hypertension    Continue Lotensin HCTZ and Norvasc.      Relevant Medications   benazepril-hydrochlorthiazide (LOTENSIN HCT) 20-25 MG tablet   atorvastatin (LIPITOR) 10 MG tablet   amLODipine (NORVASC) 5 MG tablet   Impaired functional mobility, balance, gait, and  endurance    Continue to use walker and use PT as much as needed.      Status post total replacement of left hip    Healing well.       Other Visit Diagnoses    History of stroke       Relevant Medications   atorvastatin (LIPITOR) 10 MG tablet      Total face-to-face time with patient: 15 minutes. Over 50% of encounter was spent on counseling and coordination of care. Return in about 3 months (around 07/25/2017).  Donnamae Jude 04/24/2017 4:01 PM

## 2017-04-24 NOTE — Assessment & Plan Note (Signed)
Healing well.

## 2017-04-24 NOTE — Assessment & Plan Note (Signed)
Continue Lotensin HCTZ and Norvasc.

## 2017-04-24 NOTE — Patient Instructions (Signed)

## 2017-04-25 DIAGNOSIS — Z471 Aftercare following joint replacement surgery: Secondary | ICD-10-CM | POA: Diagnosis not present

## 2017-04-25 DIAGNOSIS — I10 Essential (primary) hypertension: Secondary | ICD-10-CM | POA: Diagnosis not present

## 2017-04-25 DIAGNOSIS — M8580 Other specified disorders of bone density and structure, unspecified site: Secondary | ICD-10-CM | POA: Diagnosis not present

## 2017-04-25 DIAGNOSIS — G43909 Migraine, unspecified, not intractable, without status migrainosus: Secondary | ICD-10-CM | POA: Diagnosis not present

## 2017-04-25 DIAGNOSIS — I062 Rheumatic aortic stenosis with insufficiency: Secondary | ICD-10-CM | POA: Diagnosis not present

## 2017-04-25 DIAGNOSIS — G3184 Mild cognitive impairment, so stated: Secondary | ICD-10-CM | POA: Diagnosis not present

## 2017-04-29 DIAGNOSIS — M8580 Other specified disorders of bone density and structure, unspecified site: Secondary | ICD-10-CM | POA: Diagnosis not present

## 2017-04-29 DIAGNOSIS — I062 Rheumatic aortic stenosis with insufficiency: Secondary | ICD-10-CM | POA: Diagnosis not present

## 2017-04-29 DIAGNOSIS — G43909 Migraine, unspecified, not intractable, without status migrainosus: Secondary | ICD-10-CM | POA: Diagnosis not present

## 2017-04-29 DIAGNOSIS — G3184 Mild cognitive impairment, so stated: Secondary | ICD-10-CM | POA: Diagnosis not present

## 2017-04-29 DIAGNOSIS — Z471 Aftercare following joint replacement surgery: Secondary | ICD-10-CM | POA: Diagnosis not present

## 2017-04-29 DIAGNOSIS — I10 Essential (primary) hypertension: Secondary | ICD-10-CM | POA: Diagnosis not present

## 2017-04-30 DIAGNOSIS — G3184 Mild cognitive impairment, so stated: Secondary | ICD-10-CM | POA: Diagnosis not present

## 2017-04-30 DIAGNOSIS — I062 Rheumatic aortic stenosis with insufficiency: Secondary | ICD-10-CM | POA: Diagnosis not present

## 2017-04-30 DIAGNOSIS — I10 Essential (primary) hypertension: Secondary | ICD-10-CM | POA: Diagnosis not present

## 2017-04-30 DIAGNOSIS — M8580 Other specified disorders of bone density and structure, unspecified site: Secondary | ICD-10-CM | POA: Diagnosis not present

## 2017-04-30 DIAGNOSIS — Z471 Aftercare following joint replacement surgery: Secondary | ICD-10-CM | POA: Diagnosis not present

## 2017-04-30 DIAGNOSIS — G43909 Migraine, unspecified, not intractable, without status migrainosus: Secondary | ICD-10-CM | POA: Diagnosis not present

## 2017-05-01 DIAGNOSIS — I10 Essential (primary) hypertension: Secondary | ICD-10-CM | POA: Diagnosis not present

## 2017-05-01 DIAGNOSIS — M1990 Unspecified osteoarthritis, unspecified site: Secondary | ICD-10-CM | POA: Diagnosis not present

## 2017-05-01 DIAGNOSIS — G43909 Migraine, unspecified, not intractable, without status migrainosus: Secondary | ICD-10-CM | POA: Diagnosis not present

## 2017-05-01 DIAGNOSIS — M8580 Other specified disorders of bone density and structure, unspecified site: Secondary | ICD-10-CM | POA: Diagnosis not present

## 2017-05-01 DIAGNOSIS — Z471 Aftercare following joint replacement surgery: Secondary | ICD-10-CM | POA: Diagnosis not present

## 2017-05-01 DIAGNOSIS — G3184 Mild cognitive impairment, so stated: Secondary | ICD-10-CM | POA: Diagnosis not present

## 2017-05-01 DIAGNOSIS — I062 Rheumatic aortic stenosis with insufficiency: Secondary | ICD-10-CM | POA: Diagnosis not present

## 2017-05-02 DIAGNOSIS — M1990 Unspecified osteoarthritis, unspecified site: Secondary | ICD-10-CM | POA: Diagnosis not present

## 2017-05-03 DIAGNOSIS — Z471 Aftercare following joint replacement surgery: Secondary | ICD-10-CM | POA: Diagnosis not present

## 2017-05-03 DIAGNOSIS — M1990 Unspecified osteoarthritis, unspecified site: Secondary | ICD-10-CM | POA: Diagnosis not present

## 2017-05-03 DIAGNOSIS — I10 Essential (primary) hypertension: Secondary | ICD-10-CM | POA: Diagnosis not present

## 2017-05-03 DIAGNOSIS — G43909 Migraine, unspecified, not intractable, without status migrainosus: Secondary | ICD-10-CM | POA: Diagnosis not present

## 2017-05-03 DIAGNOSIS — I062 Rheumatic aortic stenosis with insufficiency: Secondary | ICD-10-CM | POA: Diagnosis not present

## 2017-05-03 DIAGNOSIS — G3184 Mild cognitive impairment, so stated: Secondary | ICD-10-CM | POA: Diagnosis not present

## 2017-05-03 DIAGNOSIS — M8580 Other specified disorders of bone density and structure, unspecified site: Secondary | ICD-10-CM | POA: Diagnosis not present

## 2017-05-06 DIAGNOSIS — G3184 Mild cognitive impairment, so stated: Secondary | ICD-10-CM | POA: Diagnosis not present

## 2017-05-06 DIAGNOSIS — G43909 Migraine, unspecified, not intractable, without status migrainosus: Secondary | ICD-10-CM | POA: Diagnosis not present

## 2017-05-06 DIAGNOSIS — I062 Rheumatic aortic stenosis with insufficiency: Secondary | ICD-10-CM | POA: Diagnosis not present

## 2017-05-06 DIAGNOSIS — Z471 Aftercare following joint replacement surgery: Secondary | ICD-10-CM | POA: Diagnosis not present

## 2017-05-06 DIAGNOSIS — M8580 Other specified disorders of bone density and structure, unspecified site: Secondary | ICD-10-CM | POA: Diagnosis not present

## 2017-05-06 DIAGNOSIS — I10 Essential (primary) hypertension: Secondary | ICD-10-CM | POA: Diagnosis not present

## 2017-05-07 ENCOUNTER — Other Ambulatory Visit: Payer: Self-pay | Admitting: Family Medicine

## 2017-05-07 DIAGNOSIS — G3184 Mild cognitive impairment, so stated: Secondary | ICD-10-CM | POA: Diagnosis not present

## 2017-05-07 DIAGNOSIS — G43909 Migraine, unspecified, not intractable, without status migrainosus: Secondary | ICD-10-CM | POA: Diagnosis not present

## 2017-05-07 DIAGNOSIS — M1990 Unspecified osteoarthritis, unspecified site: Secondary | ICD-10-CM | POA: Diagnosis not present

## 2017-05-07 DIAGNOSIS — Z471 Aftercare following joint replacement surgery: Secondary | ICD-10-CM | POA: Diagnosis not present

## 2017-05-07 DIAGNOSIS — I10 Essential (primary) hypertension: Secondary | ICD-10-CM | POA: Diagnosis not present

## 2017-05-07 DIAGNOSIS — M8580 Other specified disorders of bone density and structure, unspecified site: Secondary | ICD-10-CM | POA: Diagnosis not present

## 2017-05-07 DIAGNOSIS — I062 Rheumatic aortic stenosis with insufficiency: Secondary | ICD-10-CM | POA: Diagnosis not present

## 2017-05-07 NOTE — Progress Notes (Signed)
Patient sent letter asking for Gabapentin--I would like to discuss with her at our next visit, prior to just prescribing this. There are a lot of side effects to review and we may need to stop her elavil if this starts. Please let her know this. I have filled out a handicap placard for her car. She will need to come pick it up or we can mail to her.

## 2017-05-08 ENCOUNTER — Telehealth: Payer: Self-pay | Admitting: *Deleted

## 2017-05-08 DIAGNOSIS — M8580 Other specified disorders of bone density and structure, unspecified site: Secondary | ICD-10-CM | POA: Diagnosis not present

## 2017-05-08 DIAGNOSIS — M1990 Unspecified osteoarthritis, unspecified site: Secondary | ICD-10-CM | POA: Diagnosis not present

## 2017-05-08 DIAGNOSIS — G3184 Mild cognitive impairment, so stated: Secondary | ICD-10-CM | POA: Diagnosis not present

## 2017-05-08 DIAGNOSIS — G43909 Migraine, unspecified, not intractable, without status migrainosus: Secondary | ICD-10-CM | POA: Diagnosis not present

## 2017-05-08 DIAGNOSIS — I10 Essential (primary) hypertension: Secondary | ICD-10-CM | POA: Diagnosis not present

## 2017-05-08 DIAGNOSIS — I062 Rheumatic aortic stenosis with insufficiency: Secondary | ICD-10-CM | POA: Diagnosis not present

## 2017-05-08 DIAGNOSIS — Z471 Aftercare following joint replacement surgery: Secondary | ICD-10-CM | POA: Diagnosis not present

## 2017-05-08 NOTE — Progress Notes (Signed)
Patient informed.  Caira Poche,CMA  

## 2017-05-08 NOTE — Telephone Encounter (Signed)
Patient requested a script for gabapentin. Cydnie Deason,CMA

## 2017-05-08 NOTE — Telephone Encounter (Signed)
Patient informed that she would need to discuss at next visit. Jazmin Hartsell,CMA

## 2017-05-09 DIAGNOSIS — M1990 Unspecified osteoarthritis, unspecified site: Secondary | ICD-10-CM | POA: Diagnosis not present

## 2017-05-10 DIAGNOSIS — M1990 Unspecified osteoarthritis, unspecified site: Secondary | ICD-10-CM | POA: Diagnosis not present

## 2017-05-14 DIAGNOSIS — M1990 Unspecified osteoarthritis, unspecified site: Secondary | ICD-10-CM | POA: Diagnosis not present

## 2017-05-15 DIAGNOSIS — M1990 Unspecified osteoarthritis, unspecified site: Secondary | ICD-10-CM | POA: Diagnosis not present

## 2017-05-16 ENCOUNTER — Encounter (INDEPENDENT_AMBULATORY_CARE_PROVIDER_SITE_OTHER): Payer: Self-pay | Admitting: Orthopaedic Surgery

## 2017-05-16 ENCOUNTER — Ambulatory Visit (INDEPENDENT_AMBULATORY_CARE_PROVIDER_SITE_OTHER): Payer: Medicare HMO | Admitting: Orthopaedic Surgery

## 2017-05-16 DIAGNOSIS — M1990 Unspecified osteoarthritis, unspecified site: Secondary | ICD-10-CM | POA: Diagnosis not present

## 2017-05-16 DIAGNOSIS — Z96642 Presence of left artificial hip joint: Secondary | ICD-10-CM

## 2017-05-16 MED ORDER — GABAPENTIN 100 MG PO CAPS: 100.0000 mg | ORAL_CAPSULE | Freq: Two times a day (BID) | ORAL | 3 refills | Status: DC

## 2017-05-16 NOTE — Progress Notes (Signed)
The patient is 7 weeks status post a left total hip arthroplasty through direct anterior approach.  She is doing well overall.  She is 76 years old and does use a rolling walker.  She said range of motion and strength are improving.  She is someone who would like to take Neurontin for pain.  She is never had this before but is read about and has friends that take it.  On examination of her left hip I can easily put her left hip through full range of motion with no pain at all.  Incisions well-healed.  Her leg lengths are equal.  At this point I did counsel her about trying Neurontin.  We will start a low-dose that she is 100 mg to take just at night for a week and then she can increase it to twice a day and even up to 3 times a day.  I do not need to see her back myself for 6 months.  At that visit I would like a low AP pelvis and lateral of her left operative hip.

## 2017-05-17 DIAGNOSIS — I062 Rheumatic aortic stenosis with insufficiency: Secondary | ICD-10-CM | POA: Diagnosis not present

## 2017-05-17 DIAGNOSIS — G43909 Migraine, unspecified, not intractable, without status migrainosus: Secondary | ICD-10-CM | POA: Diagnosis not present

## 2017-05-17 DIAGNOSIS — G3184 Mild cognitive impairment, so stated: Secondary | ICD-10-CM | POA: Diagnosis not present

## 2017-05-17 DIAGNOSIS — I10 Essential (primary) hypertension: Secondary | ICD-10-CM | POA: Diagnosis not present

## 2017-05-17 DIAGNOSIS — M8580 Other specified disorders of bone density and structure, unspecified site: Secondary | ICD-10-CM | POA: Diagnosis not present

## 2017-05-17 DIAGNOSIS — Z471 Aftercare following joint replacement surgery: Secondary | ICD-10-CM | POA: Diagnosis not present

## 2017-05-17 DIAGNOSIS — M1990 Unspecified osteoarthritis, unspecified site: Secondary | ICD-10-CM | POA: Diagnosis not present

## 2017-05-21 DIAGNOSIS — M1990 Unspecified osteoarthritis, unspecified site: Secondary | ICD-10-CM | POA: Diagnosis not present

## 2017-05-22 DIAGNOSIS — M1990 Unspecified osteoarthritis, unspecified site: Secondary | ICD-10-CM | POA: Diagnosis not present

## 2017-05-23 DIAGNOSIS — M1990 Unspecified osteoarthritis, unspecified site: Secondary | ICD-10-CM | POA: Diagnosis not present

## 2017-05-24 DIAGNOSIS — M1990 Unspecified osteoarthritis, unspecified site: Secondary | ICD-10-CM | POA: Diagnosis not present

## 2017-05-28 DIAGNOSIS — M1990 Unspecified osteoarthritis, unspecified site: Secondary | ICD-10-CM | POA: Diagnosis not present

## 2017-05-29 DIAGNOSIS — M1990 Unspecified osteoarthritis, unspecified site: Secondary | ICD-10-CM | POA: Diagnosis not present

## 2017-05-30 DIAGNOSIS — M1990 Unspecified osteoarthritis, unspecified site: Secondary | ICD-10-CM | POA: Diagnosis not present

## 2017-05-31 DIAGNOSIS — M1990 Unspecified osteoarthritis, unspecified site: Secondary | ICD-10-CM | POA: Diagnosis not present

## 2017-06-04 DIAGNOSIS — M1990 Unspecified osteoarthritis, unspecified site: Secondary | ICD-10-CM | POA: Diagnosis not present

## 2017-06-05 DIAGNOSIS — M1990 Unspecified osteoarthritis, unspecified site: Secondary | ICD-10-CM | POA: Diagnosis not present

## 2017-06-06 DIAGNOSIS — M1990 Unspecified osteoarthritis, unspecified site: Secondary | ICD-10-CM | POA: Diagnosis not present

## 2017-06-07 DIAGNOSIS — M1990 Unspecified osteoarthritis, unspecified site: Secondary | ICD-10-CM | POA: Diagnosis not present

## 2017-06-11 DIAGNOSIS — M1990 Unspecified osteoarthritis, unspecified site: Secondary | ICD-10-CM | POA: Diagnosis not present

## 2017-06-12 DIAGNOSIS — M1990 Unspecified osteoarthritis, unspecified site: Secondary | ICD-10-CM | POA: Diagnosis not present

## 2017-06-13 DIAGNOSIS — M1990 Unspecified osteoarthritis, unspecified site: Secondary | ICD-10-CM | POA: Diagnosis not present

## 2017-06-14 DIAGNOSIS — M1990 Unspecified osteoarthritis, unspecified site: Secondary | ICD-10-CM | POA: Diagnosis not present

## 2017-06-18 DIAGNOSIS — M1990 Unspecified osteoarthritis, unspecified site: Secondary | ICD-10-CM | POA: Diagnosis not present

## 2017-06-19 DIAGNOSIS — M1990 Unspecified osteoarthritis, unspecified site: Secondary | ICD-10-CM | POA: Diagnosis not present

## 2017-06-20 DIAGNOSIS — M1990 Unspecified osteoarthritis, unspecified site: Secondary | ICD-10-CM | POA: Diagnosis not present

## 2017-06-21 DIAGNOSIS — M1990 Unspecified osteoarthritis, unspecified site: Secondary | ICD-10-CM | POA: Diagnosis not present

## 2017-06-25 DIAGNOSIS — M1990 Unspecified osteoarthritis, unspecified site: Secondary | ICD-10-CM | POA: Diagnosis not present

## 2017-06-26 DIAGNOSIS — M1990 Unspecified osteoarthritis, unspecified site: Secondary | ICD-10-CM | POA: Diagnosis not present

## 2017-06-27 DIAGNOSIS — M1990 Unspecified osteoarthritis, unspecified site: Secondary | ICD-10-CM | POA: Diagnosis not present

## 2017-06-28 DIAGNOSIS — M1990 Unspecified osteoarthritis, unspecified site: Secondary | ICD-10-CM | POA: Diagnosis not present

## 2017-07-02 DIAGNOSIS — M1990 Unspecified osteoarthritis, unspecified site: Secondary | ICD-10-CM | POA: Diagnosis not present

## 2017-07-03 DIAGNOSIS — M1990 Unspecified osteoarthritis, unspecified site: Secondary | ICD-10-CM | POA: Diagnosis not present

## 2017-07-04 DIAGNOSIS — M1990 Unspecified osteoarthritis, unspecified site: Secondary | ICD-10-CM | POA: Diagnosis not present

## 2017-07-05 DIAGNOSIS — M1990 Unspecified osteoarthritis, unspecified site: Secondary | ICD-10-CM | POA: Diagnosis not present

## 2017-07-09 DIAGNOSIS — M1990 Unspecified osteoarthritis, unspecified site: Secondary | ICD-10-CM | POA: Diagnosis not present

## 2017-07-10 DIAGNOSIS — M1990 Unspecified osteoarthritis, unspecified site: Secondary | ICD-10-CM | POA: Diagnosis not present

## 2017-07-11 DIAGNOSIS — M1990 Unspecified osteoarthritis, unspecified site: Secondary | ICD-10-CM | POA: Diagnosis not present

## 2017-07-12 DIAGNOSIS — M1990 Unspecified osteoarthritis, unspecified site: Secondary | ICD-10-CM | POA: Diagnosis not present

## 2017-07-16 ENCOUNTER — Other Ambulatory Visit (INDEPENDENT_AMBULATORY_CARE_PROVIDER_SITE_OTHER): Payer: Self-pay

## 2017-07-16 ENCOUNTER — Telehealth (INDEPENDENT_AMBULATORY_CARE_PROVIDER_SITE_OTHER): Payer: Self-pay

## 2017-07-16 ENCOUNTER — Other Ambulatory Visit: Payer: Self-pay | Admitting: Family Medicine

## 2017-07-16 DIAGNOSIS — Z8673 Personal history of transient ischemic attack (TIA), and cerebral infarction without residual deficits: Secondary | ICD-10-CM

## 2017-07-16 DIAGNOSIS — M1990 Unspecified osteoarthritis, unspecified site: Secondary | ICD-10-CM | POA: Diagnosis not present

## 2017-07-16 MED ORDER — GABAPENTIN 100 MG PO CAPS: 100.0000 mg | ORAL_CAPSULE | Freq: Two times a day (BID) | ORAL | 0 refills | Status: DC

## 2017-07-16 NOTE — Telephone Encounter (Signed)
[place] for [place] to refill

## 2017-07-16 NOTE — Telephone Encounter (Signed)
Sent to her pharmacy. 

## 2017-07-16 NOTE — Telephone Encounter (Signed)
Patient wanting Rx for Gabapentin, ok to fill, or have PCP fill?

## 2017-07-17 DIAGNOSIS — M1990 Unspecified osteoarthritis, unspecified site: Secondary | ICD-10-CM | POA: Diagnosis not present

## 2017-07-18 DIAGNOSIS — M1990 Unspecified osteoarthritis, unspecified site: Secondary | ICD-10-CM | POA: Diagnosis not present

## 2017-07-23 DIAGNOSIS — M1990 Unspecified osteoarthritis, unspecified site: Secondary | ICD-10-CM | POA: Diagnosis not present

## 2017-07-24 DIAGNOSIS — M1990 Unspecified osteoarthritis, unspecified site: Secondary | ICD-10-CM | POA: Diagnosis not present

## 2017-07-25 DIAGNOSIS — M1990 Unspecified osteoarthritis, unspecified site: Secondary | ICD-10-CM | POA: Diagnosis not present

## 2017-07-26 DIAGNOSIS — M1990 Unspecified osteoarthritis, unspecified site: Secondary | ICD-10-CM | POA: Diagnosis not present

## 2017-07-30 DIAGNOSIS — M1990 Unspecified osteoarthritis, unspecified site: Secondary | ICD-10-CM | POA: Diagnosis not present

## 2017-07-31 DIAGNOSIS — M1990 Unspecified osteoarthritis, unspecified site: Secondary | ICD-10-CM | POA: Diagnosis not present

## 2017-08-01 DIAGNOSIS — M1990 Unspecified osteoarthritis, unspecified site: Secondary | ICD-10-CM | POA: Diagnosis not present

## 2017-08-02 DIAGNOSIS — M1990 Unspecified osteoarthritis, unspecified site: Secondary | ICD-10-CM | POA: Diagnosis not present

## 2017-08-06 DIAGNOSIS — M1990 Unspecified osteoarthritis, unspecified site: Secondary | ICD-10-CM | POA: Diagnosis not present

## 2017-08-07 DIAGNOSIS — M1990 Unspecified osteoarthritis, unspecified site: Secondary | ICD-10-CM | POA: Diagnosis not present

## 2017-08-08 DIAGNOSIS — M1990 Unspecified osteoarthritis, unspecified site: Secondary | ICD-10-CM | POA: Diagnosis not present

## 2017-08-09 DIAGNOSIS — M1990 Unspecified osteoarthritis, unspecified site: Secondary | ICD-10-CM | POA: Diagnosis not present

## 2017-08-13 DIAGNOSIS — M1990 Unspecified osteoarthritis, unspecified site: Secondary | ICD-10-CM | POA: Diagnosis not present

## 2017-08-14 DIAGNOSIS — M1990 Unspecified osteoarthritis, unspecified site: Secondary | ICD-10-CM | POA: Diagnosis not present

## 2017-08-15 DIAGNOSIS — M1990 Unspecified osteoarthritis, unspecified site: Secondary | ICD-10-CM | POA: Diagnosis not present

## 2017-08-16 DIAGNOSIS — M1990 Unspecified osteoarthritis, unspecified site: Secondary | ICD-10-CM | POA: Diagnosis not present

## 2017-08-20 DIAGNOSIS — M1990 Unspecified osteoarthritis, unspecified site: Secondary | ICD-10-CM | POA: Diagnosis not present

## 2017-08-21 DIAGNOSIS — M1990 Unspecified osteoarthritis, unspecified site: Secondary | ICD-10-CM | POA: Diagnosis not present

## 2017-08-22 DIAGNOSIS — M1990 Unspecified osteoarthritis, unspecified site: Secondary | ICD-10-CM | POA: Diagnosis not present

## 2017-08-23 DIAGNOSIS — M1990 Unspecified osteoarthritis, unspecified site: Secondary | ICD-10-CM | POA: Diagnosis not present

## 2017-08-27 DIAGNOSIS — M1990 Unspecified osteoarthritis, unspecified site: Secondary | ICD-10-CM | POA: Diagnosis not present

## 2017-08-28 DIAGNOSIS — M1990 Unspecified osteoarthritis, unspecified site: Secondary | ICD-10-CM | POA: Diagnosis not present

## 2017-08-29 DIAGNOSIS — M1990 Unspecified osteoarthritis, unspecified site: Secondary | ICD-10-CM | POA: Diagnosis not present

## 2017-08-30 DIAGNOSIS — M1990 Unspecified osteoarthritis, unspecified site: Secondary | ICD-10-CM | POA: Diagnosis not present

## 2017-09-03 DIAGNOSIS — M1990 Unspecified osteoarthritis, unspecified site: Secondary | ICD-10-CM | POA: Diagnosis not present

## 2017-09-04 DIAGNOSIS — M1990 Unspecified osteoarthritis, unspecified site: Secondary | ICD-10-CM | POA: Diagnosis not present

## 2017-09-05 DIAGNOSIS — M1990 Unspecified osteoarthritis, unspecified site: Secondary | ICD-10-CM | POA: Diagnosis not present

## 2017-09-06 DIAGNOSIS — M1990 Unspecified osteoarthritis, unspecified site: Secondary | ICD-10-CM | POA: Diagnosis not present

## 2017-09-10 DIAGNOSIS — M1990 Unspecified osteoarthritis, unspecified site: Secondary | ICD-10-CM | POA: Diagnosis not present

## 2017-09-11 DIAGNOSIS — M1990 Unspecified osteoarthritis, unspecified site: Secondary | ICD-10-CM | POA: Diagnosis not present

## 2017-09-12 DIAGNOSIS — M1990 Unspecified osteoarthritis, unspecified site: Secondary | ICD-10-CM | POA: Diagnosis not present

## 2017-09-13 DIAGNOSIS — M1990 Unspecified osteoarthritis, unspecified site: Secondary | ICD-10-CM | POA: Diagnosis not present

## 2017-09-17 DIAGNOSIS — M1990 Unspecified osteoarthritis, unspecified site: Secondary | ICD-10-CM | POA: Diagnosis not present

## 2017-09-18 DIAGNOSIS — M1990 Unspecified osteoarthritis, unspecified site: Secondary | ICD-10-CM | POA: Diagnosis not present

## 2017-09-19 DIAGNOSIS — M1990 Unspecified osteoarthritis, unspecified site: Secondary | ICD-10-CM | POA: Diagnosis not present

## 2017-09-20 DIAGNOSIS — M1990 Unspecified osteoarthritis, unspecified site: Secondary | ICD-10-CM | POA: Diagnosis not present

## 2017-09-24 DIAGNOSIS — M1990 Unspecified osteoarthritis, unspecified site: Secondary | ICD-10-CM | POA: Diagnosis not present

## 2017-09-25 DIAGNOSIS — M1990 Unspecified osteoarthritis, unspecified site: Secondary | ICD-10-CM | POA: Diagnosis not present

## 2017-09-26 DIAGNOSIS — M1990 Unspecified osteoarthritis, unspecified site: Secondary | ICD-10-CM | POA: Diagnosis not present

## 2017-09-27 DIAGNOSIS — M1990 Unspecified osteoarthritis, unspecified site: Secondary | ICD-10-CM | POA: Diagnosis not present

## 2017-10-01 DIAGNOSIS — M1990 Unspecified osteoarthritis, unspecified site: Secondary | ICD-10-CM | POA: Diagnosis not present

## 2017-10-02 DIAGNOSIS — M1990 Unspecified osteoarthritis, unspecified site: Secondary | ICD-10-CM | POA: Diagnosis not present

## 2017-10-03 DIAGNOSIS — M1990 Unspecified osteoarthritis, unspecified site: Secondary | ICD-10-CM | POA: Diagnosis not present

## 2017-10-04 DIAGNOSIS — M1990 Unspecified osteoarthritis, unspecified site: Secondary | ICD-10-CM | POA: Diagnosis not present

## 2017-10-08 DIAGNOSIS — M1990 Unspecified osteoarthritis, unspecified site: Secondary | ICD-10-CM | POA: Diagnosis not present

## 2017-10-09 DIAGNOSIS — M1990 Unspecified osteoarthritis, unspecified site: Secondary | ICD-10-CM | POA: Diagnosis not present

## 2017-10-10 DIAGNOSIS — M1990 Unspecified osteoarthritis, unspecified site: Secondary | ICD-10-CM | POA: Diagnosis not present

## 2017-10-11 DIAGNOSIS — M1990 Unspecified osteoarthritis, unspecified site: Secondary | ICD-10-CM | POA: Diagnosis not present

## 2017-10-15 DIAGNOSIS — M1990 Unspecified osteoarthritis, unspecified site: Secondary | ICD-10-CM | POA: Diagnosis not present

## 2017-10-15 DIAGNOSIS — Z9889 Other specified postprocedural states: Secondary | ICD-10-CM | POA: Diagnosis not present

## 2017-10-15 DIAGNOSIS — Z961 Presence of intraocular lens: Secondary | ICD-10-CM | POA: Diagnosis not present

## 2017-10-15 DIAGNOSIS — H50111 Monocular exotropia, right eye: Secondary | ICD-10-CM | POA: Diagnosis not present

## 2017-10-15 DIAGNOSIS — H25811 Combined forms of age-related cataract, right eye: Secondary | ICD-10-CM | POA: Diagnosis not present

## 2017-10-15 DIAGNOSIS — H40013 Open angle with borderline findings, low risk, bilateral: Secondary | ICD-10-CM | POA: Diagnosis not present

## 2017-10-15 DIAGNOSIS — H5703 Miosis: Secondary | ICD-10-CM | POA: Diagnosis not present

## 2017-10-15 DIAGNOSIS — H04123 Dry eye syndrome of bilateral lacrimal glands: Secondary | ICD-10-CM | POA: Diagnosis not present

## 2017-10-15 LAB — HM DIABETES EYE EXAM

## 2017-10-16 DIAGNOSIS — M1990 Unspecified osteoarthritis, unspecified site: Secondary | ICD-10-CM | POA: Diagnosis not present

## 2017-10-16 DIAGNOSIS — H2511 Age-related nuclear cataract, right eye: Secondary | ICD-10-CM | POA: Diagnosis not present

## 2017-10-17 DIAGNOSIS — M1990 Unspecified osteoarthritis, unspecified site: Secondary | ICD-10-CM | POA: Diagnosis not present

## 2017-10-18 DIAGNOSIS — M1990 Unspecified osteoarthritis, unspecified site: Secondary | ICD-10-CM | POA: Diagnosis not present

## 2017-10-21 DIAGNOSIS — H2511 Age-related nuclear cataract, right eye: Secondary | ICD-10-CM | POA: Diagnosis not present

## 2017-10-21 DIAGNOSIS — H25811 Combined forms of age-related cataract, right eye: Secondary | ICD-10-CM | POA: Diagnosis not present

## 2017-10-22 DIAGNOSIS — M1990 Unspecified osteoarthritis, unspecified site: Secondary | ICD-10-CM | POA: Diagnosis not present

## 2017-10-23 DIAGNOSIS — M1990 Unspecified osteoarthritis, unspecified site: Secondary | ICD-10-CM | POA: Diagnosis not present

## 2017-10-24 DIAGNOSIS — M1990 Unspecified osteoarthritis, unspecified site: Secondary | ICD-10-CM | POA: Diagnosis not present

## 2017-10-25 DIAGNOSIS — M1990 Unspecified osteoarthritis, unspecified site: Secondary | ICD-10-CM | POA: Diagnosis not present

## 2017-10-29 DIAGNOSIS — M1990 Unspecified osteoarthritis, unspecified site: Secondary | ICD-10-CM | POA: Diagnosis not present

## 2017-10-30 DIAGNOSIS — M1990 Unspecified osteoarthritis, unspecified site: Secondary | ICD-10-CM | POA: Diagnosis not present

## 2017-10-31 ENCOUNTER — Other Ambulatory Visit (INDEPENDENT_AMBULATORY_CARE_PROVIDER_SITE_OTHER): Payer: Self-pay | Admitting: Orthopaedic Surgery

## 2017-10-31 DIAGNOSIS — M1990 Unspecified osteoarthritis, unspecified site: Secondary | ICD-10-CM | POA: Diagnosis not present

## 2017-10-31 NOTE — Telephone Encounter (Signed)
Please advise 

## 2017-11-01 DIAGNOSIS — M1990 Unspecified osteoarthritis, unspecified site: Secondary | ICD-10-CM | POA: Diagnosis not present

## 2017-11-05 DIAGNOSIS — M1990 Unspecified osteoarthritis, unspecified site: Secondary | ICD-10-CM | POA: Diagnosis not present

## 2017-11-06 DIAGNOSIS — M1990 Unspecified osteoarthritis, unspecified site: Secondary | ICD-10-CM | POA: Diagnosis not present

## 2017-11-07 ENCOUNTER — Other Ambulatory Visit: Payer: Self-pay

## 2017-11-07 ENCOUNTER — Encounter: Payer: Self-pay | Admitting: Family Medicine

## 2017-11-07 ENCOUNTER — Ambulatory Visit (INDEPENDENT_AMBULATORY_CARE_PROVIDER_SITE_OTHER): Payer: Medicare HMO | Admitting: Family Medicine

## 2017-11-07 VITALS — BP 110/70 | HR 76 | Temp 98.2°F | Wt 154.0 lb

## 2017-11-07 DIAGNOSIS — F339 Major depressive disorder, recurrent, unspecified: Secondary | ICD-10-CM

## 2017-11-07 DIAGNOSIS — G629 Polyneuropathy, unspecified: Secondary | ICD-10-CM

## 2017-11-07 DIAGNOSIS — Z1231 Encounter for screening mammogram for malignant neoplasm of breast: Secondary | ICD-10-CM | POA: Diagnosis not present

## 2017-11-07 DIAGNOSIS — Z1239 Encounter for other screening for malignant neoplasm of breast: Secondary | ICD-10-CM

## 2017-11-07 DIAGNOSIS — I10 Essential (primary) hypertension: Secondary | ICD-10-CM | POA: Diagnosis not present

## 2017-11-07 DIAGNOSIS — E7800 Pure hypercholesterolemia, unspecified: Secondary | ICD-10-CM | POA: Diagnosis not present

## 2017-11-07 DIAGNOSIS — E039 Hypothyroidism, unspecified: Secondary | ICD-10-CM

## 2017-11-07 DIAGNOSIS — M1990 Unspecified osteoarthritis, unspecified site: Secondary | ICD-10-CM | POA: Diagnosis not present

## 2017-11-07 MED ORDER — GABAPENTIN 100 MG PO CAPS: 100.0000 mg | ORAL_CAPSULE | Freq: Three times a day (TID) | ORAL | 2 refills | Status: DC

## 2017-11-07 MED ORDER — AMITRIPTYLINE HCL 50 MG PO TABS: 50.0000 mg | ORAL_TABLET | Freq: Every day | ORAL | 3 refills | Status: DC

## 2017-11-07 NOTE — Assessment & Plan Note (Signed)
Repeat TSH. Continue syntroid.

## 2017-11-07 NOTE — Assessment & Plan Note (Signed)
Continue lipitor--repeat lipids

## 2017-11-07 NOTE — Patient Instructions (Signed)
DASH Eating Plan DASH stands for "Dietary Approaches to Stop Hypertension." The DASH eating plan is a healthy eating plan that has been shown to reduce high blood pressure (hypertension). It may also reduce your risk for type 2 diabetes, heart disease, and stroke. The DASH eating plan may also help with weight loss. What are tips for following this plan? General guidelines  Avoid eating more than 2,300 mg (milligrams) of salt (sodium) a day. If you have hypertension, you may need to reduce your sodium intake to 1,500 mg a day.  Limit alcohol intake to no more than 1 drink a day for nonpregnant women and 2 drinks a day for men. One drink equals 12 oz of beer, 5 oz of wine, or 1 oz of hard liquor.  Work with your health care provider to maintain a healthy body weight or to lose weight. Ask what an ideal weight is for you.  Get at least 30 minutes of exercise that causes your heart to beat faster (aerobic exercise) most days of the week. Activities may include walking, swimming, or biking.  Work with your health care provider or diet and nutrition specialist (dietitian) to adjust your eating plan to your individual calorie needs. Reading food labels  Check food labels for the amount of sodium per serving. Choose foods with less than 5 percent of the Daily Value of sodium. Generally, foods with less than 300 mg of sodium per serving fit into this eating plan.  To find whole grains, look for the word "whole" as the first word in the ingredient list. Shopping  Buy products labeled as "low-sodium" or "no salt added."  Buy fresh foods. Avoid canned foods and premade or frozen meals. Cooking  Avoid adding salt when cooking. Use salt-free seasonings or herbs instead of table salt or sea salt. Check with your health care provider or pharmacist before using salt substitutes.  Do not fry foods. Cook foods using healthy methods such as baking, boiling, grilling, and broiling instead.  Cook with  heart-healthy oils, such as olive, canola, soybean, or sunflower oil. Meal planning   Eat a balanced diet that includes: ? 5 or more servings of fruits and vegetables each day. At each meal, try to fill half of your plate with fruits and vegetables. ? Up to 6-8 servings of whole grains each day. ? Less than 6 oz of lean meat, poultry, or fish each day. A 3-oz serving of meat is about the same size as a deck of cards. One egg equals 1 oz. ? 2 servings of low-fat dairy each day. ? A serving of nuts, seeds, or beans 5 times each week. ? Heart-healthy fats. Healthy fats called Omega-3 fatty acids are found in foods such as flaxseeds and coldwater fish, like sardines, salmon, and mackerel.  Limit how much you eat of the following: ? Canned or prepackaged foods. ? Food that is high in trans fat, such as fried foods. ? Food that is high in saturated fat, such as fatty meat. ? Sweets, desserts, sugary drinks, and other foods with added sugar. ? Full-fat dairy products.  Do not salt foods before eating.  Try to eat at least 2 vegetarian meals each week.  Eat more home-cooked food and less restaurant, buffet, and fast food.  When eating at a restaurant, ask that your food be prepared with less salt or no salt, if possible. What foods are recommended? The items listed may not be a complete list. Talk with your dietitian about what   dietary choices are best for you. Grains Whole-grain or whole-wheat bread. Whole-grain or whole-wheat pasta. Brown rice. Oatmeal. Quinoa. Bulgur. Whole-grain and low-sodium cereals. Pita bread. Low-fat, low-sodium crackers. Whole-wheat flour tortillas. Vegetables Fresh or frozen vegetables (raw, steamed, roasted, or grilled). Low-sodium or reduced-sodium tomato and vegetable juice. Low-sodium or reduced-sodium tomato sauce and tomato paste. Low-sodium or reduced-sodium canned vegetables. Fruits All fresh, dried, or frozen fruit. Canned fruit in natural juice (without  added sugar). Meat and other protein foods Skinless chicken or turkey. Ground chicken or turkey. Pork with fat trimmed off. Fish and seafood. Egg whites. Dried beans, peas, or lentils. Unsalted nuts, nut butters, and seeds. Unsalted canned beans. Lean cuts of beef with fat trimmed off. Low-sodium, lean deli meat. Dairy Low-fat (1%) or fat-free (skim) milk. Fat-free, low-fat, or reduced-fat cheeses. Nonfat, low-sodium ricotta or cottage cheese. Low-fat or nonfat yogurt. Low-fat, low-sodium cheese. Fats and oils Soft margarine without trans fats. Vegetable oil. Low-fat, reduced-fat, or light mayonnaise and salad dressings (reduced-sodium). Canola, safflower, olive, soybean, and sunflower oils. Avocado. Seasoning and other foods Herbs. Spices. Seasoning mixes without salt. Unsalted popcorn and pretzels. Fat-free sweets. What foods are not recommended? The items listed may not be a complete list. Talk with your dietitian about what dietary choices are best for you. Grains Baked goods made with fat, such as croissants, muffins, or some breads. Dry pasta or rice meal packs. Vegetables Creamed or fried vegetables. Vegetables in a cheese sauce. Regular canned vegetables (not low-sodium or reduced-sodium). Regular canned tomato sauce and paste (not low-sodium or reduced-sodium). Regular tomato and vegetable juice (not low-sodium or reduced-sodium). Pickles. Olives. Fruits Canned fruit in a light or heavy syrup. Fried fruit. Fruit in cream or butter sauce. Meat and other protein foods Fatty cuts of meat. Ribs. Fried meat. Bacon. Sausage. Bologna and other processed lunch meats. Salami. Fatback. Hotdogs. Bratwurst. Salted nuts and seeds. Canned beans with added salt. Canned or smoked fish. Whole eggs or egg yolks. Chicken or turkey with skin. Dairy Whole or 2% milk, cream, and half-and-half. Whole or full-fat cream cheese. Whole-fat or sweetened yogurt. Full-fat cheese. Nondairy creamers. Whipped toppings.  Processed cheese and cheese spreads. Fats and oils Butter. Stick margarine. Lard. Shortening. Ghee. Bacon fat. Tropical oils, such as coconut, palm kernel, or palm oil. Seasoning and other foods Salted popcorn and pretzels. Onion salt, garlic salt, seasoned salt, table salt, and sea salt. Worcestershire sauce. Tartar sauce. Barbecue sauce. Teriyaki sauce. Soy sauce, including reduced-sodium. Steak sauce. Canned and packaged gravies. Fish sauce. Oyster sauce. Cocktail sauce. Horseradish that you find on the shelf. Ketchup. Mustard. Meat flavorings and tenderizers. Bouillon cubes. Hot sauce and Tabasco sauce. Premade or packaged marinades. Premade or packaged taco seasonings. Relishes. Regular salad dressings. Where to find more information:  National Heart, Lung, and Blood Institute: www.nhlbi.nih.gov  American Heart Association: www.heart.org Summary  The DASH eating plan is a healthy eating plan that has been shown to reduce high blood pressure (hypertension). It may also reduce your risk for type 2 diabetes, heart disease, and stroke.  With the DASH eating plan, you should limit salt (sodium) intake to 2,300 mg a day. If you have hypertension, you may need to reduce your sodium intake to 1,500 mg a day.  When on the DASH eating plan, aim to eat more fresh fruits and vegetables, whole grains, lean proteins, low-fat dairy, and heart-healthy fats.  Work with your health care provider or diet and nutrition specialist (dietitian) to adjust your eating plan to your individual   calorie needs. This information is not intended to replace advice given to you by your health care provider. Make sure you discuss any questions you have with your health care provider. Document Released: 05/24/2011 Document Revised: 05/28/2016 Document Reviewed: 05/28/2016 Elsevier Interactive Patient Education  2018 [employer]  

## 2017-11-07 NOTE — Assessment & Plan Note (Signed)
Bp is well controlled. Continue Lotensin/HCTZ and Norvasc

## 2017-11-07 NOTE — Progress Notes (Signed)
   Subjective:    Patient ID: Yvonne [name] is a 77 y.o. female presenting with Hypertension  on 11/07/2017  HPI: Here today for f/u. She is s/p hip replacement and doing well. She reports someone is coming daily to help her in the home. She is doing some seated exercises. Pain is much better. BP is well controlled.   Review of Systems  Constitutional: Negative for chills and fever.  Respiratory: Negative for shortness of breath.   Cardiovascular: Negative for chest pain.  Gastrointestinal: Negative for abdominal pain, nausea and vomiting.  Genitourinary: Negative for dysuria.  Skin: Negative for rash.      Objective:    BP 110/70   Pulse 76   Temp 98.2 F (36.8 C) (Oral)   Wt 154 lb (69.9 kg)   SpO2 96%   BMI 29.10 kg/m  Physical Exam  Constitutional: She is oriented to person, place, and time. She appears well-developed and well-nourished. No distress.  HENT:  Head: Normocephalic and atraumatic.  Eyes: No scleral icterus.  Neck: Neck supple.  Cardiovascular: Normal rate.  Pulmonary/Chest: Effort normal.  Abdominal: Soft.  Neurological: She is alert and oriented to person, place, and time.  Skin: Skin is warm and dry.  Psychiatric: She has a normal mood and affect.        Assessment & Plan:   Problem List Items Addressed This Visit      Unprioritized   Hypothyroidism    Repeat TSH. Continue syntroid.      Relevant Orders   TSH   Essential hypertension    Bp is well controlled. Continue Lotensin/HCTZ and Norvasc      Hypercholesteremia    Continue lipitor--repeat lipids      Relevant Orders   Lipid panel    Other Visit Diagnoses    Screening for breast cancer    -  Primary   schedule for mammogram   Relevant Orders   MM 3D SCREEN BREAST BILATERAL   Depression, recurrent (HCC)       Relevant Medications   amitriptyline (ELAVIL) 50 MG tablet   Neuropathy       Relevant Medications   gabapentin (NEURONTIN) 100 MG capsule      Total  face-to-face time with patient: 25 minutes. Over 50% of encounter was spent on counseling and coordination of care. Return in about 6 months (around 05/10/2018).  Donnamae Jude 11/07/2017 2:08 PM

## 2017-11-08 ENCOUNTER — Encounter: Payer: Self-pay | Admitting: Family Medicine

## 2017-11-08 DIAGNOSIS — M1990 Unspecified osteoarthritis, unspecified site: Secondary | ICD-10-CM | POA: Diagnosis not present

## 2017-11-08 LAB — LIPID PANEL
Chol/HDL Ratio: 2.8 ratio (ref 0.0–4.4)
Cholesterol, Total: 165 mg/dL (ref 100–199)
HDL: 59 mg/dL (ref >39)
LDL Calculated: 65 mg/dL (ref 0–99)
Triglycerides: 204 mg/dL — ABNORMAL HIGH (ref 0–149)
VLDL Cholesterol Cal: 41 mg/dL — ABNORMAL HIGH (ref 5–40)

## 2017-11-08 LAB — TSH: TSH: 2.26 u[IU]/mL (ref 0.450–4.500)

## 2017-11-12 DIAGNOSIS — M1990 Unspecified osteoarthritis, unspecified site: Secondary | ICD-10-CM | POA: Diagnosis not present

## 2017-11-13 ENCOUNTER — Encounter (INDEPENDENT_AMBULATORY_CARE_PROVIDER_SITE_OTHER): Payer: Self-pay | Admitting: Orthopaedic Surgery

## 2017-11-13 ENCOUNTER — Ambulatory Visit (INDEPENDENT_AMBULATORY_CARE_PROVIDER_SITE_OTHER): Payer: Medicare HMO | Admitting: Orthopaedic Surgery

## 2017-11-13 ENCOUNTER — Ambulatory Visit (INDEPENDENT_AMBULATORY_CARE_PROVIDER_SITE_OTHER): Payer: Medicare HMO

## 2017-11-13 DIAGNOSIS — Z96642 Presence of left artificial hip joint: Secondary | ICD-10-CM | POA: Diagnosis not present

## 2017-11-13 DIAGNOSIS — M1990 Unspecified osteoarthritis, unspecified site: Secondary | ICD-10-CM | POA: Diagnosis not present

## 2017-11-13 NOTE — Progress Notes (Signed)
The patient is now 8 months status post a left total hip arthroplasty.  She still ambulate with a walker but says she needs that for the reasons.  Should her hip is doing well does not hurt at all.  Examination of her left hip shows she has full range of motion actively passively with no pain whatsoever.  An AP pelvis shows a well-seated implant with no complicating features.  This point she will follow-up as needed since she is doing so well.  I talked her about things we need to bring her back for that hip if there is any issues with it at all.  Also we can see if anything else orthopedic wise she understands this.  All questions concerns were answered and addressed.

## 2017-11-14 DIAGNOSIS — M1990 Unspecified osteoarthritis, unspecified site: Secondary | ICD-10-CM | POA: Diagnosis not present

## 2017-11-15 DIAGNOSIS — M1990 Unspecified osteoarthritis, unspecified site: Secondary | ICD-10-CM | POA: Diagnosis not present

## 2017-11-19 DIAGNOSIS — M1990 Unspecified osteoarthritis, unspecified site: Secondary | ICD-10-CM | POA: Diagnosis not present

## 2017-11-20 DIAGNOSIS — M1990 Unspecified osteoarthritis, unspecified site: Secondary | ICD-10-CM | POA: Diagnosis not present

## 2017-11-21 ENCOUNTER — Encounter: Payer: Self-pay | Admitting: Family Medicine

## 2017-11-21 DIAGNOSIS — M1990 Unspecified osteoarthritis, unspecified site: Secondary | ICD-10-CM | POA: Diagnosis not present

## 2017-11-22 DIAGNOSIS — M1990 Unspecified osteoarthritis, unspecified site: Secondary | ICD-10-CM | POA: Diagnosis not present

## 2017-11-26 DIAGNOSIS — M1990 Unspecified osteoarthritis, unspecified site: Secondary | ICD-10-CM | POA: Diagnosis not present

## 2017-11-27 DIAGNOSIS — M1990 Unspecified osteoarthritis, unspecified site: Secondary | ICD-10-CM | POA: Diagnosis not present

## 2017-11-28 ENCOUNTER — Ambulatory Visit: Payer: Medicare HMO

## 2017-11-28 DIAGNOSIS — Z1231 Encounter for screening mammogram for malignant neoplasm of breast: Secondary | ICD-10-CM | POA: Diagnosis not present

## 2017-11-28 DIAGNOSIS — M1990 Unspecified osteoarthritis, unspecified site: Secondary | ICD-10-CM | POA: Diagnosis not present

## 2017-11-29 DIAGNOSIS — M1990 Unspecified osteoarthritis, unspecified site: Secondary | ICD-10-CM | POA: Diagnosis not present

## 2017-12-03 DIAGNOSIS — M1990 Unspecified osteoarthritis, unspecified site: Secondary | ICD-10-CM | POA: Diagnosis not present

## 2017-12-04 DIAGNOSIS — M1990 Unspecified osteoarthritis, unspecified site: Secondary | ICD-10-CM | POA: Diagnosis not present

## 2017-12-05 DIAGNOSIS — M1990 Unspecified osteoarthritis, unspecified site: Secondary | ICD-10-CM | POA: Diagnosis not present

## 2017-12-06 DIAGNOSIS — M1990 Unspecified osteoarthritis, unspecified site: Secondary | ICD-10-CM | POA: Diagnosis not present

## 2017-12-10 DIAGNOSIS — M1990 Unspecified osteoarthritis, unspecified site: Secondary | ICD-10-CM | POA: Diagnosis not present

## 2017-12-11 DIAGNOSIS — M1990 Unspecified osteoarthritis, unspecified site: Secondary | ICD-10-CM | POA: Diagnosis not present

## 2017-12-11 IMAGING — RF DG C-ARM 61-120 MIN
1 series · 3 of 3 positions shown · non-contrast
Comparison: None.

CLINICAL DATA: 75-year-old female undergoing left anterior hip
replacement

EXAM:
DG C-ARM 61-120 MIN; PORTABLE PELVIS 1-2 VIEWS

[Series 1: run · 3 of 3 slices shown]
[im 1/3]
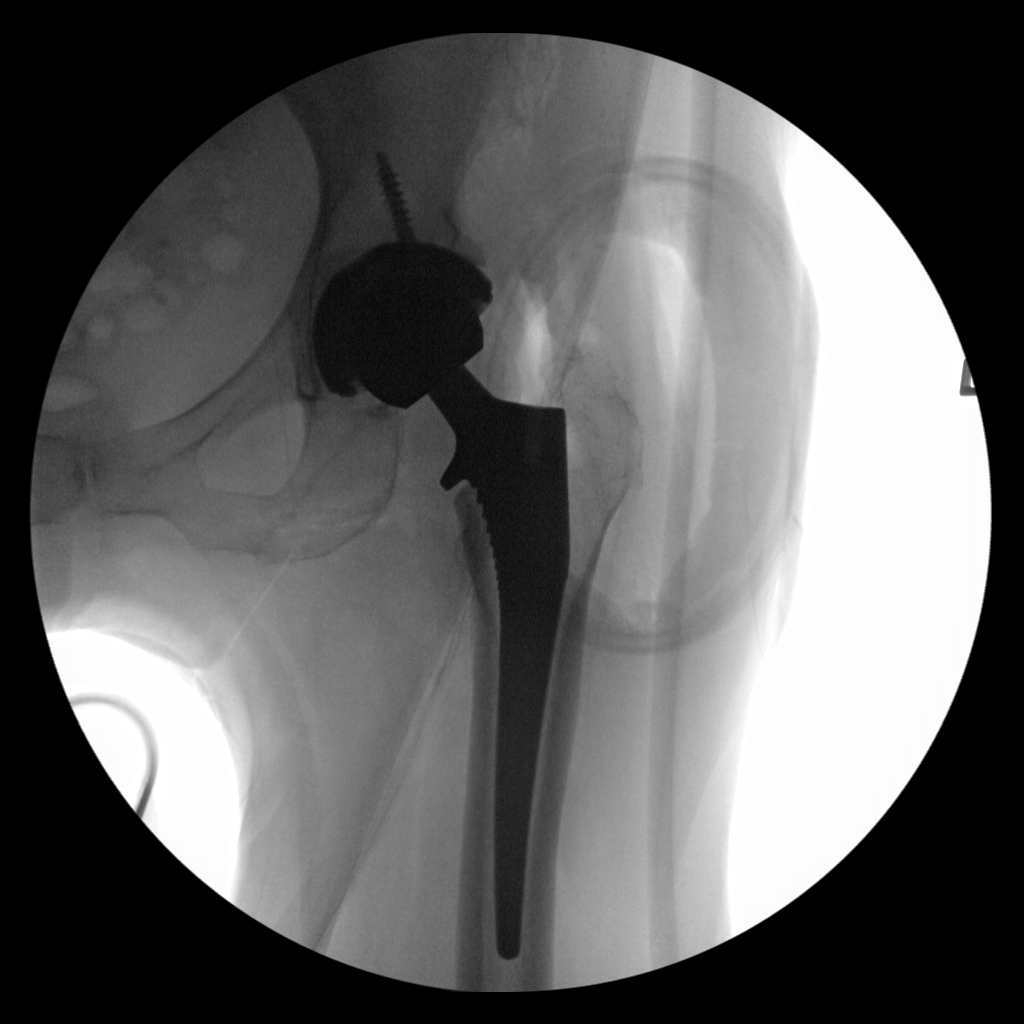
[im 2/3]
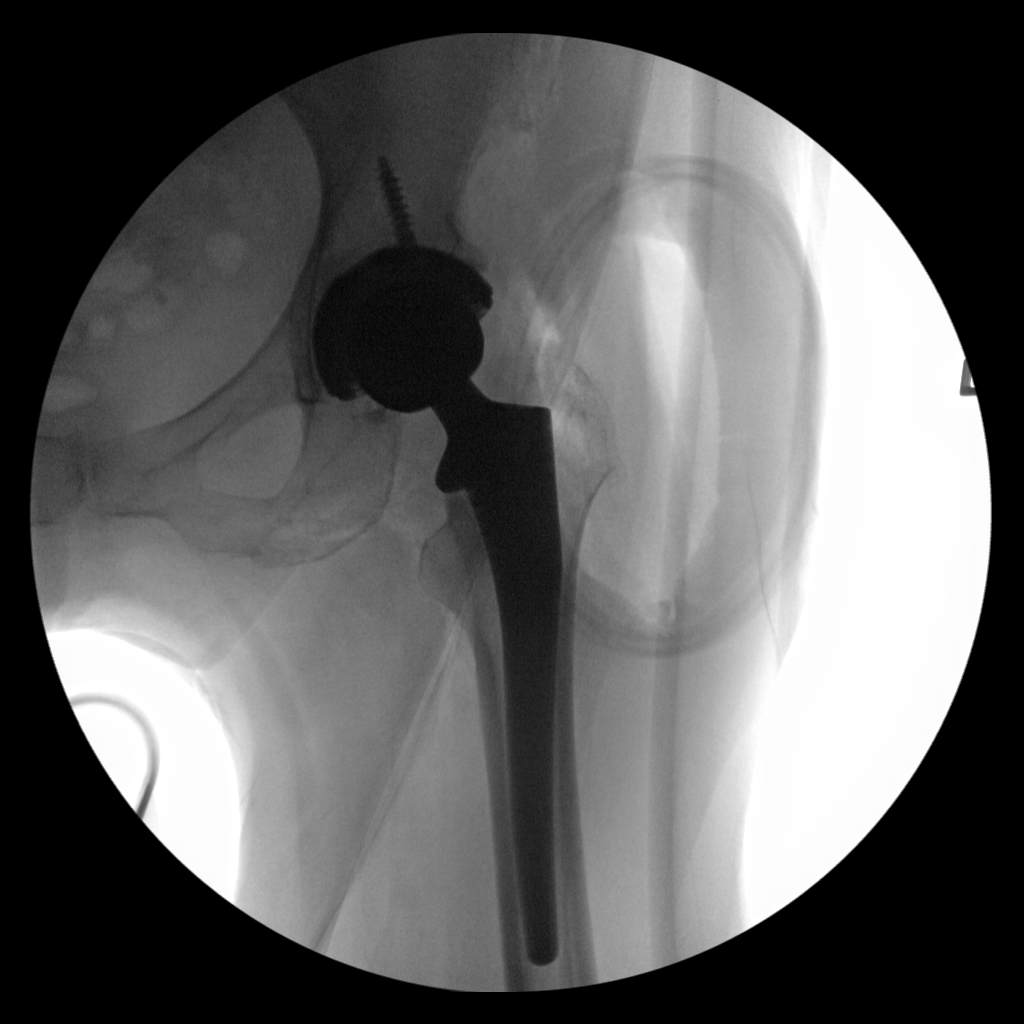
[im 3/3]
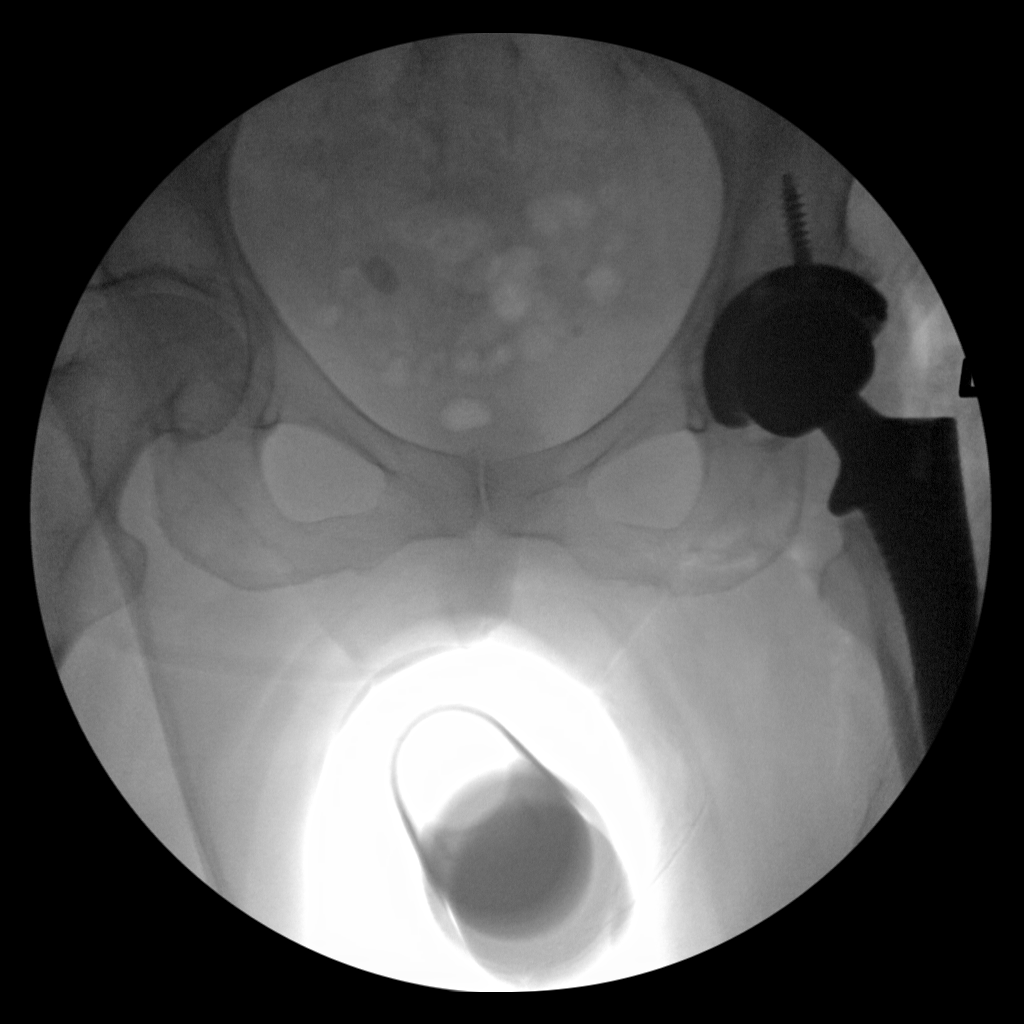

[3 of 3 positions shown; findings below may reference images not displayed]

FINDINGS: Two intraoperative radiographs demonstrate surgical changes of left
total hip arthroplasty without evidence of hardware complication.
Expected subcutaneous emphysema. Mild osteitis pubis. Phleboliths
project over the anatomic pelvis. Degenerative disc disease present
at L4-L5. The visualized bowel gas pattern is unremarkable.
IMPRESSION: Left total hip arthroplasty without evidence of immediate
complication.

## 2017-12-12 DIAGNOSIS — M1990 Unspecified osteoarthritis, unspecified site: Secondary | ICD-10-CM | POA: Diagnosis not present

## 2017-12-13 DIAGNOSIS — M1990 Unspecified osteoarthritis, unspecified site: Secondary | ICD-10-CM | POA: Diagnosis not present

## 2017-12-17 DIAGNOSIS — M1990 Unspecified osteoarthritis, unspecified site: Secondary | ICD-10-CM | POA: Diagnosis not present

## 2017-12-18 DIAGNOSIS — M1990 Unspecified osteoarthritis, unspecified site: Secondary | ICD-10-CM | POA: Diagnosis not present

## 2017-12-19 DIAGNOSIS — M1990 Unspecified osteoarthritis, unspecified site: Secondary | ICD-10-CM | POA: Diagnosis not present

## 2017-12-20 DIAGNOSIS — M1990 Unspecified osteoarthritis, unspecified site: Secondary | ICD-10-CM | POA: Diagnosis not present

## 2017-12-24 DIAGNOSIS — M1990 Unspecified osteoarthritis, unspecified site: Secondary | ICD-10-CM | POA: Diagnosis not present

## 2017-12-25 DIAGNOSIS — M1990 Unspecified osteoarthritis, unspecified site: Secondary | ICD-10-CM | POA: Diagnosis not present

## 2017-12-26 DIAGNOSIS — M1990 Unspecified osteoarthritis, unspecified site: Secondary | ICD-10-CM | POA: Diagnosis not present

## 2017-12-27 DIAGNOSIS — M1990 Unspecified osteoarthritis, unspecified site: Secondary | ICD-10-CM | POA: Diagnosis not present

## 2017-12-31 DIAGNOSIS — M1990 Unspecified osteoarthritis, unspecified site: Secondary | ICD-10-CM | POA: Diagnosis not present

## 2018-01-01 DIAGNOSIS — M1990 Unspecified osteoarthritis, unspecified site: Secondary | ICD-10-CM | POA: Diagnosis not present

## 2018-01-02 DIAGNOSIS — M1990 Unspecified osteoarthritis, unspecified site: Secondary | ICD-10-CM | POA: Diagnosis not present

## 2018-01-03 DIAGNOSIS — M1990 Unspecified osteoarthritis, unspecified site: Secondary | ICD-10-CM | POA: Diagnosis not present

## 2018-01-07 DIAGNOSIS — M1990 Unspecified osteoarthritis, unspecified site: Secondary | ICD-10-CM | POA: Diagnosis not present

## 2018-01-08 DIAGNOSIS — M1990 Unspecified osteoarthritis, unspecified site: Secondary | ICD-10-CM | POA: Diagnosis not present

## 2018-01-09 DIAGNOSIS — M1990 Unspecified osteoarthritis, unspecified site: Secondary | ICD-10-CM | POA: Diagnosis not present

## 2018-01-10 DIAGNOSIS — M1990 Unspecified osteoarthritis, unspecified site: Secondary | ICD-10-CM | POA: Diagnosis not present

## 2018-01-14 DIAGNOSIS — M1990 Unspecified osteoarthritis, unspecified site: Secondary | ICD-10-CM | POA: Diagnosis not present

## 2018-01-15 DIAGNOSIS — M1990 Unspecified osteoarthritis, unspecified site: Secondary | ICD-10-CM | POA: Diagnosis not present

## 2018-01-16 DIAGNOSIS — M1990 Unspecified osteoarthritis, unspecified site: Secondary | ICD-10-CM | POA: Diagnosis not present

## 2018-01-17 DIAGNOSIS — M1990 Unspecified osteoarthritis, unspecified site: Secondary | ICD-10-CM | POA: Diagnosis not present

## 2018-01-21 DIAGNOSIS — M1990 Unspecified osteoarthritis, unspecified site: Secondary | ICD-10-CM | POA: Diagnosis not present

## 2018-01-22 DIAGNOSIS — M1990 Unspecified osteoarthritis, unspecified site: Secondary | ICD-10-CM | POA: Diagnosis not present

## 2018-01-23 DIAGNOSIS — M1990 Unspecified osteoarthritis, unspecified site: Secondary | ICD-10-CM | POA: Diagnosis not present

## 2018-01-24 DIAGNOSIS — M1990 Unspecified osteoarthritis, unspecified site: Secondary | ICD-10-CM | POA: Diagnosis not present

## 2018-01-28 DIAGNOSIS — M1990 Unspecified osteoarthritis, unspecified site: Secondary | ICD-10-CM | POA: Diagnosis not present

## 2018-01-29 DIAGNOSIS — M1990 Unspecified osteoarthritis, unspecified site: Secondary | ICD-10-CM | POA: Diagnosis not present

## 2018-01-30 DIAGNOSIS — M1990 Unspecified osteoarthritis, unspecified site: Secondary | ICD-10-CM | POA: Diagnosis not present

## 2018-01-31 DIAGNOSIS — M1990 Unspecified osteoarthritis, unspecified site: Secondary | ICD-10-CM | POA: Diagnosis not present

## 2018-02-04 DIAGNOSIS — M1990 Unspecified osteoarthritis, unspecified site: Secondary | ICD-10-CM | POA: Diagnosis not present

## 2018-02-05 DIAGNOSIS — M1990 Unspecified osteoarthritis, unspecified site: Secondary | ICD-10-CM | POA: Diagnosis not present

## 2018-02-06 ENCOUNTER — Telehealth: Payer: Self-pay

## 2018-02-06 DIAGNOSIS — M1990 Unspecified osteoarthritis, unspecified site: Secondary | ICD-10-CM | POA: Diagnosis not present

## 2018-02-06 NOTE — Telephone Encounter (Signed)
Received fax from [employer] requesting prior authorization of Elavil.    Clinical information submitted via CoverMyMeds.Status pending. May take up to 5 business days.  Key Beckwourth, RN [facility] RN)

## 2018-02-07 DIAGNOSIS — M1990 Unspecified osteoarthritis, unspecified site: Secondary | ICD-10-CM | POA: Diagnosis not present

## 2018-02-07 NOTE — Telephone Encounter (Signed)
PA approved 02/06/18 - 02/06/20. [facility] pharmacy notified. Danley Danker, RN [facility] RN)

## 2018-02-10 ENCOUNTER — Ambulatory Visit (INDEPENDENT_AMBULATORY_CARE_PROVIDER_SITE_OTHER): Payer: Medicare HMO | Admitting: Cardiovascular Disease

## 2018-02-10 ENCOUNTER — Encounter: Payer: Self-pay | Admitting: Cardiovascular Disease

## 2018-02-10 VITALS — BP 144/82 | HR 77 | Ht 61.0 in | Wt 143.0 lb

## 2018-02-10 DIAGNOSIS — I10 Essential (primary) hypertension: Secondary | ICD-10-CM | POA: Diagnosis not present

## 2018-02-10 DIAGNOSIS — I739 Peripheral vascular disease, unspecified: Secondary | ICD-10-CM

## 2018-02-10 DIAGNOSIS — I779 Disorder of arteries and arterioles, unspecified: Secondary | ICD-10-CM | POA: Diagnosis not present

## 2018-02-10 MED ORDER — BENAZEPRIL HCL 40 MG PO TABS: 40.0000 mg | ORAL_TABLET | Freq: Every day | ORAL | 3 refills | Status: DC

## 2018-02-10 MED ORDER — HYDROCHLOROTHIAZIDE 25 MG PO TABS: 25.0000 mg | ORAL_TABLET | Freq: Every day | ORAL | 3 refills | Status: DC

## 2018-02-10 NOTE — Progress Notes (Signed)
Yvonne [name] Date of Birth  30-Aug-1940 [place] [address]    [place]   [place], [place]  [address]    [place], [place]  [address] [phone]  Fax  [phone]  [phone]  Fax [phone]   Problem List: 1. Mild AS / AI  2. Hypothyroidism 3. Hypertension 4. Toe amputation for hammer toe 5. History of stroke- occluded left carotid, mild right carotid disease 6. CVA - age 77      Pt is a 77 y.o. female with a hx of stroke at age 47.  She continues to have problems with her right arm.  She has had a heart murmur for years.  She has not had any recent problems.  She has a hx of HTN.  She admits to eating extra salt on occasion.  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 This corresponds with her previous stroke. There was no significant disease on right carotid. An echocardiogram revealed normal left ventricular systolic function.  She has mild aortic stenosis and aortic insufficiency which explains her heart murmur.  She eats lots of salty meals.  She eats prepared lunch and dinners every day.  She eats popcorn every night.    December 08, 2014:    Yvonne [name] is seen back after a 3 year absence.  Still eats some salt .  She is doing well.  She fell the other day.   misstepped on the cement.    December 12, 2015:  Doing well. Is now walking with a walker  Doing well from a cardiac standpoint .  Aug. 20, 2018  [name] is seen today  Is walking with a walker for the past year BP looks good today    Aug. 26, 2019:  [name] is seen today  Doing well Hx of a stroke at age 68.  Has HTN,  Still eats some some salt.     Current Outpatient Medications on File Prior to Visit  Medication Sig Dispense Refill  . amitriptyline (ELAVIL) 50 MG tablet Take 1 tablet (50 mg total) by mouth at bedtime. 90 tablet 3  . amLODipine (NORVASC) 5 MG tablet Take 1 tablet (5 mg total)  daily by mouth. 90 tablet 3  . aspirin 81 MG chewable tablet Chew 81 mg by mouth 2 (two) times daily.    Marland Kitchen atorvastatin (LIPITOR) 10 MG tablet Take 1 tablet (10 mg total) daily by mouth. 90 tablet 3  . benazepril-hydrochlorthiazide (LOTENSIN HCT) 20-25 MG tablet Take 1 tablet daily by mouth. 90 tablet 3  . gabapentin (NEURONTIN) 100 MG capsule Take 1 capsule (100 mg total) by mouth 3 (three) times daily. 270 capsule 2  . levothyroxine (SYNTHROID, LEVOTHROID) 75 MCG tablet Take 1 tablet (75 mcg total) daily by mouth. 90 tablet 3  . lidocaine-prilocaine (EMLA) cream Apply up to 2 grams three times a day as needed for pain. 30 g 0  . Multiple Vitamin (MULTIVITAMIN WITH MINERALS) TABS tablet Take 1 tablet by mouth daily with breakfast.    . omega-3 acid ethyl esters (LOVAZA) 1 g capsule TAKE 1 CAPSULE TWICE DAILY 180 capsule 3   No current facility-administered medications on file prior to visit.     No Known Allergies  Past Medical History:  Diagnosis Date  . Allergy   . Anxiety   . Aortic stenosis    mild by 07/2011 echo   .  Arthritis   . Heart murmur   . Hypertension   . Hypothyroidism   . Impaired functional mobility, balance, gait, and endurance 09/02/2015  . Mild cognitive impairment with memory loss 09/02/2015  . Stroke [facility])    pt was 40  . Thyroid disease     Past Surgical History:  Procedure Laterality Date  . ABDOMINAL HYSTERECTOMY    . BRAIN SURGERY     age 20's  . TOE AMPUTATION  2013   2nd toe on right foot, hammer toe  . TONSILLECTOMY    . TOTAL HIP ARTHROPLASTY Left 03/19/2017   Procedure: LEFT TOTAL HIP ARTHROPLASTY ANTERIOR APPROACH;  Surgeon: Mcarthur Rossetti, MD;  Location: [place];  Service: Orthopedics;  Laterality: Left;    Social History   Tobacco Use  Smoking Status Never Smoker  Smokeless Tobacco Never Used    Social History   Substance and Sexual Activity  Alcohol Use No    Family History  Problem Relation Age of Onset  . Thyroid  disease Mother   . Heart disease Father     Reviw of Systems:  Noted is current hx   Physical Exam: Blood pressure (!) 144/82, pulse 77, height 5\' 1"  (1.549 m), weight 143 lb (64.9 kg), SpO2 95 %.  GEN:   Elderly female,    HEENT: Normal,  disconjugate gaze  NECK: No JVD; No carotid bruits LYMPHATICS: No lymphadenopathy CARDIAC: RR , 4-3/3 systolic murmur - radiates t right upper sternal border and to axilla  RESPIRATORY:  Clear to auscultation without rales, wheezing or rhonchi  ABDOMEN: Soft, non-tender, non-distended MUSCULOSKELETAL:  No edema; No deformity  SKIN: Warm and dry NEUROLOGIC:  Alert and oriented x 3    ECG:  Aug. 26 2019:   NSR 77,  Inc. RBBB, LAFB.  Marland Kitchen    Assessment / Plan:   1. Mild AS / AI  -      2. Hypothyroidism - followed by family practice.   3. Hypertension -blood pressure slightly elevated.  I would like to increase the benazepril to 40 mg a day and then a separate prescription for HCTZ 25 mg a day.  We will check a basic metabolic profile in 3 weeks.  4. Toe amputation for hammer toe   5. History of stroke- occluded left carotid, mild right carotid disease Will repeat carotid duplex scan.  Will see Gae Bon in 6 months and will see me in 1 year     Mertie Moores, MD  02/10/2018 1:35 PM    [address],  [place], [place]  [address] Pager [phone] Phone: [phone]; Fax: [phone]

## 2018-02-10 NOTE — Patient Instructions (Addendum)
Medication Instructions:  Your physician has recommended you make the following change in your medication:   INCREASE Lotensin (Benazepril) to 40 mg once daily INCREASE HCTZ (Hydrochlorthiazide) to 25 mg once daily   Labwork: Your physician recommends that you return for lab work in: 3 weeks for basic metabolic panel on Tuesday Sept. 10. You may come in anytime between 7:30 am and 4:30 pm, you do not have to fast   Testing/Procedures: None Ordered    Follow-Up: Your physician wants you to follow-up in: 6 months with Pecolia Ades, NP or another member of Dr. Elmarie Shiley office.  You will receive a reminder letter in the mail two months in advance. If you don't receive a letter, please call our office to schedule the follow-up appointment.   If you need a refill on your cardiac medications before your next appointment, please call your pharmacy.   Thank you for choosing [facility]! Christen Bame, RN [phone]

## 2018-02-11 DIAGNOSIS — M1990 Unspecified osteoarthritis, unspecified site: Secondary | ICD-10-CM | POA: Diagnosis not present

## 2018-02-12 DIAGNOSIS — M1990 Unspecified osteoarthritis, unspecified site: Secondary | ICD-10-CM | POA: Diagnosis not present

## 2018-02-13 DIAGNOSIS — M1990 Unspecified osteoarthritis, unspecified site: Secondary | ICD-10-CM | POA: Diagnosis not present

## 2018-02-14 DIAGNOSIS — M1990 Unspecified osteoarthritis, unspecified site: Secondary | ICD-10-CM | POA: Diagnosis not present

## 2018-02-18 DIAGNOSIS — M1990 Unspecified osteoarthritis, unspecified site: Secondary | ICD-10-CM | POA: Diagnosis not present

## 2018-02-19 DIAGNOSIS — M1990 Unspecified osteoarthritis, unspecified site: Secondary | ICD-10-CM | POA: Diagnosis not present

## 2018-02-20 DIAGNOSIS — M1990 Unspecified osteoarthritis, unspecified site: Secondary | ICD-10-CM | POA: Diagnosis not present

## 2018-02-21 ENCOUNTER — Encounter: Payer: Self-pay | Admitting: Podiatry

## 2018-02-21 DIAGNOSIS — M1990 Unspecified osteoarthritis, unspecified site: Secondary | ICD-10-CM | POA: Diagnosis not present

## 2018-02-21 NOTE — Progress Notes (Signed)
Medical records requested by [facility] for CMS Medicare Risk Adjustment Review was uploaded to the [facility] portal. Reference Number [id number].

## 2018-02-27 ENCOUNTER — Telehealth: Payer: Self-pay | Admitting: Family Medicine

## 2018-02-27 NOTE — Telephone Encounter (Signed)
Will forward to MD to place order for ENT for wax impaction.  Jazmin Hartsell,CMA

## 2018-02-27 NOTE — Telephone Encounter (Signed)
This pt went to a place called [facility] to get fitted for hearing aids and they could not fit/give her hearing aids due to the wax build up in her ear and that she would need to go to a ear specialist to get the wax removed. Pt would like a referral as soon a possible to get the wax removed so she can go back and get hearing aids. Please call Jonni Sanger at [phone] once this referral has been placed. Please advise

## 2018-02-28 NOTE — Telephone Encounter (Signed)
Tried to call patient but there was no answer.  Will continue to try and reach her to schedule this appointment.  Averil Digman,CMA

## 2018-03-03 NOTE — Telephone Encounter (Signed)
Tried to contact patient again with no answer or voicemail.  Also tried to call Jonni Sanger and was unable to reach him.  Will just inform patient of this when she calls back.  Nevaen Tredway,CMA

## 2018-03-04 ENCOUNTER — Other Ambulatory Visit: Payer: Medicare HMO

## 2018-03-04 DIAGNOSIS — I10 Essential (primary) hypertension: Secondary | ICD-10-CM

## 2018-03-04 DIAGNOSIS — I739 Peripheral vascular disease, unspecified: Secondary | ICD-10-CM

## 2018-03-04 DIAGNOSIS — I779 Disorder of arteries and arterioles, unspecified: Secondary | ICD-10-CM

## 2018-03-04 DIAGNOSIS — M1990 Unspecified osteoarthritis, unspecified site: Secondary | ICD-10-CM | POA: Diagnosis not present

## 2018-03-05 DIAGNOSIS — M1990 Unspecified osteoarthritis, unspecified site: Secondary | ICD-10-CM | POA: Diagnosis not present

## 2018-03-05 LAB — BASIC METABOLIC PANEL
BUN / CREAT RATIO: 16 (ref 12–28)
BUN: 10 mg/dL (ref 8–27)
CO2: 24 mmol/L (ref 20–29)
Calcium: 9.4 mg/dL (ref 8.7–10.3)
Chloride: 101 mmol/L (ref 96–106)
Creatinine, Ser: 0.64 mg/dL (ref 0.57–1.00)
GFR calc Af Amer: 99 mL/min/{1.73_m2} (ref >59)
GFR calc non Af Amer: 86 mL/min/{1.73_m2} (ref >59)
Glucose: 116 mg/dL — ABNORMAL HIGH (ref 65–99)
POTASSIUM: 3.9 mmol/L (ref 3.5–5.2)
SODIUM: 141 mmol/L (ref 134–144)

## 2018-03-06 ENCOUNTER — Telehealth: Payer: Self-pay | Admitting: Cardiovascular Disease

## 2018-03-06 DIAGNOSIS — M1990 Unspecified osteoarthritis, unspecified site: Secondary | ICD-10-CM | POA: Diagnosis not present

## 2018-03-06 NOTE — Telephone Encounter (Signed)
New Message ° ° ° °Patient is returning call in reference to lab results. Please call.  °

## 2018-03-06 NOTE — Telephone Encounter (Signed)
Spoke with patient and informed her of lab results.

## 2018-03-07 DIAGNOSIS — M1990 Unspecified osteoarthritis, unspecified site: Secondary | ICD-10-CM | POA: Diagnosis not present

## 2018-03-11 DIAGNOSIS — M1990 Unspecified osteoarthritis, unspecified site: Secondary | ICD-10-CM | POA: Diagnosis not present

## 2018-03-12 DIAGNOSIS — M1990 Unspecified osteoarthritis, unspecified site: Secondary | ICD-10-CM | POA: Diagnosis not present

## 2018-03-13 DIAGNOSIS — M1990 Unspecified osteoarthritis, unspecified site: Secondary | ICD-10-CM | POA: Diagnosis not present

## 2018-03-14 DIAGNOSIS — M1990 Unspecified osteoarthritis, unspecified site: Secondary | ICD-10-CM | POA: Diagnosis not present

## 2018-03-17 ENCOUNTER — Encounter: Payer: Self-pay | Admitting: Nurse Practitioner

## 2018-03-18 DIAGNOSIS — H6123 Impacted cerumen, bilateral: Secondary | ICD-10-CM | POA: Diagnosis not present

## 2018-03-18 DIAGNOSIS — H903 Sensorineural hearing loss, bilateral: Secondary | ICD-10-CM | POA: Diagnosis not present

## 2018-03-18 DIAGNOSIS — M1990 Unspecified osteoarthritis, unspecified site: Secondary | ICD-10-CM | POA: Diagnosis not present

## 2018-03-19 DIAGNOSIS — M1990 Unspecified osteoarthritis, unspecified site: Secondary | ICD-10-CM | POA: Diagnosis not present

## 2018-03-20 DIAGNOSIS — M1990 Unspecified osteoarthritis, unspecified site: Secondary | ICD-10-CM | POA: Diagnosis not present

## 2018-03-21 DIAGNOSIS — M1990 Unspecified osteoarthritis, unspecified site: Secondary | ICD-10-CM | POA: Diagnosis not present

## 2018-03-25 DIAGNOSIS — M1990 Unspecified osteoarthritis, unspecified site: Secondary | ICD-10-CM | POA: Diagnosis not present

## 2018-03-26 DIAGNOSIS — M1990 Unspecified osteoarthritis, unspecified site: Secondary | ICD-10-CM | POA: Diagnosis not present

## 2018-03-27 ENCOUNTER — Ambulatory Visit (INDEPENDENT_AMBULATORY_CARE_PROVIDER_SITE_OTHER): Payer: Medicare HMO | Admitting: Family Medicine

## 2018-03-27 ENCOUNTER — Other Ambulatory Visit: Payer: Self-pay

## 2018-03-27 ENCOUNTER — Encounter: Payer: Self-pay | Admitting: Family Medicine

## 2018-03-27 VITALS — BP 124/80 | HR 70 | Temp 97.7°F | Wt 148.4 lb

## 2018-03-27 DIAGNOSIS — E7800 Pure hypercholesterolemia, unspecified: Secondary | ICD-10-CM | POA: Diagnosis not present

## 2018-03-27 DIAGNOSIS — I10 Essential (primary) hypertension: Secondary | ICD-10-CM | POA: Diagnosis not present

## 2018-03-27 DIAGNOSIS — Z8673 Personal history of transient ischemic attack (TIA), and cerebral infarction without residual deficits: Secondary | ICD-10-CM | POA: Diagnosis not present

## 2018-03-27 DIAGNOSIS — M1990 Unspecified osteoarthritis, unspecified site: Secondary | ICD-10-CM | POA: Diagnosis not present

## 2018-03-27 DIAGNOSIS — Z7189 Other specified counseling: Secondary | ICD-10-CM

## 2018-03-27 DIAGNOSIS — E038 Other specified hypothyroidism: Secondary | ICD-10-CM

## 2018-03-27 DIAGNOSIS — H903 Sensorineural hearing loss, bilateral: Secondary | ICD-10-CM | POA: Diagnosis not present

## 2018-03-27 DIAGNOSIS — I6523 Occlusion and stenosis of bilateral carotid arteries: Secondary | ICD-10-CM

## 2018-03-27 DIAGNOSIS — I062 Rheumatic aortic stenosis with insufficiency: Secondary | ICD-10-CM

## 2018-03-27 DIAGNOSIS — R200 Anesthesia of skin: Secondary | ICD-10-CM | POA: Diagnosis not present

## 2018-03-27 DIAGNOSIS — M1612 Unilateral primary osteoarthritis, left hip: Secondary | ICD-10-CM | POA: Diagnosis not present

## 2018-03-27 LAB — POCT GLYCOSYLATED HEMOGLOBIN (HGB A1C): Hemoglobin A1C: 6.2 % — AB (ref 4.0–5.6)

## 2018-03-27 MED ORDER — LIDOCAINE-PRILOCAINE 2.5-2.5 % EX CREA: TOPICAL_CREAM | CUTANEOUS | 3 refills | Status: DC

## 2018-03-27 MED ORDER — LEVOTHYROXINE SODIUM 75 MCG PO TABS: 75.0000 ug | ORAL_TABLET | Freq: Every day | ORAL | 3 refills | Status: DC

## 2018-03-27 MED ORDER — ATORVASTATIN CALCIUM 10 MG PO TABS: 10.0000 mg | ORAL_TABLET | Freq: Every day | ORAL | 3 refills | Status: DC

## 2018-03-27 MED ORDER — AMLODIPINE BESYLATE 5 MG PO TABS: 5.0000 mg | ORAL_TABLET | Freq: Every day | ORAL | 3 refills | Status: DC

## 2018-03-27 NOTE — Progress Notes (Signed)
Subjective:    Patient ID: Yvonne [name] is a 77 y.o. female presenting with Medication Refill  on 03/27/2018  HPI: Saw Cards who increased her BP meds. Tolerated this well. Walking is not improving. She is falling at home monthly. Has Neuropathy with numbness in feet without a definitive cause and notes, that she has to stop and rest. 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 Has had progressive numbness in her legs/feet in last 1-2 years. Has been on Neurontin.  Has normal B12, negative RPR, is not known to be diabetic. She has an aide coming in daily.We did a CT of her head to determine if her gait was related to some central nervous system issue. The CT was stable. She is not a candidate for MRI due to metal in her head. Has remote h/o stroke, but walking fine until last two years. She is s/p hip replacement and had rehab and PT, + stay in SNF. I had tried to order an MRI of her spine to look for  Had flu shot 1 month ago and had pain. That has since resolved. Getting new hearing aids.  Review of Systems  Constitutional: Negative for chills and fever.  Respiratory: Negative for shortness of breath.   Cardiovascular: Negative for chest pain.  Gastrointestinal: Negative for abdominal pain, nausea and vomiting.  Genitourinary: Negative for dysuria.  Skin: Negative for rash.      Objective:    BP 124/80 (BP Location: Right Arm)   Pulse 70   Temp 97.7 F (36.5 C) (Oral)   Wt 148 lb 6.4 oz (67.3 kg)   SpO2 94%   BMI 28.04 kg/m  Physical Exam  Constitutional: She is oriented to person, place, and time. She appears well-developed and well-nourished. No distress.  HENT:  Head: Normocephalic and atraumatic.  Eyes: No scleral icterus.  Neck: Neck supple.  Cardiovascular: Normal rate.  Pulmonary/Chest: Effort normal.  Abdominal: Soft.  Neurological: She is alert and oriented to person, place, and time.  Skin: Skin is warm and dry.  Psychiatric: She has a  normal mood and affect.        Assessment & Plan:   Problem List Items Addressed This Visit      Unprioritized   Hypothyroidism    Continue Synthroid      Relevant Medications   levothyroxine (SYNTHROID, LEVOTHROID) 75 MCG tablet   Essential hypertension    BP is well controlled. Continue Lotensin and HCTZ and Norvasc.      Relevant Medications   amLODipine (NORVASC) 5 MG tablet   atorvastatin (LIPITOR) 10 MG tablet   Aortic valve stenosis and insufficiency, rheumatic    Has f/u with cards, yearly      Relevant Medications   amLODipine (NORVASC) 5 MG tablet   atorvastatin (LIPITOR) 10 MG tablet   History of cerebrovascular accident    Continue Lovaza and Lipitor      Osteoarthritis    EMLA cream.      Relevant Medications   lidocaine-prilocaine (EMLA) cream   Hypercholesteremia    Continue Lipitor      Relevant Medications   amLODipine (NORVASC) 5 MG tablet   atorvastatin (LIPITOR) 10 MG tablet   Numbness in both legs - Primary    Attempted Neurology referral last year, but they did not get patient on phone. She states may leave message and she will return call. Repeat Hgb A1C.      Relevant Orders  POCT HgB A1C (Completed)   Ambulatory referral to Neurology   Hearing impairment    Has seen audiology and getting new hearing aids.      Bilateral carotid artery disease (HCC)    Years carotid doppler studies--h/o CVA previously      Relevant Medications   amLODipine (NORVASC) 5 MG tablet   atorvastatin (LIPITOR) 10 MG tablet    Other Visit Diagnoses    History of stroke       Relevant Medications   atorvastatin (LIPITOR) 10 MG tablet   DNR (do not resuscitate) discussion       Has intimated she would not like to be resuscitated on several occasions, including today!     Total face-to-face time with patient: 25 minutes. Over 50% of encounter was spent on counseling and coordination of care. Return in about 4 months (around 07/28/2018) for a  follow-up.  Donnamae Jude 03/27/2018 1:42 PM

## 2018-03-27 NOTE — Patient Instructions (Signed)
Peripheral Neuropathy Peripheral neuropathy is a type of nerve damage. It affects nerves that carry signals between the spinal cord and other parts of the body. These are called peripheral nerves. With peripheral neuropathy, one nerve or a group of nerves may be damaged. What are the causes? Many things can damage peripheral nerves. For some people with peripheral neuropathy, the cause is unknown. Some causes include:  Diabetes. This is the most common cause of peripheral neuropathy.  Injury to a nerve.  Pressure or stress on a nerve that lasts a long time.  Too little vitamin B. Alcoholism can lead to this.  Infections.  Autoimmune diseases, such as multiple sclerosis and systemic lupus erythematosus.  Inherited nerve diseases.  Some medicines, such as cancer drugs.  Toxic substances, such as lead and mercury.  Too little blood flowing to the legs.  Kidney disease.  Thyroid disease.  What are the signs or symptoms? Different people have different symptoms. The symptoms you have will depend on which of your nerves is damaged. Common symptoms include:  Loss of feeling (numbness) in the feet and hands.  Tingling in the feet and hands.  Pain that burns.  Very sensitive skin.  Weakness.  Not being able to move a part of the body (paralysis).  Muscle twitching.  Clumsiness or poor coordination.  Loss of balance.  Not being able to control your bladder.  Feeling dizzy.  Sexual problems.  How is this diagnosed? Peripheral neuropathy is a symptom, not a disease. Finding the cause of peripheral neuropathy can be hard. To figure that out, your health care provider will take a medical history and do a physical exam. A neurological exam will also be done. This involves checking things affected by your brain, spinal cord, and nerves (nervous system). For example, your health care provider will check your reflexes, how you move, and what you can feel. Other types of tests  may also be ordered, such as:  Blood tests.  A test of the fluid in your spinal cord.  Imaging tests, such as CT scans or an MRI.  Electromyography (EMG). This test checks the nerves that control muscles.  Nerve conduction velocity tests. These tests check how fast messages pass through your nerves.  Nerve biopsy. A small piece of nerve is removed. It is then checked under a microscope.  How is this treated?  Medicine is often used to treat peripheral neuropathy. Medicines may include: ? Pain-relieving medicines. Prescription or over-the-counter medicine may be suggested. ? Antiseizure medicine. This may be used for pain. ? Antidepressants. These also may help ease pain from neuropathy. ? Lidocaine. This is a numbing medicine. You might wear a patch or be given a shot. ? Mexiletine. This medicine is typically used to help control irregular heart rhythms.  Surgery. Surgery may be needed to relieve pressure on a nerve or to destroy a nerve that is causing pain.  Physical therapy to help movement.  Assistive devices to help movement. Follow these instructions at home:  Only take over-the-counter or prescription medicines as directed by your health care provider. Follow the instructions carefully for any given medicines. Do not take any other medicines without first getting approval from your health care provider.  If you have diabetes, work closely with your health care provider to keep your blood sugar under control.  If you have numbness in your feet: ? Check every day for signs of injury or infection. Watch for redness, warmth, and swelling. ? Wear padded socks and comfortable   shoes. These help protect your feet.  Do not do things that put pressure on your damaged nerve.  Do not smoke. Smoking keeps blood from getting to damaged nerves.  Avoid or limit alcohol. Too much alcohol can cause a lack of B vitamins. These vitamins are needed for healthy nerves.  Develop a good  support system. Coping with peripheral neuropathy can be stressful. Talk to a mental health specialist or join a support group if you are struggling.  Follow up with your health care provider as directed. Contact a health care provider if:  You have new signs or symptoms of peripheral neuropathy.  You are struggling emotionally from dealing with peripheral neuropathy.  You have a fever. Get help right away if:  You have an injury or infection that is not healing.  You feel very dizzy or begin vomiting.  You have chest pain.  You have trouble breathing. This information is not intended to replace advice given to you by your health care provider. Make sure you discuss any questions you have with your health care provider. Document Released: 05/25/2002 Document Revised: 11/10/2015 Document Reviewed: 02/09/2013 Elsevier Interactive Patient Education  2017 Elsevier Inc.  

## 2018-03-28 ENCOUNTER — Encounter: Payer: Self-pay | Admitting: Family Medicine

## 2018-03-28 DIAGNOSIS — M1990 Unspecified osteoarthritis, unspecified site: Secondary | ICD-10-CM | POA: Diagnosis not present

## 2018-03-28 NOTE — Assessment & Plan Note (Signed)
Has f/u with cards, yearly

## 2018-03-28 NOTE — Assessment & Plan Note (Signed)
Continue Synthroid °

## 2018-03-28 NOTE — Assessment & Plan Note (Signed)
BP is well controlled. Continue Lotensin and HCTZ and Norvasc.

## 2018-03-28 NOTE — Assessment & Plan Note (Addendum)
Attempted Neurology referral last year, but they did not get patient on phone. She states may leave message and she will return call. Repeat Hgb A1C.

## 2018-03-28 NOTE — Assessment & Plan Note (Signed)
Years carotid doppler studies--h/o CVA previously

## 2018-03-28 NOTE — Assessment & Plan Note (Signed)
EMLA cream

## 2018-03-28 NOTE — Assessment & Plan Note (Signed)
Continue Lovaza and Lipitor

## 2018-03-28 NOTE — Assessment & Plan Note (Signed)
Has seen audiology and getting new hearing aids.

## 2018-03-28 NOTE — Assessment & Plan Note (Signed)
-  Continue Lipitor °

## 2018-03-31 ENCOUNTER — Encounter: Payer: Self-pay | Admitting: Neurology

## 2018-03-31 ENCOUNTER — Ambulatory Visit (INDEPENDENT_AMBULATORY_CARE_PROVIDER_SITE_OTHER): Payer: Medicare HMO | Admitting: Neurology

## 2018-03-31 VITALS — BP 140/70 | HR 78 | Ht 61.0 in | Wt 151.1 lb

## 2018-03-31 DIAGNOSIS — Z8673 Personal history of transient ischemic attack (TIA), and cerebral infarction without residual deficits: Secondary | ICD-10-CM

## 2018-03-31 DIAGNOSIS — Z87898 Personal history of other specified conditions: Secondary | ICD-10-CM | POA: Diagnosis not present

## 2018-03-31 DIAGNOSIS — G6289 Other specified polyneuropathies: Secondary | ICD-10-CM

## 2018-03-31 DIAGNOSIS — M48062 Spinal stenosis, lumbar region with neurogenic claudication: Secondary | ICD-10-CM

## 2018-03-31 DIAGNOSIS — R292 Abnormal reflex: Secondary | ICD-10-CM

## 2018-03-31 NOTE — Progress Notes (Signed)
I will send referrals.   

## 2018-03-31 NOTE — Progress Notes (Signed)
[facility] Clinic Note - Initial Visit   Date: 03/31/18  Yvonne [name] MRN: [id number] DOB: 18-Apr-1941   Dear Dr. Kennon Rounds:  Thank you for your kind referral of Yvonne [name] for consultation of gait instability. Although her history is well known to you, please allow [place] to reiterate it for the purpose of our medical record. The patient was accompanied to the clinic by self.    History of Present Illness: Yvonne [name] is a 77 y.o. 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    Starting around 2018, she began having numbness and tingling of the feet and ankles, which is constant.  Nothing exacerbates or alleviates her symptoms.  She takes gabapentin 100mg  TID with minimal relief.  Also over the same period of time, she has imbalance and has been falling once every 3-4 months.  Her falls always occur when walking and because her legs feel weak.  She has a sensation of heaviness and weakness of the legs after walking 20-30 steps and needs to rest.  She does not have leg pain.  Resting for about a minute quickly alleviates her weakness.   She does not have leg pain with walking.  She endorses low back pain, leg cramps, and occasional radicular pain involving both legs. She has been using a walker for several years.  She has a history of brain tumor which was excised many years ago.  Review of her CT brain shows that she does have a metal mesh over the left temporal region, which is displaced. She also has a surgical clip in the region.   This would prohibit her from MRI.   Further she had a stroke around the age of 12 manifesting with right sided weakness.  She still has mild right hand weakness.  She has known LICA occlusion.  She is on aspirin 81mg  as well as  statin and BP medications.   Out-side paper records, electronic medical record, and images have been reviewed where available and summarized as:  CT head 08/01/2016: IMPRESSION: 1. No acute intracranial abnormality identified. If symptoms persist or if clinically indicated MRI is more sensitive for acute stroke. 2. Stable encephalomalacia changes along the left sylvian fissure and postsurgical changes related to left hemi craniotomy/cranioplasty. 3. 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 Consider direct visualization. Fluid levels may represent acute on chronic sinusitis.   Past Medical History:  Diagnosis Date  . Allergy   . Anxiety   . Aortic stenosis    mild by 07/2011 echo   . Arthritis   . Heart murmur   . Hypertension   . Hypothyroidism   . Impaired functional mobility, balance, gait, and endurance 09/02/2015  . Mild cognitive impairment with memory loss 09/02/2015  . Stroke [facility])    pt was 40  . Thyroid disease     Past Surgical History:  Procedure Laterality Date  . ABDOMINAL HYSTERECTOMY    . BRAIN SURGERY     age 77's  . TOE AMPUTATION  2013   2nd toe on right foot, hammer toe  . TONSILLECTOMY    . TOTAL HIP ARTHROPLASTY Left 03/19/2017   Procedure: LEFT TOTAL HIP ARTHROPLASTY ANTERIOR APPROACH;  Surgeon: Mcarthur Rossetti, MD;  Location: [facility];  Service:  Orthopedics;  Laterality: Left;     Medications:  Outpatient Encounter Medications as of 03/31/2018  Medication Sig  . amitriptyline (ELAVIL) 50 MG tablet Take 1 tablet (50 mg total) by mouth at bedtime.  Marland Kitchen amLODipine (NORVASC) 5 MG tablet Take 1 tablet (5 mg total) by mouth daily.  Marland Kitchen aspirin 81 MG chewable tablet Chew 81 mg by mouth 2 (two) times daily.  Marland Kitchen atorvastatin (LIPITOR) 10 MG tablet Take 1 tablet (10 mg total) by mouth daily.  . benazepril (LOTENSIN) 40 MG tablet Take 1 tablet (40 mg total) by mouth daily.  Marland Kitchen gabapentin (NEURONTIN) 100 MG  capsule Take 1 capsule (100 mg total) by mouth 3 (three) times daily.  . hydrochlorothiazide (HYDRODIURIL) 25 MG tablet Take 1 tablet (25 mg total) by mouth daily.  Marland Kitchen levothyroxine (SYNTHROID, LEVOTHROID) 75 MCG tablet Take 1 tablet (75 mcg total) by mouth daily.  Marland Kitchen lidocaine-prilocaine (EMLA) cream Apply up to 2 grams three times a day as needed for pain.  . Multiple Vitamin (MULTIVITAMIN WITH MINERALS) TABS tablet Take 1 tablet by mouth daily with breakfast.  . omega-3 acid ethyl esters (LOVAZA) 1 g capsule TAKE 1 CAPSULE TWICE DAILY   No facility-administered encounter medications on file as of 03/31/2018.      Allergies: No Known Allergies  Family History: Family History  Problem Relation Age of Onset  . Thyroid disease Mother   . Heart disease Father     Social History: Social History   Tobacco Use  . Smoking status: Never Smoker  . Smokeless tobacco: Never Used  Substance Use Topics  . Alcohol use: No  . Drug use: No   Social History   Social History Narrative   Health Care POA:    Emergency Contact: son, Doreene Eland, (c) [phone]   End of Life Plan:    Who lives with you: self   Any pets: 2 cats, Sadie and Katie   Diet: Pt has a varied diet and tries to limit sweets.    Exercise: Pt walks on treadmill 10 minutes a day and uses bike 10 minutes a day.   Seatbelts: Pt reports wearing seatbelt when in vehicles.    Sun Exposure/Protection: Pt does not use sun protection.   Hobbies: reading, watching movies, writing          Review of Systems:  CONSTITUTIONAL: No fevers, chills, night sweats, or weight loss.   EYES: No visual changes or eye pain ENT: No hearing changes.  No history of nose bleeds.   RESPIRATORY: No cough, wheezing and shortness of breath.   CARDIOVASCULAR: Negative for chest pain, and palpitations.   GI: Negative for abdominal discomfort, blood in stools or black stools.  No recent change in bowel habits.   GU:  No history of incontinence.     MUSCLOSKELETAL: No history of joint pain or swelling.  No myalgias.   SKIN: Negative for lesions, rash, and itching.   HEMATOLOGY/ONCOLOGY: Negative for prolonged bleeding, bruising easily, and swollen nodes.  No history of cancer.   ENDOCRINE: Negative for cold or heat intolerance, polydipsia or goiter.   PSYCH:  No depression or anxiety symptoms.   NEURO: As Above.   Vital Signs:  BP 140/70   Pulse 78   Ht 5\' 1"  (1.549 m)   Wt 151 lb 2 oz (68.5 kg)   SpO2 94%   BMI 28.55 kg/m    General Medical Exam:   General:  Well appearing, comfortable.   Eyes/ENT: see cranial nerve  examination.   Neck: No masses appreciated.  Full range of motion without tenderness.  No carotid bruits. Respiratory:  Clear to auscultation, good air entry bilaterally.   Cardiac:  Regular rate and rhythm, harsh systolic murmur.   Extremities:  No deformities, edema, or skin discoloration.  Skin:  No rashes or lesions.  Neurological Exam: MENTAL STATUS including orientation to time, place, person, recent and remote memory, attention span and concentration, language, and fund of knowledge is normal.  Speech is not dysarthric.  CRANIAL NERVES: II:  No visual field defects.  Unremarkable fundi.   III-IV-VI: Pupils equal round and reactive to light.  Right eye esotropia at baseline. Normal conjugate, extra-ocular eye movements in all directions of gaze.  No nystagmus.  No ptosis.   V:  Normal facial sensation.   VII:  Normal facial symmetry and movements.   VIII:  Reduced hearing bilaterally.     IX-X:  Normal palatal movement.   XI:  Normal shoulder shrug and head rotation.   XII:  Normal tongue strength and range of motion, no deviation or fasciculation.  MOTOR:  No atrophy, fasciculations or abnormal movements.  No pronator drift.  Tone is normal.    Right Upper Extremity:    Left Upper Extremity:    Deltoid  5/5   Deltoid  5/5   Biceps  5/5   Biceps  5/5   Triceps  5/5   Triceps  5/5   Wrist  extensors  5/5   Wrist extensors  5/5   Wrist flexors  5/5   Wrist flexors  5/5   Finger extensors  5/5   Finger extensors  5/5   Finger flexors  5-/5   Finger flexors  5/5   Dorsal interossei  5-/5   Dorsal interossei  5/5   Abductor pollicis  5-/5   Abductor pollicis  5/5   Tone (Ashworth scale)  0  Tone (Ashworth scale)  0   Right Lower Extremity:    Left Lower Extremity:    Hip flexors  5/5   Hip flexors  5/5   Hip extensors  5/5   Hip extensors  5/5   Knee flexors  5/5   Knee flexors  5/5   Knee extensors  5/5   Knee extensors  5/5   Dorsiflexors  5/5   Dorsiflexors  5/5   Plantarflexors  5/5   Plantarflexors  5/5   Toe extensors  5/5   Toe extensors  5/5   Toe flexors  5/5   Toe flexors  5/5   Tone (Ashworth scale)  0  Tone (Ashworth scale)  0   MSRs:  Right                                                                 Left brachioradialis 2+  brachioradialis 2+  biceps 2+  biceps 2+  triceps 2+  triceps 2+  patellar 3+  patellar 3+  ankle jerk 2+  ankle jerk 2+  Hoffman no  Hoffman no  plantar response down  plantar response down   SENSORY:  Reduced vibration and temperature distal to ankles bilaterally.  Pin prick is intact throughout.  Rhomberg sign is positive.  COORDINATION/GAIT: Normal finger-to- nose-finger.  Intact rapid alternating movements bilaterally. Gait  appears wide-based, small steps, and unsteady.  IMPRESSION: 1.  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  She is not able to get MRI brain due to metal in brain from prior surgery.  Start with CT lumbar spine without contrast, may consider CT myelogram if needed.  Start physical therapy for gait training.  Fall precautions discussed.  Always use a walker.  2.  Peripheral neuropathy, likely idiopathic.  This may certainly be playing a role in her balance, but would not cause exertional leg weakness.  Consider NCS/EMG of the legs, if imaging is negative.  Continue gabapentin  100mg  TID.    3.  History of left MCA stroke with residual mild right hand weakness (age of 39).  LICA occlusion. She is on aspirin, statin, BP therapy.  Risk factors are well-controlled.  LDL 65, BP is normal.  4.  History of brain tumor s/p excision.  Residual metal mesh over the left temporal region, which on imaging appears displaced.  She does not have any seizure-like symptoms.    Further recommendations will be based on the results   Thank you for allowing me to participate in patient's care.  If I can answer any additional questions, I would be pleased to do so.    Sincerely,    Donika K. Posey Pronto, DO

## 2018-03-31 NOTE — Patient Instructions (Signed)
We will order imaging of your back

## 2018-04-01 ENCOUNTER — Other Ambulatory Visit: Payer: Self-pay | Admitting: *Deleted

## 2018-04-01 DIAGNOSIS — M48062 Spinal stenosis, lumbar region with neurogenic claudication: Secondary | ICD-10-CM

## 2018-04-01 DIAGNOSIS — M1990 Unspecified osteoarthritis, unspecified site: Secondary | ICD-10-CM | POA: Diagnosis not present

## 2018-04-01 DIAGNOSIS — G6289 Other specified polyneuropathies: Secondary | ICD-10-CM

## 2018-04-01 DIAGNOSIS — R292 Abnormal reflex: Secondary | ICD-10-CM

## 2018-04-02 DIAGNOSIS — M1990 Unspecified osteoarthritis, unspecified site: Secondary | ICD-10-CM | POA: Diagnosis not present

## 2018-04-03 ENCOUNTER — Telehealth: Payer: Self-pay | Admitting: *Deleted

## 2018-04-03 DIAGNOSIS — M1990 Unspecified osteoarthritis, unspecified site: Secondary | ICD-10-CM | POA: Diagnosis not present

## 2018-04-03 NOTE — Telephone Encounter (Signed)
[facility] appointment is on 04-11-18 at 1:00 pm.

## 2018-04-04 DIAGNOSIS — M1990 Unspecified osteoarthritis, unspecified site: Secondary | ICD-10-CM | POA: Diagnosis not present

## 2018-04-08 DIAGNOSIS — M1990 Unspecified osteoarthritis, unspecified site: Secondary | ICD-10-CM | POA: Diagnosis not present

## 2018-04-09 DIAGNOSIS — M1990 Unspecified osteoarthritis, unspecified site: Secondary | ICD-10-CM | POA: Diagnosis not present

## 2018-04-10 DIAGNOSIS — M1990 Unspecified osteoarthritis, unspecified site: Secondary | ICD-10-CM | POA: Diagnosis not present

## 2018-04-11 DIAGNOSIS — M1990 Unspecified osteoarthritis, unspecified site: Secondary | ICD-10-CM | POA: Diagnosis not present

## 2018-04-11 DIAGNOSIS — R2689 Other abnormalities of gait and mobility: Secondary | ICD-10-CM | POA: Diagnosis not present

## 2018-04-11 DIAGNOSIS — R2681 Unsteadiness on feet: Secondary | ICD-10-CM | POA: Diagnosis not present

## 2018-04-11 DIAGNOSIS — R531 Weakness: Secondary | ICD-10-CM | POA: Diagnosis not present

## 2018-04-14 ENCOUNTER — Other Ambulatory Visit: Payer: Medicare HMO

## 2018-04-15 DIAGNOSIS — M1990 Unspecified osteoarthritis, unspecified site: Secondary | ICD-10-CM | POA: Diagnosis not present

## 2018-04-16 DIAGNOSIS — M1990 Unspecified osteoarthritis, unspecified site: Secondary | ICD-10-CM | POA: Diagnosis not present

## 2018-04-17 DIAGNOSIS — R2689 Other abnormalities of gait and mobility: Secondary | ICD-10-CM | POA: Diagnosis not present

## 2018-04-17 DIAGNOSIS — R2681 Unsteadiness on feet: Secondary | ICD-10-CM | POA: Diagnosis not present

## 2018-04-17 DIAGNOSIS — M1990 Unspecified osteoarthritis, unspecified site: Secondary | ICD-10-CM | POA: Diagnosis not present

## 2018-04-17 DIAGNOSIS — R531 Weakness: Secondary | ICD-10-CM | POA: Diagnosis not present

## 2018-04-18 DIAGNOSIS — M1990 Unspecified osteoarthritis, unspecified site: Secondary | ICD-10-CM | POA: Diagnosis not present

## 2018-04-21 DIAGNOSIS — R2681 Unsteadiness on feet: Secondary | ICD-10-CM | POA: Diagnosis not present

## 2018-04-21 DIAGNOSIS — R2689 Other abnormalities of gait and mobility: Secondary | ICD-10-CM | POA: Diagnosis not present

## 2018-04-21 DIAGNOSIS — R531 Weakness: Secondary | ICD-10-CM | POA: Diagnosis not present

## 2018-04-22 DIAGNOSIS — M1990 Unspecified osteoarthritis, unspecified site: Secondary | ICD-10-CM | POA: Diagnosis not present

## 2018-04-23 DIAGNOSIS — M1990 Unspecified osteoarthritis, unspecified site: Secondary | ICD-10-CM | POA: Diagnosis not present

## 2018-04-24 DIAGNOSIS — M1990 Unspecified osteoarthritis, unspecified site: Secondary | ICD-10-CM | POA: Diagnosis not present

## 2018-04-24 DIAGNOSIS — R2689 Other abnormalities of gait and mobility: Secondary | ICD-10-CM | POA: Diagnosis not present

## 2018-04-24 DIAGNOSIS — R531 Weakness: Secondary | ICD-10-CM | POA: Diagnosis not present

## 2018-04-24 DIAGNOSIS — R2681 Unsteadiness on feet: Secondary | ICD-10-CM | POA: Diagnosis not present

## 2018-04-25 DIAGNOSIS — M1990 Unspecified osteoarthritis, unspecified site: Secondary | ICD-10-CM | POA: Diagnosis not present

## 2018-04-29 ENCOUNTER — Ambulatory Visit
Admission: RE | Admit: 2018-04-29 | Discharge: 2018-04-29 | Disposition: A | Discharge status: Home / Self Care | Payer: Medicare HMO | Source: Ambulatory Visit | Attending: Neurology | Admitting: Neurology

## 2018-04-29 DIAGNOSIS — M5127 Other intervertebral disc displacement, lumbosacral region: Secondary | ICD-10-CM | POA: Diagnosis not present

## 2018-04-29 DIAGNOSIS — M5126 Other intervertebral disc displacement, lumbar region: Secondary | ICD-10-CM | POA: Diagnosis not present

## 2018-04-29 DIAGNOSIS — M48062 Spinal stenosis, lumbar region with neurogenic claudication: Secondary | ICD-10-CM

## 2018-04-29 DIAGNOSIS — R292 Abnormal reflex: Secondary | ICD-10-CM

## 2018-04-29 DIAGNOSIS — M1990 Unspecified osteoarthritis, unspecified site: Secondary | ICD-10-CM | POA: Diagnosis not present

## 2018-04-29 DIAGNOSIS — G6289 Other specified polyneuropathies: Secondary | ICD-10-CM

## 2018-04-29 DIAGNOSIS — M48061 Spinal stenosis, lumbar region without neurogenic claudication: Secondary | ICD-10-CM | POA: Diagnosis not present

## 2018-04-30 ENCOUNTER — Other Ambulatory Visit: Payer: Self-pay | Admitting: Family Medicine

## 2018-04-30 ENCOUNTER — Telehealth: Payer: Self-pay | Admitting: *Deleted

## 2018-04-30 DIAGNOSIS — M1990 Unspecified osteoarthritis, unspecified site: Secondary | ICD-10-CM | POA: Diagnosis not present

## 2018-04-30 DIAGNOSIS — F339 Major depressive disorder, recurrent, unspecified: Secondary | ICD-10-CM

## 2018-04-30 NOTE — Telephone Encounter (Signed)
Attempted to contact patient but call would not go through.  Will try again later.

## 2018-04-30 NOTE — Telephone Encounter (Signed)
-----   Message from Alda Berthold, DO sent at 04/29/2018  4:32 PM EST ----- Please inform patient that her CT lumbar spine shows a large disc herniation and arthritis that is causing impingement of the spinal cord, which explains her exertional leg weakness and fatigue.  I do recommend that she refer I recommend that she see spine surgeon for further evaluation and their opinion.

## 2018-05-01 ENCOUNTER — Telehealth: Payer: Self-pay | Admitting: *Deleted

## 2018-05-01 DIAGNOSIS — M1990 Unspecified osteoarthritis, unspecified site: Secondary | ICD-10-CM | POA: Diagnosis not present

## 2018-05-01 NOTE — Telephone Encounter (Signed)
Attempted to contact patient again but call could not be completed.

## 2018-05-01 NOTE — Telephone Encounter (Signed)
Patient has been given the results and instructions and agrees to see the neurosurgeon.  I will fax the orders to [facility].

## 2018-05-01 NOTE — Telephone Encounter (Signed)
-----   Message from Alda Berthold, DO sent at 04/29/2018  4:32 PM EST ----- Please inform patient that her CT lumbar spine shows a large disc herniation and arthritis that is causing impingement of the spinal cord, which explains her exertional leg weakness and fatigue.  I do recommend that she refer I recommend that she see spine surgeon for further evaluation and their opinion.

## 2018-05-02 DIAGNOSIS — M1990 Unspecified osteoarthritis, unspecified site: Secondary | ICD-10-CM | POA: Diagnosis not present

## 2018-05-06 DIAGNOSIS — M1990 Unspecified osteoarthritis, unspecified site: Secondary | ICD-10-CM | POA: Diagnosis not present

## 2018-05-07 DIAGNOSIS — M1990 Unspecified osteoarthritis, unspecified site: Secondary | ICD-10-CM | POA: Diagnosis not present

## 2018-05-08 DIAGNOSIS — M1990 Unspecified osteoarthritis, unspecified site: Secondary | ICD-10-CM | POA: Diagnosis not present

## 2018-05-09 DIAGNOSIS — M1990 Unspecified osteoarthritis, unspecified site: Secondary | ICD-10-CM | POA: Diagnosis not present

## 2018-05-13 DIAGNOSIS — M1990 Unspecified osteoarthritis, unspecified site: Secondary | ICD-10-CM | POA: Diagnosis not present

## 2018-05-14 DIAGNOSIS — M1990 Unspecified osteoarthritis, unspecified site: Secondary | ICD-10-CM | POA: Diagnosis not present

## 2018-05-15 DIAGNOSIS — M1990 Unspecified osteoarthritis, unspecified site: Secondary | ICD-10-CM | POA: Diagnosis not present

## 2018-05-16 DIAGNOSIS — M1990 Unspecified osteoarthritis, unspecified site: Secondary | ICD-10-CM | POA: Diagnosis not present

## 2018-05-21 ENCOUNTER — Other Ambulatory Visit: Payer: Self-pay | Admitting: Neurosurgery

## 2018-05-21 DIAGNOSIS — M4310 Spondylolisthesis, site unspecified: Secondary | ICD-10-CM | POA: Diagnosis not present

## 2018-05-27 DIAGNOSIS — M1990 Unspecified osteoarthritis, unspecified site: Secondary | ICD-10-CM | POA: Diagnosis not present

## 2018-05-28 ENCOUNTER — Telehealth: Payer: Self-pay | Admitting: Family Medicine

## 2018-05-28 DIAGNOSIS — M1990 Unspecified osteoarthritis, unspecified site: Secondary | ICD-10-CM | POA: Diagnosis not present

## 2018-05-28 NOTE — Telephone Encounter (Signed)
Pt appears on [facility] Quality Report for [facility], but has never been seen in the office.  I called the pt and left a message asking her to call me at [phone] to confirm their PCP.  VDM (DD)

## 2018-05-29 DIAGNOSIS — M1990 Unspecified osteoarthritis, unspecified site: Secondary | ICD-10-CM | POA: Diagnosis not present

## 2018-05-30 DIAGNOSIS — M1990 Unspecified osteoarthritis, unspecified site: Secondary | ICD-10-CM | POA: Diagnosis not present

## 2018-06-02 ENCOUNTER — Other Ambulatory Visit (HOSPITAL_COMMUNITY): Payer: Medicare HMO

## 2018-06-02 ENCOUNTER — Other Ambulatory Visit: Payer: Self-pay

## 2018-06-02 ENCOUNTER — Telehealth: Payer: Self-pay | Admitting: Family Medicine

## 2018-06-02 ENCOUNTER — Encounter (HOSPITAL_COMMUNITY): Payer: Self-pay

## 2018-06-02 ENCOUNTER — Encounter (HOSPITAL_COMMUNITY)
Admission: RE | Admit: 2018-06-02 | Discharge: 2018-06-02 | Disposition: A | Discharge status: Home / Self Care | Payer: Medicare HMO | Source: Ambulatory Visit | Attending: Neurosurgery | Admitting: Neurosurgery

## 2018-06-02 DIAGNOSIS — Z01812 Encounter for preprocedural laboratory examination: Secondary | ICD-10-CM | POA: Insufficient documentation

## 2018-06-02 LAB — CBC WITH DIFFERENTIAL/PLATELET
Abs Immature Granulocytes: 0.04 10*3/uL (ref 0.00–0.07)
BASOS ABS: 0.1 10*3/uL (ref 0.0–0.1)
Basophils Relative: 1 %
Eosinophils Absolute: 0.3 10*3/uL (ref 0.0–0.5)
Eosinophils Relative: 3 %
HCT: 45.4 % (ref 36.0–46.0)
Hemoglobin: 14.6 g/dL (ref 12.0–15.0)
Immature Granulocytes: 0 %
Lymphocytes Relative: 25 %
Lymphs Abs: 2.7 10*3/uL (ref 0.7–4.0)
MCH: 29.3 pg (ref 26.0–34.0)
MCHC: 32.2 g/dL (ref 30.0–36.0)
MCV: 91.2 fL (ref 80.0–100.0)
Monocytes Absolute: 0.9 10*3/uL (ref 0.1–1.0)
Monocytes Relative: 8 %
NRBC: 0 % (ref 0.0–0.2)
Neutro Abs: 7.1 10*3/uL (ref 1.7–7.7)
Neutrophils Relative %: 63 %
Platelets: 397 10*3/uL (ref 150–400)
RBC: 4.98 MIL/uL (ref 3.87–5.11)
RDW: 13.2 % (ref 11.5–15.5)
WBC: 11 10*3/uL — ABNORMAL HIGH (ref 4.0–10.5)

## 2018-06-02 LAB — BASIC METABOLIC PANEL
Anion gap: 9 (ref 5–15)
BUN: 9 mg/dL (ref 8–23)
CO2: 29 mmol/L (ref 22–32)
Calcium: 9.3 mg/dL (ref 8.9–10.3)
Chloride: 102 mmol/L (ref 98–111)
Creatinine, Ser: 0.72 mg/dL (ref 0.44–1.00)
GFR calc Af Amer: >60 mL/min (ref >60)
GFR calc non Af Amer: >60 mL/min (ref >60)
Glucose, Bld: 92 mg/dL (ref 70–99)
Potassium: 3.9 mmol/L (ref 3.5–5.1)
Sodium: 140 mmol/L (ref 135–145)

## 2018-06-02 LAB — ABO/RH: ABO/RH(D): B POS

## 2018-06-02 LAB — SURGICAL PCR SCREEN
MRSA, PCR: POSITIVE — AB
Staphylococcus aureus: POSITIVE — AB

## 2018-06-02 LAB — TYPE AND SCREEN
ABO/RH(D): B POS
ANTIBODY SCREEN: NEGATIVE

## 2018-06-02 NOTE — Pre-Procedure Instructions (Signed)
LATRISE BOWLAND  06/02/2018      [facility] [place], [place] - [address] [place] [address] Phone: [phone] Fax: [facility] [place], [place] [address] Phone: [phone] Fax: [place] [address] Phone: [phone] Fax: [phone]    Your procedure is scheduled on Friday, December 20th.  Report to [facility] Admitting at 7:30 A.M.  Call this number if you have problems the morning of surgery:  [phone]   Remember:  Do not eat or drink after midnight.    Take these medicines the morning of surgery with A SIP OF WATER  amLODipine (NORVASC)  gabapentin (NEURONTIN)  levothyroxine (SYNTHROID, LEVOTHROID)   Follow your surgeon's instructions on when to stop Asprin.  If no instructions were given by your surgeon then you will need to call the office to get those instructions.    As of today, STOP taking any Aspirin(unless otherwise instructed by your surgeon), Aleve, Naproxen, Ibuprofen, Motrin, Advil, Goody's, BC's, all herbal medications, fish oil, and all vitamins and supplements.      Do not wear jewelry, make-up or nail polish.  Do not wear lotions, powders, or perfumes, or deodorant.  Do not shave 48 hours prior to surgery.   Do not bring valuables to the hospital.  [facility] is not responsible for any belongings or valuables.  Contacts, dentures or bridgework may not be worn into surgery.  Leave your suitcase in the car.  After surgery it may be brought to your room.  For patients admitted to the hospital, discharge time will be determined by your treatment team.  Patients discharged the day of surgery will not be allowed to drive home.   Special instructions:   [facility]- Preparing For  Surgery  Before surgery, you can play an important role. Because skin is not sterile, your skin needs to be as free of germs as possible. You can reduce the number of germs on your skin by washing with CHG (chlorahexidine gluconate) Soap before surgery.  CHG is an antiseptic cleaner which kills germs and bonds with the skin to continue killing germs even after washing.    Oral Hygiene is also important to reduce your risk of infection.  Remember - BRUSH YOUR TEETH THE MORNING OF SURGERY WITH YOUR REGULAR TOOTHPASTE  Please do not use if you have an allergy to CHG or antibacterial soaps. If your skin becomes reddened/irritated stop using the CHG.  Do not shave (including legs and underarms) for at least 48 hours prior to first CHG shower. It is OK to shave your face.  Please follow these instructions carefully.   1. Shower the NIGHT BEFORE SURGERY and the MORNING OF SURGERY with CHG.   2. If you chose to wash your hair, wash your hair first as usual with your normal shampoo.  3. After you shampoo, rinse your hair and body thoroughly to remove the shampoo.  4. Use CHG as you would any other liquid soap. You can apply CHG directly to the skin and wash gently with a scrungie or a clean washcloth.   5. Apply the CHG Soap to your body ONLY FROM THE NECK DOWN.  Do not use on open wounds or open sores. Avoid contact with your eyes, ears, mouth  and genitals (private parts). Wash Face and genitals (private parts)  with your normal soap.  6. Wash thoroughly, paying special attention to the area where your surgery will be performed.  7. Thoroughly rinse your body with warm water from the neck down.  8. DO NOT shower/wash with your normal soap after using and rinsing off the CHG Soap.  9. Pat yourself dry with a CLEAN TOWEL.  10. Wear CLEAN PAJAMAS to bed the night before surgery, wear comfortable clothes the morning of surgery  11. Place CLEAN SHEETS on your bed the night of your first shower and  DO NOT SLEEP WITH PETS.    Day of Surgery:  Do not apply any deodorants/lotions.  Please wear clean clothes to the hospital/surgery center.   Remember to brush your teeth WITH YOUR REGULAR TOOTHPASTE.    Please read over the following fact sheets that you were given.

## 2018-06-02 NOTE — Telephone Encounter (Signed)
2nd attempt to reach pt to confirm PCP.  There was no answer and no option to leave a message. VDM (DD)

## 2018-06-02 NOTE — Progress Notes (Signed)
PCP -Dr. Darron Doom  Cardiologist - Dr. Cathie Olden  Chest x-ray - N/A EKG - 02/10/18 Stress Test - denies ECHO - 2013 Cardiac Cath -denies   Sleep Study - denies  Aspirin Instructions: Hold 5-7 days prior to surgery. Last dose 05/30/18  Anesthesia review: No  Patient denies shortness of breath, fever, cough and chest pain at PAT appointment   Patient verbalized understanding of instructions that were given to them at the PAT appointment. Patient was also instructed that they will need to review over the PAT instructions again at home before surgery.

## 2018-06-03 DIAGNOSIS — M1990 Unspecified osteoarthritis, unspecified site: Secondary | ICD-10-CM | POA: Diagnosis not present

## 2018-06-04 DIAGNOSIS — M1990 Unspecified osteoarthritis, unspecified site: Secondary | ICD-10-CM | POA: Diagnosis not present

## 2018-06-05 ENCOUNTER — Telehealth: Payer: Self-pay | Admitting: Internal Medicine

## 2018-06-05 DIAGNOSIS — M1990 Unspecified osteoarthritis, unspecified site: Secondary | ICD-10-CM | POA: Diagnosis not present

## 2018-06-05 MED ORDER — CEFAZOLIN SODIUM-DEXTROSE 2-4 GM/100ML-% IV SOLN
2.0000 g | INTRAVENOUS | Status: AC
  Administered 2018-06-06: 2 g via INTRAVENOUS
  Filled 2018-06-05: qty 100

## 2018-06-05 MED ORDER — VANCOMYCIN HCL IN DEXTROSE 1-5 GM/200ML-% IV SOLN
1000.0000 mg | INTRAVENOUS | Status: AC
  Administered 2018-06-06: 1000 mg via INTRAVENOUS
  Filled 2018-06-05: qty 200

## 2018-06-05 NOTE — Telephone Encounter (Signed)
I spoke with the patient, and she confirmed that Darron Doom with [facility] is her PCP. VDM (DD)

## 2018-06-06 ENCOUNTER — Inpatient Hospital Stay (HOSPITAL_COMMUNITY)
Admission: RE | Admit: 2018-06-06 | Discharge: 2018-06-16 | DRG: 455 | Disposition: A | Discharge status: Skilled Nursing Facility | Payer: Medicare HMO | Attending: Neurosurgery | Admitting: Neurosurgery

## 2018-06-06 ENCOUNTER — Inpatient Hospital Stay (HOSPITAL_COMMUNITY): Payer: Medicare HMO

## 2018-06-06 ENCOUNTER — Encounter (HOSPITAL_COMMUNITY): Payer: Self-pay | Admitting: Urology

## 2018-06-06 ENCOUNTER — Inpatient Hospital Stay (HOSPITAL_COMMUNITY): Payer: Medicare HMO | Admitting: Certified Registered Nurse Anesthetist

## 2018-06-06 ENCOUNTER — Encounter (HOSPITAL_COMMUNITY)
Admission: RE | Disposition: A | Discharge status: Skilled Nursing Facility | Payer: Self-pay | Source: Home / Self Care | Attending: Neurosurgery

## 2018-06-06 DIAGNOSIS — Z419 Encounter for procedure for purposes other than remedying health state, unspecified: Secondary | ICD-10-CM

## 2018-06-06 DIAGNOSIS — M2550 Pain in unspecified joint: Secondary | ICD-10-CM | POA: Diagnosis not present

## 2018-06-06 DIAGNOSIS — Z96642 Presence of left artificial hip joint: Secondary | ICD-10-CM | POA: Diagnosis present

## 2018-06-06 DIAGNOSIS — E039 Hypothyroidism, unspecified: Secondary | ICD-10-CM | POA: Diagnosis present

## 2018-06-06 DIAGNOSIS — Z79890 Hormone replacement therapy (postmenopausal): Secondary | ICD-10-CM | POA: Diagnosis not present

## 2018-06-06 DIAGNOSIS — Z4789 Encounter for other orthopedic aftercare: Secondary | ICD-10-CM | POA: Diagnosis not present

## 2018-06-06 DIAGNOSIS — F419 Anxiety disorder, unspecified: Secondary | ICD-10-CM | POA: Diagnosis present

## 2018-06-06 DIAGNOSIS — Z8349 Family history of other endocrine, nutritional and metabolic diseases: Secondary | ICD-10-CM | POA: Diagnosis not present

## 2018-06-06 DIAGNOSIS — Z981 Arthrodesis status: Secondary | ICD-10-CM | POA: Diagnosis not present

## 2018-06-06 DIAGNOSIS — M5416 Radiculopathy, lumbar region: Secondary | ICD-10-CM | POA: Diagnosis present

## 2018-06-06 DIAGNOSIS — I9581 Postprocedural hypotension: Secondary | ICD-10-CM | POA: Diagnosis not present

## 2018-06-06 DIAGNOSIS — M1990 Unspecified osteoarthritis, unspecified site: Secondary | ICD-10-CM | POA: Diagnosis not present

## 2018-06-06 DIAGNOSIS — H9190 Unspecified hearing loss, unspecified ear: Secondary | ICD-10-CM | POA: Diagnosis present

## 2018-06-06 DIAGNOSIS — G3184 Mild cognitive impairment, so stated: Secondary | ICD-10-CM | POA: Diagnosis present

## 2018-06-06 DIAGNOSIS — M4316 Spondylolisthesis, lumbar region: Secondary | ICD-10-CM | POA: Diagnosis not present

## 2018-06-06 DIAGNOSIS — Z9089 Acquired absence of other organs: Secondary | ICD-10-CM

## 2018-06-06 DIAGNOSIS — Z89421 Acquired absence of other right toe(s): Secondary | ICD-10-CM | POA: Diagnosis not present

## 2018-06-06 DIAGNOSIS — Z7982 Long term (current) use of aspirin: Secondary | ICD-10-CM | POA: Diagnosis not present

## 2018-06-06 DIAGNOSIS — R41841 Cognitive communication deficit: Secondary | ICD-10-CM | POA: Diagnosis not present

## 2018-06-06 DIAGNOSIS — Z79899 Other long term (current) drug therapy: Secondary | ICD-10-CM

## 2018-06-06 DIAGNOSIS — I10 Essential (primary) hypertension: Secondary | ICD-10-CM | POA: Diagnosis present

## 2018-06-06 DIAGNOSIS — M48061 Spinal stenosis, lumbar region without neurogenic claudication: Secondary | ICD-10-CM | POA: Diagnosis not present

## 2018-06-06 DIAGNOSIS — M48062 Spinal stenosis, lumbar region with neurogenic claudication: Secondary | ICD-10-CM | POA: Diagnosis not present

## 2018-06-06 DIAGNOSIS — Z8249 Family history of ischemic heart disease and other diseases of the circulatory system: Secondary | ICD-10-CM

## 2018-06-06 DIAGNOSIS — I350 Nonrheumatic aortic (valve) stenosis: Secondary | ICD-10-CM | POA: Diagnosis not present

## 2018-06-06 DIAGNOSIS — Z90710 Acquired absence of both cervix and uterus: Secondary | ICD-10-CM

## 2018-06-06 DIAGNOSIS — R2689 Other abnormalities of gait and mobility: Secondary | ICD-10-CM | POA: Diagnosis not present

## 2018-06-06 DIAGNOSIS — M4310 Spondylolisthesis, site unspecified: Secondary | ICD-10-CM | POA: Diagnosis present

## 2018-06-06 DIAGNOSIS — M6281 Muscle weakness (generalized): Secondary | ICD-10-CM | POA: Diagnosis not present

## 2018-06-06 DIAGNOSIS — Z7401 Bed confinement status: Secondary | ICD-10-CM | POA: Diagnosis not present

## 2018-06-06 DIAGNOSIS — R262 Difficulty in walking, not elsewhere classified: Secondary | ICD-10-CM | POA: Diagnosis not present

## 2018-06-06 DIAGNOSIS — Z8673 Personal history of transient ischemic attack (TIA), and cerebral infarction without residual deficits: Secondary | ICD-10-CM | POA: Diagnosis not present

## 2018-06-06 DIAGNOSIS — M4326 Fusion of spine, lumbar region: Secondary | ICD-10-CM | POA: Diagnosis not present

## 2018-06-06 HISTORY — DX: Pneumonia, unspecified organism: J18.9

## 2018-06-06 HISTORY — DX: Anemia, unspecified: D64.9

## 2018-06-06 HISTORY — DX: Other specified health status: Z78.9

## 2018-06-06 SURGERY — POSTERIOR LUMBAR FUSION 1 LEVEL
Anesthesia: General | Site: Back

## 2018-06-06 MED ORDER — THROMBIN 20000 UNITS EX SOLR
CUTANEOUS | Status: DC | PRN
  Administered 2018-06-06: 20 mL via TOPICAL

## 2018-06-06 MED ORDER — ROCURONIUM BROMIDE 10 MG/ML (PF) SYRINGE
PREFILLED_SYRINGE | INTRAVENOUS | Status: DC | PRN
  Administered 2018-06-06: 20 mg via INTRAVENOUS
  Administered 2018-06-06: 50 mg via INTRAVENOUS
  Administered 2018-06-06: 30 mg via INTRAVENOUS

## 2018-06-06 MED ORDER — CHLORHEXIDINE GLUCONATE CLOTH 2 % EX PADS: 6.0000 | MEDICATED_PAD | Freq: Once | CUTANEOUS | Status: DC

## 2018-06-06 MED ORDER — ROCURONIUM BROMIDE 50 MG/5ML IV SOSY
PREFILLED_SYRINGE | INTRAVENOUS | Status: AC
  Filled 2018-06-06: qty 10

## 2018-06-06 MED ORDER — ADULT MULTIVITAMIN W/MINERALS CH
1.0000 | ORAL_TABLET | Freq: Every day | ORAL | Status: DC
  Administered 2018-06-07 – 2018-06-16 (×10): 1 via ORAL
  Filled 2018-06-06 (×10): qty 1

## 2018-06-06 MED ORDER — AMITRIPTYLINE HCL 25 MG PO TABS
150.0000 mg | ORAL_TABLET | Freq: Every day | ORAL | Status: DC
  Administered 2018-06-06 – 2018-06-15 (×10): 150 mg via ORAL
  Filled 2018-06-06 (×10): qty 6

## 2018-06-06 MED ORDER — THROMBIN 20000 UNITS EX SOLR
CUTANEOUS | Status: AC
  Filled 2018-06-06: qty 20000

## 2018-06-06 MED ORDER — PHENOL 1.4 % MT LIQD: 1.0000 | OROMUCOSAL | Status: DC | PRN

## 2018-06-06 MED ORDER — FENTANYL CITRATE (PF) 250 MCG/5ML IJ SOLN
INTRAMUSCULAR | Status: DC | PRN
  Administered 2018-06-06 (×4): 50 ug via INTRAVENOUS

## 2018-06-06 MED ORDER — HYDROCHLOROTHIAZIDE 25 MG PO TABS
25.0000 mg | ORAL_TABLET | Freq: Every day | ORAL | Status: DC
  Administered 2018-06-08 – 2018-06-16 (×8): 25 mg via ORAL
  Filled 2018-06-06 (×10): qty 1

## 2018-06-06 MED ORDER — ONDANSETRON HCL 4 MG/2ML IJ SOLN
INTRAMUSCULAR | Status: AC
  Filled 2018-06-06: qty 2

## 2018-06-06 MED ORDER — HYDROMORPHONE HCL 1 MG/ML IJ SOLN
1.0000 mg | INTRAMUSCULAR | Status: DC | PRN
  Administered 2018-06-13 (×2): 1 mg via INTRAVENOUS
  Filled 2018-06-06 (×2): qty 1

## 2018-06-06 MED ORDER — ASPIRIN 81 MG PO CHEW
81.0000 mg | CHEWABLE_TABLET | Freq: Two times a day (BID) | ORAL | Status: DC
  Administered 2018-06-06 – 2018-06-16 (×20): 81 mg via ORAL
  Filled 2018-06-06 (×20): qty 1

## 2018-06-06 MED ORDER — VANCOMYCIN HCL 1000 MG IV SOLR
INTRAVENOUS | Status: AC
  Filled 2018-06-06: qty 1000

## 2018-06-06 MED ORDER — ACETAMINOPHEN 325 MG PO TABS
650.0000 mg | ORAL_TABLET | ORAL | Status: DC | PRN
  Administered 2018-06-10 – 2018-06-16 (×8): 650 mg via ORAL
  Filled 2018-06-06 (×8): qty 2

## 2018-06-06 MED ORDER — EPHEDRINE 5 MG/ML INJ
INTRAVENOUS | Status: AC
  Filled 2018-06-06: qty 10

## 2018-06-06 MED ORDER — SODIUM CHLORIDE 0.9% FLUSH
3.0000 mL | INTRAVENOUS | Status: DC | PRN
  Administered 2018-06-06: 3 mL via INTRAVENOUS
  Filled 2018-06-06: qty 3

## 2018-06-06 MED ORDER — DEXAMETHASONE SODIUM PHOSPHATE 10 MG/ML IJ SOLN
10.0000 mg | INTRAMUSCULAR | Status: AC
  Administered 2018-06-06: 10 mg via INTRAVENOUS
  Filled 2018-06-06: qty 1

## 2018-06-06 MED ORDER — SODIUM CHLORIDE 0.9 % IV SOLN
INTRAVENOUS | Status: DC | PRN
  Administered 2018-06-06: 25 ug/min via INTRAVENOUS
  Administered 2018-06-06: 20 ug/min via INTRAVENOUS

## 2018-06-06 MED ORDER — THROMBIN 5000 UNITS EX SOLR
CUTANEOUS | Status: AC
  Filled 2018-06-06: qty 5000

## 2018-06-06 MED ORDER — FLEET ENEMA 7-19 GM/118ML RE ENEM: 1.0000 | ENEMA | Freq: Once | RECTAL | Status: DC | PRN

## 2018-06-06 MED ORDER — AMITRIPTYLINE HCL 25 MG PO TABS
150.0000 mg | ORAL_TABLET | Freq: Every day | ORAL | Status: DC
  Filled 2018-06-06: qty 6

## 2018-06-06 MED ORDER — HYDROCODONE-ACETAMINOPHEN 10-325 MG PO TABS
1.0000 | ORAL_TABLET | ORAL | Status: DC | PRN
  Administered 2018-06-07 – 2018-06-14 (×18): 1 via ORAL
  Filled 2018-06-06 (×20): qty 1

## 2018-06-06 MED ORDER — OXYCODONE HCL 5 MG PO TABS
10.0000 mg | ORAL_TABLET | ORAL | Status: DC | PRN
  Administered 2018-06-12: 10 mg via ORAL
  Filled 2018-06-06: qty 2

## 2018-06-06 MED ORDER — FENTANYL CITRATE (PF) 100 MCG/2ML IJ SOLN: 25.0000 ug | INTRAMUSCULAR | Status: DC | PRN

## 2018-06-06 MED ORDER — FENTANYL CITRATE (PF) 250 MCG/5ML IJ SOLN
INTRAMUSCULAR | Status: AC
  Filled 2018-06-06: qty 5

## 2018-06-06 MED ORDER — LEVOTHYROXINE SODIUM 75 MCG PO TABS
75.0000 ug | ORAL_TABLET | Freq: Every day | ORAL | Status: DC
  Administered 2018-06-07 – 2018-06-16 (×10): 75 ug via ORAL
  Filled 2018-06-06 (×10): qty 1

## 2018-06-06 MED ORDER — PROPOFOL 10 MG/ML IV BOLUS
INTRAVENOUS | Status: AC
  Filled 2018-06-06: qty 20

## 2018-06-06 MED ORDER — SODIUM CHLORIDE 0.9% FLUSH
3.0000 mL | Freq: Two times a day (BID) | INTRAVENOUS | Status: DC
  Administered 2018-06-06 – 2018-06-16 (×17): 3 mL via INTRAVENOUS

## 2018-06-06 MED ORDER — DEXAMETHASONE SODIUM PHOSPHATE 10 MG/ML IJ SOLN
INTRAMUSCULAR | Status: AC
  Filled 2018-06-06: qty 1

## 2018-06-06 MED ORDER — GLUCOSAMINE-CHONDROITIN 250-200 MG PO TABS: ORAL_TABLET | Freq: Every day | ORAL | Status: DC

## 2018-06-06 MED ORDER — LACTATED RINGERS IV SOLN
INTRAVENOUS | Status: DC
  Administered 2018-06-06: 08:00:00 via INTRAVENOUS

## 2018-06-06 MED ORDER — DIAZEPAM 5 MG PO TABS
5.0000 mg | ORAL_TABLET | Freq: Four times a day (QID) | ORAL | Status: DC | PRN
  Administered 2018-06-13 – 2018-06-15 (×2): 10 mg via ORAL
  Filled 2018-06-06 (×2): qty 2

## 2018-06-06 MED ORDER — GABAPENTIN 100 MG PO CAPS
100.0000 mg | ORAL_CAPSULE | Freq: Three times a day (TID) | ORAL | Status: DC
  Administered 2018-06-06 – 2018-06-16 (×31): 100 mg via ORAL
  Filled 2018-06-06 (×32): qty 1

## 2018-06-06 MED ORDER — OMEGA-3-ACID ETHYL ESTERS 1 G PO CAPS
1.0000 g | ORAL_CAPSULE | Freq: Two times a day (BID) | ORAL | Status: DC
  Administered 2018-06-06 – 2018-06-16 (×20): 1 g via ORAL
  Filled 2018-06-06 (×23): qty 1

## 2018-06-06 MED ORDER — BENAZEPRIL HCL 20 MG PO TABS
40.0000 mg | ORAL_TABLET | Freq: Every day | ORAL | Status: DC
  Administered 2018-06-08 – 2018-06-16 (×8): 40 mg via ORAL
  Filled 2018-06-06 (×11): qty 2

## 2018-06-06 MED ORDER — BUPIVACAINE HCL (PF) 0.25 % IJ SOLN
INTRAMUSCULAR | Status: AC
  Filled 2018-06-06: qty 30

## 2018-06-06 MED ORDER — ONDANSETRON HCL 4 MG/2ML IJ SOLN: 4.0000 mg | Freq: Four times a day (QID) | INTRAMUSCULAR | Status: DC | PRN

## 2018-06-06 MED ORDER — PROPOFOL 10 MG/ML IV BOLUS
INTRAVENOUS | Status: DC | PRN
  Administered 2018-06-06: 150 mg via INTRAVENOUS

## 2018-06-06 MED ORDER — BISACODYL 10 MG RE SUPP: 10.0000 mg | Freq: Every day | RECTAL | Status: DC | PRN

## 2018-06-06 MED ORDER — ATORVASTATIN CALCIUM 10 MG PO TABS
10.0000 mg | ORAL_TABLET | Freq: Every day | ORAL | Status: DC
  Administered 2018-06-07 – 2018-06-16 (×10): 10 mg via ORAL
  Filled 2018-06-06 (×11): qty 1

## 2018-06-06 MED ORDER — LIDOCAINE 2% (20 MG/ML) 5 ML SYRINGE
INTRAMUSCULAR | Status: DC | PRN
  Administered 2018-06-06: 100 mg via INTRAVENOUS

## 2018-06-06 MED ORDER — VANCOMYCIN HCL 1 G IV SOLR
INTRAVENOUS | Status: DC | PRN
  Administered 2018-06-06: 1000 mg

## 2018-06-06 MED ORDER — ACETAMINOPHEN 650 MG RE SUPP: 650.0000 mg | RECTAL | Status: DC | PRN

## 2018-06-06 MED ORDER — POLYETHYLENE GLYCOL 3350 17 G PO PACK
17.0000 g | PACK | Freq: Every day | ORAL | Status: DC | PRN
  Administered 2018-06-09 – 2018-06-11 (×3): 17 g via ORAL
  Filled 2018-06-06 (×4): qty 1

## 2018-06-06 MED ORDER — AMLODIPINE BESYLATE 5 MG PO TABS
5.0000 mg | ORAL_TABLET | Freq: Every day | ORAL | Status: DC
  Administered 2018-06-08 – 2018-06-16 (×8): 5 mg via ORAL
  Filled 2018-06-06 (×10): qty 1

## 2018-06-06 MED ORDER — 0.9 % SODIUM CHLORIDE (POUR BTL) OPTIME
TOPICAL | Status: DC | PRN
  Administered 2018-06-06: 1000 mL

## 2018-06-06 MED ORDER — LIDOCAINE 2% (20 MG/ML) 5 ML SYRINGE
INTRAMUSCULAR | Status: AC
  Filled 2018-06-06: qty 5

## 2018-06-06 MED ORDER — SUGAMMADEX SODIUM 200 MG/2ML IV SOLN
INTRAVENOUS | Status: DC | PRN
  Administered 2018-06-06: 150 mg via INTRAVENOUS

## 2018-06-06 MED ORDER — SODIUM CHLORIDE 0.9 % IV SOLN
250.0000 mL | INTRAVENOUS | Status: DC
  Administered 2018-06-06: 250 mL via INTRAVENOUS

## 2018-06-06 MED ORDER — SODIUM CHLORIDE 0.9 % IV SOLN
INTRAVENOUS | Status: DC | PRN
  Administered 2018-06-06: 500 mL

## 2018-06-06 MED ORDER — EPHEDRINE SULFATE-NACL 50-0.9 MG/10ML-% IV SOSY
PREFILLED_SYRINGE | INTRAVENOUS | Status: DC | PRN
  Administered 2018-06-06 (×3): 5 mg via INTRAVENOUS

## 2018-06-06 MED ORDER — ONDANSETRON HCL 4 MG/2ML IJ SOLN
INTRAMUSCULAR | Status: DC | PRN
  Administered 2018-06-06: 4 mg via INTRAVENOUS

## 2018-06-06 MED ORDER — CEFAZOLIN SODIUM-DEXTROSE 1-4 GM/50ML-% IV SOLN
1.0000 g | Freq: Three times a day (TID) | INTRAVENOUS | Status: AC
  Administered 2018-06-06 – 2018-06-07 (×2): 1 g via INTRAVENOUS
  Filled 2018-06-06 (×2): qty 50

## 2018-06-06 MED ORDER — ONDANSETRON HCL 4 MG PO TABS: 4.0000 mg | ORAL_TABLET | Freq: Four times a day (QID) | ORAL | Status: DC | PRN

## 2018-06-06 MED ORDER — BUPIVACAINE HCL (PF) 0.25 % IJ SOLN
INTRAMUSCULAR | Status: DC | PRN
  Administered 2018-06-06: 20 mL

## 2018-06-06 MED ORDER — MENTHOL 3 MG MT LOZG: 1.0000 | LOZENGE | OROMUCOSAL | Status: DC | PRN

## 2018-06-06 SURGICAL SUPPLY — 67 items
BAG DECANTER FOR FLEXI CONT (MISCELLANEOUS) ×3 IMPLANT
BENZOIN TINCTURE PRP APPL 2/3 (GAUZE/BANDAGES/DRESSINGS) ×3 IMPLANT
BLADE CLIPPER SURG (BLADE) IMPLANT
BUR CUTTER 7.0 ROUND (BURR) IMPLANT
BUR MATCHSTICK NEURO 3.0 LAGG (BURR) ×3 IMPLANT
CANISTER SUCT 3000ML PPV (MISCELLANEOUS) ×3 IMPLANT
CAP LCK SPNE (Orthopedic Implant) ×4 IMPLANT
CAP LOCK SPINE RADIUS (Orthopedic Implant) ×4 IMPLANT
CAP LOCKING (Orthopedic Implant) ×8 IMPLANT
CARTRIDGE OIL MAESTRO DRILL (MISCELLANEOUS) ×1 IMPLANT
CLOSURE STERI-STRIP 1/2X4 (GAUZE/BANDAGES/DRESSINGS) ×2
CLOSURE WOUND 1/2 X4 (GAUZE/BANDAGES/DRESSINGS) ×2
CLSR STERI-STRIP ANTIMIC 1/2X4 (GAUZE/BANDAGES/DRESSINGS) ×4 IMPLANT
CONT SPEC 4OZ CLIKSEAL STRL BL (MISCELLANEOUS) ×3 IMPLANT
COVER BACK TABLE 60X90IN (DRAPES) ×3 IMPLANT
COVER WAND RF STERILE (DRAPES) ×3 IMPLANT
DECANTER SPIKE VIAL GLASS SM (MISCELLANEOUS) ×3 IMPLANT
DERMABOND ADVANCED (GAUZE/BANDAGES/DRESSINGS) ×2
DERMABOND ADVANCED .7 DNX12 (GAUZE/BANDAGES/DRESSINGS) ×1 IMPLANT
DEVICE INTERBODY ELEVATE 23X9 (Cage) ×6 IMPLANT
DIFFUSER DRILL AIR PNEUMATIC (MISCELLANEOUS) ×3 IMPLANT
DRAPE C-ARM 42X72 X-RAY (DRAPES) ×6 IMPLANT
DRAPE HALF SHEET 40X57 (DRAPES) ×3 IMPLANT
DRAPE LAPAROTOMY 100X72X124 (DRAPES) ×3 IMPLANT
DRAPE SURG 17X23 STRL (DRAPES) ×12 IMPLANT
DRSG OPSITE POSTOP 4X6 (GAUZE/BANDAGES/DRESSINGS) ×3 IMPLANT
DURAPREP 26ML APPLICATOR (WOUND CARE) ×3 IMPLANT
ELECT REM PT RETURN 9FT ADLT (ELECTROSURGICAL) ×3
ELECTRODE REM PT RTRN 9FT ADLT (ELECTROSURGICAL) ×1 IMPLANT
EVACUATOR 1/8 PVC DRAIN (DRAIN) IMPLANT
GAUZE 4X4 16PLY RFD (DISPOSABLE) IMPLANT
GAUZE SPONGE 4X4 12PLY STRL (GAUZE/BANDAGES/DRESSINGS) IMPLANT
GLOVE BIO SURGEON STRL SZ 6.5 (GLOVE) ×10 IMPLANT
GLOVE BIO SURGEONS STRL SZ 6.5 (GLOVE) ×5
GLOVE BIOGEL PI IND STRL 6.5 (GLOVE) ×3 IMPLANT
GLOVE BIOGEL PI IND STRL 7.0 (GLOVE) ×2 IMPLANT
GLOVE BIOGEL PI INDICATOR 6.5 (GLOVE) ×6
GLOVE BIOGEL PI INDICATOR 7.0 (GLOVE) ×4
GLOVE ECLIPSE 9.0 STRL (GLOVE) ×6 IMPLANT
GLOVE EXAM NITRILE XL STR (GLOVE) IMPLANT
GOWN STRL REUS W/ TWL LRG LVL3 (GOWN DISPOSABLE) ×4 IMPLANT
GOWN STRL REUS W/ TWL XL LVL3 (GOWN DISPOSABLE) ×2 IMPLANT
GOWN STRL REUS W/TWL 2XL LVL3 (GOWN DISPOSABLE) IMPLANT
GOWN STRL REUS W/TWL LRG LVL3 (GOWN DISPOSABLE) ×8
GOWN STRL REUS W/TWL XL LVL3 (GOWN DISPOSABLE) ×4
GRAFT BN 5X1XSPNE CVD POST DBM (Bone Implant) ×1 IMPLANT
GRAFT BONE MAGNIFUSE 1X5CM (Bone Implant) ×2 IMPLANT
KIT BASIN OR (CUSTOM PROCEDURE TRAY) ×3 IMPLANT
KIT TURNOVER KIT B (KITS) ×3 IMPLANT
MILL MEDIUM DISP (BLADE) ×3 IMPLANT
NEEDLE HYPO 22GX1.5 SAFETY (NEEDLE) ×3 IMPLANT
NS IRRIG 1000ML POUR BTL (IV SOLUTION) ×3 IMPLANT
OIL CARTRIDGE MAESTRO DRILL (MISCELLANEOUS) ×3
PACK LAMINECTOMY NEURO (CUSTOM PROCEDURE TRAY) ×3 IMPLANT
ROD 5.5X30MM (Rod) ×3 IMPLANT
ROD RADIUS 35MM (Rod) ×3 IMPLANT
SCREW 5.75X45MM (Screw) ×12 IMPLANT
SPONGE SURGIFOAM ABS GEL 100 (HEMOSTASIS) ×3 IMPLANT
STRIP CLOSURE SKIN 1/2X4 (GAUZE/BANDAGES/DRESSINGS) ×4 IMPLANT
SUT VIC AB 0 CT1 18XCR BRD8 (SUTURE) ×1 IMPLANT
SUT VIC AB 0 CT1 8-18 (SUTURE) ×2
SUT VIC AB 2-0 CT1 18 (SUTURE) ×3 IMPLANT
SUT VIC AB 3-0 SH 8-18 (SUTURE) ×6 IMPLANT
TOWEL GREEN STERILE (TOWEL DISPOSABLE) ×3 IMPLANT
TOWEL GREEN STERILE FF (TOWEL DISPOSABLE) ×3 IMPLANT
TRAY FOLEY MTR SLVR 16FR STAT (SET/KITS/TRAYS/PACK) ×3 IMPLANT
WATER STERILE IRR 1000ML POUR (IV SOLUTION) ×3 IMPLANT

## 2018-06-06 NOTE — Op Note (Signed)
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  Bilateral L4-L5 ponte osteotomies for sagittal plane balance restoration  L4-5 posterior lumbar interbody fusion utilizing interbody cages and locally harvested autograft  L4-5 posterior lateral arthrodesis utilizing nonsegmental pedicle screw fixation and local autografting  Surgeon:Karnell Vanderloop A.Travoris Bushey, M.D.  Asst. Surgeon: Reinaldo Meeker, NP  Anesthesia: General  Indication: 77 year old female with severe back and bilateral lower extremity pain failing conservative management.  Work-up demonstrates evidence for grade 2 L4-5 degenerative spondylolisthesis with critical spinal stenosis and a superimposed large central calcified disc herniation.  Patient presents now for lumbar decompression and fusion in hopes of improving her symptoms.  Operative note: After induction anesthesia, patient position prone onto Wilson frame and properly padded.  Lumbar region prepped and draped sterilely.  Incision made overlying L4-5.  Dissection performed bilaterally.  Retractor placed.  Fluoroscopy used.  Levels confirmed.  Decompressive laminotomies and facetectomies were then performed using Leksell Camille Bal and high-speed drill to remove the inferior two thirds the lamina of L4 the entire inferior facet and pars interarticularis of L4 bilaterally the majority the superior facet of L5 bilaterally and the superior rim of the L5 lamina.  Facetectomies were completed in such a way to perform a ponte osteotomy which allowed for good decompression and sagittal plane balance restoration.  Ligament flavum elevated and resected.  Foraminotomies completed on the course exiting  L4 and L5 nerve roots.  Bilateral discectomies then performed.  Central calcified disc herniation was resected.  Dissipates then prepared for interbody fusion.  The space progressively distracted.  With a 10 mm distractor placed in patient's right side the space was prepared on the left side.  Soft tissue was removed the interspace.  A 9 mm [employer] expandable cage packed with locally harvested autograft was then impacted in the place.  Is then expanded to its full extent.  Disc patient then prepared on the other side.  Soft tissue was removed from the interspace.  Morselized autograft was packed into the interspace.  A second 9 mm cage was impacted into place and expanded to its full extent.  Pedicles at L4 and L5 were then did file using surface landmarks and intraoperative fluoroscopy.  Superficial bone overlying the pedicle was then removed using high-speed drill.  Pedicle was then probed using a pedicle all each pedicle all track was then probed and found to be solidly within the bone.  Each pedicle all track was then tapped with a screw tapped and then 5.74mm radius brand screws from [employer] were placed bilaterally at L4 and L5.  Final images reveal good position of the cages and the screws at the proper upper level with normal alignment of the spine.  Wound is then irrigated one final time.  Transverse processes and residual facets were decorticated.  Morselized autograft was packed posterior laterally as were 5 cc magna fuse allograft packets.  Short segment titanium rod placed over the screw heads at L4 and L5.  Locking caps placed of the screw for locking caps and engaged with a construct under compression.  Gelfoam was placed over the laminotomy defects.  Vancomycin powder was placed in the deep wound space.  Wounds and closed in layers of Vicryl sutures.  Steri-Strips and sterile dressing were applied.  No apparent complications.  Patient tolerated the procedure well and she returns to the  recovery room postop.

## 2018-06-06 NOTE — Transfer of Care (Signed)
Immediate Anesthesia Transfer of Care Note  Patient: Yvonne [name]  Procedure(s) Performed: POSTERIOR LUMBAR INTERBODY FUSION - LUMBAR FOUR-LUMBAR FIVE - Posterior Lateral and Interbody fusion (N/A Back)  Patient Location: PACU  Anesthesia Type:General  Level of Consciousness: drowsy  Airway & Oxygen Therapy: Patient Spontanous Breathing and Patient connected to face mask oxygen  Post-op Assessment: Report given to RN, Post -op Vital signs reviewed and stable and Patient moving all extremities X 4  Post vital signs: Reviewed and stable  Last Vitals:  Vitals Value Taken Time  BP 161/72 06/06/2018 12:24 PM  Temp    Pulse 79 06/06/2018 12:27 PM  Resp 14 06/06/2018 12:27 PM  SpO2 99 % 06/06/2018 12:27 PM  Vitals shown include unvalidated device data.  Last Pain:  Vitals:   06/06/18 0816  TempSrc: Oral  PainSc:          Complications: No apparent anesthesia complications

## 2018-06-06 NOTE — Progress Notes (Signed)
Received pt. Into 3W-35 from PACU. Pt. Alert and oriented x4. Denies Pain. All belongings at bedside. Pt. Oriented to unit. Nurse will continue to monitor.

## 2018-06-06 NOTE — Anesthesia Procedure Notes (Signed)
Procedure Name: Intubation Date/Time: 06/06/2018 9:44 AM Performed by: Colin Benton, CRNA Pre-anesthesia Checklist: Patient identified, Emergency Drugs available, Suction available and Patient being monitored Patient Re-evaluated:Patient Re-evaluated prior to induction Oxygen Delivery Method: Circle system utilized Preoxygenation: Pre-oxygenation with 100% oxygen Induction Type: IV induction Ventilation: Mask ventilation without difficulty and Oral airway inserted - appropriate to patient size Laryngoscope Size: Sabra Heck and 2 Grade View: Grade I Tube type: Oral Tube size: 7.0 mm Number of attempts: 1 Airway Equipment and Method: Stylet Placement Confirmation: ETT inserted through vocal cords under direct vision,  positive ETCO2 and breath sounds checked- equal and bilateral Secured at: 22 cm Tube secured with: Tape Dental Injury: Teeth and Oropharynx as per pre-operative assessment

## 2018-06-06 NOTE — Brief Op Note (Signed)
06/06/2018  12:03 PM  PATIENT:  Yvonne [name]  77 y.o. female  PRE-OPERATIVE DIAGNOSIS:  Spondylolisthesis  POST-OPERATIVE DIAGNOSIS:  Spondylolisthesis  PROCEDURE:  Procedure(s): POSTERIOR LUMBAR INTERBODY FUSION - LUMBAR FOUR-LUMBAR FIVE - Posterior Lateral and Interbody fusion (N/A)  SURGEON:  Surgeon(s) and Role:    * Earnie Larsson, MD - Primary  PHYSICIAN ASSISTANT:   ASSISTANTSReinaldo Meeker, NP   ANESTHESIA:   general  EBL:  200 mL   BLOOD ADMINISTERED:none  DRAINS: none   LOCAL MEDICATIONS USED:  MARCAINE     SPECIMEN:  No Specimen  DISPOSITION OF SPECIMEN:  N/A  COUNTS:  YES  TOURNIQUET:  * No tourniquets in log *  DICTATION: .Dragon Dictation  PLAN OF CARE: Admit to inpatient   PATIENT DISPOSITION:  PACU - hemodynamically stable.   Delay start of Pharmacological VTE agent (>24hrs) due to surgical blood loss or risk of bleeding: yes

## 2018-06-06 NOTE — Evaluation (Signed)
Physical Therapy Evaluation Patient Details Name: Yvonne [name] MRN: [id number] DOB: 01/19/1941 Today's Date: 06/06/2018   History of Present Illness  Pt is a 77 y/o female s/p PLIF L4-L5. PMH including but not limited to HTN, CVA and L THA in 2018.  Clinical Impression  Pt presented supine in bed with HOB elevated, awake and willing to participate in therapy session. Prior to admission, pt ambulated with use of a cane. Pt lives alone and reported that she has no one that can assist her upon d/c. Pt currently requires min-mod A for bed mobility and min A for transfers. PT reviewed 3/3 back precautions with pt throughout session. Pt would continue to benefit from skilled physical therapy services at this time while admitted and after d/c to address the below listed limitations in order to improve overall safety and independence with functional mobility.     Follow Up Recommendations SNF    Equipment Recommendations  None recommended by PT    Recommendations for Other Services       Precautions / Restrictions Precautions Precautions: Back;Fall Precaution Booklet Issued: No Precaution Comments: PT reviewed 3/3 back precautions with pt throughout as well as cueing for log roll technique Required Braces or Orthoses: Spinal Brace Spinal Brace: Lumbar corset;Applied in sitting position Restrictions Weight Bearing Restrictions: No      Mobility  Bed Mobility Overal bed mobility: Needs Assistance Bed Mobility: Rolling;Sidelying to Sit;Sit to Sidelying Rolling: Min assist Sidelying to sit: Mod assist     Sit to sidelying: Min assist General bed mobility comments: increased time and effort, multimodal cueing for sequencing and log roll technique, assist with trunk elevation and for bilateral LE management  Transfers Overall transfer level: Needs assistance Equipment used: Rolling walker (2 wheeled) Transfers: Sit to/from [facility] Sit to Stand: Min  assist Stand pivot transfers: Min assist       General transfer comment: increased time and effort, cueing for safe hand placement, assist for stability and safety  Ambulation/Gait             General Gait Details: pt able to take a few lateral steps at EOB with min-mod A; pt very unsafe and requiring multimodal cueing throughout  Stairs            Wheelchair Mobility    Modified Rankin (Stroke Patients Only)       Balance Overall balance assessment: Needs assistance Sitting-balance support: Feet supported Sitting balance-Leahy Scale: Fair     Standing balance support: No upper extremity supported Standing balance-Leahy Scale: Poor Standing balance comment: min A or UE supports needed                             Pertinent Vitals/Pain Pain Assessment: Faces Faces Pain Scale: Hurts whole lot Pain Location: back Pain Descriptors / Indicators: Grimacing;Guarding Pain Intervention(s): Monitored during session;Repositioned    Home Living Family/patient expects to be discharged to:: Skilled nursing facility                      Prior Function Level of Independence: Independent with assistive device(s)         Comments: ambulated with cane     Hand Dominance        Extremity/Trunk Assessment   Upper Extremity Assessment Upper Extremity Assessment: Overall WFL for tasks assessed    Lower Extremity Assessment Lower Extremity Assessment: Generalized weakness    Cervical / Trunk Assessment  Cervical / Trunk Assessment: Other exceptions Cervical / Trunk Exceptions: s/p lumbar sx  Communication   Communication: No difficulties  Cognition Arousal/Alertness: Awake/alert Behavior During Therapy: Anxious;Impulsive Overall Cognitive Status: Impaired/Different from baseline Area of Impairment: Attention;Memory;Following commands;Safety/judgement;Awareness;Problem solving                   Current Attention Level:  Sustained Memory: Decreased recall of precautions;Decreased short-term memory Following Commands: Follows one step commands inconsistently Safety/Judgement: Decreased awareness of safety;Decreased awareness of deficits Awareness: Intellectual Problem Solving: Slow processing;Decreased initiation;Difficulty sequencing;Requires verbal cues;Requires tactile cues        General Comments      Exercises     Assessment/Plan    PT Assessment Patient needs continued PT services  PT Problem List Decreased strength;Decreased activity tolerance;Decreased coordination;Decreased balance;Decreased mobility;Decreased cognition;Decreased knowledge of use of DME;Decreased safety awareness;Decreased knowledge of precautions;Pain       PT Treatment Interventions DME instruction;Gait training;Stair training;Therapeutic activities;Therapeutic exercise;Functional mobility training;Balance training;Neuromuscular re-education;Cognitive remediation;Patient/family education    PT Goals (Current goals can be found in the Care Plan section)  Acute Rehab PT Goals Patient Stated Goal: go to rehab PT Goal Formulation: With patient Time For Goal Achievement: 06/20/18 Potential to Achieve Goals: Good    Frequency Min 5X/week   Barriers to discharge        Co-evaluation               AM-PAC PT "6 Clicks" Mobility  Outcome Measure Help needed turning from your back to your side while in a flat bed without using bedrails?: A Little Help needed moving from lying on your back to sitting on the side of a flat bed without using bedrails?: A Lot Help needed moving to and from a bed to a chair (including a wheelchair)?: A Little Help needed standing up from a chair using your arms (e.g., wheelchair or bedside chair)?: A Little Help needed to walk in hospital room?: A Lot Help needed climbing 3-5 steps with a railing? : A Lot 6 Click Score: 15    End of Session Equipment Utilized During Treatment: Gait  belt Activity Tolerance: Patient limited by pain;Patient limited by fatigue Patient left: in bed;with call bell/phone within reach;with bed alarm set Nurse Communication: Mobility status PT Visit Diagnosis: Other abnormalities of gait and mobility (R26.89);Pain Pain - part of body: (back)    Time: 8413-2440 PT Time Calculation (min) (ACUTE ONLY): 15 min   Charges:   PT Evaluation $PT Eval Moderate Complexity: 1 Mod          Sherie Don, PT, DPT  Acute Rehabilitation Services Pager [phone] Office [place] 06/06/2018, 4:40 PM

## 2018-06-06 NOTE — H&P (Signed)
Yvonne [name] is an 77 y.o. female.   Chief Complaint: Leg pain and weakness HPI: 77 year old female with progressive back pain with radiation to both lower extremities.  Patient with associated numbness and weakness.  Patient with severe symptoms of radiculopathy and neurogenic claudication.  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  Patient presents now for decompression and fusion surgery.  Past Medical History:  Diagnosis Date  . Allergy   . Anxiety   . Aortic stenosis    mild by 07/2011 echo   . Arthritis   . Heart murmur   . Hypertension   . Hypothyroidism   . Impaired functional mobility, balance, gait, and endurance 09/02/2015  . Mild cognitive impairment with memory loss 09/02/2015  . Stroke [facility])    pt was 40  . Thyroid disease     Past Surgical History:  Procedure Laterality Date  . ABDOMINAL HYSTERECTOMY    . BRAIN SURGERY     age 70's  . TOE AMPUTATION  2013   2nd toe on right foot, hammer toe  . TONSILLECTOMY    . 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 Thyroid disease Mother   . Heart disease Father    Social History:  reports that she has never smoked. She has never used smokeless tobacco. She reports that she does not drink alcohol or use drugs.  Allergies: No Known Allergies  Medications Prior to Admission  Medication Sig Dispense Refill  . amitriptyline (ELAVIL) 50 MG tablet TAKE 3 TABLETS AT BEDTIME (Patient taking differently: Take 150 mg by mouth at bedtime. ) 270 tablet 0  . amLODipine (NORVASC) 5 MG tablet Take 1 tablet (5 mg total) by mouth daily. 90 tablet 3  . aspirin 81 MG chewable tablet Chew 81 mg by mouth 2 (two) times daily.    Marland Kitchen atorvastatin (LIPITOR) 10 MG tablet Take 1  tablet (10 mg total) by mouth daily. 90 tablet 3  . benazepril (LOTENSIN) 40 MG tablet Take 1 tablet (40 mg total) by mouth daily. 90 tablet 3  . gabapentin (NEURONTIN) 100 MG capsule Take 1 capsule (100 mg total) by mouth 3 (three) times daily. 270 capsule 2  . Glucosamine-Chondroitin (OSTEO BI-FLEX REGULAR STRENGTH PO) Take 2 tablets by mouth daily.    . hydrochlorothiazide (HYDRODIURIL) 25 MG tablet Take 1 tablet (25 mg total) by mouth daily. 90 tablet 3  . levothyroxine (SYNTHROID, LEVOTHROID) 75 MCG tablet Take 1 tablet (75 mcg total) by mouth daily. 90 tablet 3  . Multiple Vitamin (MULTIVITAMIN WITH MINERALS) TABS tablet Take 1 tablet by mouth daily with breakfast.    . omega-3 acid ethyl esters (LOVAZA) 1 g capsule TAKE 1 CAPSULE TWICE DAILY (Patient taking differently: Take 1 g by mouth 2 (two) times daily. ) 180 capsule 3  . lidocaine-prilocaine (EMLA) cream Apply up to 2 grams three times a day as needed for pain. (Patient not taking: Reported on 05/28/2018) 30 g 3    No results found for this or any previous visit (from the past 48 hour(s)). No results found.  Pertinent items noted in HPI and remainder of comprehensive ROS otherwise negative.  Blood pressure (!) 133/50, pulse 63, temperature 98.1 F (36.7 C), temperature source Oral, resp. rate 18, height 5'  1" (1.549 m), weight 67 kg, SpO2 95 %.  Patient is awake and alert.  She is oriented and appropriate.  Speech is fluent.  Judgment insight are intact.  Cranial nerve function normal bilaterally.  Motor examination her upper extremities normal.  Motor examination of her lower extremities with decreased dorsiflexion strength grading out at 4-/5 bilaterally.  Patient with some mild wasting of her quadriceps muscles bilaterally with 4+/5 strength bilaterally.  Remainder motor exams intact.  Sensory examination with decreased sensation to pinprick and light touch from L4 distally.  Reflexes are hypoactive in both patellae and absent in  both Achilles.  Gait is antalgic.  Posture is flexed.  Examination head ears eyes nose throat is unremarkable her chest and abdomen are benign.  Extremities are free from injury or deformity. 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  Plan L4-5 bilateral decompressive laminotomies and foraminotomies followed by posterior lumbar interbody fusion utilizing interbody cages, local harvested autograft, and augmented with posterior arthrodesis utilizing nonsegmental pedicle screw fixation and local autografting.  Risks and benefits of been explained.  Patient wishes to proceed.  Mallie Mussel A Aalyiah Camberos 06/06/2018, 9:21 AM

## 2018-06-06 NOTE — Anesthesia Procedure Notes (Signed)
Arterial Line Insertion Start/End12/20/2019 9:20 AM, 06/06/2018 9:23 AM Performed by: Kathryne Hitch, CRNA, CRNA  Patient location: Pre-op. Preanesthetic checklist: patient identified, IV checked, site marked, risks and benefits discussed, surgical consent, monitors and equipment checked, pre-op evaluation, timeout performed and anesthesia consent Lidocaine 1% used for infiltration Left, radial was placed Catheter size: 20 G Hand hygiene performed , maximum sterile barriers used  and Seldinger technique used Allen's test indicative of satisfactory collateral circulation Attempts: 1 Procedure performed without using ultrasound guided technique. Following insertion, dressing applied and Biopatch. Post procedure assessment: normal and unchanged  Patient tolerated the procedure well with no immediate complications.

## 2018-06-06 NOTE — Progress Notes (Signed)
Postop check.  Patient awake and alert.  She is oriented and reasonably appropriate.  Back and lower extremity pain improved from preop.  Good strength and sensation bilaterally.  Wound clean and dry.  Abdomen soft.  Doing well postop.  Begin mobilization.

## 2018-06-06 NOTE — Anesthesia Postprocedure Evaluation (Signed)
Anesthesia Post Note  Patient: ALNISA HASLEY  Procedure(s) Performed: POSTERIOR LUMBAR INTERBODY FUSION - LUMBAR FOUR-LUMBAR FIVE - Posterior Lateral and Interbody fusion (N/A Back)     Patient location during evaluation: PACU Anesthesia Type: General Level of consciousness: awake Pain management: pain level controlled Vital Signs Assessment: post-procedure vital signs reviewed and stable Respiratory status: spontaneous breathing Cardiovascular status: stable Anesthetic complications: no    Last Vitals:  Vitals:   06/06/18 1413 06/06/18 1424  BP:  (!) 109/56  Pulse: 82 82  Resp: 11 14  Temp:  (!) 36.4 C  SpO2: 93% 95%    Last Pain:  Vitals:   06/06/18 1424  TempSrc:   PainSc: 4     LLE Motor Response: Purposeful movement (06/06/18 1424) LLE Sensation: Numbness (06/06/18 1424) RLE Motor Response: Purposeful movement (06/06/18 1424) RLE Sensation: Numbness (06/06/18 1424)      Kaicee Scarpino

## 2018-06-06 NOTE — Anesthesia Preprocedure Evaluation (Signed)
Anesthesia Evaluation  Patient identified by MRN, date of birth, ID band Patient awake    Reviewed: Allergy & Precautions, NPO status , Patient's Chart, lab work & pertinent test results  Airway Mallampati: II  TM Distance: >3 FB     Dental   Pulmonary neg pulmonary ROS,    breath sounds clear to auscultation       Cardiovascular hypertension, + Valvular Problems/Murmurs  Rhythm:Regular Rate:Normal     Neuro/Psych    GI/Hepatic negative GI ROS, Neg liver ROS,   Endo/Other  Hypothyroidism   Renal/GU      Musculoskeletal  (+) Arthritis ,   Abdominal   Peds  Hematology  (+) anemia ,   Anesthesia Other Findings   Reproductive/Obstetrics                             Anesthesia Physical Anesthesia Plan  ASA: III  Anesthesia Plan: General   Post-op Pain Management:    Induction: Intravenous  PONV Risk Score and Plan: 3 and Ondansetron, Dexamethasone, Midazolam and Treatment may vary due to age or medical condition  Airway Management Planned: Oral ETT  Additional Equipment: Arterial line  Intra-op Plan:   Post-operative Plan: Possible Post-op intubation/ventilation  Informed Consent: I have reviewed the patients History and Physical, chart, labs and discussed the procedure including the risks, benefits and alternatives for the proposed anesthesia with the patient or authorized representative who has indicated his/her understanding and acceptance.   Dental advisory given  Plan Discussed with: CRNA and Anesthesiologist  Anesthesia Plan Comments:         Anesthesia Quick Evaluation

## 2018-06-07 ENCOUNTER — Encounter (HOSPITAL_COMMUNITY): Payer: Self-pay

## 2018-06-07 NOTE — Progress Notes (Signed)
Subjective: Patient reports upset with bed alarm  Objective: Vital signs in last 24 hours: Temp:  [98 F (36.7 C)-98.4 F (36.9 C)] 98.4 F (36.9 C) (12/21 1134) Pulse Rate:  [82-101] 90 (12/21 1134) Resp:  [15-16] 16 (12/21 1134) BP: (95-137)/(52-66) 95/56 (12/21 1134) SpO2:  [89 %-98 %] 95 % (12/21 1134)  Intake/Output from previous day: 12/20 0701 - 12/21 0700 In: 1280 [P.O.:240; I.V.:940; IV Piggyback:100] Out: 585 [Urine:385; Blood:200] Intake/Output this shift: No intake/output data recorded.  Physical Exam: Walking with PT, but only a few steps. Dressing CDI.  Lab Results: No results for input(s): WBC, HGB, HCT, PLT in the last 72 hours. BMET No results for input(s): NA, K, CL, CO2, GLUCOSE, BUN, CREATININE, CALCIUM in the last 72 hours.  Studies/Results: Dg Lumbar Spine 2-3 Views  Result Date: 06/06/2018 CLINICAL DATA:  Posterior L4-5 fusion EXAM: DG C-ARM 61-120 MIN; LUMBAR SPINE - 2-3 VIEW COMPARISON:  CT 04/29/2018 FINDINGS: Intraoperative spot images demonstrate localization instruments posterior to the L5 pedicles followed by posterior fusion changes and pedicle placement at L4 and L5. No hardware bony complicating feature. IMPRESSION: Intraoperative imaging as above. Electronically Signed   By: Rolm Baptise M.D.   On: 06/06/2018 11:47   Dg C-arm 1-60 Min  Result Date: 06/06/2018 CLINICAL DATA:  Posterior L4-5 fusion EXAM: DG C-ARM 61-120 MIN; LUMBAR SPINE - 2-3 VIEW COMPARISON:  CT 04/29/2018 FINDINGS: Intraoperative spot images demonstrate localization instruments posterior to the L5 pedicles followed by posterior fusion changes and pedicle placement at L4 and L5. No hardware bony complicating feature. IMPRESSION: Intraoperative imaging as above. Electronically Signed   By: Rolm Baptise M.D.   On: 06/06/2018 11:47    Assessment/Plan: Intermittent confusion.  Working with PT.      LOS: 1 day    Peggyann Shoals, MD 06/07/2018, 3:53 PM

## 2018-06-07 NOTE — Progress Notes (Signed)
BP 97/54, Hold bp meds per MD. Will continue to monitor

## 2018-06-07 NOTE — Progress Notes (Addendum)
Physical Therapy Treatment Patient Details Name: TANETTA FUHRIMAN MRN: [id number] DOB: 07/11/1940 Today's Date: 06/07/2018    History of Present Illness Pt is a 77 y/o female s/p PLIF L4-L5. PMH including but not limited to HTN, CVA and L THA in 2018.    PT Comments    Pt making fair progress with functional mobility. She remains very impulsive and requires min-minA x2 overall for functional mobility with frequent cueing to maintain back precautions. Pt tolerated ambulating in hallway with RW and min A x2 for safety. Pt would continue to benefit from skilled physical therapy services at this time while admitted and after d/c to address the below listed limitations in order to improve overall safety and independence with functional mobility.  Of note pt's BP was assessed throughout: BP supine at beginning of session = 148/68 mmHg BP sitting initially = 112/61 mmHg BP sitting after ~3 mins = 136/71 mmHg   Follow Up Recommendations  SNF     Equipment Recommendations  None recommended by PT    Recommendations for Other Services       Precautions / Restrictions Precautions Precautions: Back;Fall Precaution Comments: PT reviewed 3/3 back precautions with pt throughout as well as cueing for log roll technique Required Braces or Orthoses: Spinal Brace Spinal Brace: Lumbar corset;Applied in sitting position Restrictions Weight Bearing Restrictions: No    Mobility  Bed Mobility Overal bed mobility: Needs Assistance Bed Mobility: Rolling;Sidelying to Sit Rolling: Min guard Sidelying to sit: Min assist       General bed mobility comments: increased time and effort, cueing for log roll technique, assistance needed for trunk elevation  Transfers Overall transfer level: Needs assistance Equipment used: Rolling walker (2 wheeled) Transfers: Sit to/from [facility] Sit to Stand: Min assist;+2 safety/equipment Stand pivot transfers: Min assist;+2 safety/equipment        General transfer comment: pt very impulsive, requiring two people for safety, min A for stability  Ambulation/Gait Ambulation/Gait assistance: Min assist;+2 safety/equipment Gait Distance (Feet): 50 Feet Assistive device: Rolling walker (2 wheeled) Gait Pattern/deviations: Step-through pattern Gait velocity: decreased   General Gait Details: pt with modest instability throughout required min A for safety and stability   Stairs             Wheelchair Mobility    Modified Rankin (Stroke Patients Only)       Balance Overall balance assessment: Needs assistance Sitting-balance support: Feet supported Sitting balance-Leahy Scale: Fair     Standing balance support: Bilateral upper extremity supported;No upper extremity supported;Single extremity supported Standing balance-Leahy Scale: Poor Standing balance comment: min A or UE supports needed                            Cognition Arousal/Alertness: Awake/alert Behavior During Therapy: Anxious;Impulsive Overall Cognitive Status: Impaired/Different from baseline Area of Impairment: Attention;Memory;Following commands;Safety/judgement;Awareness;Problem solving                   Current Attention Level: Sustained Memory: Decreased recall of precautions;Decreased short-term memory Following Commands: Follows one step commands inconsistently Safety/Judgement: Decreased awareness of safety;Decreased awareness of deficits Awareness: Intellectual Problem Solving: Difficulty sequencing;Requires verbal cues;Requires tactile cues        Exercises      General Comments        Pertinent Vitals/Pain Pain Assessment: Faces Faces Pain Scale: Hurts whole lot Pain Location: back Pain Descriptors / Indicators: Grimacing;Guarding Pain Intervention(s): Monitored during session;Repositioned;Patient requesting pain meds-RN notified  Home Living                      Prior Function             PT Goals (current goals can now be found in the care plan section) Acute Rehab PT Goals PT Goal Formulation: With patient Time For Goal Achievement: 06/20/18 Potential to Achieve Goals: Good Progress towards PT goals: Progressing toward goals    Frequency    Min 5X/week      PT Plan Current plan remains appropriate    Co-evaluation PT/OT/SLP Co-Evaluation/Treatment: Yes Reason for Co-Treatment: To address functional/ADL transfers;For patient/therapist safety          AM-PAC PT "6 Clicks" Mobility   Outcome Measure  Help needed turning from your back to your side while in a flat bed without using bedrails?: None Help needed moving from lying on your back to sitting on the side of a flat bed without using bedrails?: A Little Help needed moving to and from a bed to a chair (including a wheelchair)?: A Little Help needed standing up from a chair using your arms (e.g., wheelchair or bedside chair)?: A Little Help needed to walk in hospital room?: A Little Help needed climbing 3-5 steps with a railing? : A Lot 6 Click Score: 18    End of Session Equipment Utilized During Treatment: Gait belt;Back brace Activity Tolerance: Patient tolerated treatment well Patient left: in chair;with call bell/phone within reach;with chair alarm set Nurse Communication: Mobility status PT Visit Diagnosis: Other abnormalities of gait and mobility (R26.89);Pain Pain - part of body: (back)     Time: 0981-1914 PT Time Calculation (min) (ACUTE ONLY): 30 min  Charges:  $Gait Training: 8-22 mins                     Sherie Don, [place], DPT  Acute Rehabilitation Services Pager [phone] Office [place] 06/07/2018, 4:16 PM

## 2018-06-07 NOTE — Evaluation (Signed)
Occupational Therapy Evaluation Patient Details Name: Yvonne [name] MRN: [id number] DOB: 30-Nov-1940 Today's Date: 06/07/2018    History of Present Illness Pt is a 77 y/o female s/p PLIF L4-L5. PMH including but not limited to HTN, CVA and L THA in 2018.   Clinical Impression   Patient is s/p PLIF L4-5 surgery resulting in the deficits listed below (see OT Problem List). The patient is a poor historian for PLOF. The patient reported they live in a home with 3 steps to enter, used a cane or  four wheeled walker and had an aide who came in. She reported she has a son that lives in the area.  Patient will benefit from skilled OT to increase their safety and independence with ADL and functional mobility for ADL (while adhering to their precautions) to facilitate discharge to venue listed below. The patient requires min assist to min assist x2 with rolling walker due to impulsivity with constant cues for precautions. The patient is unaware of precautions but know they need to use there brace.   Of note pt's BP was assessed throughout: BP supine at beginning of session = 148/68 mmHg BP sitting initially = 112/61 mmHg BP sitting after ~3 mins = 136/71 mmHg      Follow Up Recommendations  SNF;Supervision/Assistance - 24 hour    Equipment Recommendations       Recommendations for Other Services       Precautions / Restrictions Precautions Precautions: Back;Fall Precaution Booklet Issued: No Precaution Comments: Reviewde precations but decrease ability to attend to therapsi Required Braces or Orthoses: Spinal Brace Spinal Brace: Lumbar corset;Applied in sitting position Restrictions Weight Bearing Restrictions: No      Mobility Bed Mobility Overal bed mobility: Needs Assistance Bed Mobility: Rolling;Sidelying to Sit Rolling: Min guard Sidelying to sit: Min assist     Sit to sidelying: Min assist General bed mobility comments: increased time and effort, cueing for log roll  technique, assistance needed for trunk elevation  Transfers Overall transfer level: Needs assistance Equipment used: Rolling walker (2 wheeled) Transfers: Sit to/from [facility] Sit to Stand: Min assist;+2 safety/equipment Stand pivot transfers: Min assist;+2 safety/equipment       General transfer comment: pt very impulsive, requiring two people for safety, min A for stability    Balance Overall balance assessment: Needs assistance Sitting-balance support: Feet supported Sitting balance-Leahy Scale: Fair     Standing balance support: Bilateral upper extremity supported;No upper extremity supported;Single extremity supported Standing balance-Leahy Scale: Poor Standing balance comment: min A or UE supports needed                           ADL either performed or assessed with clinical judgement   ADL Overall ADL's : Needs assistance/impaired Eating/Feeding: Modified independent   Grooming: Wash/dry hands;Cueing for safety;Cueing for compensatory techniques;Min guard   Upper Body Bathing: Min guard   Lower Body Bathing: Moderate assistance;Cueing for sequencing;Cueing for back precautions;Cueing for safety   Upper Body Dressing : Min guard;Sitting;Cueing for sequencing;Cueing for safety   Lower Body Dressing: Moderate assistance;Cueing for back precautions;Sit to/from stand   Toilet Transfer: Moderate assistance;Cueing for sequencing;Cueing for safety   Toileting- Clothing Manipulation and Hygiene: Moderate assistance;Cueing for sequencing;Cueing for back precautions   Tub/ Shower Transfer: Moderate assistance;Adhering to back precautions   Functional mobility during ADLs: Minimal assistance;Rolling walker General ADL Comments: very impulsive and does not follow precautions     Vision  Perception     Praxis      Pertinent Vitals/Pain Pain Assessment: 0-10 Pain Score: 8  Faces Pain Scale: Hurts whole lot Pain Location:  back Pain Descriptors / Indicators: Grimacing;Guarding Pain Intervention(s): Monitored during session;Patient requesting pain meds-RN notified     Hand Dominance     Extremity/Trunk Assessment Upper Extremity Assessment Upper Extremity Assessment: Overall WFL for tasks assessed   Lower Extremity Assessment Lower Extremity Assessment: Defer to PT evaluation   Cervical / Trunk Assessment Cervical / Trunk Assessment: Other exceptions Cervical / Trunk Exceptions: s/p lumbar sx   Communication Communication Communication: No difficulties   Cognition Arousal/Alertness: Awake/alert Behavior During Therapy: Anxious;Impulsive Overall Cognitive Status: Impaired/Different from baseline Area of Impairment: Attention;Memory;Following commands;Safety/judgement;Awareness;Problem solving                   Current Attention Level: Sustained Memory: Decreased recall of precautions;Decreased short-term memory Following Commands: Follows one step commands inconsistently Safety/Judgement: Decreased awareness of safety;Decreased awareness of deficits Awareness: Intellectual Problem Solving: Difficulty sequencing;Requires verbal cues;Requires tactile cues     General Comments       Exercises     Shoulder Instructions      Home Living Family/patient expects to be discharged to:: Skilled nursing facility Living Arrangements: Alone                               Additional Comments: reports having aide prior at home      Prior Functioning/Environment Level of Independence: Independent with assistive device(s)        Comments: ambulated with cane or 4WW        OT Problem List: Decreased cognition;Decreased safety awareness;Decreased knowledge of use of DME or AE;Decreased knowledge of precautions;Pain;Decreased activity tolerance      OT Treatment/Interventions: Self-care/ADL training;DME and/or AE instruction;Therapeutic activities;Cognitive  remediation/compensation;Patient/family education    OT Goals(Current goals can be found in the care plan section) Acute Rehab OT Goals Patient Stated Goal: go to rehab OT Goal Formulation: With patient Time For Goal Achievement: 06/14/18 Potential to Achieve Goals: Good  OT Frequency: Min 2X/week   Barriers to D/C: Decreased caregiver support          Co-evaluation PT/OT/SLP Co-Evaluation/Treatment: Yes Reason for Co-Treatment: To address functional/ADL transfers;Other (comment)   OT goals addressed during session: ADL's and self-care      AM-PAC OT "6 Clicks" Daily Activity     Outcome Measure Help from another person eating meals?: None Help from another person taking care of personal grooming?: A Little Help from another person toileting, which includes using toliet, bedpan, or urinal?: A Little Help from another person bathing (including washing, rinsing, drying)?: A Lot Help from another person to put on and taking off regular upper body clothing?: A Little Help from another person to put on and taking off regular lower body clothing?: A Lot 6 Click Score: 17   End of Session Equipment Utilized During Treatment: Gait belt;Rolling walker  Activity Tolerance: Patient tolerated treatment well Patient left: in chair;with call bell/phone within reach;with chair alarm set  OT Visit Diagnosis: Pain;Unsteadiness on feet (R26.81) Pain - part of body: (back)                Time: 2536-6440 OT Time Calculation (min): 34 min Charges:  OT General Charges $OT Visit: 1 Visit OT Evaluation $OT Eval Low Complexity: [place] OTR/L  [place]  [phone] office number  [phone] pager number   Joeseph Amor 06/07/2018, 4:29 PM

## 2018-06-08 NOTE — Progress Notes (Signed)
Md ([place]lsner) notified of pts low BP (86/46). Pt slightly confused but otherwise asymptomatic. No new orders at this time. Will continue to monitor.

## 2018-06-08 NOTE — Progress Notes (Signed)
Physical Therapy Treatment Patient Details Name: Yvonne [name] MRN: [id number] DOB: 20-Feb-1941 Today's Date: 06/08/2018    History of Present Illness Pt is a 77 y/o female s/p PLIF L4-L5. PMH including but not limited to HTN, CVA and L THA in 2018.    PT Comments    Pt continues to have decreased safety awareness with impulsivity. Did increase her overall gait distance today with less assistance needed. Acute PT to continue during pt's hospital stay.   Follow Up Recommendations  SNF     Equipment Recommendations  None recommended by PT    Precautions / Restrictions Precautions Precautions: Back;Fall Precaution Comments: pt unable to recall any back precautions. reviewed all with her Required Braces or Orthoses: Spinal Brace Spinal Brace: Lumbar corset;Applied in sitting position;Other (comment)(pt had brace on upon arrival to room) Restrictions Weight Bearing Restrictions: No    Mobility  Bed Mobility               General bed mobility comments: pt in chair on arrival to room with chair alarm going off and RN in room  Transfers Overall transfer level: Needs assistance Equipment used: Rolling walker (2 wheeled) Transfers: Sit to/from Stand Sit to Stand: Min guard         General transfer comment: pt demo's good use of arms to stand with good weight shifting. moves fast, min guard for safety  Ambulation/Gait Ambulation/Gait assistance: Min guard Gait Distance (Feet): 90 Feet Assistive device: Rolling walker (2 wheeled) Gait Pattern/deviations: Step-through pattern;Decreased stride length Gait velocity: increased   General Gait Details: no balance loss noted with gait. cues to stay withing walker, for posture and to slow down for safety       Cognition Arousal/Alertness: Awake/alert Behavior During Therapy: Impulsive;Anxious Overall Cognitive Status: Impaired/Different from baseline Area of Impairment: Attention;Memory;Following  commands;Safety/judgement;Awareness;Problem solving                   Current Attention Level: Sustained Memory: Decreased recall of precautions;Decreased short-term memory Following Commands: Follows one step commands consistently;Follows multi-step commands inconsistently Safety/Judgement: Decreased awareness of safety;Decreased awareness of deficits Awareness: Intellectual Problem Solving: Difficulty sequencing;Requires verbal cues;Requires tactile cues         Pertinent Vitals/Pain Pain Assessment: 0-10 Faces Pain Scale: Hurts a little bit Pain Location: back Pain Descriptors / Indicators: Sore Pain Intervention(s): Limited activity within patient's tolerance;Monitored during session;Premedicated before session;Repositioned     PT Goals (current goals can now be found in the care plan section) Acute Rehab PT Goals Patient Stated Goal: go to rehab PT Goal Formulation: With patient Time For Goal Achievement: 06/20/18 Potential to Achieve Goals: Good Progress towards PT goals: Progressing toward goals    Frequency    Min 5X/week       AM-PAC PT "6 Clicks" Mobility   Outcome Measure    Help needed moving from lying on your back to sitting on the side of a flat bed without using bedrails?: A Little Help needed moving to and from a bed to a chair (including a wheelchair)?: A Little Help needed standing up from a chair using your arms (e.g., wheelchair or bedside chair)?: A Little Help needed to walk in hospital room?: A Little Help needed climbing 3-5 steps with a railing? : A Little 6 Click Score: 15    End of Session Equipment Utilized During Treatment: Gait belt;Back brace Activity Tolerance: Patient tolerated treatment well;No increased pain Patient left: in chair;with call bell/phone within reach;with chair alarm set Nurse Communication:  Mobility status PT Visit Diagnosis: Other abnormalities of gait and mobility (R26.89);Pain Pain - part of body:  (back)     Time: 1700-1749 PT Time Calculation (min) (ACUTE ONLY): 16 min  Charges:  $Gait Training: 8-22 mins                     Willow Ora, PTA, CLT Acute [employer] [phone] 06/08/18, 12:13 PM   Willow Ora 06/08/2018, 12:12 PM

## 2018-06-08 NOTE — Progress Notes (Signed)
Patient ID: Yvonne [name], female   DOB: 11/21/1940, 77 y.o.   MRN: [id number] Postop day 2 from laminectomy and osteotomies L4-L5 with fusion Vital signs are stable Patient notes a fair amount of pain in her low back She has been moving slowly and not independently quite yet She is requiring a fair amount of assistance Ambulation has been minimal yesterday and will hopefully increase today Not ready for discharge

## 2018-06-09 ENCOUNTER — Other Ambulatory Visit: Payer: Self-pay

## 2018-06-09 NOTE — Care Management Note (Signed)
Case Management Note  Patient Details  Name: RIONNA FELTES MRN: [id number] Date of Birth: 14-Jun-1941  Subjective/Objective:   Pt s/p lumbar fusion. She is from home alone.  DME: cane Pt denies issues obtaining her medications. Pt states she has been driving up to this.                  Action/Plan: Recommendations are for SNF rehab prior to returning home. CM met with the patient and she would like to go to [facility] for SNF rehab. FL2 completed and faxed to [facility].  CSW updated. CM following.  Expected Discharge Date:                  Expected Discharge Plan:  Skilled Nursing Facility  In-House Referral:  Clinical Social Work  Discharge planning Services  CM Consult  Post Acute Care Choice:    Choice offered to:     DME Arranged:    DME Agency:     HH Arranged:    [place] Agency:     Status of Service:  In process, will continue to follow  If discussed at Long Length of Stay Meetings, dates discussed:    Additional Comments:  Pollie Friar, RN 06/09/2018, 2:51 PM

## 2018-06-09 NOTE — NC FL2 (Signed)
[place] LEVEL OF CARE SCREENING TOOL     IDENTIFICATION  Patient Name: Yvonne [name] Birthdate: July 07, 1940 Sex: female Admission Date (Current Location): 06/06/2018  [facility] and [place] Number:  Herbalist and Address:  [facility], [place] [address] [place], [place] [address]      Provider Number: [id number]  Attending Physician Name and Address:  Earnie Larsson, MD  Relative Name and Phone Number:       Current Level of Care: Hospital Recommended Level of Care: [place] Prior Approval Number:    Date Approved/Denied:   PASRR Number: [id number]  Discharge Plan: SNF    Current Diagnoses: Patient Active Problem List   Diagnosis Date Noted  . Degenerative spondylolisthesis 06/06/2018  . Normochromic normocytic anemia 03/28/2017  . Hyperglycemia 03/28/2017  . Status post total replacement of left hip 03/19/2017  . Bilateral carotid artery disease ([place]) 02/04/2017  . Chronic left-sided low back pain without sciatica 01/30/2017  . Pain of left hip joint 01/30/2017  . Unilateral primary osteoarthritis, left hip 01/30/2017  . Mild cognitive impairment with memory loss 09/02/2015  . Impaired functional mobility, balance, gait, and endurance 09/02/2015  . Hearing impairment 09/02/2015  . 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 Skin nodule 09/13/2014  . Osteopenia 08/30/2014  . Diffuse pain 04/07/2014  . Numbness in both legs 10/16/2012  . Constipation 06/04/2012  . Hypercholesteremia 09/05/2011  . Osteoarthritis 04/12/2011  . 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 Hypothyroidism 07/12/2009  . ANXIETY 07/12/2009  . INSOMNIA, PERSISTENT 07/12/2009  . Essential hypertension 07/12/2009  . History of cerebrovascular accident 07/12/2009    Orientation RESPIRATION BLADDER Height & Weight     Self, Time, Situation, Place  Normal Continent Weight: 67 kg Height:  5\' 1"  (154.9 cm)  BEHAVIORAL  SYMPTOMS/MOOD NEUROLOGICAL BOWEL NUTRITION STATUS      Continent Diet(Regular with thin liquids)  AMBULATORY STATUS COMMUNICATION OF NEEDS Skin   Extensive Assist Verbally Surgical wounds(to back)                       Personal Care Assistance Level of Assistance  Bathing, Feeding, Dressing Bathing Assistance: Maximum assistance Feeding assistance: Independent Dressing Assistance: Limited assistance     Functional Limitations Info  Sight, Hearing, Speech Sight Info: Impaired(blind in rt eye) Hearing Info: Impaired Speech Info: Adequate    SPECIAL CARE FACTORS FREQUENCY  PT (By licensed PT), OT (By licensed OT)     PT Frequency: 5x/wk OT Frequency: 5x/wk            Contractures Contractures Info: Not present    Additional Factors Info  Code Status, Allergies, Psychotropic Code Status Info: full Allergies Info: NKA Psychotropic Info: Elavil 150 mg at bedtime/ Neurontin 100 mg three times a day         Current Medications (06/09/2018):  This is the current hospital active medication list Current Facility-Administered Medications  Medication Dose Route Frequency Provider Last Rate Last Dose  . 0.9 %  sodium chloride infusion  250 mL Intravenous Continuous Earnie Larsson, MD   Stopped at 06/07/18 2118  . acetaminophen (TYLENOL) tablet 650 mg  650 mg Oral Q4H PRN Earnie Larsson, MD       Or  . acetaminophen (TYLENOL) suppository 650 mg  650 mg Rectal Q4H PRN Earnie Larsson, MD      . amitriptyline (ELAVIL) tablet 150 mg  150 mg Oral QHS Earnie Larsson, MD  150 mg at 06/08/18 2205  . amLODipine (NORVASC) tablet 5 mg  5 mg Oral Daily Earnie Larsson, MD   Stopped at 06/09/18 [phone]  . aspirin chewable tablet 81 mg  81 mg Oral BID Earnie Larsson, MD   81 mg at 06/09/18 9604  . atorvastatin (LIPITOR) tablet 10 mg  10 mg Oral Daily Earnie Larsson, MD   10 mg at 06/09/18 5409  . benazepril (LOTENSIN) tablet 40 mg  40 mg Oral Daily Earnie Larsson, MD   Stopped at 06/09/18 0935  . bisacodyl  (DULCOLAX) suppository 10 mg  10 mg Rectal Daily PRN Earnie Larsson, MD      . diazepam (VALIUM) tablet 5-10 mg  5-10 mg Oral Q6H PRN Earnie Larsson, MD      . gabapentin (NEURONTIN) capsule 100 mg  100 mg Oral TID Earnie Larsson, MD   100 mg at 06/09/18 8119  . hydrochlorothiazide (HYDRODIURIL) tablet 25 mg  25 mg Oral Daily Earnie Larsson, MD   Stopped at 06/09/18 [phone]  . HYDROcodone-acetaminophen (NORCO) 10-325 MG per tablet 1 tablet  1 tablet Oral Q4H PRN Earnie Larsson, MD   1 tablet at 06/09/18 [phone]  . HYDROmorphone (DILAUDID) injection 1 mg  1 mg Intravenous Q2H PRN Earnie Larsson, MD      . levothyroxine (SYNTHROID, LEVOTHROID) tablet 75 mcg  75 mcg Oral Q0600 Earnie Larsson, MD   75 mcg at 06/09/18 0700  . menthol-cetylpyridinium (CEPACOL) lozenge 3 mg  1 lozenge Oral PRN Earnie Larsson, MD       Or  . phenol (CHLORASEPTIC) mouth spray 1 spray  1 spray Mouth/Throat PRN Earnie Larsson, MD      . multivitamin with minerals tablet 1 tablet  1 tablet Oral Q breakfast Earnie Larsson, MD   1 tablet at 06/09/18 0936  . omega-3 acid ethyl esters (LOVAZA) capsule 1 g  1 g Oral BID Earnie Larsson, MD   1 g at 06/09/18 0936  . ondansetron (ZOFRAN) tablet 4 mg  4 mg Oral Q6H PRN Earnie Larsson, MD       Or  . ondansetron (ZOFRAN) injection 4 mg  4 mg Intravenous Q6H PRN Earnie Larsson, MD      . oxyCODONE (Oxy IR/ROXICODONE) immediate release tablet 10 mg  10 mg Oral Q3H PRN Earnie Larsson, MD      . polyethylene glycol (MIRALAX / GLYCOLAX) packet 17 g  17 g Oral Daily PRN Earnie Larsson, MD      . sodium chloride flush (NS) 0.9 % injection 3 mL  3 mL Intravenous Hervey Ard, MD   3 mL at 06/08/18 2209  . sodium chloride flush (NS) 0.9 % injection 3 mL  3 mL Intravenous PRN Earnie Larsson, MD   3 mL at 06/06/18 2252  . sodium phosphate (FLEET) 7-19 GM/118ML enema 1 enema  1 enema Rectal Once PRN Earnie Larsson, MD         Discharge Medications: Please see discharge summary for a list of discharge medications.  Relevant Imaging  Results:  Relevant Lab Results:   Additional Information SS#: [id number]  Pollie Friar, RN

## 2018-06-09 NOTE — Clinical Social Work Note (Signed)
Clinical Social Work Assessment  Patient Details  Name: Yvonne [name] MRN: [id number] Date of Birth: 11-19-1940  Date of referral:  06/09/18               Reason for consult:  Facility Placement                Permission sought to share information with:    Permission granted to share information::     Name::        Agency::     Relationship::     Contact Information:     Housing/Transportation Living arrangements for the past 2 months:  Single Family Home Source of Information:  Adult Children(Son- Yvonne [name]) Patient Interpreter Needed:  None Criminal Activity/Legal Involvement Pertinent to Current Situation/Hospitalization:    Significant Relationships:  Adult Children Lives with:  Adult Children Do you feel safe going back to the place where you live?  No Need for family participation in patient care:  Yes (Comment)  Care giving concerns: No concerns voiced at this time.    Social Worker assessment / plan:  CSW spoke with patients son, Yvonne [name], via phone call regarding disposition plans. CSW informed son of PT's recommendation for SNF placement at discharge. Son is agreeable to SNF placement and prefers [facility]. Son states patient has been to facility in the past and has had a good experience with them. CSW will complete all needed information and follow up with [facility].   Employment status:  Retired Surveyor, [employer] Care PT Recommendations:  [place] / Referral to community resources:     Patient/Family's Response to care:  Son appreciated CSW.  Patient/Family's Understanding of and Emotional Response to Diagnosis, Current Treatment, and Prognosis:  Son understands current discharge plan to SNF- hoping for [facility].   Emotional Assessment Appearance:    Attitude/Demeanor/Rapport:    Affect (typically observed):  Unable to Assess Orientation:  Fluctuating Orientation (Suspected and/or reported  [place]und[place]) Alcohol / Substance use:  Never Used Psych involvement (Current and /or in the community):  No (Comment)  Discharge Needs  Concerns to be addressed:  No discharge needs identified Readmission within the last 30 days:  No Current discharge risk:  None Barriers to Discharge:  No Barriers Identified   Yvonne Anna, LCSW 06/09/2018, 4:05 PM

## 2018-06-09 NOTE — Progress Notes (Signed)
Postop day 3.  Patient overall doing reasonably well.  Back pain is controlled.  Her mobility is fair.  She is having no lower extremity pain.  She has some chronic bilateral feet numbness which is also improved from preop.  She is participating in therapy.  She remains mildly intermittently confused.  She has some intermittent difficulty with asymptomatic hypotension.  Her urine output is good.  Motor and sensory exam are intact.  Patient is progressing reasonably well.  She has a limited support at home.  I think she would benefit from short-term skilled nursing facility placement.

## 2018-06-09 NOTE — Progress Notes (Signed)
Physical Therapy Treatment Patient Details Name: Yvonne [name] MRN: [id number] DOB: 1941/05/23 Today's Date: 06/09/2018    History of Present Illness Pt is a 77 y/o female s/p PLIF L4-L5. PMH including but not limited to HTN, CVA and L THA in 2018.    PT Comments    Pt performed gait and functional mobility with min assistance.  Pt slow and guarded during session and require cues for safety.  Plan for SNF remains appropriate to improve strength and function before returning home.     Follow Up Recommendations  SNF     Equipment Recommendations  None recommended by PT    Recommendations for Other Services       Precautions / Restrictions Precautions Precautions: Back;Fall Precaution Booklet Issued: No Precaution Comments: pt unable to recall any back precautions. reviewed all with her Required Braces or Orthoses: Spinal Brace Spinal Brace: Lumbar corset;Applied in sitting position;Other (comment) Restrictions Weight Bearing Restrictions: No    Mobility  Bed Mobility Overal bed mobility: Needs Assistance Bed Mobility: Rolling;Sidelying to Sit Rolling: Min guard Sidelying to sit: Min assist       General bed mobility comments: Pt required min assist to elevate trunk into sitting.    Transfers Overall transfer level: Needs assistance Equipment used: Rolling walker (2 wheeled) Transfers: Sit to/from Stand Sit to Stand: Min assist Stand pivot transfers: Min assist;+2 safety/equipment       General transfer comment: Cues for hand placement to push into standing vs. pulling on RW for support.    Ambulation/Gait Ambulation/Gait assistance: Min guard Gait Distance (Feet): 115 Feet Assistive device: Rolling walker (2 wheeled) Gait Pattern/deviations: Step-through pattern;Decreased stride length;Trunk flexed Gait velocity: increased   General Gait Details: Cues for upper trunk control and Rw position.  pt pushing RW too far forward at times.     Stairs              Wheelchair Mobility    Modified Rankin (Stroke Patients Only)       Balance Overall balance assessment: Needs assistance Sitting-balance support: Feet supported Sitting balance-Leahy Scale: Fair     Standing balance support: Bilateral upper extremity supported;No upper extremity supported;Single extremity supported Standing balance-Leahy Scale: Poor                              Cognition Arousal/Alertness: Awake/alert Behavior During Therapy: Impulsive;Anxious Overall Cognitive Status: Impaired/Different from baseline Area of Impairment: Attention;Memory;Following commands;Safety/judgement;Awareness;Problem solving                   Current Attention Level: Sustained Memory: Decreased recall of precautions;Decreased short-term memory Following Commands: Follows one step commands consistently;Follows multi-step commands inconsistently Safety/Judgement: Decreased awareness of safety;Decreased awareness of deficits Awareness: Intellectual Problem Solving: Difficulty sequencing;Requires verbal cues;Requires tactile cues        Exercises      General Comments        Pertinent Vitals/Pain Pain Assessment: 0-10 Pain Score: 5  Pain Location: back Pain Descriptors / Indicators: Sore Pain Intervention(s): Monitored during session;Repositioned    Home Living                      Prior Function            PT Goals (current goals can now be found in the care plan section) Acute Rehab PT Goals Patient Stated Goal: go to rehab Potential to Achieve Goals: Good Progress towards PT goals: Progressing  toward goals    Frequency    Min 5X/week      PT Plan Current plan remains appropriate    Co-evaluation              AM-PAC PT "6 Clicks" Mobility   Outcome Measure  Help needed turning from your back to your side while in a flat bed without using bedrails?: None Help needed moving from lying on your back to sitting on  the side of a flat bed without using bedrails?: A Little Help needed moving to and from a bed to a chair (including a wheelchair)?: A Little Help needed standing up from a chair using your arms (e.g., wheelchair or bedside chair)?: A Little Help needed to walk in hospital room?: A Little Help needed climbing 3-5 steps with a railing? : A Little 6 Click Score: 19    End of Session Equipment Utilized During Treatment: Gait belt;Back brace Activity Tolerance: Patient tolerated treatment well;No increased pain Patient left: in chair;with call bell/phone within reach;with chair alarm set Nurse Communication: Mobility status PT Visit Diagnosis: Other abnormalities of gait and mobility (R26.89);Pain Pain - part of body: (back)     Time: 1701-1720 PT Time Calculation (min) (ACUTE ONLY): 19 min  Charges:  $Gait Training: 8-22 mins                     Governor Rooks, PTA Acute Rehabilitation Services Pager [phone] Office [phone]     Jacolby Risby Eli Hose 06/09/2018, 5:30 PM

## 2018-06-10 MED FILL — Heparin Sodium (Porcine) Inj 1000 Unit/ML: INTRAMUSCULAR | Qty: 30 | Status: AC

## 2018-06-10 MED FILL — Sodium Chloride IV Soln 0.9%: INTRAVENOUS | Qty: 1000 | Status: AC

## 2018-06-10 NOTE — Progress Notes (Signed)
No new issues or problems.  Patient overall stable.  Mobility still marginal and patient unable to care for self at home.  Afebrile.  Vital signs are stable.  Motor and sensory exam intact.  Wound clean and dry.  Chest and abdomen benign.  Patient remains greatly improved following decompression and fusion surgery.  She required skilled nursing facility placement for further convalescence.  Bed placement still pending.

## 2018-06-10 NOTE — Progress Notes (Signed)
Physical Therapy Treatment Patient Details Name: Yvonne [name] MRN: [id number] DOB: [birth year] Today's Date: 06/10/2018    History of Present Illness Pt is a 77 y/o female s/p PLIF L4-L5. PMH including but not limited to HTN, CVA and L THA in 2018.    PT Comments    Pt performed gait training and presented with urinary incontinence.  Multiple transfers performed to toilet and perform perianal care.  Plan for SNF remains appropriate as patient continues to require min guard assistance.  Pt required re-education to recall spinal precautions.    Follow Up Recommendations  SNF     Equipment Recommendations  None recommended by PT    Recommendations for Other Services       Precautions / Restrictions Precautions Precautions: Back;Fall Precaution Booklet Issued: No Precaution Comments: pt unable to recall any back precautions. reviewed all with her Required Braces or Orthoses: Spinal Brace Spinal Brace: Lumbar corset;Applied in sitting position;Other (comment) Restrictions Weight Bearing Restrictions: No    Mobility  Bed Mobility               General bed mobility comments: Pt seated in recliner on arrival.    Transfers Overall transfer level: Needs assistance Equipment used: Rolling walker (2 wheeled) Transfers: Sit to/from Stand Sit to Stand: Min assist         General transfer comment: Cues for hand placement to push into standing vs. pulling on RW for support.    Ambulation/Gait Ambulation/Gait assistance: Min guard;Min assist Gait Distance (Feet): 100 Feet Assistive device: Rolling walker (2 wheeled) Gait Pattern/deviations: Step-through pattern;Decreased stride length;Trunk flexed Gait velocity: increased   General Gait Details: Cues for upper trunk control and Rw position.  pt pushing RW too far forward at times.     Stairs             Wheelchair Mobility    Modified Rankin (Stroke Patients Only)       Balance Overall balance  assessment: Needs assistance Sitting-balance support: Feet supported Sitting balance-Leahy Scale: Fair       Standing balance-Leahy Scale: Poor Standing balance comment: min A or UE supports needed                            Cognition Arousal/Alertness: Awake/alert Behavior During Therapy: Impulsive;Anxious Overall Cognitive Status: Impaired/Different from baseline Area of Impairment: Attention;Memory;Following commands;Safety/judgement;Awareness;Problem solving                   Current Attention Level: Sustained Memory: Decreased recall of precautions;Decreased short-term memory Following Commands: Follows one step commands consistently;Follows multi-step commands inconsistently Safety/Judgement: Decreased awareness of safety;Decreased awareness of deficits Awareness: Intellectual Problem Solving: Difficulty sequencing;Requires verbal cues;Requires tactile cues        Exercises      General Comments        Pertinent Vitals/Pain Pain Assessment: No/denies pain Pain Score: 5  Pain Location: back Pain Descriptors / Indicators: Sore Pain Intervention(s): Monitored during session;Repositioned    Home Living                      Prior Function            PT Goals (current goals can now be found in the care plan section) Acute Rehab PT Goals Patient Stated Goal: go to rehab Potential to Achieve Goals: Good Progress towards PT goals: Progressing toward goals    Frequency    Min 5X/week  PT Plan Current plan remains appropriate    Co-evaluation              AM-PAC PT "6 Clicks" Mobility   Outcome Measure  Help needed turning from your back to your side while in a flat bed without using bedrails?: None Help needed moving from lying on your back to sitting on the side of a flat bed without using bedrails?: A Little Help needed moving to and from a bed to a chair (including a wheelchair)?: A Little Help needed standing  up from a chair using your arms (e.g., wheelchair or bedside chair)?: A Little Help needed to walk in hospital room?: A Little Help needed climbing 3-5 steps with a railing? : A Little 6 Click Score: 19    End of Session Equipment Utilized During Treatment: Gait belt;Back brace Activity Tolerance: Patient tolerated treatment well;No increased pain Patient left: in chair;with call bell/phone within reach;with chair alarm set Nurse Communication: Mobility status PT Visit Diagnosis: Other abnormalities of gait and mobility (R26.89);Pain Pain - part of body: (back)     Time: 6948-5462 PT Time Calculation (min) (ACUTE ONLY): 19 min  Charges:  $Gait Training: 8-22 mins                     Governor Rooks, PTA Acute Rehabilitation Services Pager [phone] Office [phone]     Treyten Monestime Eli Hose 06/10/2018, 1:41 PM

## 2018-06-11 NOTE — Progress Notes (Signed)
Patient ID: [name], female   DOB: Sep 29, 1940, 77 y.o.   MRN: [id number] Vital signs are stable Patient is alert and rehabilitation is commencing She will likely need further period of convalescence as her independence is not forthcoming Continue supportive care

## 2018-06-12 NOTE — Progress Notes (Signed)
Physical Therapy Treatment Patient Details Name: RENISE GILLIES MRN: [id number] DOB: 1941-06-16 Today's Date: 06/12/2018    History of Present Illness Pt is a 77 y/o female s/p PLIF L4-L5. PMH including but not limited to HTN, CVA and L THA in 2018.    PT Comments    Pt required re-education on spinal precautions, brace application and removing brace, and when to wear TLSO.  Pt on 2L [place] on arrival, removed and performed gait on RA patient desaturated to 87% for brief period but able to recover with pursed lip breathing.  Pt would benefit from incentive spirometer to continue to wean.  Pt continues to require SNF placement to improve strength and function before returning home.    Follow Up Recommendations  SNF     Equipment Recommendations  None recommended by PT    Recommendations for Other Services       Precautions / Restrictions Precautions Precautions: Back;Fall Precaution Booklet Issued: No Precaution Comments: Pt able to recall with cueing for acronym to trigger her memory.   Required Braces or Orthoses: Spinal Brace Spinal Brace: Lumbar corset Spinal Brace Comments: re-educated patient on when to wear brace as she was wearing it in bed on arrival.   Restrictions Weight Bearing Restrictions: No    Mobility  Bed Mobility Overal bed mobility: Needs Assistance Bed Mobility: Rolling;Sit to Sidelying;Sidelying to Sit Rolling: Min guard Sidelying to sit: Min assist     Sit to sidelying: Min assist General bed mobility comments: Pt required cues to maintain sternal precautions.    Transfers Overall transfer level: Needs assistance Equipment used: Rolling walker (2 wheeled) Transfers: Sit to/from Stand Sit to Stand: Min guard Stand pivot transfers: Min guard       General transfer comment: Cues for hand placement to push from seated surface vs. pulling on device  Ambulation/Gait Ambulation/Gait assistance: Min guard Gait Distance (Feet): 120 Feet Assistive  device: Rolling walker (2 wheeled) Gait Pattern/deviations: Step-through pattern;Decreased stride length;Trunk flexed Gait velocity: increased   General Gait Details: Cues for upper trunk control and Rw position.  pt pushing RW too far forward at times.  Cues for obstacle negotiation.     Stairs             Wheelchair Mobility    Modified Rankin (Stroke Patients Only)       Balance Overall balance assessment: Needs assistance Sitting-balance support: Feet supported Sitting balance-Leahy Scale: Fair       Standing balance-Leahy Scale: Poor Standing balance comment: min A or UE supports needed                            Cognition Arousal/Alertness: Awake/alert Behavior During Therapy: Impulsive;Anxious Overall Cognitive Status: No family/caregiver present to determine baseline cognitive functioning                                 General Comments: Apparent cognitive deficits; tangential; difficulty attending to task; no recall of back precautions; Difficulty with carry over of back precautions after education      Exercises      General Comments        Pertinent Vitals/Pain Pain Assessment: 0-10 Pain Score: 4  Pain Location: back Pain Descriptors / Indicators: Sore Pain Intervention(s): Monitored during session;Repositioned    Home Living  Prior Function            PT Goals (current goals can now be found in the care plan section) Acute Rehab PT Goals Patient Stated Goal: " to get my hair washed" Potential to Achieve Goals: Good Progress towards PT goals: Progressing toward goals    Frequency    Min 5X/week      PT Plan Current plan remains appropriate    Co-evaluation              AM-PAC PT "6 Clicks" Mobility   Outcome Measure  Help needed turning from your back to your side while in a flat bed without using bedrails?: None Help needed moving from lying on your back to  sitting on the side of a flat bed without using bedrails?: A Little Help needed moving to and from a bed to a chair (including a wheelchair)?: A Little Help needed standing up from a chair using your arms (e.g., wheelchair or bedside chair)?: A Little Help needed to walk in hospital room?: A Little Help needed climbing 3-5 steps with a railing? : A Little 6 Click Score: 19    End of Session Equipment Utilized During Treatment: Gait belt;Back brace Activity Tolerance: Patient tolerated treatment well;No increased pain Patient left: in chair;with call bell/phone within reach;with chair alarm set Nurse Communication: Mobility status PT Visit Diagnosis: Other abnormalities of gait and mobility (R26.89);Pain Pain - part of body: (back)     Time: 5277-8242 PT Time Calculation (min) (ACUTE ONLY): 18 min  Charges:  $Gait Training: 8-22 mins                     Governor Rooks, PTA Acute Rehabilitation Services Pager [phone] Office [phone]     Wah Sabic Eli Hose 06/12/2018, 5:05 PM

## 2018-06-12 NOTE — Clinical Social Work Note (Signed)
Yvonne [name] has started [name].   [name], [name]

## 2018-06-12 NOTE — Progress Notes (Signed)
Patient ID: Yvonne [name], female   DOB: March 19, 1941, 77 y.o.   MRN: [id number] Overall patient doing well. Difficult to communicate this extremely hard of hearing.  Strength appears to be 5 out of 5 incision and placed the clean dry and intact.  Continue to mobilize with physical and occupational therapy Rehabilitation placement patient is cleared 1 bed becomes available.

## 2018-06-12 NOTE — Plan of Care (Signed)
Patient stable, discussed POC with patient, agreeable with plan, denies question/concerns at this time.  

## 2018-06-12 NOTE — Progress Notes (Signed)
Occupational Therapy Treatment Patient Details Name: Yvonne [name] MRN: [id number] DOB: [birth year] Today's Date: 06/12/2018    History of present illness Pt is a 77 y/o female s/p PLIF L4-L5. PMH including but not limited to HTN, CVA and L THA in 2018.   OT comments  Pt completed ADL at sink level with mod A for LB ADL due to difficulty following back precautions. Handout used for visual cues due to pt being so [facility] and impacting her performance. Continue ot recommend rehab at SNF. Will continue to follow acutely.   Follow Up Recommendations  SNF;Supervision/Assistance - 24 hour    Equipment Recommendations  Other (comment)(TBA)    Recommendations for Other Services      Precautions / Restrictions Precautions Precautions: Back;Fall Precaution Comments: pt unable to recall any back precautions. reviewed all with her Required Braces or Orthoses: Spinal Brace Spinal Brace: Lumbar corset       Mobility Bed Mobility               General bed mobility comments: Pt seated in recliner on arrival.    Transfers Overall transfer level: Needs assistance Equipment used: Rolling walker (2 wheeled) Transfers: Sit to/from Stand Sit to Stand: Min guard Stand pivot transfers: Min guard            Balance     Sitting balance-Leahy Scale: Fair       Standing balance-Leahy Scale: Poor                             ADL either performed or assessed with clinical judgement   ADL Overall ADL's : Needs assistance/impaired     Grooming: Oral care;Wash/dry face;Supervision/safety;Set up;Standing       Lower Body Bathing: Moderate assistance;Cueing for sequencing;Cueing for back precautions;Cueing for safety       Lower Body Dressing: Moderate assistance;Cueing for back precautions;Sit to/from stand Lower Body Dressing Details (indicate cue type and reason): Educated on no beding. Pt trying to bend forward to donn underwear Toilet Transfer: Minimal  assistance;Cueing for safety;RW   Toileting- Clothing Manipulation and Hygiene: Moderate assistance       Functional mobility during ADLs: Rolling walker;Cueing for safety;Cueing for sequencing;Minimal assistance General ADL Comments: handout issued for pt to use as visual cues due to her being so [facility]     Vision       Perception     Praxis      Cognition Arousal/Alertness: Awake/alert Behavior During Therapy: Impulsive;Anxious Overall Cognitive Status: No family/caregiver present to determine baseline cognitive functioning                                 General Comments: Apparent cognitive deficits; tangential; difficulty attending to task; no recall of back precautions; Difficulty with carry over of back precautions after education        Exercises     Shoulder Instructions       General Comments      Pertinent Vitals/ Pain       Pain Assessment: Faces Faces Pain Scale: Hurts little more Pain Location: back Pain Descriptors / Indicators: Sore Pain Intervention(s): Limited activity within patient's tolerance  Home Living  Prior Functioning/Environment              Frequency  Min 2X/week        Progress Toward Goals  OT Goals(current goals can now be found in the care plan section)  Progress towards OT goals: Progressing toward goals  Acute Rehab OT Goals Patient Stated Goal: " to get my hair washed" OT Goal Formulation: With patient Time For Goal Achievement: 06/19/18 Potential to Achieve Goals: Good ADL Goals Pt Will Perform Upper Body Bathing: with modified independence;sitting Pt Will Perform Lower Body Bathing: with supervision;sit to/from stand Pt Will Transfer to Toilet: with supervision;grab bars;stand pivot transfer  Plan Discharge plan remains appropriate    Co-evaluation                 AM-PAC OT "6 Clicks" Daily Activity     Outcome Measure   Help  from another person eating meals?: None Help from another person taking care of personal grooming?: A Little Help from another person toileting, which includes using toliet, bedpan, or urinal?: A Little Help from another person bathing (including washing, rinsing, drying)?: A Lot Help from another person to put on and taking off regular upper body clothing?: A Little Help from another person to put on and taking off regular lower body clothing?: A Lot 6 Click Score: 17    End of Session Equipment Utilized During Treatment: Rolling walker;Back brace  OT Visit Diagnosis: Pain;Unsteadiness on feet (R26.81) Pain - part of body: (back)   Activity Tolerance Patient tolerated treatment well   Patient Left in chair;with call bell/phone within reach;with chair alarm set   Nurse Communication Mobility status;Other (comment)(need for back brace)        Time: 8527-7824 OT Time Calculation (min): 28 min  Charges: OT General Charges $OT Visit: 1 Visit OT Treatments $Self Care/Home Management : 23-37 mins  Maurie Boettcher, OT/L   Acute OT Clinical Specialist [place] Pager [phone] Office [phone]    [facility] 06/12/2018, 10:02 AM

## 2018-06-13 NOTE — Progress Notes (Addendum)
This RN called to bedside by NT, patient seen standing at bedside choking. Patient assisted to bed w/back blows administered, food dislodged and suctioned from mouth. O2 @ 2L via [place] applied, tol well. Dr. Sherwood Gambler notified, advised to keep patient NPO overnight, soft diet in am w/witnessed, assisted feeds. Patient remains in bed, VSS, NAD noted at this time.

## 2018-06-13 NOTE — Progress Notes (Signed)
   Providing Compassionate, Quality Care - Together   Subjective: Patient reports no issues overnight. Is up to chair on assessment, wearing TLSO brace.  Objective: Vital signs in last 24 hours: Temp:  [97.8 F (36.6 C)-98.4 F (36.9 C)] 98.4 F (36.9 C) (12/27 1144) Pulse Rate:  [66-87] 87 (12/27 1144) Resp:  [16-20] 20 (12/27 1144) BP: (130-138)/(60-67) 135/60 (12/27 1144) SpO2:  [90 %-98 %] 90 % (12/27 1144)  Intake/Output from previous day: 12/26 0701 - 12/27 0700 In: 243 [P.O.:240; I.V.:3] Out: -  Intake/Output this shift: Total I/O In: 240 [P.O.:240] Out: -   MAE, Strength BUE 5/5, BLE 4/5  Still requiring a great deal of assistance for transfers PERRLA HOH Incision is clean, dry, and intact  Lab Results: No results for input(s): WBC, HGB, HCT, PLT in the last 72 hours. BMET No results for input(s): NA, K, CL, CO2, GLUCOSE, BUN, CREATININE, CALCIUM in the last 72 hours.  Studies/Results: No results found.  Assessment/Plan: Continue to mobilize with therapies. CSW working on placement with [employer].   LOS: 7 days   Viona Gilmore, DNP, AGNP-C Nurse Practitioner  [facility] [place] [address], [place], [place] [address] P: [phone]    F: [phone]  06/13/2018, 12:39 PM

## 2018-06-13 NOTE — Progress Notes (Signed)
Physical Therapy Treatment Patient Details Name: Yvonne [name] MRN: [id number] DOB: 1941-03-14 Today's Date: 06/13/2018    History of Present Illness Pt is a 77 y/o female s/p PLIF L4-L5. PMH including but not limited to HTN, CVA and L THA in 2018.    PT Comments    Pt remains limited secondary to pain and cognitive deficits with poor safety awareness. Pt continues to require constant cueing to maintain back precautions as well. Pt would continue to benefit from skilled physical therapy services at this time while admitted and after d/c to address the below listed limitations in order to improve overall safety and independence with functional mobility.    Follow Up Recommendations  SNF     Equipment Recommendations  None recommended by PT    Recommendations for Other Services       Precautions / Restrictions Precautions Precautions: Back;Fall Precaution Booklet Issued: No Precaution Comments: pt unable to recall any of her back precautions, PT reviewed all precautions with pt throughout session including cueing for log roll technique Required Braces or Orthoses: Spinal Brace Spinal Brace: Lumbar corset;Applied in sitting position Restrictions Weight Bearing Restrictions: No    Mobility  Bed Mobility Overal bed mobility: Needs Assistance Bed Mobility: Rolling;Sidelying to Sit Rolling: Min guard Sidelying to sit: Min assist       General bed mobility comments: cueing required for log roll technique, assist needed for trunk elevation  Transfers Overall transfer level: Needs assistance Equipment used: Rolling walker (2 wheeled) Transfers: Sit to/from Stand Sit to Stand: Min guard         General transfer comment: cueing for safe hand placement, min guard for safety  Ambulation/Gait Ambulation/Gait assistance: Min guard Gait Distance (Feet): 100 Feet Assistive device: Rolling walker (2 wheeled) Gait Pattern/deviations: Step-through pattern;Decreased stride  length;Trunk flexed Gait velocity: decreased   General Gait Details: pt generally steady with RW, maintaining a safe distance with no LOB or need for physical assistance. Cueing for obstacle negotiation as pt kept running into objects with RW   Stairs             Wheelchair Mobility    Modified Rankin (Stroke Patients Only)       Balance Overall balance assessment: Needs assistance Sitting-balance support: Feet supported Sitting balance-Leahy Scale: Fair     Standing balance support: Bilateral upper extremity supported;Single extremity supported Standing balance-Leahy Scale: Poor                              Cognition Arousal/Alertness: Awake/alert Behavior During Therapy: Impulsive;Anxious Overall Cognitive Status: No family/caregiver present to determine baseline cognitive functioning Area of Impairment: Attention;Memory;Following commands;Safety/judgement;Awareness;Problem solving                   Current Attention Level: Sustained Memory: Decreased recall of precautions;Decreased short-term memory Following Commands: Follows one step commands consistently;Follows multi-step commands inconsistently Safety/Judgement: Decreased awareness of safety;Decreased awareness of deficits Awareness: Intellectual Problem Solving: Difficulty sequencing;Requires verbal cues;Requires tactile cues        Exercises      General Comments        Pertinent Vitals/Pain Pain Assessment: Faces Faces Pain Scale: Hurts little more Pain Location: back Pain Descriptors / Indicators: Sore Pain Intervention(s): Monitored during session;Repositioned    Home Living                      Prior Function  PT Goals (current goals can now be found in the care plan section) Acute Rehab PT Goals PT Goal Formulation: With patient Time For Goal Achievement: 06/20/18 Potential to Achieve Goals: Good Progress towards PT goals: Progressing toward  goals    Frequency    Min 5X/week      PT Plan Current plan remains appropriate    Co-evaluation              AM-PAC PT "6 Clicks" Mobility   Outcome Measure  Help needed turning from your back to your side while in a flat bed without using bedrails?: None Help needed moving from lying on your back to sitting on the side of a flat bed without using bedrails?: A Little Help needed moving to and from a bed to a chair (including a wheelchair)?: A Little Help needed standing up from a chair using your arms (e.g., wheelchair or bedside chair)?: None Help needed to walk in hospital room?: A Little Help needed climbing 3-5 steps with a railing? : A Lot 6 Click Score: 19    End of Session Equipment Utilized During Treatment: Gait belt;Back brace Activity Tolerance: Patient tolerated treatment well Patient left: in chair;with call bell/phone within reach;with chair alarm set Nurse Communication: Mobility status PT Visit Diagnosis: Other abnormalities of gait and mobility (R26.89);Pain Pain - part of body: (back)     Time: 8676-7209 PT Time Calculation (min) (ACUTE ONLY): 18 min  Charges:  $Gait Training: 8-22 mins                     Sherie Don, [place], DPT  Acute Rehabilitation Services Pager [phone] Office [place] 06/13/2018, 2:35 PM

## 2018-06-13 NOTE — Progress Notes (Signed)
CSW following for discharge plan. Patient continues to await [facility] authorization to admit to SNF.  CSW to follow.  Laveda Abbe, [place] Clinical Social Worker [phone]

## 2018-06-14 MED FILL — Medication: Qty: 1 | Status: AC

## 2018-06-14 NOTE — Progress Notes (Signed)
  NEUROSURGERY PROGRESS NOTE   Choking episode yesterday evening noted. Patient believes she just took too big of a piece of food Complains of mild back soreness Incision c/d/i  EXAM:  BP (!) 115/52 (BP Location: Right Arm)   Pulse 73   Temp 98.2 F (36.8 C) (Oral)   Resp 16   Ht 5\' 1"  (1.549 m)   Wt 67 kg   SpO2 91%   BMI 27.91 kg/m   Awake, alert, oriented  Speech fluent, appropriate  CN grossly intact  5/5 BUE, 4/5 BLE  PLAN Stable this am Choking episode: advance diet as tolerated dispo planning

## 2018-06-15 NOTE — Progress Notes (Signed)
Patient ID: Yvonne [name], female   DOB: October 03, 1940, 77 y.o.   MRN: [id number] Patient seen and examined, wound clean dry and intact, moves legs well, awaiting placement.

## 2018-06-15 NOTE — Plan of Care (Signed)
Patient stable, discussed POC with patient, agreeable with plan, denies question/concerns at this time. Awaiting placement at this time.

## 2018-06-16 DIAGNOSIS — K5901 Slow transit constipation: Secondary | ICD-10-CM | POA: Diagnosis not present

## 2018-06-16 DIAGNOSIS — R262 Difficulty in walking, not elsewhere classified: Secondary | ICD-10-CM | POA: Diagnosis not present

## 2018-06-16 DIAGNOSIS — R2689 Other abnormalities of gait and mobility: Secondary | ICD-10-CM | POA: Diagnosis not present

## 2018-06-16 DIAGNOSIS — Z8673 Personal history of transient ischemic attack (TIA), and cerebral infarction without residual deficits: Secondary | ICD-10-CM | POA: Diagnosis not present

## 2018-06-16 DIAGNOSIS — Z4789 Encounter for other orthopedic aftercare: Secondary | ICD-10-CM | POA: Diagnosis not present

## 2018-06-16 DIAGNOSIS — I350 Nonrheumatic aortic (valve) stenosis: Secondary | ICD-10-CM | POA: Diagnosis not present

## 2018-06-16 DIAGNOSIS — Z7409 Other reduced mobility: Secondary | ICD-10-CM | POA: Diagnosis not present

## 2018-06-16 DIAGNOSIS — M2550 Pain in unspecified joint: Secondary | ICD-10-CM | POA: Diagnosis not present

## 2018-06-16 DIAGNOSIS — R05 Cough: Secondary | ICD-10-CM | POA: Diagnosis not present

## 2018-06-16 DIAGNOSIS — R41841 Cognitive communication deficit: Secondary | ICD-10-CM | POA: Diagnosis not present

## 2018-06-16 DIAGNOSIS — I10 Essential (primary) hypertension: Secondary | ICD-10-CM | POA: Diagnosis not present

## 2018-06-16 DIAGNOSIS — M1990 Unspecified osteoarthritis, unspecified site: Secondary | ICD-10-CM | POA: Diagnosis not present

## 2018-06-16 DIAGNOSIS — Z7401 Bed confinement status: Secondary | ICD-10-CM | POA: Diagnosis not present

## 2018-06-16 DIAGNOSIS — M4310 Spondylolisthesis, site unspecified: Secondary | ICD-10-CM | POA: Diagnosis not present

## 2018-06-16 DIAGNOSIS — M6281 Muscle weakness (generalized): Secondary | ICD-10-CM | POA: Diagnosis not present

## 2018-06-16 DIAGNOSIS — F339 Major depressive disorder, recurrent, unspecified: Secondary | ICD-10-CM | POA: Diagnosis not present

## 2018-06-16 DIAGNOSIS — K5904 Chronic idiopathic constipation: Secondary | ICD-10-CM | POA: Diagnosis not present

## 2018-06-16 DIAGNOSIS — E038 Other specified hypothyroidism: Secondary | ICD-10-CM | POA: Diagnosis not present

## 2018-06-16 DIAGNOSIS — G629 Polyneuropathy, unspecified: Secondary | ICD-10-CM | POA: Diagnosis not present

## 2018-06-16 DIAGNOSIS — Z981 Arthrodesis status: Secondary | ICD-10-CM | POA: Diagnosis not present

## 2018-06-16 MED ORDER — KETOROLAC TROMETHAMINE 30 MG/ML IJ SOLN: 30.0000 mg | Freq: Four times a day (QID) | INTRAMUSCULAR | Status: DC | PRN

## 2018-06-16 MED ORDER — POLYETHYLENE GLYCOL 3350 17 G PO PACK: 17.0000 g | PACK | Freq: Every day | ORAL | 0 refills | Status: DC | PRN

## 2018-06-16 MED ORDER — ACETAMINOPHEN 325 MG PO TABS: 650.0000 mg | ORAL_TABLET | ORAL | 2 refills | Status: DC | PRN

## 2018-06-16 MED ORDER — KETOROLAC TROMETHAMINE 30 MG/ML IJ SOLN
INTRAMUSCULAR | Status: AC
  Administered 2018-06-16: 30 mg
  Filled 2018-06-16: qty 1

## 2018-06-16 NOTE — Progress Notes (Signed)
Pt being discharged from hospital to skilled nursing facility per orders from MD. Pt aware of discharge. Pt verbalized understanding of plans. Pt's IV was removed prior to discharge. Pt exited hospital via stretcher accompanied by transport. RN called and gave report to [place] at [place].

## 2018-06-16 NOTE — Discharge Summary (Signed)
Physician Discharge Summary  Patient ID: Yvonne [name] MRN: [id number] DOB/AGE: [birth year] 77 y.o.  Admit date: 06/06/2018 Discharge date: 06/16/2018  Admission Diagnoses: Degenerative spondylolisthesis  Discharge Diagnoses:  Active Problems:   Degenerative spondylolisthesis   Discharged Condition: good  Hospital Course: Yvonne [name] underwent L4-5 decompression and fusion surgery on 06/06/2018 for grade 2 degenerative spondylolisthesis. She did well post-operatively, but due to limited support at home, it was decided she would benefit from a skilled nursing facility for further rehabilitation. She is being discharged to [facility].  Consults: None  Significant Diagnostic Studies: radiology: Intraoperative spot images demonstrate localization instruments posterior to the L5 pedicles followed by posterior fusion changes and pedicle placement at L4 and L5. No hardware bony complicating feature.  Treatments: surgery: posterior lumbar interbody fusion at L4-5 (Medtronic cages, Stryker screws)  Discharge Exam: Blood pressure (!) 135/56, pulse 67, temperature 98.6 F (37 C), temperature source Oral, resp. rate 16, height 5\' 1"  (1.549 m), weight 67 kg, SpO2 94 %.   MAE, Strength BUE 5/5, BLE 4/5  Still requiring a great deal of assistance for transfers PERRLA HOH Incision is clean, dry, intact, and well-approximated  Disposition: Discharge disposition: 03-Skilled Nursing Facility        Allergies as of 06/16/2018   No Known Allergies     Medication List    TAKE these medications   acetaminophen 325 MG tablet Commonly known as:  TYLENOL Take 2 tablets (650 mg total) by mouth every 4 (four) hours as needed for mild pain ((score 1 to 3) or temp > 100.5).   amitriptyline 50 MG tablet Commonly known as:  ELAVIL TAKE 3 TABLETS AT BEDTIME   amLODipine 5 MG tablet Commonly known as:  NORVASC Take 1 tablet (5 mg total) by mouth daily.   aspirin 81 MG chewable tablet Chew  81 mg by mouth 2 (two) times daily.   atorvastatin 10 MG tablet Commonly known as:  LIPITOR Take 1 tablet (10 mg total) by mouth daily.   benazepril 40 MG tablet Commonly known as:  LOTENSIN Take 1 tablet (40 mg total) by mouth daily.   gabapentin 100 MG capsule Commonly known as:  NEURONTIN Take 1 capsule (100 mg total) by mouth 3 (three) times daily.   hydrochlorothiazide 25 MG tablet Commonly known as:  HYDRODIURIL Take 1 tablet (25 mg total) by mouth daily.   levothyroxine 75 MCG tablet Commonly known as:  SYNTHROID, LEVOTHROID Take 1 tablet (75 mcg total) by mouth daily.   lidocaine-prilocaine cream Commonly known as:  EMLA Apply up to 2 grams three times a day as needed for pain.   multivitamin with minerals Tabs tablet Take 1 tablet by mouth daily with breakfast.   omega-3 acid ethyl esters 1 g capsule Commonly known as:  LOVAZA TAKE 1 CAPSULE TWICE DAILY   OSTEO BI-FLEX REGULAR STRENGTH PO Take 2 tablets by mouth daily.   polyethylene glycol packet Commonly known as:  MIRALAX / GLYCOLAX Take 17 g by mouth daily as needed for mild constipation.            Durable Medical Equipment  (From admission, onward)         Start     Ordered   06/06/18 1504  DME Walker rolling  Once    Question:  Patient needs a walker to treat with the following condition  Answer:  Degenerative spondylolisthesis   06/06/18 1503   06/06/18 1504  DME 3 n 1  Once     06/06/18  1503         Contact information for after-discharge care    [facility] .   Service:  Skilled Nursing Contact information: [address] [place] [phone]              Signed: Patricia Nettle 06/16/2018, 3:12 PM

## 2018-06-16 NOTE — Progress Notes (Signed)
   Providing Compassionate, Quality Care - Together   Subjective: Patient reports no issues overnight. Getting ready to work with PT upon assessment.  Objective: Vital signs in last 24 hours: Temp:  [97.7 F (36.5 C)-98.6 F (37 C)] 98.6 F (37 C) (12/30 1113) Pulse Rate:  [66-77] 67 (12/30 1113) Resp:  [16-18] 16 (12/30 1113) BP: (113-162)/(56-74) 135/56 (12/30 1113) SpO2:  [90 %-94 %] 94 % (12/30 1113)  Intake/Output from previous day: No intake/output data recorded. Intake/Output this shift: No intake/output data recorded.  MAE, Strength BUE 5/5, BLE 4/5  Still requiring a great deal of assistance for transfers PERRLA HOH Incision is clean, dry, intact, and well-approximated  Lab Results: No results for input(s): WBC, HGB, HCT, PLT in the last 72 hours. BMET No results for input(s): NA, K, CL, CO2, GLUCOSE, BUN, CREATININE, CALCIUM in the last 72 hours.  Studies/Results: No results found.  Assessment/Plan: Pt is doing well 10 days following bilateral L4-5 decompressive laminotomies and foraminotomies with posterior lumbar interbody fusion.   LOS: 10 days   Continue to mobilize with therapies. CSW is still working on placement with [employer].  Viona Gilmore, DNP, AGNP-C Nurse Practitioner  [facility] [address], [place], [place] [address] P: [phone]    F: [phone]  06/16/2018, 1:03 PM

## 2018-06-16 NOTE — Progress Notes (Signed)
CSW following for discharge. CSW reached out to Admissions at [facility]; authorization is still pending. [employer] will likely request updated clinicals today since authorization was initiated on 12/26, and PT is scheduled to work with patient today. CSW to send updates to [facility] when obtained.  CSW to continue to follow.  Laveda Abbe, [place] Clinical Social Worker [phone]

## 2018-06-16 NOTE — Progress Notes (Signed)
Physical Therapy Treatment Patient Details Name: Yvonne [name] MRN: [id number] DOB: Mar 15, 1941 Today's Date: 06/16/2018    History of Present Illness Pt is a 77 y/o female s/p PLIF L4-L5. PMH including but not limited to HTN, CVA and L THA in 2018.    PT Comments    Patient seen for mobility progression. Requires continued education on back precautions as well as log rolling technique and brace wear schedule. Patient with Min A for bed level mobility to maintain back precautions. Ambulating in hallway with RW with consistent verbal cueing for safety and obstacle navigation as patient often hits objects with RW that is in her path. Will continue to recommend SNF to progress safe and independent functional mobility prior to return home.     Follow Up Recommendations  SNF     Equipment Recommendations  None recommended by PT    Recommendations for Other Services       Precautions / Restrictions Precautions Precautions: Back;Fall Precaution Booklet Issued: No Precaution Comments: patient able to state "BLT" but only able to state no twisting - PT provided cueing on no bending, lifting Required Braces or Orthoses: Spinal Brace Spinal Brace: Lumbar corset;Applied in sitting position Spinal Brace Comments: re-educated patient on brace wear schedule Restrictions Weight Bearing Restrictions: No    Mobility  Bed Mobility Overal bed mobility: Needs Assistance Bed Mobility: Rolling;Sidelying to Sit;Sit to Sidelying Rolling: Min guard Sidelying to sit: Min assist     Sit to sidelying: Min assist General bed mobility comments: able to roll with increased time/effort; once propped on elbow in sidelying requires Min A to achieve full sitting posture. Min A to return to sidelying position from sitting EOB  Transfers Overall transfer level: Needs assistance Equipment used: Rolling walker (2 wheeled) Transfers: Sit to/from Stand Sit to Stand: Min guard         General transfer  comment: cueing for safety and sequencing. Slight posterior lean with initial standing balance  Ambulation/Gait Ambulation/Gait assistance: Min guard Gait Distance (Feet): 120 Feet Assistive device: Rolling walker (2 wheeled) Gait Pattern/deviations: Step-through pattern;Decreased stride length;Drifts right/left Gait velocity: decreased   General Gait Details: patient with poor safety awareness and poor surrounding awareness with patient hitting multiple objects in her path. Cueing for safety throughout   Stairs             Wheelchair Mobility    Modified Rankin (Stroke Patients Only)       Balance Overall balance assessment: Needs assistance Sitting-balance support: Feet supported Sitting balance-Leahy Scale: Fair     Standing balance support: During functional activity;Bilateral upper extremity supported Standing balance-Leahy Scale: Poor Standing balance comment: reliant on external support                            Cognition Arousal/Alertness: Awake/alert Behavior During Therapy: WFL for tasks assessed/performed;Impulsive Overall Cognitive Status: No family/caregiver present to determine baseline cognitive functioning                                        Exercises      General Comments        Pertinent Vitals/Pain Pain Assessment: No/denies pain    Home Living                      Prior Function  PT Goals (current goals can now be found in the care plan section) Acute Rehab PT Goals Patient Stated Goal: " to get my hair washed" PT Goal Formulation: With patient Time For Goal Achievement: 06/20/18 Potential to Achieve Goals: Good Progress towards PT goals: Progressing toward goals    Frequency    Min 5X/week      PT Plan Current plan remains appropriate    Co-evaluation              AM-PAC PT "6 Clicks" Mobility   Outcome Measure  Help needed turning from your back to your  side while in a flat bed without using bedrails?: None Help needed moving from lying on your back to sitting on the side of a flat bed without using bedrails?: A Little Help needed moving to and from a bed to a chair (including a wheelchair)?: A Little Help needed standing up from a chair using your arms (e.g., wheelchair or bedside chair)?: A Little Help needed to walk in hospital room?: A Little Help needed climbing 3-5 steps with a railing? : A Lot 6 Click Score: 18    End of Session Equipment Utilized During Treatment: Gait belt;Back brace Activity Tolerance: Patient tolerated treatment well Patient left: in bed;with call bell/phone within reach;with bed alarm set Nurse Communication: Mobility status PT Visit Diagnosis: Other abnormalities of gait and mobility (R26.89);Pain     Time: 1202-1219 PT Time Calculation (min) (ACUTE ONLY): 17 min  Charges:  $Gait Training: 8-22 mins                     Lanney Gins, PT, DPT Supplemental Physical Therapist 06/16/18 1:02 PM Pager: [phone] Office: [phone]

## 2018-06-16 NOTE — Clinical Social Work Placement (Signed)
Nurse to call report to [phone], Room [facility]  NOTE  Date:  06/16/2018  Patient Details  Name: Yvonne [name] MRN: [id number] Date of Birth: [birth year]  Clinical Social Work is seeking post-discharge placement for this patient at the [place] level of care (*CSW will initial, date and re-position this form in  chart as items are completed):  Yes   Patient/family provided with [facility] Work Department's list of facilities offering this level of care within the geographic area requested by the patient (or if unable, by the patient's family).  Yes   Patient/family informed of their freedom to choose among providers that offer the needed level of care, that participate in Medicare, Medicaid or managed care program needed by the patient, have an available bed and are willing to accept the patient.  Yes   Patient/family informed of [facility]'s ownership interest in [facility] and [facility], as well as of the fact that they are under no obligation to receive care at these facilities.  PASRR submitted to EDS on       PASRR number received on       Existing PASRR number confirmed on       FL2 transmitted to all facilities in geographic area requested by pt/family on       FL2 transmitted to all facilities within larger geographic area on       Patient informed that his/her managed care company has contracts with or will negotiate with certain facilities, including the following:        Yes   Patient/family informed of bed offers received.  Patient chooses bed at [facility] recommends and patient chooses bed at      Patient to be transferred to [facility] on 06/16/18.  Patient to be transferred to facility by [facility]     Patient family notified on 06/16/18 of transfer.  Name of family member notified:  Self     PHYSICIAN       Additional Comment:     _______________________________________________ Geralynn Ochs, LCSW 06/16/2018, 3:44 PM

## 2018-06-17 ENCOUNTER — Encounter: Payer: Self-pay | Admitting: Internal Medicine

## 2018-06-17 ENCOUNTER — Non-Acute Institutional Stay (SKILLED_NURSING_FACILITY): Payer: Medicare HMO | Admitting: Internal Medicine

## 2018-06-17 DIAGNOSIS — R05 Cough: Secondary | ICD-10-CM | POA: Diagnosis not present

## 2018-06-17 DIAGNOSIS — M4310 Spondylolisthesis, site unspecified: Secondary | ICD-10-CM | POA: Diagnosis not present

## 2018-06-17 DIAGNOSIS — K5904 Chronic idiopathic constipation: Secondary | ICD-10-CM | POA: Diagnosis not present

## 2018-06-17 DIAGNOSIS — Z7409 Other reduced mobility: Secondary | ICD-10-CM

## 2018-06-17 DIAGNOSIS — I10 Essential (primary) hypertension: Secondary | ICD-10-CM | POA: Diagnosis not present

## 2018-06-17 DIAGNOSIS — R059 Cough, unspecified: Secondary | ICD-10-CM | POA: Insufficient documentation

## 2018-06-17 NOTE — Assessment & Plan Note (Signed)
BP controlled; no change in antihypertensive medications at this time.  Her PCP may wish to consider changing the ACE inhibitor to an ARB because of the chronic nonproductive cough which occurs daily.

## 2018-06-17 NOTE — Progress Notes (Signed)
NURSING HOME LOCATION:  [facility] ROOM NUMBER:  [id number]  CODE STATUS:  Full Code  PCP:  Donnamae Jude, MD  [place] [address]   This is a comprehensive admission note to [facility] performed on this date less than 30 days from date of admission.  Included are preadmission medical/surgical history; reconciled medication list; family history; social history and comprehensive review of systems.   Corrections and additions to the records were documented. Comprehensive physical exam was also performed. Additionally a clinical summary was entered for each active diagnosis pertinent to this admission in the Problem List to enhance continuity of care.  HPI: The patient was hospitalized 12/20-12/30/2019, admitted for L4-5 decompression and fusion on 12/20 for grade 2 degenerative spondylolisthesis.  It is stated that she did well postoperatively w/o complications but has limited support at home prompting discharge to this SNF. Discharge med list includes acetaminophen 650 mg every 4 hours as needed which potentially could approach the hepatotoxicity risk level.  Hospital labs were reviewed.  Chemistries and electrolytes were normal.  CBC and differential were normal except for mildly elevated white count of 11,000.  The most recent A1c on record was 03/27/2018 with a value of 6.2%.  Past medical and surgical history: History of stroke, history of mild cognitive impairment with memory loss, hypothyroidism, essential hypertension, aortic stenosis, and anxiety disorder.  Other surgeries include total hip arthroplasty, abdominal hysterectomy, as well as "brain surgery" in her 67s.  Social history: History of occasional alcoholic beverage intake, never smoked  Family history: Reviewed, noncontributory due to age   Review of systems: She states that prior to the surgery she could only walk 5 steps without stopping because of "imbalance".  She denies limitation of  ambulation due to pain and denied any radicular symptoms.  She made the comment "it was if I could not move another step without holding onto something".  Here at the SNF she has been ambulating with a rolling walker or 4 pronged cane.  She does state that there is been significant improvement in her pain since the surgery. She describes constipation; she denies any stool or urinary incontinence.She has chronic numbness in her feet. Does have some upper respiratory tract extrinsic symptoms.  She also has intermittent nonproductive cough throughout the day.  She is on an ACE inhibitor.  Depression has been a chronic problem for which she is on amitriptyline. She does validate having had "brain surgery" in her 63s but cannot give any specifics.  Constitutional: No fever, significant weight change  Eyes: No redness, discharge, pain, vision change ENT/mouth: No nasal congestion, purulent discharge, earache, change in hearing, sore throat  Cardiovascular: No chest pain, palpitations, paroxysmal nocturnal dyspnea, claudication, edema  Respiratory: No sputum production, hemoptysis, DOE, significant snoring, apnea  Gastrointestinal: No heartburn, dysphagia, abdominal pain, nausea /vomiting, rectal bleeding, melena Genitourinary: No dysuria, hematuria, pyuria, incontinence, nocturia Musculoskeletal: No joint stiffness, joint swelling, weakness Dermatologic: No rash, pruritus, change in appearance of skin Neurologic: No dizziness, headache, syncope, seizures Psychiatric: No significant anxiety, insomnia, anorexia Endocrine: No change in hair/skin/nails, excessive thirst, excessive hunger, excessive urination  Hematologic/lymphatic: No significant bruising, lymphadenopathy, abnormal bleeding Allergy/immunology: No urticaria, angioedema  Physical exam:  Pertinent or positive findings: She is profoundly hard of hearing.  She exhibits intermittent exotropia of the right eye.  The iris of the right eye is  slightly darker than the left.  There is slight asymmetry of the nasolabial folds.  She has  an upper plate.  There is a very loud grade 9-3.8 systolic murmur with bilateral neck radiation.  Her legs are weaker than the upper extremities to opposition.  She has mild osteoarthritic changes of the hands.  General appearance: Adequately nourished; no acute distress, increased work of breathing is present.   Lymphatic: No lymphadenopathy about the head, neck, axilla. Eyes: No conjunctival inflammation or lid edema is present. There is no scleral icterus. Ears:  External ear exam shows no significant lesions or deformities.   Nose:  External nasal examination shows no deformity or inflammation. Nasal mucosa are pink and moist without lesions, exudates Oral exam: Lips and gums are healthy appearing.There is no oropharyngeal erythema or exudate. Neck:  No thyromegaly, masses, tenderness noted.    Heart:  Normal rate and regular rhythm. S1 and S2 normal without gallop, click, rub.  Lungs: Chest clear to auscultation without wheezes, rhonchi, rales, rubs. Abdomen: Bowel sounds are normal.  Abdomen is soft and nontender with no organomegaly, hernias, masses. GU: Deferred  Extremities:  No cyanosis, clubbing, edema. Neurologic exam: Balance, Rhomberg, finger to nose testing could not be completed due to clinical state Deep tendon reflexes are equal Skin: Warm & dry w/o tenting. No significant lesions or rash.  See clinical summary under each active problem in the Problem List with associated updated therapeutic plan.

## 2018-06-17 NOTE — Assessment & Plan Note (Addendum)
06/17/18 she states that the symptoms are significantly better since she has had the neurosurgery PT/OT at the SNF

## 2018-06-17 NOTE — Assessment & Plan Note (Addendum)
PT/OT at the SNF Neurosurgery follow-up in late January 2020

## 2018-06-17 NOTE — Patient Instructions (Signed)
See assessment and plan under each diagnosis in the problem list and acutely for this visit 

## 2018-06-18 DIAGNOSIS — M4310 Spondylolisthesis, site unspecified: Secondary | ICD-10-CM | POA: Diagnosis not present

## 2018-06-18 DIAGNOSIS — M1990 Unspecified osteoarthritis, unspecified site: Secondary | ICD-10-CM | POA: Diagnosis not present

## 2018-06-18 DIAGNOSIS — R262 Difficulty in walking, not elsewhere classified: Secondary | ICD-10-CM | POA: Diagnosis not present

## 2018-06-18 DIAGNOSIS — I10 Essential (primary) hypertension: Secondary | ICD-10-CM | POA: Diagnosis not present

## 2018-06-18 DIAGNOSIS — Z8673 Personal history of transient ischemic attack (TIA), and cerebral infarction without residual deficits: Secondary | ICD-10-CM | POA: Diagnosis not present

## 2018-06-18 DIAGNOSIS — Z981 Arthrodesis status: Secondary | ICD-10-CM | POA: Diagnosis not present

## 2018-06-18 DIAGNOSIS — M6281 Muscle weakness (generalized): Secondary | ICD-10-CM | POA: Diagnosis not present

## 2018-06-18 DIAGNOSIS — E038 Other specified hypothyroidism: Secondary | ICD-10-CM | POA: Diagnosis not present

## 2018-06-18 DIAGNOSIS — R41841 Cognitive communication deficit: Secondary | ICD-10-CM | POA: Diagnosis not present

## 2018-06-18 DIAGNOSIS — K5901 Slow transit constipation: Secondary | ICD-10-CM | POA: Diagnosis not present

## 2018-06-18 DIAGNOSIS — R2689 Other abnormalities of gait and mobility: Secondary | ICD-10-CM | POA: Diagnosis not present

## 2018-06-18 DIAGNOSIS — Z4789 Encounter for other orthopedic aftercare: Secondary | ICD-10-CM | POA: Diagnosis not present

## 2018-06-18 DIAGNOSIS — I350 Nonrheumatic aortic (valve) stenosis: Secondary | ICD-10-CM | POA: Diagnosis not present

## 2018-06-18 DIAGNOSIS — F339 Major depressive disorder, recurrent, unspecified: Secondary | ICD-10-CM | POA: Diagnosis not present

## 2018-06-18 DIAGNOSIS — G629 Polyneuropathy, unspecified: Secondary | ICD-10-CM | POA: Diagnosis not present

## 2018-06-18 NOTE — Assessment & Plan Note (Signed)
Add Senna S qhs

## 2018-06-27 ENCOUNTER — Non-Acute Institutional Stay (SKILLED_NURSING_FACILITY): Payer: Medicare HMO | Admitting: Adult Health

## 2018-06-27 ENCOUNTER — Encounter: Payer: Self-pay | Admitting: Adult Health

## 2018-06-27 DIAGNOSIS — G629 Polyneuropathy, unspecified: Secondary | ICD-10-CM | POA: Diagnosis not present

## 2018-06-27 DIAGNOSIS — M4310 Spondylolisthesis, site unspecified: Secondary | ICD-10-CM | POA: Diagnosis not present

## 2018-06-27 DIAGNOSIS — F339 Major depressive disorder, recurrent, unspecified: Secondary | ICD-10-CM

## 2018-06-27 DIAGNOSIS — E038 Other specified hypothyroidism: Secondary | ICD-10-CM

## 2018-06-27 DIAGNOSIS — I10 Essential (primary) hypertension: Secondary | ICD-10-CM | POA: Diagnosis not present

## 2018-06-27 DIAGNOSIS — Z8673 Personal history of transient ischemic attack (TIA), and cerebral infarction without residual deficits: Secondary | ICD-10-CM

## 2018-06-27 DIAGNOSIS — K5901 Slow transit constipation: Secondary | ICD-10-CM | POA: Diagnosis not present

## 2018-06-27 MED ORDER — LIDOCAINE-PRILOCAINE 2.5-2.5 % EX CREA: TOPICAL_CREAM | CUTANEOUS | 0 refills | Status: DC

## 2018-06-27 MED ORDER — BENAZEPRIL HCL 40 MG PO TABS: 40.0000 mg | ORAL_TABLET | Freq: Every day | ORAL | 0 refills | Status: DC

## 2018-06-27 MED ORDER — SENNOSIDES-DOCUSATE SODIUM 8.6-50 MG PO TABS: 2.0000 | ORAL_TABLET | Freq: Two times a day (BID) | ORAL | 0 refills | Status: DC

## 2018-06-27 MED ORDER — GABAPENTIN 100 MG PO CAPS: 100.0000 mg | ORAL_CAPSULE | Freq: Three times a day (TID) | ORAL | 0 refills | Status: DC

## 2018-06-27 MED ORDER — LEVOTHYROXINE SODIUM 75 MCG PO TABS: 75.0000 ug | ORAL_TABLET | Freq: Every day | ORAL | 0 refills | Status: DC

## 2018-06-27 MED ORDER — ATORVASTATIN CALCIUM 10 MG PO TABS: 10.0000 mg | ORAL_TABLET | Freq: Every day | ORAL | 0 refills | Status: DC

## 2018-06-27 MED ORDER — AMITRIPTYLINE HCL 50 MG PO TABS: 150.0000 mg | ORAL_TABLET | Freq: Every day | ORAL | 0 refills | Status: DC

## 2018-06-27 MED ORDER — AMLODIPINE BESYLATE 5 MG PO TABS: 5.0000 mg | ORAL_TABLET | Freq: Every day | ORAL | 0 refills | Status: DC

## 2018-06-27 MED ORDER — HYDROCHLOROTHIAZIDE 25 MG PO TABS: 25.0000 mg | ORAL_TABLET | Freq: Every day | ORAL | 0 refills | Status: DC

## 2018-06-27 MED ORDER — OMEGA-3-ACID ETHYL ESTERS 1 G PO CAPS: 1.0000 | ORAL_CAPSULE | Freq: Two times a day (BID) | ORAL | 0 refills | Status: DC

## 2018-06-27 NOTE — Progress Notes (Signed)
Location:  [facility] Room Number: 956 Place of Service:  SNF (31) Provider:  Durenda Age, NP  Patient Care Team: Donnamae Jude, MD as PCP - General Nahser, Wonda Cheng, MD as PCP - Cardiology (Cardiology)  Extended Emergency Contact Information Primary Emergency Contact: [place] of [place] Phone: [phone] Relation: Son  Code Status:  DNR  Goals of care: Advanced Directive information Advanced Directives 06/08/2018  Does Patient Have a Medical Advance Directive? Yes  Type of Advance Directive [place]  Does patient want to make changes to medical advance directive? No - Patient declined  Copy of [place] in Chart? -  Would patient like information on creating a medical advance directive? -     Chief Complaint  Patient presents with  . Discharge Note    Patient wants to discharge home on 06/28/18.    HPI:  Pt is a 78 y.o. female seen today for discharge.  She is to discharge home on 06/28/18 with home health OT and PT.  She has been admitted to [facility] on 06/07/19 from a recent hospitalization S/P L4-5 decompression and fusion surgery on 06/06/2018 for grade 2 degenerative spondylolisthesis. She has a PMH of stroke, mild cognitive impairment with memory loss, hypothyroidism, essential hypertension, aortic stenosis, and anxiety disorder.    Patient was admitted to this facility for short-term rehabilitation after the patient's recent hospitalization.  Patient has completed SNF rehabilitation and therapy has cleared the patient for discharge.   Past Medical History:  Diagnosis Date  . Allergy   . Anemia   . Anxiety   . Aortic stenosis    mild by 07/2011 echo   . Arthritis   . Heart murmur   . Hypertension   . Hypothyroidism   . Impaired functional mobility, balance, gait, and endurance 09/02/2015  . Medical history non-contributory   . Mild  cognitive impairment with memory loss 09/02/2015  . Pneumonia   . Stroke [facility])    pt was 9   Past Surgical History:  Procedure Laterality Date  . ABDOMINAL HYSTERECTOMY    . BRAIN SURGERY     age 71's  . JOINT REPLACEMENT    . NO PAST SURGERIES    . TOE AMPUTATION  2013   2nd toe on right foot, hammer toe  . TONSILLECTOMY    . TOTAL HIP ARTHROPLASTY Left 03/19/2017   Procedure: LEFT TOTAL HIP ARTHROPLASTY ANTERIOR APPROACH;  Surgeon: Mcarthur Rossetti, MD;  Location: [facility];  Service: Orthopedics;  Laterality: Left;    No Known Allergies  Outpatient Encounter Medications as of 06/27/2018  Medication Sig  . acetaminophen (TYLENOL) 500 MG tablet Take 500 mg by mouth every 4 (four) hours as needed for mild pain.  [place] Kitchen amitriptyline (ELAVIL) 50 MG tablet Take 3 tablets (150 mg total) by mouth at bedtime.  Marland Kitchen amLODipine (NORVASC) 5 MG tablet Take 1 tablet (5 mg total) by mouth daily for 30 days.  Marland Kitchen aspirin 81 MG chewable tablet Chew 81 mg by mouth 2 (two) times daily.  Marland Kitchen atorvastatin (LIPITOR) 10 MG tablet Take 1 tablet (10 mg total) by mouth daily.  . benazepril (LOTENSIN) 40 MG tablet Take 1 tablet (40 mg total) by mouth daily.  Marland Kitchen gabapentin (NEURONTIN) 100 MG capsule Take 1 capsule (100 mg total) by mouth 3 (three) times daily.  . Glucosamine-Chondroitin (OSTEO BI-FLEX REGULAR STRENGTH PO) Take 2 tablets by mouth daily.  . hydrochlorothiazide (HYDRODIURIL)  25 MG tablet Take 1 tablet (25 mg total) by mouth daily for 30 days.  Marland Kitchen levothyroxine (SYNTHROID, LEVOTHROID) 75 MCG tablet Take 1 tablet (75 mcg total) by mouth daily.  Marland Kitchen lidocaine-prilocaine (EMLA) cream Apply up to 2 grams three times a day as needed for pain.  . Multiple Vitamin (MULTIVITAMIN WITH MINERALS) TABS tablet Take 1 tablet by mouth daily with breakfast.  . omega-3 acid ethyl esters (LOVAZA) 1 g capsule Take 1 capsule (1 g total) by mouth 2 (two) times daily.  . polyethylene glycol (MIRALAX / GLYCOLAX) packet Take 17 g  by mouth daily as needed for mild constipation.  . [DISCONTINUED] amitriptyline (ELAVIL) 50 MG tablet TAKE 3 TABLETS AT BEDTIME  . [DISCONTINUED] amLODipine (NORVASC) 5 MG tablet Take 1 tablet (5 mg total) by mouth daily.  . [DISCONTINUED] atorvastatin (LIPITOR) 10 MG tablet Take 1 tablet (10 mg total) by mouth daily.  . [DISCONTINUED] benazepril (LOTENSIN) 40 MG tablet Take 1 tablet (40 mg total) by mouth daily.  . [DISCONTINUED] gabapentin (NEURONTIN) 100 MG capsule Take 1 capsule (100 mg total) by mouth 3 (three) times daily.  . [DISCONTINUED] hydrochlorothiazide (HYDRODIURIL) 25 MG tablet Take 1 tablet (25 mg total) by mouth daily.  . [DISCONTINUED] levothyroxine (SYNTHROID, LEVOTHROID) 75 MCG tablet Take 1 tablet (75 mcg total) by mouth daily.  . [DISCONTINUED] lidocaine-prilocaine (EMLA) cream Apply up to 2 grams three times a day as needed for pain.  . [DISCONTINUED] omega-3 acid ethyl esters (LOVAZA) 1 g capsule TAKE 1 CAPSULE TWICE DAILY  . senna-docusate (SENOKOT-S) 8.6-50 MG tablet Take 2 tablets by mouth 2 (two) times daily.  . [DISCONTINUED] acetaminophen (TYLENOL) 325 MG tablet Take 2 tablets (650 mg total) by mouth every 4 (four) hours as needed for mild pain ((score 1 to 3) or temp > 100.5).   No facility-administered encounter medications on file as of 06/27/2018.     Review of Systems  GENERAL: No change in appetite, no fatigue, no weight changes, no fever, chills or weakness MOUTH and THROAT: Denies oral discomfort, gingival pain or bleeding RESPIRATORY: no cough, SOB, DOE, wheezing, hemoptysis CARDIAC: No chest pain, edema or palpitations GI: +constipation GU: Denies dysuria, frequency, hematuria, incontinence, or discharge NEUROLOGICAL: Denies dizziness, syncope, numbness, or headache PSYCHIATRIC: Denies feelings of depression or anxiety. No report of hallucinations, insomnia, paranoia, or agitation   Immunization History  Administered Date(s) Administered  .  Influenza, High Dose Seasonal PF 02/19/2018  . Influenza,inj,Quad PF,6+ Mos 04/09/2016, 02/14/2017  . Influenza-Unspecified 04/18/2014, 03/08/2015  . Pneumococcal Conjugate-13 08/30/2014  . Pneumococcal Polysaccharide-23 05/04/2011  . Tdap 04/12/2011   Pertinent  Health Maintenance Due  Topic Date Due  . LIPID PANEL  11/08/2018  . INFLUENZA VACCINE  Completed  . DEXA SCAN  Completed  . PNA vac Low Risk Adult  Completed   Fall Risk  03/31/2018 11/07/2017 04/24/2017 02/14/2017 10/24/2016  Falls in the past year? No Yes No Yes Yes  Number falls in past yr: - 2 or more - 2 or more 2 or more  Injury with Fall? - No - Yes No  Risk Factor Category  - - - High Fall Risk -  Risk for fall due to : - - - History of fall(s);Impaired balance/gait;Impaired vision -  Follow up - - - - Falls prevention discussed     Vitals:   06/27/18 1057  BP: 130/65  Pulse: 65  Resp: 16  Temp: 98.1 F (36.7 C)  TempSrc: Oral  SpO2: 97%  Weight:  152 lb 12.8 oz (69.3 kg)  Height: 5\' 1"  (1.549 m)   Body mass index is 28.87 kg/m.  Physical Exam  GENERAL APPEARANCE: Well nourished. In no acute distress. Normal body habitus SKIN:  Lower back midline surgical incision is dry MOUTH and THROAT: Lips are without lesions. Oral mucosa is moist and without lesions.  RESPIRATORY: Breathing is even & unlabored, BS CTAB CARDIAC: RRR, + murmur,no extra heart sounds, no edema GI: Abdomen soft, normal BS, no masses, no tenderness NEUROLOGICAL: There is no tremor. Speech is slightly slurred. Right-sided weakness. Alert and oriented X 3 PSYCHIATRIC:  Affect and behavior are appropriate   Labs reviewed: Recent Labs    03/04/18 1208 06/02/18 1521  NA 141 140  K 3.9 3.9  CL 101 102  CO2 24 29  GLUCOSE 116* 92  BUN 10 9  CREATININE 0.64 0.72  CALCIUM 9.4 9.3    Recent Labs    06/02/18 1521  WBC 11.0*  NEUTROABS 7.1  HGB 14.6  HCT 45.4  MCV 91.2  PLT 397   Lab Results  Component Value Date   TSH 2.260  11/07/2017   Lab Results  Component Value Date   HGBA1C 6.2 (A) 03/27/2018   Lab Results  Component Value Date   CHOL 165 11/07/2017   HDL 59 11/07/2017   LDLCALC 65 11/07/2017   LDLDIRECT 89.0 12/08/2014   TRIG 204 (H) 11/07/2017   CHOLHDL 2.8 11/07/2017    Significant Diagnostic Results in last 30 days:  Dg Lumbar Spine 2-3 Views  Result Date: 06/06/2018 CLINICAL DATA:  Posterior L4-5 fusion EXAM: DG C-ARM 61-120 MIN; LUMBAR SPINE - 2-3 VIEW COMPARISON:  CT 04/29/2018 FINDINGS: Intraoperative spot images demonstrate localization instruments posterior to the L5 pedicles followed by posterior fusion changes and pedicle placement at L4 and L5. No hardware bony complicating feature. IMPRESSION: Intraoperative imaging as above. Electronically Signed   By: Rolm Baptise M.D.   On: 06/06/2018 11:47   Dg C-arm 1-60 Min  Result Date: 06/06/2018 CLINICAL DATA:  Posterior L4-5 fusion EXAM: DG C-ARM 61-120 MIN; LUMBAR SPINE - 2-3 VIEW COMPARISON:  CT 04/29/2018 FINDINGS: Intraoperative spot images demonstrate localization instruments posterior to the L5 pedicles followed by posterior fusion changes and pedicle placement at L4 and L5. No hardware bony complicating feature. IMPRESSION: Intraoperative imaging as above. Electronically Signed   By: Rolm Baptise M.D.   On: 06/06/2018 11:47    Assessment/Plan  1. Degenerative spondylolisthesis - S/P L4-5 decompression and fusion surgery on 06/06/2018, continue acetaminophen 500 mg 1 tab every 4 hours as needed for pain, for Home health PT and OT for therapeutic strengthening exercises, fall precautions   2. Essential hypertension - amLODipine (NORVASC) 5 MG tablet; Take 1 tablet (5 mg total) by mouth daily for 30 days.  Dispense: 30 tablet; Refill: 0 - benazepril (LOTENSIN) 40 MG tablet; Take 1 tablet (40 mg total) by mouth daily.  Dispense: 30 tablet; Refill: 0 - hydrochlorothiazide (HYDRODIURIL) 25 MG tablet; Take 1 tablet (25 mg total) by mouth  daily for 30 days.  Dispense: 30 tablet; Refill: 0  3. History of stroke - amLODipine (NORVASC) 5 MG tablet; Take 1 tablet (5 mg total) by mouth daily for 30 days.  Dispense: 30 tablet; Refill: 0 - atorvastatin (LIPITOR) 10 MG tablet; Take 1 tablet (10 mg total) by mouth daily.  Dispense: 30 tablet; Refill: 0 - benazepril (LOTENSIN) 40 MG tablet; Take 1 tablet (40 mg total) by mouth daily.  Dispense: 30 tablet;  Refill: 0 - hydrochlorothiazide (HYDRODIURIL) 25 MG tablet; Take 1 tablet (25 mg total) by mouth daily for 30 days.  Dispense: 30 tablet; Refill: 0 - omega-3 acid ethyl esters (LOVAZA) 1 g capsule; Take 1 capsule (1 g total) by mouth 2 (two) times daily.  Dispense: 60 capsule; Refill: 0 - Aspirin 81 mg 1 tab daily   4. Depression, recurrent (HCC) - amitriptyline (ELAVIL) 50 MG tablet; Take 3 tablets (150 mg total) by mouth at bedtime.  Dispense: 90 tablet; Refill: 0  5. Neuropathy - gabapentin (NEURONTIN) 100 MG capsule; Take 1 capsule (100 mg total) by mouth 3 (three) times daily.  Dispense: 90 capsule; Refill: 0  6. Other specified hypothyroidism - levothyroxine (SYNTHROID, LEVOTHROID) 75 MCG tablet; Take 1 tablet (75 mcg total) by mouth daily.  Dispense: 30 tablet; Refill: 0 Lab Results  Component Value Date   TSH 2.260 11/07/2017    7. Slow transit constipation - will start on Senna-S 8.6-50 mg 2 tabs BID     I have filled out patient's discharge paperwork and written prescriptions.  Patient will receive home health PT, OT, ST, nursing and CNA.  DME provided:  None  Total discharge time: Greater than 30 minutes Greater than 50% was spent in counseling and coordination of care.    Discharge time involved coordination of the discharge process with social worker, nursing staff and therapy department. Medical justification for home health services verified.     Durenda Age, NP [facility] [phone] (Monday-Friday 8:00 a.m. -  5:00 p.m.) [phone] (after hours)

## 2018-07-01 ENCOUNTER — Other Ambulatory Visit: Payer: Self-pay

## 2018-07-01 DIAGNOSIS — M1990 Unspecified osteoarthritis, unspecified site: Secondary | ICD-10-CM | POA: Diagnosis not present

## 2018-07-01 NOTE — Patient Outreach (Signed)
[facility]) Care Management  07/01/2018  Yvonne [name] 08/07/1940 [id number]     Transition of Care Referral  Referral Date: 06/21/2018 Referral Source: [facility] Discharge Report Date of Admission:06/16/18 Diagnosis: s/p l4-5 decompression and fusion Date of Discharge: 06/28/2018 Facility: [place]: [facility] Medicare    Outreach attempt #1 to patient. No answer. RN CM left HIPAA compliant voicemail message along with contact info.    Plan: RN CM will make outreach attempt to patient within 3-4 business days. RN CM will send unsuccessful outreach letter to patient.   Enzo Montgomery, RN,BSN,CCM [place] [facility] Telephonic Care Management Coordinator Direct Phone: [phone] Toll Free: [phone] Fax: [phone]

## 2018-07-01 NOTE — Patient Outreach (Signed)
Yvonne [name] [facility]) Care Management  07/01/2018  Yvonne [name] April 29, 1941 [id number]    Transition of Care Referral  Referral Date: 06/21/2018 Referral Source: [facility] Discharge Report Date of Admission:06/16/18 Diagnosis: s/p l4-5 decompression and fusion Date of Discharge: 06/28/2018 Facility: [place]: [facility]   Incoming call from patient and caregiver. Patient voices she is HOH and requested that call be completed with her paid caregiver. Caregiver confirms that patient is doing well and things going well since patient returned home. [facility] services in place and has contacted patient. Patient has discharge paperwork. RN CM confirmed MD follow up appt in place and appt on 07/08/2018. Caregiver voices no issues with transportation as she takes patient to appts. Caregiver assists patient with managing meds. She voices that patient uses [employer] mail order for most meds and has little to no co pays as she has both Medicare and Medicaid. She states her discharge meds were called into local pharmacy this time. Caregiver did not wish to complete med review as she voices she manages meds without any issues or concerns.[facility] services reviewed and discussed with patient. Both of them deny needing any type of assistance at this time but appreciative of follow up call.     Plan: RN CM will close case at this time.   Enzo Montgomery, RN,BSN,CCM [place] [facility] Telephonic Care Management Coordinator Direct Phone: [phone] Toll Free: [phone] Fax: [phone]

## 2018-07-02 DIAGNOSIS — M1990 Unspecified osteoarthritis, unspecified site: Secondary | ICD-10-CM | POA: Diagnosis not present

## 2018-07-03 DIAGNOSIS — M1990 Unspecified osteoarthritis, unspecified site: Secondary | ICD-10-CM | POA: Diagnosis not present

## 2018-07-04 DIAGNOSIS — M1990 Unspecified osteoarthritis, unspecified site: Secondary | ICD-10-CM | POA: Diagnosis not present

## 2018-07-08 ENCOUNTER — Ambulatory Visit: Payer: Medicare HMO | Admitting: Family Medicine

## 2018-07-15 DIAGNOSIS — M1990 Unspecified osteoarthritis, unspecified site: Secondary | ICD-10-CM | POA: Diagnosis not present

## 2018-07-16 ENCOUNTER — Encounter: Payer: Self-pay | Admitting: Family Medicine

## 2018-07-16 ENCOUNTER — Ambulatory Visit (INDEPENDENT_AMBULATORY_CARE_PROVIDER_SITE_OTHER): Payer: Medicare HMO | Admitting: Family Medicine

## 2018-07-16 ENCOUNTER — Other Ambulatory Visit: Payer: Self-pay

## 2018-07-16 VITALS — BP 110/64 | HR 74 | Temp 98.0°F | Ht 61.0 in | Wt 150.0 lb

## 2018-07-16 DIAGNOSIS — I10 Essential (primary) hypertension: Secondary | ICD-10-CM | POA: Diagnosis not present

## 2018-07-16 DIAGNOSIS — Z8673 Personal history of transient ischemic attack (TIA), and cerebral infarction without residual deficits: Secondary | ICD-10-CM

## 2018-07-16 DIAGNOSIS — M4310 Spondylolisthesis, site unspecified: Secondary | ICD-10-CM | POA: Diagnosis not present

## 2018-07-16 DIAGNOSIS — F339 Major depressive disorder, recurrent, unspecified: Secondary | ICD-10-CM

## 2018-07-16 DIAGNOSIS — G629 Polyneuropathy, unspecified: Secondary | ICD-10-CM

## 2018-07-16 DIAGNOSIS — M1990 Unspecified osteoarthritis, unspecified site: Secondary | ICD-10-CM | POA: Diagnosis not present

## 2018-07-16 DIAGNOSIS — Z7409 Other reduced mobility: Secondary | ICD-10-CM | POA: Diagnosis not present

## 2018-07-16 DIAGNOSIS — H903 Sensorineural hearing loss, bilateral: Secondary | ICD-10-CM

## 2018-07-16 MED ORDER — GABAPENTIN 100 MG PO CAPS: 100.0000 mg | ORAL_CAPSULE | Freq: Three times a day (TID) | ORAL | 0 refills | Status: DC

## 2018-07-16 MED ORDER — OLMESARTAN MEDOXOMIL 40 MG PO TABS: 40.0000 mg | ORAL_TABLET | Freq: Every day | ORAL | 2 refills | Status: DC

## 2018-07-16 MED ORDER — OMEGA-3-ACID ETHYL ESTERS 1 G PO CAPS: 1.0000 | ORAL_CAPSULE | Freq: Two times a day (BID) | ORAL | 0 refills | Status: DC

## 2018-07-16 MED ORDER — AMITRIPTYLINE HCL 50 MG PO TABS: 150.0000 mg | ORAL_TABLET | Freq: Every day | ORAL | 0 refills | Status: DC

## 2018-07-16 NOTE — Assessment & Plan Note (Signed)
Far improved gait, will add PT for further gait training. No longer falling which is better.

## 2018-07-16 NOTE — Patient Instructions (Signed)
DASH Eating Plan  DASH stands for "Dietary Approaches to Stop Hypertension." The DASH eating plan is a healthy eating plan that has been shown to reduce high blood pressure (hypertension). It may also reduce your risk for type 2 diabetes, heart disease, and stroke. The DASH eating plan may also help with weight loss.  What are tips for following this plan?    General guidelines   Avoid eating more than 2,300 mg (milligrams) of salt (sodium) a day. If you have hypertension, you may need to reduce your sodium intake to 1,500 mg a day.   Limit alcohol intake to no more than 1 drink a day for nonpregnant women and 2 drinks a day for men. One drink equals 12 oz of beer, 5 oz of wine, or 1 oz of hard liquor.   Work with your health care provider to maintain a healthy body weight or to lose weight. Ask what an ideal weight is for you.   Get at least 30 minutes of exercise that causes your heart to beat faster (aerobic exercise) most days of the week. Activities may include walking, swimming, or biking.   Work with your health care provider or diet and nutrition specialist (dietitian) to adjust your eating plan to your individual calorie needs.  Reading food labels     Check food labels for the amount of sodium per serving. Choose foods with less than 5 percent of the Daily Value of sodium. Generally, foods with less than 300 mg of sodium per serving fit into this eating plan.   To find whole grains, look for the word "whole" as the first word in the ingredient list.  Shopping   Buy products labeled as "low-sodium" or "no salt added."   Buy fresh foods. Avoid canned foods and premade or frozen meals.  Cooking   Avoid adding salt when cooking. Use salt-free seasonings or herbs instead of table salt or sea salt. Check with your health care provider or pharmacist before using salt substitutes.   Do not fry foods. Cook foods using healthy methods such as baking, boiling, grilling, and broiling instead.   Cook with  heart-healthy oils, such as olive, canola, soybean, or sunflower oil.  Meal planning   Eat a balanced diet that includes:  ? 5 or more servings of fruits and vegetables each day. At each meal, try to fill half of your plate with fruits and vegetables.  ? Up to 6-8 servings of whole grains each day.  ? Less than 6 oz of lean meat, poultry, or fish each day. A 3-oz serving of meat is about the same size as a deck of cards. One egg equals 1 oz.  ? 2 servings of low-fat dairy each day.  ? A serving of nuts, seeds, or beans 5 times each week.  ? Heart-healthy fats. Healthy fats called Omega-3 fatty acids are found in foods such as flaxseeds and coldwater fish, like sardines, salmon, and mackerel.   Limit how much you eat of the following:  ? Canned or prepackaged foods.  ? Food that is high in trans fat, such as fried foods.  ? Food that is high in saturated fat, such as fatty meat.  ? Sweets, desserts, sugary drinks, and other foods with added sugar.  ? Full-fat dairy products.   Do not salt foods before eating.   Try to eat at least 2 vegetarian meals each week.   Eat more home-cooked food and less restaurant, buffet, and fast food.     When eating at a restaurant, ask that your food be prepared with less salt or no salt, if possible.  What foods are recommended?  The items listed may not be a complete list. Talk with your dietitian about what dietary choices are best for you.  Grains  Whole-grain or whole-wheat bread. Whole-grain or whole-wheat pasta. Brown rice. Oatmeal. Quinoa. Bulgur. Whole-grain and low-sodium cereals. Pita bread. Low-fat, low-sodium crackers. Whole-wheat flour tortillas.  Vegetables  Fresh or frozen vegetables (raw, steamed, roasted, or grilled). Low-sodium or reduced-sodium tomato and vegetable juice. Low-sodium or reduced-sodium tomato sauce and tomato paste. Low-sodium or reduced-sodium canned vegetables.  Fruits  All fresh, dried, or frozen fruit. Canned fruit in natural juice (without  added sugar).  Meat and other protein foods  Skinless chicken or turkey. Ground chicken or turkey. Pork with fat trimmed off. Fish and seafood. Egg whites. Dried beans, peas, or lentils. Unsalted nuts, nut butters, and seeds. Unsalted canned beans. Lean cuts of beef with fat trimmed off. Low-sodium, lean deli meat.  Dairy  Low-fat (1%) or fat-free (skim) milk. Fat-free, low-fat, or reduced-fat cheeses. Nonfat, low-sodium ricotta or cottage cheese. Low-fat or nonfat yogurt. Low-fat, low-sodium cheese.  Fats and oils  Soft margarine without trans fats. Vegetable oil. Low-fat, reduced-fat, or light mayonnaise and salad dressings (reduced-sodium). Canola, safflower, olive, soybean, and sunflower oils. Avocado.  Seasoning and other foods  Herbs. Spices. Seasoning mixes without salt. Unsalted popcorn and pretzels. Fat-free sweets.  What foods are not recommended?  The items listed may not be a complete list. Talk with your dietitian about what dietary choices are best for you.  Grains  Baked goods made with fat, such as croissants, muffins, or some breads. Dry pasta or rice meal packs.  Vegetables  Creamed or fried vegetables. Vegetables in a cheese sauce. Regular canned vegetables (not low-sodium or reduced-sodium). Regular canned tomato sauce and paste (not low-sodium or reduced-sodium). Regular tomato and vegetable juice (not low-sodium or reduced-sodium). Pickles. Olives.  Fruits  Canned fruit in a light or heavy syrup. Fried fruit. Fruit in cream or butter sauce.  Meat and other protein foods  Fatty cuts of meat. Ribs. Fried meat. Bacon. Sausage. Bologna and other processed lunch meats. Salami. Fatback. Hotdogs. Bratwurst. Salted nuts and seeds. Canned beans with added salt. Canned or smoked fish. Whole eggs or egg yolks. Chicken or turkey with skin.  Dairy  Whole or 2% milk, cream, and half-and-half. Whole or full-fat cream cheese. Whole-fat or sweetened yogurt. Full-fat cheese. Nondairy creamers. Whipped toppings.  Processed cheese and cheese spreads.  Fats and oils  Butter. Stick margarine. Lard. Shortening. Ghee. Bacon fat. Tropical oils, such as coconut, palm kernel, or palm oil.  Seasoning and other foods  Salted popcorn and pretzels. Onion salt, garlic salt, seasoned salt, table salt, and sea salt. Worcestershire sauce. Tartar sauce. Barbecue sauce. Teriyaki sauce. Soy sauce, including reduced-sodium. Steak sauce. Canned and packaged gravies. Fish sauce. Oyster sauce. Cocktail sauce. Horseradish that you find on the shelf. Ketchup. Mustard. Meat flavorings and tenderizers. Bouillon cubes. Hot sauce and Tabasco sauce. Premade or packaged marinades. Premade or packaged taco seasonings. Relishes. Regular salad dressings.  Where to find more information:   National Heart, Lung, and Blood Institute: www.nhlbi.nih.gov   American Heart Association: www.heart.org  Summary   The DASH eating plan is a healthy eating plan that has been shown to reduce high blood pressure (hypertension). It may also reduce your risk for type 2 diabetes, heart disease, and stroke.   With the   DASH eating plan, you should limit salt (sodium) intake to 2,300 mg a day. If you have hypertension, you may need to reduce your sodium intake to 1,500 mg a day.   When on the DASH eating plan, aim to eat more fresh fruits and vegetables, whole grains, lean proteins, low-fat dairy, and heart-healthy fats.   Work with your health care provider or diet and nutrition specialist (dietitian) to adjust your eating plan to your individual calorie needs.  This information is not intended to replace advice given to you by your health care provider. Make sure you discuss any questions you have with your health care provider.  Document Released: 05/24/2011 Document Revised: 05/28/2016 Document Reviewed: 05/28/2016  Elsevier Interactive Patient Education  2019 Elsevier Inc.

## 2018-07-16 NOTE — Assessment & Plan Note (Signed)
BP is well controlled on Norvasc, HCTZ. But with cough daily, will change from ACE-I to ARB.

## 2018-07-16 NOTE — Progress Notes (Signed)
   Subjective:    Patient ID: Yvonne [name] is a 78 y.o. 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 Had bilateral L4-5 decompressive laminotomies and foraminotomies and fusion. Notes no falling, no pain. Cannot go up and down stairs. Has cough daily--? Related to ACE-i  Review of Systems  Constitutional: Negative for chills and fever.  Respiratory: Negative for shortness of breath.   Cardiovascular: Negative for chest pain.  Gastrointestinal: Negative for abdominal pain, nausea and vomiting.  Genitourinary: Negative for dysuria.  Skin: Negative for rash.      Objective:    Ht 5\' 1"  (1.549 m)   Wt 150 lb (68 kg)   BMI 28.34 kg/m  Physical Exam Constitutional:      General: She is not in acute distress.    Appearance: She is well-developed.  HENT:     Head: Normocephalic and atraumatic.  Eyes:     General: No scleral icterus. Neck:     Musculoskeletal: Neck supple.  Cardiovascular:     Rate and Rhythm: Normal rate.  Pulmonary:     Effort: Pulmonary effort is normal.  Abdominal:     Palpations: Abdomen is soft.  Skin:    General: Skin is warm and dry.     Comments: Incision is well healed  Neurological:     Mental Status: She is alert and oriented to person, place, and time.         Assessment & Plan:   Problem List Items Addressed This Visit      Unprioritized   Essential hypertension - Primary    BP is well controlled on Norvasc, HCTZ. But with cough daily, will change from ACE-I to ARB.      Relevant Medications   omega-3 acid ethyl esters (LOVAZA) 1 g capsule   olmesartan (BENICAR) 40 MG tablet   Impaired functional mobility, balance, gait, and endurance   Degenerative spondylolisthesis    Far improved gait, will add PT for further gait training. No longer falling which is better.      Relevant Orders   Ambulatory referral to Physical Therapy   Sensorineural hearing loss (SNHL), bilateral    Got hearing aids, lost one behind the bed--but hearing is better.       Other Visit Diagnoses    Depression, recurrent ([place])       Relevant Medications   amitriptyline (ELAVIL) 50 MG tablet   Neuropathy       Relevant Medications   gabapentin (NEURONTIN) 100 MG capsule   History of stroke       Relevant Medications   omega-3 acid ethyl esters (LOVAZA) 1 g capsule      Total face-to-face time with patient: 25 minutes. Over 50% of encounter was spent on counseling and coordination of care. Return in about 4 weeks (around 08/13/2018) for a CPE.  Donnamae Jude 07/16/2018 10:12 AM  '

## 2018-07-16 NOTE — Assessment & Plan Note (Signed)
Got hearing aids, lost one behind the bed--but hearing is better.

## 2018-07-17 DIAGNOSIS — M1990 Unspecified osteoarthritis, unspecified site: Secondary | ICD-10-CM | POA: Diagnosis not present

## 2018-07-18 DIAGNOSIS — M1990 Unspecified osteoarthritis, unspecified site: Secondary | ICD-10-CM | POA: Diagnosis not present

## 2018-07-22 DIAGNOSIS — M1990 Unspecified osteoarthritis, unspecified site: Secondary | ICD-10-CM | POA: Diagnosis not present

## 2018-07-23 DIAGNOSIS — M1990 Unspecified osteoarthritis, unspecified site: Secondary | ICD-10-CM | POA: Diagnosis not present

## 2018-07-24 DIAGNOSIS — M1990 Unspecified osteoarthritis, unspecified site: Secondary | ICD-10-CM | POA: Diagnosis not present

## 2018-07-25 DIAGNOSIS — M1990 Unspecified osteoarthritis, unspecified site: Secondary | ICD-10-CM | POA: Diagnosis not present

## 2018-07-29 DIAGNOSIS — M1990 Unspecified osteoarthritis, unspecified site: Secondary | ICD-10-CM | POA: Diagnosis not present

## 2018-07-30 DIAGNOSIS — M1990 Unspecified osteoarthritis, unspecified site: Secondary | ICD-10-CM | POA: Diagnosis not present

## 2018-07-31 DIAGNOSIS — M1990 Unspecified osteoarthritis, unspecified site: Secondary | ICD-10-CM | POA: Diagnosis not present

## 2018-08-01 DIAGNOSIS — M1990 Unspecified osteoarthritis, unspecified site: Secondary | ICD-10-CM | POA: Diagnosis not present

## 2018-08-05 DIAGNOSIS — M1990 Unspecified osteoarthritis, unspecified site: Secondary | ICD-10-CM | POA: Diagnosis not present

## 2018-08-06 DIAGNOSIS — M1990 Unspecified osteoarthritis, unspecified site: Secondary | ICD-10-CM | POA: Diagnosis not present

## 2018-08-07 DIAGNOSIS — M1990 Unspecified osteoarthritis, unspecified site: Secondary | ICD-10-CM | POA: Diagnosis not present

## 2018-08-08 DIAGNOSIS — M1990 Unspecified osteoarthritis, unspecified site: Secondary | ICD-10-CM | POA: Diagnosis not present

## 2018-08-12 DIAGNOSIS — M1990 Unspecified osteoarthritis, unspecified site: Secondary | ICD-10-CM | POA: Diagnosis not present

## 2018-08-13 DIAGNOSIS — M1990 Unspecified osteoarthritis, unspecified site: Secondary | ICD-10-CM | POA: Diagnosis not present

## 2018-08-14 DIAGNOSIS — M1990 Unspecified osteoarthritis, unspecified site: Secondary | ICD-10-CM | POA: Diagnosis not present

## 2018-08-15 DIAGNOSIS — M1990 Unspecified osteoarthritis, unspecified site: Secondary | ICD-10-CM | POA: Diagnosis not present

## 2018-08-19 DIAGNOSIS — M1990 Unspecified osteoarthritis, unspecified site: Secondary | ICD-10-CM | POA: Diagnosis not present

## 2018-08-20 DIAGNOSIS — M1990 Unspecified osteoarthritis, unspecified site: Secondary | ICD-10-CM | POA: Diagnosis not present

## 2018-08-21 DIAGNOSIS — M1990 Unspecified osteoarthritis, unspecified site: Secondary | ICD-10-CM | POA: Diagnosis not present

## 2018-08-22 DIAGNOSIS — M1990 Unspecified osteoarthritis, unspecified site: Secondary | ICD-10-CM | POA: Diagnosis not present

## 2018-08-26 DIAGNOSIS — M1990 Unspecified osteoarthritis, unspecified site: Secondary | ICD-10-CM | POA: Diagnosis not present

## 2018-08-27 DIAGNOSIS — M1990 Unspecified osteoarthritis, unspecified site: Secondary | ICD-10-CM | POA: Diagnosis not present

## 2018-08-28 DIAGNOSIS — M1990 Unspecified osteoarthritis, unspecified site: Secondary | ICD-10-CM | POA: Diagnosis not present

## 2018-08-29 ENCOUNTER — Other Ambulatory Visit: Payer: Self-pay

## 2018-08-29 DIAGNOSIS — M1990 Unspecified osteoarthritis, unspecified site: Secondary | ICD-10-CM | POA: Diagnosis not present

## 2018-08-31 ENCOUNTER — Other Ambulatory Visit: Payer: Self-pay | Admitting: Family Medicine

## 2018-08-31 DIAGNOSIS — F339 Major depressive disorder, recurrent, unspecified: Secondary | ICD-10-CM

## 2018-09-01 NOTE — Telephone Encounter (Signed)
LVM asking to call back to clarify.

## 2018-09-02 DIAGNOSIS — M1990 Unspecified osteoarthritis, unspecified site: Secondary | ICD-10-CM | POA: Diagnosis not present

## 2018-09-02 NOTE — Telephone Encounter (Signed)
Attempted to call pt phone just kept ringing. Will call back later on today. Salvatore Marvel, CMA

## 2018-09-03 DIAGNOSIS — M1990 Unspecified osteoarthritis, unspecified site: Secondary | ICD-10-CM | POA: Diagnosis not present

## 2018-09-03 NOTE — Telephone Encounter (Signed)
Attempted to call pt . Phone just rang again. Will try again later. Salvatore Marvel, CMA

## 2018-09-03 NOTE — Telephone Encounter (Signed)
Attempted for the 3rd time to call pt. Again phone just rang, will try back later on today. Salvatore Marvel, CMA

## 2018-09-04 DIAGNOSIS — M1990 Unspecified osteoarthritis, unspecified site: Secondary | ICD-10-CM | POA: Diagnosis not present

## 2018-09-04 NOTE — Telephone Encounter (Signed)
LM for patient to call back.  Asked her to clarify which blood pressure medication she is currently taking.  Jazmin Hartsell,CMA

## 2018-09-05 DIAGNOSIS — M1990 Unspecified osteoarthritis, unspecified site: Secondary | ICD-10-CM | POA: Diagnosis not present

## 2018-09-08 NOTE — Telephone Encounter (Signed)
Pt called nurse line, she is taking both. Please advise.

## 2018-09-09 MED ORDER — BENAZEPRIL HCL 40 MG PO TABS: 40.0000 mg | ORAL_TABLET | Freq: Every day | ORAL | 3 refills | Status: DC

## 2018-09-22 ENCOUNTER — Other Ambulatory Visit: Payer: Self-pay | Admitting: *Deleted

## 2018-09-22 DIAGNOSIS — G629 Polyneuropathy, unspecified: Secondary | ICD-10-CM

## 2018-09-22 DIAGNOSIS — I10 Essential (primary) hypertension: Secondary | ICD-10-CM

## 2018-09-23 MED ORDER — GABAPENTIN 100 MG PO CAPS: 100.0000 mg | ORAL_CAPSULE | Freq: Three times a day (TID) | ORAL | 0 refills | Status: DC

## 2018-09-23 MED ORDER — OLMESARTAN MEDOXOMIL 40 MG PO TABS: 40.0000 mg | ORAL_TABLET | Freq: Every day | ORAL | 2 refills | Status: DC

## 2018-09-24 ENCOUNTER — Telehealth: Payer: Self-pay | Admitting: Physician Assistant

## 2018-09-24 ENCOUNTER — Telehealth: Payer: Self-pay | Admitting: *Deleted

## 2018-09-24 NOTE — Telephone Encounter (Signed)
  Patient is returning call regarding appointment

## 2018-09-24 NOTE — Telephone Encounter (Signed)
LMOVM TO CALL BACK TO R/S APPT

## 2018-09-26 ENCOUNTER — Telehealth: Payer: Self-pay | Admitting: Physician Assistant

## 2018-09-26 NOTE — Telephone Encounter (Signed)
°  4/10 :  Left VM for patient to return call. Call is regarding rescheduling 10/01/2018 OV to Virtual/MyChart/Phone visit.

## 2018-09-29 ENCOUNTER — Telehealth: Payer: Self-pay | Admitting: *Deleted

## 2018-09-29 NOTE — Telephone Encounter (Signed)
LMOVM TO CALL BACK APPT TO SET UP FOR VISIT

## 2018-09-30 ENCOUNTER — Telehealth: Payer: Self-pay | Admitting: Physician Assistant

## 2018-09-30 ENCOUNTER — Other Ambulatory Visit: Payer: Self-pay | Admitting: Family Medicine

## 2018-09-30 DIAGNOSIS — F339 Major depressive disorder, recurrent, unspecified: Secondary | ICD-10-CM

## 2018-09-30 NOTE — Telephone Encounter (Signed)
°  4/14 :  Left VM for patient to return my call.

## 2018-09-30 NOTE — Telephone Encounter (Signed)
°  4/14 : NEW MESSAGE  Returned patient's phone call regarding 4/15 visit. Patient did not answer. Left VM to return call back to office.

## 2018-09-30 NOTE — Telephone Encounter (Signed)
Follow up   Yvonne [name] is calling for the pt and says telephone visits or virtual visits is not an option for the pt   Please call back

## 2018-09-30 NOTE — Telephone Encounter (Signed)
New Message   Pt is returning a call about her appt   Please call back

## 2018-10-01 ENCOUNTER — Ambulatory Visit: Payer: Medicare HMO | Admitting: Physician Assistant

## 2018-10-23 ENCOUNTER — Other Ambulatory Visit: Payer: Self-pay

## 2018-10-23 DIAGNOSIS — G629 Polyneuropathy, unspecified: Secondary | ICD-10-CM

## 2018-10-23 MED ORDER — GABAPENTIN 100 MG PO CAPS: 100.0000 mg | ORAL_CAPSULE | Freq: Three times a day (TID) | ORAL | 0 refills | Status: DC

## 2018-10-30 ENCOUNTER — Other Ambulatory Visit: Payer: Self-pay | Admitting: Family Medicine

## 2018-10-30 DIAGNOSIS — G629 Polyneuropathy, unspecified: Secondary | ICD-10-CM

## 2018-10-30 MED ORDER — GABAPENTIN 100 MG PO CAPS: 100.0000 mg | ORAL_CAPSULE | Freq: Three times a day (TID) | ORAL | 3 refills | Status: DC

## 2018-11-18 DIAGNOSIS — M1990 Unspecified osteoarthritis, unspecified site: Secondary | ICD-10-CM | POA: Diagnosis not present

## 2018-11-19 DIAGNOSIS — M1990 Unspecified osteoarthritis, unspecified site: Secondary | ICD-10-CM | POA: Diagnosis not present

## 2018-11-20 DIAGNOSIS — M1990 Unspecified osteoarthritis, unspecified site: Secondary | ICD-10-CM | POA: Diagnosis not present

## 2018-11-21 DIAGNOSIS — M1990 Unspecified osteoarthritis, unspecified site: Secondary | ICD-10-CM | POA: Diagnosis not present

## 2018-11-25 DIAGNOSIS — M1990 Unspecified osteoarthritis, unspecified site: Secondary | ICD-10-CM | POA: Diagnosis not present

## 2018-11-26 DIAGNOSIS — M1990 Unspecified osteoarthritis, unspecified site: Secondary | ICD-10-CM | POA: Diagnosis not present

## 2018-11-27 DIAGNOSIS — M1990 Unspecified osteoarthritis, unspecified site: Secondary | ICD-10-CM | POA: Diagnosis not present

## 2018-11-28 DIAGNOSIS — M1990 Unspecified osteoarthritis, unspecified site: Secondary | ICD-10-CM | POA: Diagnosis not present

## 2018-12-14 ENCOUNTER — Other Ambulatory Visit: Payer: Self-pay | Admitting: Family Medicine

## 2018-12-14 DIAGNOSIS — Z8673 Personal history of transient ischemic attack (TIA), and cerebral infarction without residual deficits: Secondary | ICD-10-CM

## 2018-12-16 DIAGNOSIS — M1990 Unspecified osteoarthritis, unspecified site: Secondary | ICD-10-CM | POA: Diagnosis not present

## 2018-12-17 DIAGNOSIS — M1990 Unspecified osteoarthritis, unspecified site: Secondary | ICD-10-CM | POA: Diagnosis not present

## 2018-12-18 DIAGNOSIS — M1990 Unspecified osteoarthritis, unspecified site: Secondary | ICD-10-CM | POA: Diagnosis not present

## 2018-12-19 DIAGNOSIS — M1990 Unspecified osteoarthritis, unspecified site: Secondary | ICD-10-CM | POA: Diagnosis not present

## 2018-12-23 DIAGNOSIS — M1990 Unspecified osteoarthritis, unspecified site: Secondary | ICD-10-CM | POA: Diagnosis not present

## 2018-12-24 DIAGNOSIS — M1990 Unspecified osteoarthritis, unspecified site: Secondary | ICD-10-CM | POA: Diagnosis not present

## 2018-12-25 DIAGNOSIS — M1990 Unspecified osteoarthritis, unspecified site: Secondary | ICD-10-CM | POA: Diagnosis not present

## 2018-12-26 DIAGNOSIS — M1990 Unspecified osteoarthritis, unspecified site: Secondary | ICD-10-CM | POA: Diagnosis not present

## 2019-01-03 ENCOUNTER — Other Ambulatory Visit: Payer: Self-pay | Admitting: Family Medicine

## 2019-01-03 DIAGNOSIS — Z8673 Personal history of transient ischemic attack (TIA), and cerebral infarction without residual deficits: Secondary | ICD-10-CM

## 2019-01-21 ENCOUNTER — Other Ambulatory Visit: Payer: Self-pay | Admitting: Cardiovascular Disease

## 2019-01-21 DIAGNOSIS — Z8673 Personal history of transient ischemic attack (TIA), and cerebral infarction without residual deficits: Secondary | ICD-10-CM

## 2019-01-21 DIAGNOSIS — I10 Essential (primary) hypertension: Secondary | ICD-10-CM

## 2019-01-21 IMAGING — CT CT L SPINE W/O CM
4 of 5 series · 13 of 33 positions shown, 15 images · non-contrast
Comparison: None.

CLINICAL DATA: Bilateral leg and foot numbness.

EXAM:
CT LUMBAR SPINE WITHOUT CONTRAST
TECHNIQUE: Multidetector CT imaging of the lumbar spine was performed without
intravenous contrast administration. Multiplanar CT image
reconstructions were also generated.

[Series 4: l-spine 2.00 br60 s3 lspine bone · axial · 0.31mm/px · z∈[+1325,+1435]mm · 3 of 111 slices shown, 4 images]
[im 28/111  soft-tissue]
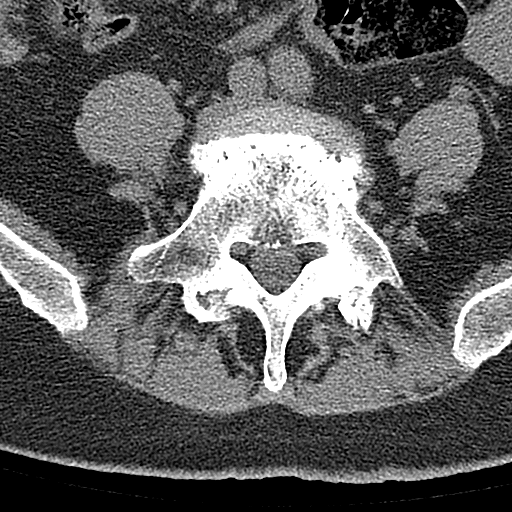
[im 28/111  bone]
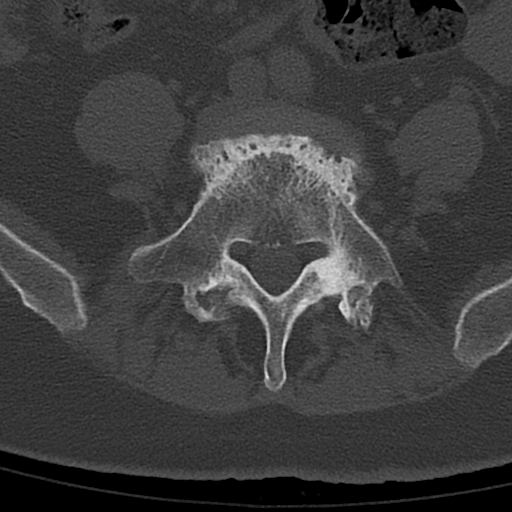
[im 56/111  bone]
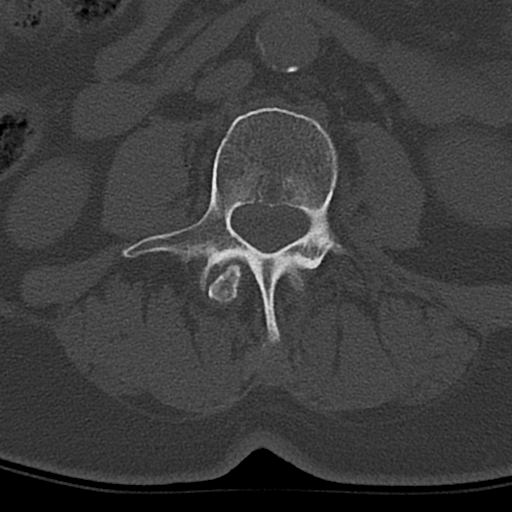
[im 83/111  bone]
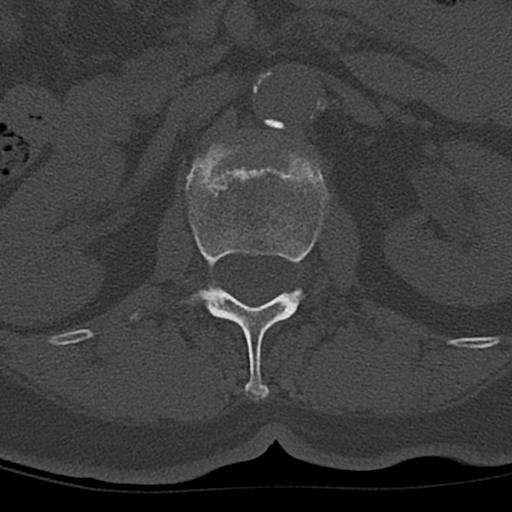

[Series 5: l-spine 2.00 br60 s3 sag sag bone · sagittal · 0.31mm/px · 5 of 79 slices shown, 6 images]
[im 27/79  bone]
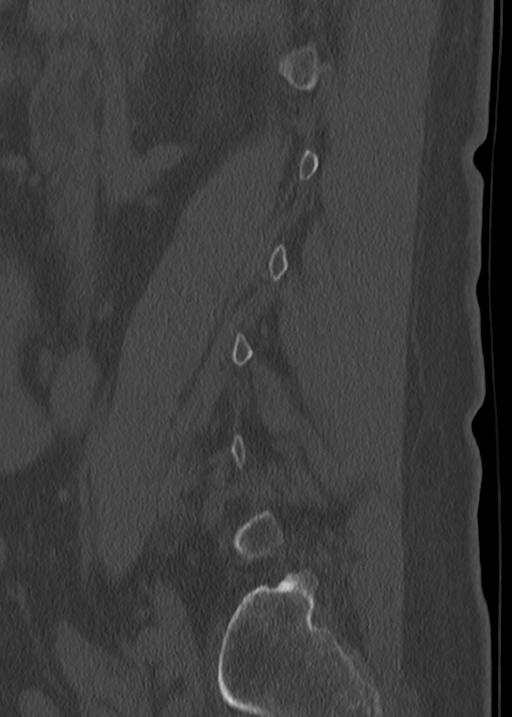
[im 33/79  bone]
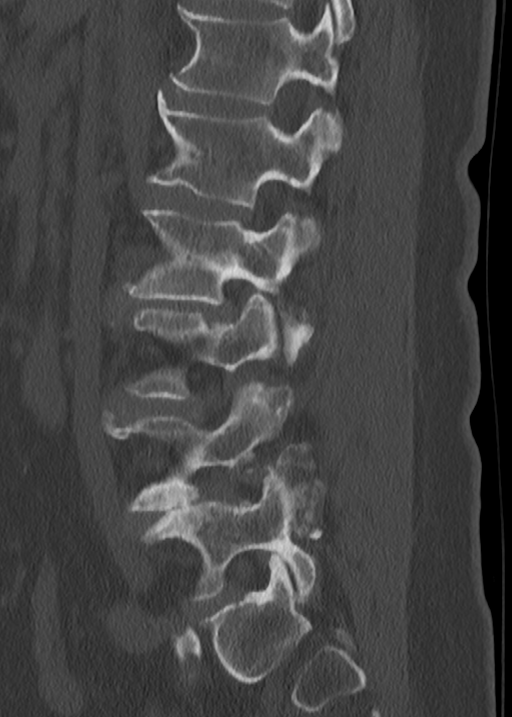
[im 40/79  soft-tissue]
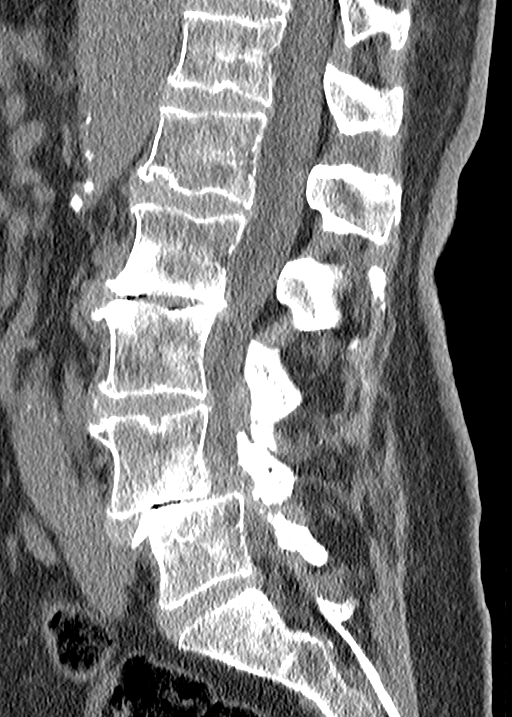
[im 40/79  bone]
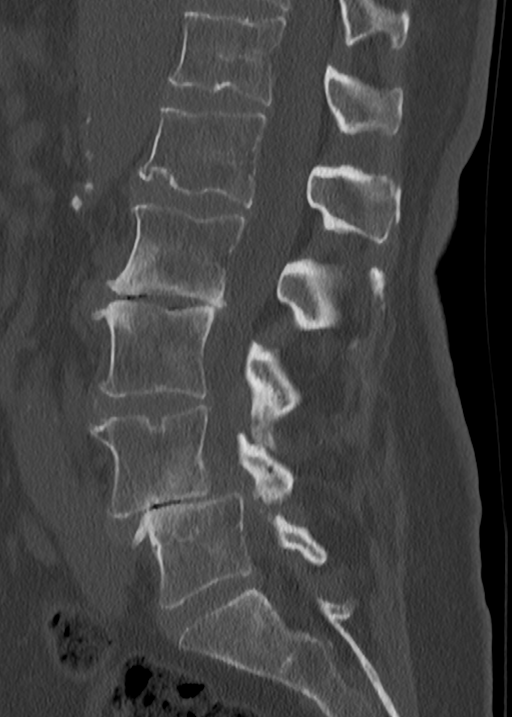
[im 46/79  bone]
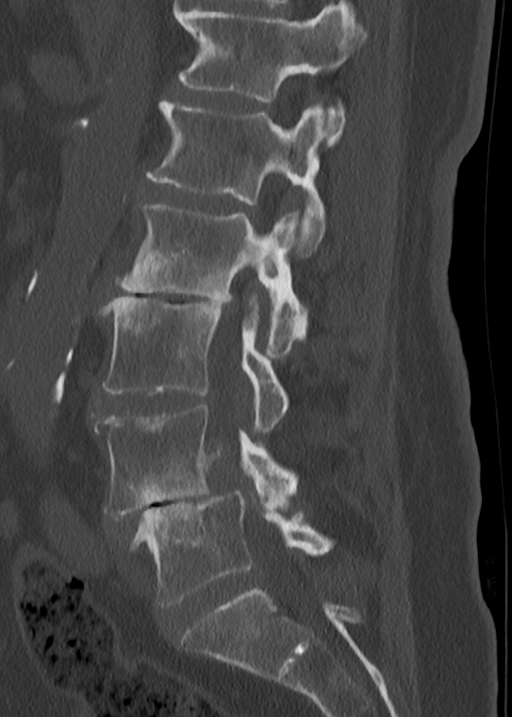
[im 53/79  bone]
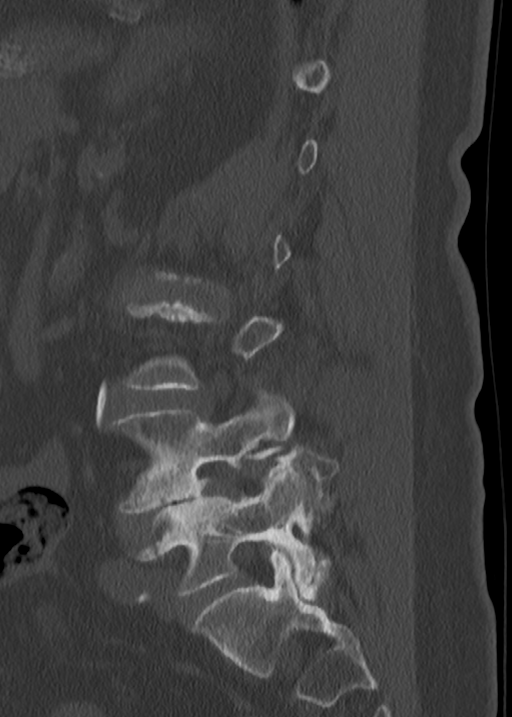

[Series 7: l-spine 2.00 br60 s3 cor cor bone · coronal · 0.31mm/px · 3 of 79 slices shown]
[im 16/79  bone]
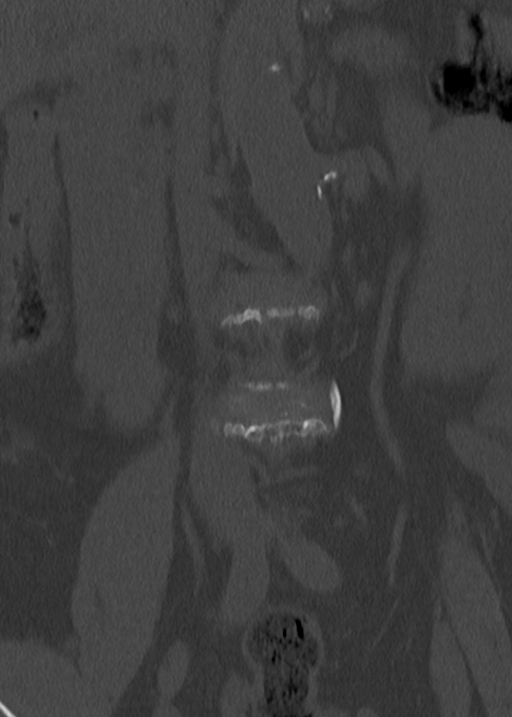
[im 32/79  bone]
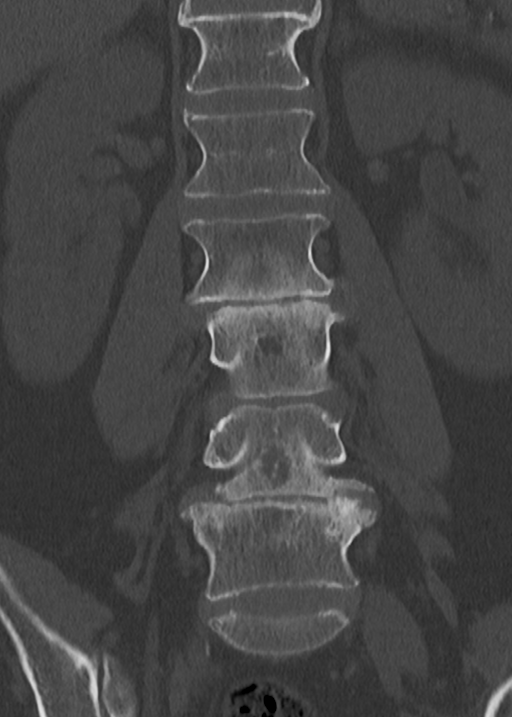
[im 47/79  bone]
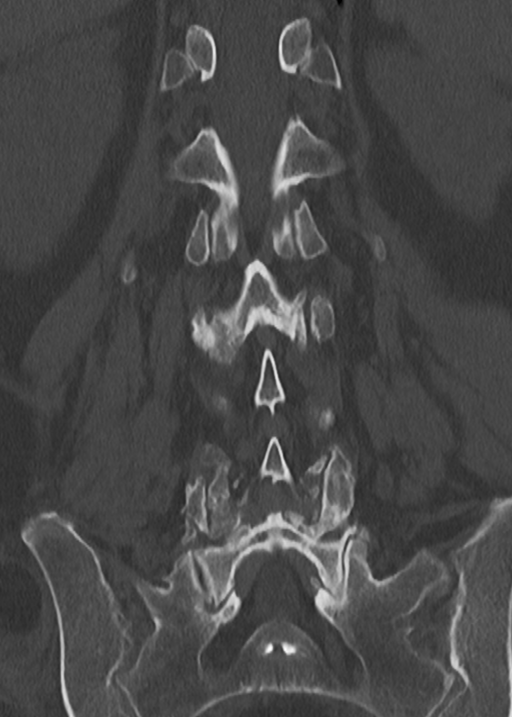

[Series 10: l-spine 2.00 br40 s3 axial true axials · axial · 0.31mm/px · z∈[+1302,+1375]mm · 2 of 112 slices shown]
[im 28/112  bone]
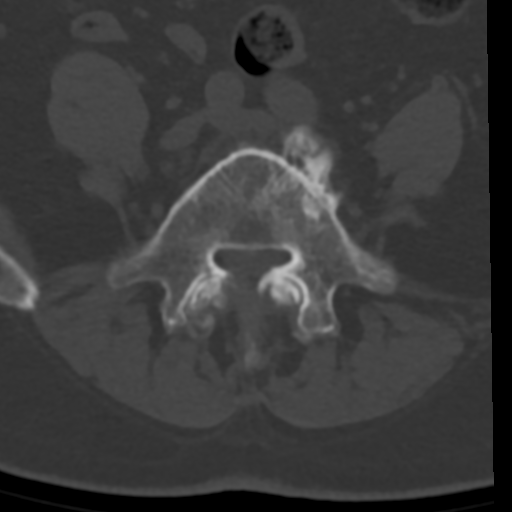
[im 56/112  bone]
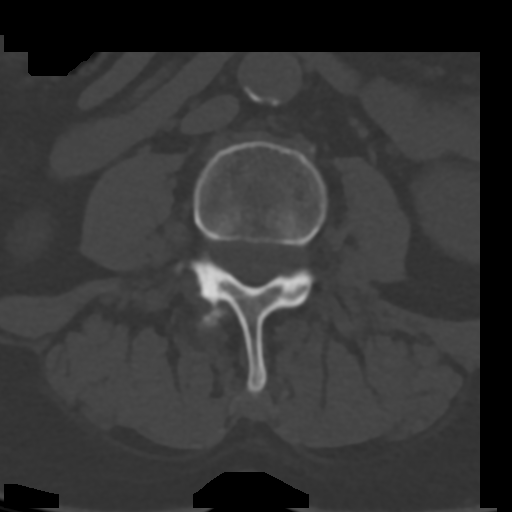

[13 of 33 positions shown; findings below may reference images not displayed]

FINDINGS: Segmentation: 5 lumbar type vertebrae.

Alignment: Minimal retrolisthesis of L2 on L3. Grade 2
anterolisthesis of L4 on L5 secondary to severe facet disease.

Vertebrae: Mild chronic L1 vertebral body compression fracture.
Remainder the vertebral body heights are maintained.

Paraspinal and other soft tissues: No acute paraspinal abnormality.
Abdominal aortic atherosclerosis. Punctate nonobstructing left renal
calculus.

Disc levels:

Degenerative disease with severe disc height loss at L2-3 and L4-5.

T12-L1: No disc protrusion, foraminal stenosis or central canal
stenosis. Mild bilateral facet arthropathy.

L1-L2: Mild broad-based disc bulge. Mild bilateral facet
arthropathy. No foraminal stenosis.

L2-L3: Broad-based disc bulge. Mild bilateral facet arthropathy.
Mild spinal stenosis. Bilateral lateral recess stenosis. Mild left
foraminal stenosis.

L3-L4: Broad-based disc bulge. Moderate bilateral facet arthropathy.
Mild spinal stenosis and bilateral lateral recess stenosis. No
foraminal stenosis.

L4-L5: Large central disc extrusion with cephalad migration of disc
material and mineralization of the disc. Severe bilateral facet
arthropathy. Severe spinal stenosis. Moderate bilateral foraminal
stenosis.

L5-S1: Broad-based disc bulge. Mild bilateral facet arthropathy. No
foraminal stenosis.
IMPRESSION: 1.  No acute osseous injury of the lumbar spine.
2. At L4-5 there is a large central disc extrusion with cephalad
migration of disc material and mineralization of the disc. Severe
bilateral facet arthropathy. Severe spinal stenosis. Moderate
bilateral foraminal stenosis.
3.  Aortic Atherosclerosis ([id number]).

## 2019-01-29 ENCOUNTER — Telehealth: Payer: Self-pay | Admitting: *Deleted

## 2019-01-29 NOTE — Telephone Encounter (Signed)
Pt is aware appt has been changed to a [place].  This pt is very hard of hearing.         Virtual Visit Pre-Appointment Phone Call  "(Name), I am calling you today to discuss your upcoming appointment. We are currently trying to limit exposure to the virus that causes COVID-19 by seeing patients at home rather than in the office."  1. "What is the BEST phone number to call the day of the visit?" - include this in appointment notes  2. "Do you have or have access to (through a family member/friend) a smartphone with video capability that we can use for your visit?" a. If yes - list this number in appt notes as "cell" (if different from BEST phone #) and list the appointment type as a VIDEO visit in appointment notes b. If no - list the appointment type as a PHONE visit in appointment notes  3. Confirm consent - "In the setting of the current Covid19 crisis, you are scheduled for a (phone ) visit with your provider on (Monday Aug 17) at (time).  Just as we do with many in-office visits, in order for you to participate in this visit, we must obtain consent.  If you'd like, I can send this to your mychart (if signed up) or email for you to review.  Otherwise, I can obtain your verbal consent now.  All virtual visits are billed to your insurance company just like a normal visit would be.  By agreeing to a virtual visit, we'd like you to understand that the technology does not allow for your provider to perform an examination, and thus may limit your provider's ability to fully assess your condition. If your provider identifies any concerns that need to be evaluated in person, we will make arrangements to do so.  Finally, though the technology is pretty good, we cannot assure that it will always work on either your or our end, and in the setting of a video visit, we may have to convert it to a phone-only visit.  In either situation, we cannot ensure that we have a secure connection.  Are you willing to  proceed?" STAFF: Did the patient verbally acknowledge consent to telehealth visit? Document YES/NO here: yes  4. Advise patient to be prepared - "Two hours prior to your appointment, go ahead and check your blood pressure, pulse, oxygen saturation, and your weight (if you have the equipment to check those) and write them all down. When your visit starts, your provider will ask you for this information. If you have an Apple Watch or Kardia device, please plan to have heart rate information ready on the day of your appointment. Please have a pen and paper handy nearby the day of the visit as well."  5. Give patient instructions for MyChart download to smartphone OR [email or web address] as below if video visit (depending on what platform provider is using)  6. Inform patient they will receive a phone call 15 minutes prior to their appointment time (may be from unknown caller ID) so they should be prepared to answer    TELEPHONE CALL NOTE  OLLA [name] has been deemed a candidate for a follow-up tele-health visit to limit community exposure during the Covid-19 pandemic. I spoke with the patient via phone to ensure availability of phone/video source, confirm preferred email & phone number, and discuss instructions and expectations.  I reminded Yvonne [name] to be prepared with any vital sign and/or heart rhythm information that  could potentially be obtained via home monitoring, at the time of her visit. I reminded Yvonne [name] to expect a phone call prior to her visit.  Gust Eugene [name] 01/29/2019 11:31 AM   INSTRUCTIONS FOR DOWNLOADING THE MYCHART APP TO SMARTPHONE  - The patient must first make sure to have activated MyChart and know their login information - If Apple, go to [employer] and type in MyChart in the search bar and download the app. If Android, ask patient to go to [employer] and type in [place] in the search bar and download the app. The app is free but as with any other  app downloads, their phone may require them to verify saved payment information or Apple/Android password.  - The patient will need to then log into the app with their MyChart username and password, and select [facility] as their healthcare provider to link the account. When it is time for your visit, go to the MyChart app, find appointments, and click Begin Video Visit. Be sure to Select Allow for your device to access the Microphone and Camera for your visit. You will then be connected, and your provider will be with you shortly.  **If they have any issues connecting, or need assistance please contact MyChart service desk [phone])**  **If using a computer, in order to ensure the best quality for their visit they will need to use either of the following Internet Browsers: [employer], or Google Chrome**  IF USING DOXIMITY or DOXY.ME - The patient will receive a link just prior to their visit by text.     FULL LENGTH CONSENT FOR TELE-HEALTH VISIT   I hereby voluntarily request, consent and authorize [facility] and its employed or contracted physicians, physician assistants, nurse practitioners or other licensed health care professionals (the Practitioner), to provide me with telemedicine health care services (the "Services") as deemed necessary by the treating Practitioner. I acknowledge and consent to receive the Services by the Practitioner via telemedicine. I understand that the telemedicine visit will involve communicating with the Practitioner through live audiovisual communication technology and the disclosure of certain medical information by electronic transmission. I acknowledge that I have been given the opportunity to request an in-person assessment or other available alternative prior to the telemedicine visit and am voluntarily participating in the telemedicine visit.  I understand that I have the right to withhold or withdraw my consent to the use of  telemedicine in the course of my care at any time, without affecting my right to future care or treatment, and that the Practitioner or I may terminate the telemedicine visit at any time. I understand that I have the right to inspect all information obtained and/or recorded in the course of the telemedicine visit and may receive copies of available information for a reasonable fee.  I understand that some of the potential risks of receiving the Services via telemedicine include:  Marland Kitchen Delay or interruption in medical evaluation due to technological equipment failure or disruption; . Information transmitted may not be sufficient (e.g. poor resolution of images) to allow for appropriate medical decision making by the Practitioner; and/or  . In rare instances, security protocols could fail, causing a breach of personal health information.  Furthermore, I acknowledge that it is my responsibility to provide information about my medical history, conditions and care that is complete and accurate to the best of my ability. I acknowledge that Practitioner's advice, recommendations, and/or decision may be based on factors not within  their control, such as incomplete or inaccurate data provided by me or distortions of diagnostic images or specimens that may result from electronic transmissions. I understand that the practice of medicine is not an exact science and that Practitioner makes no warranties or guarantees regarding treatment outcomes. I acknowledge that I will receive a copy of this consent concurrently upon execution via email to the email address I last provided but may also request a printed copy by calling the office of [facility].    I understand that my insurance will be billed for this visit.   I have read or had this consent read to me. . I understand the contents of this consent, which adequately explains the benefits and risks of the Services being provided via telemedicine.  . I have been  provided ample opportunity to ask questions regarding this consent and the Services and have had my questions answered to my satisfaction. . I give my informed consent for the services to be provided through the use of telemedicine in my medical care  By participating in this telemedicine visit I agree to the above.

## 2019-01-29 NOTE — Telephone Encounter (Signed)
-----   Message from Liliane Shi, [place] sent at 01/29/2019 11:00 AM EDT ----- Regarding: Mon 8/17 She is ok to be seen as a virtual visit. Thanks [employer]

## 2019-02-02 ENCOUNTER — Telehealth (INDEPENDENT_AMBULATORY_CARE_PROVIDER_SITE_OTHER): Payer: Medicare HMO | Admitting: Physician Assistant

## 2019-02-02 ENCOUNTER — Other Ambulatory Visit: Payer: Self-pay

## 2019-02-02 ENCOUNTER — Telehealth: Payer: Self-pay | Admitting: *Deleted

## 2019-02-02 ENCOUNTER — Encounter: Payer: Self-pay | Admitting: Physician Assistant

## 2019-02-02 VITALS — Ht 61.0 in | Wt 150.0 lb

## 2019-02-02 DIAGNOSIS — Z7189 Other specified counseling: Secondary | ICD-10-CM

## 2019-02-02 DIAGNOSIS — I10 Essential (primary) hypertension: Secondary | ICD-10-CM | POA: Diagnosis not present

## 2019-02-02 DIAGNOSIS — I779 Disorder of arteries and arterioles, unspecified: Secondary | ICD-10-CM

## 2019-02-02 DIAGNOSIS — E785 Hyperlipidemia, unspecified: Secondary | ICD-10-CM

## 2019-02-02 DIAGNOSIS — I062 Rheumatic aortic stenosis with insufficiency: Secondary | ICD-10-CM

## 2019-02-02 MED ORDER — OMEGA-3-ACID ETHYL ESTERS 1 G PO CAPS: 1.0000 | ORAL_CAPSULE | Freq: Two times a day (BID) | ORAL | 11 refills | Status: DC

## 2019-02-02 NOTE — Telephone Encounter (Signed)
PA required on Amitriptyline  PA not required on amoxapine 50mg  or maprotiline 75mg .   To PCP to advise. Christen Bame, CMA

## 2019-02-02 NOTE — Patient Instructions (Signed)
Medication Instructions:  Your physician recommends that you continue on your current medications as directed. Please refer to the Current Medication list given to you today.  If you need a refill on your cardiac medications before your next appointment, please call your pharmacy.   Lab work: FUTURE: LIPIDS & CMET  If you have labs (blood work) drawn today and your tests are completely normal, you will receive your results only by: Marland Kitchen MyChart Message (if you have MyChart) OR . A paper copy in the mail If you have any lab test that is abnormal or we need to change your treatment, we will call you to review the results.  Testing/Procedures: Your physician has requested that you have a carotid duplex. This test is an ultrasound of the carotid arteries in your neck. It looks at blood flow through these arteries that supply the brain with blood. Allow one hour for this exam. There are no restrictions or special instructions.    Follow-Up: At [facility], you and your health needs are our priority.  As part of our continuing mission to provide you with exceptional heart care, we have created designated Provider Care Teams.  These Care Teams include your primary Cardiologist (physician) and Advanced Practice Providers (APPs -  Physician Assistants and Nurse Practitioners) who all work together to provide you with the care you need, when you need it. You will need a follow up appointment in:  6 months.  Please call our office 2 months in advance to schedule this appointment.  You may see Mertie Moores, MD or one of the following Advanced Practice Providers on your designated Care Team: Richardson Dopp, PA-C  Any Other Special Instructions Will Be Listed Below (If Applicable).  Please check your blood pressure one time a day for the next week then call our office with the readings

## 2019-02-02 NOTE — Progress Notes (Signed)
Virtual Visit via Telephone Note   This visit type was conducted due to national recommendations for restrictions regarding the COVID-19 Pandemic (e.g. social distancing) in an effort to limit this patient's exposure and mitigate transmission in our community.  Due to her co-morbid illnesses, this patient is at least at moderate risk for complications without adequate follow up.  This format is felt to be most appropriate for this patient at this time.  The patient did not have access to video technology/had technical difficulties with video requiring transitioning to audio format only (telephone).  All issues noted in this document were discussed and addressed.  No physical exam could be performed with this format.  Please refer to the patient's chart for her  consent to telehealth for [facility].   Date:  02/02/2019   ID:  Yvonne [name], DOB 1940-07-28, MRN [id number]  Patient Location: Home Provider Location: Home  PCP:  Donnamae Jude, MD  Cardiologist:  Mertie Moores, MD   Electrophysiologist:  None   Evaluation Performed:  Follow-Up Visit  Chief Complaint: Carotid artery disease, hypertension, hyperlipidemia  History of Present Illness:    Yvonne [name] is a 78 y.o. female with:  Aortic valve disease  Hypertension  History of CVA  Occluded left carotid artery  Hypothyroidism   Yvonne [name] was last seen by Dr. Acie Fredrickson in August 2019.  Today, she notes she is doing well.  She had hip surgery back in December and has felt well since then.  She is no longer on benazepril for unclear reasons.  Her primary care provider changed her over to [place] in January.  However, she is not currently taking that medication.  She has not checked her blood pressure recently.  She has not had chest pain, significant shortness of breath, syncope, orthopnea or leg swelling.  The patient does not have symptoms concerning for COVID-19 infection (fever, chills, cough, or new shortness of  breath).    Past Medical History:  Diagnosis Date   Allergy    Anemia    Anxiety    Aortic stenosis    mild by 07/2011 echo    Arthritis    Heart murmur    Hypertension    Hypothyroidism    Impaired functional mobility, balance, gait, and endurance 09/02/2015   Medical history non-contributory    Mild cognitive impairment with memory loss 09/02/2015   Pneumonia    Stroke ([place])    pt was 40   Past Surgical History:  Procedure Laterality Date   ABDOMINAL HYSTERECTOMY     BRAIN SURGERY     age 48's   JOINT REPLACEMENT     NO PAST SURGERIES     TOE AMPUTATION  2013   2nd toe on right foot, hammer toe   TONSILLECTOMY     TOTAL HIP ARTHROPLASTY Left 03/19/2017   Procedure: LEFT TOTAL HIP ARTHROPLASTY ANTERIOR APPROACH;  Surgeon: Mcarthur Rossetti, MD;  Location: [facility];  Service: Orthopedics;  Laterality: Left;     Current Meds  Medication Sig   acetaminophen (TYLENOL) 500 MG tablet Take 500 mg by mouth every 4 (four) hours as needed for mild pain.   amLODipine (NORVASC) 5 MG tablet Take 1 tablet (5 mg total) by mouth daily for 30 days.   aspirin 81 MG chewable tablet Chew 81 mg by mouth daily.    atorvastatin (LIPITOR) 10 MG tablet Take 1 tablet (10 mg total) by mouth daily.   gabapentin (NEURONTIN) 100 MG capsule Take  1 capsule (100 mg total) by mouth 3 (three) times daily.   Glucosamine-Chondroitin (OSTEO BI-FLEX REGULAR STRENGTH PO) Take 2 tablets by mouth daily.   hydrochlorothiazide (HYDRODIURIL) 25 MG tablet TAKE 1 TABLET EVERY DAY   levothyroxine (SYNTHROID, LEVOTHROID) 75 MCG tablet Take 1 tablet (75 mcg total) by mouth daily.   lidocaine-prilocaine (EMLA) cream Apply up to 2 grams three times a day as needed for pain.   Multiple Vitamin (MULTIVITAMIN WITH MINERALS) TABS tablet Take 1 tablet by mouth daily with breakfast.   polyethylene glycol (MIRALAX / GLYCOLAX) packet Take 17 g by mouth daily as needed for mild constipation.    senna-docusate (SENOKOT-S) 8.6-50 MG tablet Take 2 tablets by mouth 2 (two) times daily.     Allergies:   Patient has no known allergies.   Social History   Tobacco Use   Smoking status: Never Smoker   Smokeless tobacco: Never Used  Substance Use Topics   Alcohol use: Yes    Alcohol/week: 1.0 standard drinks    Types: 1 Glasses of wine per week   Drug use: No     Family Hx: The patient's family history includes Heart disease in her father; Thyroid disease in her mother.  ROS:   Please see the history of present illness.     All other systems reviewed and are negative.   Prior CV studies:   The following studies were reviewed today:   Carotid US 6/38/7564 R ICA 3-32; LICA chronic occlusion Follow-up 2 years  Echocardiogram 08/15/2011 Moderate LVH, EF 95-18, grade 1 diastolic dysfunction, mild AI, mild AS (mean 14, peak 30), MAC, mild LAE, trivial pericardial effusion   Labs/Other Tests and Data Reviewed:    EKG:  No ECG reviewed.  Recent Labs: 06/02/2018: BUN 9; Creatinine, Ser 0.72; Hemoglobin 14.6; Platelets 397; Potassium 3.9; Sodium 140   Recent Lipid Panel Lab Results  Component Value Date/Time   CHOL 165 11/07/2017 02:40 PM   TRIG 204 (H) 11/07/2017 02:40 PM   HDL 59 11/07/2017 02:40 PM   CHOLHDL 2.8 11/07/2017 02:40 PM   CHOLHDL 3.1 07/25/2016 11:55 AM   LDLCALC 65 11/07/2017 02:40 PM   LDLDIRECT 89.0 12/08/2014 10:19 AM     Wt Readings from Last 3 Encounters:  02/02/19 150 lb (68 kg)  07/16/18 150 lb (68 kg)  06/27/18 152 lb 12.8 oz (69.3 kg)     Objective:    Vital Signs:  Ht _0  (1.549 m)    Wt 150 lb (68 kg)    BMI 28.34 kg/m    VITAL SIGNS:  reviewed GEN:  no acute distress RESPIRATORY:  No labored breathing NEURO:  Alert and oriented PSYCH:  Normal mood   ASSESSMENT & PLAN:    1. Essential hypertension The patient is unable to check her blood pressure.  However, she does have a machine at home.  She is no longer on  benazepril or Benicar.  I have asked her to get her blood pressure cuff now and check her pressure once a day for the next week.  She should call with those readings.  If her pressure is uncontrolled, we will adjust her therapy.  Arrange follow-up CMET prior to next visit in 6 months.  2. Bilateral carotid artery disease, unspecified type ([place]) History of prior stroke.  She has a history of a chronically occluded left internal carotid artery.  She had mild disease by carotid ultrasound in 2018.  She is due for follow-up.  This will be arranged.  Continue aspirin, statin.  3. Hyperlipidemia, unspecified hyperlipidemia type Continue statin therapy.  Arrange follow-up CMET, fasting lipids prior to next visit in 6 months.  4. Aortic valve stenosis and insufficiency, rheumatic Mild by echocardiogram in 2016.  No symptoms to suggest significant worsening.  5. Educated About Covid-19 Virus Infection The signs and symptoms of COVID-19 were discussed with the patient and how to seek care for testing (follow up with PCP or arrange E-visit).  The importance of social distancing was discussed today.  Time:   Today, I have spent 15 minutes with the patient with telehealth technology discussing the above problems.     Medication Adjustments/Labs and Tests Ordered: Current medicines are reviewed at length with the patient today.  Concerns regarding medicines are outlined above.   Tests Ordered: Orders Placed This Encounter  Procedures   Lipid panel   Comprehensive metabolic panel    Medication Changes: Meds ordered this encounter  Medications   omega-3 acid ethyl esters (LOVAZA) 1 g capsule    Sig: Take 1 capsule (1 g total) by mouth 2 (two) times daily.    Dispense:  60 capsule    Refill:  11    Follow Up:  In Person in 6 month(s)  Signed, Richardson Dopp, PA-C  02/02/2019 4:00 PM    [place]

## 2019-02-03 ENCOUNTER — Encounter (HOSPITAL_COMMUNITY): Payer: Medicare HMO

## 2019-02-04 ENCOUNTER — Other Ambulatory Visit: Payer: Self-pay | Admitting: Family Medicine

## 2019-02-04 DIAGNOSIS — E038 Other specified hypothyroidism: Secondary | ICD-10-CM

## 2019-02-04 DIAGNOSIS — Z8673 Personal history of transient ischemic attack (TIA), and cerebral infarction without residual deficits: Secondary | ICD-10-CM

## 2019-02-04 DIAGNOSIS — I10 Essential (primary) hypertension: Secondary | ICD-10-CM

## 2019-02-05 NOTE — Telephone Encounter (Signed)
LMOVM of pt and sent message to pharmacy. Christen Bame, CMA

## 2019-02-06 DIAGNOSIS — Z1231 Encounter for screening mammogram for malignant neoplasm of breast: Secondary | ICD-10-CM | POA: Diagnosis not present

## 2019-02-10 ENCOUNTER — Other Ambulatory Visit: Payer: Self-pay

## 2019-02-10 ENCOUNTER — Encounter: Payer: Self-pay | Admitting: Family Medicine

## 2019-02-10 ENCOUNTER — Encounter (HOSPITAL_COMMUNITY): Payer: Medicare HMO

## 2019-02-10 ENCOUNTER — Ambulatory Visit (INDEPENDENT_AMBULATORY_CARE_PROVIDER_SITE_OTHER): Payer: Medicare HMO | Admitting: Family Medicine

## 2019-02-10 DIAGNOSIS — G629 Polyneuropathy, unspecified: Secondary | ICD-10-CM | POA: Diagnosis not present

## 2019-02-10 DIAGNOSIS — R200 Anesthesia of skin: Secondary | ICD-10-CM

## 2019-02-10 DIAGNOSIS — F339 Major depressive disorder, recurrent, unspecified: Secondary | ICD-10-CM | POA: Diagnosis not present

## 2019-02-10 DIAGNOSIS — E038 Other specified hypothyroidism: Secondary | ICD-10-CM | POA: Diagnosis not present

## 2019-02-10 DIAGNOSIS — R739 Hyperglycemia, unspecified: Secondary | ICD-10-CM

## 2019-02-10 HISTORY — DX: Major depressive disorder, recurrent, unspecified: F33.9

## 2019-02-10 LAB — HEMOGLOBIN A1C: A1c: 6.1

## 2019-02-10 MED ORDER — LEVOTHYROXINE SODIUM 75 MCG PO TABS: 75.0000 ug | ORAL_TABLET | Freq: Every day | ORAL | 3 refills | Status: DC

## 2019-02-10 MED ORDER — GABAPENTIN 100 MG PO CAPS: 100.0000 mg | ORAL_CAPSULE | Freq: Three times a day (TID) | ORAL | 3 refills | Status: DC

## 2019-02-10 MED ORDER — AMITRIPTYLINE HCL 50 MG PO TABS: 150.0000 mg | ORAL_TABLET | Freq: Every day | ORAL | 0 refills | Status: DC

## 2019-02-10 MED ORDER — OMEGA-3-ACID ETHYL ESTERS 1 G PO CAPS: 1.0000 | ORAL_CAPSULE | Freq: Two times a day (BID) | ORAL | 11 refills | Status: DC

## 2019-02-10 NOTE — Patient Instructions (Signed)
Preventive Care 80 Years and Older, Female Preventive care refers to lifestyle choices and visits with your health care provider that can promote health and wellness. This includes:  A yearly physical exam. This is also called an annual well check.  Regular dental and eye exams.  Immunizations.  Screening for certain conditions.  Healthy lifestyle choices, such as diet and exercise. What can I expect for my preventive care visit? Physical exam Your health care provider will check:  Height and weight. These may be used to calculate body mass index (BMI), which is a measurement that tells if you are at a healthy weight.  Heart rate and blood pressure.  Your skin for abnormal spots. Counseling Your health care provider may ask you questions about:  Alcohol, tobacco, and drug use.  Emotional well-being.  Home and relationship well-being.  Sexual activity.  Eating habits.  History of falls.  Memory and ability to understand (cognition).  Work and work Statistician.  Pregnancy and menstrual history. What immunizations do I need?  Influenza (flu) vaccine  This is recommended every year. Tetanus, diphtheria, and pertussis (Tdap) vaccine  You may need a Td booster every 10 years. Varicella (chickenpox) vaccine  You may need this vaccine if you have not already been vaccinated. Zoster (shingles) vaccine  You may need this after age 33. Pneumococcal conjugate (PCV13) vaccine  One dose is recommended after age 33. Pneumococcal polysaccharide (PPSV23) vaccine  One dose is recommended after age 72. Measles, mumps, and rubella (MMR) vaccine  You may need at least one dose of MMR if you were born in 1957 or later. You may also need a second dose. Meningococcal conjugate (MenACWY) vaccine  You may need this if you have certain conditions. Hepatitis A vaccine  You may need this if you have certain conditions or if you travel or work in places where you may be exposed  to hepatitis A. Hepatitis B vaccine  You may need this if you have certain conditions or if you travel or work in places where you may be exposed to hepatitis B. Haemophilus influenzae type b (Hib) vaccine  You may need this if you have certain conditions. You may receive vaccines as individual doses or as more than one vaccine together in one shot (combination vaccines). Talk with your health care provider about the risks and benefits of combination vaccines. What tests do I need? Blood tests  Lipid and cholesterol levels. These may be checked every 5 years, or more frequently depending on your overall health.  Hepatitis C test.  Hepatitis B test. Screening  Lung cancer screening. You may have this screening every year starting at age 39 if you have a 30-pack-year history of smoking and currently smoke or have quit within the past 15 years.  Colorectal cancer screening. All adults should have this screening starting at age 36 and continuing until age 15. Your health care provider may recommend screening at age 23 if you are at increased risk. You will have tests every 1-10 years, depending on your results and the type of screening test.  Diabetes screening. This is done by checking your blood sugar (glucose) after you have not eaten for a while (fasting). You may have this done every 1-3 years.  Mammogram. This may be done every 1-2 years. Talk with your health care provider about how often you should have regular mammograms.  BRCA-related cancer screening. This may be done if you have a family history of breast, ovarian, tubal, or peritoneal cancers.  Other tests  Sexually transmitted disease (STD) testing.  Bone density scan. This is done to screen for osteoporosis. You may have this done starting at age 55. Follow these instructions at home: Eating and drinking  Eat a diet that includes fresh fruits and vegetables, whole grains, lean protein, and low-fat dairy products. Limit  your intake of foods with high amounts of sugar, saturated fats, and salt.  Take vitamin and mineral supplements as recommended by your health care provider.  Do not drink alcohol if your health care provider tells you not to drink.  If you drink alcohol: ? Limit how much you have to 0-1 drink a day. ? Be aware of how much alcohol is in your drink. In the [place] one drink equals one 12 oz bottle of beer (355 mL), one 5 oz glass of wine (148 mL), or one 1 oz glass of hard liquor (44 mL). Lifestyle  Take daily care of your teeth and gums.  Stay active. Exercise for at least 30 minutes on 5 or more days each week.  Do not use any products that contain nicotine or tobacco, such as cigarettes, e-cigarettes, and chewing tobacco. If you need help quitting, ask your health care provider.  If you are sexually active, practice safe sex. Use a condom or other form of protection in order to prevent STIs (sexually transmitted infections).  Talk with your health care provider about taking a low-dose aspirin or statin. What's next?  Go to your health care provider once a year for a well check visit.  Ask your health care provider how often you should have your eyes and teeth checked.  Stay up to date on all vaccines. This information is not intended to replace advice given to you by your health care provider. Make sure you discuss any questions you have with your health care provider. Document Released: 07/01/2015 Document Revised: 05/29/2018 Document Reviewed: 05/29/2018 Elsevier Patient Education  2020 [employer].

## 2019-02-10 NOTE — Assessment & Plan Note (Signed)
A1C is normal today

## 2019-02-10 NOTE — Assessment & Plan Note (Signed)
She desires to stay on her Amitriptyline.

## 2019-02-10 NOTE — Assessment & Plan Note (Signed)
Stable, continue Synthroid. 

## 2019-02-10 NOTE — Assessment & Plan Note (Signed)
Not complete resolution from spondylolisthesis with correction. PT as needed

## 2019-02-10 NOTE — Assessment & Plan Note (Signed)
Continue Gabapentin

## 2019-02-10 NOTE — Progress Notes (Signed)
   Subjective:    Patient ID: Yvonne [name] is a 78 y.o. female presenting with Hypertension and Medication Refill  on 02/10/2019  HPI: Has a girl coming in for 3 days/week who helps her. She is still numb in both feet. Discussed this is probably related to her neuro issues, which were treated with surgery. She is walking up to 30 mins/day with no further falls. Had appt with cards. No syncope, chest pain.  Review of Systems  Constitutional: Negative for chills and fever.  Respiratory: Negative for shortness of breath.   Cardiovascular: Negative for chest pain.  Gastrointestinal: Negative for abdominal pain, nausea and vomiting.  Genitourinary: Negative for dysuria.  Skin: Negative for rash.      Objective:    BP 114/68   Pulse 89   Wt 160 lb (72.6 kg)   SpO2 98%   BMI 30.23 kg/m  Physical Exam Constitutional:      General: She is not in acute distress.    Appearance: She is well-developed.  HENT:     Head: Normocephalic and atraumatic.  Eyes:     General: No scleral icterus. Neck:     Musculoskeletal: Neck supple.  Cardiovascular:     Rate and Rhythm: Normal rate.     Heart sounds: Murmur (loud, blowing) present.  Pulmonary:     Effort: Pulmonary effort is normal.  Abdominal:     Palpations: Abdomen is soft.  Skin:    General: Skin is warm and dry.  Neurological:     Mental Status: She is alert and oriented to person, place, and time.    A1c 6.1        Assessment & Plan:   Problem List Items Addressed This Visit      Unprioritized   Hypothyroidism    Stable, continue Synthroid      Relevant Medications   levothyroxine (SYNTHROID) 75 MCG tablet   Numbness in both legs    Not complete resolution from spondylolisthesis with correction. PT as needed      Hyperglycemia    A1C is normal today      Neuropathy    Continue Gabapentin      Relevant Medications   gabapentin (NEURONTIN) 100 MG capsule   Other Relevant Orders   Hemoglobin A1c  (Completed)   Depression, recurrent ([place])    She desires to stay on her Amitriptyline.      Relevant Medications   amitriptyline (ELAVIL) 50 MG tablet      Total face-to-face time with patient: 25 minutes. Over 50% of encounter was spent on counseling and coordination of care. Return in about 6 months (around 08/13/2019).  Donnamae Jude 02/10/2019 3:32 PM

## 2019-02-16 ENCOUNTER — Ambulatory Visit (HOSPITAL_COMMUNITY)
Admission: RE | Admit: 2019-02-16 | Discharge: 2019-02-16 | Disposition: A | Discharge status: Home / Self Care | Payer: Medicare HMO | Source: Ambulatory Visit | Attending: Cardiovascular Disease | Admitting: Cardiovascular Disease

## 2019-02-16 ENCOUNTER — Encounter: Payer: Self-pay | Admitting: Physician Assistant

## 2019-02-16 ENCOUNTER — Other Ambulatory Visit: Payer: Self-pay

## 2019-02-16 ENCOUNTER — Other Ambulatory Visit (HOSPITAL_COMMUNITY): Payer: Self-pay | Admitting: Physician Assistant

## 2019-02-16 DIAGNOSIS — I739 Peripheral vascular disease, unspecified: Secondary | ICD-10-CM | POA: Diagnosis not present

## 2019-02-16 DIAGNOSIS — I6522 Occlusion and stenosis of left carotid artery: Secondary | ICD-10-CM

## 2019-02-16 DIAGNOSIS — I779 Disorder of arteries and arterioles, unspecified: Secondary | ICD-10-CM

## 2019-02-17 ENCOUNTER — Other Ambulatory Visit: Payer: Self-pay

## 2019-02-17 MED ORDER — BENAZEPRIL HCL 40 MG PO TABS: 40.0000 mg | ORAL_TABLET | Freq: Every day | ORAL | 3 refills | Status: DC

## 2019-02-18 ENCOUNTER — Telehealth: Payer: Self-pay | Admitting: Physician Assistant

## 2019-02-18 NOTE — Telephone Encounter (Signed)
I will route to [employer]. CMA for Richardson Dopp, [place]

## 2019-02-18 NOTE — Telephone Encounter (Signed)
New Message   Patient is returning call in reference to carotid results.m (Noted from after hours message 02/17/19 5:48pm)

## 2019-02-26 DIAGNOSIS — Z961 Presence of intraocular lens: Secondary | ICD-10-CM | POA: Diagnosis not present

## 2019-02-26 DIAGNOSIS — H04123 Dry eye syndrome of bilateral lacrimal glands: Secondary | ICD-10-CM | POA: Diagnosis not present

## 2019-02-26 DIAGNOSIS — H5010 Unspecified exotropia: Secondary | ICD-10-CM | POA: Diagnosis not present

## 2019-02-26 DIAGNOSIS — Z9889 Other specified postprocedural states: Secondary | ICD-10-CM | POA: Diagnosis not present

## 2019-03-16 DIAGNOSIS — H6123 Impacted cerumen, bilateral: Secondary | ICD-10-CM | POA: Diagnosis not present

## 2019-03-20 ENCOUNTER — Other Ambulatory Visit: Payer: Self-pay | Admitting: Cardiovascular Disease

## 2019-03-20 DIAGNOSIS — Z8673 Personal history of transient ischemic attack (TIA), and cerebral infarction without residual deficits: Secondary | ICD-10-CM

## 2019-03-20 DIAGNOSIS — I10 Essential (primary) hypertension: Secondary | ICD-10-CM

## 2019-03-31 ENCOUNTER — Other Ambulatory Visit: Payer: Self-pay | Admitting: Family Medicine

## 2019-03-31 DIAGNOSIS — F339 Major depressive disorder, recurrent, unspecified: Secondary | ICD-10-CM

## 2019-04-01 ENCOUNTER — Other Ambulatory Visit: Payer: Self-pay | Admitting: Family Medicine

## 2019-04-01 DIAGNOSIS — F339 Major depressive disorder, recurrent, unspecified: Secondary | ICD-10-CM

## 2019-04-22 ENCOUNTER — Other Ambulatory Visit: Payer: Self-pay | Admitting: Family Medicine

## 2019-04-22 DIAGNOSIS — F339 Major depressive disorder, recurrent, unspecified: Secondary | ICD-10-CM

## 2019-05-06 ENCOUNTER — Other Ambulatory Visit: Payer: Self-pay | Admitting: *Deleted

## 2019-05-06 MED ORDER — OMEGA-3-ACID ETHYL ESTERS 1 G PO CAPS: 1.0000 | ORAL_CAPSULE | Freq: Two times a day (BID) | ORAL | 3 refills | Status: DC

## 2019-05-26 ENCOUNTER — Other Ambulatory Visit: Payer: Self-pay | Admitting: Family Medicine

## 2019-05-26 DIAGNOSIS — F339 Major depressive disorder, recurrent, unspecified: Secondary | ICD-10-CM

## 2019-05-28 ENCOUNTER — Other Ambulatory Visit: Payer: Self-pay | Admitting: Cardiovascular Disease

## 2019-05-28 DIAGNOSIS — Z8673 Personal history of transient ischemic attack (TIA), and cerebral infarction without residual deficits: Secondary | ICD-10-CM

## 2019-05-28 DIAGNOSIS — I10 Essential (primary) hypertension: Secondary | ICD-10-CM

## 2019-06-25 ENCOUNTER — Other Ambulatory Visit: Payer: Self-pay | Admitting: Family Medicine

## 2019-06-25 DIAGNOSIS — F339 Major depressive disorder, recurrent, unspecified: Secondary | ICD-10-CM

## 2019-07-05 ENCOUNTER — Other Ambulatory Visit: Payer: Self-pay | Admitting: Family Medicine

## 2019-07-24 ENCOUNTER — Telehealth: Payer: Self-pay | Admitting: *Deleted

## 2019-07-24 NOTE — Telephone Encounter (Signed)
LVM to call office to go over screening questions prior to visit on Monday. Aluna Whiston Zimmerman Rumple, CMA   

## 2019-07-27 ENCOUNTER — Encounter: Payer: Self-pay | Admitting: Family Medicine

## 2019-07-27 ENCOUNTER — Ambulatory Visit (INDEPENDENT_AMBULATORY_CARE_PROVIDER_SITE_OTHER): Payer: Medicare HMO | Admitting: Family Medicine

## 2019-07-27 ENCOUNTER — Other Ambulatory Visit: Payer: Self-pay

## 2019-07-27 VITALS — BP 120/68 | HR 94 | Wt 168.8 lb

## 2019-07-27 DIAGNOSIS — Z114 Encounter for screening for human immunodeficiency virus [HIV]: Secondary | ICD-10-CM

## 2019-07-27 DIAGNOSIS — G3184 Mild cognitive impairment, so stated: Secondary | ICD-10-CM | POA: Diagnosis not present

## 2019-07-27 DIAGNOSIS — Z86011 Personal history of benign neoplasm of the brain: Secondary | ICD-10-CM

## 2019-07-27 DIAGNOSIS — I10 Essential (primary) hypertension: Secondary | ICD-10-CM

## 2019-07-27 DIAGNOSIS — Z1159 Encounter for screening for other viral diseases: Secondary | ICD-10-CM

## 2019-07-27 DIAGNOSIS — G629 Polyneuropathy, unspecified: Secondary | ICD-10-CM

## 2019-07-27 DIAGNOSIS — E038 Other specified hypothyroidism: Secondary | ICD-10-CM

## 2019-07-27 DIAGNOSIS — G4700 Insomnia, unspecified: Secondary | ICD-10-CM

## 2019-07-27 DIAGNOSIS — Z8673 Personal history of transient ischemic attack (TIA), and cerebral infarction without residual deficits: Secondary | ICD-10-CM

## 2019-07-27 DIAGNOSIS — Z79890 Hormone replacement therapy (postmenopausal): Secondary | ICD-10-CM

## 2019-07-27 DIAGNOSIS — Z79899 Other long term (current) drug therapy: Secondary | ICD-10-CM

## 2019-07-27 DIAGNOSIS — I062 Rheumatic aortic stenosis with insufficiency: Secondary | ICD-10-CM

## 2019-07-27 DIAGNOSIS — F339 Major depressive disorder, recurrent, unspecified: Secondary | ICD-10-CM

## 2019-07-27 DIAGNOSIS — I350 Nonrheumatic aortic (valve) stenosis: Secondary | ICD-10-CM

## 2019-07-27 DIAGNOSIS — R413 Other amnesia: Secondary | ICD-10-CM

## 2019-07-27 MED ORDER — ASPIRIN 81 MG PO CHEW: 81.0000 mg | CHEWABLE_TABLET | Freq: Every day | ORAL | 3 refills | Status: DC

## 2019-07-27 MED ORDER — AMITRIPTYLINE HCL 50 MG PO TABS: 150.0000 mg | ORAL_TABLET | Freq: Every day | ORAL | 0 refills | Status: DC

## 2019-07-27 MED ORDER — HYDROCHLOROTHIAZIDE 25 MG PO TABS: 25.0000 mg | ORAL_TABLET | Freq: Every day | ORAL | 3 refills | Status: DC

## 2019-07-27 MED ORDER — GABAPENTIN 100 MG PO CAPS: 100.0000 mg | ORAL_CAPSULE | Freq: Three times a day (TID) | ORAL | 3 refills | Status: DC

## 2019-07-27 MED ORDER — LEVOTHYROXINE SODIUM 75 MCG PO TABS: 75.0000 ug | ORAL_TABLET | Freq: Every day | ORAL | 3 refills | Status: DC

## 2019-07-27 MED ORDER — AMLODIPINE BESYLATE 5 MG PO TABS: 5.0000 mg | ORAL_TABLET | Freq: Every day | ORAL | 3 refills | Status: DC

## 2019-07-27 MED ORDER — BENAZEPRIL HCL 40 MG PO TABS: 40.0000 mg | ORAL_TABLET | Freq: Every day | ORAL | 3 refills | Status: DC

## 2019-07-27 NOTE — Progress Notes (Signed)
Subjective  Yvonne [name] is a 79 y.o. female is presenting with the following: Refills of medications.  Memory changes The patient reports that she sometimes worries about her memory.  She has a history of a brain tumor and is status post surgery many years ago.  She also reports a history of a cerebrovascular accident in 61s.  She has resulting encephalomalacia.  She lives alone.  She continues to drive.  She manages her own finances.  She has 2 sons that do help around her house on.  She denies recent falls.  She mostly cooks in the microwave.  She just does not like to bake and cook anymore.  She is able to bathe, dress and toilet herself.  She does have some issues with sleep and mood disorder as described below.  Insomnia The patient reports a longstanding history of mood disorder.  She is currently on amitriptyline 150 mg nightly.  She denies side effects such as dry mouth or urinary issues.  She denies recent falls.  She would like to continue this medication and has tried a number of other medications in the past.  Hypertension The patient denies chest pain, dyspnea or fatigue.  She does not endorse occasional shortness of breath when she gets very active.  She denies syncope or lightheadedness.  She is currently on an ACE and ARB as well as a thiazide diuretic and calcium channel blocker.  She definitely sure how the ARB got started but she is currently taking benazepril and olmesartan.  She denies a dry cough.  Thyroid disease The patient reports no signs or symptoms of thyroid excess or deficiency.  She is taking 75 mcg of levothyroxine currently.  Medical history notable for history of stroke in 15s, hypertension, mild aortic stenosis on her last echocardiogram, hypertension, hypothyroidism.  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 Objective Vital Signs reviewed BP 120/68   Pulse 94   Wt 168 lb 12.8 oz (76.6 kg)   SpO2 96%   BMI 31.89 kg/m  HEENT:   Bilateral eyes with acute placement of longer.  Right eye deviates to right side, noted previously.  Mild conjunctival injection of both eyes. Dentition is moderate Speech is clear Cardiac: Regular rate and rhythm.  Systolic murmur 3 out of 6 appreciated throughout the precordium, radiates to carotids loudest at the right upper sternal border. Mid-peaking.  Lungs: Clear bilaterally to ascultation.  Abdomen: Normoactive bowel sounds. No tenderness to deep or light palpation. No rebound or guarding.  Extremities: Warm, well perfused without edema.  She is alert oriented and appropriate.  She does retell similar stories during her visit today.  Assessments/Plans  Mild cognitive impairment with memory loss Independent for ADLs. Has two children that are supportive. No recent falls. TSH today. Consider Geriatrics clinic in future.   Hypothyroidism Repeat TSH today.   Essential hypertension Patient taking and prescribed ACEi and ARB currently. Called pharmacy, stopped ARB. Continue ACE, HCTZ, Amlodipine.  BMP today.   INSOMNIA, PERSISTENT Currently taking 150 milligrams of amitriptyline.  We discussed that considerable side effect and risk profile of this medication given her age.  We discussed the risk of falls and cognitive impairment.  The patient is certain she would not fall asleep without this medication.  She reports she would rather have memory impairment before sleep.  We will continue to try to address.    See after visit summary for details of patient instructions  Dorris Singh, MD  Family Medicine Teaching Service

## 2019-07-27 NOTE — Assessment & Plan Note (Signed)
Independent for ADLs. Has two children that are supportive. No recent falls. TSH today. Consider Geriatrics clinic in future.

## 2019-07-27 NOTE — Patient Instructions (Signed)
When registration opens, appointments can also be scheduled by phone at [phone] (Option 2) from 8:00 am-5:00 pm, until all appointments have been filled.  It was wonderful to see you today.  Thank you for choosing [facility].   Please call [phone] with any questions about today's appointment.  Please be sure to schedule follow up at the front  desk before you leave today.   Dorris Singh, MD  Family Medicine    Please follow up in 1 month for a memory check

## 2019-07-27 NOTE — Assessment & Plan Note (Signed)
Patient taking and prescribed ACEi and ARB currently. Called pharmacy, stopped ARB. Continue ACE, HCTZ, Amlodipine.  BMP today.

## 2019-07-27 NOTE — Assessment & Plan Note (Signed)
Currently taking 150 milligrams of amitriptyline.  We discussed that considerable side effect and risk profile of this medication given her age.  We discussed the risk of falls and cognitive impairment.  The patient is certain she would not fall asleep without this medication.  She reports she would rather have memory impairment before sleep.  We will continue to try to address.

## 2019-07-27 NOTE — Assessment & Plan Note (Signed)
Repeat TSH today

## 2019-07-28 ENCOUNTER — Telehealth: Payer: Self-pay | Admitting: Family Medicine

## 2019-07-28 ENCOUNTER — Encounter: Payer: Self-pay | Admitting: Family Medicine

## 2019-07-28 LAB — HEPATITIS C ANTIBODY (REFLEX): HCV Ab: <0.1 s/co ratio (ref 0.0–0.9)

## 2019-07-28 LAB — LIPID PANEL
Chol/HDL Ratio: 3 ratio (ref 0.0–4.4)
Cholesterol, Total: 164 mg/dL (ref 100–199)
HDL: 54 mg/dL (ref >39)
LDL Chol Calc (NIH): 71 mg/dL (ref 0–99)
Triglycerides: 238 mg/dL — ABNORMAL HIGH (ref 0–149)
VLDL Cholesterol Cal: 39 mg/dL (ref 5–40)

## 2019-07-28 LAB — HEPATIC FUNCTION PANEL
ALT: 33 IU/L — ABNORMAL HIGH (ref 0–32)
AST: 31 IU/L (ref 0–40)
Albumin: 4.3 g/dL (ref 3.7–4.7)
Alkaline Phosphatase: 95 IU/L (ref 39–117)
Bilirubin Total: <0.2 mg/dL (ref 0.0–1.2)
Bilirubin, Direct: 0.09 mg/dL (ref 0.00–0.40)
Total Protein: 7.1 g/dL (ref 6.0–8.5)

## 2019-07-28 LAB — BASIC METABOLIC PANEL
BUN/Creatinine Ratio: 25 (ref 12–28)
BUN: 20 mg/dL (ref 8–27)
CO2: 23 mmol/L (ref 20–29)
Calcium: 9.9 mg/dL (ref 8.7–10.3)
Chloride: 100 mmol/L (ref 96–106)
Creatinine, Ser: 0.79 mg/dL (ref 0.57–1.00)
GFR calc Af Amer: 82 mL/min/{1.73_m2} (ref >59)
GFR calc non Af Amer: 71 mL/min/{1.73_m2} (ref >59)
Glucose: 196 mg/dL — ABNORMAL HIGH (ref 65–99)
Potassium: 4.1 mmol/L (ref 3.5–5.2)
Sodium: 138 mmol/L (ref 134–144)

## 2019-07-28 LAB — HCV COMMENT:

## 2019-07-28 LAB — TSH: TSH: 3.94 u[IU]/mL (ref 0.450–4.500)

## 2019-07-28 NOTE — Telephone Encounter (Signed)
Attempted to call patient regarding results.  If she calls back please let her know:  - Thyroid is normal - Cholesterol looks okay--we will monitor, no changes - Liver function looks perfect - Glucose is elevated on kidney function, she ate before appointment, will monitor. No changes at this time.   Overall, look excellent. Will monitor blood glucose  Thank you, Dorris Singh, MD  [facility] Teaching Service

## 2019-07-31 ENCOUNTER — Other Ambulatory Visit: Payer: Self-pay

## 2019-07-31 ENCOUNTER — Other Ambulatory Visit: Payer: Medicare HMO | Admitting: *Deleted

## 2019-07-31 ENCOUNTER — Encounter (HOSPITAL_COMMUNITY): Payer: Medicare HMO

## 2019-07-31 DIAGNOSIS — E785 Hyperlipidemia, unspecified: Secondary | ICD-10-CM

## 2019-07-31 LAB — LIPID PANEL
Chol/HDL Ratio: 2.2 ratio (ref 0.0–4.4)
Cholesterol, Total: 125 mg/dL (ref 100–199)
HDL: 56 mg/dL (ref >39)
LDL Chol Calc (NIH): 44 mg/dL (ref 0–99)
Triglycerides: 148 mg/dL (ref 0–149)
VLDL Cholesterol Cal: 25 mg/dL (ref 5–40)

## 2019-07-31 LAB — COMPREHENSIVE METABOLIC PANEL
ALT: 31 IU/L (ref 0–32)
AST: 23 IU/L (ref 0–40)
Albumin/Globulin Ratio: 1.6 (ref 1.2–2.2)
Albumin: 4.2 g/dL (ref 3.7–4.7)
Alkaline Phosphatase: 93 IU/L (ref 39–117)
BUN/Creatinine Ratio: 19 (ref 12–28)
BUN: 17 mg/dL (ref 8–27)
Bilirubin Total: 0.2 mg/dL (ref 0.0–1.2)
CO2: 26 mmol/L (ref 20–29)
Calcium: 9.3 mg/dL (ref 8.7–10.3)
Chloride: 99 mmol/L (ref 96–106)
Creatinine, Ser: 0.88 mg/dL (ref 0.57–1.00)
GFR calc Af Amer: 72 mL/min/{1.73_m2} (ref >59)
GFR calc non Af Amer: 62 mL/min/{1.73_m2} (ref >59)
Globulin, Total: 2.7 g/dL (ref 1.5–4.5)
Glucose: 120 mg/dL — ABNORMAL HIGH (ref 65–99)
Potassium: 3.8 mmol/L (ref 3.5–5.2)
Sodium: 138 mmol/L (ref 134–144)
Total Protein: 6.9 g/dL (ref 6.0–8.5)

## 2019-08-03 ENCOUNTER — Other Ambulatory Visit: Payer: Self-pay

## 2019-08-03 ENCOUNTER — Ambulatory Visit (HOSPITAL_COMMUNITY)
Admission: RE | Admit: 2019-08-03 | Discharge: 2019-08-03 | Disposition: A | Discharge status: Home / Self Care | Payer: Medicare HMO | Source: Ambulatory Visit | Attending: Family Medicine | Admitting: Family Medicine

## 2019-08-03 DIAGNOSIS — I062 Rheumatic aortic stenosis with insufficiency: Secondary | ICD-10-CM | POA: Diagnosis not present

## 2019-08-03 DIAGNOSIS — I10 Essential (primary) hypertension: Secondary | ICD-10-CM | POA: Diagnosis not present

## 2019-08-03 NOTE — Progress Notes (Signed)
  Echocardiogram 2D Echocardiogram has been performed.  Yvonne [name] 08/03/2019, 4:02 PM

## 2019-08-05 ENCOUNTER — Telehealth: Payer: Self-pay | Admitting: Cardiovascular Disease

## 2019-08-05 NOTE — Telephone Encounter (Signed)
Jonni Sanger, patient's home health aid, is returning call to discuss lab results from labs completed on 07/31/19. Jonni Sanger states that the patient understands her lab results and she prefers not to receive another call in regards to her lab work.

## 2019-08-05 NOTE — Telephone Encounter (Signed)
I accidentally called the pt in error. See note from [place].

## 2019-08-06 ENCOUNTER — Telehealth: Payer: Self-pay | Admitting: Family Medicine

## 2019-08-06 NOTE — Telephone Encounter (Signed)
Called patient regarding elevated BG, she had 4 donuts prior to lab draw. Will obtain A1C at follow up.   Discussed results of labs, echocardiogram. Repeat echocardiogram in ~ 6 months pending Cardiology recommendations. She is aware of appointment on 2/19.  During conversation, phone call ended. Attempted to call back.  Nursing---please call patient and schedule follow up with me in 1 month. Please tell her to bring ALL of her medications with her (again).   Thanks, Dorris Singh, MD  [facility] Medicine Teaching Service

## 2019-08-07 ENCOUNTER — Ambulatory Visit: Payer: Medicare HMO | Admitting: Physician Assistant

## 2019-08-19 NOTE — Progress Notes (Signed)
Virtual Visit via Telephone Note   This visit type was conducted due to national recommendations for restrictions regarding the COVID-19 Pandemic (e.g. social distancing) in an effort to limit this patient's exposure and mitigate transmission in our community.  Due to her co-morbid illnesses, this patient is at least at moderate risk for complications without adequate follow up.  This format is felt to be most appropriate for this patient at this time.  The patient did not have access to video technology/had technical difficulties with video requiring transitioning to audio format only (telephone).  All issues noted in this document were discussed and addressed.  No physical exam could be performed with this format.  Please refer to the patient's chart for her  consent to telehealth for [facility].   Date:  08/19/2019   ID:  Yvonne [name], DOB 1940/10/28, MRN [id number]  Patient Location: Home Provider Location: Home  PCP:  Martyn Malay, MD  Cardiologist:  Mertie Moores, MD  Electrophysiologist:  None   Evaluation Performed:  Follow-Up Visit  Chief Complaint: Follow-up, 6 months  History of Present Illness:    Yvonne [name] is a 79 y.o. female with a history of aortic valve disease, hypertension, history of CVA, occluded left carotid artery and hypothyroidism here today for 72-month follow-up, seen for Dr. Acie Fredrickson.  Yvonne [name] was most recently seen by Richardson Dopp, PA 02/02/2019 in follow-up and was doing well at that time. She had recently undergone hip surgery but was recovering without issues.  Noted to no longer be taking benazepril for unclear reasons and her PCP changed her to Benicar however she was not taking this.  Plan was for BP cuff and monitor at home and adjust therapy based on these readings.  Unfortunately, it appears patient underwent an echocardiogram 08/03/2019 which showed worsening aortic stenosis with a mean gradient of 30.2 mmHg with normal LV function and G1  DD. Previous echo 08/15/2011 with mild aortic stenosis with mean gradient at 45mmHg.   Today she states that she has been doing well from a CV standpoint.  She reports a few falls however these were in the setting of tripping and were also reported as last year or even the year before.  She denies dizziness or syncope.  Reports BPs have been in the 130 mmHg range however does not have a cuff available at this time.  Recently changed PCPs and underwent lab work which all looks great.  LDL is well controlled.  Creatinine is stable.  Discussed monitoring symptoms of AS such as shortness of breath, dizziness, syncope.  We will plan for follow-up echocardiogram in 6 months for reassessment.  Overall is doing well.  The patient does not have symptoms concerning for COVID-19 infection (fever, chills, cough, or new shortness of breath).   Past Medical History:  Diagnosis Date  . Anemia   . Anxiety   . Aortic stenosis    mild by 07/2011 echo   . Arthritis   . Bilateral carotid artery disease ([place]) 02/04/2017   Carotid US 01/2019:R 1-39;L Total Occlusion; Repeat 1 Year  . Depression, recurrent ([place]) 02/10/2019  . Hypertension   . Hypothyroidism   . Impaired functional mobility, balance, gait, and endurance 09/02/2015  . Mild cognitive impairment with memory loss 09/02/2015  . Stroke [facility])    pt was 57   Past Surgical History:  Procedure Laterality Date  . ABDOMINAL HYSTERECTOMY    . BRAIN SURGERY     age 1's  .  JOINT REPLACEMENT    . NO PAST SURGERIES    . TOE AMPUTATION  2013   2nd toe on right foot, hammer toe  . TONSILLECTOMY    . TOTAL HIP ARTHROPLASTY Left 03/19/2017   Procedure: LEFT TOTAL HIP ARTHROPLASTY ANTERIOR APPROACH;  Surgeon: Mcarthur Rossetti, MD;  Location: [facility];  Service: Orthopedics;  Laterality: Left;     No outpatient medications have been marked as taking for the 08/21/19 encounter (Appointment) with Tommie Raymond, NP.     Allergies:   Patient has no known  allergies.   Social History   Tobacco Use  . Smoking status: Never Smoker  . Smokeless tobacco: Never Used  Substance Use Topics  . Alcohol use: Yes    Alcohol/week: 1.0 standard drinks    Types: 1 Glasses of wine per week  . Drug use: No     Family Hx: The patient's family history includes Heart disease in her father; Thyroid disease in her mother.  ROS:   Please see the history of present illness.     All other systems reviewed and are negative.  Prior CV studies:   The following studies were reviewed today:  Echocardiogram 08/03/2019:  FINDINGS  Left Ventricle: Left ventricular ejection fraction, by estimation, is 60  to 65%. The left ventricle has normal function. The left ventricle has no  regional wall motion abnormalities. The left ventricular internal cavity  size was normal in size. There is  mild left ventricular hypertrophy. Left ventricular diastolic parameters  are consistent with Grade I diastolic dysfunction (impaired relaxation).  Elevated left ventricular end-diastolic pressure.   Right Ventricle: The right ventricular size is normal. No increase in  right ventricular wall thickness. Right ventricular systolic function is  normal.   Left Atrium: Left atrial size was moderately dilated.   Right Atrium: Right atrial size was normal in size.   Pericardium: There is no evidence of pericardial effusion.   Mitral Valve: The mitral valve is normal in structure and function. There  is mild thickening of the mitral valve leaflet(s). There is mild  calcification of the mitral valve leaflet(s). Normal mobility of the  mitral valve leaflets. Trivial mitral valve  regurgitation. No evidence of mitral valve stenosis.   Tricuspid Valve: The tricuspid valve is normal in structure. Tricuspid  valve regurgitation is mild . No evidence of tricuspid stenosis.   Aortic Valve: The aortic valve is tricuspid. Aortic valve regurgitation is  mild. Moderate to severe  aortic stenosis is present. Aortic valve mean  gradient measures 30.2 mmHg. Aortic valve peak gradient measures 53.5  mmHg. Aortic valve area, by VTI  measures 1.00 cm.   Pulmonic Valve: The pulmonic valve was normal in structure. Pulmonic valve  regurgitation is not visualized. No evidence of pulmonic stenosis.   Aorta: The aortic root is normal in size and structure.   Venous: The inferior vena cava is normal in size with greater than 50%  respiratory variability, suggesting right atrial pressure of 3 mmHg.   IAS/Shunts: No atrial level shunt detected by color flow Doppler.   Labs/Other Tests and Data Reviewed:    EKG:    Recent Labs: 07/27/2019: TSH 3.940 07/31/2019: ALT 31; BUN 17; Creatinine, Ser 0.88; Potassium 3.8; Sodium 138   Recent Lipid Panel Lab Results  Component Value Date/Time   CHOL 125 07/31/2019 07:56 AM   TRIG 148 07/31/2019 07:56 AM   HDL 56 07/31/2019 07:56 AM   CHOLHDL 2.2 07/31/2019 07:56 AM  CHOLHDL 3.1 07/25/2016 11:55 AM   LDLCALC 44 07/31/2019 07:56 AM   LDLDIRECT 89.0 12/08/2014 10:19 AM    Wt Readings from Last 3 Encounters:  07/27/19 168 lb 12.8 oz (76.6 kg)  02/10/19 160 lb (72.6 kg)  02/02/19 150 lb (68 kg)     Objective:    Vital Signs:  There were no vitals taken for this visit.   VITAL SIGNS:  reviewed GEN:  no acute distress NEURO:  alert and oriented x 3, no obvious focal deficit PSYCH:  normal affect  ASSESSMENT & PLAN:    1.  Essential hypertension: -Preop recently noted to have been taken off benazepril and transition to Benicar however she was no longer taking -BPs -Continue amlodipine 5, benazepril 40, hydrochlorothiazide 25  2.  Bilateral carotid artery disease: -Patient with a history of prior stroke and chronically occluded LICA with mild stenosis in RICA at 1-39%, no significant change from prior Doppler studies -Repeat carotid Dopplers performed -Continue ASA, statin   3.  Hyperlipidemia: -Last LDL, 44 from  07/31/19 -Continue statin  4.  Aortic valve stenosis/insufficiency, rheumatic: -Mild per echocardiogram in 2016 with no symptoms suggestive of significant worsening -Unfortunately, it appears patient underwent an echocardiogram 08/03/2019 which showed worsening aortic stenosis with a mean gradient of 30.2 mmHg with normal LV function and G1 DD. -No symptoms of dizziness or syncope -Repeat echo in 6 months or sooner if symptoms   COVID-19 Education: The signs and symptoms of COVID-19 were discussed with the patient and how to seek care for testing (follow up with PCP or arrange E-visit).  The importance of social distancing was discussed today.  Time:   Today, I have spent 15 minutes with the patient with telehealth technology discussing the above problems.     Medication Adjustments/Labs and Tests Ordered: Current medicines are reviewed at length with the patient today.  Concerns regarding medicines are outlined above.   Tests Ordered: No orders of the defined types were placed in this encounter.   Medication Changes: No orders of the defined types were placed in this encounter.   Follow Up:  In Person Dr. Nausea or APP in 6 months  Signed, Kathyrn Drown, NP  08/19/2019 12:47 PM    Yvonne [name]

## 2019-08-20 ENCOUNTER — Other Ambulatory Visit: Payer: Self-pay | Admitting: *Deleted

## 2019-08-20 DIAGNOSIS — F339 Major depressive disorder, recurrent, unspecified: Secondary | ICD-10-CM

## 2019-08-20 MED ORDER — AMITRIPTYLINE HCL 50 MG PO TABS: 150.0000 mg | ORAL_TABLET | Freq: Every day | ORAL | 0 refills | Status: DC

## 2019-08-21 ENCOUNTER — Other Ambulatory Visit: Payer: Self-pay

## 2019-08-21 ENCOUNTER — Telehealth (INDEPENDENT_AMBULATORY_CARE_PROVIDER_SITE_OTHER): Payer: Medicare HMO | Admitting: Cardiology

## 2019-08-21 ENCOUNTER — Telehealth: Payer: Medicare HMO | Admitting: Physician Assistant

## 2019-08-21 ENCOUNTER — Encounter: Payer: Self-pay | Admitting: Cardiology

## 2019-08-21 VITALS — Ht 61.0 in | Wt 165.0 lb

## 2019-08-21 DIAGNOSIS — I10 Essential (primary) hypertension: Secondary | ICD-10-CM

## 2019-08-21 DIAGNOSIS — I779 Disorder of arteries and arterioles, unspecified: Secondary | ICD-10-CM

## 2019-08-21 DIAGNOSIS — E785 Hyperlipidemia, unspecified: Secondary | ICD-10-CM

## 2019-08-21 DIAGNOSIS — I062 Rheumatic aortic stenosis with insufficiency: Secondary | ICD-10-CM

## 2019-08-21 NOTE — Patient Instructions (Signed)
Medication Instructions:   Your physician recommends that you continue on your current medications as directed. Please refer to the Current Medication list given to you today.  *If you need a refill on your cardiac medications before your next appointment, please call your pharmacy*  Lab Work:  None ordered today  If you have labs (blood work) drawn today and your tests are completely normal, you will receive your results only by: Marland Kitchen MyChart Message (if you have MyChart) OR . A paper copy in the mail If you have any lab test that is abnormal or we need to change your treatment, we will call you to review the results.  Testing/Procedures:  Your physician has requested that you have an echocardiogram in 6 months. Echocardiography is a painless test that uses sound waves to create images of your heart. It provides your doctor with information about the size and shape of your heart and how well your heart's chambers and valves are working. This procedure takes approximately one hour. There are no restrictions for this procedure.  Follow-Up: At [facility], you and your health needs are our priority.  As part of our continuing mission to provide you with exceptional heart care, we have created designated Provider Care Teams.  These Care Teams include your primary Cardiologist (physician) and Advanced Practice Providers (APPs -  Physician Assistants and Nurse Practitioners) who all work together to provide you with the care you need, when you need it.  We recommend signing up for the patient portal called "MyChart".  Sign up information is provided on this After Visit Summary.  MyChart is used to connect with patients for Virtual Visits (Telemedicine).  Patients are able to view lab/test results, encounter notes, upcoming appointments, etc.  Non-urgent messages can be sent to your provider as well.   To learn more about what you can do with MyChart, go to [email or web address].    Your next  appointment:   6 month(s)  The format for your next appointment:   In Person  Provider:   You may see Mertie Moores, MD or one of the following Advanced Practice Providers on your designated Care Team:    Richardson Dopp, PA-C  [place], [place]  Daune Perch, [place]

## 2019-08-28 ENCOUNTER — Encounter (HOSPITAL_COMMUNITY): Payer: Self-pay | Admitting: Cardiology

## 2019-08-30 ENCOUNTER — Encounter: Payer: Self-pay | Admitting: Family Medicine

## 2019-08-31 ENCOUNTER — Other Ambulatory Visit: Payer: Self-pay

## 2019-08-31 ENCOUNTER — Encounter: Payer: Self-pay | Admitting: Family Medicine

## 2019-08-31 ENCOUNTER — Ambulatory Visit (INDEPENDENT_AMBULATORY_CARE_PROVIDER_SITE_OTHER): Payer: Medicare HMO | Admitting: Family Medicine

## 2019-08-31 DIAGNOSIS — F339 Major depressive disorder, recurrent, unspecified: Secondary | ICD-10-CM

## 2019-08-31 DIAGNOSIS — R413 Other amnesia: Secondary | ICD-10-CM

## 2019-08-31 DIAGNOSIS — I350 Nonrheumatic aortic (valve) stenosis: Secondary | ICD-10-CM

## 2019-08-31 DIAGNOSIS — M8580 Other specified disorders of bone density and structure, unspecified site: Secondary | ICD-10-CM

## 2019-08-31 NOTE — Assessment & Plan Note (Signed)
Patient has been on amitriptyline 150 mg for quite some time---we discussed again today. Discussed side effects and risk. No falls. Does have dry mouth. Will continue to address.

## 2019-08-31 NOTE — Assessment & Plan Note (Signed)
Mild. Still quite independent. Drives--has not had accident or gotten lost Cooks for herself Does have aid (named Jonni Sanger) who helps with cleaning and doing bills 3/3 on recall today, knows time, date, place, why she is here.  Hearing sometimes limits answers to questions.

## 2019-08-31 NOTE — Assessment & Plan Note (Signed)
Follow up in 6 months. Denies symptoms today. No falls or syncope. No chest pain.

## 2019-08-31 NOTE — Patient Instructions (Addendum)
Bring in all of your medications  I would like to see you in June  You are doing great  It was wonderful to see you today.  Thank you for choosing [facility].   Please call [phone] with any questions about today's appointment.  Please be sure to schedule follow up at the front  desk before you leave today.   Dorris Singh, MD  Family Medicine   COVID-19 [place] [phone]. It's a free call.

## 2019-08-31 NOTE — Assessment & Plan Note (Signed)
DEXA in 2016--- mild osteopenia--repeat 10-15 years.   For Mild Osteopenia with T score -1.01 to -1.49: Every 10-15 years For Moderate Osteopenia with T score of -1.50 to -1.99: Every 3-5 years For Severe Osteopenia with T score -2.00 to -2.49: One year follow up   [place] NEJM 2013

## 2019-08-31 NOTE — Progress Notes (Signed)
Subjective  Yvonne [name] is a 79 y.o. individual  is presenting with the following: medication check   Patient is alone today.  She presents today to review her medications and discuss concerns.  She reports overall she is doing well.  She lives alone but has a friend who helps her with groceries.  She also has an aide that comes several hours a week and helps with finances as well as cleaning her house.  This is been through social services.  She continues to drive.  She is able to cook, dress, clean, bathe and feed herself.  She reports she has some issues with her memory but feels she is doing well.  In terms of the patient's aortic stenosis she denies chest pain dizziness or syncope.  She is aware she has follow-up with cardiology in 6 months.  We discussed amitriptyline today.  The patient would like to continue this therapy at this time.  She finds benefit in terms of her sleep.  She denies falls but does endorse a dry mouth.   Objective Vital Signs reviewed BP 128/72   Pulse 72   Ht 5\' 1"  (1.549 m)   Wt 169 lb 6.4 oz (76.8 kg)   SpO2 98%   BMI 32.01 kg/m  HEENT: Sclera anicteric.  Eyes without injection today.  Eyeliner in place.  Right eye with some exotropia.  Bilateral hearing aids in place Cardiac: Regular rate and rhythm. Normal S1/S2.  Systolic murmur 3 out of 6 at the right upper sternal border with radiation throughout the precordium Lungs: Clear bilaterally to ascultation.   Extremities: Warm, well perfused without edema.  Skin: Warm, dry Psych: Pleasant and appropriate  Alert and oriented to person place and date.  She is able to recall 3 out of 3 words, (apple, penny,table)    Assessments/Plans  Aortic stenosis Follow up in 6 months. Denies symptoms today. No falls or syncope. No chest pain.   Memory impairment Mild. Still quite independent. Drives--has not had accident or gotten lost Cooks for herself Does have aid (named Jonni Sanger) who helps with cleaning and  doing bills 3/3 on recall today, knows time, date, place, why she is here.  Hearing sometimes limits answers to questions.   Depression, recurrent ([place]) Patient has been on amitriptyline 150 mg for quite some time---we discussed again today. Discussed side effects and risk. No falls. Does have dry mouth. Will continue to address.   Osteopenia DEXA in 2016--- mild osteopenia--repeat 10-15 years.   For Mild Osteopenia with T score -1.01 to -1.49: Every 10-15 years For Moderate Osteopenia with T score of -1.50 to -1.99: Every 3-5 years For Severe Osteopenia with T score -2.00 to -2.49: One year follow up   [employer] 2013    Medication Management- reviewed medications in office with patient today. Updated list.   See after visit summary for details of patient instructions  Dorris Singh, MD  [facility] Medicine Teaching Service

## 2019-09-07 ENCOUNTER — Ambulatory Visit: Payer: Medicare HMO | Admitting: Family Medicine

## 2019-09-09 ENCOUNTER — Other Ambulatory Visit: Payer: Self-pay | Admitting: *Deleted

## 2019-09-09 DIAGNOSIS — F339 Major depressive disorder, recurrent, unspecified: Secondary | ICD-10-CM

## 2019-09-09 MED ORDER — AMITRIPTYLINE HCL 50 MG PO TABS: 150.0000 mg | ORAL_TABLET | Freq: Every day | ORAL | 0 refills | Status: DC

## 2019-09-18 ENCOUNTER — Ambulatory Visit: Payer: Medicare HMO | Admitting: Physician Assistant

## 2019-11-04 ENCOUNTER — Other Ambulatory Visit: Payer: Self-pay | Admitting: *Deleted

## 2019-11-04 DIAGNOSIS — R7301 Impaired fasting glucose: Secondary | ICD-10-CM

## 2019-11-04 MED ORDER — TRUE METRIX METER W/DEVICE KIT: PACK | 0 refills | Status: DC

## 2019-11-04 MED ORDER — RELION TRUE METRIX TEST STRIPS VI STRP: ORAL_STRIP | 12 refills | Status: DC

## 2019-11-04 MED ORDER — LANCETS 33G MISC: 3 refills | Status: DC

## 2019-11-04 NOTE — Telephone Encounter (Signed)
Rx sent to [facility]. Please call patient to schedule an appointment with me in June/July.  Thanks, Dorris Singh, MD  [facility] Medicine Teaching Service

## 2019-11-04 NOTE — Telephone Encounter (Signed)
Pt informed and scheduled. Kikue Gerhart, CMA  

## 2019-11-04 NOTE — Telephone Encounter (Signed)
Rx request for humana true metrix air meter, humana true metrix test strip, and trueplus 33g lancets. Please advise. Deseree Kennon Holter, CMA

## 2019-11-10 ENCOUNTER — Other Ambulatory Visit: Payer: Self-pay

## 2019-11-10 MED ORDER — TRUE METRIX LEVEL 1 LOW VI SOLN: 0 refills | Status: DC

## 2019-11-10 MED ORDER — BD SWAB SINGLE USE REGULAR PADS: MEDICATED_PAD | 3 refills | Status: DC

## 2019-11-10 NOTE — Telephone Encounter (Signed)
Phone call received from [facility] regarding rx for alcohol pads and level 1 control solution. Rx pending as I was unable to find diagnosis code to associate glucometer supplies with.   To PCP for further clarification.  Talbot Grumbling, RN

## 2019-11-27 ENCOUNTER — Ambulatory Visit (INDEPENDENT_AMBULATORY_CARE_PROVIDER_SITE_OTHER): Payer: Medicare HMO | Admitting: Family Medicine

## 2019-11-27 ENCOUNTER — Encounter: Payer: Self-pay | Admitting: Family Medicine

## 2019-11-27 ENCOUNTER — Other Ambulatory Visit: Payer: Self-pay

## 2019-11-27 VITALS — BP 118/62 | HR 79 | Ht 61.0 in | Wt 171.0 lb

## 2019-11-27 DIAGNOSIS — R7309 Other abnormal glucose: Secondary | ICD-10-CM

## 2019-11-27 DIAGNOSIS — I350 Nonrheumatic aortic (valve) stenosis: Secondary | ICD-10-CM

## 2019-11-27 DIAGNOSIS — E1159 Type 2 diabetes mellitus with other circulatory complications: Secondary | ICD-10-CM

## 2019-11-27 DIAGNOSIS — H5440 Blindness, one eye, unspecified eye: Secondary | ICD-10-CM

## 2019-11-27 DIAGNOSIS — E038 Other specified hypothyroidism: Secondary | ICD-10-CM | POA: Diagnosis not present

## 2019-11-27 DIAGNOSIS — I10 Essential (primary) hypertension: Secondary | ICD-10-CM

## 2019-11-27 DIAGNOSIS — E1122 Type 2 diabetes mellitus with diabetic chronic kidney disease: Secondary | ICD-10-CM | POA: Insufficient documentation

## 2019-11-27 HISTORY — DX: Type 2 diabetes mellitus with diabetic chronic kidney disease: E11.22

## 2019-11-27 LAB — POCT GLYCOSYLATED HEMOGLOBIN (HGB A1C): HbA1c, POC (controlled diabetic range): 6.6 % (ref 0.0–7.0)

## 2019-11-27 NOTE — Assessment & Plan Note (Signed)
Patient has a follow-up September for an echocardiogram.  She denies symptoms today including chest pain, dyspnea or syncope.  She reports overall she is doing well.

## 2019-11-27 NOTE — Patient Instructions (Addendum)
It was wonderful to see you today.  Please bring ALL of your medications with you to every visit.   Thank you for choosing [facility].   Please call [phone] with any questions about today's appointment.  Please be sure to schedule follow up at the front  desk before you leave today.   Dorris Singh, MD  Family Medicine    Please call Dr. Audelia Hives is an expert in nutrition   [phone]  Today we talked about your diabetes  PLEASE RETURN IN 3 months   We discussed  1. Limiting cake to once to twice per week 2. Reducing sugary sweet beverages   Follow up with the Heart Doctor in September

## 2019-11-27 NOTE — Progress Notes (Signed)
    SUBJECTIVE:   CHIEF COMPLAINT / HPI:   Yvonne [name] is a pleasant 79 year old woman with history of aortic stenosis, depression with insomnia, hypertension and newly diagnosed type 2 diabetes presenting today for follow-up of elevated blood sugar.  The patient reports overall she is doing well.  She has had no falls recently.  She denies concerns.  She continues to drive.  No recent accidents.  She has follow-up with an eye doctor in a few weeks. She brings all medications with her today which she is taking as prescribed. Denies cost issues or side effects  At prior visit was noted on the patient's metabolic panel she had high glucose level.  Today her A1c is 6.6 she denies polyuria or polydipsia.  She reports that she thinks her diet is contributing to this.  She eats cake every single night.  She has 2 eggs and cereal for breakfast.  Typically is a small sandwich for lunch or soup.  For dinnertime she often has grilled chicken and vegetable followed by take.  She has had a 20 pound weight gain over the last year.  She reports she thinks this new diagnosis is due to her diet. She is not regularly physically active    PERTINENT  PMH / PSH/Family/Social History :  Aortic stenosis-  Obesity HTN  History of stroke  OBJECTIVE:   BP 118/62   Pulse 79   Ht 5\' 1"  (1.549 m)   Wt 171 lb (77.6 kg)   SpO2 95%   BMI 32.31 kg/m   HEENT: Sclera anicteric. Dentition is moderate. Appears well hydrated. Neck: Supple  Cardiac: Regular rate and rhythm. Systolic MBTDHR4/1 at RUSB  Lungs: Clear bilaterally to ascultation.  Abdomen: Normoactive bowel sounds. No tenderness to deep or light palpation. No rebound or guarding.  Extremities: Warm, well perfused without edema.  Skin: Warm, dry Psych: Pleasant and appropriate     ASSESSMENT/PLAN:   Hypothyroidism Not addressed due for repeat in February 2022.  Taking medication as prescribed.  Essential hypertension At goal today and doing  well.  She has had no falls at home.  Denies dizziness or side effects.  Aortic stenosis Patient has a follow-up September for an echocardiogram.  She denies symptoms today including chest pain, dyspnea or syncope.  She reports overall she is doing well.  Controlled type 2 diabetes mellitus with chronic kidney disease, without long-term current use of insulin ([place]) Discussed at length.  Discussed dietary changes.  We discussed portion control as well as eating cake with family and friends and for celebrations rather than nightly.  We discussed the long-term risks of diabetes.  Will consider Metformin therapy at a low dose in the future.  All questions were answered.  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  No falls at home.  She feels she is doing well.   Referred to Dr. Gaylord Shih care.   HCM- discuss COVID vaccine again at follow up.   Dorris Singh, [place]

## 2019-11-27 NOTE — Assessment & Plan Note (Signed)
At goal today and doing well.  She has had no falls at home.  Denies dizziness or side effects.

## 2019-11-27 NOTE — Assessment & Plan Note (Signed)
Discussed at length.  Discussed dietary changes.  We discussed portion control as well as eating cake with family and friends and for celebrations rather than nightly.  We discussed the long-term risks of diabetes.  Will consider Metformin therapy at a low dose in the future.  All questions were answered.

## 2019-11-27 NOTE — Assessment & Plan Note (Signed)
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  No falls at home.  She feels she is doing well.

## 2019-11-27 NOTE — Assessment & Plan Note (Signed)
Not addressed due for repeat in February 2022.  Taking medication as prescribed.

## 2019-12-23 ENCOUNTER — Other Ambulatory Visit: Payer: Self-pay

## 2019-12-23 DIAGNOSIS — F339 Major depressive disorder, recurrent, unspecified: Secondary | ICD-10-CM

## 2019-12-23 MED ORDER — AMITRIPTYLINE HCL 50 MG PO TABS: 150.0000 mg | ORAL_TABLET | Freq: Every day | ORAL | 1 refills | Status: DC

## 2019-12-23 MED ORDER — ASPIRIN 81 MG PO CHEW: 81.0000 mg | CHEWABLE_TABLET | Freq: Every day | ORAL | 3 refills | Status: AC

## 2019-12-23 NOTE — Telephone Encounter (Signed)
Reviewed medications, Rx refilled.

## 2020-01-04 ENCOUNTER — Ambulatory Visit (INDEPENDENT_AMBULATORY_CARE_PROVIDER_SITE_OTHER): Payer: Medicare HMO | Admitting: Family Medicine

## 2020-01-04 ENCOUNTER — Other Ambulatory Visit: Payer: Self-pay

## 2020-01-04 DIAGNOSIS — E1122 Type 2 diabetes mellitus with diabetic chronic kidney disease: Secondary | ICD-10-CM

## 2020-01-04 DIAGNOSIS — N181 Chronic kidney disease, stage 1: Secondary | ICD-10-CM

## 2020-01-04 NOTE — Progress Notes (Signed)
Medical Nutrition Therapy Appt start time: 1430 end time: 5183 (1 hour) Assessment:  Primary concerns today: Blood sugar control.  PCP Yvonne Singh, Yvonne [name]  Learning Readiness: Change in progress; has reduced sugar intake; has tried Nutrisystem about 6 mo ago in an effort to lose weight, but did not lose any.   [name] was referred by Dr. Owens Shark for MNT related to pre-DM.  [name] would like to lose weight.  A1c on 11/27/19 of 6.1 is also a concern.  She is not checking BG at home, but believes she has better glycemic control b/c of reducing sweets intake.  She lives alone, and has an aide from 11:30 to 1 PM 4 X wk, who helps her with meal preparation and household chores.  Otherwise, she does most of her own shopping and food prep.  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   Usual eating pattern: 3 meals and 1-2 snacks per day. Frequent foods and beverages: diet flavored water, diet iced tea, fruit juice, 2 c coffee w/ Swt 'n Low, creamer, plain water; chx, hamburger, sandwiches, canned or homemade soup, fruit, vegetables (peas, grn beans, butter beans, salads).    Avoided foods: none.   Usual physical activity: Just started back 2 wks ago:  Has 2 exercise machines at home: 10-20 min Nordic trainer-type machine and stationary bike 4-5 times per week.  Also walks to church on Sunday (10 min each way).  Does not feel confident walking on her own (not stable).   Sleep: Estimates she gets 7-8 hrs/night.  Trouble sleeping a couple times a week, so takes medication.    24-hr recall: (Up at 8:20 AM) B (8:30 AM)-   1 c coffee, 3 Sweet 'n Low, 2 tbsp creamer, 12 oz orange juice, 1 slc toast, 1 tsp butter Snk (10 AM)-   Flavored water L (12 PM)-  1 frzn dinner: BBQ chx, potatoes, flavored water Snk (3 PM)-  ? D (6 PM)-  1 frzn dinner: meatloaf, green potatoes, beans, flavored water Snk ( PM)-  ? Typical day? Yes.  Cannot remember for sure.     Nutritional Diagnosis:  NI-5.8.2 Excessive  carbohydrate intake As related to starchy foods and fruit juices.  As evidenced by breakfast yesterday providing the equivalent of 4 carb portions.  Handouts given during visit include:  After-Visit Summary (AVS)  Demonstrated degree of understanding via:  Teach Back  Barriers to learning/adherence to lifestyle change: Limited nutrition knowledge.   Monitoring/Evaluation:  Dietary intake, exercise, and body weight prn.

## 2020-01-04 NOTE — Patient Instructions (Addendum)
  Diet Recommendations for Diabetes  Carbohydrate includes starch, sugar, and fiber.  Of these, only sugar and starch raise blood glucose.  (Fiber is found in fruits, vegetables [especially skin, seeds, and stalks], whole grains, and beans.)   Starchy (carb) foods: Bread, rice, pasta, potatoes, corn, cereal, grits, crackers, bagels, muffins, all baked goods.  (Fruit, milk, and yogurt also have carbohydrate, but most of these foods will not spike your blood sugar as most starchy foods will.)  A few fruits do cause high blood sugars; use small portions of bananas (limit to 1/2 at a time), grapes, watermelon, oranges, and most tropical fruits.   Protein foods: Meat, fish, poultry, eggs, dairy foods, and beans such as pinto and kidney beans (beans also provide carbohydrate).   1. Limit starchy foods to ONE to TWO per meal and ONE per snack. ONE portion of a starchy food is equal to the following:   - ONE slice of bread (or its equivalent, such as half of a hamburger bun).   - 1/2 cup of a "scoopable" starchy food such as potatoes or rice.   - 15 grams of Total Carbohydrate as shown on food label.   - Every 4 ounces of a sweet drink (including fruit juice). 2. Include at every lunch and dinner: a protein food, a carb food, and vegetables.   - Whenever possible, obtain twice the volume of veg's as protein or carbohydrate foods for both lunch and dinner.   - Fresh or frozen vegetables are best.   - Keep frozen vegetables on hand for a quick option.    3. Exercise using your home exercise machines at least 20 minutes 5 times per week.  Keep in mind it is also helpful to stay active during the day, not sitting for hours at a time.       - Each time you exercise, record the number of minutes on your kitchen calendar.    If you absolutely want to drink some fruit juice, try mixing it half and half with club soda.  This dilutes the sugar and calorie content by half.    Call for a follow-up appointment any  time after late-September: [phone].

## 2020-02-16 ENCOUNTER — Ambulatory Visit (HOSPITAL_COMMUNITY)
Admission: RE | Admit: 2020-02-16 | Payer: Medicare HMO | Source: Ambulatory Visit | Attending: Physician Assistant | Admitting: Physician Assistant

## 2020-02-26 ENCOUNTER — Encounter: Payer: Self-pay | Admitting: Family Medicine

## 2020-03-01 ENCOUNTER — Ambulatory Visit (HOSPITAL_COMMUNITY): Payer: Medicare HMO | Attending: Cardiology

## 2020-03-01 ENCOUNTER — Other Ambulatory Visit: Payer: Self-pay

## 2020-03-01 DIAGNOSIS — I062 Rheumatic aortic stenosis with insufficiency: Secondary | ICD-10-CM | POA: Diagnosis present

## 2020-03-01 LAB — ECHOCARDIOGRAM COMPLETE
AR max vel: 0.81 cm2
AV Area VTI: 0.8 cm2
AV Area mean vel: 0.78 cm2
AV Mean grad: 33 mmHg
AV Peak grad: 49 mmHg
Ao pk vel: 3.5 m/s
Area-P 1/2: 2.8 cm2
P 1/2 time: 639 msec
S' Lateral: 2.8 cm

## 2020-03-07 ENCOUNTER — Telehealth: Payer: Self-pay | Admitting: Family Medicine

## 2020-03-07 NOTE — Telephone Encounter (Signed)
Attempted to call patient about echocardiogram. This now shows some progression of AS--recommend follow up with Cardiology. Left generic voicemail, will discuss at follow up.  Dorris Singh, MD  Family Medicine Teaching Service

## 2020-03-08 ENCOUNTER — Telehealth: Payer: Self-pay | Admitting: Family Medicine

## 2020-03-11 ENCOUNTER — Other Ambulatory Visit: Payer: Self-pay | Admitting: Nurse Practitioner

## 2020-03-11 DIAGNOSIS — I062 Rheumatic aortic stenosis with insufficiency: Secondary | ICD-10-CM

## 2020-03-11 NOTE — Progress Notes (Signed)
Am re

## 2020-03-15 ENCOUNTER — Telehealth: Payer: Self-pay | Admitting: Physician Assistant

## 2020-03-15 NOTE — Telephone Encounter (Signed)
  [facility]   MULTIDISCIPLINARY HEART VALVE TEAM  Called several times to try to make an appt with structural heart. No answer. Left two voicemails.  Angelena Form PA-C  [facility]

## 2020-03-18 ENCOUNTER — Encounter: Payer: Self-pay | Admitting: Family Medicine

## 2020-03-18 ENCOUNTER — Other Ambulatory Visit: Payer: Self-pay

## 2020-03-18 ENCOUNTER — Ambulatory Visit (INDEPENDENT_AMBULATORY_CARE_PROVIDER_SITE_OTHER): Payer: Medicare HMO | Admitting: Family Medicine

## 2020-03-18 VITALS — BP 112/80 | HR 87 | Wt 168.4 lb

## 2020-03-18 DIAGNOSIS — G4700 Insomnia, unspecified: Secondary | ICD-10-CM | POA: Diagnosis not present

## 2020-03-18 DIAGNOSIS — I350 Nonrheumatic aortic (valve) stenosis: Secondary | ICD-10-CM

## 2020-03-18 DIAGNOSIS — Z23 Encounter for immunization: Secondary | ICD-10-CM | POA: Diagnosis not present

## 2020-03-18 DIAGNOSIS — E1159 Type 2 diabetes mellitus with other circulatory complications: Secondary | ICD-10-CM | POA: Diagnosis not present

## 2020-03-18 DIAGNOSIS — F339 Major depressive disorder, recurrent, unspecified: Secondary | ICD-10-CM

## 2020-03-18 DIAGNOSIS — E038 Other specified hypothyroidism: Secondary | ICD-10-CM

## 2020-03-18 LAB — POCT GLYCOSYLATED HEMOGLOBIN (HGB A1C): HbA1c, POC (controlled diabetic range): 6.2 % (ref 0.0–7.0)

## 2020-03-18 MED ORDER — AMITRIPTYLINE HCL 50 MG PO TABS: 100.0000 mg | ORAL_TABLET | Freq: Every day | ORAL | 2 refills | Status: DC

## 2020-03-18 NOTE — Assessment & Plan Note (Signed)
Once again discussed high dose and adverse effects of amitriptyline. I strongly suspect this is contributing to nocturnal symptoms. Discussed and dose reduced to 100 mg.

## 2020-03-18 NOTE — Patient Instructions (Signed)
It was wonderful to see you today.  Please bring ALL of your medications with you to every visit.   Today we talked about: -Great work on your diabetes  - Call me about your amitriptyline   - Follow up in 3 months    Thank you for choosing [facility].   Please call [phone] with any questions about today's appointment.  Please be sure to schedule follow up at the front  desk before you leave today.   Dorris Singh, MD  Family Medicine

## 2020-03-18 NOTE — Telephone Encounter (Signed)
Received VM on nurse line from patient's daughter, stating that patient does not have any more refills on amitriptyline.   Medication pended to this encounter.   Talbot Grumbling, RN

## 2020-03-18 NOTE — Assessment & Plan Note (Signed)
Has follow up with Cardiology.

## 2020-03-18 NOTE — Progress Notes (Signed)
SUBJECTIVE:   CHIEF COMPLAINT / HPI:   Yvonne [name] is a pleasant 79 year old woman presenting today for follow up. Joined by caregiver of 5 years. Medical history of diabetes, severe aortic stenosis, HTN, and hypothyroidism.   She reports overall she is okay.   She reports ongoing issues with her feet. She reports at night she sometimes feels slow with walking.The patient continues to have insomnia. She reports she has cut down to two amitriptyline  (now at 100 mg) at night.   She has had two witnessed falls (during the day). One was on wet pavement while trying to get out of the car, the other she tripped on item at home. Both witnessed. No dizziness, syncope, head trauma.  Has a Life Alert but leaves this on her vanity in bath. Denies back pain, weakness, numbness. She does report her feet have always given her issues. No ulcers, pain. She has a history of pez cavus and hallux valgus.   She reports she is anxious but ready to see Cardiology. Encouraged to keep upcoming appointments.  At last visit, patient had A1C in diabetic range (+ prior fasting BG In diabetic range). She has cut out sweets. No polyuria, polydipsia. Weight is stable.   In terms of HTN, she denies dizziness, syncope. She denies chest pain, orthopnea, or LE edema.   PERTINENT  PMH / PSH/Family/Social History : Reviewed and updated.    Current Outpatient Medications:  .  Alcohol Swabs (B-D SINGLE USE SWABS REGULAR) PADS, Use to check blood sugar once daily in the morning., Disp: 100 each, Rfl: 3 .  amLODipine (NORVASC) 5 MG tablet, Take 1 tablet (5 mg total) by mouth at bedtime., Disp: 90 tablet, Rfl: 3 .  aspirin 81 MG chewable tablet, Chew 1 tablet (81 mg total) by mouth daily., Disp: 90 tablet, Rfl: 3 .  atorvastatin (LIPITOR) 10 MG tablet, TAKE 1 TABLET EVERY DAY, Disp: 90 tablet, Rfl: 3 .  benazepril (LOTENSIN) 40 MG tablet, Take 1 tablet (40 mg total) by mouth daily., Disp: 90 tablet, Rfl: 3 .  gabapentin  (NEURONTIN) 100 MG capsule, Take 1 capsule (100 mg total) by mouth 3 (three) times daily., Disp: 270 capsule, Rfl: 3 .  glucose blood (RELION TRUE METRIX TEST STRIPS) test strip, Use as instructed, Disp: 100 each, Rfl: 12 .  levothyroxine (SYNTHROID) 75 MCG tablet, Take 1 tablet (75 mcg total) by mouth daily., Disp: 90 tablet, Rfl: 3 .  amitriptyline (ELAVIL) 50 MG tablet, Take 2 tablets (100 mg total) by mouth at bedtime. Needs visit before next refill, Disp: 60 tablet, Rfl: 2 .  b complex vitamins tablet, Take 1 tablet by mouth daily., Disp: , Rfl:  .  Blood Glucose Calibration (TRUE METRIX LEVEL 1) Low SOLN, Use to perform quality check on glucometer as needed., Disp: 1 each, Rfl: 0 .  Blood Glucose Monitoring Suppl (TRUE METRIX METER) w/Device KIT, Use to check blood glucose once daily in morning, Disp: 1 kit, Rfl: 0 .  hydrochlorothiazide (HYDRODIURIL) 25 MG tablet, Take 1 tablet (25 mg total) by mouth daily., Disp: 90 tablet, Rfl: 3 .  Lancets 33G MISC, Use to check blood glucose once in morning, Disp: 100 each, Rfl: 3 .  Multiple Vitamin (MULTIVITAMIN WITH MINERALS) TABS tablet, Take 1 tablet by mouth daily with breakfast., Disp: , Rfl:    OBJECTIVE:   BP 112/80   Pulse 87   Wt 168 lb 6.4 oz (76.4 kg)   SpO2 94%  BMI 31.82 kg/m   HEENT: Sclera anicteric. Dentition is moderate. Appears well hydrated. Neck: Supple Cardiac: Regular rate and rhythm. Normal S1/S2 Harsh 3/6 late peaking murmur appreciated throughout precordium.  Lungs: Clear bilaterally to ascultation.  Abdomen: Normoactive bowel sounds. No tenderness to deep or light palpation. No rebound or guarding.  Extremities: Warm, well perfused without edema.  Feet- sensate on exam, no ulcers, pez cavus and right sided hallux valgus  Shuffling rather slow gait     ASSESSMENT/PLAN:   Aortic stenosis Has follow up with Cardiology.   Controlled type 2 diabetes mellitus with chronic kidney disease, without long-term current  use of insulin ([place]) Now in prediabetic range, congratulated, diet controlled. Continue to monitor--repeat in 6 months.   INSOMNIA, PERSISTENT Once again discussed high dose and adverse effects of amitriptyline. I strongly suspect this is contributing to nocturnal symptoms. Discussed and dose reduced to 100 mg.   Hypothyroidism TSH in February.    Frequent Falls-discussed, discussed how medications could contribute. Orthostatics at follow up. Discussed home PT, declined.   She has already received COVID vaccine. She will be due for a booster in November.   At follow up--- discuss gait and feet further---suspect spinal stenosis is large contributor to symptoms + medication. BMP and Lipids in February (with TSH).    Dorris Singh, [place]

## 2020-03-18 NOTE — Assessment & Plan Note (Signed)
Now in prediabetic range, congratulated, diet controlled. Continue to monitor--repeat in 6 months.

## 2020-03-18 NOTE — Assessment & Plan Note (Signed)
TSH in February.

## 2020-03-21 ENCOUNTER — Other Ambulatory Visit: Payer: Self-pay | Admitting: *Deleted

## 2020-03-21 DIAGNOSIS — F339 Major depressive disorder, recurrent, unspecified: Secondary | ICD-10-CM

## 2020-03-21 MED ORDER — AMITRIPTYLINE HCL 50 MG PO TABS: 100.0000 mg | ORAL_TABLET | Freq: Every day | ORAL | 2 refills | Status: DC

## 2020-03-23 NOTE — Progress Notes (Signed)
[facility] Consult Note  Chief Complaint  Patient presents with  . New Patient (Initial Visit)    Severe AS   History of Present Illness: 79 yo female with history of arthritis, carotid artery disease, borderline diabetes, HTN, hypothyroidism, remote stroke at age 72 and severe aortic stenosis who is here today as a new consult, referred by Dr. Acie Fredrickson, for further discussion regarding her aortic stenosis. Her aortic stenosis has been moderate and has been followed by Dr. Acie Fredrickson for several years. She had a stroke at age 66 and is known to have a chronic left internal carotid artery occlusion. She has had right sided arm and leg weakness since then and has neuropathy in both feet. She had a craniotomy in her 74s with removal of a benign tumor. Most recent echo 03/01/20 with LVEF=65-70%, moderate LVH. The aortic valve leaflets are thickened and calcified with mean gradient 33 mmHg, AVA 0.8 cm2 and dimensionless index of 0.23. The valve leaflets are restricted but open. 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   She tells me today that she feels well overall. She has no chest pain, dyspnea, dizziness, lower extremity edema. No palpitations. She lives alone in [place]. She has an aide who works with her 2 hours per day in her home. She is with the patient today. She was driving up to one month ago. She cannot keep her feet on the pedals well now so stopped driving. She is a retired Pharmacist, hospital. She has partial dentures. She has no active dental issues and sees a dentist regularly.   Primary Care Physician: Martyn Malay, MD Primary Cardiologist: Nahser Referring Cardiologist: Nahser  Past Medical History:  Diagnosis Date  . Anemia   . Anxiety   . Aortic stenosis    severe by 2021 echo   . Arthritis   . Bilateral carotid artery disease ([place]) 02/04/2017   Carotid US 01/2019:R 1-39;L Total Occlusion; Repeat 1  Year  . Controlled type 2 diabetes mellitus with chronic kidney disease, without long-term current use of insulin ([place]) 11/27/2019  . Depression, recurrent ([place]) 02/10/2019  . Hypertension   . Hypothyroidism   . Impaired functional mobility, balance, gait, and endurance 09/02/2015  . Mild cognitive impairment with memory loss 09/02/2015  . Osteopenia   . Stroke [facility])    pt was 65    Past Surgical History:  Procedure Laterality Date  . ABDOMINAL HYSTERECTOMY    . BRAIN SURGERY     age 30's  . TOE AMPUTATION  2013   2nd toe on right foot, hammer toe  . TONSILLECTOMY    . TOTAL HIP ARTHROPLASTY Left 03/19/2017   Procedure: LEFT TOTAL HIP ARTHROPLASTY ANTERIOR APPROACH;  Surgeon: Mcarthur Rossetti, MD;  Location: [facility];  Service: Orthopedics;  Laterality: Left;    Current Outpatient Medications  Medication Sig Dispense Refill  . Alcohol Swabs (B-D SINGLE USE SWABS REGULAR) PADS Use to check blood sugar once daily in the morning. 100 each 3  . amitriptyline (ELAVIL) 50 MG tablet Take 2 tablets (100 mg total) by mouth at bedtime. Needs visit before next refill 60 tablet 2  . amLODipine (NORVASC) 5 MG tablet Take 1 tablet (5 mg total) by mouth at bedtime. 90 tablet 3  . aspirin 81 MG chewable tablet Chew 1 tablet (81 mg total) by mouth daily. 90 tablet 3  . atorvastatin (LIPITOR) 10 MG tablet TAKE 1 TABLET EVERY DAY  90 tablet 3  . b complex vitamins tablet Take 1 tablet by mouth daily.    . benazepril (LOTENSIN) 40 MG tablet Take 1 tablet (40 mg total) by mouth daily. 90 tablet 3  . Blood Glucose Calibration (TRUE METRIX LEVEL 1) Low SOLN Use to perform quality check on glucometer as needed. 1 each 0  . Blood Glucose Monitoring Suppl (TRUE METRIX METER) w/Device KIT Use to check blood glucose once daily in morning 1 kit 0  . gabapentin (NEURONTIN) 100 MG capsule Take 1 capsule (100 mg total) by mouth 3 (three) times daily. 270 capsule 3  . glucose blood (RELION TRUE METRIX TEST STRIPS)  test strip Use as instructed 100 each 12  . hydrochlorothiazide (HYDRODIURIL) 25 MG tablet Take 1 tablet (25 mg total) by mouth daily. 90 tablet 3  . Lancets 33G MISC Use to check blood glucose once in morning 100 each 3  . levothyroxine (SYNTHROID) 75 MCG tablet Take 1 tablet (75 mcg total) by mouth daily. 90 tablet 3  . Multiple Vitamin (MULTIVITAMIN WITH MINERALS) TABS tablet Take 1 tablet by mouth daily with breakfast.     No current facility-administered medications for this visit.    No Known Allergies  Social History   Socioeconomic History  . Marital status: Widowed    Spouse name: Not on file  . Number of children: 2  . Years of education: masters  . Highest education level: Not on file  Occupational History  . Occupation: Primary school teacher: RETIRED  Tobacco Use  . Smoking status: Never Smoker  . Smokeless tobacco: Never Used  Vaping Use  . Vaping Use: Never used  Substance and Sexual Activity  . Alcohol use: Not Currently    Alcohol/week: 1.0 standard drink    Types: 1 Glasses of wine per week  . Drug use: No  . Sexual activity: Not Currently    Birth control/protection: Post-menopausal  Other Topics Concern  . Not on file  Social History Narrative   Health Care POA:    Emergency Contact: son, Doreene Eland, (c) [phone]   End of Life Plan:    Who lives with you: lives alone now    Any pets: 2 cats, Sadie and Joellen [place]   Diet: Pt has a varied diet and tries to limit sweets.    Exercise: Pt walks on treadmill 10 minutes a day and uses bike 10 minutes a day.   Seatbelts: Pt reports wearing seatbelt when in vehicles.    Sun Exposure/Protection: Pt does not use sun protection.   Hobbies: reading, watching movies, writing      Husband passed away about 10 years ago    Social Determinants of Health   Financial Resource Strain:   . Difficulty of Paying Living Expenses: Not on file  Food Insecurity:   . Worried About Charity fundraiser in the Last Year:  Not on file  . Ran Out of Food in the Last Year: Not on file  Transportation Needs:   . Lack of Transportation (Medical): Not on file  . Lack of Transportation (Non-Medical): Not on file  Physical Activity:   . Days of Exercise per Week: Not on file  . Minutes of Exercise per Session: Not on file  Stress:   . Feeling of Stress : Not on file  Social Connections:   . Frequency of Communication with Friends and Family: Not on file  . Frequency of Social Gatherings with Friends and Family: Not on file  .  Attends Religious Services: Not on file  . Active Member of Clubs or Organizations: Not on file  . Attends Banker Meetings: Not on file  . Marital Status: Not on file  Intimate Partner Violence:   . Fear of Current or Ex-Partner: Not on file  . Emotionally Abused: Not on file  . Physically Abused: Not on file  . Sexually Abused: Not on file    Family History  Problem Relation Age of Onset  . Thyroid disease Mother   . Heart disease Father     Review of Systems:  As stated in the HPI and otherwise negative.   BP 102/70   Pulse 83   Ht 5\' 1"  (1.549 m)   Wt 167 lb 12.8 oz (76.1 kg)   SpO2 94%   BMI 31.71 kg/m   Physical Examination: General: Well developed, well nourished, NAD  HEENT: OP clear, mucus membranes moist  SKIN: warm, dry. No rashes. Neuro: No focal deficits  Musculoskeletal: Muscle strength 5/5 all ext  Psychiatric: Mood and affect normal  Neck: No JVD, no carotid bruits, no thyromegaly, no lymphadenopathy.  Lungs:Clear bilaterally, no wheezes, rhonci, crackles Cardiovascular: Regular rate and rhythm. Loud, harsh, late peaking systolic murmur.  Abdomen:Soft. Bowel sounds present. Non-tender.  Extremities: No lower extremity edema. Pulses are 2 + in the bilateral DP/PT.  EKG:  EKG is ordered today. The ekg ordered today demonstrates sinus  Echo 03/01/20: 1. The aortic valve is severely calcified and restricted in motion.  Visually, this  appears to be severe stenosis. Vmax 3.5 m/s, MG 33 mmHG,  AVA 0.80cm2, DI 0.23. SVI = 38 cc/m2. Overall, with borderline SVI and  small LV cavity, suspect this is  paradoxical low flow low gradient severe aortic stenosis. Would recommend  to clarify with aortic valve calcium score. The aortic valve is tricuspid.  There is severe calcifcation of the aortic valve. There is severe  thickening of the aortic valve. Aortic  valve regurgitation is mild. Moderate to severe aortic valve stenosis.  Aortic valve area, by VTI measures 0.80 cm. Aortic valve mean gradient  measures 33.0 mmHg. Aortic valve Vmax measures 3.50 m/s.  2. Left ventricular ejection fraction, by estimation, is 65 to 70%. The  left ventricle has normal function. The left ventricle has no regional  wall motion abnormalities. There is moderate concentric left ventricular  hypertrophy. Left ventricular  diastolic function could not be evaluated. The average left ventricular  global longitudinal strain is -20.0 %. The global longitudinal strain is  normal.  3. Right ventricular systolic function is normal. The right ventricular  size is normal. There is normal pulmonary artery systolic pressure. The  estimated right ventricular systolic pressure is 30.7 mmHg.  4. A small pericardial effusion is present. The pericardial effusion is  circumferential. There is no evidence of cardiac tamponade.  5. The mitral valve is degenerative. Trivial mitral valve regurgitation.  No evidence of mitral stenosis.  6. There is mild dilatation of the ascending aorta, measuring 42 mm.  7. The inferior vena cava is normal in size with greater than 50%  respiratory variability, suggesting right atrial pressure of 3 mmHg.   Comparison(s): No significant change from prior study. 08/03/19 EF 60-65%.  Moderate-severe AS 08/05/19 mean PG, peak PG.   FINDINGS  Left Ventricle: Left ventricular ejection fraction, by estimation, is 65  to 70%.  The left ventricle has normal function. The left ventricle has no  regional wall motion abnormalities. The average left ventricular  global  longitudinal strain is -20.0 %.  The global longitudinal strain is normal. The left ventricular internal  cavity size was normal in size. There is moderate concentric left  ventricular hypertrophy. Left ventricular diastolic function could not be  evaluated due to mitral annular  calcification (moderate or greater). Left ventricular diastolic function  could not be evaluated.   Right Ventricle: The right ventricular size is normal. No increase in  right ventricular wall thickness. Right ventricular systolic function is  normal. There is normal pulmonary artery systolic pressure. The tricuspid  regurgitant velocity is 2.63 m/s, and  with an assumed right atrial pressure of 3 mmHg, the estimated right  ventricular systolic pressure is 30.8 mmHg.   Left Atrium: Left atrial size was normal in size.   Right Atrium: Right atrial size was normal in size.   Pericardium: A small pericardial effusion is present. The pericardial  effusion is circumferential. There is no evidence of cardiac tamponade.   Mitral Valve: The mitral valve is degenerative in appearance. Mild to  moderate mitral annular calcification. Trivial mitral valve regurgitation.  No evidence of mitral valve stenosis. MV peak gradient, 13.0 mmHg. The  mean mitral valve gradient is 3.0  mmHg.   Tricuspid Valve: The tricuspid valve is grossly normal. Tricuspid valve  regurgitation is trivial. No evidence of tricuspid stenosis.   Aortic Valve: The aortic valve is severely calcified and restricted in  motion. Visually, this appears to be severe stenosis. Vmax 3.5 m/s, MG 33  mmHG, AVA 0.80cm2, DI 0.23. SVI = 38 cc/m2. Overall, with borderline SVI  and small LV cavity, suspect this is  paradoxical low flow low gradient severe aortic stenosis. Would recommend  to clarify with aortic valve  calcium score. The aortic valve is tricuspid.  There is severe calcifcation of the aortic valve. There is severe  thickening of the aortic valve. Aortic  valve regurgitation is mild. Aortic regurgitation PHT measures 639 msec.  Moderate to severe aortic stenosis is present. Aortic valve mean gradient  measures 33.0 mmHg. Aortic valve peak gradient measures 49.0 mmHg. Aortic  valve area, by VTI measures 0.80  cm.   Pulmonic Valve: The pulmonic valve was grossly normal. Pulmonic valve  regurgitation is trivial. No evidence of pulmonic stenosis.   Aorta: The aortic root is normal in size and structure. There is mild  dilatation of the ascending aorta, measuring 42 mm.   Venous: The inferior vena cava is normal in size with greater than 50%  respiratory variability, suggesting right atrial pressure of 3 mmHg.   IAS/Shunts: The atrial septum is grossly normal.     LEFT VENTRICLE  PLAX 2D  LVIDd:     4.00 cm Diastology  LVIDs:     2.80 cm LV e' medial:  4.13 cm/s  LV PW:     1.50 cm LV E/e' medial: 22.4  LV IVS:    1.50 cm LV e' lateral:  5.00 cm/s  LVOT diam:   1.90 cm LV E/e' lateral: 18.5  LV SV:     67  LV SV Index:  38    2D Longitudinal Strain  LVOT Area:   2.84 cm 2D Strain GLS (A2C):  -22.2 %             2D Strain GLS (A3C):  -20.0 %             2D Strain GLS (A4C):  -17.6 %  2D Strain GLS Avg:   -20.0 %               3D Volume EF:             3D EF:    54 %             LV EDV:    111 ml             LV ESV:    51 ml             LV SV:    60 ml   RIGHT VENTRICLE  RV Basal diam: 3.00 cm  RV S prime:   13.10 cm/s  TAPSE (M-mode): 2.0 cm  RVSP:      35.7 mmHg   LEFT ATRIUM       Index    RIGHT ATRIUM      Index  LA diam:    4.70 cm 2.65 cm/m RA Pressure: 8.00 mmHg  LA  Vol (A2C):  49.6 ml 27.96 ml/m RA Area:   9.73 cm  LA Vol (A4C):  58.9 ml 33.20 ml/m RA Volume:  15.20 ml 8.57 ml/m  LA Biplane Vol: 53.6 ml 30.21 ml/m  AORTIC VALVE  AV Area (Vmax):  0.81 cm  AV Area (Vmean):  0.78 cm  AV Area (VTI):   0.80 cm  AV Vmax:      350.00 cm/s  AV Vmean:     246.200 cm/s  AV VTI:      0.840 m  AV Peak Grad:   49.0 mmHg  AV Mean Grad:   33.0 mmHg  LVOT Vmax:     100.00 cm/s  LVOT Vmean:    67.800 cm/s  LVOT VTI:     0.238 m  LVOT/AV VTI ratio: 0.28  AI PHT:      639 msec    AORTA  Ao Root diam: 3.70 cm  Ao Asc diam: 4.00 cm   MITRAL VALVE        TRICUSPID VALVE  MV Area (PHT): 2.80 cm   TR Peak grad:  27.7 mmHg  MV Peak grad: 13.0 mmHg  TR Vmax:    263.00 cm/s  MV Mean grad: 3.0 mmHg   Estimated RAP: 8.00 mmHg  MV Vmax:    1.80 m/s   RVSP:      35.7 mmHg  MV Vmean:   74.3 cm/s  MV Decel Time: 271 msec   SHUNTS  MV E velocity: 92.70 cm/s  Systemic VTI: 0.24 m  MV A velocity: 161.00 cm/s Systemic Diam: 1.90 cm  MV E/A ratio: 0.58   Recent Labs: 07/27/2019: TSH 3.940 07/31/2019: ALT 31; BUN 17; Creatinine, Ser 0.88; Potassium 3.8; Sodium 138   Lipid Panel    Component Value Date/Time   CHOL 125 07/31/2019 0756   TRIG 148 07/31/2019 0756   HDL 56 07/31/2019 0756   CHOLHDL 2.2 07/31/2019 0756   CHOLHDL 3.1 07/25/2016 1155   VLDL 38 (H) 07/25/2016 1155   LDLCALC 44 07/31/2019 0756   LDLDIRECT 89.0 12/08/2014 1019     Wt Readings from Last 3 Encounters:  03/24/20 167 lb 12.8 oz (76.1 kg)  03/18/20 168 lb 6.4 oz (76.4 kg)  01/04/20 172 lb 9.6 oz (78.3 kg)     Other studies Reviewed: Additional studies/ records that were reviewed today include: echo images, EKG, office notes Review of the above records demonstrates: moderately severe to severe AS   Assessment and Plan:   1.  Severe Aortic Valve Stenosis: She has moderately severe to  severe, low flow, low gradient aortic valve stenosis. I have personally reviewed the echo images. The aortic valve is thickened, calcified with limited leaflet mobility however the valve does open. At this time she is asymptomatic. When she becomes symptomatic, will consider AVR. Given advanced age, she is not a good candidate for conventional AVR by surgical approach. I think she may be a good candidate for TAVR.   STS Risk Score: Risk of Mortality: 2.113% Renal Failure: 1.226% Permanent Stroke: 3.797% Prolonged Ventilation: 7.839% DSW Infection: 0.068% Reoperation: 2.659% Morbidity or Mortality: 10.821% Short Length of Stay: 33.710% Long Length of Stay: 4.686%  I have reviewed the natural history of aortic stenosis with the patient and their family members  who are present today. We have discussed the limitations of medical therapy and the poor prognosis associated with symptomatic aortic stenosis. We have reviewed potential treatment options, including palliative medical therapy, conventional surgical aortic valve replacement, and transcatheter aortic valve replacement. We discussed treatment options in the context of the patient's specific comorbid medical conditions.   At this time she seems to be asymptomatic. I will plan to repeat her echo in six months and see her back after the echo. She will call with any change in her symptoms.     Current medicines are reviewed at length with the patient today.  The patient does not have concerns regarding medicines.  The following changes have been made:  no change  Labs/ tests ordered today include:   Orders Placed This Encounter  Procedures  . EKG 12-Lead  . ECHOCARDIOGRAM COMPLETE     Disposition:   FU with  in 6 months   Signed, Lauree Chandler, MD 03/24/2020 2:52 PM    [facility] [address], [place], [place]  [address] Phone: [phone]; Fax: [phone]

## 2020-03-24 ENCOUNTER — Encounter: Payer: Self-pay | Admitting: Cardiovascular Disease

## 2020-03-24 ENCOUNTER — Other Ambulatory Visit: Payer: Self-pay

## 2020-03-24 ENCOUNTER — Ambulatory Visit (INDEPENDENT_AMBULATORY_CARE_PROVIDER_SITE_OTHER): Payer: Medicare HMO | Admitting: Cardiovascular Disease

## 2020-03-24 VITALS — BP 102/70 | HR 83 | Ht 61.0 in | Wt 167.8 lb

## 2020-03-24 DIAGNOSIS — I350 Nonrheumatic aortic (valve) stenosis: Secondary | ICD-10-CM | POA: Diagnosis not present

## 2020-03-24 NOTE — Patient Instructions (Addendum)
Medication Instructions:  Your physician recommends that you continue on your current medications as directed. Please refer to the Current Medication list given to you today.  *If you need a refill on your cardiac medications before your next appointment, please call your pharmacy*  Testing/Procedures: Your physician has requested that you have an echocardiogram in 6 months. Echocardiography is a painless test that uses sound waves to create images of your heart. It provides your doctor with information about the size and shape of your heart and how well your heart's chambers and valves are working. This procedure takes approximately one hour. There are no restrictions for this procedure.  Follow-Up: At [facility], you and your health needs are our priority.  As part of our continuing mission to provide you with exceptional heart care, we have created designated Provider Care Teams.  These Care Teams include your primary Cardiologist (physician) and Advanced Practice Providers (APPs -  Physician Assistants and Nurse Practitioners) who all work together to provide you with the care you need, when you need it.  We recommend signing up for the patient portal called "MyChart".  Sign up information is provided on this After Visit Summary.  MyChart is used to connect with patients for Virtual Visits (Telemedicine).  Patients are able to view lab/test results, encounter notes, upcoming appointments, etc.  Non-urgent messages can be sent to your provider as well.   To learn more about what you can do with MyChart, go to [email or web address].    Your next appointment:   6 month(s) after echocardiogram  The format for your next appointment:   In Person  Provider:   Lauree Chandler, MD

## 2020-04-04 ENCOUNTER — Other Ambulatory Visit: Payer: Self-pay | Admitting: Family Medicine

## 2020-04-04 DIAGNOSIS — Z8673 Personal history of transient ischemic attack (TIA), and cerebral infarction without residual deficits: Secondary | ICD-10-CM

## 2020-04-06 ENCOUNTER — Telehealth: Payer: Self-pay | Admitting: Family Medicine

## 2020-04-06 DIAGNOSIS — M792 Neuralgia and neuritis, unspecified: Secondary | ICD-10-CM

## 2020-04-06 NOTE — Telephone Encounter (Signed)
Received hand written letter from patient to call about neuropathy in feet.   Scheduled for follow up with PCP (self).Discussed--wishes to go back to Podiatry, new referral placed.  Dorris Singh, MD  Family Medicine Teaching Service

## 2020-04-25 ENCOUNTER — Other Ambulatory Visit: Payer: Self-pay

## 2020-04-25 ENCOUNTER — Ambulatory Visit (INDEPENDENT_AMBULATORY_CARE_PROVIDER_SITE_OTHER): Payer: Medicare HMO | Admitting: Podiatry

## 2020-04-25 DIAGNOSIS — E119 Type 2 diabetes mellitus without complications: Secondary | ICD-10-CM

## 2020-04-25 DIAGNOSIS — E0843 Diabetes mellitus due to underlying condition with diabetic autonomic (poly)neuropathy: Secondary | ICD-10-CM | POA: Diagnosis not present

## 2020-04-25 DIAGNOSIS — G9009 Other idiopathic peripheral autonomic neuropathy: Secondary | ICD-10-CM

## 2020-05-03 NOTE — Progress Notes (Signed)
   HPI: 79 y.o. female PMHx DM type II with peripheral polyneuropathy presenting today as a new patient for a routine foot exam to the bilateral lower extremities.  Patient states that she has numbness and tingling to the bilateral lower extremities and has been diagnosed with diabetic neuropathy.  Patient states that she does not experience pain only constant numbness.  She presents for further treatment and evaluation  Past Medical History:  Diagnosis Date  . Anemia   . Anxiety   . Aortic stenosis    severe by 2021 echo   . Arthritis   . Bilateral carotid artery disease ([place]) 02/04/2017   Carotid US 01/2019:R 1-39;L Total Occlusion; Repeat 1 Year  . Controlled type 2 diabetes mellitus with chronic kidney disease, without long-term current use of insulin ([place]) 11/27/2019  . Depression, recurrent ([place]) 02/10/2019  . Hypertension   . Hypothyroidism   . Impaired functional mobility, balance, gait, and endurance 09/02/2015  . Mild cognitive impairment with memory loss 09/02/2015  . Osteopenia   . Stroke [facility])    pt was 89     Physical Exam: General: The patient is alert and oriented x3 in no acute distress.  Dermatology: Skin is warm, dry and supple bilateral lower extremities. Negative for open lesions or macerations.  Vascular: Palpable pedal pulses bilaterally. No edema or erythema noted. Capillary refill within normal limits.  Neurological: Epicritic and protective threshold absent bilaterally.   Musculoskeletal Exam: Range of motion within normal limits to all pedal and ankle joints bilateral. Muscle strength 5/5 in all groups bilateral.  History of right second toe amputation   Assessment: 1.  Diabetes mellitus with peripheral polyneuropathy 2.  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 Patient evaluated.  Comprehensive diabetic foot exam performed today 2.  Appointment with Pedorthist for custom molded insoles and diabetic  shoes 3.  Recommend increasing the patient's gabapentin from 100 mg 2 times daily to 300 mg 2 times daily 4.  Return to clinic annually      Edrick Kins, DPM [facility]  Dr. Edrick Kins, DPM    [address], [place] [address]                Office [phone]  Fax [phone]

## 2020-05-05 ENCOUNTER — Other Ambulatory Visit: Payer: Self-pay

## 2020-05-05 ENCOUNTER — Ambulatory Visit: Payer: Medicare HMO | Admitting: Orthotics

## 2020-05-05 DIAGNOSIS — E0843 Diabetes mellitus due to underlying condition with diabetic autonomic (poly)neuropathy: Secondary | ICD-10-CM

## 2020-05-05 DIAGNOSIS — E119 Type 2 diabetes mellitus without complications: Secondary | ICD-10-CM

## 2020-05-05 NOTE — Progress Notes (Signed)

## 2020-05-06 ENCOUNTER — Encounter: Payer: Self-pay | Admitting: Family Medicine

## 2020-05-06 ENCOUNTER — Other Ambulatory Visit: Payer: Self-pay

## 2020-05-06 ENCOUNTER — Ambulatory Visit (INDEPENDENT_AMBULATORY_CARE_PROVIDER_SITE_OTHER): Payer: Medicare HMO | Admitting: Family Medicine

## 2020-05-06 VITALS — BP 120/72 | HR 91 | Wt 167.0 lb

## 2020-05-06 DIAGNOSIS — I10 Essential (primary) hypertension: Secondary | ICD-10-CM | POA: Diagnosis not present

## 2020-05-06 DIAGNOSIS — R413 Other amnesia: Secondary | ICD-10-CM

## 2020-05-06 DIAGNOSIS — Z8673 Personal history of transient ischemic attack (TIA), and cerebral infarction without residual deficits: Secondary | ICD-10-CM | POA: Diagnosis not present

## 2020-05-06 DIAGNOSIS — G4700 Insomnia, unspecified: Secondary | ICD-10-CM

## 2020-05-06 DIAGNOSIS — F339 Major depressive disorder, recurrent, unspecified: Secondary | ICD-10-CM

## 2020-05-06 DIAGNOSIS — N182 Chronic kidney disease, stage 2 (mild): Secondary | ICD-10-CM

## 2020-05-06 DIAGNOSIS — G629 Polyneuropathy, unspecified: Secondary | ICD-10-CM

## 2020-05-06 DIAGNOSIS — I350 Nonrheumatic aortic (valve) stenosis: Secondary | ICD-10-CM

## 2020-05-06 DIAGNOSIS — E1122 Type 2 diabetes mellitus with diabetic chronic kidney disease: Secondary | ICD-10-CM | POA: Diagnosis not present

## 2020-05-06 MED ORDER — GABAPENTIN 100 MG PO CAPS: ORAL_CAPSULE | ORAL | 3 refills | Status: DC

## 2020-05-06 MED ORDER — HYDROCHLOROTHIAZIDE 12.5 MG PO TABS: 12.4000 mg | ORAL_TABLET | Freq: Every day | ORAL | 3 refills | Status: DC

## 2020-05-06 MED ORDER — AMITRIPTYLINE HCL 50 MG PO TABS: 50.0000 mg | ORAL_TABLET | Freq: Every day | ORAL | 3 refills | Status: DC

## 2020-05-06 NOTE — Progress Notes (Signed)
SUBJECTIVE:   CHIEF COMPLAINT / HPI:  Yvonne [name] is a pleasant 79 year old with history of HTN, CVA, carotid artery disease, and AS presenting today with her caregiver (Andie) for follow up. Patient has several concerns.  Medication Management  The patient brings all of her medications. Her pill box is variably packed. Reviewed and updated medication list.   Falls  She reports ongoing dizziness upon standing.  Orthostatics today are positive going from laying to sitting.  She reports she did have one fall when she got out of bed earlier this month.  She did not lose consciousness or hit her head.  She reports she got up from bed and felt very uneasy and dizzy and then fell back into the bed. She reports frequent dizziness upon standing.   In addition to above symptoms the patient thinks her breathing is getting worse.  Her caregiver has noted heavy labored breathing when she walks.  She is still able to walk about the length of a block and back. She does have to stop due to foot pain but not due to chest pain or dyspnea.  She reports it takes her a long time and she has noticed a change in her breathing..  Last week she had the onset of palpitations that lasted for up to 30 minutes after walking.  She denies chest pain, paroxysmal nocturnal dyspnea, orthopnea or lower extremity edema.  The patient has ongoing bilateral lower extremity numbness.  This has been present since her back surgery but at the end of the day in particular flares.  She has had no recent falls other than the 1 noted above after getting up from the bed.  She was seen by podiatry.  She is interested in increasing her gabapentin.    PERTINENT  PMH / PSH/Family/Social History : Reviewed and updated    OBJECTIVE:   BP 120/72   Pulse 91   Wt 167 lb (75.8 kg)   SpO2 96%   BMI 31.55 kg/m   Today's weight:  Last Weight  Most recent update: 05/06/2020 11:14 AM   Weight  75.8 kg (167 lb)           Review of  prior weights: Filed Weights   05/06/20 1114  Weight: 167 lb (75.8 kg)   Small pink papule on left submandibular area, irritated Bilateral eyes with line, left lid with mild erythema Cardiac: Regular rate and rhythm. Normal S1/S2. Harsh 3/6 systolic murmur appreciated throughout precordium, late peaking in nature  Lungs: Clear bilaterally to ascultation.  LE without edema  Psych: Pleasant and appropriate   Orthostatics noted and positive   ASSESSMENT/PLAN:   Aortic stenosis Patient is having worsening dyspnea when she walks, although does provide variable history. Given severe AS, will message Cardiology team to see if they can reevaluate sooner. Discussed return precautions.   INSOMNIA, PERSISTENT Lifelong insomnia--this is stable but I have continued concerns about amitriptyline. We discussed--reduce to 50 mg. Discussed side effects and risks--goal of slowly taperin goff   Memory impairment Patient provides verbal history of medications and symptoms today.  We discussed importance at length.  Advanced directive packet provided.  She is benefit from geriatric clinic in the future.  Reduced amitriptyline  Today.  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 Gabapentin 200 mg at night.   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  Recommended warm compresses and gentle cleanser.  Advised her not to use her eyeliner regularly  Irritated nevus the most likely cause of the small papule submandibular  area.  Differential also includes keratoacanthoma  Will monitor reviewed return precautions advised her not to use Neosporin.]  Orthostasis, reduce dose of HCTZ to 12.5 mg, Andie will monitor BP. If does not improve symptoms, consider compression stockings, stopping HCTZ, stopping TCA (also likely contributing).    HCM  Recommended Ophtho examination Discuss COVID vaccine again at follow up  Discuss again ASA at follow up A1C at follow up   Dorris Singh, [place]

## 2020-05-06 NOTE — Assessment & Plan Note (Signed)
Patient provides verbal history of medications and symptoms today.  We discussed importance at length.  Advanced directive packet provided.  She is benefit from geriatric clinic in the future.  Reduced amitriptyline  Today.

## 2020-05-06 NOTE — Patient Instructions (Addendum)
It was wonderful to see you today.  Please bring ALL of your medications with you to every visit.   Today we talked about:  Reduce hydrochlorothiazide to 12.5 mg Reduce amitriptyline (elavil) to 1 tablet Gabapentin 1 tab in morning and now 2 at night  I will the heart doctors know about your breathing and chest palpitations    Follow up in 3 months   Bring back the your advance directive packet    Thank you for choosing [facility].   Please call [phone] with any questions about today's appointment.  Please be sure to schedule follow up at the front  desk before you leave today.   Dorris Singh, MD  Family Medicine

## 2020-05-06 NOTE — Assessment & Plan Note (Signed)
Patient is having worsening dyspnea when she walks, although does provide variable history. Given severe AS, will message Cardiology team to see if they can reevaluate sooner.

## 2020-05-06 NOTE — Assessment & Plan Note (Signed)
Lifelong insomnia--this is stable but I have continued concerns about amitriptyline. We discussed--reduce to 50 mg.

## 2020-05-06 NOTE — Assessment & Plan Note (Signed)
Reduce HCTZ to 12.5 mg given orthostasis.

## 2020-05-09 ENCOUNTER — Other Ambulatory Visit: Payer: Self-pay | Admitting: *Deleted

## 2020-05-09 DIAGNOSIS — Z8673 Personal history of transient ischemic attack (TIA), and cerebral infarction without residual deficits: Secondary | ICD-10-CM

## 2020-05-09 DIAGNOSIS — I10 Essential (primary) hypertension: Secondary | ICD-10-CM

## 2020-05-09 MED ORDER — HYDROCHLOROTHIAZIDE 12.5 MG PO TABS: 12.5000 mg | ORAL_TABLET | Freq: Every day | ORAL | 3 refills | Status: DC

## 2020-05-09 NOTE — Telephone Encounter (Signed)
Per pharmacy "please clarify conflicting strength and directions for hydrochlorothiazide 12.5mg  tabs". Jemina Scahill Kennon Holter, CMA

## 2020-05-10 ENCOUNTER — Telehealth: Payer: Self-pay | Admitting: *Deleted

## 2020-05-10 NOTE — Telephone Encounter (Signed)
-----   Message from Burnell Blanks, MD sent at 05/09/2020  1:47 PM EST ----- Let's get her back in sometime in the next few weeks. Thanks, chris ----- Message ----- From: Rodman Key, RN Sent: 05/06/2020   5:11 PM EST To: Burnell Blanks, MD  Dr. Angelena Form, You saw patient in October for aortic stenosis.  Plan was to re eval in 6 months.  Would you like to bring her back sooner?  Ayaansh Smail ----- Message ----- From: Antonieta Iba, RN Sent: 05/06/2020   3:20 PM EST To: Rodman Key, RN   ----- Message ----- From: Martyn Malay, MD Sent: 05/06/2020   3:14 PM EST To: Antonieta Iba, RN  Hello,  I saw Ms. Kasler today---her caregiver has noticed some increased dyspnea on exertion as had the patient. Can we schedule her a bit sooner for follow up with the Dr. Angelena Form?   Thank you, Dorris Singh, MD  [facility] Medicine Teaching Service

## 2020-05-16 ENCOUNTER — Other Ambulatory Visit: Payer: Self-pay | Admitting: Family Medicine

## 2020-05-17 ENCOUNTER — Other Ambulatory Visit: Payer: Self-pay | Admitting: *Deleted

## 2020-05-17 DIAGNOSIS — G629 Polyneuropathy, unspecified: Secondary | ICD-10-CM

## 2020-05-17 MED ORDER — GABAPENTIN 100 MG PO CAPS: ORAL_CAPSULE | ORAL | 3 refills | Status: DC

## 2020-05-17 MED ORDER — BENAZEPRIL HCL 40 MG PO TABS
40.0000 mg | ORAL_TABLET | Freq: Every day | ORAL | 3 refills | Status: DC
Start: 2020-05-17 — End: 2020-09-02

## 2020-05-19 NOTE — Telephone Encounter (Signed)
Left messages on home and mobile numbers to call back.    She could be scheduled with Dr. Angelena Form on 12/16 or 12/29 when she calls back.

## 2020-05-23 ENCOUNTER — Telehealth: Payer: Self-pay | Admitting: Podiatry

## 2020-05-23 NOTE — Telephone Encounter (Signed)
Message was left checking on status of diabetic shoes.  I returned call and left message for pt that we did not get paperwork back until end of November and inserts are in production and should be shipping in a few weeks and I would call when they come in.

## 2020-05-26 NOTE — Telephone Encounter (Signed)
Left messages on home and mobile numbers to call back. ?

## 2020-05-31 ENCOUNTER — Encounter: Payer: Self-pay | Admitting: *Deleted

## 2020-05-31 ENCOUNTER — Other Ambulatory Visit: Payer: Self-pay | Admitting: *Deleted

## 2020-05-31 NOTE — Telephone Encounter (Signed)
Letter placed in outgoing mail-asking patient to call schedule follow up with Dr. Angelena Form

## 2020-06-06 NOTE — Telephone Encounter (Signed)
Left message on pt's mobile number to contact me to arrange office visit with Dr Angelena Form.

## 2020-06-09 ENCOUNTER — Ambulatory Visit (INDEPENDENT_AMBULATORY_CARE_PROVIDER_SITE_OTHER): Payer: Medicare HMO | Admitting: Orthotics

## 2020-06-09 ENCOUNTER — Other Ambulatory Visit: Payer: Self-pay

## 2020-06-09 DIAGNOSIS — R2681 Unsteadiness on feet: Secondary | ICD-10-CM

## 2020-06-09 DIAGNOSIS — E119 Type 2 diabetes mellitus without complications: Secondary | ICD-10-CM

## 2020-06-09 DIAGNOSIS — S98131D Complete traumatic amputation of one right lesser toe, subsequent encounter: Secondary | ICD-10-CM

## 2020-06-09 DIAGNOSIS — E0843 Diabetes mellitus due to underlying condition with diabetic autonomic (poly)neuropathy: Secondary | ICD-10-CM | POA: Diagnosis not present

## 2020-06-09 DIAGNOSIS — E0840 Diabetes mellitus due to underlying condition with diabetic neuropathy, unspecified: Secondary | ICD-10-CM | POA: Diagnosis not present

## 2020-06-09 NOTE — Progress Notes (Signed)

## 2020-06-13 ENCOUNTER — Other Ambulatory Visit: Payer: Self-pay

## 2020-06-14 NOTE — Telephone Encounter (Signed)
Spoke with pt/aide and she is still taking them. It was filled by dr Shawnie Pons per bottle. Rajat Staver Bruna Potter, CMA

## 2020-06-14 NOTE — Telephone Encounter (Signed)
Please call patient and ask if still taking this not. Not recently refilled.  Terisa Starr, MD  Family Medicine Teaching Service

## 2020-06-16 ENCOUNTER — Telehealth: Payer: Self-pay | Admitting: Cardiovascular Disease

## 2020-06-16 NOTE — Telephone Encounter (Signed)
Patient called and wanted to schedule appt with Dr. Clifton James for the 1st or 2nd week in January. Dr. Gibson Ramp next appt is in May 2022. Patient already has appt set with him on 09/21/20 at 10:20 am. Told patient no appts available. Wanted to talk with Dr. Gibson Ramp nurse about appt. Please call back

## 2020-06-16 NOTE — Telephone Encounter (Signed)
Will review at upcoming visit

## 2020-06-16 NOTE — Telephone Encounter (Signed)
Spoke with pt who states her PCP is requesting her appt with Dr Clifton James be moved up to the first or second week of January d/t worsening dyspnea d/t aortic stenosis.  Pt's current appointment scheduled for April 2022.  Will forward request to Dr Gibson Ramp RN.  Pt advised she should receive a call next week re: possibility of moving appointment up. Pt verbalizes understanding and agrees with current plan.

## 2020-06-20 NOTE — Telephone Encounter (Signed)
Left message for patient to call back.  She can be scheduled on 07/04/20 at 2pm.

## 2020-06-21 NOTE — Telephone Encounter (Signed)
Left messages for patient ot call back.  She can be scheduled for tomorrow morning with Dr Clifton James at 9:05 AM.

## 2020-06-24 ENCOUNTER — Other Ambulatory Visit: Payer: Self-pay

## 2020-06-24 ENCOUNTER — Ambulatory Visit (INDEPENDENT_AMBULATORY_CARE_PROVIDER_SITE_OTHER): Payer: Medicare HMO | Admitting: Orthotics

## 2020-06-24 DIAGNOSIS — E0843 Diabetes mellitus due to underlying condition with diabetic autonomic (poly)neuropathy: Secondary | ICD-10-CM

## 2020-07-03 NOTE — Progress Notes (Signed)
Virtual Visit via Video Note   This visit type was conducted due to national recommendations for restrictions regarding the COVID-19 Pandemic (e.g. social distancing) in an effort to limit this patient's exposure and mitigate transmission in our community.  Due to her co-morbid illnesses, this patient is at least at moderate risk for complications without adequate follow up.  This format is felt to be most appropriate for this patient at this time.  All issues noted in this document were discussed and addressed.  A limited physical exam was performed with this format.  Please refer to the patient's chart for her consent to telehealth for [facility]  Video Connection Lost Video connection was lost at > 50% of the duration of this visit, at which time the remainder of the visit was completed via audio only.     Date:  07/04/2020   ID:  Yvonne VANDERHOOF, DOB 02-Jul-1940, MRN [id number]  Patient Location: Home Provider Location: Home Office  PCP:  Martyn Malay, MD  Cardiologist:  Mertie Moores, MD  Electrophysiologist:  None   Evaluation Performed:  Follow-Up Visit  Chief Complaint:  Follow up- Aortic stenosis  History of Present Illness:    80 yo female with history of arthritis, carotid artery disease, borderline diabetes, HTN, hypothyroidism, remote stroke at age 57 and severe aortic stenosis who is being seen today for follow up of her aortic stenosis. I met her in October 2021 for TAVR consult. Her aortic stenosis has been moderate and has been followed by Dr. Acie Fredrickson for several years. She had a stroke at age 12 and is known to have a chronic left internal carotid artery occlusion. She has had right sided arm and leg weakness since then and has neuropathy in both feet. 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 Most recent echo 03/01/20 with LVEF=65-70%, moderate LVH. The aortic valve leaflets are thickened and calcified with mean gradient 33 mmHg, AVA 0.8 cm2  and dimensionless index of 0.23. The valve leaflets are restricted but open. 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 When I met her in October 2021, she denied chest pain, dyspnea, dizziness, lower extremity edema. No palpitations. She lives alone in [place]. She has an aide who works with her 2 hours per day in her home. She is a retired Pharmacist, hospital. She has partial dentures. She has no active dental issues and sees a dentist regularly.   We received a note in November from her primary care provider that she had reported increased dyspnea on exertion. We had trouble getting in touch with her and she was placed on our schedule today. Due to the snow, our office visit was changed to a virtual visit and she could only use the telephone for the visit. She denies any dyspnea on exertion, LE edema, chest pain or dizziness. She denies palpitations, near syncope or syncope.   The patient does not have symptoms concerning for COVID-19 infection (fever, chills, cough, or new shortness of breath).   Primary Care Physician: Martyn Malay, MD Primary Cardiologist: Nahser Referring Cardiologist: Nahser  Past Medical History:  Diagnosis Date  . Anemia   . Anxiety   . Aortic stenosis    severe by 2021 echo   . Arthritis   . Bilateral carotid artery disease ([place]) 02/04/2017   Carotid US 01/2019:R 1-39;L Total Occlusion; Repeat 1 Year  . Controlled type 2  diabetes mellitus with chronic kidney disease, without long-term current use of insulin ([place]) 11/27/2019  . Depression, recurrent ([place]) 02/10/2019  . Hypertension   . Hypothyroidism   . Impaired functional mobility, balance, gait, and endurance 09/02/2015  . Mild cognitive impairment with memory loss 09/02/2015  . Osteopenia   . Stroke [facility])    pt was 42   Past Surgical History:  Procedure Laterality Date  . ABDOMINAL HYSTERECTOMY    . BRAIN SURGERY     age 48's  . TOE  AMPUTATION  2013   2nd toe on right foot, hammer toe  . TONSILLECTOMY    . TOTAL HIP ARTHROPLASTY Left 03/19/2017   Procedure: LEFT TOTAL HIP ARTHROPLASTY ANTERIOR APPROACH;  Surgeon: Mcarthur Rossetti, MD;  Location: [facility];  Service: Orthopedics;  Laterality: Left;     Current Meds  Medication Sig  . Alcohol Swabs (B-D SINGLE USE SWABS REGULAR) PADS Use to check blood sugar once daily in the morning.  Marland Kitchen amitriptyline (ELAVIL) 50 MG tablet Take 1 tablet (50 mg total) by mouth at bedtime.  Marland Kitchen amLODipine (NORVASC) 5 MG tablet Take 1 tablet (5 mg total) by mouth at bedtime.  Marland Kitchen aspirin 81 MG chewable tablet Chew 1 tablet (81 mg total) by mouth daily.  Marland Kitchen atorvastatin (LIPITOR) 10 MG tablet TAKE 1 TABLET EVERY DAY  . b complex vitamins tablet Take 1 tablet by mouth daily.  . benazepril (LOTENSIN) 40 MG tablet Take 1 tablet (40 mg total) by mouth daily.  . Blood Glucose Calibration (TRUE METRIX LEVEL 1) Low SOLN Use to perform quality check on glucometer as needed.  . Blood Glucose Monitoring Suppl (TRUE METRIX METER) w/Device KIT Use to check blood glucose once daily in morning  . gabapentin (NEURONTIN) 100 MG capsule 1 tablet in the morning and 2 at night  . glucose blood (RELION TRUE METRIX TEST STRIPS) test strip Use as instructed  . hydrochlorothiazide (HYDRODIURIL) 12.5 MG tablet Take 1 tablet (12.5 mg total) by mouth daily.  . Lancets 33G MISC Use to check blood glucose once in morning  . levothyroxine (SYNTHROID) 75 MCG tablet Take 1 tablet (75 mcg total) by mouth daily.  . Multiple Vitamin (MULTIVITAMIN WITH MINERALS) TABS tablet Take 1 tablet by mouth daily with breakfast.  . omega-3 acid ethyl esters (LOVAZA) 1 g capsule      Allergies:   Patient has no known allergies.   Social History   Tobacco Use  . Smoking status: Never Smoker  . Smokeless tobacco: Never Used  Vaping Use  . Vaping Use: Never used  Substance Use Topics  . Alcohol use: Not Currently    Alcohol/week:  1.0 standard drink    Types: 1 Glasses of wine per week  . Drug use: No     Family Hx: The patient's family history includes Heart disease in her father; Thyroid disease in her mother.  ROS:   Please see the history of present illness.    All other systems reviewed and are negative.   Prior CV studies:   The following studies were reviewed today:   Echo 03/01/20: 1. The aortic valve is severely calcified and restricted in motion.  Visually, this appears to be severe stenosis. Vmax 3.5 m/s, MG 33 mmHG,  AVA 0.80cm2, DI 0.23. SVI = 38 cc/m2. Overall, with borderline SVI and  small LV cavity, suspect this is  paradoxical low flow low gradient severe aortic stenosis. Would recommend  to clarify with aortic valve calcium score. The  aortic valve is tricuspid.  There is severe calcifcation of the aortic valve. There is severe  thickening of the aortic valve. Aortic  valve regurgitation is mild. Moderate to severe aortic valve stenosis.  Aortic valve area, by VTI measures 0.80 cm. Aortic valve mean gradient  measures 33.0 mmHg. Aortic valve Vmax measures 3.50 m/s.  2. Left ventricular ejection fraction, by estimation, is 65 to 70%. The  left ventricle has normal function. The left ventricle has no regional  wall motion abnormalities. There is moderate concentric left ventricular  hypertrophy. Left ventricular  diastolic function could not be evaluated. The average left ventricular  global longitudinal strain is -20.0 %. The global longitudinal strain is  normal.  3. Right ventricular systolic function is normal. The right ventricular  size is normal. There is normal pulmonary artery systolic pressure. The  estimated right ventricular systolic pressure is 30.7 mmHg.  4. A small pericardial effusion is present. The pericardial effusion is  circumferential. There is no evidence of cardiac tamponade.  5. The mitral valve is degenerative. Trivial mitral valve regurgitation.  No  evidence of mitral stenosis.  6. There is mild dilatation of the ascending aorta, measuring 42 mm.  7. The inferior vena cava is normal in size with greater than 50%  respiratory variability, suggesting right atrial pressure of 3 mmHg.   Comparison(s): No significant change from prior study. 08/03/19 EF 60-65%.  Moderate-severe AS mean PG, peak PG.   FINDINGS  Left Ventricle: Left ventricular ejection fraction, by estimation, is 65  to 70%. The left ventricle has normal function. The left ventricle has no  regional wall motion abnormalities. The average left ventricular global  longitudinal strain is -20.0 %.  The global longitudinal strain is normal. The left ventricular internal  cavity size was normal in size. There is moderate concentric left  ventricular hypertrophy. Left ventricular diastolic function could not be  evaluated due to mitral annular  calcification (moderate or greater). Left ventricular diastolic function  could not be evaluated.   Right Ventricle: The right ventricular size is normal. No increase in  right ventricular wall thickness. Right ventricular systolic function is  normal. There is normal pulmonary artery systolic pressure. The tricuspid  regurgitant velocity is 2.63 m/s, and  with an assumed right atrial pressure of 3 mmHg, the estimated right  ventricular systolic pressure is 30.7 mmHg.   Left Atrium: Left atrial size was normal in size.   Right Atrium: Right atrial size was normal in size.   Pericardium: A small pericardial effusion is present. The pericardial  effusion is circumferential. There is no evidence of cardiac tamponade.   Mitral Valve: The mitral valve is degenerative in appearance. Mild to  moderate mitral annular calcification. Trivial mitral valve regurgitation.  No evidence of mitral valve stenosis. MV peak gradient, 13.0 mmHg. The  mean mitral valve gradient is 3.0  mmHg.   Tricuspid Valve: The tricuspid valve  is grossly normal. Tricuspid valve  regurgitation is trivial. No evidence of tricuspid stenosis.   Aortic Valve: The aortic valve is severely calcified and restricted in  motion. Visually, this appears to be severe stenosis. Vmax 3.5 m/s, MG 33  mmHG, AVA 0.80cm2, DI 0.23. SVI = 38 cc/m2. Overall, with borderline SVI  and small LV cavity, suspect this is  paradoxical low flow low gradient severe aortic stenosis. Would recommend  to clarify with aortic valve calcium score. The aortic valve is tricuspid.  There is severe calcifcation of the aortic valve.  There is severe  thickening of the aortic valve. Aortic  valve regurgitation is mild. Aortic regurgitation PHT measures 639 msec.  Moderate to severe aortic stenosis is present. Aortic valve mean gradient  measures 33.0 mmHg. Aortic valve peak gradient measures 49.0 mmHg. Aortic  valve area, by VTI measures 0.80  cm.   Pulmonic Valve: The pulmonic valve was grossly normal. Pulmonic valve  regurgitation is trivial. No evidence of pulmonic stenosis.   Aorta: The aortic root is normal in size and structure. There is mild  dilatation of the ascending aorta, measuring 42 mm.   Venous: The inferior vena cava is normal in size with greater than 50%  respiratory variability, suggesting right atrial pressure of 3 mmHg.   IAS/Shunts: The atrial septum is grossly normal.     LEFT VENTRICLE  PLAX 2D  LVIDd:     4.00 cm Diastology  LVIDs:     2.80 cm LV e' medial:  4.13 cm/s  LV PW:     1.50 cm LV E/e' medial: 22.4  LV IVS:    1.50 cm LV e' lateral:  5.00 cm/s  LVOT diam:   1.90 cm LV E/e' lateral: 18.5  LV SV:     67  LV SV Index:  38    2D Longitudinal Strain  LVOT Area:   2.84 cm 2D Strain GLS (A2C):  -22.2 %             2D Strain GLS (A3C):  -20.0 %             2D Strain GLS (A4C):  -17.6 %             2D Strain GLS Avg:   -20.0 %                3D Volume EF:             3D EF:    54 %             LV EDV:    111 ml             LV ESV:    51 ml             LV SV:    60 ml   RIGHT VENTRICLE  RV Basal diam: 3.00 cm  RV S prime:   13.10 cm/s  TAPSE (M-mode): 2.0 cm  RVSP:      35.7 mmHg   LEFT ATRIUM       Index    RIGHT ATRIUM      Index  LA diam:    4.70 cm 2.65 cm/m RA Pressure: 8.00 mmHg  LA Vol (A2C):  49.6 ml 27.96 ml/m RA Area:   9.73 cm  LA Vol (A4C):  58.9 ml 33.20 ml/m RA Volume:  15.20 ml 8.57 ml/m  LA Biplane Vol: 53.6 ml 30.21 ml/m  AORTIC VALVE  AV Area (Vmax):  0.81 cm  AV Area (Vmean):  0.78 cm  AV Area (VTI):   0.80 cm  AV Vmax:      350.00 cm/s  AV Vmean:     246.200 cm/s  AV VTI:      0.840 m  AV Peak Grad:   49.0 mmHg  AV Mean Grad:   33.0 mmHg  LVOT Vmax:     100.00 cm/s  LVOT Vmean:    67.800 cm/s  LVOT VTI:     0.238 m  LVOT/AV VTI ratio: 0.28  AI PHT:  639 msec    AORTA  Ao Root diam: 3.70 cm  Ao Asc diam: 4.00 cm   MITRAL VALVE        TRICUSPID VALVE  MV Area (PHT): 2.80 cm   TR Peak grad:  27.7 mmHg  MV Peak grad: 13.0 mmHg  TR Vmax:    263.00 cm/s  MV Mean grad: 3.0 mmHg   Estimated RAP: 8.00 mmHg  MV Vmax:    1.80 m/s   RVSP:      35.7 mmHg  MV Vmean:   74.3 cm/s  MV Decel Time: 271 msec   SHUNTS  MV E velocity: 92.70 cm/s  Systemic VTI: 0.24 m  MV A velocity: 161.00 cm/s Systemic Diam: 1.90 cm  MV E/A ratio: 0.58   Labs/Other Tests and Data Reviewed:    EKG:  No ECG reviewed.  Recent Labs: 07/27/2019: TSH 3.940 07/31/2019: ALT 31; BUN 17; Creatinine, Ser 0.88; Potassium 3.8; Sodium 138   Recent Lipid Panel Lab Results  Component Value Date/Time   CHOL 125 07/31/2019 07:56 AM   TRIG 148 07/31/2019 07:56 AM   HDL 56 07/31/2019 07:56 AM   CHOLHDL 2.2 07/31/2019 07:56 AM    CHOLHDL 3.1 07/25/2016 11:55 AM   LDLCALC 44 07/31/2019 07:56 AM   LDLDIRECT 89.0 12/08/2014 10:19 AM    Wt Readings from Last 3 Encounters:  07/04/20 160 lb (72.6 kg)  05/06/20 167 lb (75.8 kg)  03/24/20 167 lb 12.8 oz (76.1 kg)     Objective:    Vital Signs:  Ht $R'5\' 1"'XP$  (1.549 m)   Wt 160 lb (72.6 kg)   BMI 30.23 kg/m    VITAL SIGNS:  reviewed   ASSESSMENT & PLAN:     1. Severe Aortic Valve Stenosis: She likely has severe, low flow, low gradient aortic valve stenosis. I have personally reviewed the echo images. The aortic valve is thickened, calcified with limited leaflet mobility however the valve does open. Given advanced age, she is not a good candidate for conventional AVR by surgical approach. I think she may be a good candidate for TAVR. She claims to be asymptomatic at this time. It is difficult to sort this out by telemedicine visit.   STS Risk Score: Risk of Mortality: 2.113% Renal Failure: 1.226% Permanent Stroke: 3.797% Prolonged Ventilation: 7.839% DSW Infection: 0.068% Reoperation: 2.659% Morbidity or Mortality: 10.821% Short Length of Stay: 33.710% Long Length of Stay: 4.686%  I have reviewed the natural history of aortic stenosis with the patient and their family members who are present today. We have discussed the limitations of medical therapy and the poor prognosis associated with symptomatic aortic stenosis. We have reviewed potential treatment options, including palliative medical therapy, conventional surgical aortic valve replacement, and transcatheter aortic valve replacement. We discussed treatment options in the context of the patient's specific comorbid medical conditions.   I will plan to see her in the office with a face to face visit over the next few weeks. She reports no symptoms at this time. I have asked her to bring her caregiver to her appt with me as the caregiver was the person who noted that she appeared dyspneic at time.      COVID-19 Education: The signs and symptoms of COVID-19 were discussed with the patient and how to seek care for testing (follow up with PCP or arrange E-visit).  The importance of social distancing was discussed today.  Time:   Today, I have spent 20 minutes with the patient with telehealth  technology discussing the above problems.     Medication Adjustments/Labs and Tests Ordered: Current medicines are reviewed at length with the patient today.  Concerns regarding medicines are outlined above.   Tests Ordered: No orders of the defined types were placed in this encounter.   Medication Changes: No orders of the defined types were placed in this encounter.   Disposition:  Follow up in 4 week(s)  Signed, Lauree Chandler, MD  07/04/2020 2:51 PM    [facility]

## 2020-07-04 ENCOUNTER — Telehealth (INDEPENDENT_AMBULATORY_CARE_PROVIDER_SITE_OTHER): Payer: Medicare HMO | Admitting: Cardiovascular Disease

## 2020-07-04 ENCOUNTER — Encounter: Payer: Self-pay | Admitting: Cardiovascular Disease

## 2020-07-04 ENCOUNTER — Other Ambulatory Visit: Payer: Self-pay

## 2020-07-04 VITALS — Ht 61.0 in | Wt 160.0 lb

## 2020-07-04 DIAGNOSIS — I350 Nonrheumatic aortic (valve) stenosis: Secondary | ICD-10-CM

## 2020-07-04 NOTE — Patient Instructions (Signed)
Medication Instructions:  No changes today *If you need a refill on your cardiac medications before your next appointment, please call your pharmacy*   Lab Work:  none If you have labs (blood work) drawn today and your tests are completely normal, you will receive your results only by: Marland Kitchen MyChart Message (if you have MyChart) OR . A paper copy in the mail If you have any lab test that is abnormal or we need to change your treatment, we will call you to review the results.   Testing/Procedures: none   Follow-Up: At [facility], you and your health needs are our priority.  As part of our continuing mission to provide you with exceptional heart care, we have created designated Provider Care Teams.  These Care Teams include your primary Cardiologist (physician) and Advanced Practice Providers (APPs -  Physician Assistants and Nurse Practitioners) who all work together to provide you with the care you need, when you need it.  We recommend signing up for the patient portal called "MyChart".  Sign up information is provided on this After Visit Summary.  MyChart is used to connect with patients for Virtual Visits (Telemedicine).  Patients are able to view lab/test results, encounter notes, upcoming appointments, etc.  Non-urgent messages can be sent to your provider as well.   To learn more about what you can do with MyChart, go to [email or web address].    Your next appointment:   Monday Feb 21 at 11:20 am with Dr. Angelena Form   Other Instructions

## 2020-07-11 DIAGNOSIS — H903 Sensorineural hearing loss, bilateral: Secondary | ICD-10-CM | POA: Diagnosis not present

## 2020-07-13 ENCOUNTER — Ambulatory Visit: Payer: Medicare HMO | Admitting: Orthotics

## 2020-07-15 DIAGNOSIS — H903 Sensorineural hearing loss, bilateral: Secondary | ICD-10-CM | POA: Diagnosis not present

## 2020-08-02 ENCOUNTER — Ambulatory Visit: Payer: Medicare HMO | Admitting: Orthotics

## 2020-08-02 ENCOUNTER — Other Ambulatory Visit: Payer: Self-pay

## 2020-08-02 DIAGNOSIS — S98131D Complete traumatic amputation of one right lesser toe, subsequent encounter: Secondary | ICD-10-CM

## 2020-08-02 DIAGNOSIS — E0843 Diabetes mellitus due to underlying condition with diabetic autonomic (poly)neuropathy: Secondary | ICD-10-CM

## 2020-08-02 DIAGNOSIS — E119 Type 2 diabetes mellitus without complications: Secondary | ICD-10-CM

## 2020-08-02 NOTE — Progress Notes (Signed)
She pickd up reordered shoes; she likes them and they fit much better than the ones returned.

## 2020-08-08 ENCOUNTER — Encounter: Payer: Self-pay | Admitting: Cardiovascular Disease

## 2020-08-08 ENCOUNTER — Other Ambulatory Visit: Payer: Self-pay

## 2020-08-08 ENCOUNTER — Ambulatory Visit (INDEPENDENT_AMBULATORY_CARE_PROVIDER_SITE_OTHER): Payer: Medicare HMO | Admitting: Cardiovascular Disease

## 2020-08-08 VITALS — BP 140/68 | HR 77 | Ht 61.0 in | Wt 170.0 lb

## 2020-08-08 DIAGNOSIS — I350 Nonrheumatic aortic (valve) stenosis: Secondary | ICD-10-CM

## 2020-08-08 NOTE — Patient Instructions (Signed)
Medication Instructions:  Your physician recommends that you continue on your current medications as directed. Please refer to the Current Medication list given to you today.  *If you need a refill on your cardiac medications before your next appointment, please call your pharmacy*   Lab Work: None   Testing/Procedures: Due at the end of March Your physician has requested that you have an echocardiogram. Echocardiography is a painless test that uses sound waves to create images of your heart. It provides your doctor with information about the size and shape of your heart and how well your heart's chambers and valves are working. This procedure takes approximately one hour. There are no restrictions for this procedure.     Follow-Up: At [facility], you and your health needs are our priority.  As part of our continuing mission to provide you with exceptional heart care, we have created designated Provider Care Teams.  These Care Teams include your primary Cardiologist (physician) and Advanced Practice Providers (APPs -  Physician Assistants and Nurse Practitioners) who all work together to provide you with the care you need, when you need it.  We recommend signing up for the patient portal called "MyChart".  Sign up information is provided on this After Visit Summary.  MyChart is used to connect with patients for Virtual Visits (Telemedicine).  Patients are able to view lab/test results, encounter notes, upcoming appointments, etc.  Non-urgent messages can be sent to your provider as well.   To learn more about what you can do with MyChart, go to [email or web address].    Your next appointment:   3 month(s)  The format for your next appointment:   In Person  Provider:   Dr. Angelena Form

## 2020-08-08 NOTE — Progress Notes (Signed)
Structural Heart Clinic Note  Chief Complaint  Patient presents with  . 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 She was seen as a new consult in October 2021 for further discussion regarding her aortic stenosis. Her aortic stenosis has been moderate and has been followed by Dr. Acie Fredrickson for several years. She had a stroke at age 4 and is known to have a chronic left internal carotid artery occlusion. Last carotid dopplers in September 2020 with mild RICA stenosis and occluded LICA. She has had right sided arm and leg weakness since her stroke and has neuropathy in both feet. 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 Most recent echo 03/01/20 with LVEF=65-70%, moderate LVH. The aortic valve leaflets are thickened and calcified with mean gradient 33 mmHg, AVA 0.8 cm2 and dimensionless index of 0.23. SVI 38. The valve leaflets are restricted. The valve visually appears to have severe stenosis. 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 When I met her in October 2021, she denied chest pain, dyspnea, dizziness, lower extremity edema. No palpitations.She was seen by video visit in January 2022 due to a snow day. She denied any complaints that day, specifically denying any dyspnea at rest or with exertion.   She lives alone in [place]. She has an aide who works with her 2 hours per day in her home. She is a retired Pharmacist, hospital. She has partial dentures. She has no active dental issues and sees a dentist regularly.  She is here today for follow up. The patient denies any chest pain, dyspnea, palpitations,  lower extremity edema, orthopnea, PND, dizziness, near syncope or syncope. She feels well overall. No new fatigue. She has some constipation.   Primary Care Physician: Martyn Malay, MD Primary Cardiologist: Nahser Referring Cardiologist: Nahser  Past Medical History:  Diagnosis Date  . Anemia   . Anxiety   . Aortic stenosis    severe by 2021 echo   . Arthritis   . Bilateral carotid artery disease ([place]) 02/04/2017   Carotid US 01/2019:R 1-39;L Total Occlusion; Repeat 1 Year  . Controlled type 2 diabetes mellitus with chronic kidney disease, without long-term current use of insulin ([place]) 11/27/2019  . Depression, recurrent ([place]) 02/10/2019  . Hypertension   . Hypothyroidism   . Impaired functional mobility, balance, gait, and endurance 09/02/2015  . Mild cognitive impairment with memory loss 09/02/2015  . Osteopenia   . Stroke [facility])    pt was 52    Past Surgical History:  Procedure Laterality Date  . ABDOMINAL HYSTERECTOMY    . BRAIN SURGERY     age 71's  . TOE AMPUTATION  2013   2nd toe on right foot, hammer toe  . TONSILLECTOMY    . TOTAL HIP ARTHROPLASTY Left 03/19/2017   Procedure: LEFT TOTAL HIP ARTHROPLASTY ANTERIOR APPROACH;  Surgeon: Mcarthur Rossetti, MD;  Location: [place];  Service: Orthopedics;  Laterality: Left;    Current Outpatient Medications  Medication Sig Dispense Refill  . Alcohol Swabs (B-D SINGLE USE SWABS REGULAR) PADS Use to check blood sugar once daily in the morning. 100 each 3  .  amitriptyline (ELAVIL) 50 MG tablet Take 1 tablet (50 mg total) by mouth at bedtime. 90 tablet 3  . amLODipine (NORVASC) 5 MG tablet Take 1 tablet (5 mg total) by mouth at bedtime. 90 tablet 3  . aspirin 81 MG chewable tablet Chew 1 tablet (81 mg total) by mouth daily. 90 tablet 3  . atorvastatin (LIPITOR) 10 MG tablet TAKE 1 TABLET EVERY DAY 90 tablet 3  . b complex vitamins tablet Take 1 tablet by mouth daily.    . benazepril (LOTENSIN) 40 MG tablet Take 1 tablet (40  mg total) by mouth daily. 90 tablet 3  . Blood Glucose Calibration (TRUE METRIX LEVEL 1) Low SOLN Use to perform quality check on glucometer as needed. 1 each 0  . Blood Glucose Monitoring Suppl (TRUE METRIX METER) w/Device KIT Use to check blood glucose once daily in morning 1 kit 0  . gabapentin (NEURONTIN) 100 MG capsule 1 tablet in the morning and 2 at night 270 capsule 3  . glucose blood (RELION TRUE METRIX TEST STRIPS) test strip Use as instructed 100 each 12  . hydrochlorothiazide (HYDRODIURIL) 12.5 MG tablet Take 1 tablet (12.5 mg total) by mouth daily. 90 tablet 3  . Lancets 33G MISC Use to check blood glucose once in morning 100 each 3  . levothyroxine (SYNTHROID) 75 MCG tablet Take 1 tablet (75 mcg total) by mouth daily. 90 tablet 3  . Multiple Vitamin (MULTIVITAMIN WITH MINERALS) TABS tablet Take 1 tablet by mouth daily with breakfast.    . omega-3 acid ethyl esters (LOVAZA) 1 g capsule      No current facility-administered medications for this visit.    No Known Allergies  Social History   Socioeconomic History  . Marital status: Widowed    Spouse name: Not on file  . Number of children: 2  . Years of education: masters  . Highest education level: Not on file  Occupational History  . Occupation: Primary school teacher: RETIRED  Tobacco Use  . Smoking status: Never Smoker  . Smokeless tobacco: Never Used  Vaping Use  . Vaping Use: Never used  Substance and Sexual Activity  . Alcohol use: Not Currently    Alcohol/week: 1.0 standard drink    Types: 1 Glasses of wine per week  . Drug use: No  . Sexual activity: Not Currently    Birth control/protection: Post-menopausal  Other Topics Concern  . Not on file  Social History Narrative   Health Care POA:    Emergency Contact: son, Doreene Eland, (c) [phone]   End of Life Plan:    Who lives with you: lives alone now    Any pets: 2 cats, Sadie and Joellen [place]   Diet: Pt has a varied diet and tries to limit sweets.     Exercise: Pt walks on treadmill 10 minutes a day and uses bike 10 minutes a day.   Seatbelts: Pt reports wearing seatbelt when in vehicles.    Sun Exposure/Protection: Pt does not use sun protection.   Hobbies: reading, watching movies, writing      Husband passed away about 10 years ago    Social Determinants of Radio broadcast assistant Strain: Not on file  Food Insecurity: Not on file  Transportation Needs: Not on file  Physical Activity: Not on file  Stress: Not on file  Social Connections: Not on file  Intimate Partner Violence: Not on file    Family History  Problem Relation Age of Onset  .  Thyroid disease Mother   . Heart disease Father     Review of Systems:  As stated in the HPI and otherwise negative.   BP 140/68   Pulse 77   Ht 5\' 1"  (1.549 m)   Wt 170 lb (77.1 kg)   SpO2 95%   BMI 32.12 kg/m   Physical Examination: General: Well developed, well nourished, NAD  HEENT: OP clear, mucus membranes moist  SKIN: warm, dry. No rashes. Neuro: No focal deficits  Musculoskeletal: Muscle strength 5/5 all ext  Psychiatric: Mood and affect normal  Neck: No JVD, no carotid bruits, no thyromegaly, no lymphadenopathy.  Lungs:Clear bilaterally, no wheezes, rhonci, crackles Cardiovascular: Regular rate and rhythm.  Loud, harsh systolic murmur.  Abdomen:Soft. Bowel sounds present. Non-tender.  Extremities: No lower extremity edema. Pulses are 2 + in the bilateral DP/PT.  EKG:  EKG is not ordered today. The ekg ordered today demonstrates   Echo 03/01/20: 1. The aortic valve is severely calcified and restricted in motion.  Visually, this appears to be severe stenosis. Vmax 3.5 m/s, MG 33 mmHG,  AVA 0.80cm2, DI 0.23. SVI = 38 cc/m2. Overall, with borderline SVI and  small LV cavity, suspect this is  paradoxical low flow low gradient severe aortic stenosis. Would recommend  to clarify with aortic valve calcium score. The aortic valve is tricuspid.  There is severe  calcifcation of the aortic valve. There is severe  thickening of the aortic valve. Aortic  valve regurgitation is mild. Moderate to severe aortic valve stenosis.  Aortic valve area, by VTI measures 0.80 cm. Aortic valve mean gradient  measures 33.0 mmHg. Aortic valve Vmax measures 3.50 m/s.  2. Left ventricular ejection fraction, by estimation, is 65 to 70%. The  left ventricle has normal function. The left ventricle has no regional  wall motion abnormalities. There is moderate concentric left ventricular  hypertrophy. Left ventricular  diastolic function could not be evaluated. The average left ventricular  global longitudinal strain is -20.0 %. The global longitudinal strain is  normal.  3. Right ventricular systolic function is normal. The right ventricular  size is normal. There is normal pulmonary artery systolic pressure. The  estimated right ventricular systolic pressure is 30.7 mmHg.  4. A small pericardial effusion is present. The pericardial effusion is  circumferential. There is no evidence of cardiac tamponade.  5. The mitral valve is degenerative. Trivial mitral valve regurgitation.  No evidence of mitral stenosis.  6. There is mild dilatation of the ascending aorta, measuring 42 mm.  7. The inferior vena cava is normal in size with greater than 50%  respiratory variability, suggesting right atrial pressure of 3 mmHg.   Comparison(s): No significant change from prior study. 08/03/19 EF 60-65%.  Moderate-severe AS 08/05/19 mean PG, peak PG.   FINDINGS  Left Ventricle: Left ventricular ejection fraction, by estimation, is 65  to 70%. The left ventricle has normal function. The left ventricle has no  regional wall motion abnormalities. The average left ventricular global  longitudinal strain is -20.0 %.  The global longitudinal strain is normal. The left ventricular internal  cavity size was normal in size. There is moderate concentric left  ventricular  hypertrophy. Left ventricular diastolic function could not be  evaluated due to mitral annular  calcification (moderate or greater). Left ventricular diastolic function  could not be evaluated.   Right Ventricle: The right ventricular size is normal. No increase in  right ventricular wall thickness. Right ventricular systolic function is  normal.  There is normal pulmonary artery systolic pressure. The tricuspid  regurgitant velocity is 2.63 m/s, and  with an assumed right atrial pressure of 3 mmHg, the estimated right  ventricular systolic pressure is 54.0 mmHg.   Left Atrium: Left atrial size was normal in size.   Right Atrium: Right atrial size was normal in size.   Pericardium: A small pericardial effusion is present. The pericardial  effusion is circumferential. There is no evidence of cardiac tamponade.   Mitral Valve: The mitral valve is degenerative in appearance. Mild to  moderate mitral annular calcification. Trivial mitral valve regurgitation.  No evidence of mitral valve stenosis. MV peak gradient, 13.0 mmHg. The  mean mitral valve gradient is 3.0  mmHg.   Tricuspid Valve: The tricuspid valve is grossly normal. Tricuspid valve  regurgitation is trivial. No evidence of tricuspid stenosis.   Aortic Valve: The aortic valve is severely calcified and restricted in  motion. Visually, this appears to be severe stenosis. Vmax 3.5 m/s, MG 33  mmHG, AVA 0.80cm2, DI 0.23. SVI = 38 cc/m2. Overall, with borderline SVI  and small LV cavity, suspect this is  paradoxical low flow low gradient severe aortic stenosis. Would recommend  to clarify with aortic valve calcium score. The aortic valve is tricuspid.  There is severe calcifcation of the aortic valve. There is severe  thickening of the aortic valve. Aortic  valve regurgitation is mild. Aortic regurgitation PHT measures 639 msec.  Moderate to severe aortic stenosis is present. Aortic valve mean gradient  measures 33.0 mmHg.  Aortic valve peak gradient measures 49.0 mmHg. Aortic  valve area, by VTI measures 0.80  cm.   Pulmonic Valve: The pulmonic valve was grossly normal. Pulmonic valve  regurgitation is trivial. No evidence of pulmonic stenosis.   Aorta: The aortic root is normal in size and structure. There is mild  dilatation of the ascending aorta, measuring 42 mm.   Venous: The inferior vena cava is normal in size with greater than 50%  respiratory variability, suggesting right atrial pressure of 3 mmHg.   IAS/Shunts: The atrial septum is grossly normal.     LEFT VENTRICLE  PLAX 2D  LVIDd:     4.00 cm Diastology  LVIDs:     2.80 cm LV e' medial:  4.13 cm/s  LV PW:     1.50 cm LV E/e' medial: 22.4  LV IVS:    1.50 cm LV e' lateral:  5.00 cm/s  LVOT diam:   1.90 cm LV E/e' lateral: 18.5  LV SV:     67  LV SV Index:  38    2D Longitudinal Strain  LVOT Area:   2.84 cm 2D Strain GLS (A2C):  -22.2 %             2D Strain GLS (A3C):  -20.0 %             2D Strain GLS (A4C):  -17.6 %             2D Strain GLS Avg:   -20.0 %               3D Volume EF:             3D EF:    54 %             LV EDV:    111 ml             LV ESV:    51 ml  LV SV:    60 ml   RIGHT VENTRICLE  RV Basal diam: 3.00 cm  RV S prime:   13.10 cm/s  TAPSE (M-mode): 2.0 cm  RVSP:      35.7 mmHg   LEFT ATRIUM       Index    RIGHT ATRIUM      Index  LA diam:    4.70 cm 2.65 cm/m RA Pressure: 8.00 mmHg  LA Vol (A2C):  49.6 ml 27.96 ml/m RA Area:   9.73 cm  LA Vol (A4C):  58.9 ml 33.20 ml/m RA Volume:  15.20 ml 8.57 ml/m  LA Biplane Vol: 53.6 ml 30.21 ml/m  AORTIC VALVE  AV Area (Vmax):  0.81 cm  AV Area (Vmean):  0.78 cm  AV Area (VTI):   0.80 cm  AV Vmax:      350.00 cm/s  AV Vmean:      246.200 cm/s  AV VTI:      0.840 m  AV Peak Grad:   49.0 mmHg  AV Mean Grad:   33.0 mmHg  LVOT Vmax:     100.00 cm/s  LVOT Vmean:    67.800 cm/s  LVOT VTI:     0.238 m  LVOT/AV VTI ratio: 0.28  AI PHT:      639 msec    AORTA  Ao Root diam: 3.70 cm  Ao Asc diam: 4.00 cm   MITRAL VALVE        TRICUSPID VALVE  MV Area (PHT): 2.80 cm   TR Peak grad:  27.7 mmHg  MV Peak grad: 13.0 mmHg  TR Vmax:    263.00 cm/s  MV Mean grad: 3.0 mmHg   Estimated RAP: 8.00 mmHg  MV Vmax:    1.80 m/s   RVSP:      35.7 mmHg  MV Vmean:   74.3 cm/s  MV Decel Time: 271 msec   SHUNTS  MV E velocity: 92.70 cm/s  Systemic VTI: 0.24 m  MV A velocity: 161.00 cm/s Systemic Diam: 1.90 cm  MV E/A ratio: 0.58   Recent Labs: No results found for requested labs within last 8760 hours.    Wt Readings from Last 3 Encounters:  08/08/20 170 lb (77.1 kg)  07/04/20 160 lb (72.6 kg)  05/06/20 167 lb (75.8 kg)     STS Risk Score: Risk of Mortality: 2.113% Renal Failure: 1.226% Permanent Stroke: 3.797% Prolonged Ventilation: 7.839% DSW Infection: 0.068% Reoperation: 2.659% Morbidity or Mortality: 10.821% Short Length of Stay: 33.710% Long Length of Stay: 4.686%   Assessment and Plan:   1. 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 I have reviewed the echo images. The valve leaflets are thickened and calcified with limited excursion and visually appears to have severe stenosis. The mean gradient is only 33 mmHg but her dimensionless index and AVA are in the severe range. Borderline SVI. She remains asymptomatic. While she is probably not a candidate for conventional surgical AVR due to advanced age, she will be a candidate for TAVR when she becomes symptomatic.   I have reviewed the natural history of aortic stenosis with the patient and their family members  who are  present today. We have discussed the limitations of medical therapy and the poor prognosis associated with symptomatic aortic stenosis. We have reviewed potential treatment options, including palliative medical therapy, conventional surgical aortic valve replacement, and transcatheter aortic valve replacement. We discussed treatment options in the context of the patient's  specific comorbid medical conditions.     She remains asymptomatic. Will arrange an echo in March 2022 and I will see her back in the valve clinic in 3-4 months. She will call with a change in her symptoms.   Current medicines are reviewed at length with the patient today.  The patient does not have concerns regarding medicines.  The following changes have been made:  no change  Labs/ tests ordered today include:   Orders Placed This Encounter  Procedures  . ECHOCARDIOGRAM COMPLETE     Disposition:   FU with  in 3-4 months   Signed, Lauree Chandler, MD 08/08/2020 1:30 PM    [address], [place], [place]  [address] Phone: [phone]; Fax: [phone]

## 2020-08-12 ENCOUNTER — Other Ambulatory Visit: Payer: Self-pay | Admitting: *Deleted

## 2020-08-12 DIAGNOSIS — I10 Essential (primary) hypertension: Secondary | ICD-10-CM

## 2020-08-12 DIAGNOSIS — Z8673 Personal history of transient ischemic attack (TIA), and cerebral infarction without residual deficits: Secondary | ICD-10-CM

## 2020-08-12 MED ORDER — AMLODIPINE BESYLATE 5 MG PO TABS: 5.0000 mg | ORAL_TABLET | Freq: Every day | ORAL | 3 refills | Status: DC

## 2020-08-30 ENCOUNTER — Other Ambulatory Visit: Payer: Self-pay

## 2020-09-02 ENCOUNTER — Encounter: Payer: Self-pay | Admitting: Family Medicine

## 2020-09-02 ENCOUNTER — Ambulatory Visit (INDEPENDENT_AMBULATORY_CARE_PROVIDER_SITE_OTHER): Payer: Medicare HMO | Admitting: Family Medicine

## 2020-09-02 ENCOUNTER — Other Ambulatory Visit: Payer: Self-pay

## 2020-09-02 VITALS — BP 140/75 | HR 74 | Ht 61.0 in | Wt 168.4 lb

## 2020-09-02 DIAGNOSIS — I6523 Occlusion and stenosis of bilateral carotid arteries: Secondary | ICD-10-CM | POA: Diagnosis not present

## 2020-09-02 DIAGNOSIS — I350 Nonrheumatic aortic (valve) stenosis: Secondary | ICD-10-CM

## 2020-09-02 DIAGNOSIS — I6529 Occlusion and stenosis of unspecified carotid artery: Secondary | ICD-10-CM | POA: Diagnosis not present

## 2020-09-02 DIAGNOSIS — I10 Essential (primary) hypertension: Secondary | ICD-10-CM

## 2020-09-02 DIAGNOSIS — H5440 Blindness, one eye, unspecified eye: Secondary | ICD-10-CM | POA: Diagnosis not present

## 2020-09-02 DIAGNOSIS — E038 Other specified hypothyroidism: Secondary | ICD-10-CM

## 2020-09-02 DIAGNOSIS — N182 Chronic kidney disease, stage 2 (mild): Secondary | ICD-10-CM

## 2020-09-02 DIAGNOSIS — D72829 Elevated white blood cell count, unspecified: Secondary | ICD-10-CM

## 2020-09-02 DIAGNOSIS — R413 Other amnesia: Secondary | ICD-10-CM | POA: Diagnosis not present

## 2020-09-02 DIAGNOSIS — Z7189 Other specified counseling: Secondary | ICD-10-CM | POA: Insufficient documentation

## 2020-09-02 DIAGNOSIS — E1122 Type 2 diabetes mellitus with diabetic chronic kidney disease: Secondary | ICD-10-CM

## 2020-09-02 LAB — POCT GLYCOSYLATED HEMOGLOBIN (HGB A1C): HbA1c, POC (controlled diabetic range): 6.4 % (ref 0.0–7.0)

## 2020-09-02 MED ORDER — BENAZEPRIL HCL 40 MG PO TABS: 40.0000 mg | ORAL_TABLET | Freq: Every day | ORAL | 3 refills | Status: DC

## 2020-09-02 NOTE — Assessment & Plan Note (Signed)
Due for eye examination, discussed, recommended calling her ophthalmologist. She and Jonni Sanger will do so.

## 2020-09-02 NOTE — Assessment & Plan Note (Signed)
At goal. No falls, no adverse events. Medications refilled, BMP today.

## 2020-09-02 NOTE — Assessment & Plan Note (Signed)
Diet controlled, will move to every 6 month monitoring. Doing well. Eye exam per patient--see separate problem.

## 2020-09-02 NOTE — Patient Instructions (Addendum)
It was wonderful to see you today.  Please bring ALL of your medications with you to every visit.   Today we talked about: -- Going to the lab----I will call with results  ---I will check on the need for a vascular appointment  -- Please give your eye doctor a call    Thank you for choosing [facility].   Please call [phone] with any questions about today's appointment.  Please be sure to schedule follow up at the front  desk before you leave today.   Dorris Singh, MD  Family Medicine

## 2020-09-02 NOTE — Progress Notes (Signed)
    SUBJECTIVE:   CHIEF COMPLAINT / HPI:   Yvonne [name] is a very pleasant 80 year old woman with history significant for severe aortic stenosis, diet-controlled diabetes, hypertension and hearing impairment presenting today for routine follow-up.  She is joined by [name].  The patient has no specific concerns today.  She is several questions about her medications. She wonders if she should continue to take fish oil.  She is taking omega-3's for many years but does not like the taste.  Patient reports a several month history of intermittent palpitations.  These last as long as 5 minutes.  These typically occur either at rest or with exertion.  This resolved spontaneously.  Palpitations typically last no more than 5 minutes.  She reports her heart rate is fast she is not sure consider regular.  It does not feel like her chest.  She denies dyspnea on exertion, lower extremity edema, orthopnea chest pain.  She has a follow-up echocardiogram at the end of this month.  Patient is taking the remainder of her medications as prescribed.  PERTINENT  PMH / PSH/Family/Social History : Updated and reviewed as appropriate of note she last saw vascular surgery, last Dopplers appear to be in 2020 and she has not had a repeat since that time.  OBJECTIVE:   BP 140/75   Pulse 74   Ht 5\' 1"  (1.549 m)   Wt 168 lb 6.4 oz (76.4 kg)   SpO2 93%   BMI 31.82 kg/m   Today's weight:  Last Weight  Most recent update: 09/02/2020 10:04 AM   Weight  76.4 kg (168 lb 6.4 oz)           Review of prior weights: Filed Weights   09/02/20 1003  Weight: 168 lb 6.4 oz (76.4 kg)     Cardiac: Regular rate and rhythm. Normal S1/S2.  Systolic murmur grade 3 out of 6 appreciated at the precordium but best appreciated at the right upper sternal border with radiation to the carotids. Lungs: Clear bilaterally to ascultation.  Abdomen: Normoactive bowel sounds. No tenderness to deep or light palpation. No  rebound or guarding.   Psych: Pleasant and appropriate  Slow slightly antalgic gait. Does not use assistive device   ASSESSMENT/PLAN:   Palpitations, certainly concerning for atrial fibrillation with episodes of NSVT.  Will obtain CBC and TSH today to evaluate for other causes of this.  Consider 2-week monitor therapy or Zio patch if persists.  Bilateral carotid artery disease (HCC) Appears to be due for repeat dopplers, will message [facility] nurse to see if needs scheduled separately for this (has been seeing primarily valve team it appears)   Essential hypertension At goal. No falls, no adverse events. Medications refilled, BMP today.   Blind right eye Due for eye examination, discussed, recommended calling her ophthalmologist. She and Jonni Sanger will do so.    Controlled type 2 diabetes mellitus with chronic kidney disease, without long-term current use of insulin (HCC) Diet controlled, will move to every 6 month monitoring. Doing well. Eye exam per patient--see separate problem.    Hypothyroidism TSH Today  HCM Up to date on vaccines etc. Eye exam as above     Dorris Singh, [place]

## 2020-09-02 NOTE — Assessment & Plan Note (Signed)
Appears to be due for repeat dopplers, will message [facility] nurse to see if needs scheduled separately for this (has been seeing primarily valve team it appears)

## 2020-09-04 LAB — LIPID PANEL
Chol/HDL Ratio: 3.5 ratio (ref 0.0–4.4)
Cholesterol, Total: 208 mg/dL — ABNORMAL HIGH (ref 100–199)
HDL: 60 mg/dL (ref >39)
LDL Chol Calc (NIH): 121 mg/dL — ABNORMAL HIGH (ref 0–99)
Triglycerides: 154 mg/dL — ABNORMAL HIGH (ref 0–149)
VLDL Cholesterol Cal: 27 mg/dL (ref 5–40)

## 2020-09-04 LAB — CBC
Hematocrit: 42.6 % (ref 34.0–46.6)
Hemoglobin: 14.3 g/dL (ref 11.1–15.9)
MCH: 29.5 pg (ref 26.6–33.0)
MCHC: 33.6 g/dL (ref 31.5–35.7)
MCV: 88 fL (ref 79–97)
Platelets: 347 10*3/uL (ref 150–450)
RBC: 4.85 x10E6/uL (ref 3.77–5.28)
RDW: 12.9 % (ref 11.7–15.4)
WBC: 8.4 10*3/uL (ref 3.4–10.8)

## 2020-09-04 LAB — BASIC METABOLIC PANEL
BUN/Creatinine Ratio: 19 (ref 12–28)
BUN: 14 mg/dL (ref 8–27)
CO2: 19 mmol/L — ABNORMAL LOW (ref 20–29)
Calcium: 9.9 mg/dL (ref 8.7–10.3)
Chloride: 97 mmol/L (ref 96–106)
Creatinine, Ser: 0.74 mg/dL (ref 0.57–1.00)
Glucose: 174 mg/dL — ABNORMAL HIGH (ref 65–99)
Potassium: 3.9 mmol/L (ref 3.5–5.2)
Sodium: 143 mmol/L (ref 134–144)
eGFR: 81 mL/min/{1.73_m2} (ref >59)

## 2020-09-04 LAB — TSH: TSH: 5.11 u[IU]/mL — ABNORMAL HIGH (ref 0.450–4.500)

## 2020-09-05 ENCOUNTER — Telehealth: Payer: Self-pay | Admitting: Physician Assistant

## 2020-09-05 ENCOUNTER — Telehealth: Payer: Self-pay | Admitting: Family Medicine

## 2020-09-05 DIAGNOSIS — I779 Disorder of arteries and arterioles, unspecified: Secondary | ICD-10-CM

## 2020-09-05 DIAGNOSIS — E038 Other specified hypothyroidism: Secondary | ICD-10-CM

## 2020-09-05 MED ORDER — ATORVASTATIN CALCIUM 20 MG PO TABS: 20.0000 mg | ORAL_TABLET | Freq: Every day | ORAL | 3 refills | Status: DC

## 2020-09-05 MED ORDER — LEVOTHYROXINE SODIUM 88 MCG PO TABS: 88.0000 ug | ORAL_TABLET | ORAL | 3 refills | Status: DC

## 2020-09-05 NOTE — Telephone Encounter (Signed)
Called patient with results.  TSH elevated (interestingly has never TSH in true hypothyroidal range), Rx for 88 mcg of levothyroxine. Reminder to self to recheck in 6 weeks.  Statin increased given known carotid disease (could increase to high intensity but will wait given age and history of muscle aches)   New Rx of both to pharmacy. Called pharmacy to cancel prior incorrect doses.   Dorris Singh, MD  Family Medicine Teaching Service

## 2020-09-05 NOTE — Telephone Encounter (Signed)
Pt is overdue for carotid US. Please arrange. Dx: carotid stenosis, bilateral I will place the order in [place], [place]    09/05/2020 11:15 AM

## 2020-09-05 NOTE — Telephone Encounter (Signed)
-----   Message from Martyn Malay, MD sent at 09/02/2020  1:57 PM EDT ----- Hi, Please let me know if we should obtain repeat carotids this year or if you would like to see her back for this in the near future. She is seeing Dr. Angelena Form for her AS regularly.  Thanks! Dorris Singh, MD  Family Medicine Teaching Service

## 2020-09-05 NOTE — Telephone Encounter (Signed)
S/w pt is agreeable to treatment plan. Scheduled carotid US for March 24, appt made, orders in and linked, billing explained.

## 2020-09-08 ENCOUNTER — Other Ambulatory Visit: Payer: Self-pay

## 2020-09-08 ENCOUNTER — Ambulatory Visit (HOSPITAL_COMMUNITY)
Admission: RE | Admit: 2020-09-08 | Discharge: 2020-09-08 | Disposition: A | Discharge status: Home / Self Care | Payer: Medicare HMO | Source: Ambulatory Visit | Attending: Cardiovascular Disease | Admitting: Cardiovascular Disease

## 2020-09-08 DIAGNOSIS — I6522 Occlusion and stenosis of left carotid artery: Secondary | ICD-10-CM | POA: Diagnosis not present

## 2020-09-08 DIAGNOSIS — I779 Disorder of arteries and arterioles, unspecified: Secondary | ICD-10-CM | POA: Insufficient documentation

## 2020-09-12 ENCOUNTER — Encounter: Payer: Self-pay | Admitting: Physician Assistant

## 2020-09-14 ENCOUNTER — Other Ambulatory Visit: Payer: Self-pay

## 2020-09-14 ENCOUNTER — Ambulatory Visit (HOSPITAL_COMMUNITY): Payer: Medicare HMO | Attending: Cardiovascular Disease

## 2020-09-14 DIAGNOSIS — I350 Nonrheumatic aortic (valve) stenosis: Secondary | ICD-10-CM | POA: Insufficient documentation

## 2020-09-14 LAB — ECHOCARDIOGRAM COMPLETE
AR max vel: 1.18 cm2
AV Area VTI: 1.42 cm2
AV Area mean vel: 1.25 cm2
AV Mean grad: 25 mmHg
AV Peak grad: 39.7 mmHg
Ao pk vel: 3.15 m/s
Area-P 1/2: 3.01 cm2
P 1/2 time: 531 msec
S' Lateral: 2.7 cm

## 2020-09-16 NOTE — Progress Notes (Signed)
Pt has been made aware of normal result and verbalized understanding.  jw

## 2020-09-19 ENCOUNTER — Telehealth: Payer: Self-pay | Admitting: Cardiovascular Disease

## 2020-09-19 NOTE — Telephone Encounter (Signed)
Reviewed echo results w pt. Reviewed signs/symptoms of worsening aortic stenosis.  She will call if she has any changes, otherwise is aware of her appt 12/15/20.

## 2020-09-19 NOTE — Telephone Encounter (Signed)
Follow Up:      Pt is returning a call from 09-16-20, concerning her ultrasound results.

## 2020-09-20 ENCOUNTER — Telehealth: Payer: Self-pay | Admitting: Cardiovascular Disease

## 2020-09-20 NOTE — Telephone Encounter (Signed)
Left message on mobile number.  Unable to leave a message on home number.  VM full.

## 2020-09-20 NOTE — Telephone Encounter (Signed)
Follow Up:      Pt would like a call, she says she have some more questions about  Her Echo results.

## 2020-09-21 ENCOUNTER — Ambulatory Visit: Payer: Medicare HMO | Admitting: Cardiovascular Disease

## 2020-09-22 ENCOUNTER — Other Ambulatory Visit: Payer: Self-pay

## 2020-09-22 DIAGNOSIS — I350 Nonrheumatic aortic (valve) stenosis: Secondary | ICD-10-CM

## 2020-09-22 DIAGNOSIS — I779 Disorder of arteries and arterioles, unspecified: Secondary | ICD-10-CM

## 2020-09-27 NOTE — Telephone Encounter (Signed)
Reviewed again with patient that her pumping function is normal and no change to aortic valve stenosis.  She is aware to call if any changes in symptoms.  She is scheduled back with Dr. Angelena Form June 30.

## 2020-10-05 DIAGNOSIS — H26491 Other secondary cataract, right eye: Secondary | ICD-10-CM | POA: Diagnosis not present

## 2020-10-05 DIAGNOSIS — Z961 Presence of intraocular lens: Secondary | ICD-10-CM | POA: Diagnosis not present

## 2020-10-05 DIAGNOSIS — H5010 Unspecified exotropia: Secondary | ICD-10-CM | POA: Diagnosis not present

## 2020-10-05 DIAGNOSIS — Z9889 Other specified postprocedural states: Secondary | ICD-10-CM | POA: Diagnosis not present

## 2020-10-05 DIAGNOSIS — R7309 Other abnormal glucose: Secondary | ICD-10-CM | POA: Diagnosis not present

## 2020-10-05 DIAGNOSIS — H04123 Dry eye syndrome of bilateral lacrimal glands: Secondary | ICD-10-CM | POA: Diagnosis not present

## 2020-10-05 DIAGNOSIS — H43813 Vitreous degeneration, bilateral: Secondary | ICD-10-CM | POA: Diagnosis not present

## 2020-10-05 LAB — HM DIABETES EYE EXAM

## 2020-10-07 ENCOUNTER — Encounter: Payer: Self-pay | Admitting: Family Medicine

## 2020-10-19 ENCOUNTER — Other Ambulatory Visit: Payer: Self-pay | Admitting: Family Medicine

## 2020-10-19 ENCOUNTER — Telehealth: Payer: Self-pay | Admitting: Family Medicine

## 2020-10-19 DIAGNOSIS — E038 Other specified hypothyroidism: Secondary | ICD-10-CM

## 2020-10-19 DIAGNOSIS — I779 Disorder of arteries and arterioles, unspecified: Secondary | ICD-10-CM

## 2020-10-19 NOTE — Telephone Encounter (Signed)
Called patient and scheduled lab visit for TSH and direct LDL (changed statin dose).  Dorris Singh, MD  Family Medicine Teaching Service

## 2020-10-20 ENCOUNTER — Other Ambulatory Visit: Payer: Self-pay

## 2020-10-20 ENCOUNTER — Other Ambulatory Visit: Payer: Medicare HMO

## 2020-10-20 DIAGNOSIS — E038 Other specified hypothyroidism: Secondary | ICD-10-CM

## 2020-10-20 DIAGNOSIS — I779 Disorder of arteries and arterioles, unspecified: Secondary | ICD-10-CM

## 2020-10-21 ENCOUNTER — Telehealth: Payer: Self-pay | Admitting: Family Medicine

## 2020-10-21 ENCOUNTER — Encounter: Payer: Self-pay | Admitting: Family Medicine

## 2020-10-21 LAB — LDL CHOLESTEROL, DIRECT: LDL Direct: 57 mg/dL (ref 0–99)

## 2020-10-21 LAB — TSH: TSH: 3.23 u[IU]/mL (ref 0.450–4.500)

## 2020-10-21 NOTE — Telephone Encounter (Signed)
Attempted to call patient. Labs resulted as normal. Left generic message to return call.  If calls back- labs are normal, should continue current dose of thyroid pill and statin.  Will send letter.   Dorris Singh, MD  Family Medicine Teaching Service

## 2020-11-25 ENCOUNTER — Ambulatory Visit (INDEPENDENT_AMBULATORY_CARE_PROVIDER_SITE_OTHER): Payer: Medicare HMO

## 2020-11-25 ENCOUNTER — Encounter: Payer: Self-pay | Admitting: Family Medicine

## 2020-11-25 ENCOUNTER — Other Ambulatory Visit: Payer: Self-pay

## 2020-11-25 ENCOUNTER — Ambulatory Visit (INDEPENDENT_AMBULATORY_CARE_PROVIDER_SITE_OTHER): Payer: Medicare HMO | Admitting: Family Medicine

## 2020-11-25 VITALS — BP 130/70 | HR 72 | Ht 61.0 in | Wt 169.4 lb

## 2020-11-25 DIAGNOSIS — E1122 Type 2 diabetes mellitus with diabetic chronic kidney disease: Secondary | ICD-10-CM

## 2020-11-25 DIAGNOSIS — G4700 Insomnia, unspecified: Secondary | ICD-10-CM

## 2020-11-25 DIAGNOSIS — Z23 Encounter for immunization: Secondary | ICD-10-CM

## 2020-11-25 LAB — POCT GLYCOSYLATED HEMOGLOBIN (HGB A1C): HbA1c, POC (controlled diabetic range): 6.9 % (ref 0.0–7.0)

## 2020-11-25 MED ORDER — SHINGRIX 50 MCG/0.5ML IM SUSR: 0.5000 mL | INTRAMUSCULAR | 0 refills | Status: AC

## 2020-11-25 NOTE — Progress Notes (Signed)
    SUBJECTIVE:   CHIEF COMPLAINT / HPI:   Yvonne [name] is a pleasant 80 year old with history of aortic stenosis, bilateral carotid disease, insomnia, depression, vision changes hypertension and well-controlled type 2 diabetes presenting today for follow-up of her type 2 diabetes.  She has no specific concerns today.   The patient reports has had 1 fall since her last visit this was several weeks ago.  She was getting something out of a cupboard when she fell forward.  She did not hit her head.  She did not lose consciousness she had no symptoms prior to this.  She was able to get up on her own.  She has had no further falls since this time and otherwise feels well.  The patient reports no episodes of chest pain, syncope, dyspnea on exertion.  She denies polyuria or polydipsia.  She has been trying to watch her diet but does note she has been eating more ice cream.  She goes to [employer] several times a week.  She reports compliance with her medications which were reviewed today. PERTINENT  PMH / PSH/Family/Social History : Updated.   OBJECTIVE:   BP 130/70   Pulse 72   Ht 5\' 1"  (1.549 m)   Wt 169 lb 6.4 oz (76.8 kg)   SpO2 93%   BMI 32.01 kg/m   Today's weight:  Last Weight  Most recent update: 11/25/2020 10:42 AM    Weight  76.8 kg (169 lb 6.4 oz)            Review of prior weights: Filed Weights   11/25/20 1041  Weight: 169 lb 6.4 oz (76.8 kg)     Cardiac: Regular rate and rhythm.  There is a 3 out of 6 systolic murmur appreciated throughout the precordium and best at the right upper sternal border it does radiate to the carotids. Lungs are clear bilaterally No lower extremity edema   ASSESSMENT/PLAN:   Aortic stenosis Has had no episodes of chest pain, syncope, palpitations or dyspnea on exertion.  She has follow-up scheduled with her cardiologist for this.  Controlled type 2 diabetes mellitus with chronic kidney disease, without long-term current use of  insulin (HCC) A1c is again in diabetic range today.  We discussed dietary interventions and addition of medications.  She would like to focus on dietary changes at this time, consider referral to nutrition clinic in the future.  She is going to [employer] twice a week currently.j  INSOMNIA, PERSISTENT Discussed again my recommendation to limit amitriptyline given its side effects and her predisposition for falls.  Recommended melatonin at night and discussed dosing.  We discussed behavioral changes as well.    HCM Shingrix printed, COVID booster given, tolerated     Dorris Singh, MD  [place]

## 2020-11-25 NOTE — Assessment & Plan Note (Signed)
A1c is again in diabetic range today.  We discussed dietary interventions and addition of medications.  She would like to focus on dietary changes at this time, consider referral to nutrition clinic in the future.  She is going to [employer] twice a week currently.j

## 2020-11-25 NOTE — Assessment & Plan Note (Signed)
Discussed again my recommendation to limit amitriptyline given its side effects and her predisposition for falls.  Recommended melatonin at night and discussed dosing.  We discussed behavioral changes as well.

## 2020-11-25 NOTE — Patient Instructions (Addendum)
It was wonderful to see you today.  Please bring ALL of your medications with you to every visit.   Today we talked about: -Diabetes: - Your A1C is higher at 6.9--let's reduce ice cream and desserts - Add a protein to your snack  Sleep - Take only 1 amitriptyline   Try over the counter melatonin  - Start at 3 mg 30 minutes before bed  Then increase  to 6 mg  -- Take the prescription for the shingles shot to your pharmacy    -follow up in 3 months--my team will call you      Thank you for choosing [facility].   Please call [phone] with any questions about today's appointment.  Please be sure to schedule follow up at the front  desk before you leave today.   Dorris Singh, MD  Family Medicine

## 2020-11-25 NOTE — Assessment & Plan Note (Signed)
Has had no episodes of chest pain, syncope, palpitations or dyspnea on exertion.  She has follow-up scheduled with her cardiologist for this.

## 2020-12-07 DIAGNOSIS — R69 Illness, unspecified: Secondary | ICD-10-CM | POA: Diagnosis not present

## 2020-12-14 ENCOUNTER — Telehealth: Payer: Self-pay | Admitting: Family Medicine

## 2020-12-14 NOTE — Telephone Encounter (Signed)
Received refill request for trazodone. Attempted to call patient. IF calls back, please schedule visit to discuss (can be virtual).  Dorris Singh, MD  Family Medicine Teaching Service

## 2020-12-15 ENCOUNTER — Ambulatory Visit (INDEPENDENT_AMBULATORY_CARE_PROVIDER_SITE_OTHER): Payer: Medicare HMO | Admitting: Cardiovascular Disease

## 2020-12-15 ENCOUNTER — Ambulatory Visit (HOSPITAL_COMMUNITY)
Admission: EM | Admit: 2020-12-15 | Discharge: 2020-12-15 | Disposition: A | Discharge status: Home / Self Care | Payer: Medicare HMO | Attending: Urgent Care | Admitting: Urgent Care

## 2020-12-15 ENCOUNTER — Other Ambulatory Visit: Payer: Self-pay

## 2020-12-15 ENCOUNTER — Encounter: Payer: Self-pay | Admitting: Cardiovascular Disease

## 2020-12-15 ENCOUNTER — Encounter (HOSPITAL_COMMUNITY): Payer: Self-pay

## 2020-12-15 ENCOUNTER — Ambulatory Visit (INDEPENDENT_AMBULATORY_CARE_PROVIDER_SITE_OTHER): Payer: Medicare HMO

## 2020-12-15 VITALS — BP 124/70 | HR 91 | Ht 61.0 in | Wt 168.4 lb

## 2020-12-15 DIAGNOSIS — M7989 Other specified soft tissue disorders: Secondary | ICD-10-CM | POA: Diagnosis not present

## 2020-12-15 DIAGNOSIS — M2550 Pain in unspecified joint: Secondary | ICD-10-CM

## 2020-12-15 DIAGNOSIS — M25571 Pain in right ankle and joints of right foot: Secondary | ICD-10-CM

## 2020-12-15 DIAGNOSIS — M25471 Effusion, right ankle: Secondary | ICD-10-CM

## 2020-12-15 DIAGNOSIS — I350 Nonrheumatic aortic (valve) stenosis: Secondary | ICD-10-CM

## 2020-12-15 NOTE — ED Provider Notes (Signed)
[facility]   MRN: [id number] DOB: 1941/04/06  Subjective:   Yvonne [name] is a 80 y.o. female presenting for suffering 2 falls last night.  Patient states that she was wearing a different kind of shoe that have a lot more sole and she was not used to it.  She rolled her ankle and fell twice.  Denies head injury, loss of consciousness, weakness, numbness or tingling.  She does have a history of a stroke but does not endorse any similar symptoms as before despite her falls.  She has had increasing difficulty with right ankle swelling and pain, difficulty bearing weight and walking the way she normally would.  Has much milder knee pain bilaterally, hip pain bilaterally which she set up an office visit to discuss with her primary care provider later today.  No current facility-administered medications for this encounter.  Current Outpatient Medications:    amitriptyline (ELAVIL) 50 MG tablet, Take 1 tablet (50 mg total) by mouth at bedtime., Disp: 90 tablet, Rfl: 3   amLODipine (NORVASC) 5 MG tablet, Take 1 tablet (5 mg total) by mouth at bedtime., Disp: 90 tablet, Rfl: 3   aspirin 81 MG chewable tablet, Chew 1 tablet (81 mg total) by mouth daily., Disp: 90 tablet, Rfl: 3   atorvastatin (LIPITOR) 20 MG tablet, Take 1 tablet (20 mg total) by mouth daily., Disp: 90 tablet, Rfl: 3   b complex vitamins tablet, Take 1 tablet by mouth daily., Disp: , Rfl:    benazepril (LOTENSIN) 40 MG tablet, Take 1 tablet (40 mg total) by mouth daily., Disp: 90 tablet, Rfl: 3   gabapentin (NEURONTIN) 100 MG capsule, 1 tablet in the morning and 2 at night, Disp: 270 capsule, Rfl: 3   hydrochlorothiazide (HYDRODIURIL) 12.5 MG tablet, Take 1 tablet (12.5 mg total) by mouth daily., Disp: 90 tablet, Rfl: 3   levothyroxine (SYNTHROID) 88 MCG tablet, Take 1 tablet (88 mcg total) by mouth every morning. 30 minutes before food, Disp: 90 tablet, Rfl: 3   Multiple Vitamin (MULTIVITAMIN WITH MINERALS) TABS  tablet, Take 1 tablet by mouth daily with breakfast., Disp: , Rfl:    glucose blood (RELION TRUE METRIX TEST STRIPS) test strip, Use as instructed, Disp: 100 each, Rfl: 12   Zoster Vaccine Adjuvanted (SHINGRIX) injection, Inject 0.5 mLs into the muscle every 2 (two) months for 2 doses., Disp: 1 mL, Rfl: 0   No Known Allergies  Past Medical History:  Diagnosis Date   Anemia    Anxiety    Aortic stenosis    severe by 2021 echo    Arthritis    Bilateral carotid artery disease ([place]) 02/04/2017   Carotid US 3/22: R 1-39; L 100   Controlled type 2 diabetes mellitus with chronic kidney disease, without long-term current use of insulin ([place]) 11/27/2019   Depression, recurrent ([place]) 02/10/2019   Hypertension    Hypothyroidism    Impaired functional mobility, balance, gait, and endurance 09/02/2015   Mild cognitive impairment with memory loss 09/02/2015   Osteopenia    Stroke ([place])    pt was 40     Past Surgical History:  Procedure Laterality Date   ABDOMINAL HYSTERECTOMY     BRAIN SURGERY     age 89's   TOE AMPUTATION  2013   2nd toe on right foot, hammer toe   TONSILLECTOMY     TOTAL HIP ARTHROPLASTY Left 03/19/2017   Procedure: LEFT TOTAL HIP ARTHROPLASTY ANTERIOR APPROACH;  Surgeon: Jean Rosenthal  Y, MD;  Location: [facility];  Service: Orthopedics;  Laterality: Left;    Family History  Problem Relation Age of Onset   Thyroid disease Mother    Heart disease Father     Social History   Tobacco Use   Smoking status: Never   Smokeless tobacco: Never  Vaping Use   Vaping Use: Never used  Substance Use Topics   Alcohol use: Not Currently    Alcohol/week: 1.0 standard drink    Types: 1 Glasses of wine per week   Drug use: No    ROS   Objective:   Vitals: BP 136/73   Pulse 94   Temp 98.7 F (37.1 C)   Resp 18   SpO2 95%   Physical Exam Constitutional:      General: She is not in acute distress.    Appearance: Normal appearance. She is well-developed. She is  not ill-appearing.  HENT:     Head: Normocephalic and atraumatic.     Nose: Nose normal.     Mouth/Throat:     Mouth: Mucous membranes are moist.     Pharynx: Oropharynx is clear.  Eyes:     General: No scleral icterus.    Extraocular Movements: Extraocular movements intact.     Pupils: Pupils are equal, round, and reactive to light.  Cardiovascular:     Rate and Rhythm: Normal rate.  Pulmonary:     Effort: Pulmonary effort is normal.  Musculoskeletal:     Comments: Full range of motion for lower extremities including the hips.  No swelling, erythema or warmth, lacerations of the lower extremities.  Tenderness over the lateral malleolus of the right ankle with slight ecchymosis inferiorly.  Strength 5/5 of the lower extremities.  Skin:    General: Skin is warm and dry.  Neurological:     General: No focal deficit present.     Mental Status: She is alert and oriented to person, place, and time.  Psychiatric:        Mood and Affect: Mood normal.        Behavior: Behavior normal.    DG Ankle Complete Right  Result Date: 12/15/2020 CLINICAL DATA:  Fall.  Right ankle pain and swelling. EXAM: RIGHT ANKLE - COMPLETE 3+ VIEW COMPARISON:  12/26/2016. FINDINGS: Prominent soft tissue swelling noted over the lateral malleolus. No acute bony or joint abnormality. No evidence of fracture or dislocation. IMPRESSION: Prominent soft tissue swelling noted over the lateral malleolus. No acute bony abnormality identified. Electronically Signed   By: Marcello Moores  Register   On: 12/15/2020 13:50    Right ankle wrapped using 3" Ace wrap in figure-8 method.   Assessment and Plan :   PDMP not reviewed this encounter.  1. Acute right ankle pain   2. Multiple joint pain   3. Ankle swelling, right     Will manage for ankle sprain with rice method, Tylenol.  Follow-up with PCP regarding other multiple joint pains.  Counseled patient on potential for adverse effects with medications prescribed/recommended  today, ER and return-to-clinic precautions discussed, patient verbalized understanding.    Jaynee Eagles, [place] 12/15/20 [phone]

## 2020-12-15 NOTE — ED Notes (Signed)
Pt denies hitting head, denies being on blood thinners.

## 2020-12-15 NOTE — Progress Notes (Signed)
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 She was seen as a new consult in October 2021 for further discussion regarding her aortic stenosis. Her aortic stenosis has been moderate and has been followed by Dr. Acie Fredrickson for several years. She had a stroke at age 89 and is known to have a chronic left internal carotid artery occlusion. Last carotid dopplers in September 2020 with mild RICA stenosis and occluded LICA. She has had right sided arm and leg weakness since her stroke and has neuropathy in both feet. 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 Most recent echo 03/01/20 with LVEF=65-70%, moderate LVH. The aortic valve leaflets are thickened and calcified with mean gradient 33 mmHg, AVA 0.8 cm2 and dimensionless index of 0.23. SVI 38. The valve leaflets are restricted. The valve visually appears to have severe stenosis. 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 When I met her in October 2021, she denied chest pain, dyspnea, dizziness, lower extremity edema. No palpitations.She was seen by video visit in January 2022 due to a snow day. She denied any complaints that day, specifically denying any dyspnea at rest or with exertion. I saw her in February 2022 and she was doing well. Echo 09/14/20 with LVEF=65-70%, moderate LVH. Aortic stenosis with mean gradient 25 mmHg, peak gradient 39.33mHg, AVA 1.18 cm2, dimensionless index 0.41, SVI 54. This is more consistent with moderate aortic stenosis.   [place] has an  aide who works with her 2 hours per day in her home. 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 She has no active dental issues and sees a dentist regularly.   She is here today for follow up. The patient denies any chest pain, dyspnea, palpitations, lower extremity edema, orthopnea, PND, dizziness, near syncope or syncope. She tripped and fell at home yesterday and twisted her ankle.   Primary Care Physician: BMartyn Malay MD Primary Cardiologist: Nahser Referring Cardiologist: Nahser  Past Medical History:  Diagnosis Date   Anemia    Anxiety    Aortic stenosis    severe by 2021 echo    Arthritis    Bilateral carotid artery disease ([facility] 02/04/2017   Carotid [place]: R 1-39; L 100   Controlled type 2 diabetes mellitus with chronic kidney disease, without long-term current use of insulin ([facility] 11/27/2019   Depression, recurrent ([facility] 02/10/2019   Hypertension    Hypothyroidism    Impaired functional mobility, balance, gait, and endurance 09/02/2015   Mild cognitive impairment with memory loss 09/02/2015   Osteopenia    [place] ([place]    pt was 40    Past Surgical History:  Procedure Laterality Date   ABDOMINAL HYSTERECTOMY     BRAIN SURGERY     age 80's  TOE AMPUTATION  2013   2nd toe on right foot, hammer toe   TONSILLECTOMY     TOTAL HIP ARTHROPLASTY Left 03/19/2017   Procedure: LEFT TOTAL HIP ARTHROPLASTY ANTERIOR APPROACH;  Surgeon: BMcarthur Rossetti MD;  [facility] [facility]  Service: Orthopedics;  Laterality: Left;    Current Outpatient Medications  Medication Sig Dispense Refill   amitriptyline (ELAVIL) 50 MG tablet Take 1 tablet (50 mg total) by mouth at bedtime. 90 tablet 3   amLODipine (NORVASC) 5 MG tablet Take 1 tablet (5 mg total) by mouth at bedtime. 90 tablet 3   aspirin 81 MG chewable tablet Chew 1 tablet (81 mg total) by mouth daily. 90 tablet 3   atorvastatin (LIPITOR) 20 MG tablet Take 1 tablet (20 mg total) by mouth daily. 90 tablet 3   b  complex vitamins tablet Take 1 tablet by mouth daily.     benazepril (LOTENSIN) 40 MG tablet Take 1 tablet (40 mg total) by mouth daily. 90 tablet 3   gabapentin (NEURONTIN) 100 MG capsule 1 tablet in the morning and 2 at night 270 capsule 3   glucose blood (RELION TRUE METRIX TEST STRIPS) test strip Use as instructed 100 each 12   hydrochlorothiazide (HYDRODIURIL) 12.5 MG tablet Take 1 tablet (12.5 mg total) by mouth daily. 90 tablet 3   levothyroxine (SYNTHROID) 88 MCG tablet Take 1 tablet (88 mcg total) by mouth every morning. 30 minutes before food 90 tablet 3   Multiple Vitamin (MULTIVITAMIN WITH MINERALS) TABS tablet Take 1 tablet by mouth daily with breakfast.     Zoster Vaccine Adjuvanted [facility]) injection Inject 0.5 mLs into the muscle every 2 (two) months for 2 doses. 1 mL 0   No current facility-administered medications for this visit.    No Known Allergies  Social History   Socioeconomic History   Marital status: Widowed    Spouse name: Not on file   Number of children: 2   Years of education: masters   Highest education level: Not on file  Occupational History   Occupation: Primary school teacher: RETIRED  Tobacco Use   Smoking status: Never   Smokeless tobacco: Never  Vaping Use   Vaping Use: Never used  Substance and Sexual Activity   Alcohol use: Not Currently    Alcohol/week: 1.0 standard drink    Types: 1 Glasses of wine per week   Drug use: No   Sexual activity: Not Currently    Birth control/protection: Post-menopausal  Other Topics Concern   Not on file  Social History Narrative   Health Care POA:    Emergency Contact: son, Doreene Eland, (c) [phone]   End of Life Plan:    Who lives with you: lives alone now    Any pets: 2 cats, Sadie and Katie   Diet: Pt has a varied diet and tries to limit sweets.    Exercise: Pt walks on treadmill 10 minutes a day and uses bike 10 minutes a day.   Seatbelts: Pt reports wearing seatbelt when in vehicles.     Sun Exposure/Protection: Pt does not use sun protection.   Hobbies: reading, watching movies, writing      Husband passed away about 10 years ago    Social Determinants of Radio broadcast assistant Strain: Not on file  Food Insecurity: Not on file  Transportation Needs: Not on file  Physical Activity: Not on file  Stress: Not on file  Social Connections: Not on file  Intimate Partner Violence: Not on file    Family History  Problem Relation Age of Onset   Thyroid disease Mother    Heart disease Father     Review of Systems:  As stated in the HPI and otherwise negative.  BP 124/70   Pulse 91   Ht 5' 1" (1.549 m)   Wt 168 lb 6.4 oz (76.4 kg)   SpO2 93%   BMI 31.82 kg/m   Physical Examination: General: Well developed, well nourished, NAD  HEENT: OP clear, mucus membranes moist  SKIN: warm, dry. No rashes. Neuro: No focal deficits  Musculoskeletal: Muscle strength 5/5 all ext  Psychiatric: Mood and affect normal  Neck: No JVD, no carotid bruits, no thyromegaly, no lymphadenopathy.  Lungs:Clear bilaterally, no wheezes, rhonci, crackles Cardiovascular: Regular rate and rhythm. Loud harsh systolic murmur.  Abdomen:Soft. Bowel sounds present. Non-tender.  Extremities: No lower extremity edema. Pulses are 2 + in the bilateral DP/PT.   EKG:  EKG is not ordered today. The ekg ordered today demonstrates   Echo 09/14/20:   1. Mild intracavitary gradient. Peak velocity 0.79 m/s. Peak gradient 2.5  mmHg. Left ventricular ejection fraction, by estimation, is 65 to 70%. The  left ventricle has normal function. The left ventricle has no regional  wall motion abnormalities. There  is moderate concentric left ventricular hypertrophy. Left ventricular  diastolic parameters are consistent with Grade I diastolic dysfunction  (impaired relaxation). Elevated left ventricular end-diastolic pressure.  The average left ventricular global  longitudinal strain is -17.2 %. The global  longitudinal strain is normal.   2. Right ventricular systolic function is normal. The right ventricular  size is normal. There is normal pulmonary artery systolic pressure.   3. The mitral valve is normal in structure. Trivial mitral valve  regurgitation. No evidence of mitral stenosis.   4. The aortic valve is calcified. There is moderate calcification of the  aortic valve. There is moderate thickening of the aortic valve. Aortic  valve regurgitation is mild. Moderate aortic valve stenosis. Aortic  regurgitation PHT measures 531 msec.  Aortic valve area, by VTI measures 1.42 cm. Aortic valve mean gradient  measures 25.0 mmHg. Aortic valve Vmax measures 3.15 m/s.   5. Aortic dilatation noted. There is mild dilatation of the ascending  aorta, measuring 38 mm.   6. The inferior vena cava is normal in size with greater than 50%  respiratory variability, suggesting right atrial pressure of 3 mmHg.   FINDINGS   Left Ventricle: Mild intracavitary gradient. Peak velocity 0.79 m/s. Peak  gradient 2.5 mmHg. Left ventricular ejection fraction, by estimation, is  65 to 70%. The left ventricle has normal function. The left ventricle has  no regional wall motion  abnormalities. The average left ventricular global longitudinal strain is  -17.2 %. The global longitudinal strain is normal. The left ventricular  internal cavity size was normal in size. There is moderate concentric left  ventricular hypertrophy. Left  ventricular diastolic parameters are consistent with Grade I diastolic  dysfunction (impaired relaxation). Elevated left ventricular end-diastolic  pressure.   Right Ventricle: The right ventricular size is normal. No increase in  right ventricular wall thickness. Right ventricular systolic function is  normal. There is normal pulmonary artery systolic pressure. The tricuspid  regurgitant velocity is 2.10 m/s, and   with an assumed right atrial pressure of 3 mmHg, the estimated right   ventricular systolic pressure is 36.1 mmHg.   Left Atrium: Left atrial size was normal in size.   Right Atrium: Right atrial size was normal in size.   Pericardium: There is no evidence of pericardial effusion.   Mitral Valve: The mitral valve is normal in structure. Trivial mitral  valve regurgitation. No evidence of mitral valve stenosis.   Tricuspid  Valve: The tricuspid valve is normal in structure. Tricuspid  valve regurgitation is trivial. No evidence of tricuspid stenosis.   Aortic Valve: The aortic valve is calcified. There is moderate  calcification of the aortic valve. There is moderate thickening of the  aortic valve. Aortic valve regurgitation is mild. Aortic regurgitation PHT  measures 531 msec. Moderate aortic stenosis  is present. Aortic valve mean gradient measures 25.0 mmHg. Aortic valve  peak gradient measures 39.7 mmHg. Aortic valve area, by VTI measures 1.42  cm.   Pulmonic Valve: The pulmonic valve was normal in structure. Pulmonic valve  regurgitation is not visualized. No evidence of pulmonic stenosis.   Aorta: Aortic dilatation noted. There is mild dilatation of the ascending  aorta, measuring 38 mm.   Venous: The inferior vena cava is normal in size with greater than 50%  respiratory variability, suggesting right atrial pressure of 3 mmHg.   IAS/Shunts: No atrial level shunt detected by color flow Doppler.      LEFT VENTRICLE  PLAX 2D  LVIDd:         3.80 cm  Diastology  LVIDs:         2.70 cm  LV e' medial:    4.24 cm/s  LV PW:         1.40 cm  LV E/e' medial:  17.9  LV IVS:        1.50 cm  LV e' lateral:   4.03 cm/s  LVOT diam:     2.10 cm  LV E/e' lateral: 18.9  LV SV:         95  LV SV Index:   54       2D Longitudinal Strain  LVOT Area:     3.46 cm 2D Strain GLS (A2C):   -12.7 %                          2D Strain GLS (A3C):   -20.9 %                          2D Strain GLS (A4C):   -18.0 %                          2D Strain GLS Avg:      -17.2 %   RIGHT VENTRICLE  RV Basal diam:  2.60 cm  RV S prime:     16.00 cm/s  TAPSE (M-mode): 1.6 cm  RVSP:           20.6 mmHg   LEFT ATRIUM             Index       RIGHT ATRIUM           Index  LA diam:        4.00 cm 2.28 cm/m  RA Pressure: 3.00 mmHg  LA Vol (A2C):   43.5 ml 24.78 ml/m RA Area:     14.30 cm  LA Vol (A4C):   31.8 ml 18.11 ml/m RA Volume:   40.30 ml  22.95 ml/m  LA Biplane Vol: 39.7 ml 22.61 ml/m   AORTIC VALVE  AV Area (Vmax):    1.18 cm  AV Area (Vmean):   1.25 cm  AV Area (VTI):     1.42 cm  AV Vmax:           315.00 cm/s  AV Vmean:  218.000 cm/s  AV VTI:            0.664 m  AV Peak Grad:      39.7 mmHg  AV Mean Grad:      25.0 mmHg  LVOT Vmax:         107.00 cm/s  LVOT Vmean:        78.700 cm/s  LVOT VTI:          0.273 m  LVOT/AV VTI ratio: 0.41  AI PHT:            531 msec     AORTA  Ao Root diam: 3.10 cm  Ao Asc diam:  3.80 cm   MITRAL VALVE                TRICUSPID VALVE  MV Area (PHT):              TR Peak grad:   17.6 mmHg  MV Decel Time:              TR Vmax:        210.00 cm/s  MV E velocity: 76.00 cm/s   Estimated RAP:  3.00 mmHg  MV A velocity: 152.00 cm/s  RVSP:           20.6 mmHg  MV E/A ratio:  0.50                              SHUNTS                              Systemic VTI:  0.27 m                              Systemic Diam: 2.10 cm    Recent Labs: 09/02/2020: BUN 14; Creatinine, Ser 0.74; Hemoglobin 14.3; Platelets 347; Potassium 3.9; Sodium 143 10/20/2020: TSH 3.230    Wt Readings from Last 3 Encounters:  12/15/20 168 lb 6.4 oz (76.4 kg)  11/25/20 169 lb 6.4 oz (76.8 kg)  09/02/20 168 lb 6.4 oz (76.4 kg)     STS Risk Score: Risk of Mortality: 2.113% Renal Failure: 1.226% Permanent Stroke: 3.797% Prolonged Ventilation: 7.839% DSW Infection: 0.068% Reoperation: 2.659% Morbidity or Mortality: 10.821% Short Length of Stay: 33.710% Long Length of Stay: 4.686   Assessment and Plan:   1.  Severe Aortic Valve Stenosis: She likely has paradoxical low flow/low gradient aortic stenosis but most recent echo only suggests her aortic stenosis is moderate. Based on prior echo findings I suspect her AS is bordering on severe but she continues to have no symptoms.  I have reviewed the echo images. The valve leaflets are thickened and calcified with limited excursion and visually appears to have severe stenosis. While she is probably not a candidate for conventional surgical AVR due to advanced age, she will be a candidate for TAVR when she becomes symptomatic.   I have reviewed the natural history of aortic stenosis with the patient and their family members  who are present today. We have discussed the limitations of medical therapy and the poor prognosis associated with symptomatic aortic stenosis. We have reviewed potential treatment options, including palliative medical therapy, conventional surgical aortic valve replacement, and transcatheter aortic valve replacement. We discussed treatment options in the context of the patient's specific comorbid medical conditions.  Will repeat echo in October 2022. She will call with change in her symptoms.   Current medicines are reviewed at length with the patient today.  The patient does not have concerns regarding medicines.  The following changes have been made:  no change  Labs/ tests ordered today include:   Orders Placed This Encounter  Procedures   ECHOCARDIOGRAM COMPLETE      Disposition:   FU with  in 3-4 months   Signed, Lauree Chandler, MD 12/15/2020 12:36 PM    [address], [place], [place]  [address] Phone: [phone]; Fax: [phone]

## 2020-12-15 NOTE — ED Notes (Signed)
Escorted patient to the car by wheelchair

## 2020-12-15 NOTE — Patient Instructions (Signed)
Medication Instructions:  Your physician recommends that you continue on your current medications as directed. Please refer to the Current Medication list given to you today.  *If you need a refill on your cardiac medications before your next appointment, please call your pharmacy*   Lab Work: None Ordered If you have labs (blood work) drawn today and your tests are completely normal, you will receive your results only by: [place] (if you have MyChart) OR A paper copy in the mail If you have any lab test that is abnormal or we need to change your treatment, we will call you to review the results.   Testing/Procedures: Your physician has requested that you have an echocardiogram in October 2022. Echocardiography is a painless test that uses sound waves to create images of your heart. It provides your doctor with information about the size and shape of your heart and how well your heart's chambers and valves are working. This procedure takes approximately one hour. There are no restrictions for this procedure.    Follow-Up: At [facility], you and your health needs are our priority.  As part of our continuing mission to provide you with exceptional heart care, we have created designated Provider Care Teams.  These Care Teams include your primary Cardiologist (physician) and Advanced Practice Providers (APPs -  Physician Assistants and Nurse Practitioners) who all work together to provide you with the care you need, when you need it.  We recommend signing up for the patient portal called "MyChart".  Sign up information is provided on this After Visit Summary.  MyChart is used to connect with patients for Virtual Visits (Telemedicine).  Patients are able to view lab/test results, encounter notes, upcoming appointments, etc.  Non-urgent messages can be sent to your provider as well.   To learn more about what you can do with MyChart, go to [email or web address].    Your next  appointment:   5 month(s)  The format for your next appointment:   In Person  Provider:   Lauree Chandler, MD

## 2020-12-15 NOTE — ED Triage Notes (Signed)
Pt reports unwitnessed fall twice last night after tripping in shoes with small heel. Reports twisting ankle and falling to floor. Pt reports did not hit head and reports only pain is in right ankle. Caretaker unsure if pt's account of fall or pain is accurate.

## 2020-12-15 NOTE — Discharge Instructions (Addendum)
Please just use Tylenol at a dose of 500mg-650mg once every 6 hours as needed for your aches, pains, fevers. Do not use any nonsteroidal anti-inflammatories (NSAIDs) like ibuprofen, Motrin, naproxen, Aleve, etc. which are all available over-the-counter.   

## 2020-12-20 NOTE — Progress Notes (Signed)
Patient in office today for routine diabetic shoe follow-up. Patient seen by EJ with [facility] who answered and addressed  all of the patient's questions, comments, and concerns at this time. Patient voiced satisfaction with the visit.

## 2020-12-28 DIAGNOSIS — H6123 Impacted cerumen, bilateral: Secondary | ICD-10-CM | POA: Diagnosis not present

## 2021-01-30 ENCOUNTER — Telehealth: Payer: Self-pay | Admitting: Podiatry

## 2021-01-30 NOTE — Telephone Encounter (Signed)
Pt left message stating she has questions about diabetic shoes.   I returned the call and she stated she was going to talk to her aide about it and they would get back to me.

## 2021-02-06 ENCOUNTER — Ambulatory Visit: Payer: Medicare HMO | Admitting: Family Medicine

## 2021-02-17 ENCOUNTER — Other Ambulatory Visit: Payer: Self-pay

## 2021-02-17 ENCOUNTER — Telehealth: Payer: Self-pay

## 2021-02-17 ENCOUNTER — Encounter: Payer: Self-pay | Admitting: Family Medicine

## 2021-02-17 ENCOUNTER — Other Ambulatory Visit: Payer: Self-pay | Admitting: Family Medicine

## 2021-02-17 ENCOUNTER — Ambulatory Visit (INDEPENDENT_AMBULATORY_CARE_PROVIDER_SITE_OTHER): Payer: Medicare HMO | Admitting: Family Medicine

## 2021-02-17 ENCOUNTER — Telehealth: Payer: Self-pay | Admitting: Family Medicine

## 2021-02-17 VITALS — BP 125/80 | HR 82 | Ht 62.0 in | Wt 165.6 lb

## 2021-02-17 DIAGNOSIS — H903 Sensorineural hearing loss, bilateral: Secondary | ICD-10-CM | POA: Diagnosis not present

## 2021-02-17 DIAGNOSIS — R2681 Unsteadiness on feet: Secondary | ICD-10-CM | POA: Insufficient documentation

## 2021-02-17 DIAGNOSIS — N63 Unspecified lump in unspecified breast: Secondary | ICD-10-CM

## 2021-02-17 DIAGNOSIS — M1612 Unilateral primary osteoarthritis, left hip: Secondary | ICD-10-CM | POA: Diagnosis not present

## 2021-02-17 DIAGNOSIS — N6320 Unspecified lump in the left breast, unspecified quadrant: Secondary | ICD-10-CM

## 2021-02-17 DIAGNOSIS — I350 Nonrheumatic aortic (valve) stenosis: Secondary | ICD-10-CM | POA: Diagnosis not present

## 2021-02-17 DIAGNOSIS — E1122 Type 2 diabetes mellitus with diabetic chronic kidney disease: Secondary | ICD-10-CM

## 2021-02-17 LAB — POCT GLYCOSYLATED HEMOGLOBIN (HGB A1C): HbA1c, POC (controlled diabetic range): 6.9 % (ref 0.0–7.0)

## 2021-02-17 NOTE — Telephone Encounter (Signed)
See below message from [employer]  Received! Thank you!   Talbot Grumbling, RN

## 2021-02-17 NOTE — Telephone Encounter (Signed)
Called patient and LVM informing her of appointment.  I will call patient and make sure that she received the voicemail as she is hard of hearing.  03/08/2021 1300 Diagnostic mammogram  [facility]  .Ozella Almond, CMA

## 2021-02-17 NOTE — Patient Instructions (Addendum)
It was wonderful to see you today.  Please bring ALL of your medications with you to every visit.   Today we talked about: -Going to get a mammogram--I will call the breast center  - I sent in a bedside toilet-- try to use this at night  - Your A1C was GREAT TODAY   Follow up in 6 months    Thank you for choosing [facility].   Please call [phone] with any questions about today's appointment.  Please be sure to schedule follow up at the front  desk before you leave today.   Dorris Singh, MD  Family Medicine

## 2021-02-17 NOTE — Telephone Encounter (Signed)
Community message sent to [employer] for [facility]. Will await response.   Talbot Grumbling, RN

## 2021-02-17 NOTE — Progress Notes (Signed)
    SUBJECTIVE:   CHIEF COMPLAINT: A1C, breast lump HPI:   Yvonne [name] is a 80 y.o. yo with history notable for moderate to severe aortic stenosis, well-controlled type 2 diabetes and hypertension presenting for A1c and a breast lump.  The patient is joined by her helper and caregiver Andie.  She reports overall she is doing well.  She is not had any falls at home.  She is getting up to the bathroom at night frequently and feels unsteady on her feet at that time.  She is not particular interested in a bedside commode but is amenable to 1.  Her caregiver comes several times a week in the morning for several hours.  The patient is ambulatory without an assistive device.  She is able to bathe, cook, clean and small amounts and care for herself.  She is hard of hearing.  The patient reports about 2 weeks ago she felt a left breast lump. NO pain or discharge. No trauma. Marland Kitchen   PERTINENT  PMH / PSH/Family/Social History : Updated and reviewed   OBJECTIVE:   BP 125/80   Pulse 82   Ht '5\' 2"'$  (1.575 m)   Wt 165 lb 9.6 oz (75.1 kg)   SpO2 94%   BMI 30.29 kg/m   Today's weight:  Last Weight  Most recent update: 02/17/2021 11:23 AM    Weight  75.1 kg (165 lb 9.6 oz)            Review of prior weights: [employer]   02/17/21 1122  Weight: 165 lb 9.6 oz (75.1 kg)   Breasts: right  breast appears normal, no suspicious masses, no skin or nipple changes or axillary nodes. Left breast normal in appearance, but at 7 o clock position has 2 cm firm non tender mobile mass  No LAD  Cardiac: Regular rate and rhythm. Normal S1/S2. 3/6 late peaking systolic with radiation to bilateral carotids  Lungs: Clear bilaterally to ascultation.  Abdomen: Normoactive bowel sounds. No tenderness to deep or light palpation. No rebound or guarding.   Psych: Pleasant and appropriate  Alert, oriented and knows medications    ASSESSMENT/PLAN:   Aortic stenosis Asymptomatic--No chest pain, dizziness, or  dyspnea. Discussed echo upcoming, caregiver and patient aware of time/date.    Controlled type 2 diabetes mellitus with chronic kidney disease, without long-term current use of insulin (HCC) Stable A1C today, congratulated. Already on a statin. Repeat in 6 months given stability and age.     Breast mass, suspicious for malignancy vs. Fibrous tissue. Called [facility]. Rescheduled for 9/21 at 1 oclock PM   Gait instability, due to OA, question of component of spinal stenosis, no falls, no radicular symptoms, Bedside commode ordered. Discussed PT at follow up  HCM Due for FLU and COVID at follow up  ACP is on file (21)     Dorris Singh, MD  [place]

## 2021-02-17 NOTE — Assessment & Plan Note (Signed)
Asymptomatic--No chest pain, dizziness, or dyspnea. Discussed echo upcoming, caregiver and patient aware of time/date.

## 2021-02-17 NOTE — Telephone Encounter (Signed)
Attempted to call patient twice about her upcoming mammogram.  I have rescheduled her mammogram for September 21 at 1:00 at [facility].  Nursing can you please try to reach out to patient next week to discuss this change in her appointment with her for her new breast lump.

## 2021-02-17 NOTE — Assessment & Plan Note (Signed)
Stable A1C today, congratulated. Already on a statin. Repeat in 6 months given stability and age.

## 2021-02-23 ENCOUNTER — Other Ambulatory Visit: Payer: Self-pay | Admitting: *Deleted

## 2021-02-23 DIAGNOSIS — F339 Major depressive disorder, recurrent, unspecified: Secondary | ICD-10-CM

## 2021-02-23 MED ORDER — AMITRIPTYLINE HCL 50 MG PO TABS
50.0000 mg | ORAL_TABLET | Freq: Every day | ORAL | 3 refills | Status: DC
Start: 2021-02-23 — End: 2022-04-05

## 2021-03-01 ENCOUNTER — Ambulatory Visit (INDEPENDENT_AMBULATORY_CARE_PROVIDER_SITE_OTHER): Payer: Medicare HMO

## 2021-03-01 ENCOUNTER — Ambulatory Visit (INDEPENDENT_AMBULATORY_CARE_PROVIDER_SITE_OTHER): Payer: Medicare HMO | Admitting: Podiatry

## 2021-03-01 ENCOUNTER — Other Ambulatory Visit: Payer: Self-pay

## 2021-03-01 DIAGNOSIS — E0843 Diabetes mellitus due to underlying condition with diabetic autonomic (poly)neuropathy: Secondary | ICD-10-CM

## 2021-03-01 DIAGNOSIS — S98131D Complete traumatic amputation of one right lesser toe, subsequent encounter: Secondary | ICD-10-CM

## 2021-03-01 DIAGNOSIS — E119 Type 2 diabetes mellitus without complications: Secondary | ICD-10-CM | POA: Diagnosis not present

## 2021-03-01 NOTE — Progress Notes (Signed)
Patient in office today to be measured for diabetic shoes and custom inserts. Patient was measured at a size 7.5 women's regular fit. Provider treating the patient's diabetes is Dr. Dorris Singh. Patient selected shoe Miranda A350W [employer] as the 1st choice and shoe Karle Starch B600W [employer] as the 2nd option. Patient advised the office will call when shoes are ready for pick-up.

## 2021-03-01 NOTE — Progress Notes (Signed)
   HPI: 80 y.o. female PMHx DM type II with peripheral polyneuropathy presenting today as a new patient for a routine foot exam to the bilateral lower extremities.  Patient states that she has numbness and tingling to the bilateral lower extremities and has been diagnosed with diabetic neuropathy.  Patient states that she does not experience pain only constant numbness.  She presents for further treatment and evaluation  Past Medical History:  Diagnosis Date   Anemia    Anxiety    Aortic stenosis    severe by 2021 echo    Arthritis    Bilateral carotid artery disease ([place]) 02/04/2017   Carotid US 3/22: R 1-39; L 100   Controlled type 2 diabetes mellitus with chronic kidney disease, without long-term current use of insulin ([place]) 11/27/2019   Depression, recurrent ([place]) 02/10/2019   Hypertension    Hypothyroidism    Impaired functional mobility, balance, gait, and endurance 09/02/2015   Mild cognitive impairment with memory loss 09/02/2015   Osteopenia    Stroke ([place])    pt was 40     Physical Exam: General: The patient is alert and oriented x3 in no acute distress.  Dermatology: Skin is warm, dry and supple bilateral lower extremities. Negative for open lesions or macerations.  Vascular: Palpable pedal pulses bilaterally. No edema or erythema noted. Capillary refill within normal limits.  Neurological: Epicritic and protective threshold absent bilaterally.   Musculoskeletal Exam: Range of motion within normal limits to all pedal and ankle joints bilateral. Muscle strength 5/5 in all groups bilateral.  History of right second toe amputation   Assessment: 1.  Diabetes mellitus with peripheral polyneuropathy 2.  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 Patient evaluated.  Comprehensive diabetic foot exam performed today 2.  Appointment with Pedorthist for custom molded insoles and diabetic shoes 3.  Recommend increasing the patient's  gabapentin from 100 mg 2 times daily to 300 mg 2 times daily 4.  Today the patient is going to be molded for custom molded diabetic insoles and shoes  5.  Return to clinic annually      Edrick Kins, DPM [facility]  Dr. Edrick Kins, DPM    [address], [place] [address]                Office [phone]  Fax [phone]

## 2021-03-08 ENCOUNTER — Other Ambulatory Visit: Payer: Medicare HMO

## 2021-03-13 ENCOUNTER — Telehealth: Payer: Self-pay | Admitting: Family Medicine

## 2021-03-13 NOTE — Telephone Encounter (Signed)
Received note that patient would like to increase her gabapentin to 300 mg in the morning and evening.  Called to discuss would prefer to do slow taper up starting at 200 qAM and 200 qPM.  Left a generic voicemail for her to call back to discuss or schedule a visit.  Dorris Singh, MD  Family Medicine Teaching Service

## 2021-03-28 ENCOUNTER — Ambulatory Visit (HOSPITAL_COMMUNITY): Payer: Medicare HMO | Attending: Cardiovascular Disease

## 2021-03-28 ENCOUNTER — Other Ambulatory Visit: Payer: Self-pay

## 2021-03-28 DIAGNOSIS — I350 Nonrheumatic aortic (valve) stenosis: Secondary | ICD-10-CM | POA: Diagnosis not present

## 2021-03-28 LAB — ECHOCARDIOGRAM COMPLETE
AR max vel: 1.06 cm2
AV Area VTI: 1.15 cm2
AV Area mean vel: 1.08 cm2
AV Mean grad: 28 mmHg
AV Peak grad: 50.4 mmHg
Ao pk vel: 3.55 m/s
Area-P 1/2: 3.27 cm2
MV VTI: 2.14 cm2
P 1/2 time: 446 msec
S' Lateral: 2.1 cm

## 2021-03-30 ENCOUNTER — Telehealth: Payer: Self-pay

## 2021-03-30 NOTE — Telephone Encounter (Signed)
The patient called the Structural Heart office to report she is ready to proceed with surgery. She states her systems have progressed. She is more fatigued than usual and would like to see Dr. Angelena Form. Rescheduled the patient to see Dr. Angelena Form 04/10/2021. She was grateful for call and agrees with plan.

## 2021-04-05 DIAGNOSIS — N6311 Unspecified lump in the right breast, upper outer quadrant: Secondary | ICD-10-CM | POA: Diagnosis not present

## 2021-04-05 DIAGNOSIS — N6324 Unspecified lump in the left breast, lower inner quadrant: Secondary | ICD-10-CM | POA: Diagnosis not present

## 2021-04-05 DIAGNOSIS — R928 Other abnormal and inconclusive findings on diagnostic imaging of breast: Secondary | ICD-10-CM | POA: Diagnosis not present

## 2021-04-05 DIAGNOSIS — N6323 Unspecified lump in the left breast, lower outer quadrant: Secondary | ICD-10-CM | POA: Diagnosis not present

## 2021-04-05 DIAGNOSIS — N6312 Unspecified lump in the right breast, upper inner quadrant: Secondary | ICD-10-CM | POA: Diagnosis not present

## 2021-04-07 ENCOUNTER — Telehealth: Payer: Self-pay | Admitting: Podiatry

## 2021-04-07 NOTE — Telephone Encounter (Signed)
Diabetic shoes and inserts in.. lvm for pt to call to schedule an appt to pick them up.

## 2021-04-10 ENCOUNTER — Ambulatory Visit: Payer: Medicare HMO | Admitting: Cardiovascular Disease

## 2021-04-11 ENCOUNTER — Telehealth: Payer: Self-pay | Admitting: Podiatry

## 2021-04-11 NOTE — Telephone Encounter (Signed)
Pts caregiver left message stating she was returning a call about picking up pts diabetic shoes and they will be here Thursday 10.27 between 11 and 12.  I returned call and left message that I put pt on the schedule for 1130 on 10.27 as they said and to call if any questions.

## 2021-04-13 ENCOUNTER — Other Ambulatory Visit: Payer: Medicare HMO

## 2021-04-13 ENCOUNTER — Other Ambulatory Visit: Payer: Self-pay

## 2021-04-13 ENCOUNTER — Encounter: Payer: Self-pay | Admitting: Family Medicine

## 2021-04-13 DIAGNOSIS — Z89421 Acquired absence of other right toe(s): Secondary | ICD-10-CM | POA: Diagnosis not present

## 2021-04-13 DIAGNOSIS — E1342 Other specified diabetes mellitus with diabetic polyneuropathy: Secondary | ICD-10-CM | POA: Diagnosis not present

## 2021-04-13 DIAGNOSIS — E1140 Type 2 diabetes mellitus with diabetic neuropathy, unspecified: Secondary | ICD-10-CM | POA: Diagnosis not present

## 2021-04-19 DIAGNOSIS — C50512 Malignant neoplasm of lower-outer quadrant of left female breast: Secondary | ICD-10-CM | POA: Diagnosis not present

## 2021-04-19 DIAGNOSIS — C50912 Malignant neoplasm of unspecified site of left female breast: Secondary | ICD-10-CM | POA: Diagnosis not present

## 2021-04-21 ENCOUNTER — Encounter: Payer: Self-pay | Admitting: Family Medicine

## 2021-04-24 ENCOUNTER — Telehealth: Payer: Self-pay | Admitting: Hematology

## 2021-04-24 ENCOUNTER — Encounter: Payer: Self-pay | Admitting: *Deleted

## 2021-04-24 DIAGNOSIS — C50512 Malignant neoplasm of lower-outer quadrant of left female breast: Secondary | ICD-10-CM

## 2021-04-24 HISTORY — DX: Estrogen receptor positive status (ER+): C50.512

## 2021-04-24 NOTE — Telephone Encounter (Signed)
Spoke to patient to confirm morning clinic appointment for 11/9

## 2021-04-25 NOTE — Progress Notes (Signed)
Radiation Oncology         [phone] ________________________________  Initial Outpatient Consultation  Name: Yvonne [name] MRN: [id number]  Date: 04/26/2021  DOB: 03-Mar-1941  NL:[id number], Raiford Simmonds, MD  Erroll Luna, MD   REFERRING PHYSICIAN: Erroll Luna, MD  DIAGNOSIS:    ICD-10-CM   1. 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 female who presented with breast abnormality on the following imaging: bilateral diagnostic mammogram at [facility] on the date of 04/05/21 which revealed an indeterminate, 7 x 7 x 3 cm irregular focal asymmetry in the left breast.  Symptoms, if any, at that time, were: a palpable left breast lump. Patient's PCP did a breast exam one month prior and found a lump in the right breast. No abnormality was seen within the right breast   Bilateral breast ultrasound on 04/05/21 further revealed the irregular mass in the left breast as highly suggestive of malignancy.   Ultrasound of the left axilla was negative.  Left breast biopsy of the mass on date of 04/19/21 showed invasive mammary carcinoma measuring 1.6 cm in the greatest linear dimension, and mammary carcinoma in-situ.  ER status: 100% positive; PR status 100% positive, both with strong staining intensity; Her2 status negative;  Grade 1-2.  She is here with a caregiver.  She is hard of hearing.  PREVIOUS RADIATION THERAPY: No  PAST MEDICAL HISTORY:  has a past medical history of Anemia, Anxiety, Aortic stenosis, Arthritis, Bilateral carotid artery disease ([place]) (02/04/2017), Controlled type 2 diabetes mellitus with chronic kidney disease, without long-term current use of insulin ([place]) (11/27/2019), Depression, recurrent ([place]) (02/10/2019), Hypertension, Hypothyroidism, Impaired functional mobility, balance, gait, and endurance (09/02/2015), Malignant neoplasm of lower-outer quadrant of left breast of female, estrogen receptor positive ([employer]) (04/24/2021), Mild cognitive impairment with memory loss (09/02/2015), Osteopenia, and Stroke ([place]).    PAST SURGICAL HISTORY: Past Surgical History:  Procedure Laterality Date   ABDOMINAL HYSTERECTOMY     BRAIN SURGERY     age 53's   TOE AMPUTATION  2013   2nd toe on right foot, hammer toe   TONSILLECTOMY     TOTAL HIP ARTHROPLASTY Left 03/19/2017   Procedure: LEFT TOTAL HIP ARTHROPLASTY ANTERIOR APPROACH;  Surgeon: Mcarthur Rossetti, MD;  Location: [place];  Service: Orthopedics;  Laterality: Left;    FAMILY HISTORY: family history includes Heart disease in her father; Thyroid disease in her mother.  SOCIAL HISTORY:  reports that she has never smoked. She has never used smokeless tobacco. She reports that she does not currently use alcohol after a past usage of about 1.0 standard drink per week. She reports that she does not use drugs.  ALLERGIES: Patient has no known allergies.  MEDICATIONS:  Current Outpatient Medications  Medication Sig Dispense Refill   amitriptyline (ELAVIL) 50 MG tablet Take 1 tablet (  50 mg total) by mouth at bedtime. 90 tablet 3   amLODipine (NORVASC) 5 MG tablet Take 1 tablet (5 mg total) by mouth at bedtime. 90 tablet 3   aspirin 81 MG chewable tablet Chew 1 tablet (81 mg total) by mouth daily. (Patient taking differently: Chew 81 mg by mouth  daily. At bedtime) 90 tablet 3   atorvastatin (LIPITOR) 20 MG tablet Take 1 tablet (20 mg total) by mouth daily. 90 tablet 3   benazepril (LOTENSIN) 40 MG tablet Take 1 tablet (40 mg total) by mouth daily. 90 tablet 3   gabapentin (NEURONTIN) 100 MG capsule 1 tablet in the morning and 2 at night (Patient taking differently: Take 400 mg by mouth 2 (two) times daily. 2 tablet in the morning and 2 at night) 270 capsule 3   glucose blood (RELION TRUE METRIX TEST STRIPS) test strip Use as instructed 100 each 12   hydrochlorothiazide (HYDRODIURIL) 12.5 MG tablet Take 1 tablet (12.5 mg total) by mouth daily. 90 tablet 3   levothyroxine (SYNTHROID) 88 MCG tablet Take 1 tablet (88 mcg total) by mouth every morning. 30 minutes before food 90 tablet 3   Misc Natural Products (OSTEO BI-FLEX ADV TRIPLE ST) TABS Take 2 tablets by mouth daily. 78m of Vitamin C  (Ascorbic Acid); 263mManganese (Manganese Sulfate); 3559modium; 1500m50mucosamine HCI; 100mg25moxin (Boswellia Serrata Extract resin); 1103mg 9mndroitin/MSM Complex (Chrondroitin Sulfate, Methylsulfonylmethane, Collagen (Hydrolyzed Gelatin), Boswellia Serrata (resin); Boron (Bororganic Glycine); Hyaluronic Acid     Multiple Vitamins-Minerals (CENTRUM SILVER ADULT 50+) TABS Take 1 tablet by mouth daily.     No current facility-administered medications for this encounter.    REVIEW OF SYSTEMS: As above in HPI.   PHYSICAL EXAM:  vitals were not taken for this visit.   General: Alert and oriented, in no acute distress HEENT: She is sniffling and coughing MSK: She needs some help getting up on the examination table Breasts: The left breast is notable for a large mass that is over 5 cm in the posterior lower outer quadrant. No other palpable masses appreciated in the breasts or axillae bilaterally.   ECOG = 2  0 - Asymptomatic (Fully active, able to carry on all predisease activities without restriction)  1 - Symptomatic but completely ambulatory  (Restricted in physically strenuous activity but ambulatory and able to carry out work of a light or sedentary nature. For example, light housework, office work)  2 - Symptomatic, <50% in bed during the day (Ambulatory and capable of all self care but unable to carry out any work activities. Up and about more than 50% of waking hours)  3 - Symptomatic, >50% in bed, but not bedbound (Capable of only limited self-care, confined to bed or chair 50% or more of waking hours)  4 - Bedbound (Completely disabled. Cannot carry on any self-care. Totally confined to bed or chair)  5 - Death   [place] MEustace Penreech RH, Tormey DC, et al. ([phone] and response criteria of the [facility]". Am. J.[place]. 5 (6): 649-55   LABORATORY DATA:  Lab Results  Component Value Date   WBC 9.7 04/26/2021   HGB 13.5 04/26/2021   HCT 40.3 04/26/2021   MCV 87.4 04/26/2021   PLT 359 04/26/2021   CMP     Component Value Date/Time   NA 136 04/26/2021 0824   NA 143 09/02/2020 1044   K 3.8 04/26/2021 0824   CL 101 04/26/2021 0824   CO2 23  04/26/2021 0824   GLUCOSE 251 (H) 04/26/2021 0824   BUN 15 04/26/2021 0824   BUN 14 09/02/2020 1044   CREATININE 0.84 04/26/2021 0824   CREATININE 0.83 07/25/2016 1155   CALCIUM 8.9 04/26/2021 0824   PROT 7.4 04/26/2021 0824   PROT 6.9 07/31/2019 0756   ALBUMIN 3.6 04/26/2021 0824   ALBUMIN 4.2 07/31/2019 0756   AST 25 04/26/2021 0824   ALT 25 04/26/2021 0824   ALKPHOS 86 04/26/2021 0824   BILITOT 0.3 04/26/2021 0824   GFRNONAA >60 04/26/2021 0824   GFRAA 72 07/31/2019 0756         RADIOGRAPHY: ECHOCARDIOGRAM COMPLETE  Result Date: 03/28/2021    ECHOCARDIOGRAM REPORT   Patient Name:   RENI HAUSNER Date of Exam: 03/28/2021 Medical Rec #:  [id number]      Height:       62.0 in Accession #:    [id number]     Weight:       165.6 lb Date of Birth:  1940/07/29     BSA:          1.764 m Patient Age:    60 years       BP:           125/80  mmHg Patient Gender: F              HR:           81 bpm. Exam Location:  [place] Procedure: 2D Echo, Cardiac Doppler, Color Doppler and Strain Analysis Indications:    I35 Aortic stenosis  History:        Patient has prior history of Echocardiogram examinations, most                 recent 09/14/2020. Aortic Valve Disease; Risk                 Factors:Hypertension and Diabetes. CKD.  Sonographer:    Basilia Jumbo BS, RDCS Referring Phys: [place]  1. The aortic valve is tricuspid. There is severe calcifcation of the aortic valve. There is severe thickening of the aortic valve. Aortic valve regurgitation is mild. Aortic valve area, by VTI measures 1.15 cm. Aortic valve mean gradient measures 28.0  mmHg. Aortic valve Vmax measures 3.55 m/s.  2. Left ventricular ejection fraction, by estimation, is >75%. The left ventricle has hyperdynamic function. The left ventricle has no regional wall motion abnormalities. There is moderate asymmetric left ventricular hypertrophy of the basal-septal segment. Left ventricular diastolic parameters are consistent with Grade I diastolic dysfunction (impaired relaxation).  3. Right ventricular systolic function is normal. The right ventricular size is normal. Tricuspid regurgitation signal is inadequate for assessing PA pressure.  4. The mitral valve is degenerative. No evidence of mitral valve regurgitation. Mild mitral stenosis. The mean mitral valve gradient is 4.0 mmHg with average heart rate of 73 bpm.  5. 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  6. The inferior vena cava is normal in size with greater than 50% respiratory variability, suggesting right atrial pressure of 3 mmHg. Comparison(s): No significant change from prior study. EF unchanged. AS remains moderate. FINDINGS  Left Ventricle: Left ventricular ejection fraction, by estimation, is >75%. The left ventricle has hyperdynamic function. The left ventricle has  no regional wall motion abnormalities. The left ventricular internal cavity size was normal in size. There is moderate asymmetric left ventricular hypertrophy of the basal-septal segment. Left ventricular diastolic parameters are consistent  with Grade I diastolic dysfunction (impaired relaxation). Right Ventricle: The right ventricular size is normal. No increase in right ventricular wall thickness. Right ventricular systolic function is normal. Tricuspid regurgitation signal is inadequate for assessing PA pressure. Left Atrium: Left atrial size was normal in size. Right Atrium: Right atrial size was normal in size. Pericardium: Trivial pericardial effusion is present. Presence of pericardial fat pad. Mitral Valve: The mitral valve is degenerative in appearance. Mild mitral annular calcification. No evidence of mitral valve regurgitation. Mild mitral valve stenosis. MV peak gradient, 13.0 mmHg. The mean mitral valve gradient is 4.0 mmHg with average heart rate of 73 bpm. Tricuspid Valve: The tricuspid valve is grossly normal. Tricuspid valve regurgitation is mild . No evidence of tricuspid stenosis. Aortic Valve: The aortic valve is tricuspid. There is severe calcifcation of the aortic valve. There is severe thickening of the aortic valve. Aortic valve regurgitation is mild. Aortic regurgitation PHT measures 446 msec. Aortic valve mean gradient measures 28.0 mmHg. Aortic valve peak gradient measures 50.4 mmHg. Aortic valve area, by VTI measures 1.15 cm. Pulmonic Valve: The pulmonic valve was grossly normal. Pulmonic valve regurgitation is not visualized. No evidence of pulmonic stenosis. Aorta: The aortic root is normal in size and structure. 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 Venous: The inferior vena cava is normal in size with greater than 50% respiratory variability, suggesting right atrial pressure of 3 mmHg. IAS/Shunts: The interatrial septum appears to be lipomatous. The  atrial septum is grossly normal.  LEFT VENTRICLE PLAX 2D LVIDd:         4.50 cm   Diastology LVIDs:         2.10 cm   LV e' medial:    4.47 cm/s LV PW:         1.10 cm   LV E/e' medial:  22.0 LV IVS:        1.40 cm   LV e' lateral:   6.16 cm/s LVOT diam:     2.00 cm   LV E/e' lateral: 16.0 LV SV:         73 LV SV Index:   41        2D Longitudinal Strain LVOT Area:     3.14 cm  2D Strain GLS (A2C):   -18.9 %                          2D Strain GLS (A3C):   -14.5 %                          2D Strain GLS (A4C):   -14.6 %                          2D Strain GLS Avg:     -16.0 % RIGHT VENTRICLE             IVC RV Basal diam:  3.50 cm     IVC diam: 1.80 cm RV S prime:     18.90 cm/s TAPSE (M-mode): 2.5 cm LEFT ATRIUM             Index        RIGHT ATRIUM           Index LA diam:        4.50 cm 2.55 cm/m   RA Pressure: 3.00 mmHg LA Vol (A2C):  36.7 ml 20.80 ml/m  RA Area:     14.20 cm LA Vol (A4C):   42.1 ml 23.86 ml/m  RA Volume:   31.30 ml  17.74 ml/m LA Biplane Vol: 39.6 ml 22.44 ml/m  AORTIC VALVE AV Area (Vmax):    1.06 cm AV Area (Vmean):   1.08 cm AV Area (VTI):     1.15 cm AV Vmax:           355.00 cm/s AV Vmean:          229.500 cm/s AV VTI:            0.632 m AV Peak Grad:      50.4 mmHg AV Mean Grad:      28.0 mmHg LVOT Vmax:         120.00 cm/s LVOT Vmean:        79.000 cm/s LVOT VTI:          0.232 m LVOT/AV VTI ratio: 0.37 AI PHT:            446 msec  AORTA Ao Root diam: 3.50 cm Ao Asc diam:  4.40 cm MITRAL VALVE                TRICUSPID VALVE                             Estimated RAP:  3.00 mmHg MV Area VTI:   2.14 cm MV Peak grad:  13.0 mmHg    SHUNTS MV Mean grad:  4.0 mmHg     Systemic VTI:  0.23 m MV Vmax:       1.80 m/s     Systemic Diam: 2.00 cm MV Vmean:      81.1 cm/s MV Decel Time: 232 msec MV E velocity: 98.50 cm/s MV A velocity: 163.00 cm/s MV E/A ratio:  0.60 Eleonore Chiquito MD Electronically signed by Eleonore Chiquito MD Signature Date/Time: 03/28/2021/2:22:45 PM    Final        IMPRESSION/PLAN: This is a very nice 80 year old woman with locally advanced left breast cancer.  She anticipates breast conserving surgery after talking with her surgeon.  She will not receive chemotherapy but will receive antiestrogen therapy based on her tumor board discussion.  It was a pleasure meeting the patient today. We discussed the risks, benefits, and side effects of radiotherapy. I recommend radiotherapy to the left breast to reduce her risk of locoregional recurrence by 2/3.  We discussed that radiation would take approximately 4-6 weeks to complete and that I would give the patient a few weeks to heal following surgery before starting treatment planning.  We spoke about acute effects including skin irritation and fatigue as well as much less common late effects including internal organ injury or irritation. We spoke about the latest technology that is used to minimize the risk of late effects for patients undergoing radiotherapy to the breast or chest wall. No guarantees of treatment were given. The patient is enthusiastic about proceeding with treatment. I look forward to participating in the patient's care.  I will await her referral back to me for postoperative follow-up and eventual CT simulation/treatment planning.  On date of service, in total, I spent 35 minutes on this encounter. Patient was seen in person.   __________________________________________   Eppie Gibson, MD  This document serves as a record of services personally performed by Eppie Gibson, MD. It was created on her behalf by Roney Mans,  a trained medical scribe. The creation of this record is based on the scribe's personal observations and the provider's statements to them. This document has been checked and approved by the attending provider.

## 2021-04-26 ENCOUNTER — Inpatient Hospital Stay: Payer: Medicare HMO

## 2021-04-26 ENCOUNTER — Ambulatory Visit: Payer: Medicare HMO | Attending: Surgery | Admitting: Physical Therapy

## 2021-04-26 ENCOUNTER — Encounter: Payer: Self-pay | Admitting: *Deleted

## 2021-04-26 ENCOUNTER — Encounter: Payer: Self-pay | Admitting: Radiation Oncology

## 2021-04-26 ENCOUNTER — Encounter: Payer: Self-pay | Admitting: Hematology

## 2021-04-26 ENCOUNTER — Inpatient Hospital Stay (HOSPITAL_BASED_OUTPATIENT_CLINIC_OR_DEPARTMENT_OTHER): Payer: Medicare HMO | Admitting: Hematology

## 2021-04-26 ENCOUNTER — Other Ambulatory Visit: Payer: Self-pay | Admitting: *Deleted

## 2021-04-26 ENCOUNTER — Other Ambulatory Visit: Payer: Self-pay

## 2021-04-26 ENCOUNTER — Ambulatory Visit: Payer: Self-pay | Admitting: Surgery

## 2021-04-26 ENCOUNTER — Ambulatory Visit
Admission: RE | Admit: 2021-04-26 | Discharge: 2021-04-26 | Disposition: A | Discharge status: Home / Self Care | Payer: Medicare HMO | Source: Ambulatory Visit | Attending: Radiation Oncology | Admitting: Radiation Oncology

## 2021-04-26 ENCOUNTER — Encounter: Payer: Self-pay | Admitting: Physical Therapy

## 2021-04-26 VITALS — BP 145/64 | HR 70 | Temp 97.7°F | Resp 18 | Ht 62.0 in | Wt 167.2 lb

## 2021-04-26 DIAGNOSIS — C50512 Malignant neoplasm of lower-outer quadrant of left female breast: Secondary | ICD-10-CM

## 2021-04-26 DIAGNOSIS — R262 Difficulty in walking, not elsewhere classified: Secondary | ICD-10-CM | POA: Diagnosis not present

## 2021-04-26 DIAGNOSIS — C50912 Malignant neoplasm of unspecified site of left female breast: Secondary | ICD-10-CM

## 2021-04-26 DIAGNOSIS — Z170 Estrogen receptor positive status [ER+]: Secondary | ICD-10-CM | POA: Insufficient documentation

## 2021-04-26 DIAGNOSIS — R293 Abnormal posture: Secondary | ICD-10-CM | POA: Diagnosis not present

## 2021-04-26 LAB — CMP (CANCER CENTER ONLY)
ALT: 25 U/L (ref 0–44)
AST: 25 U/L (ref 15–41)
Albumin: 3.6 g/dL (ref 3.5–5.0)
Alkaline Phosphatase: 86 U/L (ref 38–126)
Anion gap: 12 (ref 5–15)
BUN: 15 mg/dL (ref 8–23)
CO2: 23 mmol/L (ref 22–32)
Calcium: 8.9 mg/dL (ref 8.9–10.3)
Chloride: 101 mmol/L (ref 98–111)
Creatinine: 0.84 mg/dL (ref 0.44–1.00)
GFR, Estimated: >60 mL/min (ref >60)
Glucose, Bld: 251 mg/dL — ABNORMAL HIGH (ref 70–99)
Potassium: 3.8 mmol/L (ref 3.5–5.1)
Sodium: 136 mmol/L (ref 135–145)
Total Bilirubin: 0.3 mg/dL (ref 0.3–1.2)
Total Protein: 7.4 g/dL (ref 6.5–8.1)

## 2021-04-26 LAB — CBC WITH DIFFERENTIAL (CANCER CENTER ONLY)
Abs Immature Granulocytes: 0.03 10*3/uL (ref 0.00–0.07)
Basophils Absolute: 0.1 10*3/uL (ref 0.0–0.1)
Basophils Relative: 1 %
Eosinophils Absolute: 0.4 10*3/uL (ref 0.0–0.5)
Eosinophils Relative: 4 %
HCT: 40.3 % (ref 36.0–46.0)
Hemoglobin: 13.5 g/dL (ref 12.0–15.0)
Immature Granulocytes: 0 %
Lymphocytes Relative: 31 %
Lymphs Abs: 3 10*3/uL (ref 0.7–4.0)
MCH: 29.3 pg (ref 26.0–34.0)
MCHC: 33.5 g/dL (ref 30.0–36.0)
MCV: 87.4 fL (ref 80.0–100.0)
Monocytes Absolute: 0.8 10*3/uL (ref 0.1–1.0)
Monocytes Relative: 9 %
Neutro Abs: 5.3 10*3/uL (ref 1.7–7.7)
Neutrophils Relative %: 55 %
Platelet Count: 359 10*3/uL (ref 150–400)
RBC: 4.61 MIL/uL (ref 3.87–5.11)
RDW: 13.5 % (ref 11.5–15.5)
WBC Count: 9.7 10*3/uL (ref 4.0–10.5)
nRBC: 0 % (ref 0.0–0.2)

## 2021-04-26 LAB — GENETIC SCREENING ORDER

## 2021-04-26 NOTE — Patient Instructions (Signed)

## 2021-04-26 NOTE — Therapy (Signed)
[facility] @ [facility] [place], [place], [address] Phone: [phone]   Fax:  [phone]  Physical Therapy Evaluation  Patient Details  Name: Yvonne [name] MRN: [id number] Date of Birth: April 26, 1941 Referring Provider (PT): Dr. Erroll Luna   Encounter Date: 04/26/2021   PT End of Session - 04/26/21 1136     Visit Number 1    Number of Visits 2    Date for PT Re-Evaluation 06/21/21    PT Start Time 0925    PT Stop Time 1000    PT Time Calculation (min) 35 min    Activity Tolerance Patient tolerated treatment well    Behavior During Therapy [facility] for tasks assessed/performed             Past Medical History:  Diagnosis Date   Anemia    Anxiety    Aortic stenosis    severe by 2021 echo    Arthritis    Bilateral carotid artery disease ([place]) 02/04/2017   Carotid US 3/22: R 1-39; L 100   Controlled type 2 diabetes mellitus with chronic kidney disease, without long-term current use of insulin ([place]) 11/27/2019   Depression, recurrent ([place]) 02/10/2019   Hypertension    Hypothyroidism    Impaired functional mobility, balance, gait, and endurance 09/02/2015   Malignant neoplasm of lower-outer quadrant of left breast of female, estrogen receptor positive ([place]) 04/24/2021   Mild cognitive impairment with memory loss 09/02/2015   Osteopenia    Stroke ([place])    pt was 40    Past Surgical History:  Procedure Laterality Date   ABDOMINAL HYSTERECTOMY     BRAIN SURGERY     age 89's   TOE AMPUTATION  2013   2nd toe on right foot, hammer toe   TONSILLECTOMY     TOTAL HIP ARTHROPLASTY Left 03/19/2017   Procedure: LEFT TOTAL HIP ARTHROPLASTY ANTERIOR APPROACH;  Surgeon: Mcarthur Rossetti, MD;  Location: [facility];  Service: Orthopedics;  Laterality: Left;    There were no vitals filed for this visit.    Subjective Assessment - 04/26/21 1126     Subjective Patient reports she is here today to be seen by her medical  team for her newly diagnosed left breast cancer.    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 It measures 7 cm and is located in te lower outer quadrant. It is ER/PR positive and HER2 negative with a Ki67 of 5%. She had a CVA at age 88 which resulted in right sided weakness. She is functionally limited and has a caregiver 1.5 hours/day 5 days/week.    Patient Stated Goals Reduce lymphedema risk and learn post op HEP    Currently in Pain? No/denies                [facility] PT Assessment - 04/26/21 0001       Assessment   Medical Diagnosis Left breast cancer    Referring Provider (PT) Dr. Marcello Moores [place]    Onset Date/Surgical Date 04/05/21    Hand Dominance Right    Prior Therapy none      Precautions   Precautions Other (comment);Fall    Precaution Comments active cancer      Restrictions   Weight Bearing Restrictions No      Balance Screen   Has the patient fallen in the past 6 months No    Has the patient had  a decrease in activity level because of a fear of falling?  No    Is the patient reluctant to leave their home because of a fear of falling?  No      Home Environment   Living Environment Private residence    Living Arrangements Alone    Available Help at Discharge Family      Prior Function   Level of [place] Retired    Leisure She does not work      Charity fundraiser Status Within [employer] for tasks assessed      Posture/Postural Control   Posture/Postural Control Postural limitations    Postural Limitations Rounded Shoulders;Forward head      ROM / Strength   AROM / PROM / Strength AROM;Strength      AROM   Overall AROM Comments Bilateral cervical sidebending and rotation limited 50%; cervical extension limited 75%    AROM Assessment Site Shoulder    Right/Left Shoulder Right;Left    Right Shoulder Extension 38 Degrees     Right Shoulder Flexion 82 Degrees    Right Shoulder ABduction 70 Degrees    Right Shoulder Internal Rotation --   Not tested due to limited abduction   Right Shoulder External Rotation --   Not tested due to limited abduction   Left Shoulder Extension 53 Degrees    Left Shoulder Flexion 135 Degrees    Left Shoulder ABduction 135 Degrees    Left Shoulder Internal Rotation 44 Degrees    Left Shoulder External Rotation 64 Degrees      Strength   Overall Strength Within functional limits for tasks performed      Ambulation/Gait   Ambulation/Gait Yes    Gait Comments Pt ambulates with slow, shuffled gait, reduceed heel strike, reduced trunk rotation, and no arm swing.               LYMPHEDEMA/ONCOLOGY QUESTIONNAIRE - 04/26/21 0001       Type   Cancer Type Left breast cancer      Lymphedema Assessments   Lymphedema Assessments Upper extremities      Right Upper Extremity Lymphedema   10 cm Proximal to Olecranon Process 27 cm    Olecranon Process 22.3 cm    10 cm Proximal to Ulnar Styloid Process 20.4 cm    Just Proximal to Ulnar Styloid Process 15.7 cm    Across Hand at [employer] 18 cm    At [place] of 2nd Digit 6.1 cm      Left Upper Extremity Lymphedema   10 cm Proximal to Olecranon Process 25.6 cm    Olecranon Process 22.7 cm    10 cm Proximal to Ulnar Styloid Process 21.3 cm    Just Proximal to Ulnar Styloid Process 15.8 cm    Across Hand at [employer] 18 cm    At [place] of 2nd Digit 6.2 cm             L-DEX FLOWSHEETS - 04/26/21 1100       L-DEX LYMPHEDEMA SCREENING   Measurement Type Unilateral    L-DEX MEASUREMENT EXTREMITY Upper Extremity    POSITION  Standing    DOMINANT SIDE Right    At Risk Side Left    BASELINE SCORE (UNILATERAL) 8.8                  Quick Dash - 04/26/21 0001     Open a tight or new  jar Moderate difficulty    Do heavy household chores (wash walls, wash floors) Mild difficulty    Carry a shopping bag or  briefcase No difficulty    Wash your back Moderate difficulty    Use a knife to cut food Mild difficulty    Recreational activities in which you take some force or impact through your arm, shoulder, or hand (golf, hammering, tennis) No difficulty    During the past week, to what extent has your arm, shoulder or hand problem interfered with your normal social activities with family, friends, neighbors, or groups? Not at all    During the past week, to what extent has your arm, shoulder or hand problem limited your work or other regular daily activities Not at all    Arm, shoulder, or hand pain. Moderate    Tingling (pins and needles) in your arm, shoulder, or hand None    Difficulty Sleeping Severe difficulty    DASH Score 25 %              Objective measurements completed on examination: See above findings.       Patient was instructed today in a home exercise program today for post op shoulder range of motion. These included active assist shoulder flexion in sitting, scapular retraction, wall walking with shoulder abduction, and hands behind head external rotation.  She was encouraged to do these twice a day, holding 3 seconds and repeating 5 times when permitted by her physician.           PT Education - 04/26/21 1136     Education Details Lymphedema risk reduction and post op HEP    Person(s) Educated Patient;Caregiver(s)    Methods Explanation;Demonstration;Handout    Comprehension Returned demonstration;Verbalized understanding                 PT Long Term Goals - 04/26/21 1141       PT LONG TERM GOAL #1   Title Patient will demonstrate she has regained full shoulder ROM and function post operatively compared to baselines.    Time 8    Period Weeks    Status New    Target Date 06/21/21             Breast Clinic Goals - 04/26/21 1141       Patient will be able to verbalize understanding of pertinent lymphedema risk reduction practices relevant  to her diagnosis specifically related to skin care.   Time 1    Period Days    Status Achieved      Patient will be able to return demonstrate and/or verbalize understanding of the post-op home exercise program related to regaining shoulder range of motion.   Time 1    Period Days    Status Achieved      Patient will be able to verbalize understanding of the importance of attending the postoperative After Breast Cancer Class for further lymphedema risk reduction education and therapeutic exercise.   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 It measures 7 cm and is located in te lower outer quadrant. It is ER/PR positive and HER2 negative with a Ki67 of 5%. She had a CVA at age 36  which resulted in right sided weakness. She is functionally limited and has a caregiver 1.5 hours/day 5 days/week. Her multidisciplinary medical team met prior to her assessments to determine a recommended treatment plan. She is planning to ha a left lumpectomy and sentinel node biopsy followed by radiation and anti-estrogen therapy. She will benefit from a post op PT reassessment to determine needs and from L-Dex screens every 3 months for 2 years to detect subclinical lymphedema.    Stability/Clinical Decision Making Stable/Uncomplicated    Clinical Decision Making Low    Rehab Potential Excellent    PT Frequency --   Eval and 1 f/u visit   PT Treatment/Interventions ADLs/Self Care Home Management;Therapeutic exercise;Patient/family education    PT Next Visit Plan Will reassess 3-4 weeks post op    PT Home Exercise Plan Post op HEP    Consulted and Agree with Plan of Care Patient;Family member/caregiver    Family Member Consulted Caregiver             Patient will benefit from skilled therapeutic intervention in order  to improve the following deficits and impairments:  Postural dysfunction, Decreased range of motion, Decreased knowledge of precautions, Impaired UE functional use, Pain  Visit Diagnosis: Malignant neoplasm of lower-outer quadrant of left breast of female, estrogen receptor positive ([place]) - Plan: PT plan of care cert/re-cert  Abnormal posture - Plan: PT plan of care cert/re-cert  Difficulty in walking, not elsewhere classified - Plan: PT plan of care cert/re-cert  Patient will follow up at outpatient cancer rehab 3-4 weeks following surgery.  If the patient requires physical therapy at that time, a specific plan will be dictated and sent to the referring physician for approval. The patient was educated today on appropriate basic range of motion exercises to begin post operatively and the importance of attending the After Breast Cancer class following surgery.  Patient was educated today on lymphedema risk reduction practices as it pertains to recommendations that will benefit the patient immediately following surgery.  She verbalized good understanding.      Problem List Patient Active Problem List   Diagnosis Date Noted   Malignant neoplasm of lower-outer quadrant of left breast of female, estrogen receptor positive ([place]) 04/24/2021   Gait instability 02/17/2021   Counseling regarding advanced care planning and goals of care 09/02/2020   Controlled type 2 diabetes mellitus with chronic kidney disease, without long-term current use of insulin ([place]) 11/27/2019   Memory impairment 08/31/2019   Depression, recurrent ([place]) 02/10/2019   Degenerative spondylolisthesis 06/06/2018   Sensorineural hearing loss (SNHL), 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   Bilateral carotid artery disease ([place]) 02/04/2017   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

## 2021-04-26 NOTE — Progress Notes (Signed)
[place]   Telephone:([phone]) [phone] Fax:([phone]) [place] Note   Patient Care Team: Martyn Malay, MD as PCP - General (Family Medicine) Nahser, Wonda Cheng, MD as PCP - Cardiology (Cardiology) Kennith Center, RD as Dietitian (Family Medicine) Mauro Kaufmann, RN as Oncology Nurse Navigator Rockwell Germany, RN as Oncology Nurse Navigator Erroll Luna, MD as Consulting Physician (General Surgery) Truitt Merle, MD as Consulting Physician (Hematology) Eppie Gibson, MD as Attending Physician (Radiation Oncology)  Date of Service:  04/26/2021   CHIEF COMPLAINTS/PURPOSE OF CONSULTATION:  Left Breast Cancer, ER+  REFERRING PHYSICIAN:  [facility]   ASSESSMENT & PLAN:  Yvonne [name] is a 80 y.o. postmenopausal female with a history of stroke  1. 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 B/L diagnostic MM/US 04/05/21 showed irregular left breast mass at 4-6 o'clock, size indeterminate by [place] but it appears to be large, about 7cm. --We discussed her imaging findings and the biopsy results in great details. -Given her large size of breast, she is likely a candidate for lumpectomy. She is agreeable with that. She was seen by Dr. Brantley Stage today and likely will proceed with surgery soon.  -Given her advanced age and overall health, she is not a candidate for chemotherapy.  I do not recommend Oncotype test. -Giving the strong ER and PR expression in her postmenopausal status, I recommend adjuvant endocrine therapy with aromatase inhibitor for a total of 5-10 years to reduce the risk of cancer recurrence. Potential benefits and side effects were discussed with patient and she is interested. -She was also seen by radiation oncologist Dr. Isidore Moos today. She will likely benefit from breast radiation if she undergo lumpectomy to decrease the risk of breast cancer.  -We  also discussed the breast cancer surveillance after her surgery. She will continue annual screening mammogram, self exam, and a routine office visit with lab and exam with [place]. -I encouraged her to have healthy diet and exercise regularly.   2. Bone Health -Her most recent DEXA was 01/06/15 and was normal. I recommend repeat in the near future.   PLAN:  -she will proceed with lumpectomy under Dr. Brantley Stage soon. -I will see her back after breast radiation   Oncology History Overview Note  Cancer Staging Malignant neoplasm of lower-outer quadrant of left breast of female, estrogen receptor positive ([place]) Staging form: Breast, AJCC 8th Edition - Clinical stage from 04/25/2021: Stage IIA (cT3, cN0, cM0, G1, ER+, PR+, HER2-) - Signed by Truitt Merle, MD on 04/26/2021    Malignant neoplasm of lower-outer quadrant of left breast of female, estrogen receptor positive ([place])  04/05/2021 Mammogram   Exam: 3D Mammogram Diagnostic Bilateral Digital - Bilateral Breast Ultrasound - Limited Bilateral  IMPRESSION: The irregular mass in the left breast is highly suspicious of malignancy.   04/19/2021 Pathology Results   Diagnosis  Breast, left, needle core biopsy, mass 7x7x4cm BIRADS5  - INVASIVE MAMMARY CARCINOMA  - MAMMARY CARCINOMA IN SITU Microscopic Comment The biopsy material shows an infiltrative proliferation of cells arranged linearly and in small clusters. Based on the biopsy, the carcinoma appears Nottingham grade 1-2 pf 3 and measures 1.6 cm in greatest linear extent.  E-cadherin is POSITIVE supporting a ductal origin  PROGNOSTIC INDICATORS Results: The tumor cells are EQUIVOCAL for Her2 (2+). Her2 by FISH will be performed and results reported separately. Estrogen Receptor: 100%, POSITIVE, STRONG STAINING  INTENSITY Progesterone Receptor: 100%, POSITIVE, STRONG STAINING INTENSITY Proliferation Marker Ki67: 5%  FLUORESCENCE IN-SITU HYBRIDIZATION Results: GROUP 5: HER2 **NEGATIVE**    04/24/2021 Initial Diagnosis   Malignant neoplasm of lower-outer quadrant of left breast of female, estrogen receptor positive ([place])   04/25/2021 Cancer Staging   Staging form: Breast, AJCC 8th Edition - Clinical stage from 04/25/2021: Stage IIA (cT3, cN0, cM0, G1, ER+, PR+, HER2-) - Signed by Truitt Merle, MD on 04/26/2021 Stage prefix: Initial diagnosis Histologic grading system: 3 grade system       HISTORY OF PRESENTING ILLNESS:  Yvonne [name] 80 y.o. female is a here because of breast cancer. The patient was referred by [facility]. The patient presents to the clinic today accompanied by her caregiver.   She presented with a palpable left breast mass. Prior mammogram in 02/2020 was negative. She underwent bilateral diagnostic mammography and bilateral breast ultrasonography on 04/05/21 showing: irregular mass in left breast at 4-6 o'clock, elastography imaging assessment is hard.  Biopsy on 04/19/21 showed: invasive and in situ mammary carcinoma, e-cadherin positive, grade 1-2. Mass measured 1.6 cm by biopsy. Prognostic indicators significant for: estrogen receptor, 100% positive and progesterone receptor, 100% positive. Proliferation marker Ki67 at 5%. HER2 negative by FISH.   Today the patient notes they felt/feeling prior/after... -she and her caregiver report she felt the mass back in August, but they note there was a delay in getting appointments. -she denies any new pain or pain currently, notes arthritis in her hands.   She has a PMHx of.... -s/p hysterectomy at age 61 -s/p stroke at age 4, which affected her left side   Socially... -she is widowed with two children, who both live in [place], and two grandchildren. Husband passed away about 20 years ago. -Son [name] is her 41 and more involved in her care than her daughter. -no known family history of cancer.   GYN HISTORY  Menarchal: xx LMP: age 47, with hysterectomy Contraceptive: HRT:  GP: 2 She denies hot flashes  or other menopause symptoms after hysterectomy   REVIEW OF SYSTEMS:    Constitutional: Denies fevers, chills or abnormal night sweats Eyes: Denies blurriness of vision, double vision or watery eyes Ears, nose, mouth, throat, and face: Denies mucositis or sore throat Respiratory: Denies cough, dyspnea or wheezes Cardiovascular: Denies palpitation, chest discomfort or lower extremity swelling Gastrointestinal:  Denies nausea, heartburn or change in bowel habits Skin: Denies abnormal skin rashes Lymphatics: Denies new lymphadenopathy or easy bruising Neurological:Denies numbness, tingling or new weaknesses Behavioral/Psych: Mood is stable, no new changes  All other systems were reviewed with the patient and are negative.   MEDICAL HISTORY:  Past Medical History:  Diagnosis Date   Anemia    Anxiety    Aortic stenosis    severe by 2021 echo    Arthritis    Bilateral carotid artery disease ([place]) 02/04/2017   Carotid US 3/22: R 1-39; L 100   Controlled type 2 diabetes mellitus with chronic kidney disease, without long-term current use of insulin ([place]) 11/27/2019   Depression, recurrent ([place]) 02/10/2019   Hypertension    Hypothyroidism    Impaired functional mobility, balance, gait, and endurance 09/02/2015   Malignant neoplasm of lower-outer quadrant of left breast of female, estrogen receptor positive ([place]) 04/24/2021   Mild cognitive impairment with memory loss 09/02/2015   Osteopenia    Stroke ([place])    pt was 40    SURGICAL HISTORY: Past Surgical History:  Procedure Laterality Date   ABDOMINAL  HYSTERECTOMY     BRAIN SURGERY     age 29's   TOE AMPUTATION  2013   2nd toe on right foot, hammer toe   TONSILLECTOMY     TOTAL HIP ARTHROPLASTY Left 03/19/2017   Procedure: LEFT TOTAL HIP ARTHROPLASTY ANTERIOR APPROACH;  Surgeon: Mcarthur Rossetti, MD;  Location: [facility];  Service: Orthopedics;  Laterality: Left;    SOCIAL HISTORY: Social History   Socioeconomic History    Marital status: Widowed    Spouse name: Not on file   Number of children: 2   Years of education: masters   Highest education level: Not on file  Occupational History   Occupation: Primary school teacher: RETIRED  Tobacco Use   Smoking status: Never   Smokeless tobacco: Never  Vaping Use   Vaping Use: Never used  Substance and Sexual Activity   Alcohol use: Not Currently    Alcohol/week: 1.0 standard drink    Types: 1 Glasses of wine per week   Drug use: No   Sexual activity: Not Currently    Birth control/protection: Post-menopausal  Other Topics Concern   Not on file  Social History Narrative   Health Care POA:    Emergency Contact: son, Doreene Eland, (c) [phone]   End of Life Plan:    Who lives with you: lives alone now    Any pets: 2 cats, Sadie and Katie   Diet: Pt has a varied diet and tries to limit sweets.    Exercise: Pt walks on treadmill 10 minutes a day and uses bike 10 minutes a day.   Seatbelts: Pt reports wearing seatbelt when in vehicles.    Sun Exposure/Protection: Pt does not use sun protection.   Hobbies: reading, watching movies, writing      Husband passed away about 10 years ago    Social Determinants of Radio broadcast assistant Strain: Not on file  Food Insecurity: Not on file  Transportation Needs: Not on file  Physical Activity: Not on file  Stress: Not on file  Social Connections: Not on file  Intimate Partner Violence: Not on file    FAMILY HISTORY: Family History  Problem Relation Age of Onset   Thyroid disease Mother    Heart disease Father     ALLERGIES:  has No Known Allergies.  MEDICATIONS:  Current Outpatient Medications  Medication Sig Dispense Refill   amitriptyline (ELAVIL) 50 MG tablet Take 1 tablet (50 mg total) by mouth at bedtime. 90 tablet 3   amLODipine (NORVASC) 5 MG tablet Take 1 tablet (5 mg total) by mouth at bedtime. 90 tablet 3   aspirin 81 MG chewable tablet Chew 1 tablet (81 mg total) by mouth  daily. (Patient taking differently: Chew 81 mg by mouth daily. At bedtime) 90 tablet 3   atorvastatin (LIPITOR) 20 MG tablet Take 1 tablet (20 mg total) by mouth daily. 90 tablet 3   benazepril (LOTENSIN) 40 MG tablet Take 1 tablet (40 mg total) by mouth daily. 90 tablet 3   gabapentin (NEURONTIN) 100 MG capsule 1 tablet in the morning and 2 at night (Patient taking differently: Take 400 mg by mouth 2 (two) times daily. 2 tablet in the morning and 2 at night) 270 capsule 3   glucose blood (RELION TRUE METRIX TEST STRIPS) test strip Use as instructed 100 each 12   hydrochlorothiazide (HYDRODIURIL) 12.5 MG tablet Take 1 tablet (12.5 mg total) by mouth daily. 90 tablet 3   levothyroxine (SYNTHROID)  88 MCG tablet Take 1 tablet (88 mcg total) by mouth every morning. 30 minutes before food 90 tablet 3   Misc Natural Products (OSTEO BI-FLEX ADV TRIPLE ST) TABS Take 2 tablets by mouth daily. 74m of Vitamin C  (Ascorbic Acid); 236mManganese (Manganese Sulfate); 3555modium; 1500m45mucosamine HCI; 100mg8moxin (Boswellia Serrata Extract resin); 1103mg 75mndroitin/MSM Complex (Chrondroitin Sulfate, Methylsulfonylmethane, Collagen (Hydrolyzed Gelatin), Boswellia Serrata (resin); Boron (Bororganic Glycine); Hyaluronic Acid     Multiple Vitamins-Minerals (CENTRUM SILVER ADULT 50+) TABS Take 1 tablet by mouth daily.     No current facility-administered medications for this visit.    PHYSICAL EXAMINATION: ECOG PERFORMANCE STATUS: 2 - Symptomatic, <50% confined to bed  Vitals:   04/26/21 0842  BP: (!) 145/64  Pulse: 70  Resp: 18  Temp: 97.7 F (36.5 C)  SpO2: 96%   Filed Weights   04/26/21 0842  Weight: 167 lb 3.2 oz (75.8 kg)    GENERAL:alert, no distress and comfortable SKIN: skin color, texture, turgor are normal, no rashes or significant lesions EYES: normal, Conjunctiva are pink and non-injected, sclera clear  NECK: supple, thyroid normal size, non-tender, without nodularity LYMPH:  no  palpable lymphadenopathy in the cervical, axillary  LUNGS: clear to auscultation and percussion with normal breathing effort HEART: regular rate & rhythm and no murmurs and no lower extremity edema Musculoskeletal:no cyanosis of digits and no clubbing  NEURO: alert & oriented x 3 with fluent speech, no focal motor/sensory deficits BREAST: 6 x 3 cm mass in the 4 o'clock position of left breast. No palpable adenopathy bilaterally.   LABORATORY DATA:  I have reviewed the data as listed CBC Latest Ref Rng & Units 04/26/2021 09/02/2020 06/02/2018  WBC 4.0 - 10.5 K/uL 9.7 8.4 11.0(H)  Hemoglobin 12.0 - 15.0 g/dL 13.5 14.3 14.6  Hematocrit 36.0 - 46.0 % 40.3 42.6 45.4  Platelets 150 - 400 K/uL 359 347 397    CMP Latest Ref Rng & Units 04/26/2021 09/02/2020 07/31/2019  Glucose 70 - 99 mg/dL 251(H) 174(H) 120(H)  BUN 8 - 23 mg/dL _0 Creatinine 0.44 - 1.00 mg/dL 0.84 0.74 0.88  Sodium 135 - 145 mmol/L 136 143 138  Potassium 3.5 - 5.1 mmol/L 3.8 3.9 3.8  Chloride 98 - 111 mmol/L 101 97 99  CO2 22 - 32 mmol/L 23 19(L) 26  Calcium 8.9 - 10.3 mg/dL 8.9 9.9 9.3  Total Protein 6.5 - 8.1 g/dL 7.4 - 6.9  Total Bilirubin 0.3 - 1.2 mg/dL 0.3 - 0.2  Alkaline Phos 38 - 126 U/L 86 - 93  AST 15 - 41 U/L 25 - 23  ALT 0 - 44 U/L 25 - 31     RADIOGRAPHIC STUDIES: I have personally reviewed the radiological images as listed and agreed with the findings in the report. ECHOCARDIOGRAM COMPLETE  Result Date: 03/28/2021    ECHOCARDIOGRAM REPORT   Patient Name:   Yvonne BEERYof Exam: 03/28/2021 Medical Rec #:  [id number]:       62.0 in Accession #:    [id number]:       165.6 lb Date of Birth:  [birth year]SA:          1.764 m Patient Age:    79 yea57       BP:           125/80 mmHg Patient Gender: F  HR:           81 bpm. Exam Location:  [address] Procedure: 2D Echo, Cardiac Doppler, Color Doppler and Strain Analysis Indications:    I35 Aortic stenosis   History:        Patient has prior history of Echocardiogram examinations, most                 recent 09/14/2020. Aortic Valve Disease; Risk                 Factors:Hypertension and Diabetes. CKD.  Sonographer:    Basilia Jumbo BS, RDCS Referring Phys: [place]  1. The aortic valve is tricuspid. There is severe calcifcation of the aortic valve. There is severe thickening of the aortic valve. Aortic valve regurgitation is mild. Aortic valve area, by VTI measures 1.15 cm. Aortic valve mean gradient measures 28.0  mmHg. Aortic valve Vmax measures 3.55 m/s.  2. Left ventricular ejection fraction, by estimation, is >75%. The left ventricle has hyperdynamic function. The left ventricle has no regional wall motion abnormalities. There is moderate asymmetric left ventricular hypertrophy of the basal-septal segment. Left ventricular diastolic parameters are consistent with Grade I diastolic dysfunction (impaired relaxation).  3. Right ventricular systolic function is normal. The right ventricular size is normal. Tricuspid regurgitation signal is inadequate for assessing PA pressure.  4. The mitral valve is degenerative. No evidence of mitral valve regurgitation. Mild mitral stenosis. The mean mitral valve gradient is 4.0 mmHg with average heart rate of 73 bpm.  5. 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  6. The inferior vena cava is normal in size with greater than 50% respiratory variability, suggesting right atrial pressure of 3 mmHg. Comparison(s): No significant change from prior study. EF unchanged. AS remains moderate. FINDINGS  Left Ventricle: Left ventricular ejection fraction, by estimation, is >75%. The left ventricle has hyperdynamic function. The left ventricle has no regional wall motion abnormalities. The left ventricular internal cavity size was normal in size. There is moderate asymmetric left ventricular hypertrophy of the basal-septal segment. Left  ventricular diastolic parameters are consistent with Grade I diastolic dysfunction (impaired relaxation). Right Ventricle: The right ventricular size is normal. No increase in right ventricular wall thickness. Right ventricular systolic function is normal. Tricuspid regurgitation signal is inadequate for assessing PA pressure. Left Atrium: Left atrial size was normal in size. Right Atrium: Right atrial size was normal in size. Pericardium: Trivial pericardial effusion is present. Presence of pericardial fat pad. Mitral Valve: The mitral valve is degenerative in appearance. Mild mitral annular calcification. No evidence of mitral valve regurgitation. Mild mitral valve stenosis. MV peak gradient, 13.0 mmHg. The mean mitral valve gradient is 4.0 mmHg with average heart rate of 73 bpm. Tricuspid Valve: The tricuspid valve is grossly normal. Tricuspid valve regurgitation is mild . No evidence of tricuspid stenosis. Aortic Valve: The aortic valve is tricuspid. There is severe calcifcation of the aortic valve. There is severe thickening of the aortic valve. Aortic valve regurgitation is mild. Aortic regurgitation PHT measures 446 msec. Aortic valve mean gradient measures 28.0 mmHg. Aortic valve peak gradient measures 50.4 mmHg. Aortic valve area, by VTI measures 1.15 cm. Pulmonic Valve: The pulmonic valve was grossly normal. Pulmonic valve regurgitation is not visualized. No evidence of pulmonic stenosis. Aorta: The aortic root is normal in size and structure. 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 Venous: The inferior vena cava is normal in size  with greater than 50% respiratory variability, suggesting right atrial pressure of 3 mmHg. IAS/Shunts: The interatrial septum appears to be lipomatous. The atrial septum is grossly normal.  LEFT VENTRICLE PLAX 2D LVIDd:         4.50 cm   Diastology LVIDs:         2.10 cm   LV e' medial:    4.47 cm/s LV PW:         1.10 cm   LV E/e' medial:  22.0 LV  IVS:        1.40 cm   LV e' lateral:   6.16 cm/s LVOT diam:     2.00 cm   LV E/e' lateral: 16.0 LV SV:         73 LV SV Index:   41        2D Longitudinal Strain LVOT Area:     3.14 cm  2D Strain GLS (A2C):   -18.9 %                          2D Strain GLS (A3C):   -14.5 %                          2D Strain GLS (A4C):   -14.6 %                          2D Strain GLS Avg:     -16.0 % RIGHT VENTRICLE             IVC RV Basal diam:  3.50 cm     IVC diam: 1.80 cm RV S prime:     18.90 cm/s TAPSE (M-mode): 2.5 cm LEFT ATRIUM             Index        RIGHT ATRIUM           Index LA diam:        4.50 cm 2.55 cm/m   RA Pressure: 3.00 mmHg LA Vol (A2C):   36.7 ml 20.80 ml/m  RA Area:     14.20 cm LA Vol (A4C):   42.1 ml 23.86 ml/m  RA Volume:   31.30 ml  17.74 ml/m LA Biplane Vol: 39.6 ml 22.44 ml/m  AORTIC VALVE AV Area (Vmax):    1.06 cm AV Area (Vmean):   1.08 cm AV Area (VTI):     1.15 cm AV Vmax:           355.00 cm/s AV Vmean:          229.500 cm/s AV VTI:            0.632 m AV Peak Grad:      50.4 mmHg AV Mean Grad:      28.0 mmHg LVOT Vmax:         120.00 cm/s LVOT Vmean:        79.000 cm/s LVOT VTI:          0.232 m LVOT/AV VTI ratio: 0.37 AI PHT:            446 msec  AORTA Ao Root diam: 3.50 cm Ao Asc diam:  4.40 cm MITRAL VALVE                TRICUSPID VALVE  Estimated RAP:  3.00 mmHg MV Area VTI:   2.14 cm MV Peak grad:  13.0 mmHg    SHUNTS MV Mean grad:  4.0 mmHg     Systemic VTI:  0.23 m MV Vmax:       1.80 m/s     Systemic Diam: 2.00 cm MV Vmean:      81.1 cm/s MV Decel Time: 232 msec MV E velocity: 98.50 cm/s MV A velocity: 163.00 cm/s MV E/A ratio:  0.60 Eleonore Chiquito MD Electronically signed by Eleonore Chiquito MD Signature Date/Time: 03/28/2021/2:22:45 PM    Final      No orders of the defined types were placed in this encounter.   All questions were answered. The patient knows to call the clinic with any problems, questions or concerns. The total time spent in the  appointment was 45 minutes.     Truitt Merle, MD 04/26/2021 10:21 PM  I, Wilburn Mylar, am acting as scribe for Truitt Merle, MD.   I have reviewed the above documentation for accuracy and completeness, and I agree with the above.

## 2021-04-27 ENCOUNTER — Telehealth: Payer: Self-pay | Admitting: *Deleted

## 2021-04-27 NOTE — Telephone Encounter (Signed)
   Pre-operative Risk Assessment    Patient Name: Yvonne [name]  DOB: August 10, 1940 MRN: [id number]      Request for Surgical Clearance   Procedure:   LUMPECTOMY  Date of Surgery: Clearance TBD                                 Surgeon:  DR. Erroll Luna Surgeon's Group or Practice Name:  [facility] Phone number:  [phone] Fax number:  [phone] ATTN: Carlene Coria, CMA   Type of Clearance Requested: - Medical  - Pharmacy:  Hold Aspirin     Type of Anesthesia:   General    Additional requests/questions:   [name]   04/27/2021, 4:37 PM

## 2021-04-28 ENCOUNTER — Encounter: Payer: Self-pay | Admitting: Family Medicine

## 2021-04-28 DIAGNOSIS — C50919 Malignant neoplasm of unspecified site of unspecified female breast: Secondary | ICD-10-CM | POA: Insufficient documentation

## 2021-04-28 NOTE — Telephone Encounter (Signed)
   Name: Yvonne [name]  DOB: [birth year]  MRN: [id number]   Primary Cardiologist: Mertie Moores, MD  Chart reviewed as part of pre-operative protocol coverage. Patient was contacted 04/28/2021 in reference to pre-operative risk assessment for pending surgery as outlined below.  Yvonne [name] was last seen on 12/05/20 by Dr. Angelena Form.  Since that day, Yvonne [name] has done okay. She has chronic SOB and mild LE edema while is stable. No anginal symptoms. Getting about 4 METS of activity.   History of arthritis, carotid artery disease, borderline diabetes, HTN, hypothyroidism, remote stroke at age 80 and paradoxical low flow/low gradient severe aortic stenosis.   Most recent Echo 03/28/21 showed stable moderate AS.   Can patient hold ASA as requested for lumpectomy? Any other recommendations for surgery?  Please forward your response to P CV DIV PREOP.   Thank you   Leanor Kail, PA 04/28/2021, 11:16 AM

## 2021-05-01 ENCOUNTER — Encounter: Payer: Self-pay | Admitting: *Deleted

## 2021-05-01 DIAGNOSIS — Z780 Asymptomatic menopausal state: Secondary | ICD-10-CM | POA: Diagnosis not present

## 2021-05-01 DIAGNOSIS — Z170 Estrogen receptor positive status [ER+]: Secondary | ICD-10-CM

## 2021-05-01 DIAGNOSIS — C50512 Malignant neoplasm of lower-outer quadrant of left female breast: Secondary | ICD-10-CM

## 2021-05-01 DIAGNOSIS — M85832 Other specified disorders of bone density and structure, left forearm: Secondary | ICD-10-CM | POA: Diagnosis not present

## 2021-05-02 ENCOUNTER — Telehealth: Payer: Self-pay

## 2021-05-02 ENCOUNTER — Encounter: Payer: Self-pay | Admitting: *Deleted

## 2021-05-02 NOTE — Progress Notes (Signed)
[facility] CSW Progress Notes  Social Work Intern contacted patient by phone per [facility] follow up. Patient had difficulty hearing over the phone and patient repeated things several times. Pt reports that she is generally doing alright, except that she fell yesterday 11/15 and hurt her back. She plans to go to urgent care today or tomorrow.   Patient expressed a need for transportation services and intern has made the referral.  Patient lives alone, son and daughter live 30 miles away. She reports she has no other support network. Pt denies any other resource/ financial needs at this time.   Intern and patient discussed common feelings and emotions when being diagnosed with cancer. Intern informed patient of the support team and support services at [facility], and pt says she may be interested in some of these services after her surgery on 11/30.  Intern provided contact information and encouraged patient to call with any questions or concerns.  Rosary Lively, Social Work Intern Supervised by Gwinda Maine, LCSW

## 2021-05-02 NOTE — Telephone Encounter (Signed)
Patient and caregiver call nurse line reporting a fall on Sunday. Caregiver reports she did not see her fall and the circumstances are unclear. Patient has been complaining of a lot of back pain. Caregiver denies any bruising to the area. Patient scheduled for evaluation for 11/18.  Advised if pain worsens to be evaluated sooner.

## 2021-05-04 ENCOUNTER — Encounter: Payer: Self-pay | Admitting: *Deleted

## 2021-05-04 ENCOUNTER — Telehealth: Payer: Self-pay | Admitting: Family Medicine

## 2021-05-04 ENCOUNTER — Telehealth: Payer: Self-pay | Admitting: *Deleted

## 2021-05-04 DIAGNOSIS — M8580 Other specified disorders of bone density and structure, unspecified site: Secondary | ICD-10-CM

## 2021-05-04 NOTE — Telephone Encounter (Signed)
Attempted to call patient to follow up on fall.   Also received DEXA scan. Needs repeat 1 year. Added to problem list.  If patient or caregiver calls back, please schedule visit. Should be seen ASAP by any provider for fall.   Dorris Singh, MD  Family Medicine Teaching Service

## 2021-05-04 NOTE — Telephone Encounter (Signed)
Left message for a return phone call to follow up from [facility] 11/9 and assess navigation needs.

## 2021-05-05 ENCOUNTER — Other Ambulatory Visit: Payer: Self-pay | Admitting: Radiation Oncology

## 2021-05-05 ENCOUNTER — Ambulatory Visit
Admission: RE | Admit: 2021-05-05 | Discharge: 2021-05-05 | Disposition: A | Discharge status: Home / Self Care | Payer: Self-pay | Source: Ambulatory Visit | Attending: Radiation Oncology | Admitting: Radiation Oncology

## 2021-05-05 ENCOUNTER — Inpatient Hospital Stay
Admission: RE | Admit: 2021-05-05 | Discharge: 2021-05-05 | Disposition: A | Discharge status: Home / Self Care | Payer: Self-pay | Source: Ambulatory Visit | Attending: Radiation Oncology | Admitting: Radiation Oncology

## 2021-05-05 ENCOUNTER — Ambulatory Visit: Payer: Medicare HMO

## 2021-05-05 DIAGNOSIS — C50512 Malignant neoplasm of lower-outer quadrant of left female breast: Secondary | ICD-10-CM

## 2021-05-08 NOTE — Progress Notes (Signed)
Surgical Instructions    Your procedure is scheduled on 05/17/21.  Report to [facility] Main Entrance "A" at 12:15 P.M., then check in with the Admitting office.  Call this number if you have problems the morning of surgery:  [phone]   If you have any questions prior to your surgery date call [phone]: Open Monday-Friday 8am-4pm    Remember:  Do not eat after midnight the night before your surgery  You may drink clear liquids until 11:15 the morning of your surgery.   Clear liquids allowed are: Water, Non-Citrus Juices (without pulp), Carbonated Beverages, Clear Tea, Black Coffee ONLY (NO MILK, CREAM OR POWDERED CREAMER of any kind), and Gatorade    Take these medicines the morning of surgery with A SIP OF WATER  amitriptyline (ELAVIL amLODipine (NORVASC) atorvastatin (LIPITOR) gabapentin (NEURONTIN) levothyroxine (SYNTHROID)   Please follow your surgeon's instructions for aspirin 81 MG. If you have not received instructions, please contact your surgeon's office for blood thinner instructions.   As of today, STOP taking any Aspirin (unless otherwise instructed by your surgeon) Aleve, Naproxen, Ibuprofen, Motrin, Advil, Goody's, BC's, all herbal medications, fish oil, and all vitamins.   HOW TO MANAGE YOUR DIABETES BEFORE AND AFTER SURGERY  Why is it important to control my blood sugar before and after surgery? Improving blood sugar levels before and after surgery helps healing and can limit problems. A way of improving blood sugar control is eating a healthy diet by:  Eating less sugar and carbohydrates  Increasing activity/exercise  Talking with your doctor about reaching your blood sugar goals High blood sugars (greater than 180 mg/dL) can raise your risk of infections and slow your recovery, so you will need to focus on controlling your diabetes during the weeks before surgery. Make sure that the doctor who takes care of your diabetes knows about your planned  surgery including the date and location.  How do I manage my blood sugar before surgery? Check your blood sugar at least 4 times a day, starting 2 days before surgery, to make sure that the level is not too high or low.  Check your blood sugar the morning of your surgery when you wake up and every 2 hours until you get to the Short Stay unit.  If your blood sugar is less than 70 mg/dL, you will need to treat for low blood sugar: Do not take insulin. Treat a low blood sugar (less than 70 mg/dL) with  cup of clear juice (cranberry or apple), 4 glucose tablets, OR glucose gel. Recheck blood sugar in 15 minutes after treatment (to make sure it is greater than 70 mg/dL). If your blood sugar is not greater than 70 mg/dL on recheck, call [phone] for further instructions. Report your blood sugar to the short stay nurse when you get to Short Stay.  If you are admitted to the hospital after surgery: Your blood sugar will be checked by the staff and you will probably be given insulin after surgery (instead of oral diabetes medicines) to make sure you have good blood sugar levels. The goal for blood sugar control after surgery is 80-180 mg/dL.   After your COVID test   You are not required to quarantine however you are required to wear a well-fitting mask when you are out and around people not in your household.  If your mask becomes wet or soiled, replace with a new one.  Wash your hands often with soap and water for 20 seconds or clean your hands  with an alcohol-based hand sanitizer that contains at least 60% alcohol.  Do not share personal items.  Notify your provider: if you are in close contact with someone who has COVID  or if you develop a fever of 100.4 or greater, sneezing, cough, sore throat, shortness of breath or body aches.           Do not wear jewelry or makeup Do not wear lotions, powders, perfumes/colognes, or deodorant. Do not shave 48 hours prior to surgery.  Men may  shave face and neck. Do not bring valuables to the hospital. DO Not wear nail polish, gel polish, artificial nails, or any other type of covering on natural nails including finger and toenails. If patients have artificial nails, gel coating, etc. that need to be removed by a nail salon, please have this removed prior to surgery or surgery may need to be canceled/delayed if the surgeon/ anesthesia feels like the patient is unable to be adequately monitored.             [facility] is not responsible for any belongings or valuables.  Do NOT Smoke (Tobacco/Vaping)  24 hours prior to your procedure  If you use a CPAP at night, you may bring your mask for your overnight stay.   Contacts, glasses, hearing aids, dentures or partials may not be worn into surgery, please bring cases for these belongings   For patients admitted to the hospital, discharge time will be determined by your treatment team.   Patients discharged the day of surgery will not be allowed to drive home, and someone needs to stay with them for 24 hours.  NO VISITORS WILL BE ALLOWED IN PRE-OP WHERE PATIENTS ARE PREPPED FOR SURGERY.  ONLY 1 SUPPORT PERSON MAY BE PRESENT IN THE WAITING ROOM WHILE YOU ARE IN SURGERY.  IF YOU ARE TO BE ADMITTED, ONCE YOU ARE IN YOUR ROOM YOU WILL BE ALLOWED TWO (2) VISITORS. 1 (ONE) VISITOR MAY STAY OVERNIGHT BUT MUST ARRIVE TO THE ROOM BY 8pm.  Minor children may have two parents present. Special consideration for safety and communication needs will be reviewed on a case by case basis.  Special instructions:    Oral Hygiene is also important to reduce your risk of infection.  Remember - BRUSH YOUR TEETH THE MORNING OF SURGERY WITH YOUR REGULAR TOOTHPASTE   [facility]- Preparing For Surgery  Before surgery, you can play an important role. Because skin is not sterile, your skin needs to be as free of germs as possible. You can reduce the number of germs on your skin by washing with CHG  (chlorahexidine gluconate) Soap before surgery.  CHG is an antiseptic cleaner which kills germs and bonds with the skin to continue killing germs even after washing.     Please do not use if you have an allergy to CHG or antibacterial soaps. If your skin becomes reddened/irritated stop using the CHG.  Do not shave (including legs and underarms) for at least 48 hours prior to first CHG shower. It is OK to shave your face.  Please follow these instructions carefully.     Shower the NIGHT BEFORE SURGERY and the MORNING OF SURGERY with CHG Soap.   If you chose to wash your hair, wash your hair first as usual with your normal shampoo. After you shampoo, rinse your hair and body thoroughly to remove the shampoo.  Then [employer] and genitals (private parts) with your normal soap and rinse thoroughly to remove soap.  After that Use CHG Soap as you would any other liquid soap. You can apply CHG directly to the skin and wash gently with a scrungie or a clean washcloth.   Apply the CHG Soap to your body ONLY FROM THE NECK DOWN.  Do not use on open wounds or open sores. Avoid contact with your eyes, ears, mouth and genitals (private parts). Wash Face and genitals (private parts)  with your normal soap.   Wash thoroughly, paying special attention to the area where your surgery will be performed.  Thoroughly rinse your body with warm water from the neck down.  DO NOT shower/wash with your normal soap after using and rinsing off the CHG Soap.  Pat yourself dry with a CLEAN TOWEL.  Wear CLEAN PAJAMAS to bed the night before surgery  Place CLEAN SHEETS on your bed the night before your surgery  DO NOT SLEEP WITH PETS.   Day of Surgery:  Take a shower with CHG soap. Wear Clean/Comfortable clothing the morning of surgery Do not apply any deodorants/lotions.   Remember to brush your teeth WITH YOUR REGULAR TOOTHPASTE.   Please read over the following fact sheets that you were given.

## 2021-05-09 ENCOUNTER — Encounter (HOSPITAL_COMMUNITY): Payer: Self-pay

## 2021-05-09 ENCOUNTER — Other Ambulatory Visit (HOSPITAL_COMMUNITY): Payer: Medicare HMO

## 2021-05-09 ENCOUNTER — Encounter (HOSPITAL_COMMUNITY)
Admission: RE | Admit: 2021-05-09 | Discharge: 2021-05-09 | Disposition: A | Discharge status: Home / Self Care | Payer: Medicare HMO | Source: Ambulatory Visit | Attending: Surgery | Admitting: Surgery

## 2021-05-09 ENCOUNTER — Other Ambulatory Visit: Payer: Self-pay

## 2021-05-09 VITALS — BP 127/65 | HR 70 | Temp 98.2°F | Resp 17 | Ht 61.0 in | Wt 164.0 lb

## 2021-05-09 DIAGNOSIS — E1122 Type 2 diabetes mellitus with diabetic chronic kidney disease: Secondary | ICD-10-CM | POA: Diagnosis not present

## 2021-05-09 DIAGNOSIS — I350 Nonrheumatic aortic (valve) stenosis: Secondary | ICD-10-CM | POA: Insufficient documentation

## 2021-05-09 DIAGNOSIS — Z853 Personal history of malignant neoplasm of breast: Secondary | ICD-10-CM | POA: Insufficient documentation

## 2021-05-09 DIAGNOSIS — N189 Chronic kidney disease, unspecified: Secondary | ICD-10-CM | POA: Insufficient documentation

## 2021-05-09 DIAGNOSIS — E039 Hypothyroidism, unspecified: Secondary | ICD-10-CM | POA: Diagnosis not present

## 2021-05-09 DIAGNOSIS — I69351 Hemiplegia and hemiparesis following cerebral infarction affecting right dominant side: Secondary | ICD-10-CM | POA: Insufficient documentation

## 2021-05-09 DIAGNOSIS — I129 Hypertensive chronic kidney disease with stage 1 through stage 4 chronic kidney disease, or unspecified chronic kidney disease: Secondary | ICD-10-CM | POA: Diagnosis not present

## 2021-05-09 DIAGNOSIS — Z01818 Encounter for other preprocedural examination: Secondary | ICD-10-CM | POA: Diagnosis not present

## 2021-05-09 LAB — BASIC METABOLIC PANEL
Anion gap: 8 (ref 5–15)
BUN: 22 mg/dL (ref 8–23)
CO2: 25 mmol/L (ref 22–32)
Calcium: 8.9 mg/dL (ref 8.9–10.3)
Chloride: 102 mmol/L (ref 98–111)
Creatinine, Ser: 0.72 mg/dL (ref 0.44–1.00)
GFR, Estimated: >60 mL/min (ref >60)
Glucose, Bld: 172 mg/dL — ABNORMAL HIGH (ref 70–99)
Potassium: 4.1 mmol/L (ref 3.5–5.1)
Sodium: 135 mmol/L (ref 135–145)

## 2021-05-09 LAB — CBC
HCT: 42 % (ref 36.0–46.0)
Hemoglobin: 13.9 g/dL (ref 12.0–15.0)
MCH: 29.5 pg (ref 26.0–34.0)
MCHC: 33.1 g/dL (ref 30.0–36.0)
MCV: 89.2 fL (ref 80.0–100.0)
Platelets: 390 10*3/uL (ref 150–400)
RBC: 4.71 MIL/uL (ref 3.87–5.11)
RDW: 13.5 % (ref 11.5–15.5)
WBC: 11.5 10*3/uL — ABNORMAL HIGH (ref 4.0–10.5)
nRBC: 0 % (ref 0.0–0.2)

## 2021-05-09 NOTE — Anesthesia Preprocedure Evaluation (Addendum)
Anesthesia Evaluation  Patient identified by MRN, date of birth, ID band Patient awake    Reviewed: Allergy & Precautions, NPO status , Patient's Chart, lab work & pertinent test results  Airway Mallampati: I       Dental  (+) Edentulous Upper, Poor Dentition   Pulmonary    Pulmonary exam normal        Cardiovascular hypertension, Pt. on medications Normal cardiovascular exam  Echo 03/28/21: IMPRESSIONS  1. The aortic valve is tricuspid. There is severe calcifcation of the  aortic valve. There is severe thickening of the aortic valve. Aortic valve  regurgitation is mild. Aortic valve area, by VTI measures 1.15 cm. Aortic  valve mean gradient measures 28.0  mmHg. Aortic valve Vmax measures 3.55 m/s.  2. Left ventricular ejection fraction, by estimation, is >75%. The left  ventricle has hyperdynamic function. The left ventricle has no regional  wall motion abnormalities. There is moderate asymmetric left ventricular  hypertrophy of the basal-septal  segment. Left ventricular diastolic parameters are consistent with Grade I  diastolic dysfunction (impaired relaxation).  3. Right ventricular systolic function is normal. The right ventricular  size is normal. Tricuspid regurgitation signal is inadequate for assessing  PA pressure.  4. The mitral valve is degenerative. No evidence of mitral valve  regurgitation. Mild mitral stenosis. The mean mitral valve gradient is 4.0  mmHg with average heart rate of 73 bpm.  5. 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  6. The inferior vena cava is normal in size with greater than 50%  respiratory variability, suggesting right atrial pressure of 3 mmHg.  - Comparison(s): No significant change from prior study. EF unchanged. AS  remains moderate.      Neuro/Psych    GI/Hepatic   Endo/Other  diabetes, Well Controlled, Type 2Hypothyroidism   Renal/GU       Musculoskeletal  (+) Arthritis , Osteoarthritis,    Abdominal Normal abdominal exam  (+)   Peds  Hematology   Anesthesia Other Findings CHRISTOPHER D MCALHANY IMPRESSIONS The aortic valve is tricuspid. There is severe calcifcation of the aortic valve. There is severe thickening of the aortic valve. Aortic valve regurgitation is mild. Aortic valve area, by VTI measures 1.15 cm. Aortic valve mean gradient measures 28.0 mmHg. Aortic valve Vmax measures 3.55 m/s. 1. Left ventricular ejection fraction, by estimation, is >75%. The left ventricle has hyperdynamic function. The left ventricle has no regional wall motion abnormalities. There is moderate asymmetric left ventricular hypertrophy of the basal-septal segment. Left ventricular diastolic parameters are consistent with Grade I diastolic dysfunction (impaired relaxation). 2. Right ventricular systolic function is normal. The right ventricular size is normal. Tricuspid regurgitation signal is inadequate for assessing PA pressure. 3. The mitral valve is degenerative. No evidence of mitral valve regurgitation. Mild mitral stenosis. The mean mitral valve gradient is 4.0 mmHg with average heart rate of 73 bpm. 4. 5. 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 The inferior vena cava is normal in size with greater than 50% respiratory variability, suggesting right atrial pressure of 3 mmHg. 6. Comparison(s): No significant change from prior study. EF unchanged. AS remains moderate. FINDINGS Left Ventricle: Left ventricular ejection fraction, by estimation, is >75%. The left ventricle has Final  Reproductive/Obstetrics                           Anesthesia Physical Anesthesia Plan  ASA: 3  Anesthesia Plan: General  Post-op Pain Management: Regional block   Induction: Intravenous  PONV Risk Score and Plan: 4 or greater and Ondansetron and Treatment may vary due to age or  medical condition  Airway Management Planned: LMA  Additional Equipment: None  Intra-op Plan:   Post-operative Plan: Extubation in OR  Informed Consent: I have reviewed the patients History and Physical, chart, labs and discussed the procedure including the risks, benefits and alternatives for the proposed anesthesia with the patient or authorized representative who has indicated his/her understanding and acceptance.       Plan Discussed with: CRNA  Anesthesia Plan Comments: (PAT note written 05/09/2021 by Myra Gianotti, PA-C. )       Anesthesia Quick Evaluation

## 2021-05-09 NOTE — Progress Notes (Addendum)
PCP - Dorris Singh Cardiologist - [place]  Chest x-ray - n/a EKG - 05/09/21 ECHO - 03/28/21  DM - Type 2 Patient stated that she was not aware that she is diabetic.  However, last A1c on 02/17/21 was 6.9.  Patient does not check sugars or take medications at home.  Jonni Sanger (home health aide) to follow up with PCP.  Aspirin Instructions: Per patient, hold ASA for 4 days  ERAS Protcol - yes, no drink ordered or given   COVID TEST- n/a (ambulatory)   Anesthesia review: yes Received cardiac clearance from Dr. Curly Shores 04/28/21.  Patient denies shortness of breath, fever, cough and chest pain at PAT appointment   All instructions explained to the patient, with a verbal understanding of the material. Patient agrees to go over the instructions while at home for a better understanding. Patient also instructed to self quarantine after being tested for COVID-19. The opportunity to ask questions was provided.  Patient's caregiver, Willa Frater, is ok to come back with patient on day of surgery to help answer questions.

## 2021-05-09 NOTE — Progress Notes (Signed)
Anesthesia Chart Review:  Case: [id number] Date/Time: 05/17/21 1400   Procedures:      LEFT BREAST SEED LUMPECTOMY (BRACKETED) (Left) - PEC BLOCK 90 MINUTES ROOM 9     RADIOACTIVE SEED GUIDED AXILLARY SENTINEL LYMPH NODE (Left)   Anesthesia type: General   Pre-op diagnosis: malignant neoplasm left breast   Location: [facility] OR ROOM 09 / [facility]   Surgeons: Erroll Luna, MD       DISCUSSION: Patient is an 80 year old female scheduled for the above procedure.   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   Preoperative cardiology input outlined on 04/28/21 by Leanor Kail, [place]. He wrote, "Patient was contacted 04/28/2021 in reference to pre-operative risk assessment for pending surgery as outlined below.  JORETTA EADS was last seen on 12/05/20 by Dr. Angelena Form.  Since that day, ANHELICA FOWERS has done okay. She has chronic SOB and mild LE edema while is stable. No anginal symptoms. Getting about 4 METS of activity.    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    Most recent Echo 03/28/21 showed stable moderate AS." He reached out to Dr. Angelena Form regarding ASA instructions or any other recommendations for surgery.  Dr. Angelena Form responded, "OK to hold ASA for surgery." She reported instructions to hold ASA for 4 days prior to surgery. She reported intermittent heart racing lasting up to 5 minutes a few nights a week,  but said this was not new. Denied any new or worsening CV symptoms since her last follow-up with cardiology. PAT EKG appeared stable with SR at 74 bpm, RBBB, LAFB.   Anesthesia team to evaluate on the day of surgery.    VS: BP 127/65   Pulse 70   Temp 36.8 C (Oral)   Resp 17   Ht 5\' 1"  (1.549 m)   Wt 74.4 kg   SpO2 95%   BMI 30.99 kg/m    PROVIDERS: Martyn Malay, MD is PCP   Lauree Chandler, MD is structural heart cardiologist. Last visit 12/15/20. He wrote, " She likely has paradoxical low flow/low gradient aortic stenosis but most recent echo only suggests her aortic stenosis is moderate. Based on prior echo findings I suspect her AS is bordering on severe but she continues to have no symptoms.  I have reviewed the echo images. The valve leaflets are thickened and calcified with limited excursion and visually appears to have severe stenosis. While she is probably not a candidate for conventional surgical AVR due to advanced age, she will be a candidate for TAVR when she becomes symptomatic." 03/28/21 echo was felt stable (moderate AS).   Mertie Moores, MD is cardiologist. Last visit 08/21/19 with Kathyrn Drown, NP. She has primarily been followed by Dr. Angelena Form since 03/2020.   Truitt Merle, MD is HEM-ONC  Eppie Gibson, MD is RAD-ONC   LABS: Labs reviewed: Acceptable for surgery. LFTs normal and A1c 6.9% on 02/17/21.  (all labs ordered are listed, but only abnormal results are displayed)  Labs Reviewed  BASIC METABOLIC PANEL - Abnormal;  Notable for the following components:      Result Value   Glucose, Bld 172 (*)    All other components within normal limits  CBC - Abnormal; Notable for the following components:   WBC 11.5 (*)    All other components within normal limits    EKG: 05/09/21:  Normal sinus rhythm Right bundle branch block Left anterior fascicular block ** Bifascicular block ** Abnormal ECG - I think EKG is similar to 03/24/20 tracing from  [facility]   CV: Echo 03/28/21: IMPRESSIONS   1. The aortic valve is tricuspid. There is severe calcifcation of the  aortic valve. There is severe thickening of the aortic valve. Aortic valve  regurgitation is mild. Aortic valve area, by VTI measures 1.15 cm. Aortic  valve mean gradient measures 28.0   mmHg. Aortic valve Vmax measures 3.55 m/s.   2. Left ventricular ejection fraction, by estimation, is >75%. The left  ventricle has hyperdynamic function. The left ventricle has no regional  wall motion abnormalities. There is moderate asymmetric left ventricular  hypertrophy of the basal-septal  segment. Left ventricular diastolic parameters are consistent with Grade I  diastolic dysfunction (impaired relaxation).   3. Right ventricular systolic function is normal. The right ventricular  size is normal. Tricuspid regurgitation signal is inadequate for assessing  PA pressure.   4. The mitral valve is degenerative. No evidence of mitral valve  regurgitation. Mild mitral stenosis. The mean mitral valve gradient is 4.0  mmHg with average heart rate of 73 bpm.   5. 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   6. The inferior vena cava is normal in size with greater than 50%  respiratory variability, suggesting right atrial pressure of 3 mmHg.  - Comparison(s): No significant change from prior study. EF unchanged. AS  remains moderate.    US Carotid 09/08/20: Summary:  - Right Carotid: Velocities in the right ICA are consistent with a 1-39% stenosis. Non-hemodynamically significant plaque <50% noted in the CCA.  - Left Carotid: Evidence consistent with a total occlusion of the left ICA.  - Vertebrals:  Bilateral vertebral arteries demonstrate antegrade flow.  - Subclavians: Normal flow hemodynamics were seen in bilateral subclavian arteries.    Past Medical History:  Diagnosis Date   Anemia    Anxiety    Aortic stenosis    severe by 2021 echo     Arthritis    Bilateral carotid artery disease ([place]) 02/04/2017   Carotid US 3/22: R 1-39; L 100   Controlled type 2 diabetes mellitus with chronic kidney disease, without long-term current use of insulin ([place]) 11/27/2019   Depression, recurrent ([place]) 02/10/2019   Hypertension    Hypothyroidism    Impaired functional mobility, balance, gait, and endurance 09/02/2015   Malignant neoplasm of lower-outer quadrant of left breast of female, estrogen receptor positive ([place]) 04/24/2021   Mild cognitive impairment with memory loss 09/02/2015   Osteopenia    Stroke ([place])    pt was 40    Past Surgical History:  Procedure Laterality Date   ABDOMINAL HYSTERECTOMY     BACK SURGERY     2019   BRAIN SURGERY     age 47's   TOE AMPUTATION  2013   2nd toe on right foot, hammer toe   TONSILLECTOMY     TOTAL HIP ARTHROPLASTY Left 03/19/2017   Procedure: LEFT TOTAL HIP ARTHROPLASTY ANTERIOR APPROACH;  Surgeon: Mcarthur Rossetti, MD;  Location: [facility];  Service: Orthopedics;  Laterality: Left;    MEDICATIONS:  acetaminophen (TYLENOL) 325 MG tablet   amitriptyline (ELAVIL) 50 MG tablet   amLODipine (NORVASC) 5 MG tablet   Ascorbic Acid (VITAMIN C GUMMIE PO)   aspirin 81 MG chewable tablet   atorvastatin (LIPITOR) 20 MG tablet   benazepril (LOTENSIN) 40 MG tablet   diclofenac Sodium (VOLTAREN) 1 % GEL   gabapentin (NEURONTIN) 100 MG capsule   glucose blood (RELION TRUE METRIX TEST STRIPS) test strip   hydrochlorothiazide (HYDRODIURIL) 12.5 MG tablet   levothyroxine (SYNTHROID) 88 MCG tablet   Menthol, Topical Analgesic, (BIOFREEZE ROLL-ON EX)   Misc Natural Products (OSTEO BI-FLEX ADV TRIPLE ST) TABS   Multiple Vitamins-Minerals (CENTRUM SILVER ADULT 50+) TABS   oxymetazoline (AFRIN) 0.05 % nasal spray   VITAMIN E/D-ALPHA PO   No current facility-administered medications for this encounter.    Myra Gianotti, PA-C Surgical Short Stay/Anesthesiology [facility] Phone [phone] [facility] Phone  [phone] 05/09/2021 3:53 PM

## 2021-05-12 ENCOUNTER — Telehealth: Payer: Self-pay

## 2021-05-12 NOTE — Telephone Encounter (Signed)
LVM stating pt's recent Bone Density test results.  Pt has osteopenia in her forearm but nothing to be alarmed about.  Dr. Burr Medico recommends the pt starts taking Vitamin D and Calcium oral supplements which can be bought over the counter at any local pharmacy.  Instructed pt to contact Dr. Ernestina Penna office should she have additional questions or concerns regarding her test results.

## 2021-05-16 ENCOUNTER — Other Ambulatory Visit: Payer: Self-pay

## 2021-05-16 DIAGNOSIS — Z8673 Personal history of transient ischemic attack (TIA), and cerebral infarction without residual deficits: Secondary | ICD-10-CM

## 2021-05-16 DIAGNOSIS — C50912 Malignant neoplasm of unspecified site of left female breast: Secondary | ICD-10-CM | POA: Diagnosis not present

## 2021-05-16 DIAGNOSIS — I10 Essential (primary) hypertension: Secondary | ICD-10-CM

## 2021-05-16 MED ORDER — HYDROCHLOROTHIAZIDE 12.5 MG PO TABS: 12.5000 mg | ORAL_TABLET | Freq: Every day | ORAL | 3 refills | Status: DC

## 2021-05-17 ENCOUNTER — Ambulatory Visit (HOSPITAL_COMMUNITY)
Admission: RE | Admit: 2021-05-17 | Discharge: 2021-05-17 | Disposition: A | Discharge status: Home / Self Care | Payer: Medicare HMO | Attending: Surgery | Admitting: Surgery

## 2021-05-17 ENCOUNTER — Encounter (HOSPITAL_COMMUNITY): Payer: Self-pay | Admitting: Surgery

## 2021-05-17 ENCOUNTER — Ambulatory Visit (HOSPITAL_COMMUNITY): Payer: Medicare HMO | Admitting: Physician Assistant

## 2021-05-17 ENCOUNTER — Encounter (HOSPITAL_COMMUNITY)
Admission: RE | Disposition: A | Discharge status: Home / Self Care | Payer: Self-pay | Source: Home / Self Care | Attending: Surgery

## 2021-05-17 DIAGNOSIS — C50512 Malignant neoplasm of lower-outer quadrant of left female breast: Secondary | ICD-10-CM | POA: Diagnosis not present

## 2021-05-17 DIAGNOSIS — Z170 Estrogen receptor positive status [ER+]: Secondary | ICD-10-CM | POA: Insufficient documentation

## 2021-05-17 DIAGNOSIS — I080 Rheumatic disorders of both mitral and aortic valves: Secondary | ICD-10-CM | POA: Insufficient documentation

## 2021-05-17 DIAGNOSIS — E039 Hypothyroidism, unspecified: Secondary | ICD-10-CM | POA: Diagnosis not present

## 2021-05-17 DIAGNOSIS — N6012 Diffuse cystic mastopathy of left breast: Secondary | ICD-10-CM | POA: Diagnosis not present

## 2021-05-17 DIAGNOSIS — N6092 Unspecified benign mammary dysplasia of left breast: Secondary | ICD-10-CM | POA: Diagnosis not present

## 2021-05-17 DIAGNOSIS — G8918 Other acute postprocedural pain: Secondary | ICD-10-CM | POA: Diagnosis not present

## 2021-05-17 DIAGNOSIS — E119 Type 2 diabetes mellitus without complications: Secondary | ICD-10-CM | POA: Insufficient documentation

## 2021-05-17 DIAGNOSIS — C50912 Malignant neoplasm of unspecified site of left female breast: Secondary | ICD-10-CM | POA: Diagnosis not present

## 2021-05-17 HISTORY — PX: BREAST LUMPECTOMY WITH RADIOACTIVE SEED AND SENTINEL LYMPH NODE BIOPSY: SHX6550

## 2021-05-17 LAB — GLUCOSE, CAPILLARY
Glucose-Capillary: 144 mg/dL — ABNORMAL HIGH (ref 70–99)
Glucose-Capillary: 154 mg/dL — ABNORMAL HIGH (ref 70–99)

## 2021-05-17 SURGERY — BREAST LUMPECTOMY WITH RADIOACTIVE SEED AND SENTINEL LYMPH NODE BIOPSY
Anesthesia: General | Site: Breast | Laterality: Left

## 2021-05-17 MED ORDER — CHLORHEXIDINE GLUCONATE CLOTH 2 % EX PADS: 6.0000 | MEDICATED_PAD | Freq: Once | CUTANEOUS | Status: DC

## 2021-05-17 MED ORDER — LACTATED RINGERS IV SOLN: INTRAVENOUS | Status: DC

## 2021-05-17 MED ORDER — CEFAZOLIN SODIUM-DEXTROSE 2-4 GM/100ML-% IV SOLN
2.0000 g | INTRAVENOUS | Status: AC
  Administered 2021-05-17: 2 g via INTRAVENOUS
  Filled 2021-05-17: qty 100

## 2021-05-17 MED ORDER — ORAL CARE MOUTH RINSE: 15.0000 mL | Freq: Once | OROMUCOSAL | Status: AC

## 2021-05-17 MED ORDER — ONDANSETRON HCL 4 MG/2ML IJ SOLN
INTRAMUSCULAR | Status: AC
  Filled 2021-05-17: qty 2

## 2021-05-17 MED ORDER — BUPIVACAINE HCL (PF) 0.25 % IJ SOLN
INTRAMUSCULAR | Status: AC
  Filled 2021-05-17: qty 30

## 2021-05-17 MED ORDER — PHENYLEPHRINE HCL-NACL 20-0.9 MG/250ML-% IV SOLN
INTRAVENOUS | Status: DC | PRN
  Administered 2021-05-17: 50 ug/min via INTRAVENOUS

## 2021-05-17 MED ORDER — FENTANYL CITRATE (PF) 100 MCG/2ML IJ SOLN
INTRAMUSCULAR | Status: AC
  Administered 2021-05-17: 100 ug via INTRAVENOUS
  Filled 2021-05-17: qty 2

## 2021-05-17 MED ORDER — 0.9 % SODIUM CHLORIDE (POUR BTL) OPTIME
TOPICAL | Status: DC | PRN
  Administered 2021-05-17: 200 mL

## 2021-05-17 MED ORDER — EPHEDRINE SULFATE 50 MG/ML IJ SOLN
INTRAMUSCULAR | Status: DC | PRN
  Administered 2021-05-17 (×2): 10 mg via INTRAVENOUS

## 2021-05-17 MED ORDER — CHLORHEXIDINE GLUCONATE 0.12 % MT SOLN
15.0000 mL | Freq: Once | OROMUCOSAL | Status: AC
  Administered 2021-05-17: 15 mL via OROMUCOSAL
  Filled 2021-05-17: qty 15

## 2021-05-17 MED ORDER — BUPIVACAINE HCL (PF) 0.25 % IJ SOLN
INTRAMUSCULAR | Status: DC | PRN
  Administered 2021-05-17: 26 mL

## 2021-05-17 MED ORDER — ROPIVACAINE HCL 5 MG/ML IJ SOLN
INTRAMUSCULAR | Status: DC | PRN
  Administered 2021-05-17 (×6): 5 mL via PERINEURAL

## 2021-05-17 MED ORDER — PROPOFOL 10 MG/ML IV BOLUS
INTRAVENOUS | Status: DC | PRN
  Administered 2021-05-17: 20 mg via INTRAVENOUS
  Administered 2021-05-17: 120 mg via INTRAVENOUS
  Administered 2021-05-17: 20 mg via INTRAVENOUS

## 2021-05-17 MED ORDER — CLONIDINE HCL (ANALGESIA) 100 MCG/ML EP SOLN
EPIDURAL | Status: DC | PRN
  Administered 2021-05-17: 100 ug

## 2021-05-17 MED ORDER — LIDOCAINE 2% (20 MG/ML) 5 ML SYRINGE
INTRAMUSCULAR | Status: AC
  Filled 2021-05-17: qty 5

## 2021-05-17 MED ORDER — METHYLENE BLUE 0.5 % INJ SOLN
INTRAVENOUS | Status: AC
  Filled 2021-05-17: qty 10

## 2021-05-17 MED ORDER — HEMOSTATIC AGENTS (NO CHARGE) OPTIME
TOPICAL | Status: DC | PRN
  Administered 2021-05-17: 1 via TOPICAL

## 2021-05-17 MED ORDER — HYDROCODONE-ACETAMINOPHEN 5-325 MG PO TABS: 1.0000 | ORAL_TABLET | Freq: Four times a day (QID) | ORAL | 0 refills | Status: DC | PRN

## 2021-05-17 MED ORDER — DEXAMETHASONE SODIUM PHOSPHATE 10 MG/ML IJ SOLN
INTRAMUSCULAR | Status: DC | PRN
  Administered 2021-05-17: 10 mg

## 2021-05-17 MED ORDER — SODIUM CHLORIDE (PF) 0.9 % IJ SOLN
INTRAMUSCULAR | Status: AC
  Filled 2021-05-17: qty 10

## 2021-05-17 MED ORDER — FENTANYL CITRATE (PF) 100 MCG/2ML IJ SOLN: 100.0000 ug | Freq: Once | INTRAMUSCULAR | Status: AC

## 2021-05-17 MED ORDER — MAGTRACE LYMPHATIC TRACER
INTRAMUSCULAR | Status: DC | PRN
  Administered 2021-05-17: 2 mL via INTRAMUSCULAR

## 2021-05-17 MED ORDER — ONDANSETRON HCL 4 MG/2ML IJ SOLN
INTRAMUSCULAR | Status: DC | PRN
  Administered 2021-05-17: 4 mg via INTRAVENOUS

## 2021-05-17 MED ORDER — LIDOCAINE 2% (20 MG/ML) 5 ML SYRINGE
INTRAMUSCULAR | Status: DC | PRN
  Administered 2021-05-17: 40 mg via INTRAVENOUS

## 2021-05-17 SURGICAL SUPPLY — 39 items
APPLIER CLIP 9.375 MED OPEN (MISCELLANEOUS) ×3
BAG COUNTER SPONGE SURGICOUNT (BAG) ×3 IMPLANT
BINDER BREAST LRG (GAUZE/BANDAGES/DRESSINGS) IMPLANT
BINDER BREAST XLRG (GAUZE/BANDAGES/DRESSINGS) ×3 IMPLANT
CANISTER SUCT 3000ML PPV (MISCELLANEOUS) ×3 IMPLANT
CHLORAPREP W/TINT 26 (MISCELLANEOUS) ×6 IMPLANT
CLIP APPLIE 9.375 MED OPEN (MISCELLANEOUS) ×2 IMPLANT
COVER PROBE W GEL 5X96 (DRAPES) ×6 IMPLANT
COVER SURGICAL LIGHT HANDLE (MISCELLANEOUS) ×3 IMPLANT
DERMABOND ADVANCED (GAUZE/BANDAGES/DRESSINGS) ×2
DERMABOND ADVANCED .7 DNX12 (GAUZE/BANDAGES/DRESSINGS) ×4 IMPLANT
DEVICE DUBIN SPECIMEN MAMMOGRA (MISCELLANEOUS) ×3 IMPLANT
DRAPE CHEST BREAST 15X10 FENES (DRAPES) ×3 IMPLANT
ELECT CAUTERY BLADE 6.4 (BLADE) IMPLANT
ELECT REM PT RETURN 9FT ADLT (ELECTROSURGICAL) ×3
ELECTRODE REM PT RTRN 9FT ADLT (ELECTROSURGICAL) ×2 IMPLANT
GLOVE SRG 8 PF TXTR STRL LF DI (GLOVE) ×4 IMPLANT
GLOVE SURG ENC MOIS LTX SZ8 (GLOVE) ×6 IMPLANT
GLOVE SURG UNDER POLY LF SZ8 (GLOVE) ×6
GOWN STRL REUS W/ TWL LRG LVL3 (GOWN DISPOSABLE) ×2 IMPLANT
GOWN STRL REUS W/ TWL XL LVL3 (GOWN DISPOSABLE) ×4 IMPLANT
GOWN STRL REUS W/TWL LRG LVL3 (GOWN DISPOSABLE) ×3
GOWN STRL REUS W/TWL XL LVL3 (GOWN DISPOSABLE) ×6
HEMOSTAT ARISTA ABSORB 3G PWDR (HEMOSTASIS) ×3 IMPLANT
KIT BASIN OR (CUSTOM PROCEDURE TRAY) ×3 IMPLANT
KIT MARKER MARGIN INK (KITS) ×3 IMPLANT
LIGHT WAVEGUIDE WIDE FLAT (MISCELLANEOUS) IMPLANT
NEEDLE HYPO 25GX1X1/2 BEV (NEEDLE) ×6 IMPLANT
NS IRRIG 1000ML POUR BTL (IV SOLUTION) ×3 IMPLANT
PACK GENERAL/GYN (CUSTOM PROCEDURE TRAY) ×3 IMPLANT
SUT MNCRL AB 4-0 PS2 18 (SUTURE) ×6 IMPLANT
SUT SILK 2 0 SH (SUTURE) ×3 IMPLANT
SUT VIC AB 2-0 SH 27 (SUTURE)
SUT VIC AB 2-0 SH 27XBRD (SUTURE) IMPLANT
SUT VIC AB 3-0 SH 27 (SUTURE)
SUT VIC AB 3-0 SH 27X BRD (SUTURE) IMPLANT
SUT VIC AB 3-0 SH 8-18 (SUTURE) ×6 IMPLANT
SYR CONTROL 10ML LL (SYRINGE) ×6 IMPLANT
TOWEL GREEN STERILE FF (TOWEL DISPOSABLE) IMPLANT

## 2021-05-17 NOTE — Op Note (Signed)
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  The more lateral clip was dislodged and sent in a separate container.  There were 2 seeds total.  The mass was easily palpable measuring grossly 7 cm in maximal diameter.  All margins were excised along with the mass oriented with ink as the specimen was not sent separately.  And there were 3 left axillary sentinel lymph nodes that mapped out that were removed  Drains: None  EBL: 40 cc  IV fluids: Per anesthesia record  Indications for procedure: The patient is an 80 year old female with a large left lower outer quadrant breast cancer.  She opted for lumpectomy due to her large breast size.  She underwent bracketed little seed placement yesterday around the mass lesion.  The area of distortion measured 7 cm but there is some ambiguity with this.  She was not going to tolerate MRI but 1 to proceed with lumpectomy understanding she may need a mastectomy if this fails.  She understands risk and benefits.The procedure has been discussed with the patient. Alternatives to surgery have been discussed with the patient.  Risks of surgery include bleeding,  Infection,  Seroma formation, death,  and the need for further surgery.   The patient understands and wishes to proceed. Sentinel lymph node mapping and dissection has been discussed with the patient.  Risk of bleeding,  Infection,  Seroma formation,  Additional procedures,,  Shoulder weakness ,  Shoulder stiffness,  Nerve and blood vessel injury and reaction to the mapping dyes have been discussed.  Alternatives to surgery have been  discussed with the patient.  The patient agrees to proceed.    Description of procedure: The patient was seen in the lung area and questions were answered.  Left breast was marked as correct site.  She underwent pectoral block.  She was then taken back to the operative room.  She was placed supine upon the OR table.  After induction of general esthesia, 4 cc of mag trace were injected under the left nipple and massaged for 5 minutes.  Timeout was done prior to this.  She was then prepped and draped in sterile fashion and timeout was performed for second time.  Proper patient, site and procedure verified.  She had 2 seeds and films were available for review as well as nodes.  A posterior sheath could not be placed due to her breast configuration.  I discussed this with the radiologist preoperative.  She had 2 seeds on the lateral medial side of this mass which measured anywhere from 3 to 7 cm.  An incision was made over both seed signals.  We dissected down the large mass measuring about 7 cm was encountered.  I excised the entire mass in its entirety.  The more lateral seed became dislodged and was removed separately.  This was taken all the way back to the pectoralis fashion.  I then took of image using the Faxitron of the mass which showed the seed and clip but not the second seed.  This was in the second container and a separate image was taken of that.  Background counts were 0 upon  inspection of the breast.  I then excised all the Myotte margins widely and sent the separately after orientation with ink.  Irrigation was used and hemostasis achieved.  Local anesthetic was infiltrated throughout the cavity.  Cavity was then closed in multiple layers using 3-0 Vicryl.  4 Monocryl was used to close the skin in a subcuticular fashion.  The probe was then used to identify a hot left axillary node.  Incision was made along the inferior border of the left axillary hairline.  Dissection was carried down into the  level 1 contents.  Probe was used to identify the symptomatic.  There were 3 sentinel nodes identified using the symptomatic probe.  Background counts calibrated to baseline.  These 3 nodes were removed and passed off the field.  Hemostasis achieved with cautery and Arista.  Prior to this is the irrigation was used and suctioned until clear.  Long thoracic nerve, thoracodorsal trunk and axillary vein were all preserved.  Once hemostasis was achieved, the wound was closed with a deep layer of 3-0 Vicryl and subsequent 4-0 Monocryl.  Dermabond applied.  All counts found to be correct.  Breast binder placed.  Patient was awoke extubated and taken to PACU in stable condition.  Images  were taken of the Faxitron image  and placed in the medical record of her specimen as well as seeds removed.

## 2021-05-17 NOTE — Discharge Instructions (Signed)
[facility] Office Phone Number [phone]  BREAST BIOPSY/ PARTIAL MASTECTOMY: POST OP INSTRUCTIONS  Always review your discharge instruction sheet given to you by the facility where your surgery was performed.  IF YOU HAVE DISABILITY OR FAMILY LEAVE FORMS, YOU MUST BRING THEM TO THE OFFICE FOR PROCESSING.  DO NOT GIVE THEM TO YOUR DOCTOR.  A prescription for pain medication may be given to you upon discharge.  Take your pain medication as prescribed, if needed.  If narcotic pain medicine is not needed, then you may take acetaminophen (Tylenol) or ibuprofen (Advil) as needed. Take your usually prescribed medications unless otherwise directed If you need a refill on your pain medication, please contact your pharmacy.  They will contact our office to request authorization.  Prescriptions will not be filled after 5pm or on week-ends. You should eat very light the first 24 hours after surgery, such as soup, crackers, pudding, etc.  Resume your normal diet the day after surgery. Most patients will experience some swelling and bruising in the breast.  Ice packs and a good support bra will help.  Swelling and bruising can take several days to resolve.  It is common to experience some constipation if taking pain medication after surgery.  Increasing fluid intake and taking a stool softener will usually help or prevent this problem from occurring.  A mild laxative (Milk of Magnesia or Miralax) should be taken according to package directions if there are no bowel movements after 48 hours. Unless discharge instructions indicate otherwise, you may remove your bandages 24-48 hours after surgery, and you may shower at that time.  You may have steri-strips (small skin tapes) in place directly over the incision.  These strips should be left on the skin for 7-10 days.  If your surgeon used skin glue on the incision, you may shower in 24 hours.  The glue will flake off over the next 2-3 weeks.  Any  sutures or staples will be removed at the office during your follow-up visit. ACTIVITIES:  You may resume regular daily activities (gradually increasing) beginning the next day.  Wearing a good support bra or sports bra minimizes pain and swelling.  You may have sexual intercourse when it is comfortable. You may drive when you no longer are taking prescription pain medication, you can comfortably wear a seatbelt, and you can safely maneuver your car and apply brakes. RETURN TO WORK:  ______________________________________________________________________________________ You should see your doctor in the office for a follow-up appointment approximately two weeks after your surgery.  Your doctor's nurse will typically make your follow-up appointment when she calls you with your pathology report.  Expect your pathology report 2-3 business days after your surgery.  You may call to check if you do not hear from us after three days. OTHER INSTRUCTIONS: _______________________________________________________________________________________________ _____________________________________________________________________________________________________________________________________ _____________________________________________________________________________________________________________________________________ _____________________________________________________________________________________________________________________________________  WHEN TO CALL YOUR DOCTOR: Fever over 101.0 Nausea and/or vomiting. Extreme swelling or bruising. Continued bleeding from incision. Increased pain, redness, or drainage from the incision.  The clinic staff is available to answer your questions during regular business hours.  Please don't hesitate to call and ask to speak to one of the nurses for clinical concerns.  If you have a medical emergency, go to the nearest emergency room or call 911.  A surgeon from [facility] is always on call at the hospital.  For further questions, please visit [email or web address]   

## 2021-05-17 NOTE — Anesthesia Procedure Notes (Signed)
Procedure Name: LMA Insertion Date/Time: 05/17/2021 2:40 PM Performed by: Lavell Luster, CRNA Pre-anesthesia Checklist: Patient identified, Emergency Drugs available, Suction available, Patient being monitored and Timeout performed Patient Re-evaluated:Patient Re-evaluated prior to induction Oxygen Delivery Method: Circle system utilized Preoxygenation: Pre-oxygenation with 100% oxygen Induction Type: IV induction LMA: LMA flexible inserted LMA Size: 4.0 Placement Confirmation: breath sounds checked- equal and bilateral and positive ETCO2 Tube secured with: Tape Dental Injury: Teeth and Oropharynx as per pre-operative assessment

## 2021-05-17 NOTE — Anesthesia Postprocedure Evaluation (Signed)
Anesthesia Post Note  Patient: Yvonne [name]  Procedure(s) Performed: LEFT BREAST SEED LOCALIZED LUMPECTOMY (BRACKETED) WITH SENTINEL LYMPH NODE BIOPSY (Left: Breast)     Patient location during evaluation: PACU Anesthesia Type: General Level of consciousness: awake Pain management: pain level controlled Vital Signs Assessment: post-procedure vital signs reviewed and stable Respiratory status: spontaneous breathing Cardiovascular status: stable Postop Assessment: no apparent nausea or vomiting Anesthetic complications: no   No notable events documented.  Last Vitals:  Vitals:   05/17/21 1405 05/17/21 1653  BP:  (!) 103/46  Pulse: 68 75  Resp: (!) 9 17  Temp:  36.5 C  SpO2: 95% 92%    Last Pain:  Vitals:   05/17/21 1653  TempSrc:   PainSc: Asleep                 Santino Kinsella

## 2021-05-17 NOTE — Interval H&P Note (Signed)
History and Physical Interval Note:  05/17/2021 2:34 PM  Yvonne [name]  has presented today for surgery, with the diagnosis of malignant neoplasm left breast.  The various methods of treatment have been discussed with the patient and family. After consideration of risks, benefits and other options for treatment, the patient has consented to  Procedure(s) with comments: LEFT BREAST SEED LUMPECTOMY (BRACKETED) (Left) - PEC BLOCK 90 MINUTES ROOM 9 RADIOACTIVE SEED GUIDED AXILLARY SENTINEL LYMPH NODE (Left) as a surgical intervention.  The patient's history has been reviewed, patient examined, no change in status, stable for surgery.  I have reviewed the patient's chart and labs.  Questions were answered to the patient's satisfaction.     [place]

## 2021-05-17 NOTE — Transfer of Care (Signed)
Immediate Anesthesia Transfer of Care Note  Patient: Yvonne [name]  Procedure(s) Performed: LEFT BREAST SEED LOCALIZED LUMPECTOMY (BRACKETED) WITH SENTINEL LYMPH NODE BIOPSY (Left: Breast)  Patient Location: PACU  Anesthesia Type:General  Level of Consciousness: awake, alert  and sedated  Airway & Oxygen Therapy: Patient connected to face mask oxygen  Post-op Assessment: Post -op Vital signs reviewed and stable  Post vital signs: stable  Last Vitals:  Vitals Value Taken Time  BP 103/46 05/17/21 1653  Temp    Pulse 78 05/17/21 1654  Resp 23 05/17/21 1654  SpO2 91 % 05/17/21 1654  Vitals shown include unvalidated device data.  Last Pain:  Vitals:   05/17/21 1400  TempSrc:   PainSc: 0-No pain      Patients Stated Pain Goal: 0 (70/48/88 9169)  Complications: No notable events documented.

## 2021-05-17 NOTE — H&P (Signed)
History of Present Illness: Yvonne [name] is a 80 y.o. female who is seen today as an office consultation at the request of Dr. Pasty Arch for evaluation of Breast Cancer .   Patient seen today in the multidisciplinary breast clinic for left breast cancer. She had an area that she felt in her lower breasts. She underwent imaging which showed a 3.7 cm area up to a 7 cm area of distortion left breast lower outer quadrant. 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 She is being worked up for a valvular problem by her cardiologist. She is independent but does occasionally use a walker at home to get around. She is able to take care of her self. She is able to feed herself. No family history of breast cancer.  Review of Systems: A complete review of systems was obtained from the patient. I have reviewed this information and discussed as appropriate with the patient. See HPI as well for other ROS.    Medical History: Past Medical History:  Diagnosis Date   Aneurysm (CMS-HCC)   Anxiety   Arthritis   History of stroke   Hyperlipidemia   Thyroid disease   Patient Active Problem List  Diagnosis   Malignant neoplasm of lower-outer quadrant of left breast of female, estrogen receptor positive (CMS-HCC)   Past Surgical History:  Procedure Laterality Date   AMPUTATION TOE N/A  2013   ARTHROPLASTY HIP TOTAL Left 03/19/2017   HYSTERECTOMY N/A  abdominal hysterectomy, date unknown   TONSILLECTOMY N/A  Date Unknown    No Known Allergies  Current Outpatient Medications on File Prior to Visit  Medication Sig Dispense Refill   amitriptyline (ELAVIL) 50 MG tablet   amLODIPine (NORVASC) 5 MG tablet   aspirin 81 MG chewable tablet Take by mouth   atorvastatin (LIPITOR) 20 MG tablet   benazepriL (LOTENSIN) 40 MG tablet   gabapentin (NEURONTIN) 100 MG capsule   hydroCHLOROthiazide (HYDRODIURIL) 12.5 MG tablet   levothyroxine (SYNTHROID) 88  MCG tablet   VITAMIN B COMPLEX ORAL Take 1 tablet by mouth once daily   No current facility-administered medications on file prior to visit.   Family History  Problem Relation Age of Onset   High blood pressure (Hypertension) Mother   Hyperlipidemia (Elevated cholesterol) Mother   Coronary Artery Disease (Blocked arteries around heart) Father   High blood pressure (Hypertension) Father   Hyperlipidemia (Elevated cholesterol) Father   High blood pressure (Hypertension) Sister   Hyperlipidemia (Elevated cholesterol) Sister    Social History   Tobacco Use  Smoking Status Never  Smokeless Tobacco Never    Social History   Socioeconomic History   Marital status: Unknown  Tobacco Use   Smoking status: Never   Smokeless tobacco: Never  Substance and Sexual Activity   Alcohol use: Not Currently   Drug use: Not Currently   Objective:  There were no vitals filed for this visit.  There is no height or weight on file to calculate BMI.  Physical Exam Constitutional:  Appearance: Normal appearance.  Eyes:  General: No scleral icterus. Pupils: Pupils are equal, round, and reactive to light.  Cardiovascular:  Rate and Rhythm: Normal rate.  Pulmonary:  Effort: Pulmonary effort is normal.  Breath sounds: No stridor.  Chest:  Breasts: Right: Normal. No mass.   Musculoskeletal:  Cervical back: Normal range of motion and neck supple.  Comments: Mild instability with gait. She does not require though any assistance.  Lymphadenopathy:  Upper Body:  Right upper body: No supraclavicular or axillary adenopathy.  Left upper body: No supraclavicular or axillary adenopathy.  Skin: General: Skin is warm and dry.  Neurological:  General: No focal deficit present.  Mental Status: She is alert and oriented to person, place, and time.  Psychiatric:  Mood and Affect: Mood normal.  Behavior: Behavior normal.     Labs, Imaging and Diagnostic Testing: Pathology shows invasive ductal  carcinoma ER positive, PR positive, HER2/neu negative, with a Ki-67 of 5%. Left breast lower outer quadrant shows a 3.7 cm mass up to a 7 cm area of irregularity.  Assessment and Plan:  Diagnoses and all orders for this visit:  Malignant neoplasm of lower-outer quadrant of left breast of female, estrogen receptor positive (CMS-HCC)    *Discussed breast conserving surgery versus mastectomy with or without reconstruction. Discussed the role of sentinel lymph node mapping and the role of lymphedema plays. She was seen by physical therapy today as well. Given her overall health, breast conserving surgery would be the easiest for her to recover from with the same oncological outcome. She has been seen by cardiology and will await cardiac clearance. Plan is for left seed localized lumpectomy using a bracketed approach with left axillary sentinel lymph node mapping using Sentimag tracer.The procedure has been discussed with the patient. Alternatives to surgery have been discussed with the patient. Risks of surgery include bleeding, Infection, lymphedema, numbness, pain, arm swelling, decreased range of motion shoulder, and the need further treatments, reexcision or procedures. Also seroma formation, death, and the need for further surgery. The patient understands and wishes to proceed.  No follow-ups on file.  Kennieth Francois, MD

## 2021-05-17 NOTE — Anesthesia Procedure Notes (Signed)
Anesthesia Regional Block: Pectoralis block   Pre-Anesthetic Checklist: , timeout performed,  Correct Patient, Correct Site, Correct Laterality,  Correct Procedure, Correct Position, site marked,  Risks and benefits discussed,  Surgical consent,  Pre-op evaluation,  At surgeon's request and post-op pain management  Laterality: Left and N/A  Prep: chloraprep       Needles:  Injection technique: Single-shot  Needle Type: Echogenic Stimulator Needle     Needle Length: 9cm  Needle Gauge: 20   Needle insertion depth: 2 cm   Additional Needles:   Procedures:,,,, ultrasound used (permanent image in chart),,    Narrative:  Start time: 05/17/2021 1:45 PM End time: 05/17/2021 1:55 PM Injection made incrementally with aspirations every 5 mL.  Performed by: Personally  Anesthesiologist: Lyn Hollingshead, MD

## 2021-05-18 ENCOUNTER — Encounter: Payer: Self-pay | Admitting: Family Medicine

## 2021-05-18 ENCOUNTER — Encounter (HOSPITAL_COMMUNITY): Payer: Self-pay | Admitting: Surgery

## 2021-05-23 ENCOUNTER — Telehealth: Payer: Self-pay

## 2021-05-23 NOTE — Telephone Encounter (Signed)
Pt calling to schedule an appt with Dr. Owens Shark. Pt just had breast surgery a week ago or so and would like to see Dr. Owens Shark. Unfortunately pt can only make a pm appt. Dr. Owens Shark does not have any open afternoon spots. Pt would like to know if Dr. Owens Shark can open a spot for pt in the pm. Ottis Stain, [place]

## 2021-05-23 NOTE — Telephone Encounter (Signed)
Attempted to call patient. I cannot open a clinic to see her in the PM this week--my schedule is unfortunately full. If she has concerns, please schedule in ATC if patient needs to be seen. If patient calls back, please identify a good time to call her.  Dorris Singh, MD  Family Medicine Teaching Service

## 2021-05-25 ENCOUNTER — Encounter: Payer: Self-pay | Admitting: *Deleted

## 2021-05-25 LAB — SURGICAL PATHOLOGY

## 2021-05-25 NOTE — Telephone Encounter (Signed)
LVM for patient to call office to make appointment.  Yvonne [name], [place]

## 2021-05-29 ENCOUNTER — Encounter: Payer: Self-pay | Admitting: *Deleted

## 2021-05-29 ENCOUNTER — Ambulatory Visit: Payer: Self-pay | Admitting: Surgery

## 2021-05-29 ENCOUNTER — Ambulatory Visit: Payer: Medicare HMO | Admitting: Family Medicine

## 2021-05-29 NOTE — Progress Notes (Signed)
ROOM DURING VISITING HOURS ONLY!                MARIONNA GONIA  05/29/2021   Your procedure is scheduled on:  06/08/21   Report to [facility] Main  Entrance   Report to admitting at    1015AM      Call this number if you have problems the morning of surgery [phone]    Remember: Do not eat food , candy gum or mints :After Midnight. You may have clear liquids from midnight until __ 0930AM    CLEAR LIQUID DIET   Foods Allowed                                                                       Coffee and tea, regular and decaf                              Plain Jell-O any favor except red or purple                                            Fruit ices (not with fruit pulp)                                      Iced Popsicles                                     Carbonated beverages, regular and diet                                    Cranberry, grape and apple juices Sports drinks like Gatorade Lightly seasoned clear broth or consume(fat free) Sugar   _____________________________________________________________________    BRUSH YOUR TEETH MORNING OF SURGERY AND RINSE YOUR MOUTH OUT, NO CHEWING GUM CANDY OR MINTS.     Take these medicines the morning of surgery with A SIP OF WATER:  GABAPENITN, SYNTHROID   DO NOT TAKE ANY DIABETIC MEDICATIONS DAY OF YOUR SURGERY                               You may not have any metal on your body including hair pins and              piercings  Do not wear jewelry, make-up, lotions, powders or perfumes, deodorant             Do not wear nail polish on your fingernails.  Do not shave  48 hours prior to surgery.              Men may shave face and neck.   Do not bring valuables to the hospital. [place]  VALUABLES.  Contacts, dentures or bridgework may not be worn into surgery.  Leave suitcase in the car. After surgery it may be brought to your room.     Patients discharged the  day of surgery will not be allowed to drive home. IF YOU ARE HAVING SURGERY AND GOING HOME THE SAME DAY, YOU MUST HAVE AN ADULT TO DRIVE YOU HOME AND BE WITH YOU FOR 24 HOURS. YOU MAY GO HOME BY TAXI OR UBER OR ORTHERWISE, BUT AN ADULT MUST ACCOMPANY YOU HOME AND STAY WITH YOU FOR 24 HOURS.  Name and phone number of your driver:  Special Instructions: N/A              Please read over the following fact sheets you were given: _____________________________________________________________________  [facility] - Preparing for Surgery Before surgery, you can play an important role.  Because skin is not sterile, your skin needs to be as free of germs as possible.  You can reduce the number of germs on your skin by washing with CHG (chlorahexidine gluconate) soap before surgery.  CHG is an antiseptic cleaner which kills germs and bonds with the skin to continue killing germs even after washing. Please DO NOT use if you have an allergy to CHG or antibacterial soaps.  If your skin becomes reddened/irritated stop using the CHG and inform your nurse when you arrive at Short Stay. Do not shave (including legs and underarms) for at least 48 hours prior to the first CHG shower.  You may shave your face/neck. Please follow these instructions carefully:  1.  Shower with CHG Soap the night before surgery and the  morning of Surgery.  2.  If you choose to wash your hair, wash your hair first as usual with your  normal  shampoo.  3.  After you shampoo, rinse your hair and body thoroughly to remove the  shampoo.                           4.  Use CHG as you would any other liquid soap.  You can apply chg directly  to the skin and wash                       Gently with a scrungie or clean washcloth.  5.  Apply the CHG Soap to your body ONLY FROM THE NECK DOWN.   Do not use on face/ open                           Wound or open sores. Avoid contact with eyes, ears mouth and genitals (private parts).                        Wash face,  Genitals (private parts) with your normal soap.             6.  Wash thoroughly, paying special attention to the area where your surgery  will be performed.  7.  Thoroughly rinse your body with warm water from the neck down.  8.  DO NOT shower/wash with your normal soap after using and rinsing off  the CHG Soap.                9.  Pat yourself dry with a clean towel.            10.  Wear clean pajamas.  11.  Place clean sheets on your bed the night of your first shower and do not  sleep with pets. Day of Surgery : Do not apply any lotions/deodorants the morning of surgery.  Please wear clean clothes to the hospital/surgery center.  FAILURE TO FOLLOW THESE INSTRUCTIONS MAY RESULT IN THE CANCELLATION OF YOUR SURGERY PATIENT SIGNATURE_________________________________  NURSE SIGNATURE__________________________________  ________________________________________________________________________

## 2021-05-29 NOTE — Progress Notes (Addendum)
Anesthesia Review:  PCP: DR  Dorris Singh with [facility]  LOV 11/25/20  Cardiologist : DR Angelena Form LOV 12/15/20.  Clearance - 04/27/21  Chest x-ray : EKG :05/09/21  Echo :03/28/21  09/09/20- carotids  Stress test: Cardiac Cath :  Activity level: cannot do a flight of stairs without difficulty  Sleep Study/ CPAP : none  Fasting Blood Sugar :      / Checks Blood Sugar -- times a day:   Blood Thinner/ Instructions /Last Dose: ASA / Instructions/ Last Dose :   05/17/21- left breast seed  81 mg Aspirin  Prediabetes   hgba1C- 05/31/21-  Does not check glucose at home  No covid test- ambulatory surgery  PT has caregiver few hours every day..  PT has some slight memory issues.  Was alert and oriented x 3 at preop.  Wears hearing aids.  Careigver, Jonni Sanger to be with pt day of surgery.  No covid test- ambulatory surgery

## 2021-05-30 NOTE — Telephone Encounter (Signed)
Called patient. She is only able to do PM slots due to transport. Will see if can add to January 10th PM as single slot.  Dorris Singh, MD  Family Medicine Teaching Service

## 2021-05-30 NOTE — Telephone Encounter (Signed)
Confirmed appointment with patient and Jonni Sanger. June 27, 2021 @1400  will be fine.  Ozella Almond, [place]

## 2021-05-30 NOTE — Telephone Encounter (Signed)
Patient scheduled for January 10th PM. Nursing- please call and confirm this time/date works for her. If not, can try to find another. If she does not answer, she asks we call Ms. Jonni Sanger (in contact information).  Dorris Singh, MD  Family Medicine Teaching Service

## 2021-05-31 ENCOUNTER — Encounter (HOSPITAL_COMMUNITY): Payer: Self-pay

## 2021-05-31 ENCOUNTER — Other Ambulatory Visit: Payer: Self-pay

## 2021-05-31 ENCOUNTER — Encounter (HOSPITAL_COMMUNITY)
Admission: RE | Admit: 2021-05-31 | Discharge: 2021-05-31 | Disposition: A | Discharge status: Home / Self Care | Payer: Medicare HMO | Source: Ambulatory Visit | Attending: Surgery | Admitting: Surgery

## 2021-05-31 VITALS — BP 136/87 | HR 70 | Temp 98.3°F | Resp 16 | Ht 61.0 in | Wt 161.0 lb

## 2021-05-31 DIAGNOSIS — Z01818 Encounter for other preprocedural examination: Secondary | ICD-10-CM

## 2021-05-31 DIAGNOSIS — N189 Chronic kidney disease, unspecified: Secondary | ICD-10-CM | POA: Diagnosis not present

## 2021-05-31 DIAGNOSIS — E1122 Type 2 diabetes mellitus with diabetic chronic kidney disease: Secondary | ICD-10-CM | POA: Insufficient documentation

## 2021-05-31 DIAGNOSIS — Z01812 Encounter for preprocedural laboratory examination: Secondary | ICD-10-CM | POA: Insufficient documentation

## 2021-05-31 HISTORY — DX: Prediabetes: R73.03

## 2021-05-31 LAB — SURGICAL PCR SCREEN
MRSA, PCR: NEGATIVE
Staphylococcus aureus: NEGATIVE

## 2021-05-31 LAB — CBC
HCT: 41 % (ref 36.0–46.0)
Hemoglobin: 13.5 g/dL (ref 12.0–15.0)
MCH: 29.9 pg (ref 26.0–34.0)
MCHC: 32.9 g/dL (ref 30.0–36.0)
MCV: 90.7 fL (ref 80.0–100.0)
Platelets: 359 10*3/uL (ref 150–400)
RBC: 4.52 MIL/uL (ref 3.87–5.11)
RDW: 13.8 % (ref 11.5–15.5)
WBC: 11 10*3/uL — ABNORMAL HIGH (ref 4.0–10.5)
nRBC: 0 % (ref 0.0–0.2)

## 2021-05-31 LAB — BASIC METABOLIC PANEL
Anion gap: 9 (ref 5–15)
BUN: 13 mg/dL (ref 8–23)
CO2: 27 mmol/L (ref 22–32)
Calcium: 9.1 mg/dL (ref 8.9–10.3)
Chloride: 100 mmol/L (ref 98–111)
Creatinine, Ser: 0.63 mg/dL (ref 0.44–1.00)
GFR, Estimated: >60 mL/min (ref >60)
Glucose, Bld: 137 mg/dL — ABNORMAL HIGH (ref 70–99)
Potassium: 3.7 mmol/L (ref 3.5–5.1)
Sodium: 136 mmol/L (ref 135–145)

## 2021-05-31 LAB — HEMOGLOBIN A1C
Hgb A1c MFr Bld: 6.8 % — ABNORMAL HIGH (ref 4.8–5.6)
Mean Plasma Glucose: 148.46 mg/dL

## 2021-05-31 LAB — GLUCOSE, CAPILLARY: Glucose-Capillary: 139 mg/dL — ABNORMAL HIGH (ref 70–99)

## 2021-06-08 ENCOUNTER — Ambulatory Visit (HOSPITAL_COMMUNITY): Payer: Medicare HMO | Admitting: Physician Assistant

## 2021-06-08 ENCOUNTER — Ambulatory Visit (HOSPITAL_COMMUNITY)
Admission: RE | Admit: 2021-06-08 | Discharge: 2021-06-08 | Disposition: A | Discharge status: Home / Self Care | Payer: Medicare HMO | Attending: Surgery | Admitting: Surgery

## 2021-06-08 ENCOUNTER — Encounter (HOSPITAL_COMMUNITY)
Admission: RE | Disposition: A | Discharge status: Home / Self Care | Payer: Self-pay | Source: Home / Self Care | Attending: Surgery

## 2021-06-08 ENCOUNTER — Other Ambulatory Visit: Payer: Self-pay

## 2021-06-08 ENCOUNTER — Encounter (HOSPITAL_COMMUNITY): Payer: Self-pay | Admitting: Surgery

## 2021-06-08 DIAGNOSIS — C50412 Malignant neoplasm of upper-outer quadrant of left female breast: Secondary | ICD-10-CM | POA: Insufficient documentation

## 2021-06-08 DIAGNOSIS — Z170 Estrogen receptor positive status [ER+]: Secondary | ICD-10-CM | POA: Diagnosis not present

## 2021-06-08 DIAGNOSIS — M1990 Unspecified osteoarthritis, unspecified site: Secondary | ICD-10-CM | POA: Diagnosis not present

## 2021-06-08 DIAGNOSIS — E039 Hypothyroidism, unspecified: Secondary | ICD-10-CM | POA: Diagnosis not present

## 2021-06-08 DIAGNOSIS — Z8673 Personal history of transient ischemic attack (TIA), and cerebral infarction without residual deficits: Secondary | ICD-10-CM | POA: Diagnosis not present

## 2021-06-08 DIAGNOSIS — I10 Essential (primary) hypertension: Secondary | ICD-10-CM | POA: Insufficient documentation

## 2021-06-08 DIAGNOSIS — E119 Type 2 diabetes mellitus without complications: Secondary | ICD-10-CM | POA: Insufficient documentation

## 2021-06-08 DIAGNOSIS — Z79899 Other long term (current) drug therapy: Secondary | ICD-10-CM | POA: Insufficient documentation

## 2021-06-08 DIAGNOSIS — F418 Other specified anxiety disorders: Secondary | ICD-10-CM | POA: Diagnosis not present

## 2021-06-08 DIAGNOSIS — C50912 Malignant neoplasm of unspecified site of left female breast: Secondary | ICD-10-CM | POA: Diagnosis not present

## 2021-06-08 DIAGNOSIS — F32A Depression, unspecified: Secondary | ICD-10-CM | POA: Diagnosis not present

## 2021-06-08 DIAGNOSIS — F419 Anxiety disorder, unspecified: Secondary | ICD-10-CM | POA: Insufficient documentation

## 2021-06-08 DIAGNOSIS — C50512 Malignant neoplasm of lower-outer quadrant of left female breast: Secondary | ICD-10-CM | POA: Diagnosis not present

## 2021-06-08 HISTORY — PX: RE-EXCISION OF BREAST LUMPECTOMY: SHX6048

## 2021-06-08 LAB — GLUCOSE, CAPILLARY: Glucose-Capillary: 147 mg/dL — ABNORMAL HIGH (ref 70–99)

## 2021-06-08 SURGERY — EXCISION, LESION, BREAST
Anesthesia: General | Site: Breast | Laterality: Left

## 2021-06-08 MED ORDER — BUPIVACAINE-EPINEPHRINE 0.25% -1:200000 IJ SOLN
INTRAMUSCULAR | Status: DC | PRN
  Administered 2021-06-08: 15 mL

## 2021-06-08 MED ORDER — PROPOFOL 10 MG/ML IV BOLUS
INTRAVENOUS | Status: AC
  Filled 2021-06-08: qty 20

## 2021-06-08 MED ORDER — LIDOCAINE 2% (20 MG/ML) 5 ML SYRINGE
INTRAMUSCULAR | Status: DC | PRN
  Administered 2021-06-08: 50 mg via INTRAVENOUS

## 2021-06-08 MED ORDER — OXYCODONE HCL 5 MG PO TABS
ORAL_TABLET | ORAL | Status: AC
  Filled 2021-06-08: qty 1

## 2021-06-08 MED ORDER — AMISULPRIDE (ANTIEMETIC) 5 MG/2ML IV SOLN: 10.0000 mg | Freq: Once | INTRAVENOUS | Status: DC | PRN

## 2021-06-08 MED ORDER — 0.9 % SODIUM CHLORIDE (POUR BTL) OPTIME
TOPICAL | Status: DC | PRN
  Administered 2021-06-08: 11:00:00 1000 mL

## 2021-06-08 MED ORDER — FENTANYL CITRATE (PF) 250 MCG/5ML IJ SOLN
INTRAMUSCULAR | Status: AC
  Filled 2021-06-08: qty 5

## 2021-06-08 MED ORDER — PHENYLEPHRINE HCL-NACL 20-0.9 MG/250ML-% IV SOLN
INTRAVENOUS | Status: DC | PRN
  Administered 2021-06-08: 50 ug/min via INTRAVENOUS

## 2021-06-08 MED ORDER — FENTANYL CITRATE (PF) 100 MCG/2ML IJ SOLN
INTRAMUSCULAR | Status: DC | PRN
  Administered 2021-06-08: 75 ug via INTRAVENOUS

## 2021-06-08 MED ORDER — CHLORHEXIDINE GLUCONATE CLOTH 2 % EX PADS: 6.0000 | MEDICATED_PAD | Freq: Once | CUTANEOUS | Status: DC

## 2021-06-08 MED ORDER — LACTATED RINGERS IV SOLN: INTRAVENOUS | Status: DC

## 2021-06-08 MED ORDER — FENTANYL CITRATE PF 50 MCG/ML IJ SOSY: 25.0000 ug | PREFILLED_SYRINGE | INTRAMUSCULAR | Status: DC | PRN

## 2021-06-08 MED ORDER — HYDROCODONE-ACETAMINOPHEN 5-325 MG PO TABS: 1.0000 | ORAL_TABLET | Freq: Four times a day (QID) | ORAL | 0 refills | Status: DC | PRN

## 2021-06-08 MED ORDER — CEFAZOLIN SODIUM-DEXTROSE 2-4 GM/100ML-% IV SOLN
2.0000 g | INTRAVENOUS | Status: AC
  Administered 2021-06-08: 11:00:00 2 g via INTRAVENOUS
  Filled 2021-06-08: qty 100

## 2021-06-08 MED ORDER — FENTANYL CITRATE (PF) 100 MCG/2ML IJ SOLN
INTRAMUSCULAR | Status: AC
  Filled 2021-06-08: qty 2

## 2021-06-08 MED ORDER — PHENYLEPHRINE HCL (PRESSORS) 10 MG/ML IV SOLN
INTRAVENOUS | Status: DC | PRN
  Administered 2021-06-08 (×2): 80 ug via INTRAVENOUS

## 2021-06-08 MED ORDER — MIDAZOLAM HCL 2 MG/2ML IJ SOLN
INTRAMUSCULAR | Status: AC
  Filled 2021-06-08: qty 2

## 2021-06-08 MED ORDER — ORAL CARE MOUTH RINSE: 15.0000 mL | Freq: Once | OROMUCOSAL | Status: AC

## 2021-06-08 MED ORDER — ONDANSETRON HCL 4 MG/2ML IJ SOLN
INTRAMUSCULAR | Status: DC | PRN
  Administered 2021-06-08: 4 mg via INTRAVENOUS

## 2021-06-08 MED ORDER — IBUPROFEN 800 MG PO TABS: 800.0000 mg | ORAL_TABLET | Freq: Three times a day (TID) | ORAL | 0 refills | Status: DC | PRN

## 2021-06-08 MED ORDER — DEXAMETHASONE SODIUM PHOSPHATE 10 MG/ML IJ SOLN
INTRAMUSCULAR | Status: DC | PRN
  Administered 2021-06-08: 7 mg via INTRAVENOUS

## 2021-06-08 MED ORDER — BUPIVACAINE-EPINEPHRINE (PF) 0.25% -1:200000 IJ SOLN
INTRAMUSCULAR | Status: AC
  Filled 2021-06-08: qty 30

## 2021-06-08 MED ORDER — OXYCODONE HCL 5 MG/5ML PO SOLN: 5.0000 mg | Freq: Once | ORAL | Status: AC | PRN

## 2021-06-08 MED ORDER — ONDANSETRON HCL 4 MG/2ML IJ SOLN
INTRAMUSCULAR | Status: AC
  Filled 2021-06-08: qty 2

## 2021-06-08 MED ORDER — PHENYLEPHRINE HCL (PRESSORS) 10 MG/ML IV SOLN
INTRAVENOUS | Status: AC
  Filled 2021-06-08: qty 2

## 2021-06-08 MED ORDER — CHLORHEXIDINE GLUCONATE 0.12 % MT SOLN
15.0000 mL | Freq: Once | OROMUCOSAL | Status: AC
  Administered 2021-06-08: 10:00:00 15 mL via OROMUCOSAL

## 2021-06-08 MED ORDER — ACETAMINOPHEN 500 MG PO TABS
1000.0000 mg | ORAL_TABLET | Freq: Once | ORAL | Status: AC
  Administered 2021-06-08: 10:00:00 1000 mg via ORAL
  Filled 2021-06-08: qty 2

## 2021-06-08 MED ORDER — OXYCODONE HCL 5 MG PO TABS
5.0000 mg | ORAL_TABLET | Freq: Once | ORAL | Status: AC | PRN
  Administered 2021-06-08: 13:00:00 5 mg via ORAL

## 2021-06-08 MED ORDER — ONDANSETRON HCL 4 MG/2ML IJ SOLN: 4.0000 mg | Freq: Once | INTRAMUSCULAR | Status: DC | PRN

## 2021-06-08 MED ORDER — PROPOFOL 10 MG/ML IV BOLUS
INTRAVENOUS | Status: DC | PRN
  Administered 2021-06-08: 80 mg via INTRAVENOUS

## 2021-06-08 MED ORDER — LIDOCAINE HCL (PF) 2 % IJ SOLN
INTRAMUSCULAR | Status: AC
  Filled 2021-06-08: qty 5

## 2021-06-08 MED ORDER — DEXAMETHASONE SODIUM PHOSPHATE 10 MG/ML IJ SOLN
INTRAMUSCULAR | Status: AC
  Filled 2021-06-08: qty 1

## 2021-06-08 SURGICAL SUPPLY — 34 items
BAG COUNTER SPONGE SURGICOUNT (BAG) IMPLANT
BAG SURGICOUNT SPONGE COUNTING (BAG)
BINDER BREAST XLRG (GAUZE/BANDAGES/DRESSINGS) ×2 IMPLANT
BLADE HEX COATED 2.75 (ELECTRODE) ×3 IMPLANT
BLADE SURG 15 STRL LF DISP TIS (BLADE) ×2 IMPLANT
BLADE SURG 15 STRL SS (BLADE) ×3
BLADE SURG SZ10 CARB STEEL (BLADE) ×3 IMPLANT
DECANTER SPIKE VIAL GLASS SM (MISCELLANEOUS) ×3 IMPLANT
DERMABOND ADVANCED (GAUZE/BANDAGES/DRESSINGS) ×2
DERMABOND ADVANCED .7 DNX12 (GAUZE/BANDAGES/DRESSINGS) IMPLANT
DRAPE LAPAROTOMY TRNSV 102X78 (DRAPES) ×3 IMPLANT
ELECT REM PT RETURN 15FT ADLT (MISCELLANEOUS) ×3 IMPLANT
GAUZE 4X4 16PLY ~~LOC~~+RFID DBL (SPONGE) ×3 IMPLANT
GAUZE SPONGE 4X4 12PLY STRL (GAUZE/BANDAGES/DRESSINGS) ×3 IMPLANT
GLOVE SURG MICRO LTX SZ7.5 (GLOVE) ×3 IMPLANT
GLOVE SURG UNDER LTX SZ8 (GLOVE) ×6 IMPLANT
GLOVE SURG UNDER POLY LF SZ7 (GLOVE) ×3 IMPLANT
GOWN STRL REUS W/TWL LRG LVL3 (GOWN DISPOSABLE) ×3 IMPLANT
GOWN STRL REUS W/TWL XL LVL3 (GOWN DISPOSABLE) ×6 IMPLANT
KIT BASIN OR (CUSTOM PROCEDURE TRAY) ×3 IMPLANT
KIT MARKER MARGIN INK (KITS) ×2 IMPLANT
KIT TURNOVER KIT A (KITS) ×2 IMPLANT
MARKER SKIN DUAL TIP RULER LAB (MISCELLANEOUS) ×3 IMPLANT
NDL HYPO 25X1 1.5 SAFETY (NEEDLE) ×1 IMPLANT
NEEDLE HYPO 22GX1.5 SAFETY (NEEDLE) ×3 IMPLANT
NEEDLE HYPO 25X1 1.5 SAFETY (NEEDLE) ×3 IMPLANT
NS IRRIG 1000ML POUR BTL (IV SOLUTION) ×3 IMPLANT
PACK BASIC VI WITH GOWN DISP (CUSTOM PROCEDURE TRAY) ×3 IMPLANT
PENCIL SMOKE EVACUATOR (MISCELLANEOUS) ×2 IMPLANT
SOL PREP POV-IOD 4OZ 10% (MISCELLANEOUS) ×1 IMPLANT
SUT MNCRL AB 4-0 PS2 18 (SUTURE) ×2 IMPLANT
SUT VIC AB 3-0 SH 18 (SUTURE) ×2 IMPLANT
SYR CONTROL 10ML LL (SYRINGE) ×3 IMPLANT
TOWEL OR 17X26 10 PK STRL BLUE (TOWEL DISPOSABLE) ×3 IMPLANT

## 2021-06-08 NOTE — Anesthesia Procedure Notes (Signed)
Procedure Name: LMA Insertion Date/Time: 06/08/2021 11:10 AM Performed by: Lissa Morales, CRNA Pre-anesthesia Checklist: Patient identified, Emergency Drugs available, Suction available and Patient being monitored Patient Re-evaluated:Patient Re-evaluated prior to induction Oxygen Delivery Method: Circle system utilized Preoxygenation: Pre-oxygenation with 100% oxygen Induction Type: IV induction LMA: LMA with gastric port inserted LMA Size: 4.0 Tube type: Oral Number of attempts: 1 Placement Confirmation: positive ETCO2 Tube secured with: Tape Dental Injury: Teeth and Oropharynx as per pre-operative assessment

## 2021-06-08 NOTE — Op Note (Signed)
Preoperative diagnosis: Left breast cancer  Postoperative diagnosis: Same  Procedure: Left breast reexcision lumpectomy  Surgeon: Erroll Luna, MD  Anesthesia: LMA with 0.25% Marcaine plain  EBL: Minimal  Specimen: Lateral margin from left breast lumpectomy cavity oriented with ink and sent to pathology  Drains: None  Indications for procedure: The patient is an 80 year old female who underwent previous lumpectomy with margin excision for a T3 N0 MX left breast cancer.  Location upper outer quadrant.  She returns today do a microscopic positive margin for reexcision.  Risk, benefits and rationale for surgery discussed with the patient.The procedure has been discussed with the patient. Alternatives to surgery have been discussed with the patient.  Risks of surgery include bleeding,  Infection,  Seroma formation, death,  and the need for further surgery.   The patient understands and wishes to proceed.    Description of procedure: Patient met in the holding area and questions answered.  Left breast marked as correct site and procedure reviewed.  All questions were answered.  She was then brought back to the operative room.  She is placed supine upon the OR table.  After timeout was done in general anesthesia initiated, left breast was prepped and draped in sterile fashion and timeout done again.  Previous incision was opened in the left lateral breast.  Seroma evacuated.  Lateral margin was excised with cautery.  Hemostasis achieved.  Local anesthetic infiltrated throughout the cavity.  After irrigating suctioning out the fluid, it was closed with a deep layer 3-0 Vicryl.  4 Monocryl used to close the skin.  Dermabond applied.  All counts found to be correct.  Breast binder placed.  The patient was awoke extubated taken recovery in satisfactory condition.

## 2021-06-08 NOTE — H&P (Signed)
Yvonne [name] is an 80 y.o. female.   Chief Complaint: RE excision left breast lumpectomy  HPI: Pt presents for re excision left breast lumpectomy for left breast cancer.   Past Medical History:  Diagnosis Date   Anemia    Anxiety    Aortic stenosis    severe by 2021 echo    Arthritis    Bilateral carotid artery disease ([place]) 02/04/2017   Carotid US 3/22: R 1-39; L 100   Depression, recurrent ([place]) 02/10/2019   Hypertension    Hypothyroidism    Impaired functional mobility, balance, gait, and endurance 09/02/2015   Malignant neoplasm of lower-outer quadrant of left breast of female, estrogen receptor positive ([place]) 04/24/2021   Mild cognitive impairment with memory loss 09/02/2015   Osteopenia    Pre-diabetes    Stroke ([place])    pt was 40    Past Surgical History:  Procedure Laterality Date   ABDOMINAL HYSTERECTOMY     BACK SURGERY     2019   BRAIN SURGERY     age 76's   BREAST LUMPECTOMY WITH RADIOACTIVE SEED AND SENTINEL LYMPH NODE BIOPSY Left 05/17/2021   Procedure: LEFT BREAST SEED LOCALIZED LUMPECTOMY (BRACKETED) WITH SENTINEL LYMPH NODE BIOPSY;  Surgeon: Erroll Luna, MD;  Location: [facility];  Service: General;  Laterality: Left;  PEC BLOCK 90 MINUTES ROOM 9   TOE AMPUTATION  2013   2nd toe on right foot, hammer toe   TONSILLECTOMY     TOTAL HIP ARTHROPLASTY Left 03/19/2017   Procedure: LEFT TOTAL HIP ARTHROPLASTY ANTERIOR APPROACH;  Surgeon: Mcarthur Rossetti, MD;  Location: [facility];  Service: Orthopedics;  Laterality: Left;    Family History  Problem Relation Age of Onset   Thyroid disease Mother    Heart disease Father    Social History:  reports that she has never smoked. She has never used smokeless tobacco. She reports that she does not currently use alcohol after a past usage of about 1.0 standard drink per week. She reports that she does not use drugs.  Allergies: No Known Allergies  Medications Prior to Admission  Medication Sig Dispense Refill    acetaminophen (TYLENOL) 325 MG tablet Take 650 mg by mouth every 6 (six) hours as needed for mild pain or headache.     amitriptyline (ELAVIL) 50 MG tablet Take 1 tablet (50 mg total) by mouth at bedtime. 90 tablet 3   amLODipine (NORVASC) 5 MG tablet Take 1 tablet (5 mg total) by mouth at bedtime. (Patient taking differently: Take 5 mg by mouth daily. Pt takes in the am) 90 tablet 3   Ascorbic Acid (VITAMIN C GUMMIE PO) Take 1 tablet by mouth daily.     aspirin 81 MG chewable tablet Chew 1 tablet (81 mg total) by mouth daily. (Patient taking differently: Chew 81 mg by mouth daily. At bedtime) 90 tablet 3   atorvastatin (LIPITOR) 20 MG tablet Take 1 tablet (20 mg total) by mouth daily. 90 tablet 3   benazepril (LOTENSIN) 40 MG tablet Take 1 tablet (40 mg total) by mouth daily. (Patient taking differently: Take 40 mg by mouth daily. Pt takes in the am) 90 tablet 3   diclofenac Sodium (VOLTAREN) 1 % GEL Apply 4 g topically 4 (four) times daily as needed (pain).     gabapentin (NEURONTIN) 100 MG capsule 1 tablet in the morning and 2 at night (Patient taking differently: Take 100-200 mg by mouth See admin instructions. 100mg  in the morning and 200mg   at bedtime) 270 capsule 3   glucose blood (RELION TRUE METRIX TEST STRIPS) test strip Use as instructed 100 each 12   hydrochlorothiazide (HYDRODIURIL) 12.5 MG tablet Take 1 tablet (12.5 mg total) by mouth daily. 90 tablet 3   HYDROcodone-acetaminophen (NORCO/VICODIN) 5-325 MG tablet Take 1 tablet by mouth every 6 (six) hours as needed for moderate pain. 15 tablet 0   levothyroxine (SYNTHROID) 88 MCG tablet Take 1 tablet (88 mcg total) by mouth every morning. 30 minutes before food (Patient taking differently: Take 88 mcg by mouth daily. 30 minutes before food) 90 tablet 3   Menthol, Topical Analgesic, (BIOFREEZE ROLL-ON EX) Apply 1 application topically 3 (three) times daily as needed (back pain).     Misc Natural Products (OSTEO BI-FLEX ADV TRIPLE ST) TABS  Take 1 tablet by mouth daily. 60mg  of Vitamin C  (Ascorbic Acid); 2mg  Manganese (Manganese Sulfate); 35mg  Sodium; 1500mg  Glucosamine HCI; 100mg  5-Loxin (Boswellia Serrata Extract resin); 1103mg  (Chondroitin/MSM Complex (Chrondroitin Sulfate, Methylsulfonylmethane, Collagen (Hydrolyzed Gelatin), Boswellia Serrata (resin); Boron (Bororganic Glycine); Hyaluronic Acid     Multiple Vitamins-Minerals (CENTRUM SILVER ADULT 50+) TABS Take 1 tablet by mouth daily.     oxymetazoline (AFRIN) 0.05 % nasal spray Place 1 spray into both nostrils 2 (two) times daily as needed for congestion.     VITAMIN E/D-ALPHA PO Take 1 tablet by mouth daily.      No results found for this or any previous visit (from the past 48 hour(s)). No results found.  Review of Systems  All other systems reviewed and are negative.  There were no vitals taken for this visit. Physical Exam Constitutional:      Appearance: Normal appearance.  Chest:  Breasts:    Right: Normal.     Left: No bleeding or skin change.       Comments: Incision CDI  Neurological:     Mental Status: She is alert.     Assessment/Plan Left breast cancer  Re excision left breast lumpectomy    The procedure has been discussed with the patient. Alternatives to surgery have been discussed with the patient.  Risks of surgery include bleeding,  Infection,  Seroma formation, death,  and the need for further surgery.   The patient understands and wishes to proceed.      Turner Daniels, MD 06/08/2021, 10:13 AM

## 2021-06-08 NOTE — Interval H&P Note (Signed)
History and Physical Interval Note:  06/08/2021 10:15 AM  Yvonne [name]  has presented today for surgery, with the diagnosis of LEFT BREAST CANCER.  The various methods of treatment have been discussed with the patient and family. After consideration of risks, benefits and other options for treatment, the patient has consented to  Procedure(s): RE-EXCISION OF LEFT BREAST LUMPECTOMY (Left) as a surgical intervention.  The patient's history has been reviewed, patient examined, no change in status, stable for surgery.  I have reviewed the patient's chart and labs.  Questions were answered to the patient's satisfaction.     [name]

## 2021-06-08 NOTE — Transfer of Care (Signed)
Immediate Anesthesia Transfer of Care Note  Patient: Yvonne [name]  Procedure(s) Performed: RE-EXCISION OF LEFT BREAST LUMPECTOMY (Left: Breast)  Patient Location: PACU  Anesthesia Type:General  Level of Consciousness: awake, alert , oriented and patient cooperative  Airway & Oxygen Therapy: Patient Spontanous Breathing and Patient connected to face mask oxygen  Post-op Assessment: Report given to RN, Post -op Vital signs reviewed and stable and Patient moving all extremities X 4  Post vital signs: stable  Last Vitals:  Vitals Value Taken Time  BP 128/65 06/08/21 1200  Temp 36.8 C 06/08/21 1157  Pulse 75 06/08/21 1207  Resp 9 06/08/21 1207  SpO2 99 % 06/08/21 1207  Vitals shown include unvalidated device data.  Last Pain:  Vitals:   06/08/21 1157  TempSrc:   PainSc: 0-No pain         Complications: No notable events documented.

## 2021-06-08 NOTE — Discharge Instructions (Signed)
[facility] Office Phone Number [phone]  BREAST BIOPSY/ PARTIAL MASTECTOMY: POST OP INSTRUCTIONS  Always review your discharge instruction sheet given to you by the facility where your surgery was performed.  IF YOU HAVE DISABILITY OR FAMILY LEAVE FORMS, YOU MUST BRING THEM TO THE OFFICE FOR PROCESSING.  DO NOT GIVE THEM TO YOUR DOCTOR.  A prescription for pain medication may be given to you upon discharge.  Take your pain medication as prescribed, if needed.  If narcotic pain medicine is not needed, then you may take acetaminophen (Tylenol) or ibuprofen (Advil) as needed. Take your usually prescribed medications unless otherwise directed If you need a refill on your pain medication, please contact your pharmacy.  They will contact our office to request authorization.  Prescriptions will not be filled after 5pm or on week-ends. You should eat very light the first 24 hours after surgery, such as soup, crackers, pudding, etc.  Resume your normal diet the day after surgery. Most patients will experience some swelling and bruising in the breast.  Ice packs and a good support bra will help.  Swelling and bruising can take several days to resolve.  It is common to experience some constipation if taking pain medication after surgery.  Increasing fluid intake and taking a stool softener will usually help or prevent this problem from occurring.  A mild laxative (Milk of Magnesia or Miralax) should be taken according to package directions if there are no bowel movements after 48 hours. Unless discharge instructions indicate otherwise, you may remove your bandages 24-48 hours after surgery, and you may shower at that time.  You may have steri-strips (small skin tapes) in place directly over the incision.  These strips should be left on the skin for 7-10 days.  If your surgeon used skin glue on the incision, you may shower in 24 hours.  The glue will flake off over the next 2-3 weeks.  Any  sutures or staples will be removed at the office during your follow-up visit. ACTIVITIES:  You may resume regular daily activities (gradually increasing) beginning the next day.  Wearing a good support bra or sports bra minimizes pain and swelling.  You may have sexual intercourse when it is comfortable. You may drive when you no longer are taking prescription pain medication, you can comfortably wear a seatbelt, and you can safely maneuver your car and apply brakes. RETURN TO WORK:  ______________________________________________________________________________________ You should see your doctor in the office for a follow-up appointment approximately two weeks after your surgery.  Your doctor's nurse will typically make your follow-up appointment when she calls you with your pathology report.  Expect your pathology report 2-3 business days after your surgery.  You may call to check if you do not hear from us after three days. OTHER INSTRUCTIONS: _______________________________________________________________________________________________ _____________________________________________________________________________________________________________________________________ _____________________________________________________________________________________________________________________________________ _____________________________________________________________________________________________________________________________________  WHEN TO CALL YOUR DOCTOR: Fever over 101.0 Nausea and/or vomiting. Extreme swelling or bruising. Continued bleeding from incision. Increased pain, redness, or drainage from the incision.  The clinic staff is available to answer your questions during regular business hours.  Please don't hesitate to call and ask to speak to one of the nurses for clinical concerns.  If you have a medical emergency, go to the nearest emergency room or call 911.  A surgeon from [facility] is always on call at the hospital.  For further questions, please visit [email or web address]   

## 2021-06-08 NOTE — Anesthesia Preprocedure Evaluation (Addendum)
Anesthesia Evaluation  Patient identified by MRN, date of birth, ID band Patient awake    Reviewed: Allergy & Precautions, NPO status , Patient's Chart, lab work & pertinent test results  History of Anesthesia Complications Negative for: history of anesthetic complications  Airway Mallampati: III  TM Distance: >3 FB Neck ROM: Full    Dental  (+) Edentulous Upper, Edentulous Lower   Pulmonary neg pulmonary ROS,    Pulmonary exam normal        Cardiovascular hypertension, Pt. on medications Normal cardiovascular exam+ Valvular Problems/Murmurs AS    Echo 03/28/21: EF >75%, no RWMA, mod asymmetric LVH of basal-septal segment, g1dd, nl RVSF, mild MS, mild AR, mod AS (MG 28, AVA 1.15)   Neuro/Psych Anxiety Depression B/l carotid artery disease CVA    GI/Hepatic negative GI ROS, Neg liver ROS,   Endo/Other  diabetes, Type 2Hypothyroidism   Renal/GU negative Renal ROS  negative genitourinary   Musculoskeletal  (+) Arthritis ,   Abdominal   Peds  Hematology negative hematology ROS (+)   Anesthesia Other Findings  Breast cancer  Reproductive/Obstetrics                           Anesthesia Physical Anesthesia Plan  ASA: 3  Anesthesia Plan: General   Post-op Pain Management: Tylenol PO (pre-op)   Induction: Intravenous  PONV Risk Score and Plan: 3 and Ondansetron, Dexamethasone and Treatment may vary due to age or medical condition  Airway Management Planned: LMA  Additional Equipment: None  Intra-op Plan:   Post-operative Plan: Extubation in OR  Informed Consent: I have reviewed the patients History and Physical, chart, labs and discussed the procedure including the risks, benefits and alternatives for the proposed anesthesia with the patient or authorized representative who has indicated his/her understanding and acceptance.     Dental advisory given  Plan Discussed with:    Anesthesia Plan Comments:        Anesthesia Quick Evaluation

## 2021-06-08 NOTE — Anesthesia Postprocedure Evaluation (Signed)
Anesthesia Post Note  Patient: Yvonne [name]  Procedure(s) Performed: RE-EXCISION OF LEFT BREAST LUMPECTOMY (Left: Breast)     Patient location during evaluation: PACU Anesthesia Type: General Level of consciousness: awake and alert Pain management: pain level controlled Vital Signs Assessment: post-procedure vital signs reviewed and stable Respiratory status: spontaneous breathing, nonlabored ventilation and respiratory function stable Cardiovascular status: blood pressure returned to baseline and stable Postop Assessment: no apparent nausea or vomiting Anesthetic complications: no   No notable events documented.  Last Vitals:  Vitals:   06/08/21 1315 06/08/21 1330  BP: (!) 103/57 104/79  Pulse: 79 73  Resp: 17 12  Temp:  36.9 C  SpO2: 92% 96%    Last Pain:  Vitals:   06/08/21 1330  TempSrc:   PainSc: 3                  Lidia Collum

## 2021-06-09 ENCOUNTER — Encounter (HOSPITAL_COMMUNITY): Payer: Self-pay | Admitting: Surgery

## 2021-06-09 ENCOUNTER — Ambulatory Visit: Payer: Medicare HMO

## 2021-06-09 ENCOUNTER — Ambulatory Visit: Payer: Medicare HMO | Admitting: Radiation Oncology

## 2021-06-09 LAB — SURGICAL PATHOLOGY

## 2021-06-15 ENCOUNTER — Encounter: Payer: Self-pay | Admitting: *Deleted

## 2021-06-20 ENCOUNTER — Ambulatory Visit: Payer: Medicare HMO | Admitting: Radiation Oncology

## 2021-06-20 ENCOUNTER — Other Ambulatory Visit: Payer: Self-pay

## 2021-06-20 ENCOUNTER — Encounter: Payer: Self-pay | Admitting: Radiation Oncology

## 2021-06-20 ENCOUNTER — Ambulatory Visit: Payer: Medicare HMO | Admitting: Rehabilitation

## 2021-06-20 ENCOUNTER — Ambulatory Visit
Admission: RE | Admit: 2021-06-20 | Discharge: 2021-06-20 | Disposition: A | Discharge status: Home / Self Care | Payer: Medicare HMO | Source: Ambulatory Visit | Attending: Radiation Oncology | Admitting: Radiation Oncology

## 2021-06-20 ENCOUNTER — Ambulatory Visit: Payer: Medicare HMO

## 2021-06-20 VITALS — BP 134/62 | HR 92 | Temp 97.8°F | Resp 20 | Ht 61.0 in | Wt 163.0 lb

## 2021-06-20 DIAGNOSIS — Z170 Estrogen receptor positive status [ER+]: Secondary | ICD-10-CM

## 2021-06-20 DIAGNOSIS — C50512 Malignant neoplasm of lower-outer quadrant of left female breast: Secondary | ICD-10-CM

## 2021-06-20 DIAGNOSIS — Z510 Encounter for antineoplastic radiation therapy: Secondary | ICD-10-CM | POA: Diagnosis not present

## 2021-06-20 NOTE — Progress Notes (Signed)
Radiation Oncology         [phone] ________________________________  Name: Yvonne [name] MRN: [id number]  Date: 06/20/2021  DOB: 13-Nov-1940  Follow-Up Visit Note  Outpatient  CC: Martyn Malay, MD  Truitt Merle, MD  Diagnosis:      ICD-10-CM   1. 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     Since breast clinic consultation date of 04/26/21, the patient opted to proceed with left breast lumpectomy and nodal biopsies on the date of 05/17/21. Pathology from the procedure revealed: tumor size of 5.5 cm (max extent); histology of grade 2 invasive ductal carcinoma and ductal carcinoma in-situ; with invasive disease involving the lateral margin, and a small fragment of tissue suspicious for malignancy involving the inferior margin (anterior, medial, superior, and posterior margins free of carcinoma). Left additional lateral margin excision also revealed usual ductal hyperplasia, fibrocystic changes, intraductal papilloma, and an old fibroadenoma. Left additional superior margin excision also revealed lobular neoplasia, complex sclerosing lesion, fibrocystic changes, and usual ductal hyperplasia. Margin status of in-situ disease negative (all margins negative for DCIS).  Nodal status of 3/3 left axillary sentinel node excisions negative for carcinoma;  ER status: 100% positive; PR status 100% positive, (both with strong staining intensity); Her2 status negative; proliferation marker Ki67 at 5%; Grade 2.  Accordingly, the patient underwent re-excision of her left lumpectomy on 06/08/21 (given positive margin status). Pathology from the procedure  revealed: no evidence of carcinoma from left lateral margin lumpectomy, with benign breast parenchyma and previous procedure-related changes.   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 A right femoral bone density study was also performed which revealed a T-score of -0.30. However, imaging findings note that BMD could not be completely determined based off of this study.   Dr Burr Medico not recommend Oncotype test. Giving the strong ER and PR expression and her postmenopausal status, she recommends adjuvant endocrine therapy with aromatase inhibitor for a total of 5-10 years to reduce the risk of cancer recurrence.   Lymphedema issues, if any:  Patient denies, but does report limited range of motion to both her shoulders from arthritis (scheduled for PT re-evaluation on 06/27/21)   Pain issues, if any:  Reports occasional sharp pains near surgical site, but states it's tolerable and resolves on its own  SAFETY ISSUES: Prior radiation? No Pacemaker/ICD? No Possible current pregnancy? No--hysterectomy Is the patient on methotrexate? No  Current Complaints / other details:  Nothing else of note            ALLERGIES:  has No Known Allergies.  Meds: Current Outpatient Medications  Medication Sig Dispense Refill   acetaminophen (TYLENOL) 325 MG tablet Take 650 mg by mouth every 6 (six) hours as needed for mild pain or headache.     amitriptyline (ELAVIL) 50 MG tablet Take 1 tablet (50 mg total) by mouth at bedtime. 90 tablet 3   amLODipine (NORVASC) 5 MG tablet Take 1 tablet (5 mg total) by mouth at bedtime. (Patient  taking differently: Take 5 mg by mouth daily. Pt takes in the am) 90 tablet 3   Ascorbic Acid (VITAMIN C GUMMIE PO) Take 1 tablet by mouth daily.     aspirin 81 MG chewable tablet Chew 1 tablet (81 mg total) by mouth daily.  (Patient taking differently: Chew 81 mg by mouth daily. At bedtime) 90 tablet 3   atorvastatin (LIPITOR) 20 MG tablet Take 1 tablet (20 mg total) by mouth daily. 90 tablet 3   benazepril (LOTENSIN) 40 MG tablet Take 1 tablet (40 mg total) by mouth daily. (Patient taking differently: Take 40 mg by mouth daily. Pt takes in the am) 90 tablet 3   diclofenac Sodium (VOLTAREN) 1 % GEL Apply 4 g topically 4 (four) times daily as needed (pain).     gabapentin (NEURONTIN) 100 MG capsule 1 tablet in the morning and 2 at night (Patient taking differently: Take 100-200 mg by mouth See admin instructions. 113m in the morning and 2037mat bedtime) 270 capsule 3   glucose blood (RELION TRUE METRIX TEST STRIPS) test strip Use as instructed 100 each 12   hydrochlorothiazide (HYDRODIURIL) 12.5 MG tablet Take 1 tablet (12.5 mg total) by mouth daily. 90 tablet 3   HYDROcodone-acetaminophen (NORCO/VICODIN) 5-325 MG tablet Take 1 tablet by mouth every 6 (six) hours as needed for moderate pain. 15 tablet 0   HYDROcodone-acetaminophen (NORCO/VICODIN) 5-325 MG tablet Take 1 tablet by mouth every 6 (six) hours as needed for moderate pain. 15 tablet 0   ibuprofen (ADVIL) 800 MG tablet Take 1 tablet (800 mg total) by mouth every 8 (eight) hours as needed. 30 tablet 0   levothyroxine (SYNTHROID) 88 MCG tablet Take 1 tablet (88 mcg total) by mouth every morning. 30 minutes before food (Patient taking differently: Take 88 mcg by mouth daily. 30 minutes before food) 90 tablet 3   Menthol, Topical Analgesic, (BIOFREEZE ROLL-ON EX) Apply 1 application topically 3 (three) times daily as needed (back pain).     Misc Natural Products (OSTEO BI-FLEX ADV TRIPLE ST) TABS Take 1 tablet by mouth daily. 6065mf Vitamin C  (Ascorbic Acid); 2mg39mnganese (Manganese Sulfate); 35mg29mium; 1500mg 80mosamine HCI; 100mg 55min (Boswellia Serrata Extract resin); 1103mg (C42mroitin/MSM Complex (Chrondroitin Sulfate, Methylsulfonylmethane, Collagen  (Hydrolyzed Gelatin), Boswellia Serrata (resin); Boron (Bororganic Glycine); Hyaluronic Acid     Multiple Vitamins-Minerals (CENTRUM SILVER ADULT 50+) TABS Take 1 tablet by mouth daily.     oxymetazoline (AFRIN) 0.05 % nasal spray Place 1 spray into both nostrils 2 (two) times daily as needed for congestion.     VITAMIN E/D-ALPHA PO Take 1 tablet by mouth daily.     No current facility-administered medications for this encounter.    Physical Findings:  height is 5' 1" (1.549 m) and weight is 163 lb (73.9 kg). Her temperature is 97.8 F (36.6 C). Her blood pressure is 134/62 and her pulse is 92. Her respiration is 20 and oxygen saturation is 97%. .     General: Alert and oriented, in no acute distress Breast exam reveals seroma, left breast, with post operative erythema of skin. No oozing.  Lab Findings: Lab Results  Component Value Date   WBC 11.0 (H) 05/31/2021   HGB 13.5 05/31/2021   HCT 41.0 05/31/2021   MCV 90.7 05/31/2021   PLT 359 05/31/2021      Radiographic Findings: No results found.  Impression/Plan: We discussed adjuvant radiotherapy today.  I recommend radiation therapy to the left breast in order to reduce  risk of locoregional recurrence by 2/3.  I reviewed the logistics, benefits, risks, and potential side effects of this treatment in detail. Risks may include but not necessary be limited to acute and late injury tissue in the radiation fields such as skin irritation (change in color/pigmentation, itching, dryness, pain, peeling). She may experience fatigue. We also discussed possible risk of long term cosmetic changes or scar tissue. There is also a smaller risk for lung toxicity, cardiac toxicity, brachial plexopathy, lymphedema, musculoskeletal changes, rib fragility or induction of a second malignancy, late chronic non-healing soft tissue wound.    The patient asked good questions which I answered to her satisfaction. She is enthusiastic about proceeding with  treatment. A consent form has been  signed and placed in her chart. CT simulation today, treatment start on 1/16.  Nursing will contact her surgeon's office to inquire about post op evaluation scheduling - pt unaware of any appointments.  Patient's caregiver present today, and she is very supportive.   On date of service, in total, I spent 30 minutes on this encounter. Patient was seen in person.  _____________________________________   Eppie Gibson, MD  This document serves as a record of services personally performed by Eppie Gibson, MD. It was created on her behalf by Roney Mans, a trained medical scribe. The creation of this record is based on the scribe's personal observations and the provider's statements to them. This document has been checked and approved by the attending provider.

## 2021-06-20 NOTE — Progress Notes (Signed)
Location of Breast Cancer:  Malignant neoplasm of lower-outer quadrant of left breast of female, estrogen receptor positive  Histology per Pathology Report:  05/17/2021 FINAL MICROSCOPIC DIAGNOSIS:  A. 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  - Anterior margin free.  Tumor approaches to less than 0.1 cm of inked  margin.  - Medial margin widely free of tumor.  - Posterior margin free.  Tumor approaches to 0.2 cm of inked margin.  - Ductal carcinoma in situ.  B. BREAST, LEFT ADDITIONAL LATERAL MARGIN, EXCISION:  - Invasive ductal carcinoma involving new lateral margin.  - Usual ductal hyperplasia and fibrocystic changes.  - Intraductal papilloma.  - Old fibroadenoma.  C. BREAST, LEFT ADDITIONAL SUPERIOR MARGIN, EXCISION:  - No carcinoma identified.  New superior margin free.  - Lobular neoplasia (atypical lobular hyperplasia).  - Complex sclerosing lesion.  - Fibrocystic changes and usual ductal hyperplasia.  D. BREAST, LEFT ADDITIONAL MEDIAL MARGIN, EXCISION:  - New medial margin free.  Invasive ductal carcinoma approaches to 0.8  cm of inked margin.  E. BREAST, LEFT ADDITIONAL INFERIOR MARGIN, EXCISION:  - Small fragment of tissue suspicious for malignancy, 0.2 cm from inked  margin.  - New inferior margin free.  F. BREAST, LEFT ADDITIONAL POSTERIOR MARGIN, EXCISION:  - New posterior margin free.  Invasive ductal carcinoma approaches to  0.2 cm of inked margin.  G. BREAST, LEFT ADDITIONAL ANTERIOR MARGIN, EXCISION:  - No malignancy identified.  H. LYMPH NODE, LEFT AXILLARY, SENTINEL, EXCISION:  - One lymph node, negative for carcinoma (0/1).  I. LYMPH NODE, LEFT AXILLARY, SENTINEL, EXCISION:  - One lymph node, negative for carcinoma (0/1).  J. LYMPH NODE, LEFT AXILLARY, SENTINEL, EXCISION:  - One lymph node, negative for carcinoma (0/1).  COMMENT:  The final (new) lateral  resection margin is positive for carcinoma.  All  other final (new) margins are negative.   Receptor Status: ER(100%), PR (100%), Her2-neu (Negative via FISH), Ki-67(5%)  Did patient present with symptoms (if so, please note symptoms) or was this found on screening mammography?: Patient presented with palpable left breast lump. 04/05/2021: Bilateral diagnostic mammogram/ultrasound showed irregular left breast mass at 4-6 o'clock, size indeterminate by [place] but it appears to be large, about 7cm  Past/Anticipated interventions by surgeon, if any:  06/08/2021 --Dr. Marcello Moores [place] Left breast reexcision lumpectomy  05/17/2021 --Dr. Marcello Moores [place]  Left breast seed localized lumpectomy using 2 seeds to bracket the lesion  Left axillary sentinel lymph node mapping using mag trace  Past/Anticipated interventions by medical oncology, if any:  Under care of Dr. Truitt Merle 04/26/2021 We discussed her imaging findings and the biopsy results in great details. Given her large size of breast, she is likely a candidate for lumpectomy. She is agreeable with that. She was seen by Dr. Brantley Stage today and likely will proceed with surgery soon.  Given her advanced age and overall health, she is not a candidate for chemotherapy.   I do not recommend Oncotype test. Giving the strong ER and PR expression in her postmenopausal status, I recommend adjuvant endocrine therapy with aromatase inhibitor for a total of 5-10 years to reduce the risk of cancer recurrence.  Potential benefits and side effects were discussed with patient and she is interested. She was also seen by radiation oncologist Dr. Isidore Moos today. She will likely benefit from breast radiation if she undergo lumpectomy to decrease the risk of breast cancer.  We  also discussed the breast cancer surveillance after her surgery. She will continue annual screening mammogram, self exam, and a routine office visit with lab and exam with [place]. I encouraged her to have  healthy diet and exercise regularly.  I will see her back after breast radiation  Lymphedema issues, if any:  Patient denies, but does report limited range of motion to both her shoulders from arthritis (scheduled for PT re-evaluation on 06/27/21)   Pain issues, if any:  Reports occasional sharp pains near surgical site, but states it's tolerable and resolves on its own  SAFETY ISSUES: Prior radiation? No Pacemaker/ICD? No Possible current pregnancy? No--hysterectomy Is the patient on methotrexate? No  Current Complaints / other details:  Nothing else of note

## 2021-06-21 ENCOUNTER — Encounter: Payer: Self-pay | Admitting: Radiation Oncology

## 2021-06-22 ENCOUNTER — Telehealth: Payer: Self-pay | Admitting: General Practice

## 2021-06-22 NOTE — Telephone Encounter (Signed)
[facility] CSW Progress Notes  Call to patient per referral for transportation assistance.  No answer, left VM on mobile and home numbers.  Appears that referral was made to Transportation Services in Nov 2022, attempting to verify if patient has been enrolled or not.  Message sent to [employer].  Edwyna Shell, LCSW Clinical Social Worker Phone:  [phone]

## 2021-06-23 ENCOUNTER — Telehealth: Payer: Self-pay | Admitting: General Practice

## 2021-06-23 ENCOUNTER — Encounter: Payer: Self-pay | Admitting: General Practice

## 2021-06-23 DIAGNOSIS — C50512 Malignant neoplasm of lower-outer quadrant of left female breast: Secondary | ICD-10-CM | POA: Diagnosis not present

## 2021-06-23 DIAGNOSIS — Z510 Encounter for antineoplastic radiation therapy: Secondary | ICD-10-CM | POA: Diagnosis not present

## 2021-06-23 DIAGNOSIS — Z170 Estrogen receptor positive status [ER+]: Secondary | ICD-10-CM | POA: Diagnosis not present

## 2021-06-23 NOTE — Progress Notes (Signed)
[facility] Work  Initial Assessment   Yvonne [name] is a 81 y.o. year old female contacted by phone. Clinical Social Work was referred by radiation oncology for assessment of psychosocial needs.   SDOH (Social Determinants of Health) assessments performed: Yes SDOH Interventions    Flowsheet Row Most Recent Value  SDOH Interventions   Housing Interventions Intervention Not Indicated  Stress Interventions Intervention Not Indicated  Social Connections Interventions Intervention Not Indicated       Distress Screen completed: No No flowsheet data found.    Family/Social Information:  Housing Arrangement: patient lives alone Family members/support persons in your life? Family and son/daughter live in [place] area.  She also has a aide from [employer] who comes Mon - Fri from 11:30 - 1.  Aide will help transport her to/from radiation appointments Transportation concerns: yes, enrolled in [facility] [employer] in case aide is not able to transport, she knows how to contact [facility] to request ride.   Employment: Retired. Income source: Paediatric nurse concerns: No Type of concern:  money is tight but she affords the basics Food access concerns: no Religious or spiritual practice: yes, attends nearby church regularly and uses her faith resources Medication Concerns: no  Services Currently in place:  DSS aide  Coping/ Adjustment to diagnosis: Patient understands treatment plan and what happens next? yes, diagnosed with Stage 2 breast cancer, underwent lumpectomy and now will have radiation treatment.  "Do I still have cancer?  I was told it's all gone."  Advised her to talk to medical providers.  Also worried about whether radiation will "hurt."  Is anxious about upcoming treatments..  Concerns about diagnosis and/or treatment: Pain or discomfort during procedures Patient reported stressors: Physical issues Hopes and priorities: "get through treatment, get back to  normal life." Current coping skills/ strengths: Religious Affiliation  and Supportive family/friends     SUMMARY: Current SDOH Barriers:  Transportation  Interventions: Discussed common feeling and emotions when being diagnosed with cancer, and the importance of support during treatment Informed patient of the support team roles and support services at [facility] Provided CSW contact information and encouraged patient to call with any questions or concerns She is already enrolled in [facility] [employer] to address any transport needs.    Follow Up Plan: Patient will contact CSW with any support or resource needs Patient verbalizes understanding of plan: Yes    Beverely Pace , [place], LCSW Clinical Social Worker Phone:  [phone]

## 2021-06-23 NOTE — Telephone Encounter (Signed)
[place] CSW Progress Notes  Referral received, transportation concerns.  Confirmed w [facility] that patient is enrolled in [employer] and can request rides.  Staff is aware of her needs and reaching out to patient.  CSW has been unable to reach patient by phone - closing referral as transportation concern appears to have been addressed.  Please reconsult social work if needed.  Edwyna Shell, LCSW Clinical Social Worker Phone:  [phone]

## 2021-06-26 NOTE — Progress Notes (Signed)
° ° °  SUBJECTIVE:   CHIEF COMPLAINT: check up  HPI:   Yvonne [name] is a 81 y.o.  with history notable for recent diagnosis of breast cancer, type 2 diabetes (A1C 6.8) and severe (asymptomatic) aortic stenosis presenting for routine follow up and influenza vaccination.   She is joined by her caregiver, [name].   Her concerns today include:  Fall in the fall No falls since that time. No headaches, chest pain, syncope, dyspnea. Doing well overall. Had recent DEXA that showed   Type 2 Diabetes A1C 6.8 at recent Oncology visit. Down from 6.9 six months prior. Denies polyuria, polydipsia. She is on no medications.   Medications Patient has all of her mediations with her today. No concerns about refills. Her benazepril pills are two different colors--both confirmed to be this medication.   Recent diagnosis of breast cancer  Patient has a recent diagnosis of stage 2A, ER positive, PR positive, HER2 negative breast cancer of the left breast.  She is status postlumpectomy and lymph node removal.  Margins were also resected.  She is planning for radiation therapy.  Dr. Harvie Bridge for aromatase inhibitor therapy it seems.  She reports she is doing well.  She has occasional sharp pains.  No issues with lymphedema.  No pains at night.  She reports she is doing well.  She reports her mood is okay.  She feels supported.  She has enough support to get all of her medications and get to all of her appointments.  She reports her sleep is doing okay.  PERTINENT  PMH / PSH/Family/Social History : severe aortic stenosis, recent diagnosis of breast cancer   OBJECTIVE:   BP (!) 146/53    Pulse 78    Ht $R'5\' 1"'YE$  (1.549 m)    Wt 164 lb 2 oz (74.4 kg)    SpO2 96%    BMI 31.01 kg/m   Today's weight:  Last Weight  Most recent update: 06/27/2021  2:06 PM    Weight  74.4 kg (164 lb 2 oz)            Review of prior weights: Filed Weights   06/27/21 1406  Weight: 164 lb 2 oz (74.4 kg)     Cardiac:  Regular rate and rhythm. Normal S1/S2. Systolic murmur grade 3/6 at RUSB with radiation to carotids, late peaking. Lungs: Clear bilaterally to ascultation.  Abdomen: Normoactive bowel sounds. No tenderness to deep or light palpation. No rebound or guarding.  No masses Psych: Pleasant and appropriate  Foot Exam surgically absent right fifth toe.  She has slightly reduced sensation over the plantar aspect of the right metatarsal phalangeal joint.  There are no calluses or ulcers. ASSESSMENT/PLAN:   Aortic stenosis Has follow-up with cardiology.  She remains asymptomatic.  Do not suspect her fall in 2020 was related to this.  Reviewed return precautions.  Hypothyroidism TSH today for monitoring.  Last value was within normal limits just under 1 year ago.  Controlled type 2 diabetes mellitus with chronic kidney disease, without long-term current use of insulin (HCC) Diet controlled.  A1c similar to 6 months ago.  Sensate on monofilament exam today.  Will obtain lipid panel.    HCM Recommended Tdap, Flu, and Shringrix  Bivalent COVID and flu shot given.  Osteopenia- DEXA in fall 2023  Bilateral carotid stenosis- remains asymptomatic        Dorris Singh, MD  [place]

## 2021-06-27 ENCOUNTER — Other Ambulatory Visit: Payer: Self-pay

## 2021-06-27 ENCOUNTER — Encounter: Payer: Self-pay | Admitting: Rehabilitation

## 2021-06-27 ENCOUNTER — Ambulatory Visit: Payer: Medicare HMO | Attending: Surgery | Admitting: Rehabilitation

## 2021-06-27 ENCOUNTER — Ambulatory Visit (INDEPENDENT_AMBULATORY_CARE_PROVIDER_SITE_OTHER): Payer: Medicare HMO | Admitting: Family Medicine

## 2021-06-27 ENCOUNTER — Encounter: Payer: Self-pay | Admitting: Family Medicine

## 2021-06-27 ENCOUNTER — Telehealth: Payer: Self-pay | Admitting: Hematology

## 2021-06-27 ENCOUNTER — Ambulatory Visit (INDEPENDENT_AMBULATORY_CARE_PROVIDER_SITE_OTHER): Payer: Medicare HMO

## 2021-06-27 ENCOUNTER — Encounter: Payer: Self-pay | Admitting: *Deleted

## 2021-06-27 VITALS — BP 146/53 | HR 78 | Ht 61.0 in | Wt 164.1 lb

## 2021-06-27 DIAGNOSIS — Z23 Encounter for immunization: Secondary | ICD-10-CM

## 2021-06-27 DIAGNOSIS — R262 Difficulty in walking, not elsewhere classified: Secondary | ICD-10-CM | POA: Insufficient documentation

## 2021-06-27 DIAGNOSIS — R293 Abnormal posture: Secondary | ICD-10-CM | POA: Insufficient documentation

## 2021-06-27 DIAGNOSIS — Z170 Estrogen receptor positive status [ER+]: Secondary | ICD-10-CM | POA: Insufficient documentation

## 2021-06-27 DIAGNOSIS — I350 Nonrheumatic aortic (valve) stenosis: Secondary | ICD-10-CM | POA: Diagnosis not present

## 2021-06-27 DIAGNOSIS — E1122 Type 2 diabetes mellitus with diabetic chronic kidney disease: Secondary | ICD-10-CM

## 2021-06-27 DIAGNOSIS — E038 Other specified hypothyroidism: Secondary | ICD-10-CM

## 2021-06-27 DIAGNOSIS — C50512 Malignant neoplasm of lower-outer quadrant of left female breast: Secondary | ICD-10-CM | POA: Insufficient documentation

## 2021-06-27 DIAGNOSIS — Z483 Aftercare following surgery for neoplasm: Secondary | ICD-10-CM | POA: Insufficient documentation

## 2021-06-27 MED ORDER — SHINGRIX 50 MCG/0.5ML IM SUSR: 0.5000 mL | INTRAMUSCULAR | 0 refills | Status: AC

## 2021-06-27 MED ORDER — TETANUS-DIPHTH-ACELL PERTUSSIS 5-2.5-18.5 LF-MCG/0.5 IM SUSY: 0.5000 mL | PREFILLED_SYRINGE | Freq: Once | INTRAMUSCULAR | 0 refills | Status: AC

## 2021-06-27 NOTE — Assessment & Plan Note (Signed)
Diet controlled.  A1c similar to 6 months ago.  Sensate on monofilament exam today.  Will obtain lipid panel.

## 2021-06-27 NOTE — Patient Instructions (Signed)
° ° °   [facility]  [address]  [place] [address]  [phone]  After Breast Cancer Class It is recommended you attend the ABC class to be educated on lymphedema risk reduction. This class is free of charge and lasts for 1 hour. It is a 1-time class. You will need to download the Webex app either on your phone or computer. We will send you a link the night before or the morning of the class. You should be able to click on that link to join the class. This is not a confidential class. You don't have to turn your camera on, but other participants may be able to see your email address.  Scar massage You can begin gentle scar massage to you incision sites. Gently place one hand on the incision and move the skin (without sliding on the skin) in various directions. Do this for a few minutes and then you can gently massage either coconut oil or vitamin E cream into the scars.  Compression garment You should continue wearing your compression bra until you feel like you no longer have swelling.  Home exercise Program Continue doing the exercises you were given until you feel like you can do them without feeling any tightness at the end.   Walking Program Studies show that 30 minutes of walking per day (fast enough to elevate your heart rate) can significantly reduce the risk of a cancer recurrence. If you can't walk due to other medical reasons, we encourage you to find another activity you could do (like a stationary bike or water exercise).  Posture After breast cancer surgery, people frequently sit with rounded shoulders posture because it puts their incisions on slack and feels better. If you sit like this and scar tissue forms in that position, you can become very tight and have pain sitting or standing with good posture. Try to be aware of your posture and sit and stand up tall to heal properly.  Follow up PT: It is recommended you return every 3 months for  the first 3 years following surgery to be assessed on the SOZO machine for an L-Dex score. This helps prevent clinically significant lymphedema in 95% of patients. These follow up screens are 10 minute appointments that you are not billed for. - 09/11/21 at 1150

## 2021-06-27 NOTE — Telephone Encounter (Signed)
Scheduled per 01/10 scheduled message, patient has been called voicemail was left.

## 2021-06-27 NOTE — Assessment & Plan Note (Signed)
Has follow-up with cardiology.  She remains asymptomatic.  Do not suspect her fall in 2020 was related to this.  Reviewed return precautions.

## 2021-06-27 NOTE — Therapy (Signed)
[facility] @ [place], [place], [address] Phone: [phone]   Fax:  [phone]  Physical Therapy Treatment  Patient Details  Name: Yvonne [name] MRN: [id number] Date of Birth: Apr 18, 1941 Referring Provider (PT): Dr. Erroll Luna   Encounter Date: 06/27/2021   PT End of Session - 06/27/21 1330     Visit Number 2    Number of Visits 2    Date for PT Re-Evaluation 06/21/21    PT Start Time 1302    PT Stop Time 1325   pt needs to leave by 130   PT Time Calculation (min) 23 min    Activity Tolerance Patient tolerated treatment well    Behavior During Therapy [facility] for tasks assessed/performed             Past Medical History:  Diagnosis Date   Anemia    Anxiety    Aortic stenosis    severe by 2021 echo    Arthritis    Bilateral carotid artery disease ([place]) 02/04/2017   Carotid US 3/22: R 1-39; L 100   Depression, recurrent ([place]) 02/10/2019   Hypertension    Hypothyroidism    Impaired functional mobility, balance, gait, and endurance 09/02/2015   Malignant neoplasm of lower-outer quadrant of left breast of female, estrogen receptor positive ([place]) 04/24/2021   Mild cognitive impairment with memory loss 09/02/2015   Osteopenia    Pre-diabetes    Stroke ([place])    pt was 40    Past Surgical History:  Procedure Laterality Date   ABDOMINAL HYSTERECTOMY     BACK SURGERY     2019   BRAIN SURGERY     age 36's   BREAST LUMPECTOMY WITH RADIOACTIVE SEED AND SENTINEL LYMPH NODE BIOPSY Left 05/17/2021   Procedure: LEFT BREAST SEED LOCALIZED LUMPECTOMY (BRACKETED) WITH SENTINEL LYMPH NODE BIOPSY;  Surgeon: Erroll Luna, MD;  Location: [facility];  Service: General;  Laterality: Left;  PEC BLOCK 90 MINUTES ROOM 9   RE-EXCISION OF BREAST LUMPECTOMY Left 06/08/2021   Procedure: RE-EXCISION OF LEFT BREAST LUMPECTOMY;  Surgeon: Erroll Luna, MD;  Location: [facility];  Service: General;  Laterality: Left;   TOE  AMPUTATION  2013   2nd toe on right foot, hammer toe   TONSILLECTOMY     TOTAL HIP ARTHROPLASTY Left 03/19/2017   Procedure: LEFT TOTAL HIP ARTHROPLASTY ANTERIOR APPROACH;  Surgeon: Mcarthur Rossetti, MD;  Location: [facility];  Service: Orthopedics;  Laterality: Left;    There were no vitals filed for this visit.   Subjective Assessment - 06/27/21 1303     Subjective I am doing okay    Pertinent History Rt lumpectomy 05/17/21 and reexcision 06/08/21 with SLNB of unknown number all negative.  Radiation starting next week. 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 She is functionally limited and has a caregiver 1.5 hours/day 5 days/week.    Currently in Pain? No/denies   intermittent pain in the breast               [facility] PT Assessment - 06/27/21 0001       Observation/Other Assessments   Observations wearing normal non wire bra - declines need for compression bra information    Skin Integrity incisions not examined today but caregiver reports well healed      AROM   Left Shoulder Flexion 140 Degrees    Left Shoulder ABduction 145 Degrees  LYMPHEDEMA/ONCOLOGY QUESTIONNAIRE - 06/27/21 0001       Left Upper Extremity Lymphedema   10 cm Proximal to Olecranon Process 26 cm    Olecranon Process 22.5 cm    Just Proximal to Ulnar Styloid Process 15.4 cm    Across Hand at [employer] 18 cm    At [place] of 2nd Digit 6 cm                Quick Dash - 06/27/21 0001     Open a tight or new jar Moderate difficulty    Do heavy household chores (wash walls, wash floors) Mild difficulty    Carry a shopping bag or briefcase No difficulty    Wash your back Moderate difficulty    Use a knife to cut food Mild difficulty    Recreational activities in which you take some force or impact through your arm, shoulder, or hand (golf, hammering, tennis) No difficulty    During the past week, to what extent has your arm, shoulder or hand  problem interfered with your normal social activities with family, friends, neighbors, or groups? Not at all    During the past week, to what extent has your arm, shoulder or hand problem limited your work or other regular daily activities Not at all    Arm, shoulder, or hand pain. Moderate    Tingling (pins and needles) in your arm, shoulder, or hand None    Difficulty Sleeping Severe difficulty    DASH Score 25 %                             PT Education - 06/27/21 1330     Education Details reviewed risk reduction and posture    Person(s) Educated Patient;Caregiver(s)    Methods Explanation    Comprehension Verbalized understanding                 PT Long Term Goals - 06/27/21 1332       PT LONG TERM GOAL #1   Title Patient will demonstrate she has regained full shoulder ROM and function post operatively compared to baselines.    Status Achieved                   Plan - 06/27/21 1331     Clinical Impression Statement Pt returns post Lt lumpectomy and SLNB with return to PLOF.  AROM is more than baseline and pt reports no limitations.  Pt was already able to undergo radiation simulation wtihout pain or pulling.  Does report some mild breast edema from surgery but declines compression bra needs, etc.  Pt is scheduled for [facility]ZO followup.    PT Next Visit Plan 3 month SOZO    Consulted and Agree with Plan of Care Patient;Family member/caregiver    Family Member Consulted Caregiver             Patient will benefit from skilled therapeutic intervention in order to improve the following deficits and impairments:     Visit Diagnosis: Malignant neoplasm of lower-outer quadrant of left breast of female, estrogen receptor positive ([place])  Abnormal posture  Difficulty in walking, not elsewhere classified  Aftercare following surgery for neoplasm     Problem List Patient Active Problem List   Diagnosis Date Noted   Stage II breast cancer  ([place]) 04/28/2021   Malignant neoplasm of lower-outer quadrant of left breast of female, estrogen receptor positive ([place]) 04/24/2021  Gait instability 02/17/2021   Counseling regarding advanced care planning and goals of care 09/02/2020   Controlled type 2 diabetes mellitus with chronic kidney disease, without long-term current use of insulin ([place]) 11/27/2019   Memory impairment 08/31/2019   Depression, recurrent ([place]) 02/10/2019   Degenerative spondylolisthesis 06/06/2018   Sensorineural hearing loss (SNHL), 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   Bilateral carotid artery disease ([place]) 02/04/2017   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   Osteopenia 08/30/2014   Osteoarthritis 04/12/2011   Aortic stenosis 12/12/2009   Hypothyroidism 07/12/2009   INSOMNIA, PERSISTENT 07/12/2009   Essential hypertension 07/12/2009    Stark Bray, PT 06/27/2021, 1:33 PM  [facility] @ [place], [place], [address] Phone: [phone]   Fax:  [phone]  Name: Yvonne [name] MRN: [id number] Date of Birth: 1940-07-08

## 2021-06-27 NOTE — Assessment & Plan Note (Signed)
TSH today for monitoring.  Last value was within normal limits just under 1 year ago.

## 2021-06-27 NOTE — Patient Instructions (Addendum)
It was wonderful to see you today.  Please bring ALL of your medications with you to every visit.   Today we talked about:  -Checking your thyroid and cholesterol   -The white pill is also benzapril    --I will see you in 6 months   --I will call you with results    Thank you for choosing [facility].   Please call [phone] with any questions about today's appointment.  Please be sure to schedule follow up at the front  desk before you leave today.   Dorris Singh, MD  Family Medicine

## 2021-06-28 ENCOUNTER — Ambulatory Visit: Payer: Medicare HMO | Admitting: Cardiovascular Disease

## 2021-06-28 ENCOUNTER — Encounter: Payer: Self-pay | Admitting: Family Medicine

## 2021-06-28 ENCOUNTER — Telehealth: Payer: Self-pay | Admitting: Family Medicine

## 2021-06-28 LAB — LIPID PANEL
Chol/HDL Ratio: 3 ratio (ref 0.0–4.4)
Cholesterol, Total: 149 mg/dL (ref 100–199)
HDL: 49 mg/dL (ref >39)
LDL Chol Calc (NIH): 62 mg/dL (ref 0–99)
Triglycerides: 237 mg/dL — ABNORMAL HIGH (ref 0–149)
VLDL Cholesterol Cal: 38 mg/dL (ref 5–40)

## 2021-06-28 LAB — TSH: TSH: 2.52 u[IU]/mL (ref 0.450–4.500)

## 2021-06-28 NOTE — Telephone Encounter (Signed)
Attempted to call patient. Reached voicemail, left generic voicemail to call back about results. Will send letter.   Dorris Singh, MD  Family Medicine Teaching Service

## 2021-06-28 NOTE — Telephone Encounter (Signed)
Error

## 2021-06-29 ENCOUNTER — Encounter: Payer: Self-pay | Admitting: Cardiovascular Disease

## 2021-06-29 ENCOUNTER — Ambulatory Visit: Payer: Medicare HMO | Admitting: Radiation Oncology

## 2021-06-29 NOTE — Progress Notes (Signed)
Yvonne [name] Date of Birth  [birth year] [place] [address]    [place]   [place], [place]  [address]    [place], [place]  [address] [phone]  Fax  [phone]  [phone]  Fax [phone]   Problem List: 1. Mild AS / AI  2. Hypothyroidism 3. Hypertension 4. Toe amputation for hammer toe 5. History of stroke- occluded left carotid, mild right carotid disease 6. CVA - age 81      Pt is a 81 y.o. female with a hx of stroke at age 60.  She continues to have problems with her right arm.  She has had a heart murmur for years.  She has not had any recent problems.  She has a hx of HTN.  She admits to eating extra salt on occasion.  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 This corresponds with her previous stroke. There was no significant disease on right carotid. An echocardiogram revealed normal left ventricular systolic function.  She has mild aortic stenosis and aortic insufficiency which explains her heart murmur.  She eats lots of salty meals.  She eats prepared lunch and dinners every day.  She eats popcorn every night.    December 08, 2014:    Yvonne [name] is seen back after a 3 year absence.  Still eats some salt .  She is doing well.  She fell the other day.   misstepped on the cement.    December 12, 2015:  Doing well. Is now walking with a walker  Doing well from a cardiac standpoint .  Aug. 20, 2018  Yvonne [name] is seen today  Is walking with a walker for the past year BP looks good today    Aug. 26, 2019:  Yvonne [name] is seen today  Doing well Hx of a stroke at age 43.  Has HTN,  Still eats some some salt.   Jan. 13, 2023 Yvonne [name] is seen today for follow up of her aortic stenosis,  HTN,    Has mod. Severe AS  Has seen McAlhany -  is waiting for her to develop symptoms before proceeding with TAVR  Was supposed to be seen on Jan. 11 - she cancelled.  ( Was  supposed to see McAlhany today from what I can tell )    Has some palpitations at night   Just diagnosed with left  breast cancer  Is doing XRT   Current Outpatient Medications on File Prior to Visit  Medication Sig Dispense Refill   amitriptyline (ELAVIL) 50 MG tablet Take 1 tablet (50 mg total) by mouth at bedtime. 90 tablet 3   amLODipine (NORVASC) 5 MG tablet Take 1 tablet (5 mg total) by mouth at bedtime. 90 tablet 3   Ascorbic Acid (VITAMIN C GUMMIE PO) Take 1 tablet by mouth daily.     aspirin 81 MG chewable tablet Chew 1 tablet (81 mg total) by mouth daily. 90 tablet 3   atorvastatin (LIPITOR) 20 MG tablet Take 1 tablet (20 mg total) by mouth daily. 90 tablet 3   benazepril (LOTENSIN) 40 MG tablet Take 1 tablet (40 mg total) by mouth daily. 90 tablet 3   gabapentin (NEURONTIN) 100 MG capsule 1 tablet in the morning and 2 at night 270 capsule 3   hydrochlorothiazide (HYDRODIURIL) 12.5 MG tablet Take 1 tablet (12.5 mg total) by mouth daily. 90 tablet  3   levothyroxine (SYNTHROID) 88 MCG tablet Take 1 tablet (88 mcg total) by mouth every morning. 30 minutes before food 90 tablet 3   Menthol, Topical Analgesic, (BIOFREEZE ROLL-ON EX) Apply 1 application topically 3 (three) times daily as needed (back pain).     Misc Natural Products (OSTEO BI-FLEX ADV TRIPLE ST) TABS Take 1 tablet by mouth daily. 60mg  of Vitamin C  (Ascorbic Acid); 2mg  Manganese (Manganese Sulfate); 35mg  Sodium; 1500mg  Glucosamine HCI; 100mg  5-Loxin (Boswellia Serrata Extract resin); 1103mg  (Chondroitin/MSM Complex (Chrondroitin Sulfate, Methylsulfonylmethane, Collagen (Hydrolyzed Gelatin), Boswellia Serrata (resin); Boron (Bororganic Glycine); Hyaluronic Acid     Multiple Vitamins-Minerals (CENTRUM SILVER ADULT 50+) TABS Take 1 tablet by mouth daily.     oxymetazoline (AFRIN) 0.05 % nasal spray Place 1 spray into both nostrils 2 (two) times daily as needed for congestion.     VITAMIN E/D-ALPHA PO Take 1 tablet by mouth  daily.     acetaminophen (TYLENOL) 325 MG tablet Take 650 mg by mouth every 6 (six) hours as needed for mild pain or headache. (Patient not taking: Reported on 06/30/2021)     diclofenac Sodium (VOLTAREN) 1 % GEL Apply 4 g topically 4 (four) times daily as needed (pain). (Patient not taking: Reported on 06/30/2021)     glucose blood (RELION TRUE METRIX TEST STRIPS) test strip Use as instructed (Patient not taking: Reported on 06/30/2021) 100 each 12   HYDROcodone-acetaminophen (NORCO/VICODIN) 5-325 MG tablet Take 1 tablet by mouth every 6 (six) hours as needed for moderate pain. (Patient not taking: Reported on 06/30/2021) 15 tablet 0   HYDROcodone-acetaminophen (NORCO/VICODIN) 5-325 MG tablet Take 1 tablet by mouth every 6 (six) hours as needed for moderate pain. (Patient not taking: Reported on 06/30/2021) 15 tablet 0   ibuprofen (ADVIL) 800 MG tablet Take 1 tablet (800 mg total) by mouth every 8 (eight) hours as needed. (Patient not taking: Reported on 06/30/2021) 30 tablet 0   Zoster Vaccine Adjuvanted [facility]) injection Inject 0.5 mLs into the muscle every 2 (two) months for 2 doses. 1 mL 0   No current facility-administered medications on file prior to visit.    No Known Allergies  Past Medical History:  Diagnosis Date   Anemia    Anxiety    Aortic stenosis    severe by 2021 echo    Arthritis    Bilateral carotid artery disease ([place]) 02/04/2017   Carotid US 3/22: R 1-39; L 100   Depression, recurrent ([place]) 02/10/2019   Hypertension    Hypothyroidism    Impaired functional mobility, balance, gait, and endurance 09/02/2015   Malignant neoplasm of lower-outer quadrant of left breast of female, estrogen receptor positive ([place]) 04/24/2021   Mild cognitive impairment with memory loss 09/02/2015   Osteopenia    Pre-diabetes    Stroke ([place])    pt was 40    Past Surgical History:  Procedure Laterality Date   ABDOMINAL HYSTERECTOMY     BACK SURGERY     2019   BRAIN SURGERY     age  51's   BREAST LUMPECTOMY WITH RADIOACTIVE SEED AND SENTINEL LYMPH NODE BIOPSY Left 05/17/2021   Procedure: LEFT BREAST SEED LOCALIZED LUMPECTOMY (BRACKETED) WITH SENTINEL LYMPH NODE BIOPSY;  Surgeon: Erroll Luna, MD;  Location: [facility];  Service: General;  Laterality: Left;  PEC BLOCK 90 MINUTES ROOM 9   RE-EXCISION OF BREAST LUMPECTOMY Left 06/08/2021   Procedure: RE-EXCISION OF LEFT BREAST LUMPECTOMY;  Surgeon: Erroll Luna, MD;  [facility];  Service:  General;  Laterality: Left;   TOE AMPUTATION  2013   2nd toe on right foot, hammer toe   TONSILLECTOMY     TOTAL HIP ARTHROPLASTY Left 03/19/2017   Procedure: LEFT TOTAL HIP ARTHROPLASTY ANTERIOR APPROACH;  Surgeon: Mcarthur Rossetti, MD;  Location: [facility];  Service: Orthopedics;  Laterality: Left;    Social History   Tobacco Use  Smoking Status Never  Smokeless Tobacco Never    Social History   Substance and Sexual Activity  Alcohol Use Not Currently   Alcohol/week: 1.0 standard drink   Types: 1 Glasses of wine per week    Family History  Problem Relation Age of Onset   Thyroid disease Mother    Heart disease Father     Reviw of Systems:  Noted is current hx   Physical Exam: Blood pressure 138/74, pulse 68, height 5\' 1"  (1.549 m), weight 164 lb 3.2 oz (74.5 kg), SpO2 93 %.  GEN:  elderly female,  NAD seen with her assistant, Jonni Sanger  HEENT: Normal NECK: No JVD; No carotid bruits LYMPHATICS: No lymphadenopathy CARDIAC: RRR , no murmurs, rubs, gallops RESPIRATORY:  Clear to auscultation without rales, wheezing or rhonchi  ABDOMEN: Soft, non-tender, non-distended MUSCULOSKELETAL:  No edema; No deformity  SKIN: Warm and dry NEUROLOGIC:  Alert and oriented x 3    ECG:   .    Assessment / Plan:   1. Aortic stenosis :   has mod - severe AS.   Also has some AI. Has been seeing Dr. Angelena Form for TAVR work up for the past year or so .    Her symptoms have not really worsened as of now.   Will have her see  Dr. Angelena Form / Nell Range PA  / Kathyrn Drown NP in 4 months   2. Hypothyroidism - per her primary MD   3. Hypertension -  BP is well controlled.   4. Toe amputation for hammer toe   5. History of stroke- at age 58     Mertie Moores, MD  06/30/2021 5:17 [address] [place],  [place],   [address] Pager [phone] Phone: [phone]; Fax: [phone]

## 2021-06-30 ENCOUNTER — Ambulatory Visit: Payer: Medicare HMO

## 2021-06-30 ENCOUNTER — Encounter: Payer: Self-pay | Admitting: Cardiovascular Disease

## 2021-06-30 ENCOUNTER — Other Ambulatory Visit: Payer: Self-pay

## 2021-06-30 ENCOUNTER — Ambulatory Visit (INDEPENDENT_AMBULATORY_CARE_PROVIDER_SITE_OTHER): Payer: Medicare HMO | Admitting: Cardiovascular Disease

## 2021-06-30 VITALS — BP 138/74 | HR 68 | Ht 61.0 in | Wt 164.2 lb

## 2021-06-30 DIAGNOSIS — I350 Nonrheumatic aortic (valve) stenosis: Secondary | ICD-10-CM | POA: Diagnosis not present

## 2021-06-30 NOTE — Patient Instructions (Addendum)
Medication Instructions:  Your physician recommends that you continue on your current medications as directed. Please refer to the Current Medication list given to you today.  *If you need a refill on your cardiac medications before your next appointment, please call your pharmacy*  Lab Work: If you have labs (blood work) drawn today and your tests are completely normal, you will receive your results only by: [place] (if you have MyChart) OR A paper copy in the mail If you have any lab test that is abnormal or we need to change your treatment, we will call you to review the results.  Testing/Procedures: Your physician has requested that you have an echocardiogram in 4 months. Echocardiography is a painless test that uses sound waves to create images of your heart. It provides your doctor with information about the size and shape of your heart and how well your hearts chambers and valves are working. This procedure takes approximately one hour. There are no restrictions for this procedure.   Follow-Up: At [facility], you and your health needs are our priority.  As part of our continuing mission to provide you with exceptional heart care, we have created designated Provider Care Teams.  These Care Teams include your primary Cardiologist (physician) and Advanced Practice Providers (APPs -  Physician Assistants and Nurse Practitioners) who all work together to provide you with the care you need, when you need it.  We recommend signing up for the patient portal called "MyChart".  Sign up information is provided on this After Visit Summary.  MyChart is used to connect with patients for Virtual Visits (Telemedicine).  Patients are able to view lab/test results, encounter notes, upcoming appointments, etc.  Non-urgent messages can be sent to your provider as well.   To learn more about what you can do with MyChart, go to [email or web address].    Your next appointment:   4 to 5  month(s)  The format for your next appointment:   In Person  Provider:   Dr. Angelena Form or any structural heart NP/PA

## 2021-07-03 ENCOUNTER — Other Ambulatory Visit: Payer: Self-pay

## 2021-07-03 ENCOUNTER — Ambulatory Visit
Admission: RE | Admit: 2021-07-03 | Discharge: 2021-07-03 | Disposition: A | Discharge status: Home / Self Care | Payer: Medicare HMO | Source: Ambulatory Visit | Attending: Radiation Oncology | Admitting: Radiation Oncology

## 2021-07-03 DIAGNOSIS — Z170 Estrogen receptor positive status [ER+]: Secondary | ICD-10-CM | POA: Diagnosis not present

## 2021-07-03 DIAGNOSIS — C50512 Malignant neoplasm of lower-outer quadrant of left female breast: Secondary | ICD-10-CM | POA: Diagnosis not present

## 2021-07-03 DIAGNOSIS — Z510 Encounter for antineoplastic radiation therapy: Secondary | ICD-10-CM | POA: Diagnosis not present

## 2021-07-04 ENCOUNTER — Ambulatory Visit
Admission: RE | Admit: 2021-07-04 | Discharge: 2021-07-04 | Disposition: A | Discharge status: Home / Self Care | Payer: Medicare HMO | Source: Ambulatory Visit | Attending: Radiation Oncology | Admitting: Radiation Oncology

## 2021-07-04 DIAGNOSIS — C50512 Malignant neoplasm of lower-outer quadrant of left female breast: Secondary | ICD-10-CM | POA: Diagnosis not present

## 2021-07-04 DIAGNOSIS — Z170 Estrogen receptor positive status [ER+]: Secondary | ICD-10-CM

## 2021-07-04 DIAGNOSIS — Z510 Encounter for antineoplastic radiation therapy: Secondary | ICD-10-CM | POA: Diagnosis not present

## 2021-07-04 MED ORDER — ALRA NON-METALLIC DEODORANT (RAD-ONC)
1.0000 "application " | Freq: Once | TOPICAL | Status: AC
  Administered 2021-07-04: 1 via TOPICAL

## 2021-07-04 MED ORDER — RADIAPLEXRX EX GEL: Freq: Once | CUTANEOUS | Status: AC

## 2021-07-04 NOTE — Progress Notes (Signed)
Pt here for patient teaching. Pt given Managing Acute Radiation Side Effects for Head and Neck Cancer handout, skin care instructions, [place]ra deodorant, and Radiaplex gel.  Reviewed areas of pertinence such as fatigue, hair loss, skin changes, headache, breast tenderness, breast swelling, shortness of breath, earaches, and taste changes. Pt able to give teach back of to pat skin, use unscented/gentle soap, and drink plenty of water, apply Radiaplex bid, avoid applying anything to skin within 4 hours of treatment, avoid wearing an under wire bra, and to use an electric razor if they must shave. Pt verbalizes understanding of information given and will contact nursing with any questions or concerns.     [email or web address]

## 2021-07-05 ENCOUNTER — Ambulatory Visit
Admission: RE | Admit: 2021-07-05 | Discharge: 2021-07-05 | Disposition: A | Discharge status: Home / Self Care | Payer: Medicare HMO | Source: Ambulatory Visit | Attending: Radiation Oncology | Admitting: Radiation Oncology

## 2021-07-05 ENCOUNTER — Other Ambulatory Visit: Payer: Self-pay

## 2021-07-05 DIAGNOSIS — C50512 Malignant neoplasm of lower-outer quadrant of left female breast: Secondary | ICD-10-CM | POA: Diagnosis not present

## 2021-07-05 DIAGNOSIS — Z510 Encounter for antineoplastic radiation therapy: Secondary | ICD-10-CM | POA: Diagnosis not present

## 2021-07-05 DIAGNOSIS — Z170 Estrogen receptor positive status [ER+]: Secondary | ICD-10-CM | POA: Diagnosis not present

## 2021-07-06 ENCOUNTER — Ambulatory Visit
Admission: RE | Admit: 2021-07-06 | Discharge: 2021-07-06 | Disposition: A | Discharge status: Home / Self Care | Payer: Medicare HMO | Source: Ambulatory Visit | Attending: Radiation Oncology | Admitting: Radiation Oncology

## 2021-07-06 DIAGNOSIS — C50512 Malignant neoplasm of lower-outer quadrant of left female breast: Secondary | ICD-10-CM | POA: Diagnosis not present

## 2021-07-06 DIAGNOSIS — Z510 Encounter for antineoplastic radiation therapy: Secondary | ICD-10-CM | POA: Diagnosis not present

## 2021-07-06 DIAGNOSIS — Z170 Estrogen receptor positive status [ER+]: Secondary | ICD-10-CM | POA: Diagnosis not present

## 2021-07-07 ENCOUNTER — Other Ambulatory Visit: Payer: Self-pay

## 2021-07-07 ENCOUNTER — Ambulatory Visit
Admission: RE | Admit: 2021-07-07 | Discharge: 2021-07-07 | Disposition: A | Discharge status: Home / Self Care | Payer: Medicare HMO | Source: Ambulatory Visit | Attending: Radiation Oncology | Admitting: Radiation Oncology

## 2021-07-07 DIAGNOSIS — Z170 Estrogen receptor positive status [ER+]: Secondary | ICD-10-CM | POA: Diagnosis not present

## 2021-07-07 DIAGNOSIS — C50512 Malignant neoplasm of lower-outer quadrant of left female breast: Secondary | ICD-10-CM | POA: Diagnosis not present

## 2021-07-07 DIAGNOSIS — Z510 Encounter for antineoplastic radiation therapy: Secondary | ICD-10-CM | POA: Diagnosis not present

## 2021-07-10 ENCOUNTER — Ambulatory Visit
Admission: RE | Admit: 2021-07-10 | Discharge: 2021-07-10 | Disposition: A | Discharge status: Home / Self Care | Payer: Medicare HMO | Source: Ambulatory Visit | Attending: Radiation Oncology | Admitting: Radiation Oncology

## 2021-07-10 ENCOUNTER — Other Ambulatory Visit: Payer: Self-pay

## 2021-07-10 DIAGNOSIS — Z510 Encounter for antineoplastic radiation therapy: Secondary | ICD-10-CM | POA: Diagnosis not present

## 2021-07-10 DIAGNOSIS — Z170 Estrogen receptor positive status [ER+]: Secondary | ICD-10-CM | POA: Diagnosis not present

## 2021-07-10 DIAGNOSIS — C50512 Malignant neoplasm of lower-outer quadrant of left female breast: Secondary | ICD-10-CM | POA: Diagnosis not present

## 2021-07-11 ENCOUNTER — Ambulatory Visit
Admission: RE | Admit: 2021-07-11 | Discharge: 2021-07-11 | Disposition: A | Discharge status: Home / Self Care | Payer: Medicare HMO | Source: Ambulatory Visit | Attending: Radiation Oncology | Admitting: Radiation Oncology

## 2021-07-11 DIAGNOSIS — Z170 Estrogen receptor positive status [ER+]: Secondary | ICD-10-CM | POA: Diagnosis not present

## 2021-07-11 DIAGNOSIS — C50512 Malignant neoplasm of lower-outer quadrant of left female breast: Secondary | ICD-10-CM | POA: Diagnosis not present

## 2021-07-11 DIAGNOSIS — Z510 Encounter for antineoplastic radiation therapy: Secondary | ICD-10-CM | POA: Diagnosis not present

## 2021-07-12 ENCOUNTER — Other Ambulatory Visit: Payer: Self-pay

## 2021-07-12 ENCOUNTER — Ambulatory Visit: Admission: RE | Admit: 2021-07-12 | Payer: Medicare HMO | Source: Ambulatory Visit

## 2021-07-12 DIAGNOSIS — C50512 Malignant neoplasm of lower-outer quadrant of left female breast: Secondary | ICD-10-CM | POA: Diagnosis not present

## 2021-07-12 DIAGNOSIS — Z510 Encounter for antineoplastic radiation therapy: Secondary | ICD-10-CM | POA: Diagnosis not present

## 2021-07-12 DIAGNOSIS — Z170 Estrogen receptor positive status [ER+]: Secondary | ICD-10-CM | POA: Diagnosis not present

## 2021-07-13 ENCOUNTER — Ambulatory Visit: Payer: Medicare HMO

## 2021-07-13 ENCOUNTER — Ambulatory Visit
Admission: RE | Admit: 2021-07-13 | Discharge: 2021-07-13 | Disposition: A | Discharge status: Home / Self Care | Payer: Medicare HMO | Source: Ambulatory Visit | Attending: Radiation Oncology | Admitting: Radiation Oncology

## 2021-07-13 DIAGNOSIS — Z510 Encounter for antineoplastic radiation therapy: Secondary | ICD-10-CM | POA: Diagnosis not present

## 2021-07-13 DIAGNOSIS — C50512 Malignant neoplasm of lower-outer quadrant of left female breast: Secondary | ICD-10-CM | POA: Diagnosis not present

## 2021-07-13 DIAGNOSIS — Z170 Estrogen receptor positive status [ER+]: Secondary | ICD-10-CM | POA: Diagnosis not present

## 2021-07-14 ENCOUNTER — Ambulatory Visit
Admission: RE | Admit: 2021-07-14 | Discharge: 2021-07-14 | Disposition: A | Discharge status: Home / Self Care | Payer: Medicare HMO | Source: Ambulatory Visit | Attending: Radiation Oncology | Admitting: Radiation Oncology

## 2021-07-14 ENCOUNTER — Other Ambulatory Visit: Payer: Self-pay

## 2021-07-14 DIAGNOSIS — Z170 Estrogen receptor positive status [ER+]: Secondary | ICD-10-CM | POA: Diagnosis not present

## 2021-07-14 DIAGNOSIS — Z510 Encounter for antineoplastic radiation therapy: Secondary | ICD-10-CM | POA: Diagnosis not present

## 2021-07-14 DIAGNOSIS — C50512 Malignant neoplasm of lower-outer quadrant of left female breast: Secondary | ICD-10-CM | POA: Diagnosis not present

## 2021-07-17 ENCOUNTER — Ambulatory Visit
Admission: RE | Admit: 2021-07-17 | Discharge: 2021-07-17 | Disposition: A | Discharge status: Home / Self Care | Payer: Medicare HMO | Source: Ambulatory Visit | Attending: Radiation Oncology | Admitting: Radiation Oncology

## 2021-07-17 ENCOUNTER — Ambulatory Visit: Payer: Medicare HMO | Admitting: Radiation Oncology

## 2021-07-17 ENCOUNTER — Other Ambulatory Visit: Payer: Self-pay

## 2021-07-17 ENCOUNTER — Encounter: Payer: Self-pay | Admitting: Cardiovascular Disease

## 2021-07-17 ENCOUNTER — Ambulatory Visit (INDEPENDENT_AMBULATORY_CARE_PROVIDER_SITE_OTHER): Payer: Medicare HMO | Admitting: Cardiovascular Disease

## 2021-07-17 VITALS — BP 126/74 | HR 71 | Ht 61.0 in | Wt 163.2 lb

## 2021-07-17 DIAGNOSIS — C50512 Malignant neoplasm of lower-outer quadrant of left female breast: Secondary | ICD-10-CM | POA: Diagnosis not present

## 2021-07-17 DIAGNOSIS — I062 Rheumatic aortic stenosis with insufficiency: Secondary | ICD-10-CM

## 2021-07-17 DIAGNOSIS — Z170 Estrogen receptor positive status [ER+]: Secondary | ICD-10-CM | POA: Diagnosis not present

## 2021-07-17 DIAGNOSIS — Z510 Encounter for antineoplastic radiation therapy: Secondary | ICD-10-CM | POA: Diagnosis not present

## 2021-07-17 NOTE — Progress Notes (Signed)
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 She was seen as a new consult in October 2021 for further discussion regarding her aortic stenosis. Her aortic stenosis has been moderate and has been followed by Dr. Acie Fredrickson for several years. She had a stroke at age 63 and is known to have a chronic left internal carotid artery occlusion. Last carotid dopplers in September 2020 with mild RICA stenosis and occluded LICA. She has had right sided arm and leg weakness since her stroke and has neuropathy in both feet. She had a craniotomy in her 5s with removal of a benign tumor. Most recent echo 03/01/20 with LVEF=65-70%, moderate LVH. The aortic valve leaflets are thickened and calcified with mean gradient 33 mmHg, AVA 0.8 cm2 and dimensionless index of 0.23. SVI 38. The valve leaflets are restricted. The valve visually appears to have severe stenosis. 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 When I met her in October 2021, she denied chest pain, dyspnea, dizziness, lower extremity edema. No palpitations.She was seen by video visit in January 2022 due to a snow day. She denied any complaints that day, specifically denying any dyspnea at rest or with exertion. I saw her in February 2022 and she was doing well. Echo 09/14/20 with LVEF=65-70%, moderate LVH. Moderate aortic stenosis with mean gradient 25 mmHg, peak gradient 39.39mHg, AVA 1.18 cm2, dimensionless index 0.41, SVI 54. I saw her in June 2022 and she was feeling well. Most recent echo October 2022 with LVEF over  75%, moderate aortic stenosis with mean gradient 28 mmHg, peak gradient 529mg, AVA 1.1 cm2, DI 0.37, SVI 41. She was been diagnosed with breast cancer and has had left lumpectomy and is undergoing radiation therapy.   She is here today for follow up. The patient denies any chest pain, dyspnea, palpitations, lower extremity edema, orthopnea, PND, dizziness, near syncope or syncope. She is feeling well. She is halfway done with radiation therapy.   [place] works with her 2 hours per day in her home. 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 She has no active dental issues and sees a dentist regularly.    Primary Care Physician: BrMartyn MalayMD Primary Cardiologist: Nahser Referring Cardiologist: Nahser  Past Medical History:  Diagnosis Date   Anemia    Anxiety    Aortic stenosis    severe by 2021 echo    Arthritis    Bilateral carotid artery disease ([facility]/20/2018   Carotid [place]/22: R 1-39; L 100   Depression, recurrent ([facility] [place]/25/2020   Hypertension    Hypothyroidism    Impaired functional mobility, balance, gait, and endurance 09/02/2015   Malignant neoplasm of lower-outer quadrant of left breast of female, estrogen receptor positive ([facility]/12/2020   Mild cognitive impairment with memory loss 09/02/2015   Osteopenia    Pre-diabetes    Stroke ([facility]   pt was 40    Past Surgical History:  Procedure Laterality Date   ABDOMINAL HYSTERECTOMY     BACK SURGERY  2019   BRAIN SURGERY     age 76's   BREAST LUMPECTOMY WITH RADIOACTIVE SEED AND SENTINEL LYMPH NODE BIOPSY Left 05/17/2021   Procedure: LEFT BREAST SEED LOCALIZED LUMPECTOMY (BRACKETED) WITH SENTINEL LYMPH NODE BIOPSY;  Surgeon: Erroll Luna, MD;  Location: [facility];  Service: General;  Laterality: Left;  PEC BLOCK 90 MINUTES ROOM 9   RE-EXCISION OF BREAST LUMPECTOMY Left 06/08/2021   Procedure: RE-EXCISION OF LEFT BREAST LUMPECTOMY;  Surgeon: Erroll Luna, MD;  Location: [facility];  Service: General;  Laterality: Left;   TOE AMPUTATION  2013   2nd toe on right foot, hammer toe   TONSILLECTOMY     TOTAL HIP ARTHROPLASTY Left 03/19/2017   Procedure: LEFT TOTAL HIP ARTHROPLASTY ANTERIOR APPROACH;  Surgeon: Mcarthur Rossetti, MD;  Location: [facility];  Service: Orthopedics;  Laterality: Left;    Current Outpatient Medications  Medication Sig Dispense Refill   amitriptyline (ELAVIL) 50 MG tablet Take 1 tablet (50 mg total) by mouth at bedtime. 90 tablet 3   amLODipine (NORVASC) 5 MG tablet Take 1 tablet (5 mg total) by mouth at bedtime. 90 tablet 3   Ascorbic Acid (VITAMIN C GUMMIE PO) Take 1 tablet by mouth daily.     aspirin 81 MG chewable tablet Chew 1 tablet (81 mg total) by mouth daily. 90 tablet 3   atorvastatin (LIPITOR) 20 MG tablet Take 1 tablet (20 mg total) by mouth daily. 90 tablet 3   benazepril (LOTENSIN) 40 MG tablet Take 1 tablet (40 mg total) by mouth daily. 90 tablet 3   gabapentin (NEURONTIN) 100 MG capsule 1 tablet in the morning and 2 at night 270 capsule 3   ibuprofen (ADVIL) 800 MG tablet Take 1 tablet (800 mg total) by mouth every 8 (eight) hours as needed. 30 tablet 0   levothyroxine (SYNTHROID) 88 MCG tablet Take 1 tablet (88 mcg total) by mouth every morning. 30 minutes before food 90 tablet 3   Misc Natural Products (OSTEO BI-FLEX ADV TRIPLE ST) TABS Take 1 tablet by mouth daily. 32m of Vitamin C  (Ascorbic Acid); 266mManganese (Manganese Sulfate); 3551modium; 1500m73mucosamine HCI; 100mg62moxin (Boswellia Serrata Extract resin); 1103mg 38mndroitin/MSM Complex (Chrondroitin Sulfate, Methylsulfonylmethane, Collagen (Hydrolyzed Gelatin), Boswellia Serrata (resin); Boron (Bororganic Glycine); Hyaluronic Acid     Multiple Vitamins-Minerals (CENTRUM SILVER ADULT 50+) TABS Take 1 tablet by mouth daily.     oxymetazoline (AFRIN) 0.05 % nasal spray Place 1 spray into both nostrils 2 (two) times daily as needed for  congestion.     VITAMIN E/D-ALPHA PO Take 1 tablet by mouth daily.     Zoster Vaccine Adjuvanted (SHINGOcean State Endoscopy Centerction Inject 0.5 mLs into the muscle every 2 (two) months for 2 doses. 1 mL 0   No current facility-administered medications for this visit.    No Known Allergies  Social History   Socioeconomic History   Marital status: Widowed    Spouse name: Not on file   Number of children: 2   Years of education: masters   Highest education level: Not on file  Occupational History   Occupation: RetirePrimary school teacher[employer]  Tobacco Use   Smoking status: Never   Smokeless tobacco: Never  Vaping Use   Vaping Use: Never used  Substance and Sexual Activity   Alcohol use: Not Currently    Alcohol/week: 1.0 standard drink    Types: 1 Glasses of wine per week   Drug use: No   Sexual activity: Not Currently  Birth control/protection: Post-menopausal  Other Topics Concern   Not on file  Social History Narrative   Health Care POA:    Emergency Contact: son, Doreene Eland, (c) [phone]   End of Life Plan:    Who lives with you: lives alone now    Any pets: 2 cats, Sadie and Katie   Diet: Pt has a varied diet and tries to limit sweets.    Exercise: Pt walks on treadmill 10 minutes a day and uses bike 10 minutes a day.   Seatbelts: Pt reports wearing seatbelt when in vehicles.    Sun Exposure/Protection: Pt does not use sun protection.   Hobbies: reading, watching movies, writing      Husband passed away about 10 years ago    Social Determinants of Radio broadcast assistant Strain: Low Risk    Difficulty of Paying Living Expenses: Not very hard  Food Insecurity: Not on file  Transportation Needs: No Transportation Needs   Lack of Transportation (Medical): No   Lack of Transportation (Non-Medical): No  Physical Activity: Not on file  Stress: No Stress Concern Present   Feeling of Stress : Only a little  Social Connections: Moderately Integrated   Frequency of  Communication with Friends and Family: More than three times a week   Frequency of Social Gatherings with Friends and Family: Once a week   Attends Religious Services: More than 4 times per year   Active Member of [employer] or Organizations: Yes   Attends Archivist Meetings: Never   Marital Status: Widowed  Human resources officer Violence: Not on file    Family History  Problem Relation Age of Onset   Thyroid disease Mother    Heart disease Father     Review of Systems:  As stated in the HPI and otherwise negative.   BP 126/74    Pulse 71    Ht 5' 1" (1.549 m)    Wt 163 lb 3.2 oz (74 kg)    SpO2 94%    BMI 30.84 kg/m   Physical Examination: General: Well developed, well nourished, NAD  HEENT: OP clear, mucus membranes moist  SKIN: warm, dry. No rashes. Neuro: No focal deficits  Musculoskeletal: Muscle strength 5/5 all ext  Psychiatric: Mood and affect normal  Neck: No JVD, no carotid bruits, no thyromegaly, no lymphadenopathy.  Lungs:Clear bilaterally, no wheezes, rhonci, crackles Cardiovascular: Regular rate and rhythm. Systolic murmur.  Abdomen:Soft. Bowel sounds present. Non-tender.  Extremities: No lower extremity edema. Pulses are 2 + in the bilateral DP/PT.  EKG:  EKG is not ordered today. The ekg ordered today demonstrates   Echo October 2022:   1. The aortic valve is tricuspid. There is severe calcifcation of the  aortic valve. There is severe thickening of the aortic valve. Aortic valve  regurgitation is mild. Aortic valve area, by VTI measures 1.15 cm. Aortic  valve mean gradient measures 28.0   mmHg. Aortic valve Vmax measures 3.55 m/s.   2. Left ventricular ejection fraction, by estimation, is >75%. The left  ventricle has hyperdynamic function. The left ventricle has no regional  wall motion abnormalities. There is moderate asymmetric left ventricular  hypertrophy of the basal-septal  segment. Left ventricular diastolic parameters are consistent with  Grade I  diastolic dysfunction (impaired relaxation).   3. Right ventricular systolic function is normal. The right ventricular  size is normal. Tricuspid regurgitation signal is inadequate for assessing  PA pressure.   4. The mitral valve is degenerative. No  evidence of mitral valve  regurgitation. Mild mitral stenosis. The mean mitral valve gradient is 4.0  mmHg with average heart rate of 73 bpm.   5. 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   6. The inferior vena cava is normal in size with greater than 50%  respiratory variability, suggesting right atrial pressure of 3 mmHg.   Comparison(s): No significant change from prior study. EF unchanged. AS  remains moderate.   FINDINGS   Left Ventricle: Left ventricular ejection fraction, by estimation, is  >75%. The left ventricle has hyperdynamic function. The left ventricle has  no regional wall motion abnormalities. The left ventricular internal  cavity size was normal in size. There  is moderate asymmetric left ventricular hypertrophy of the basal-septal  segment. Left ventricular diastolic parameters are consistent with Grade I  diastolic dysfunction (impaired relaxation).   Right Ventricle: The right ventricular size is normal. No increase in  right ventricular wall thickness. Right ventricular systolic function is  normal. Tricuspid regurgitation signal is inadequate for assessing PA  pressure.   Left Atrium: Left atrial size was normal in size.   Right Atrium: Right atrial size was normal in size.   Pericardium: Trivial pericardial effusion is present. Presence of  pericardial fat pad.   Mitral Valve: The mitral valve is degenerative in appearance. Mild mitral  annular calcification. No evidence of mitral valve regurgitation. Mild  mitral valve stenosis. MV peak gradient, 13.0 mmHg. The mean mitral valve  gradient is 4.0 mmHg with average  heart rate of 73 bpm.   Tricuspid Valve: The tricuspid  valve is grossly normal. Tricuspid valve  regurgitation is mild . No evidence of tricuspid stenosis.   Aortic Valve: The aortic valve is tricuspid. There is severe calcifcation  of the aortic valve. There is severe thickening of the aortic valve.  Aortic valve regurgitation is mild. Aortic regurgitation PHT measures 446  msec. Aortic valve mean gradient  measures 28.0 mmHg. Aortic valve peak gradient measures 50.4 mmHg. Aortic  valve area, by VTI measures 1.15 cm.   Pulmonic Valve: The pulmonic valve was grossly normal. Pulmonic valve  regurgitation is not visualized. No evidence of pulmonic stenosis.   Aorta: The aortic root is normal in size and structure. There is severe  dilatation of the ascending aorta, measuring 44 mm.   Venous: The inferior vena cava is normal in size with greater than 50%  respiratory variability, suggesting right atrial pressure of 3 mmHg.   IAS/Shunts: The interatrial septum appears to be lipomatous. The atrial  septum is grossly normal.      LEFT VENTRICLE  PLAX 2D  LVIDd:         4.50 cm   Diastology  LVIDs:         2.10 cm   LV e' medial:    4.47 cm/s  LV PW:         1.10 cm   LV E/e' medial:  22.0  LV IVS:        1.40 cm   LV e' lateral:   6.16 cm/s  LVOT diam:     2.00 cm   LV E/e' lateral: 16.0  LV SV:         73  LV SV Index:   41        2D Longitudinal Strain  LVOT Area:     3.14 cm  2D Strain GLS (A2C):   -18.9 %  2D Strain GLS (A3C):   -14.5 %                           2D Strain GLS (A4C):   -14.6 %                           2D Strain GLS Avg:     -16.0 %   RIGHT VENTRICLE             IVC  RV Basal diam:  3.50 cm     IVC diam: 1.80 cm  RV S prime:     18.90 cm/s  TAPSE (M-mode): 2.5 cm   LEFT ATRIUM             Index        RIGHT ATRIUM           Index  LA diam:        4.50 cm 2.55 cm/m   RA Pressure: 3.00 mmHg  LA Vol (A2C):   36.7 ml 20.80 ml/m  RA Area:     14.20 cm  LA Vol (A4C):   42.1 ml 23.86 ml/m   RA Volume:   31.30 ml  17.74 ml/m  LA Biplane Vol: 39.6 ml 22.44 ml/m   AORTIC VALVE  AV Area (Vmax):    1.06 cm  AV Area (Vmean):   1.08 cm  AV Area (VTI):     1.15 cm  AV Vmax:           355.00 cm/s  AV Vmean:          229.500 cm/s  AV VTI:            0.632 m  AV Peak Grad:      50.4 mmHg  AV Mean Grad:      28.0 mmHg  LVOT Vmax:         120.00 cm/s  LVOT Vmean:        79.000 cm/s  LVOT VTI:          0.232 m  LVOT/AV VTI ratio: 0.37  AI PHT:            446 msec     AORTA  Ao Root diam: 3.50 cm  Ao Asc diam:  4.40 cm   MITRAL VALVE                TRICUSPID VALVE                              Estimated RAP:  3.00 mmHg  MV Area VTI:   2.14 cm  MV Peak grad:  13.0 mmHg    SHUNTS  MV Mean grad:  4.0 mmHg     Systemic VTI:  0.23 m  MV Vmax:       1.80 m/s     Systemic Diam: 2.00 cm  MV Vmean:      81.1 cm/s  MV Decel Time: 232 msec  MV E velocity: 98.50 cm/s  MV A velocity: 163.00 cm/s  MV E/A ratio:  0.60   Recent Labs: 04/26/2021: ALT 25 05/31/2021: BUN 13; Creatinine, Ser 0.63; Hemoglobin 13.5; Platelets 359; Potassium 3.7; Sodium 136 06/27/2021: TSH 2.520    Wt Readings from Last 3 Encounters:  07/17/21 163 lb 3.2 oz (74 kg)  06/30/21 164 lb 3.2 oz (74.5 kg)  06/27/21  164 lb 2 oz (74.4 kg)     STS Risk Score: Risk of Mortality: 2.113% Renal Failure: 1.226% Permanent Stroke: 3.797% Prolonged Ventilation: 7.839% DSW Infection: 0.068% Reoperation: 2.659% Morbidity or Mortality: 10.821% Short Length of Stay: 33.710% Long Length of Stay: 4.686   Assessment and Plan:   1. Severe Aortic Valve Stenosis: She has moderate to severe low flow/low gradient aortic stenosis. Aortic stenosis unchanged by echo October 2022. She remains asymptomatic.  Based on prior echo findings I suspect her AS is bordering on severe. I have reviewed the echo images. The valve leaflets are thickened and calcified with limited excursion. While she is not a candidate for  conventional surgical AVR due to advanced age, she will be a candidate for TAVR when she becomes symptomatic. She will complete her radiation therapy in the next 2 weeks.   I have reviewed the natural history of aortic stenosis with the patient and their family members  who are present today. We have discussed the limitations of medical therapy and the poor prognosis associated with symptomatic aortic stenosis. We have reviewed potential treatment options, including palliative medical therapy, conventional surgical aortic valve replacement, and transcatheter aortic valve replacement. We discussed treatment options in the context of the patient's specific comorbid medical conditions.     Will repeat echo in April 2023.    Current medicines are reviewed at length with the patient today.  The patient does not have concerns regarding medicines.  The following changes have been made:  no change  Labs/ tests ordered today include:   Orders Placed This Encounter  Procedures   ECHOCARDIOGRAM COMPLETE      Disposition:   FU with  in July 2023.    Signed, Lauree Chandler, MD 07/17/2021 2:29 PM    [address], [place], [place]  [address] Phone: [phone]; Fax: [phone]

## 2021-07-17 NOTE — Patient Instructions (Signed)
Medication Instructions:  Your physician recommends that you continue on your current medications as directed. Please refer to the Current Medication list given to you today.  *If you need a refill on your cardiac medications before your next appointment, please call your pharmacy*   Lab Work: None ordered   If you have labs (blood work) drawn today and your tests are completely normal, you will receive your results only by: [place] (if you have MyChart) OR A paper copy in the mail If you have any lab test that is abnormal or we need to change your treatment, we will call you to review the results.   Testing/Procedures: Your physician has requested that you have an echocardiogram in April. Echocardiography is a painless test that uses sound waves to create images of your heart. It provides your doctor with information about the size and shape of your heart and how well your hearts chambers and valves are working. This procedure takes approximately one hour. There are no restrictions for this procedure.    Follow-Up: At [facility], you and your health needs are our priority.  As part of our continuing mission to provide you with exceptional heart care, we have created designated Provider Care Teams.  These Care Teams include your primary Cardiologist (physician) and Advanced Practice Providers (APPs -  Physician Assistants and Nurse Practitioners) who all work together to provide you with the care you need, when you need it.  We recommend signing up for the patient portal called "MyChart".  Sign up information is provided on this After Visit Summary.  MyChart is used to connect with patients for Virtual Visits (Telemedicine).  Patients are able to view lab/test results, encounter notes, upcoming appointments, etc.  Non-urgent messages can be sent to your provider as well.   To learn more about what you can do with MyChart, go to [email or web address].    Your next appointment:    5 month(s)  The format for your next appointment:   In Person  Provider:   Dr. Lauree Chandler     Other Instructions None

## 2021-07-18 ENCOUNTER — Ambulatory Visit
Admission: RE | Admit: 2021-07-18 | Discharge: 2021-07-18 | Disposition: A | Discharge status: Home / Self Care | Payer: Medicare HMO | Source: Ambulatory Visit | Attending: Radiation Oncology | Admitting: Radiation Oncology

## 2021-07-18 DIAGNOSIS — Z170 Estrogen receptor positive status [ER+]: Secondary | ICD-10-CM | POA: Diagnosis not present

## 2021-07-18 DIAGNOSIS — Z510 Encounter for antineoplastic radiation therapy: Secondary | ICD-10-CM | POA: Diagnosis not present

## 2021-07-18 DIAGNOSIS — C50512 Malignant neoplasm of lower-outer quadrant of left female breast: Secondary | ICD-10-CM | POA: Diagnosis not present

## 2021-07-19 ENCOUNTER — Ambulatory Visit
Admission: RE | Admit: 2021-07-19 | Discharge: 2021-07-19 | Disposition: A | Discharge status: Home / Self Care | Payer: Medicare HMO | Source: Ambulatory Visit | Attending: Radiation Oncology | Admitting: Radiation Oncology

## 2021-07-19 ENCOUNTER — Other Ambulatory Visit: Payer: Self-pay

## 2021-07-19 DIAGNOSIS — Z510 Encounter for antineoplastic radiation therapy: Secondary | ICD-10-CM | POA: Diagnosis not present

## 2021-07-19 DIAGNOSIS — C50512 Malignant neoplasm of lower-outer quadrant of left female breast: Secondary | ICD-10-CM | POA: Insufficient documentation

## 2021-07-19 DIAGNOSIS — Z170 Estrogen receptor positive status [ER+]: Secondary | ICD-10-CM | POA: Diagnosis not present

## 2021-07-20 ENCOUNTER — Ambulatory Visit
Admission: RE | Admit: 2021-07-20 | Discharge: 2021-07-20 | Disposition: A | Discharge status: Home / Self Care | Payer: Medicare HMO | Source: Ambulatory Visit | Attending: Radiation Oncology | Admitting: Radiation Oncology

## 2021-07-20 DIAGNOSIS — C50512 Malignant neoplasm of lower-outer quadrant of left female breast: Secondary | ICD-10-CM | POA: Diagnosis not present

## 2021-07-20 DIAGNOSIS — Z510 Encounter for antineoplastic radiation therapy: Secondary | ICD-10-CM | POA: Diagnosis not present

## 2021-07-20 DIAGNOSIS — Z170 Estrogen receptor positive status [ER+]: Secondary | ICD-10-CM | POA: Diagnosis not present

## 2021-07-21 ENCOUNTER — Other Ambulatory Visit: Payer: Self-pay

## 2021-07-21 ENCOUNTER — Ambulatory Visit
Admission: RE | Admit: 2021-07-21 | Discharge: 2021-07-21 | Disposition: A | Discharge status: Home / Self Care | Payer: Medicare HMO | Source: Ambulatory Visit | Attending: Radiation Oncology | Admitting: Radiation Oncology

## 2021-07-21 DIAGNOSIS — C50512 Malignant neoplasm of lower-outer quadrant of left female breast: Secondary | ICD-10-CM | POA: Diagnosis not present

## 2021-07-21 DIAGNOSIS — Z170 Estrogen receptor positive status [ER+]: Secondary | ICD-10-CM | POA: Diagnosis not present

## 2021-07-21 DIAGNOSIS — Z510 Encounter for antineoplastic radiation therapy: Secondary | ICD-10-CM | POA: Diagnosis not present

## 2021-07-24 ENCOUNTER — Ambulatory Visit: Payer: Medicare HMO

## 2021-07-24 ENCOUNTER — Other Ambulatory Visit: Payer: Self-pay

## 2021-07-24 ENCOUNTER — Ambulatory Visit
Admission: RE | Admit: 2021-07-24 | Discharge: 2021-07-24 | Disposition: A | Discharge status: Home / Self Care | Payer: Medicare HMO | Source: Ambulatory Visit | Attending: Radiation Oncology | Admitting: Radiation Oncology

## 2021-07-24 DIAGNOSIS — Z510 Encounter for antineoplastic radiation therapy: Secondary | ICD-10-CM | POA: Diagnosis not present

## 2021-07-24 DIAGNOSIS — Z170 Estrogen receptor positive status [ER+]: Secondary | ICD-10-CM | POA: Diagnosis not present

## 2021-07-24 DIAGNOSIS — C50512 Malignant neoplasm of lower-outer quadrant of left female breast: Secondary | ICD-10-CM | POA: Diagnosis not present

## 2021-07-25 ENCOUNTER — Ambulatory Visit
Admission: RE | Admit: 2021-07-25 | Discharge: 2021-07-25 | Disposition: A | Discharge status: Home / Self Care | Payer: Medicare HMO | Source: Ambulatory Visit | Attending: Radiation Oncology | Admitting: Radiation Oncology

## 2021-07-25 ENCOUNTER — Ambulatory Visit: Payer: Medicare HMO

## 2021-07-25 DIAGNOSIS — Z510 Encounter for antineoplastic radiation therapy: Secondary | ICD-10-CM | POA: Diagnosis not present

## 2021-07-25 DIAGNOSIS — C50512 Malignant neoplasm of lower-outer quadrant of left female breast: Secondary | ICD-10-CM | POA: Diagnosis not present

## 2021-07-25 DIAGNOSIS — Z170 Estrogen receptor positive status [ER+]: Secondary | ICD-10-CM | POA: Diagnosis not present

## 2021-07-26 ENCOUNTER — Other Ambulatory Visit: Payer: Self-pay

## 2021-07-26 ENCOUNTER — Ambulatory Visit
Admission: RE | Admit: 2021-07-26 | Discharge: 2021-07-26 | Disposition: A | Discharge status: Home / Self Care | Payer: Medicare HMO | Source: Ambulatory Visit | Attending: Radiation Oncology | Admitting: Radiation Oncology

## 2021-07-26 ENCOUNTER — Ambulatory Visit: Payer: Medicare HMO | Admitting: Hematology

## 2021-07-26 ENCOUNTER — Ambulatory Visit: Payer: Medicare HMO

## 2021-07-26 DIAGNOSIS — Z510 Encounter for antineoplastic radiation therapy: Secondary | ICD-10-CM | POA: Diagnosis not present

## 2021-07-26 DIAGNOSIS — C50512 Malignant neoplasm of lower-outer quadrant of left female breast: Secondary | ICD-10-CM | POA: Diagnosis not present

## 2021-07-26 DIAGNOSIS — Z170 Estrogen receptor positive status [ER+]: Secondary | ICD-10-CM | POA: Diagnosis not present

## 2021-07-27 ENCOUNTER — Ambulatory Visit
Admission: RE | Admit: 2021-07-27 | Discharge: 2021-07-27 | Disposition: A | Discharge status: Home / Self Care | Payer: Medicare HMO | Source: Ambulatory Visit | Attending: Radiation Oncology | Admitting: Radiation Oncology

## 2021-07-27 ENCOUNTER — Encounter: Payer: Self-pay | Admitting: *Deleted

## 2021-07-27 DIAGNOSIS — C50512 Malignant neoplasm of lower-outer quadrant of left female breast: Secondary | ICD-10-CM | POA: Diagnosis not present

## 2021-07-27 DIAGNOSIS — Z510 Encounter for antineoplastic radiation therapy: Secondary | ICD-10-CM | POA: Diagnosis not present

## 2021-07-27 DIAGNOSIS — Z170 Estrogen receptor positive status [ER+]: Secondary | ICD-10-CM | POA: Diagnosis not present

## 2021-07-28 ENCOUNTER — Other Ambulatory Visit: Payer: Self-pay

## 2021-07-28 ENCOUNTER — Ambulatory Visit
Admission: RE | Admit: 2021-07-28 | Discharge: 2021-07-28 | Disposition: A | Discharge status: Home / Self Care | Payer: Medicare HMO | Source: Ambulatory Visit | Attending: Radiation Oncology | Admitting: Radiation Oncology

## 2021-07-28 ENCOUNTER — Ambulatory Visit: Payer: Medicare HMO

## 2021-07-28 DIAGNOSIS — Z170 Estrogen receptor positive status [ER+]: Secondary | ICD-10-CM | POA: Diagnosis not present

## 2021-07-28 DIAGNOSIS — Z510 Encounter for antineoplastic radiation therapy: Secondary | ICD-10-CM | POA: Diagnosis not present

## 2021-07-28 DIAGNOSIS — C50512 Malignant neoplasm of lower-outer quadrant of left female breast: Secondary | ICD-10-CM | POA: Diagnosis not present

## 2021-07-31 ENCOUNTER — Encounter: Payer: Self-pay | Admitting: Radiation Oncology

## 2021-07-31 ENCOUNTER — Ambulatory Visit
Admission: RE | Admit: 2021-07-31 | Discharge: 2021-07-31 | Disposition: A | Discharge status: Home / Self Care | Payer: Medicare HMO | Source: Ambulatory Visit | Attending: Radiation Oncology | Admitting: Radiation Oncology

## 2021-07-31 ENCOUNTER — Other Ambulatory Visit: Payer: Self-pay

## 2021-07-31 ENCOUNTER — Inpatient Hospital Stay: Payer: Medicare HMO | Attending: Hematology | Admitting: Hematology

## 2021-07-31 DIAGNOSIS — C50512 Malignant neoplasm of lower-outer quadrant of left female breast: Secondary | ICD-10-CM | POA: Diagnosis not present

## 2021-07-31 DIAGNOSIS — Z170 Estrogen receptor positive status [ER+]: Secondary | ICD-10-CM | POA: Diagnosis not present

## 2021-07-31 DIAGNOSIS — Z510 Encounter for antineoplastic radiation therapy: Secondary | ICD-10-CM | POA: Diagnosis not present

## 2021-07-31 NOTE — Progress Notes (Incomplete)
°Yvonne [name]   °[phone] [phone]   °Clinic Follow up Note  ° °Patient Care Team: °Brown, Carina M, MD as PCP - General (Family Medicine) °Nahser, Philip J, MD as PCP - Cardiology (Cardiology) °Sykes, Jean C, RD as Dietitian (Family Medicine) °Stuart, Dawn C, RN as Oncology Nurse Navigator °Martini, Keisha N, RN as Oncology Nurse Navigator °Cornett, Thomas, MD as Consulting Physician (General Surgery) °Feng, Yan, MD as Consulting Physician (Hematology) °Squire, Sarah, MD as Attending Physician (Radiation Oncology) ° °Date of Service:  07/31/2021 ° °I connected with Yvonne [name] on 07/31/2021 at 11:40 AM EST by {Blank single:[id number]::"video enabled telemedicine visit","telephone visit"} and verified that I am speaking with the correct person using two identifiers.  °I discussed the limitations, risks, security and privacy concerns of performing an evaluation and management service by telephone and the availability of in person appointments. I also discussed with the patient that there may be a patient responsible charge related to this service. The patient expressed understanding and agreed to proceed.  ° °Other persons participating in the visit and their role in the encounter:  *** ° °Patient's location:  *** °Provider's location:  *** ° °CHIEF COMPLAINT: f/u of left breast cancer ° °CURRENT THERAPY:  °*** ° °ASSESSMENT & PLAN: *** °Yvonne [name] is a 80 y.o. female with  ° °*** ° ° °*** ° °No problem-specific Assessment & Plan notes found for this encounter. ° ° ° °SUMMARY OF ONCOLOGIC HISTORY: °Oncology History Overview Note  °Cancer Staging °Malignant neoplasm of lower-outer quadrant of left breast of female, estrogen receptor positive (HCC) °Staging form: Breast, AJCC 8th Edition °- Clinical stage from 04/25/2021: Stage IIA (cT3, cN0, cM0, G1, ER+, PR+, HER2-) - Signed by Feng, Yan, MD on 04/26/2021 ° °  °Malignant neoplasm of lower-outer quadrant of left breast of female,  estrogen receptor positive (HCC)  °04/05/2021 Mammogram  ° Exam: °3D Mammogram Diagnostic Bilateral Digital - Bilateral °Breast Ultrasound - Limited Bilateral ° °IMPRESSION: °The irregular mass in the left breast is highly suspicious of malignancy. °  °04/19/2021 Pathology Results  ° Diagnosis ° °Breast, left, needle core biopsy, mass 7x7x4cm BIRADS5 ° - INVASIVE MAMMARY CARCINOMA ° - MAMMARY CARCINOMA IN SITU °Microscopic Comment °The biopsy material shows an infiltrative proliferation of cells arranged linearly and in small clusters. Based on the biopsy, the carcinoma appears Nottingham grade 1-2 pf 3 and measures 1.6 cm in greatest linear extent. ° °E-cadherin is POSITIVE supporting a ductal origin ° °PROGNOSTIC INDICATORS °Results: °The tumor cells are EQUIVOCAL for Her2 (2+). Her2 by FISH will be performed and results reported separately. °Estrogen Receptor: 100%, POSITIVE, STRONG STAINING INTENSITY °Progesterone Receptor: 100%, POSITIVE, STRONG STAINING INTENSITY °Proliferation Marker Ki67: 5% ° °FLUORESCENCE IN-SITU HYBRIDIZATION °Results: °GROUP 5: HER2 **NEGATIVE** °  °04/24/2021 Initial Diagnosis  ° Malignant neoplasm of lower-outer quadrant of left breast of female, estrogen receptor positive (HCC) °  °04/25/2021 Cancer Staging  ° Staging form: Breast, AJCC 8th Edition °- Clinical stage from 04/25/2021: Stage IIA (cT3, cN0, cM0, G1, ER+, PR+, HER2-) - Signed by Feng, Yan, MD on 04/26/2021 °Stage prefix: Initial diagnosis °Histologic grading system: 3 grade system ° °  °05/17/2021 Cancer Staging  ° Staging form: Breast, AJCC 8th Edition °- Pathologic stage from 05/17/2021: Stage IB (pT3, pN0, cM0, G2, ER+, PR+, HER2-) - Signed by Feng, Yan, MD on 07/29/2021 °Stage prefix: Initial diagnosis °Multigene prognostic tests performed: None °Histologic grading system: 3 grade system °Residual tumor (R): R0 - None ° °  ° ° ° °  INTERVAL HISTORY: *** °Yvonne [name] was contacted for a follow up of ***. {Sh/H}e was last  seen by me on ***.  °  °All other systems were reviewed with the patient and are negative. ° °MEDICAL HISTORY:  °Past Medical History:  °Diagnosis Date  ° Anemia   ° Anxiety   ° Aortic stenosis   ° severe by 2021 echo   ° Arthritis   ° Bilateral carotid artery disease (HCC) 02/04/2017  ° Carotid US 3/22: R 1-39; L 100  ° Depression, recurrent (HCC) 02/10/2019  ° Hypertension   ° Hypothyroidism   ° Impaired functional mobility, balance, gait, and endurance 09/02/2015  ° Malignant neoplasm of lower-outer quadrant of left breast of female, estrogen receptor positive (HCC) 04/24/2021  ° Mild cognitive impairment with memory loss 09/02/2015  ° Osteopenia   ° Pre-diabetes   ° Stroke (HCC)   ° pt was 40  ° ° °SURGICAL HISTORY: °Past Surgical History:  °Procedure Laterality Date  ° ABDOMINAL HYSTERECTOMY    ° BACK SURGERY    ° 2019  ° BRAIN SURGERY    ° age 48's  ° BREAST LUMPECTOMY WITH RADIOACTIVE SEED AND SENTINEL LYMPH NODE BIOPSY Left 05/17/2021  ° Procedure: LEFT BREAST SEED LOCALIZED LUMPECTOMY (BRACKETED) WITH SENTINEL LYMPH NODE BIOPSY;  Surgeon: Cornett, Thomas, MD;  Location: [facility] OR;  Service: General;  Laterality: Left;  PEC BLOCK °90 MINUTES ROOM 9  ° RE-EXCISION OF BREAST LUMPECTOMY Left 06/08/2021  ° Procedure: RE-EXCISION OF LEFT BREAST LUMPECTOMY;  Surgeon: Cornett, Thomas, MD;  Location: [facility];  Service: General;  Laterality: Left;  ° TOE AMPUTATION  2013  ° 2nd toe on right foot, hammer toe  ° TONSILLECTOMY    ° TOTAL HIP ARTHROPLASTY Left 03/19/2017  ° Procedure: LEFT TOTAL HIP ARTHROPLASTY ANTERIOR APPROACH;  Surgeon: Blackman, Christopher Y, MD;  Location: [facility] OR;  Service: Orthopedics;  Laterality: Left;  ° ° °I have reviewed the social history and family history with the patient and they are unchanged from previous note. ° °ALLERGIES:  has No Known Allergies. ° °MEDICATIONS:  °Current Outpatient Medications  °Medication Sig Dispense Refill  ° amitriptyline (ELAVIL) 50 MG tablet Take 1 tablet (50 mg  total) by mouth at bedtime. 90 tablet 3  ° amLODipine (NORVASC) 5 MG tablet Take 1 tablet (5 mg total) by mouth at bedtime. 90 tablet 3  ° Ascorbic Acid (VITAMIN C GUMMIE PO) Take 1 tablet by mouth daily.    ° aspirin 81 MG chewable tablet Chew 1 tablet (81 mg total) by mouth daily. 90 tablet 3  ° atorvastatin (LIPITOR) 20 MG tablet Take 1 tablet (20 mg total) by mouth daily. 90 tablet 3  ° benazepril (LOTENSIN) 40 MG tablet Take 1 tablet (40 mg total) by mouth daily. 90 tablet 3  ° gabapentin (NEURONTIN) 100 MG capsule 1 tablet in the morning and 2 at night 270 capsule 3  ° ibuprofen (ADVIL) 800 MG tablet Take 1 tablet (800 mg total) by mouth every 8 (eight) hours as needed. 30 tablet 0  ° levothyroxine (SYNTHROID) 88 MCG tablet Take 1 tablet (88 mcg total) by mouth every morning. 30 minutes before food 90 tablet 3  ° Misc Natural Products (OSTEO BI-FLEX ADV TRIPLE ST) TABS Take 1 tablet by mouth daily. 60mg of Vitamin C  (Ascorbic Acid); 2mg Manganese (Manganese Sulfate); 35mg Sodium; 1500mg Glucosamine HCI; 100mg 5-Loxin (Boswellia Serrata Extract resin); 1103mg (Chondroitin/MSM Complex (Chrondroitin Sulfate, Methylsulfonylmethane, Collagen (Hydrolyzed Gelatin), Boswellia Serrata (resin); Boron (Bororganic   Glycine); Hyaluronic Acid    ° Multiple Vitamins-Minerals (CENTRUM SILVER ADULT 50+) TABS Take 1 tablet by mouth daily.    ° oxymetazoline (AFRIN) 0.05 % nasal spray Place 1 spray into both nostrils 2 (two) times daily as needed for congestion.    ° VITAMIN E/D-ALPHA PO Take 1 tablet by mouth daily.    ° Zoster Vaccine Adjuvanted (SHINGRIX) injection Inject 0.5 mLs into the muscle every 2 (two) months for 2 doses. 1 mL 0  ° °No current facility-administered medications for this visit.  ° ° °PHYSICAL EXAMINATION: °ECOG PERFORMANCE STATUS: {CHL ONC ECOG PS:[id number]} ° °There were no vitals filed for this visit. °Wt Readings from Last 3 Encounters:  °07/17/21 163 lb 3.2 oz (74 kg)  °06/30/21 164 lb 3.2 oz (74.5  kg)  °06/27/21 164 lb 2 oz (74.4 kg)  °  °*** °No vitals taken today, Exam not performed today ° °LABORATORY DATA:  °I have reviewed the data as listed °CBC Latest Ref Rng & Units 05/31/2021 05/09/2021 04/26/2021  °WBC 4.0 - 10.5 K/uL 11.0(H) 11.5(H) 9.7  °Hemoglobin 12.0 - 15.0 g/dL 13.5 13.9 13.5  °Hematocrit 36.0 - 46.0 % 41.0 42.0 40.3  °Platelets 150 - 400 K/uL 359 390 359  ° ° ° °CMP Latest Ref Rng & Units 05/31/2021 05/09/2021 04/26/2021  °Glucose 70 - 99 mg/dL 137(H) 172(H) 251(H)  °BUN 8 - 23 mg/dL 13 22 15  °Creatinine 0.44 - 1.00 mg/dL 0.63 0.72 0.84  °Sodium 135 - 145 mmol/L 136 135 136  °Potassium 3.5 - 5.1 mmol/L 3.7 4.1 3.8  °Chloride 98 - 111 mmol/L 100 102 101  °CO2 22 - 32 mmol/L 27 25 23  °Calcium 8.9 - 10.3 mg/dL 9.1 8.9 8.9  °Total Protein 6.5 - 8.1 g/dL - - 7.4  °Total Bilirubin 0.3 - 1.2 mg/dL - - 0.3  °Alkaline Phos 38 - 126 U/L - - 86  °AST 15 - 41 U/L - - 25  °ALT 0 - 44 U/L - - 25  ° ° ° ° °RADIOGRAPHIC STUDIES: °I have personally reviewed the radiological images as listed and agreed with the findings in the report. °No results found.  ° ° °No orders of the defined types were placed in this encounter. ° °All questions were answered. The patient knows to call the clinic with any problems, questions or concerns. No barriers to learning was detected. °The total time spent in the appointment was {CHL ONC TIME VISIT - SWIFT:[id number]}. ° °  ° Kathryn L Daubenspeck °07/31/2021  ° °I, Katie Daubenspeck, am acting as scribe for Yan Feng, MD.  ° °{Add scribe attestation statement}  ° ° °

## 2021-08-04 ENCOUNTER — Telehealth: Payer: Self-pay | Admitting: Hematology

## 2021-08-04 NOTE — Telephone Encounter (Signed)
Sch per 2/16 inbasket, pt aware

## 2021-08-08 ENCOUNTER — Other Ambulatory Visit: Payer: Self-pay | Admitting: *Deleted

## 2021-08-08 DIAGNOSIS — I779 Disorder of arteries and arterioles, unspecified: Secondary | ICD-10-CM

## 2021-08-08 DIAGNOSIS — E038 Other specified hypothyroidism: Secondary | ICD-10-CM

## 2021-08-08 DIAGNOSIS — Z8673 Personal history of transient ischemic attack (TIA), and cerebral infarction without residual deficits: Secondary | ICD-10-CM

## 2021-08-08 DIAGNOSIS — I10 Essential (primary) hypertension: Secondary | ICD-10-CM

## 2021-08-08 DIAGNOSIS — G629 Polyneuropathy, unspecified: Secondary | ICD-10-CM

## 2021-08-08 MED ORDER — ATORVASTATIN CALCIUM 20 MG PO TABS: 20.0000 mg | ORAL_TABLET | Freq: Every day | ORAL | 3 refills | Status: DC

## 2021-08-08 MED ORDER — GABAPENTIN 100 MG PO CAPS: ORAL_CAPSULE | ORAL | 3 refills | Status: DC

## 2021-08-08 MED ORDER — LEVOTHYROXINE SODIUM 88 MCG PO TABS: 88.0000 ug | ORAL_TABLET | ORAL | 3 refills | Status: DC

## 2021-08-08 MED ORDER — AMLODIPINE BESYLATE 5 MG PO TABS: 5.0000 mg | ORAL_TABLET | Freq: Every day | ORAL | 3 refills | Status: DC

## 2021-08-15 ENCOUNTER — Encounter (HOSPITAL_COMMUNITY): Payer: Self-pay

## 2021-08-16 NOTE — Progress Notes (Signed)
° °                                                                                                                                                          °  Patient Name: Yvonne [name] MRN: [id number] DOB: Nov 13, 1940 Referring Physician: Owens Shark CARINA M Date of Service: 07/31/2021 [facility] [place]                                                        End Of Treatment Note  Diagnoses: C50.512-Malignant neoplasm of lower-outer quadrant of left female breast  Cancer Staging:  Cancer Staging  Malignant neoplasm of lower-outer quadrant of left breast of female, estrogen receptor positive ([place]) Staging form: Breast, AJCC 8th Edition - Clinical stage from 04/25/2021: Stage IIA (cT3, cN0, cM0, G1, ER+, PR+, HER2-) - Signed by Truitt Merle, MD on 04/26/2021 Stage prefix: Initial diagnosis Histologic grading system: 3 grade system - Pathologic stage from 05/17/2021: Stage IB (pT3, pN0, cM0, G2, ER+, PR+, HER2-) - Signed by Truitt Merle, MD on 07/29/2021 Stage prefix: Initial diagnosis Multigene prognostic tests performed: None Histologic grading system: 3 grade system Residual tumor (R): R0 - None  Intent: Curative  Radiation Treatment Dates: 07/03/2021 through 07/31/2021 Site Technique Total Dose (Gy) Dose per Fx (Gy) Completed Fx Beam Energies  Breast, Left: Breast_L 3D 40.05/40.05 2.67 15/15 6XFFF, 10XFFF  Breast, Left: Breast_L_Bst 3D 10/10 2 5/5 6X, 10X   Narrative: The patient tolerated radiation therapy relatively well.   Plan: The patient will follow-up with radiation oncology in 39mo  -----------------------------------  SEppie Gibson MD

## 2021-08-31 ENCOUNTER — Telehealth: Payer: Self-pay | Admitting: *Deleted

## 2021-08-31 ENCOUNTER — Ambulatory Visit: Payer: Medicare HMO | Admitting: Hematology

## 2021-09-01 ENCOUNTER — Encounter: Payer: Self-pay | Admitting: Hematology

## 2021-09-01 ENCOUNTER — Other Ambulatory Visit: Payer: Self-pay

## 2021-09-01 ENCOUNTER — Inpatient Hospital Stay: Payer: Medicare HMO | Attending: Hematology | Admitting: Hematology

## 2021-09-01 VITALS — BP 158/67 | HR 83 | Temp 98.7°F | Resp 18 | Wt 165.5 lb

## 2021-09-01 DIAGNOSIS — Z79810 Long term (current) use of selective estrogen receptor modulators (SERMs): Secondary | ICD-10-CM | POA: Diagnosis not present

## 2021-09-01 DIAGNOSIS — M8580 Other specified disorders of bone density and structure, unspecified site: Secondary | ICD-10-CM | POA: Diagnosis not present

## 2021-09-01 DIAGNOSIS — Z79899 Other long term (current) drug therapy: Secondary | ICD-10-CM | POA: Insufficient documentation

## 2021-09-01 DIAGNOSIS — Z170 Estrogen receptor positive status [ER+]: Secondary | ICD-10-CM | POA: Diagnosis not present

## 2021-09-01 DIAGNOSIS — Z923 Personal history of irradiation: Secondary | ICD-10-CM | POA: Diagnosis not present

## 2021-09-01 DIAGNOSIS — C50512 Malignant neoplasm of lower-outer quadrant of left female breast: Secondary | ICD-10-CM | POA: Insufficient documentation

## 2021-09-01 DIAGNOSIS — Z7982 Long term (current) use of aspirin: Secondary | ICD-10-CM | POA: Diagnosis not present

## 2021-09-01 MED ORDER — TAMOXIFEN CITRATE 20 MG PO TABS: 20.0000 mg | ORAL_TABLET | Freq: Every day | ORAL | 3 refills | Status: DC

## 2021-09-01 NOTE — Progress Notes (Signed)
?[facility]   ?[phone] [phone]   ?Clinic Follow up Note  ? ?Patient Care Team: ?Martyn Malay, MD as PCP - General (Family Medicine) ?Nahser, Wonda Cheng, MD as PCP - Cardiology (Cardiology) ?Kennith Center, RD as Dietitian (Family Medicine) ?Mauro Kaufmann, RN as Oncology Nurse Navigator ?Rockwell Germany, RN as Oncology Nurse Navigator ?Erroll Luna, MD as Consulting Physician (General Surgery) ?Truitt Merle, MD as Consulting Physician (Hematology) ?Eppie Gibson, MD as Attending Physician (Radiation Oncology) ? ?Date of Service:  09/01/2021 ? ?CHIEF COMPLAINT: f/u of left breast cancer ? ?CURRENT THERAPY:  ?To start tamoxifen 08/2021 ? ?ASSESSMENT & PLAN:  ?Yvonne [name] is a 81 y.o. post-hysterectomy female with  ? ?1. 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 Biopsy on 04/19/21 confirmed invasive and in situ ductal carcinoma. ?-left lumpectomy on 05/17/21 by Dr. Brantley Stage showed: 5.5 cm IDC with DCIS. Final lateral margin was positive. Other margins and lymph nodes negative. ?-she received radiation under Dr. Isidore Moos 1/16-2/13/23. ?-Giving the strong ER and PR expression in her postmenopausal status, I recommend adjuvant endocrine therapy with antiestrogen therapy for a total of 5-10 years to reduce the risk of cancer recurrence. Given her osteopenia, and arthralgia, I recommend tamoxifen. ?---The potential side effects, which includes but not limited to, hot flash, skin and vaginal dryness, slightly increased risk of cardiovascular disease and cataract, small risk of thrombosis and endometrial cancer, were discussed with her in great details. Preventive strategies for thrombosis, such as being physically active, using compression stocks, avoid cigarette smoking, etc., were reviewed with her. I also recommend her to follow-up with her gynecologist once a year, and  watch for vaginal spotting or bleeding, as a clinically sign of endometrial cancer, etc. She voiced good understanding, and agrees to proceed.  ?-We also discussed the breast cancer surveillance after her surgery. She will continue annual screening mammogram, self exam, and a routine office visit with lab and exam with [place]. ?-I encouraged her to have healthy diet and exercise regularly.  ?  ?2. Bone Health ?-repeat DEXA on 05/01/21 showed osteopenia (T-score of -2.0). ?-we discussed tamoxifen can strengthen her bones. ?  ?  ?PLAN:  ?-she will start tamoxifen when she receives it ?-survivorship in 3 months ?-lab and f/u in 6 months ? ? ?No problem-specific Assessment & Plan notes found for this encounter. ? ? ?SUMMARY OF ONCOLOGIC HISTORY: ?Oncology History Overview Note  ?Cancer Staging ?Malignant neoplasm of lower-outer quadrant of left breast of female, estrogen receptor positive ([place]) ?Staging form: Breast, AJCC 8th Edition ?- Clinical stage from 04/25/2021: Stage IIA (cT3, cN0, cM0, G1, ER+, PR+, HER2-) - Signed by Truitt Merle, MD on 04/26/2021 ? ?  ?Malignant neoplasm of lower-outer quadrant of left breast of female, estrogen receptor positive ([place])  ?04/05/2021 Mammogram  ? Exam: ?3D Mammogram Diagnostic Bilateral Digital - Bilateral ?Breast Ultrasound - Limited Bilateral ? ?IMPRESSION: ?The irregular mass in the left breast is highly suspicious of malignancy. ?  ?04/19/2021 Pathology Results  ? Diagnosis ? ?Breast, left, needle core biopsy, mass 7x7x4cm BIRADS5 ? - INVASIVE MAMMARY CARCINOMA ? - MAMMARY CARCINOMA IN SITU ?Microscopic Comment ?The biopsy material shows an infiltrative proliferation of cells arranged linearly and in small clusters. Based on the biopsy, the carcinoma appears Nottingham grade 1-2 pf 3 and measures 1.6 cm in greatest linear extent. ? ?E-cadherin is POSITIVE supporting a ductal origin ? ?  PROGNOSTIC INDICATORS ?Results: ?The tumor cells are EQUIVOCAL for Her2 (2+). Her2 by FISH will be  performed and results reported separately. ?Estrogen Receptor: 100%, POSITIVE, STRONG STAINING INTENSITY ?Progesterone Receptor: 100%, POSITIVE, STRONG STAINING INTENSITY ?Proliferation Marker Ki67: 5% ? ?FLUORESCENCE IN-SITU HYBRIDIZATION ?Results: ?GROUP 5: HER2 **NEGATIVE** ?  ?04/24/2021 Initial Diagnosis  ? Malignant neoplasm of lower-outer quadrant of left breast of female, estrogen receptor positive ([place]) ?  ?04/25/2021 Cancer Staging  ? Staging form: Breast, AJCC 8th Edition ?- Clinical stage from 04/25/2021: Stage IIA (cT3, cN0, cM0, G1, ER+, PR+, HER2-) - Signed by Truitt Merle, MD on 04/26/2021 ?Stage prefix: Initial diagnosis ?Histologic grading system: 3 grade system ? ?  ?05/17/2021 Cancer Staging  ? Staging form: Breast, AJCC 8th Edition ?- Pathologic stage from 05/17/2021: Stage IB (pT3, pN0, cM0, G2, ER+, PR+, HER2-) - Signed by Truitt Merle, MD on 07/29/2021 ?Stage prefix: Initial diagnosis ?Multigene prognostic tests performed: None ?Histologic grading system: 3 grade system ?Residual tumor (R): R0 - None ? ?  ?05/17/2021 Definitive Surgery  ? FINAL MICROSCOPIC DIAGNOSIS:  ? ?A. BREAST, LEFT, LUMPECTOMY:  ?- Invasive ductal carcinoma, grade 2, 5.5 cm in maximal extent,  ?involving superior, inferior, and lateral margins.  ?- Anterior margin free.  Tumor approaches to less than 0.1 cm of inked margin.  ?- Medial margin widely free of tumor.  ?- Posterior margin free.  Tumor approaches to 0.2 cm of inked margin.  ?- Ductal carcinoma in situ.  ? ?B. BREAST, LEFT ADDITIONAL LATERAL MARGIN, EXCISION:  ?- Invasive ductal carcinoma involving new lateral margin.  ?- Usual ductal hyperplasia and fibrocystic changes.  ?- Intraductal papilloma.  ?- Old fibroadenoma.  ? ?C. BREAST, LEFT ADDITIONAL SUPERIOR MARGIN, EXCISION:  ?- No carcinoma identified.  New superior margin free.  ?- Lobular neoplasia (atypical lobular hyperplasia).  ?- Complex sclerosing lesion.  ?- Fibrocystic changes and usual ductal hyperplasia.   ? ?D. BREAST, LEFT ADDITIONAL MEDIAL MARGIN, EXCISION:  ?- New medial margin free.  Invasive ductal carcinoma approaches to 0.8 cm of inked margin.  ? ?E. BREAST, LEFT ADDITIONAL INFERIOR MARGIN, EXCISION:  ?- Small fragment of tissue suspicious for malignancy, 0.2 cm from inked margin.  ?- New inferior margin free.  ? ?F. BREAST, LEFT ADDITIONAL POSTERIOR MARGIN, EXCISION:  ?- New posterior margin free.  Invasive ductal carcinoma approaches to 0.2 cm of inked margin.  ? ?G. BREAST, LEFT ADDITIONAL ANTERIOR MARGIN, EXCISION:  ?- No malignancy identified.  ? ?H. LYMPH NODE, LEFT AXILLARY, SENTINEL, EXCISION:  ?- One lymph node, negative for carcinoma (0/1).  ? ?I. LYMPH NODE, LEFT AXILLARY, SENTINEL, EXCISION:  ?- One lymph node, negative for carcinoma (0/1).  ? ?J. LYMPH NODE, LEFT AXILLARY, SENTINEL, EXCISION:  ?- One lymph node, negative for carcinoma (0/1).  ? ?COMMENT:  ? ?The final (new) lateral resection margin is positive for carcinoma.  All other final (new) margins are negative.  ?  ?07/03/2021 - 07/31/2021 Radiation Therapy  ? Site Technique Total Dose (Gy) Dose per Fx (Gy) Completed Fx Beam Energies  ?Breast, Left: Breast_L 3D 40.05/40.05 2.67 15/15 6XFFF, 10XFFF  ?Breast, Left: Breast_L_Bst 3D 10/10 2 5/5 6X, 10X  ? ? ?  ? ? ? ?INTERVAL HISTORY:  ?Yvonne [name] is here for a follow up of breast cancer. She was last seen by me on 04/26/21 in consultation. She presents to the clinic accompanied by her caregiver. ?She reports she tolerated radiation well overall with some expected skin burning. She notes she is recovering  well. She reports some residual, occasional shooting pains, as expected. ?  ?All other systems were reviewed with the patient and are negative. ? ?MEDICAL HISTORY:  ?Past Medical History:  ?Diagnosis Date  ? Anemia   ? Anxiety   ? Aortic stenosis   ? severe by 2021 echo   ? Arthritis   ? Bilateral carotid artery disease ([place]) 02/04/2017  ? Carotid US 3/22: R 1-39; L 100  ? Depression,  recurrent ([place]) 02/10/2019  ? Hypertension   ? Hypothyroidism   ? Impaired functional mobility, balance, gait, and endurance 09/02/2015  ? Malignant neoplasm of lower-outer quadrant of left breast of female, estroge

## 2021-09-08 ENCOUNTER — Other Ambulatory Visit: Payer: Self-pay

## 2021-09-08 ENCOUNTER — Encounter: Payer: Self-pay | Admitting: Radiation Oncology

## 2021-09-08 ENCOUNTER — Ambulatory Visit
Admission: RE | Admit: 2021-09-08 | Discharge: 2021-09-08 | Disposition: A | Discharge status: Home / Self Care | Payer: Medicare HMO | Source: Ambulatory Visit | Attending: Radiation Oncology | Admitting: Radiation Oncology

## 2021-09-08 VITALS — BP 158/76 | HR 87 | Temp 97.1°F | Resp 18 | Ht 61.0 in | Wt 165.1 lb

## 2021-09-08 DIAGNOSIS — Z791 Long term (current) use of non-steroidal anti-inflammatories (NSAID): Secondary | ICD-10-CM | POA: Insufficient documentation

## 2021-09-08 DIAGNOSIS — Z923 Personal history of irradiation: Secondary | ICD-10-CM | POA: Insufficient documentation

## 2021-09-08 DIAGNOSIS — C50512 Malignant neoplasm of lower-outer quadrant of left female breast: Secondary | ICD-10-CM | POA: Insufficient documentation

## 2021-09-08 DIAGNOSIS — Z79899 Other long term (current) drug therapy: Secondary | ICD-10-CM | POA: Insufficient documentation

## 2021-09-08 DIAGNOSIS — Z7982 Long term (current) use of aspirin: Secondary | ICD-10-CM | POA: Diagnosis not present

## 2021-09-08 DIAGNOSIS — Z170 Estrogen receptor positive status [ER+]: Secondary | ICD-10-CM | POA: Insufficient documentation

## 2021-09-08 DIAGNOSIS — Z79810 Long term (current) use of selective estrogen receptor modulators (SERMs): Secondary | ICD-10-CM | POA: Insufficient documentation

## 2021-09-08 NOTE — Progress Notes (Signed)
Yvonne [name] presents today for follow-up after completing radiation to her left breast on 07/31/2021 ? ?Pain:0 ?Skin: None ?ROM: Mildly limited ROM bilaterally ?Lymphedema: None ?MedOnc F/U: Last saw Dr. Burr Medico on 09/01/2021 ?PLAN:  ?-she will start tamoxifen when she receives it ?-survivorship in 3 months ?-lab and f/u in 6 months ? ?Other issues of note:  ? ?Pt reports Yes No Comments  ?Tamoxifen '[x]'$  '[]'$    ?Letrozole '[x]'$  '[x]'$    ?Anastrazole '[]'$  '[x]'$    ?Mammogram '[x]'$  Date:  '[x]'$  2022  ? ? ?

## 2021-09-08 NOTE — Progress Notes (Signed)
?Radiation Oncology         [phone]) [phone] ?________________________________ ? ?Name: Yvonne [name] MRN: [id number]  ?Date: 09/08/2021  DOB: Mar 21, 1941 ? ?Follow-Up Visit Note ? ?Outpatient ? ?CC: Martyn Malay, MD  Martyn Malay, MD ? ?Diagnosis and Prior Radiotherapy:  ?  ICD-10-CM   ?1. Malignant neoplasm of lower-outer quadrant of left breast of female, estrogen receptor positive ([place])  C50.512   ? Z17.0   ?  ? ? Cancer Staging  ?Malignant neoplasm of lower-outer quadrant of left breast of female, estrogen receptor positive ([place]) ?Staging form: Breast, AJCC 8th Edition ?- Clinical stage from 04/25/2021: Stage IIA (cT3, cN0, cM0, G1, ER+, PR+, HER2-) - Signed by Truitt Merle, MD on 04/26/2021 ?Stage prefix: Initial diagnosis ?Histologic grading system: 3 grade system ?- Pathologic stage from 05/17/2021: Stage IB (pT3, pN0, cM0, G2, ER+, PR+, HER2-) - Signed by Truitt Merle, MD on 07/29/2021 ?Stage prefix: Initial diagnosis ?Multigene prognostic tests performed: None ?Histologic grading system: 3 grade system ?Residual tumor (R): R0 - None ? ? ?CHIEF COMPLAINT: Here for follow-up and surveillance of breast cancer ? ?Narrative:  The patient returns today for routine follow-up.  Ms. Wolter presents today for follow-up after completing radiation to her left breast on 07/31/2021 ? ?Pain:0 ?Skin: None ?ROM: Mildly limited ROM bilaterally ?Lymphedema: None ?MedOnc F/U: Last saw Dr. Burr Medico on 09/01/2021 ?PLAN:  ?-she will start tamoxifen when she receives it ?-survivorship in 3 months ?-lab and f/u in 6 months ? ?Other issues of note:  ? ?Pt reports Yes No Comments  ?Tamoxifen _0  _1    ?Letrozole _2  _3    ?Anastrazole _4  _5    ?Mammogram _6  Date:  _7  2022  ? ?She reports a new rash on the contralateral side, in the right axilla.  It is not pruritic                             ? ?ALLERGIES:  has No Known Allergies. ? ?Meds: ?Current Outpatient Medications  ?Medication Sig Dispense Refill  ? amitriptyline (ELAVIL) 50 MG tablet  Take 1 tablet (50 mg total) by mouth at bedtime. 90 tablet 3  ? amLODipine (NORVASC) 5 MG tablet Take 1 tablet (5 mg total) by mouth at bedtime. 90 tablet 3  ? aspirin 81 MG chewable tablet Chew 1 tablet (81 mg total) by mouth daily. 90 tablet 3  ? atorvastatin (LIPITOR) 20 MG tablet Take 1 tablet (20 mg total) by mouth daily. 90 tablet 3  ? benazepril (LOTENSIN) 40 MG tablet Take 1 tablet (40 mg total) by mouth daily. 90 tablet 3  ? gabapentin (NEURONTIN) 100 MG capsule 1 tablet in the morning and 2 at night 270 capsule 3  ? ibuprofen (ADVIL) 800 MG tablet Take 1 tablet (800 mg total) by mouth every 8 (eight) hours as needed. 30 tablet 0  ? levothyroxine (SYNTHROID) 88 MCG tablet Take 1 tablet (88 mcg total) by mouth every morning. 30 minutes before food 90 tablet 3  ? Misc Natural Products (OSTEO BI-FLEX ADV TRIPLE ST) TABS Take 1 tablet by mouth daily. 71m of Vitamin C  (Ascorbic Acid); 215mManganese (Manganese Sulfate); 3565modium; 1500m43mucosamine HCI; 100mg66moxin (Boswellia Serrata Extract resin); 1103mg 1mndroitin/MSM Complex (Chrondroitin Sulfate, Methylsulfonylmethane, Collagen (Hydrolyzed Gelatin), Boswellia Serrata (resin); Boron (Bororganic Glycine); Hyaluronic Acid    ? Multiple Vitamins-Minerals (CENTRUM SILVER ADULT 50+) TABS Take 1 tablet by mouth daily.    ? oxymetazoline (AFRIN) 0.05 %  nasal spray Place 1 spray into both nostrils 2 (two) times daily as needed for congestion.    ? tamoxifen (NOLVADEX) 20 MG tablet Take 1 tablet (20 mg total) by mouth daily. 30 tablet 3  ? VITAMIN E/D-ALPHA PO Take 1 tablet by mouth daily.    ? ?No current facility-administered medications for this encounter.  ? ? ?Physical Findings: ?The patient is in no acute distress. Patient is alert and oriented. ? height is _0  (1.549 m) and weight is 165 lb 2 oz (74.9 kg). Her temporal temperature is 97.1 ?F (36.2 ?C) (abnormal). Her blood pressure is 158/76 (abnormal) and her pulse is 87. Her respiration is 18 and  oxygen saturation is 95%. Marland Kitchen    ?Satisfactory skin healing in radiotherapy fields of left breast.  In the right axilla there is a bright erythematous rash that is well demarcated. ? ? ?Lab Findings: ?Lab Results  ?Component Value Date  ? WBC 11.0 (H) 05/31/2021  ? HGB 13.5 05/31/2021  ? HCT 41.0 05/31/2021  ? MCV 90.7 05/31/2021  ? PLT 359 05/31/2021  ? ? ?Radiographic Findings: ?No results found. ? ?Impression/Plan: Healing well from radiotherapy to the breast tissue. ? ?Continue skin care with topical Vitamin E Oil and / or lotion for at least 2 more months for further healing to the left breast.  Regarding the bright erythematous rash in the right axilla (outside radiation field as this is contralateral), this could be a yeast infection.  We provided her with a canister of miconazole powder and I instructed her to apply it 3 times a day for 3 weeks.  She and her caregiver know to call her primary doctor if it does not improve within the next couple of weeks.  I wrote down the above instructions on a piece of paper for them. ? ?I encouraged her to continue with yearly mammography as appropriate (for intact breast tissue) and followup with medical oncology. I will see her back on an as-needed basis. I have encouraged her to call if she has any issues or concerns in the future. I wished her the very best. ? ?On date of service, in total, I spent 20 minutes on this encounter. Patient was seen in person.  ?_____________________________________ ? ? ?Eppie Gibson, MD  ?

## 2021-09-11 ENCOUNTER — Ambulatory Visit: Payer: Medicare HMO | Admitting: Rehabilitation

## 2021-09-22 ENCOUNTER — Telehealth: Payer: Self-pay | Admitting: Cardiovascular Disease

## 2021-09-22 ENCOUNTER — Ambulatory Visit (HOSPITAL_COMMUNITY)
Admission: RE | Admit: 2021-09-22 | Payer: Medicare HMO | Source: Ambulatory Visit | Attending: Cardiovascular Disease | Admitting: Cardiovascular Disease

## 2021-09-22 DIAGNOSIS — I779 Disorder of arteries and arterioles, unspecified: Secondary | ICD-10-CM

## 2021-09-22 NOTE — Telephone Encounter (Signed)
Pt called to reschedule appt for a Carotid that was to be done today. I could not reschedule due to order expiring today. Is there anyway order can be extended out for pt to rescheduled? Pt was notified of this and would like a call if she's able to reschedule. Please advise ?

## 2021-09-26 NOTE — Telephone Encounter (Signed)
Replaced expired carotid order with new one.  [place] for patient to be scheduled. ?

## 2021-09-27 ENCOUNTER — Other Ambulatory Visit: Payer: Self-pay | Admitting: *Deleted

## 2021-09-27 MED ORDER — BENAZEPRIL HCL 40 MG PO TABS: 40.0000 mg | ORAL_TABLET | Freq: Every day | ORAL | 3 refills | Status: DC

## 2021-10-04 ENCOUNTER — Other Ambulatory Visit: Payer: Self-pay

## 2021-10-12 ENCOUNTER — Encounter (HOSPITAL_COMMUNITY): Payer: Medicare HMO

## 2021-10-12 ENCOUNTER — Other Ambulatory Visit (HOSPITAL_COMMUNITY): Payer: Medicare HMO

## 2021-10-17 ENCOUNTER — Other Ambulatory Visit: Payer: Self-pay | Admitting: *Deleted

## 2021-10-17 MED ORDER — IBUPROFEN 600 MG PO TABS: 600.0000 mg | ORAL_TABLET | Freq: Three times a day (TID) | ORAL | 0 refills | Status: DC | PRN

## 2021-10-18 ENCOUNTER — Ambulatory Visit (HOSPITAL_COMMUNITY)
Admission: RE | Admit: 2021-10-18 | Discharge: 2021-10-18 | Disposition: A | Discharge status: Home / Self Care | Payer: Medicare HMO | Source: Ambulatory Visit | Attending: Cardiovascular Disease | Admitting: Cardiovascular Disease

## 2021-10-18 DIAGNOSIS — I779 Disorder of arteries and arterioles, unspecified: Secondary | ICD-10-CM | POA: Insufficient documentation

## 2021-10-18 DIAGNOSIS — Z8673 Personal history of transient ischemic attack (TIA), and cerebral infarction without residual deficits: Secondary | ICD-10-CM | POA: Diagnosis not present

## 2021-10-20 ENCOUNTER — Ambulatory Visit (HOSPITAL_COMMUNITY)
Admission: EM | Admit: 2021-10-20 | Discharge: 2021-10-20 | Disposition: A | Discharge status: Home / Self Care | Payer: Medicare HMO | Attending: Physician Assistant | Admitting: Physician Assistant

## 2021-10-20 ENCOUNTER — Ambulatory Visit (INDEPENDENT_AMBULATORY_CARE_PROVIDER_SITE_OTHER): Payer: Medicare HMO

## 2021-10-20 ENCOUNTER — Encounter (HOSPITAL_COMMUNITY): Payer: Self-pay

## 2021-10-20 DIAGNOSIS — M25571 Pain in right ankle and joints of right foot: Secondary | ICD-10-CM

## 2021-10-20 DIAGNOSIS — M7989 Other specified soft tissue disorders: Secondary | ICD-10-CM | POA: Diagnosis not present

## 2021-10-20 DIAGNOSIS — M19071 Primary osteoarthritis, right ankle and foot: Secondary | ICD-10-CM

## 2021-10-20 MED ORDER — MELOXICAM 7.5 MG PO TABS: 7.5000 mg | ORAL_TABLET | Freq: Every day | ORAL | 0 refills | Status: DC

## 2021-10-20 NOTE — Discharge Instructions (Signed)
Your x-ray showed arthritis but did not show any fracture which is great news.  I believe that the arthritis and potentially a sprain is causing your symptoms.  Please use the brace for comfort and support.  Keep your foot elevated and use ice for additional symptom relief.  Take Mobic up to once a day for pain relief.  You should not take NSAIDs including aspirin (it is okay to take your baby aspirin daily), ibuprofen/Advil, naproxen/Aleve with this medication.  If your symptoms do not improving please call and schedule an appointment with [facility].  If anything worsens suddenly return for reevaluation. ?

## 2021-10-20 NOTE — ED Provider Notes (Signed)
?[place] ? ? ? ?CSN: [id number] ?Arrival date & time: 10/20/21  1206 ? ? ?  ? ?History   ?Chief Complaint ?No chief complaint on file. ? ? ?HPI ?Yvonne [name] is a 81 y.o. female.  ? ?Patient presents today with a weeklong history of right ankle pain.  Reports that she was taking out the trash when she took a misstep causing right ankle pain.  This has been persistently painful but is worsened in the past several days prompting evaluation.  Pain is rated 6 on a 0-10 pain scale, localized to medial and lateral right ankle with radiation into leg, described as sharp, worse with ambulation, no alleviating factors notified.  She has tried over-the-counter analgesics including aspirin and Tylenol without improvement of symptoms.  She denies previous injury or surgery involving her ankle.  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 ? ? ?Past Medical History:  ?Diagnosis Date  ? Anemia   ? Anxiety   ? Aortic stenosis   ? severe by 2021 echo   ? Arthritis   ? Bilateral carotid artery disease ([place]) 02/04/2017  ? Carotid US 3/22: R 1-39; L 100  ? Depression, recurrent ([place]) 02/10/2019  ? Hypertension   ? Hypothyroidism   ? Impaired functional mobility, balance, gait, and endurance 09/02/2015  ? Malignant neoplasm of lower-outer quadrant of left breast of female, estrogen receptor positive ([place]) 04/24/2021  ? Mild cognitive impairment with memory loss 09/02/2015  ? Osteopenia   ? Pre-diabetes   ? Stroke [facility])   ? pt was 40  ? ? ?Patient Active Problem List  ? Diagnosis Date Noted  ? Malignant neoplasm of lower-outer quadrant of left breast of female, estrogen receptor positive ([place]) 04/24/2021  ? Counseling regarding advanced care planning and goals of care 09/02/2020  ? Controlled type 2 diabetes mellitus with chronic kidney disease, without long-term current use of insulin ([place]) 11/27/2019  ? Memory impairment 08/31/2019  ? Depression, recurrent  ([place]) 02/10/2019  ? Degenerative spondylolisthesis 06/06/2018  ? Sensorineural hearing loss (SNHL), 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  ? Bilateral carotid artery disease ([place]) 02/04/2017  ? 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 Osteopenia 08/30/2014  ? Aortic stenosis 12/12/2009  ? Hypothyroidism 07/12/2009  ? INSOMNIA, PERSISTENT 07/12/2009  ? Essential hypertension 07/12/2009  ? ? ?Past Surgical History:  ?Procedure Laterality Date  ? ABDOMINAL HYSTERECTOMY    ? BACK SURGERY    ? 2019  ? BRAIN SURGERY    ? age 41's  ? BREAST LUMPECTOMY WITH RADIOACTIVE SEED AND SENTINEL LYMPH NODE BIOPSY Left 05/17/2021  ? Procedure: LEFT BREAST SEED LOCALIZED LUMPECTOMY (BRACKETED) WITH SENTINEL LYMPH NODE BIOPSY;  Surgeon: Erroll Luna, MD;  Location: [place];  Service: General;  Laterality: Left;  PEC BLOCK ?90 MINUTES ROOM 9  ? RE-EXCISION OF BREAST LUMPECTOMY Left 06/08/2021  ? Procedure: RE-EXCISION OF LEFT BREAST LUMPECTOMY;  Surgeon: Erroll Luna, MD;  [facility];  Service: General;  Laterality: Left;  ? TOE AMPUTATION  2013  ? 2nd toe on right foot, hammer toe  ? TONSILLECTOMY    ? TOTAL HIP ARTHROPLASTY Left 03/19/2017  ? Procedure: LEFT TOTAL HIP ARTHROPLASTY ANTERIOR APPROACH;  Surgeon: Mcarthur Rossetti, MD;  Location: [place];  Service: Orthopedics;  Laterality: Left;  ? ? ?OB History   ?No obstetric history on file. ?  ? ? ? ?Home Medications   ? ?Prior to Admission medications   ?Medication Sig  Start Date End Date Taking? Authorizing Provider  ?meloxicam (MOBIC) 7.5 MG tablet Take 1 tablet (7.5 mg total) by mouth daily. 10/20/21  Yes Dasia Guerrier, Derry Skill, PA-C  ?amitriptyline (ELAVIL) 50 MG tablet Take 1 tablet (50 mg total) by mouth at bedtime. 02/23/21   Martyn Malay, MD  ?amLODipine (NORVASC) 5 MG tablet Take 1 tablet (5 mg total) by mouth at bedtime. 08/08/21   Martyn Malay, MD  ?aspirin 81 MG chewable tablet Chew 1 tablet (81 mg total) by mouth daily. 12/23/19   Martyn Malay, MD  ?atorvastatin (LIPITOR) 20 MG tablet Take  1 tablet (20 mg total) by mouth daily. 08/08/21   Martyn Malay, MD  ?benazepril (LOTENSIN) 40 MG tablet Take 1 tablet (40 mg total) by mouth daily. 09/27/21   Martyn Malay, MD  ?gabapentin (NEURONTIN) 100 MG capsule 1 tablet in the morning and 2 at night 08/08/21   Martyn Malay, MD  ?levothyroxine (SYNTHROID) 88 MCG tablet Take 1 tablet (88 mcg total) by mouth every morning. 30 minutes before food 08/08/21   Martyn Malay, MD  ?Misc Natural Products (OSTEO BI-FLEX ADV TRIPLE ST) TABS Take 1 tablet by mouth daily. '60mg'$  of Vitamin C  (Ascorbic Acid); '2mg'$  Manganese (Manganese Sulfate); '35mg'$  Sodium; '1500mg'$  Glucosamine HCI; '100mg'$  5-Loxin (Boswellia Serrata Extract resin); '1103mg'$  (Chondroitin/MSM Complex (Chrondroitin Sulfate, Methylsulfonylmethane, Collagen (Hydrolyzed Gelatin), Boswellia Serrata (resin); Boron (Bororganic Glycine); Hyaluronic Acid    [provider]  ?Multiple Vitamins-Minerals (CENTRUM SILVER ADULT 50+) TABS Take 1 tablet by mouth daily.    [provider]  ?oxymetazoline (AFRIN) 0.05 % nasal spray Place 1 spray into both nostrils 2 (two) times daily as needed for congestion.    [provider]  ?tamoxifen (NOLVADEX) 20 MG tablet Take 1 tablet (20 mg total) by mouth daily. 09/01/21   Truitt Merle, MD  ?VITAMIN E/D-ALPHA PO Take 1 tablet by mouth daily.    [provider]  ? ? ?Family History ?Family History  ?Problem Relation Age of Onset  ? Thyroid disease Mother   ? Heart disease Father   ? ? ?Social History ?Social History  ? ?Tobacco Use  ? Smoking status: Never  ? Smokeless tobacco: Never  ?Vaping Use  ? Vaping Use: Never used  ?Substance Use Topics  ? Alcohol use: Not Currently  ?  Alcohol/week: 1.0 standard drink  ?  Types: 1 Glasses of wine per week  ? Drug use: No  ? ? ? ?Allergies   ?Patient has no known allergies. ? ? ?Review of Systems ?Review of Systems  ?Constitutional:  Positive for activity change. Negative for appetite change, fatigue and fever.   ?Gastrointestinal:  Negative for abdominal pain, diarrhea, nausea and vomiting.  ?Musculoskeletal:  Positive for arthralgias, gait problem and joint swelling. Negative for myalgias.  ?Neurological:  Negative for weakness and numbness.  ? ? ?Physical Exam ?Triage Vital Signs ?ED Triage Vitals [10/20/21 1253]  ?Enc Vitals Group  ?   BP (!) 152/66  ?   Pulse Rate 82  ?   Resp 18  ?   Temp 98.3 ?F (36.8 ?C)  ?   Temp Source Oral  ?   SpO2 99 %  ?   Weight   ?   Height   ?   Head Circumference   ?   Peak Flow   ?   Pain Score   ?   Pain Loc   ?   Pain Edu?   ?  Excl. in [place]?   ? ?No data found. ? ?Updated Vital Signs ?BP (!) 152/66 (BP Location: Left Arm)   Pulse 82   Temp 98.3 ?F (36.8 ?C) (Oral)   Resp 18   SpO2 99%  ? ?Visual Acuity ?Right Eye Distance:   ?Left Eye Distance:   ?Bilateral Distance:   ? ?Right Eye Near:   ?Left Eye Near:    ?Bilateral Near:    ? ?Physical Exam ?Vitals reviewed.  ?Constitutional:   ?   General: She is awake. She is not in acute distress. ?   Appearance: Normal appearance. She is well-developed. She is not ill-appearing.  ?   Comments: Very pleasant female appears stated age in no acute distress sitting comfortably in wheelchair  ?HENT:  ?   Head: Normocephalic and atraumatic.  ?Cardiovascular:  ?   Rate and Rhythm: Normal rate and regular rhythm.  ?   Heart sounds: Normal heart sounds, S1 normal and S2 normal. No murmur heard. ?Pulmonary:  ?   Effort: Pulmonary effort is normal.  ?   Breath sounds: Normal breath sounds. No wheezing, rhonchi or rales.  ?   Comments: Clear to auscultation bilaterally ?Abdominal:  ?   Palpations: Abdomen is soft.  ?   Tenderness: There is no abdominal tenderness.  ?Musculoskeletal:  ?   Right ankle: No swelling. Tenderness present over the lateral malleolus and medial malleolus. Normal range of motion.  ?   Right Achilles Tendon: Normal. No tenderness.  ?   Right foot: Normal range of motion. Deformity (Surgical absence of second toe.) present.  ?Feet:   ?   Right foot:  ?   Protective Sensation: 10 sites tested.  10 sites sensed.  ?   Skin integrity: Skin integrity normal. No ulcer, blister or skin breakdown.  ?Psychiatric:     ?   Behavior: Behavior i

## 2021-10-20 NOTE — ED Triage Notes (Signed)
Pt reports right ankle pain after she step down wrong of her foot on yesterday. ?

## 2021-10-27 ENCOUNTER — Ambulatory Visit (INDEPENDENT_AMBULATORY_CARE_PROVIDER_SITE_OTHER): Payer: Medicare HMO | Admitting: Physician Assistant

## 2021-10-27 ENCOUNTER — Ambulatory Visit (HOSPITAL_COMMUNITY): Payer: Medicare HMO | Attending: Cardiology

## 2021-10-27 VITALS — BP 132/58 | HR 76 | Ht 61.0 in | Wt 164.6 lb

## 2021-10-27 DIAGNOSIS — I062 Rheumatic aortic stenosis with insufficiency: Secondary | ICD-10-CM | POA: Diagnosis not present

## 2021-10-27 DIAGNOSIS — I350 Nonrheumatic aortic (valve) stenosis: Secondary | ICD-10-CM | POA: Diagnosis not present

## 2021-10-27 DIAGNOSIS — Z8673 Personal history of transient ischemic attack (TIA), and cerebral infarction without residual deficits: Secondary | ICD-10-CM | POA: Diagnosis not present

## 2021-10-27 DIAGNOSIS — Z89421 Acquired absence of other right toe(s): Secondary | ICD-10-CM | POA: Diagnosis not present

## 2021-10-27 DIAGNOSIS — I10 Essential (primary) hypertension: Secondary | ICD-10-CM

## 2021-10-27 LAB — ECHOCARDIOGRAM COMPLETE
AR max vel: 1.09 cm2
AV Area VTI: 1.15 cm2
AV Area mean vel: 0.96 cm2
AV Mean grad: 27 mmHg
AV Peak grad: 43 mmHg
Ao pk vel: 3.28 m/s
Area-P 1/2: 3.15 cm2
P 1/2 time: 806 msec
S' Lateral: 2 cm

## 2021-10-27 NOTE — Progress Notes (Signed)
?[facility]   ?[place] ?                                    ?Cardiology Office Note:   ? ?Date:  10/30/2021  ? ?ID:  LUCETTA BAEHR, DOB 12-29-1940, MRN [id number] ? ?PCP:  Martyn Malay, MD  ?[facility] Cardiologist:  Mertie Moores, MD  ?[name] Electrophysiologist:  None  ? ?Referring MD: Martyn Malay, MD  ? ?Follow up moderate AS  ? ?History of Present Illness:   ? ?Yvonne [name] is a 81 y.o. female with a hx of  arthritis, carotid artery disease, borderline diabetes, HTN, hypothyroidism, remote stroke at age 73 and paradoxical low flow/low gradient severe aortic stenosis who is here today for follow up in the structural heart clinic. ? ?She was seen as a new consult in October 2021 for further discussion regarding her aortic stenosis. Her aortic stenosis has been moderate and has been followed by Dr. Acie Fredrickson for several years. She had a stroke at age 74 and is known to have a chronic left internal carotid artery occlusion. Last carotid dopplers in September 2020 with mild RICA stenosis and occluded LICA. She has had right sided arm and leg weakness since her stroke and has neuropathy in both feet. She had a craniotomy in her 27s with removal of a benign tumor.Echo 03/01/20 with LVEF=65-70%, moderate LVH. The aortic valve leaflets are thickened and calcified with mean gradient 33 mmHg, AVA 0.8 cm2 and dimensionless index of 0.23. SVI 38. The valve leaflets are restricted. The valve visually appears to have severe stenosis. She was felt to have paradoxical low flow/low gradient severe aortic stenosis with low dimensionless index, low AVA and borderline stroke volume index.  ? ?She met Dr. Angelena Form in October 2021 and denied chest pain, dyspnea, dizziness, lower extremity edema. No palpitations. She was seen by video visit in January 2022 due to a snow day. She denied any complaints that day, specifically denying any dyspnea at rest or with exertion. In  February 2022 and she was doing well. Echo 09/14/20 with LVEF=65-70%, moderate LVH. Moderate aortic stenosis with mean gradient 25 mmHg, peak gradient 39.20mHg, AVA 1.18 cm2, dimensionless index 0.41, SVI 54. Seen in June 2022 and she was feeling well. Most recent echo October 2022 with LVEF over 75%, moderate aortic stenosis with mean gradient 28 mmHg, peak gradient 528mg, AVA 1.1 cm2, DI 0.37, SVI 41. She was diagnosed with breast cancer and has had left lumpectomy and radiation therapy. She was last seen by Dr. McAngelena Formn the office on 07/17/21 and doing well. Continued follow up was recommended.  ? ?Today the patient presents to clinic for follow up. Here with her friend. No CP or SOB. No LE edema, orthopnea or PND. No dizziness or syncope. No blood in stool or urine. No palpitations. She is not very active but is not limited by any cardiac symptoms.  ? ? ?  ? ?Past Medical History:  ?Diagnosis Date  ? Anemia   ? Anxiety   ? Aortic stenosis   ? severe by 2021 echo   ? Arthritis   ? Bilateral carotid artery disease ([place]08/20/2018  ? Carotid [place]/22: R 1-39; L 100  ? Depression, recurrent ([place]08/25/2020  ? Hypertension   ? Hypothyroidism   ? Impaired functional mobility, balance, gait, and endurance 09/02/2015  ? Malignant  neoplasm of lower-outer quadrant of left breast of female, estrogen receptor positive ([place]) 04/24/2021  ? Mild cognitive impairment with memory loss 09/02/2015  ? Osteopenia   ? Pre-diabetes   ? Stroke [facility])   ? pt was 40  ? ? ?Past Surgical History:  ?Procedure Laterality Date  ? ABDOMINAL HYSTERECTOMY    ? BACK SURGERY    ? 2019  ? BRAIN SURGERY    ? age 19's  ? BREAST LUMPECTOMY WITH RADIOACTIVE SEED AND SENTINEL LYMPH NODE BIOPSY Left 05/17/2021  ? Procedure: LEFT BREAST SEED LOCALIZED LUMPECTOMY (BRACKETED) WITH SENTINEL LYMPH NODE BIOPSY;  Surgeon: Erroll Luna, MD;  Location: [place];  Service: General;  Laterality: Left;  PEC BLOCK ?90 MINUTES ROOM 9  ? RE-EXCISION OF BREAST  LUMPECTOMY Left 06/08/2021  ? Procedure: RE-EXCISION OF LEFT BREAST LUMPECTOMY;  Surgeon: Erroll Luna, MD;  [facility];  Service: General;  Laterality: Left;  ? TOE AMPUTATION  2013  ? 2nd toe on right foot, hammer toe  ? TONSILLECTOMY    ? TOTAL HIP ARTHROPLASTY Left 03/19/2017  ? Procedure: LEFT TOTAL HIP ARTHROPLASTY ANTERIOR APPROACH;  Surgeon: Mcarthur Rossetti, MD;  Location: [place];  Service: Orthopedics;  Laterality: Left;  ? ? ?Current Medications: ?Current Meds  ?Medication Sig  ? amitriptyline (ELAVIL) 50 MG tablet Take 1 tablet (50 mg total) by mouth at bedtime.  ? amLODipine (NORVASC) 5 MG tablet Take 1 tablet (5 mg total) by mouth at bedtime.  ? aspirin 81 MG chewable tablet Chew 1 tablet (81 mg total) by mouth daily.  ? atorvastatin (LIPITOR) 20 MG tablet Take 1 tablet (20 mg total) by mouth daily.  ? benazepril (LOTENSIN) 40 MG tablet Take 1 tablet (40 mg total) by mouth daily.  ? gabapentin (NEURONTIN) 100 MG capsule 1 tablet in the morning and 2 at night  ? levothyroxine (SYNTHROID) 88 MCG tablet Take 1 tablet (88 mcg total) by mouth every morning. 30 minutes before food  ? meloxicam (MOBIC) 7.5 MG tablet Take 1 tablet (7.5 mg total) by mouth daily.  ? Misc Natural Products (OSTEO BI-FLEX ADV TRIPLE ST) TABS Take 1 tablet by mouth daily. 36m of Vitamin C  (Ascorbic Acid); 239mManganese (Manganese Sulfate); 3516modium; 1500m26mucosamine HCI; 100mg21moxin (Boswellia Serrata Extract resin); 1103mg 52mndroitin/MSM Complex (Chrondroitin Sulfate, Methylsulfonylmethane, Collagen (Hydrolyzed Gelatin), Boswellia Serrata (resin); Boron (Bororganic Glycine); Hyaluronic Acid  ? Multiple Vitamins-Minerals (CENTRUM SILVER ADULT 50+) TABS Take 1 tablet by mouth daily.  ? oxymetazoline (AFRIN) 0.05 % nasal spray Place 1 spray into both nostrils 2 (two) times daily as needed for congestion.  ? tamoxifen (NOLVADEX) 20 MG tablet Take 1 tablet (20 mg total) by mouth daily.  ? VITAMIN E/D-ALPHA PO  Take 1 tablet by mouth daily.  ?  ? ?Allergies:   Patient has no known allergies.  ? ?Social History  ? ?Socioeconomic History  ? Marital status: Widowed  ?  Spouse name: Not on file  ? Number of children: 2  ? Years of education: masters  ? Highest education level: Not on file  ?Occupational History  ? Occupation: RetireFirefightermployer: RETIRED  ?Tobacco Use  ? Smoking status: Never  ? Smokeless tobacco: Never  ?Vaping Use  ? Vaping Use: Never used  ?Substance and Sexual Activity  ? Alcohol use: Not Currently  ?  Alcohol/week: 1.0 standard drink  ?  Types: 1 Glasses of wine per week  ? Drug use: No  ? Sexual activity:  Not Currently  ?  Birth control/protection: Post-menopausal  ?Other Topics Concern  ? Not on file  ?Social History Narrative  ? Health Care POA:   ? Emergency Contact: son, Doreene Eland, (c) [phone]  ? End of Life Plan:   ? Who lives with you: lives alone now   ? Any pets: 2 cats, Sadie and Katie  ? Diet: Pt has a varied diet and tries to limit sweets.   ? Exercise: Pt walks on treadmill 10 minutes a day and uses bike 10 minutes a day.  ? Seatbelts: Pt reports wearing seatbelt when in vehicles.   ? Nancy Fetter Exposure/Protection: Pt does not use sun protection.  ? Hobbies: reading, watching movies, writing  ?   ? Husband passed away about 10 years ago   ? ?Social Determinants of Health  ? ?Financial Resource Strain: Low Risk   ? Difficulty of Paying Living Expenses: Not very hard  ?Food Insecurity: Not on file  ?Transportation Needs: No Transportation Needs  ? Lack of Transportation (Medical): No  ? Lack of Transportation (Non-Medical): No  ?Physical Activity: Not on file  ?Stress: No Stress Concern Present  ? Feeling of Stress : Only a little  ?Social Connections: Moderately Integrated  ? Frequency of Communication with Friends and Family: More than three times a week  ? Frequency of Social Gatherings with Friends and Family: Once a week  ? Attends Religious Services: More than 4 times per year  ?  Active Member of Clubs or Organizations: Yes  ? Attends Archivist Meetings: Never  ? Marital Status: Widowed  ?  ? ?Family History: ?The patient's family history includes Heart disease in her father; Terie Purser

## 2021-10-27 NOTE — Patient Instructions (Signed)
Medication Instructions:  ?Your physician recommends that you continue on your current medications as directed. Please refer to the Current Medication list given to you today.  ?*If you need a refill on your cardiac medications before your next appointment, please call your pharmacy* ? ? ?Lab Work: ?NONE ?If you have labs (blood work) drawn today and your tests are completely normal, you will receive your results only by: ?MyChart Message (if you have MyChart) OR ?A paper copy in the mail ?If you have any lab test that is abnormal or we need to change your treatment, we will call you to review the results. ? ? ?Testing/Procedures: ?Your physician has requested that you have an echocardiogram. Echocardiography is a painless test that uses sound waves to create images of your heart. It provides your doctor with information about the size and shape of your heart and how well your heart?s chambers and valves are working. This procedure takes approximately one hour. There are no restrictions for this procedure. TO BE DONE IN 6 MONTHS ? ? ?Follow-Up: ?At [facility], you and your health needs are our priority.  As part of our continuing mission to provide you with exceptional heart care, we have created designated Provider Care Teams.  These Care Teams include your primary Cardiologist (physician) and Advanced Practice Providers (APPs -  Physician Assistants and Nurse Practitioners) who all work together to provide you with the care you need, when you need it. ? ?We recommend signing up for the patient portal called "MyChart".  Sign up information is provided on this After Visit Summary.  MyChart is used to connect with patients for Virtual Visits (Telemedicine).  Patients are able to view lab/test results, encounter notes, upcoming appointments, etc.  Non-urgent messages can be sent to your provider as well.   ?To learn more about what you can do with MyChart, go to [email or web address].   ? ?Your next appointment:    ?6 month(s) ? ?The format for your next appointment:   ?In Person ? ?Provider:   ?Nell Range, PA-C{ ? ? ? ?Important Information About Sugar ? ? ? ? ?  ?

## 2021-11-01 ENCOUNTER — Ambulatory Visit: Payer: Medicare HMO | Admitting: Cardiovascular Disease

## 2021-11-09 ENCOUNTER — Encounter: Payer: Self-pay | Admitting: *Deleted

## 2021-11-14 ENCOUNTER — Other Ambulatory Visit: Payer: Self-pay | Admitting: Family Medicine

## 2021-11-14 ENCOUNTER — Other Ambulatory Visit: Payer: Self-pay | Admitting: Hematology

## 2021-11-16 ENCOUNTER — Ambulatory Visit (INDEPENDENT_AMBULATORY_CARE_PROVIDER_SITE_OTHER): Payer: Medicare HMO | Admitting: Podiatry

## 2021-11-16 ENCOUNTER — Encounter: Payer: Self-pay | Admitting: Podiatry

## 2021-11-16 DIAGNOSIS — E1149 Type 2 diabetes mellitus with other diabetic neurological complication: Secondary | ICD-10-CM

## 2021-11-16 DIAGNOSIS — M2041 Other hammer toe(s) (acquired), right foot: Secondary | ICD-10-CM

## 2021-11-16 DIAGNOSIS — E1140 Type 2 diabetes mellitus with diabetic neuropathy, unspecified: Secondary | ICD-10-CM | POA: Diagnosis not present

## 2021-11-16 DIAGNOSIS — M21619 Bunion of unspecified foot: Secondary | ICD-10-CM | POA: Diagnosis not present

## 2021-11-17 NOTE — Progress Notes (Signed)
Subjective:   Patient ID: Yvonne [name], female   DOB: 81 y.o.   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

## 2021-12-01 ENCOUNTER — Other Ambulatory Visit: Payer: Self-pay

## 2021-12-01 ENCOUNTER — Inpatient Hospital Stay: Payer: Medicare HMO | Attending: Hematology | Admitting: Hematology

## 2021-12-01 ENCOUNTER — Encounter: Payer: Self-pay | Admitting: Hematology

## 2021-12-01 VITALS — BP 130/57 | HR 60 | Temp 97.6°F | Resp 19 | Ht 61.0 in | Wt 164.1 lb

## 2021-12-01 DIAGNOSIS — F32A Depression, unspecified: Secondary | ICD-10-CM | POA: Diagnosis not present

## 2021-12-01 DIAGNOSIS — Z90710 Acquired absence of both cervix and uterus: Secondary | ICD-10-CM | POA: Insufficient documentation

## 2021-12-01 DIAGNOSIS — C50512 Malignant neoplasm of lower-outer quadrant of left female breast: Secondary | ICD-10-CM | POA: Diagnosis not present

## 2021-12-01 DIAGNOSIS — Z79810 Long term (current) use of selective estrogen receptor modulators (SERMs): Secondary | ICD-10-CM | POA: Insufficient documentation

## 2021-12-01 DIAGNOSIS — M8580 Other specified disorders of bone density and structure, unspecified site: Secondary | ICD-10-CM | POA: Diagnosis not present

## 2021-12-01 DIAGNOSIS — Z170 Estrogen receptor positive status [ER+]: Secondary | ICD-10-CM | POA: Diagnosis not present

## 2021-12-01 MED ORDER — VENLAFAXINE HCL ER 37.5 MG PO CP24: 37.5000 mg | ORAL_CAPSULE | Freq: Every day | ORAL | 2 refills | Status: DC

## 2021-12-01 NOTE — Progress Notes (Signed)
[facility]   [phone] Fax:([phone]) [phone]   Clinic Follow up Note   Patient Care Team: Martyn Malay, MD as PCP - General (Family Medicine) Nahser, Wonda Cheng, MD as PCP - Cardiology (Cardiology) Kennith Center, RD as Dietitian (Family Medicine) Mauro Kaufmann, RN as Oncology Nurse Navigator Rockwell Germany, RN as Oncology Nurse Navigator Erroll Luna, MD as Consulting Physician (General Surgery) Truitt Merle, MD as Consulting Physician (Hematology) Eppie Gibson, MD as Attending Physician (Radiation Oncology)  Date of Service:  12/01/2021  CHIEF COMPLAINT: f/u of left breast cancer  CURRENT THERAPY:  Tamoxifen, started 09/01/21  ASSESSMENT & PLAN:  Yvonne [name] is a 81 y.o. post-hysterectomy female with   1. 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 Biopsy on 04/19/21 confirmed invasive and in situ ductal carcinoma. -left lumpectomy on 05/17/21 by Dr. Brantley Stage showed: 5.5 cm IDC with DCIS. Final lateral margin was positive. Other margins and lymph nodes negative. -she received radiation under Dr. Isidore Moos 1/16-2/13/23. -she started tamoxifen on 09/01/21. She is tolerating well with some mild mood swings/depression. I will call in effexor for her to try.  I discussed the benefit and potential side effect, and a mild interaction with amitriptyline.  She agrees to try. -she will be due for mammogram in 03/2022; I ordered for her today. -she is otherwise doing well. Physical exam was unremarkable. There is no clinical concern for recurrence.   2. Bone Health -repeat DEXA on 05/01/21 showed osteopenia (T-score of -2.0). -we discussed tamoxifen can strengthen her bones.     PLAN:  -continue tamoxifen -I called in effexor -lab and f/u in 3 months as scheduled   No problem-specific Assessment & Plan notes found for this encounter.   SUMMARY OF  ONCOLOGIC HISTORY: Oncology History Overview Note  Cancer Staging Malignant neoplasm of lower-outer quadrant of left breast of female, estrogen receptor positive ([place]) Staging form: Breast, AJCC 8th Edition - Clinical stage from 04/25/2021: Stage IIA (cT3, cN0, cM0, G1, ER+, PR+, HER2-) - Signed by Truitt Merle, MD on 04/26/2021    Malignant neoplasm of lower-outer quadrant of left breast of female, estrogen receptor positive ([place])  04/05/2021 Mammogram   Exam: 3D Mammogram Diagnostic Bilateral Digital - Bilateral Breast Ultrasound - Limited Bilateral  IMPRESSION: The irregular mass in the left breast is highly suspicious of malignancy.   04/19/2021 Pathology Results   Diagnosis  Breast, left, needle core biopsy, mass 7x7x4cm BIRADS5  - INVASIVE MAMMARY CARCINOMA  - MAMMARY CARCINOMA IN SITU Microscopic Comment The biopsy material shows an infiltrative proliferation of cells arranged linearly and in small clusters. Based on the biopsy, the carcinoma appears Nottingham grade 1-2 pf 3 and measures 1.6 cm in greatest linear extent.  E-cadherin is POSITIVE supporting a ductal origin  PROGNOSTIC INDICATORS Results: The tumor cells are EQUIVOCAL for Her2 (2+). Her2 by FISH will be performed and results reported separately. Estrogen Receptor: 100%, POSITIVE, STRONG STAINING INTENSITY Progesterone Receptor: 100%, POSITIVE, STRONG STAINING INTENSITY Proliferation Marker Ki67: 5%  FLUORESCENCE IN-SITU HYBRIDIZATION Results: GROUP 5: HER2 **NEGATIVE**   04/24/2021 Initial Diagnosis   Malignant neoplasm of lower-outer quadrant of left breast of female, estrogen receptor positive ([place])   04/25/2021 Cancer Staging   Staging form: Breast, AJCC 8th Edition - Clinical stage from 04/25/2021: Stage IIA (cT3, cN0, cM0, G1, ER+, PR+, HER2-) - Signed by Truitt Merle, MD on 04/26/2021 Stage prefix: Initial  diagnosis Histologic grading system: 3 grade system   05/17/2021 Cancer Staging   Staging form:  Breast, AJCC 8th Edition - Pathologic stage from 05/17/2021: Stage IB (pT3, pN0, cM0, G2, ER+, PR+, HER2-) - Signed by Truitt Merle, MD on 07/29/2021 Stage prefix: Initial diagnosis Multigene prognostic tests performed: None Histologic grading system: 3 grade system Residual tumor (R): R0 - None   05/17/2021 Definitive Surgery   FINAL MICROSCOPIC DIAGNOSIS:   A. 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  - Anterior margin free.  Tumor approaches to less than 0.1 cm of inked margin.  - Medial margin widely free of tumor.  - Posterior margin free.  Tumor approaches to 0.2 cm of inked margin.  - Ductal carcinoma in situ.   B. BREAST, LEFT ADDITIONAL LATERAL MARGIN, EXCISION:  - Invasive ductal carcinoma involving new lateral margin.  - Usual ductal hyperplasia and fibrocystic changes.  - Intraductal papilloma.  - Old fibroadenoma.   C. BREAST, LEFT ADDITIONAL SUPERIOR MARGIN, EXCISION:  - No carcinoma identified.  New superior margin free.  - Lobular neoplasia (atypical lobular hyperplasia).  - Complex sclerosing lesion.  - Fibrocystic changes and usual ductal hyperplasia.   D. BREAST, LEFT ADDITIONAL MEDIAL MARGIN, EXCISION:  - New medial margin free.  Invasive ductal carcinoma approaches to 0.8 cm of inked margin.   E. BREAST, LEFT ADDITIONAL INFERIOR MARGIN, EXCISION:  - Small fragment of tissue suspicious for malignancy, 0.2 cm from inked margin.  - New inferior margin free.   F. BREAST, LEFT ADDITIONAL POSTERIOR MARGIN, EXCISION:  - New posterior margin free.  Invasive ductal carcinoma approaches to 0.2 cm of inked margin.   G. BREAST, LEFT ADDITIONAL ANTERIOR MARGIN, EXCISION:  - No malignancy identified.   H. LYMPH NODE, LEFT AXILLARY, SENTINEL, EXCISION:  - One lymph node, negative for carcinoma (0/1).   I. LYMPH NODE, LEFT AXILLARY, SENTINEL, EXCISION:  - One lymph  node, negative for carcinoma (0/1).   J. LYMPH NODE, LEFT AXILLARY, SENTINEL, EXCISION:  - One lymph node, negative for carcinoma (0/1).   COMMENT:   The final (new) lateral resection margin is positive for carcinoma.  All other final (new) margins are negative.    07/03/2021 - 07/31/2021 Radiation Therapy   Site Technique Total Dose (Gy) Dose per Fx (Gy) Completed Fx Beam Energies  Breast, Left: Breast_L 3D 40.05/40.05 2.67 15/15 6XFFF, 10XFFF  Breast, Left: Breast_L_Bst 3D 10/10 2 5/5 6X, 10X         INTERVAL HISTORY:  AYAANA BIONDO is here for a follow up of breast cancer. She was last seen by me on 09/01/21. She presents to the clinic accompanied by her daughter. She denies breast concerns or pain. She reports some mild bouts of depression since starting tamoxifen, noting she feels "bad" about a third of the time. She reports some nasal congestion and headaches recently.   All other systems were reviewed with the patient and are negative.  MEDICAL HISTORY:  Past Medical History:  Diagnosis Date   Anemia    Anxiety    Aortic stenosis    severe by 2021 echo    Arthritis    Bilateral carotid artery disease ([place]) 02/04/2017   Carotid US 3/22: R 1-39; L 100   Depression, recurrent ([place]) 02/10/2019   Hypertension    Hypothyroidism    Impaired functional mobility, balance, gait, and endurance 09/02/2015   Malignant neoplasm of lower-outer  quadrant of left breast of female, estrogen receptor positive ([place]) 04/24/2021   Mild cognitive impairment with memory loss 09/02/2015   Osteopenia    Pre-diabetes    Stroke ([place])    pt was 40    SURGICAL HISTORY: Past Surgical History:  Procedure Laterality Date   ABDOMINAL HYSTERECTOMY     BACK SURGERY     2019   BRAIN SURGERY     age 23's   BREAST LUMPECTOMY WITH RADIOACTIVE SEED AND SENTINEL LYMPH NODE BIOPSY Left 05/17/2021   Procedure: LEFT BREAST SEED LOCALIZED LUMPECTOMY (BRACKETED) WITH SENTINEL LYMPH NODE BIOPSY;   Surgeon: Erroll Luna, MD;  Location: [facility];  Service: General;  Laterality: Left;  PEC BLOCK 90 MINUTES ROOM 9   RE-EXCISION OF BREAST LUMPECTOMY Left 06/08/2021   Procedure: RE-EXCISION OF LEFT BREAST LUMPECTOMY;  Surgeon: Erroll Luna, MD;  Location: [facility];  Service: General;  Laterality: Left;   TOE AMPUTATION  2013   2nd toe on right foot, hammer toe   TONSILLECTOMY     TOTAL HIP ARTHROPLASTY Left 03/19/2017   Procedure: LEFT TOTAL HIP ARTHROPLASTY ANTERIOR APPROACH;  Surgeon: Mcarthur Rossetti, MD;  Location: [facility];  Service: Orthopedics;  Laterality: Left;    I have reviewed the social history and family history with the patient and they are unchanged from previous note.  ALLERGIES:  has No Known Allergies.  MEDICATIONS:  Current Outpatient Medications  Medication Sig Dispense Refill   venlafaxine XR (EFFEXOR-XR) 37.5 MG 24 hr capsule Take 1 capsule (37.5 mg total) by mouth daily with breakfast. 30 capsule 2   amitriptyline (ELAVIL) 50 MG tablet Take 1 tablet (50 mg total) by mouth at bedtime. 90 tablet 3   amLODipine (NORVASC) 5 MG tablet Take 1 tablet (5 mg total) by mouth at bedtime. 90 tablet 3   aspirin 81 MG chewable tablet Chew 1 tablet (81 mg total) by mouth daily. 90 tablet 3   atorvastatin (LIPITOR) 20 MG tablet Take 1 tablet (20 mg total) by mouth daily. 90 tablet 3   benazepril (LOTENSIN) 40 MG tablet Take 1 tablet (40 mg total) by mouth daily. 90 tablet 3   gabapentin (NEURONTIN) 100 MG capsule 1 tablet in the morning and 2 at night 270 capsule 3   levothyroxine (SYNTHROID) 88 MCG tablet Take 1 tablet (88 mcg total) by mouth every morning. 30 minutes before food 90 tablet 3   meloxicam (MOBIC) 7.5 MG tablet Take 1 tablet (7.5 mg total) by mouth daily. 15 tablet 0   Misc Natural Products (OSTEO BI-FLEX ADV TRIPLE ST) TABS Take 1 tablet by mouth daily. 60mg  of Vitamin C  (Ascorbic Acid); 2mg  Manganese (Manganese Sulfate); 35mg  Sodium; 1500mg  Glucosamine HCI;  100mg  5-Loxin (Boswellia Serrata Extract resin); 1103mg  (Chondroitin/MSM Complex (Chrondroitin Sulfate, Methylsulfonylmethane, Collagen (Hydrolyzed Gelatin), Boswellia Serrata (resin); Boron (Bororganic Glycine); Hyaluronic Acid     Multiple Vitamins-Minerals (CENTRUM SILVER ADULT 50+) TABS Take 1 tablet by mouth daily.     oxymetazoline (AFRIN) 0.05 % nasal spray Place 1 spray into both nostrils 2 (two) times daily as needed for congestion.     tamoxifen (NOLVADEX) 20 MG tablet TAKE 1 TABLET EVERY DAY 90 tablet 0   VITAMIN E/D-ALPHA PO Take 1 tablet by mouth daily.     No current facility-administered medications for this visit.    PHYSICAL EXAMINATION: ECOG PERFORMANCE STATUS: 0 - Asymptomatic  Vitals:   12/01/21 1208  BP: (!) 130/57  Pulse: 60  Resp: 19  Temp: 97.6 F (  36.4 C)  SpO2: 97%   Wt Readings from Last 3 Encounters:  12/01/21 164 lb 1.6 oz (74.4 kg)  10/27/21 164 lb 9.6 oz (74.7 kg)  09/08/21 165 lb 2 oz (74.9 kg)     GENERAL:alert, no distress and comfortable SKIN: skin color, texture, turgor are normal, no rashes or significant lesions EYES: normal, Conjunctiva are pink and non-injected, sclera clear  NECK: supple, thyroid normal size, non-tender, without nodularity LYMPH:  no palpable lymphadenopathy in the cervical, axillary LUNGS: clear to auscultation and percussion with normal breathing effort HEART: regular rate & rhythm and no murmurs and no lower extremity edema ABDOMEN:abdomen soft, non-tender and normal bowel sounds Musculoskeletal:no cyanosis of digits and no clubbing  NEURO: alert & oriented x 3 with fluent speech, no focal motor/sensory deficits BREAST: No palpable mass, nodules or adenopathy bilaterally. Breast exam benign.   LABORATORY DATA:  I have reviewed the data as listed    Latest Ref Rng & Units 05/31/2021    1:33 PM 05/09/2021    2:00 PM 04/26/2021    8:24 AM  CBC  WBC 4.0 - 10.5 K/uL 11.0  11.5  9.7   Hemoglobin 12.0 - 15.0 g/dL  13.5  13.9  13.5   Hematocrit 36.0 - 46.0 % 41.0  42.0  40.3   Platelets 150 - 400 K/uL 359  390  359         Latest Ref Rng & Units 05/31/2021    1:33 PM 05/09/2021    2:00 PM 04/26/2021    8:24 AM  CMP  Glucose 70 - 99 mg/dL 137  172  251   BUN 8 - 23 mg/dL $Remove'13  22  15   'KadygZs$ Creatinine 0.44 - 1.00 mg/dL 0.63  0.72  0.84   Sodium 135 - 145 mmol/L 136  135  136   Potassium 3.5 - 5.1 mmol/L 3.7  4.1  3.8   Chloride 98 - 111 mmol/L 100  102  101   CO2 22 - 32 mmol/L $RemoveB'27  25  23   'cilfqhAT$ Calcium 8.9 - 10.3 mg/dL 9.1  8.9  8.9   Total Protein 6.5 - 8.1 g/dL   7.4   Total Bilirubin 0.3 - 1.2 mg/dL   0.3   Alkaline Phos 38 - 126 U/L   86   AST 15 - 41 U/L   25   ALT 0 - 44 U/L   25       RADIOGRAPHIC STUDIES: I have personally reviewed the radiological images as listed and agreed with the findings in the report. No results found.    Orders Placed This Encounter  Procedures   MM DIAG BREAST TOMO BILATERAL    Standing Status:   Future    Standing Expiration Date:   12/02/2022    Scheduling Instructions:     [facility]    Order Specific Question:   Reason for Exam (SYMPTOM  OR DIAGNOSIS REQUIRED)    Answer:   screening    Order Specific Question:   Preferred imaging location?    Answer:   External   All questions were answered. The patient knows to call the clinic with any problems, questions or concerns. No barriers to learning was detected. The total time spent in the appointment was 30 minutes.     Truitt Merle, MD 12/01/2021   I, Wilburn Mylar, am acting as scribe for Truitt Merle, MD.   I have reviewed the above documentation for accuracy and completeness, and I agree with  the above.

## 2022-01-08 ENCOUNTER — Encounter: Payer: Self-pay | Admitting: Family Medicine

## 2022-01-08 ENCOUNTER — Ambulatory Visit (INDEPENDENT_AMBULATORY_CARE_PROVIDER_SITE_OTHER): Payer: Medicare HMO | Admitting: Family Medicine

## 2022-01-08 VITALS — BP 120/58 | HR 86 | Ht 61.0 in | Wt 164.2 lb

## 2022-01-08 DIAGNOSIS — I10 Essential (primary) hypertension: Secondary | ICD-10-CM | POA: Diagnosis not present

## 2022-01-08 DIAGNOSIS — N181 Chronic kidney disease, stage 1: Secondary | ICD-10-CM

## 2022-01-08 DIAGNOSIS — M8580 Other specified disorders of bone density and structure, unspecified site: Secondary | ICD-10-CM | POA: Diagnosis not present

## 2022-01-08 DIAGNOSIS — I350 Nonrheumatic aortic (valve) stenosis: Secondary | ICD-10-CM | POA: Diagnosis not present

## 2022-01-08 DIAGNOSIS — F339 Major depressive disorder, recurrent, unspecified: Secondary | ICD-10-CM | POA: Diagnosis not present

## 2022-01-08 DIAGNOSIS — E1122 Type 2 diabetes mellitus with diabetic chronic kidney disease: Secondary | ICD-10-CM

## 2022-01-08 LAB — POCT GLYCOSYLATED HEMOGLOBIN (HGB A1C): HbA1c, POC (controlled diabetic range): 6.6 % (ref 0.0–7.0)

## 2022-01-08 NOTE — Assessment & Plan Note (Addendum)
Continues to be very well controlled On a statin and ARB Not on metformin as A1C well controlled with diet  Referred to [facility] for diabetic eye exam

## 2022-01-08 NOTE — Patient Instructions (Addendum)
It was wonderful to see you today.  Please bring ALL of your medications with you to every visit.   Today we talked about:  --Continuing your medications  --We will watch your blood pressure--we may decrease or stop of your pills if your BP is low at the Oncology office  -Your long term marker for diabetes is 6.6%  I do not recommend any medicine at this time for this.    Please follow up in 6 months   Thank you for choosing [facility].   Please call [phone] with any questions about today's appointment.  Please be sure to schedule follow up at the front  desk before you leave today.   Dorris Singh, MD  Family Medicine

## 2022-01-08 NOTE — Progress Notes (Signed)
    SUBJECTIVE:   CHIEF COMPLAINT: diabetes check and follow up changes in medications HPI:   Yvonne [name] is a 81 y.o.  with history notable for moderate to severe AS, breast cancer s/p resection now on tamoxifen, and type 2 DM  presenting for  follow up.   She has no particular concerns today. She is tolerating tamoxifen. She was started on Effexor--has not noticed a difference in her mood. She has follow up scheduled with oncology.  She is taking her medications as prescribed. She has had no falls X3 months. Thinks she might have had one 6 months ago. Still has caregiver with her most days.   In terms of her AS, she was recently seen by Cardiology. Denies chest pain, syncope, dyspnea, LE edema.    PERTINENT  PMH / PSH/Family/Social History : updated and reviewed  OBJECTIVE:   BP (!) 120/58   Pulse 86   Ht '5\' 1"'$  (1.549 m)   Wt 164 lb 3.2 oz (74.5 kg)   SpO2 95%   BMI 31.03 kg/m   Today's weight:  Last Weight  Most recent update: 01/08/2022 11:39 AM    Weight  74.5 kg (164 lb 3.2 oz)            Review of prior weights: Filed Weights   01/08/22 1138  Weight: 164 lb 3.2 oz (74.5 kg)    Cardiac: Regular rate and rhythm. Normal S1/S2. Harsh 3/6 systolic murmur at RUSB with radiation to carotids  Lungs: Clear bilaterally to ascultation.  Abdomen: Normoactive bowel sounds. No tenderness to deep or light palpation. No rebound or guarding.   Psych: Pleasant and appropriate   ASSESSMENT/PLAN:   Essential hypertension At goal and diastolic low---could consider discontinuing HCTZ in future.   Aortic stenosis Still asymptomatic, she is to follow up in 6 months with Cardiology.   Controlled type 2 diabetes mellitus with chronic kidney disease, without long-term current use of insulin ([place]) Continues to be very well controlled On a statin and ARB Not on metformin as A1C well controlled with diet  Referred to [facility] for diabetic eye exam   Depression, recurrent  ([place]) Doing well on Effexor. Discussed side effects.   Osteopenia Repeat DEXA at follow up    Follow up 6 months--monitor BP, order DEXA, TSH at next visit     Dorris Singh, [place]

## 2022-01-08 NOTE — Assessment & Plan Note (Signed)
Still asymptomatic, she is to follow up in 6 months with Cardiology.

## 2022-01-08 NOTE — Assessment & Plan Note (Signed)
Doing well on Effexor. Discussed side effects.

## 2022-01-08 NOTE — Assessment & Plan Note (Signed)
Repeat DEXA at follow up

## 2022-01-08 NOTE — Assessment & Plan Note (Signed)
At goal and diastolic low---could consider discontinuing HCTZ in future.

## 2022-02-01 ENCOUNTER — Telehealth: Payer: Self-pay | Admitting: *Deleted

## 2022-02-02 NOTE — Patient Outreach (Signed)
  Care Coordination   02/02/2022 Name: Yvonne [name] MRN: [id number] DOB: Apr 12, 1941   Care Coordination Outreach Attempts:  An unsuccessful telephone outreach was attempted today to offer the patient information about available care coordination services as a benefit of their health plan.   Follow Up Plan:  Additional outreach attempts will be made to offer the patient care coordination information and services.   Encounter Outcome:  No Answer  Care Coordination Interventions Activated:  No   Care Coordination Interventions:  No, not indicated    Eduard Clos MSW, LCSW Licensed Clinical Social Worker      [phone]

## 2022-02-28 ENCOUNTER — Other Ambulatory Visit: Payer: Self-pay

## 2022-02-28 DIAGNOSIS — C50512 Malignant neoplasm of lower-outer quadrant of left female breast: Secondary | ICD-10-CM

## 2022-03-01 ENCOUNTER — Inpatient Hospital Stay: Payer: Medicare HMO | Attending: Hematology

## 2022-03-01 ENCOUNTER — Other Ambulatory Visit: Payer: Self-pay

## 2022-03-01 ENCOUNTER — Inpatient Hospital Stay (HOSPITAL_BASED_OUTPATIENT_CLINIC_OR_DEPARTMENT_OTHER): Payer: Medicare HMO | Admitting: Hematology

## 2022-03-01 VITALS — BP 131/65 | HR 94 | Temp 97.8°F | Resp 18 | Ht 61.0 in | Wt 164.1 lb

## 2022-03-01 DIAGNOSIS — Z170 Estrogen receptor positive status [ER+]: Secondary | ICD-10-CM | POA: Diagnosis not present

## 2022-03-01 DIAGNOSIS — Z79810 Long term (current) use of selective estrogen receptor modulators (SERMs): Secondary | ICD-10-CM | POA: Diagnosis not present

## 2022-03-01 DIAGNOSIS — Z79899 Other long term (current) drug therapy: Secondary | ICD-10-CM | POA: Diagnosis not present

## 2022-03-01 DIAGNOSIS — Z7982 Long term (current) use of aspirin: Secondary | ICD-10-CM | POA: Diagnosis not present

## 2022-03-01 DIAGNOSIS — Z923 Personal history of irradiation: Secondary | ICD-10-CM | POA: Diagnosis not present

## 2022-03-01 DIAGNOSIS — Z1231 Encounter for screening mammogram for malignant neoplasm of breast: Secondary | ICD-10-CM | POA: Diagnosis not present

## 2022-03-01 DIAGNOSIS — C50512 Malignant neoplasm of lower-outer quadrant of left female breast: Secondary | ICD-10-CM | POA: Insufficient documentation

## 2022-03-01 DIAGNOSIS — M8580 Other specified disorders of bone density and structure, unspecified site: Secondary | ICD-10-CM | POA: Insufficient documentation

## 2022-03-01 LAB — CBC WITH DIFFERENTIAL/PLATELET
Abs Immature Granulocytes: 0.04 10*3/uL (ref 0.00–0.07)
Basophils Absolute: 0.1 10*3/uL (ref 0.0–0.1)
Basophils Relative: 1 %
Eosinophils Absolute: 0.3 10*3/uL (ref 0.0–0.5)
Eosinophils Relative: 3 %
HCT: 40.3 % (ref 36.0–46.0)
Hemoglobin: 13.5 g/dL (ref 12.0–15.0)
Immature Granulocytes: 0 %
Lymphocytes Relative: 17 %
Lymphs Abs: 1.5 10*3/uL (ref 0.7–4.0)
MCH: 30.5 pg (ref 26.0–34.0)
MCHC: 33.5 g/dL (ref 30.0–36.0)
MCV: 91.2 fL (ref 80.0–100.0)
Monocytes Absolute: 0.7 10*3/uL (ref 0.1–1.0)
Monocytes Relative: 8 %
Neutro Abs: 6.6 10*3/uL (ref 1.7–7.7)
Neutrophils Relative %: 71 %
Platelets: 337 10*3/uL (ref 150–400)
RBC: 4.42 MIL/uL (ref 3.87–5.11)
RDW: 13.7 % (ref 11.5–15.5)
WBC: 9.2 10*3/uL (ref 4.0–10.5)
nRBC: 0 % (ref 0.0–0.2)

## 2022-03-01 LAB — COMPREHENSIVE METABOLIC PANEL
ALT: 17 U/L (ref 0–44)
AST: 21 U/L (ref 15–41)
Albumin: 4 g/dL (ref 3.5–5.0)
Alkaline Phosphatase: 54 U/L (ref 38–126)
Anion gap: 10 (ref 5–15)
BUN: 16 mg/dL (ref 8–23)
CO2: 24 mmol/L (ref 22–32)
Calcium: 8.9 mg/dL (ref 8.9–10.3)
Chloride: 102 mmol/L (ref 98–111)
Creatinine, Ser: 0.88 mg/dL (ref 0.44–1.00)
GFR, Estimated: >60 mL/min (ref >60)
Glucose, Bld: 354 mg/dL — ABNORMAL HIGH (ref 70–99)
Potassium: 4.4 mmol/L (ref 3.5–5.1)
Sodium: 136 mmol/L (ref 135–145)
Total Bilirubin: 0.3 mg/dL (ref 0.3–1.2)
Total Protein: 7 g/dL (ref 6.5–8.1)

## 2022-03-01 MED ORDER — VENLAFAXINE HCL ER 37.5 MG PO CP24: 37.5000 mg | ORAL_CAPSULE | Freq: Every day | ORAL | 2 refills | Status: DC

## 2022-03-01 NOTE — Progress Notes (Signed)
[facility]   [phone] [phone]   Clinic Follow up Note   Patient Care Team: Martyn Malay, MD as PCP - General (Family Medicine) Nahser, Wonda Cheng, MD as PCP - Cardiology (Cardiology) Kennith Center, RD as Dietitian (Family Medicine) Mauro Kaufmann, RN as Oncology Nurse Navigator Rockwell Germany, RN as Oncology Nurse Navigator Erroll Luna, MD as Consulting Physician (General Surgery) Truitt Merle, MD as Consulting Physician (Hematology) Eppie Gibson, MD as Attending Physician (Radiation Oncology)  Date of Service:  03/01/2022  CHIEF COMPLAINT: f/u of left breast cancer  CURRENT THERAPY:  Tamoxifen, started 09/01/21  ASSESSMENT & PLAN:  Yvonne [name] is a 81 y.o. post-hysterectomy female with   1. 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 Biopsy on 04/19/21 confirmed invasive and in situ ductal carcinoma. -left lumpectomy on 05/17/21 by Dr. Brantley Stage showed: 5.5 cm IDC with DCIS. Final lateral margin was positive. Other margins and lymph nodes negative. -she received radiation under Dr. Isidore Moos 1/16-2/13/23. -she started tamoxifen on 09/01/21. She is tolerating well with some mild mood swings/depression. I called in effexor in 11/2021, which she feels is helping some. I offered to increase her dose, but I have some concern with effexor's mild interaction with amitriptyline. I recommend continuing low dose effexor unless she stops amitriptyline. -she will be due for mammogram in 03/2022.  -she is clinically doing well. Lab reviewed, her BG is up to 354. Otherwise, WNL. Physical exam shows a new palpable nodule at her previous incision, which she reports she felt a few weeks ago. This is likely just scar tissue, but I recommend mammogram sooner. I will also order [place].    2. Bone Health -repeat DEXA on 05/01/21 showed osteopenia (T-score of  -2.0). -we previously discussed tamoxifen can strengthen her bones.     PLAN:  -continue tamoxifen -MM and [place] to be done soon at [facility] -lab and f/u in 6 months   No problem-specific Assessment & Plan notes found for this encounter.   SUMMARY OF ONCOLOGIC HISTORY: Oncology History Overview Note  Cancer Staging Malignant neoplasm of lower-outer quadrant of left breast of female, estrogen receptor positive ([place]) Staging form: Breast, AJCC 8th Edition - Clinical stage from 04/25/2021: Stage IIA (cT3, cN0, cM0, G1, ER+, PR+, HER2-) - Signed by Truitt Merle, MD on 04/26/2021    Malignant neoplasm of lower-outer quadrant of left breast of female, estrogen receptor positive ([place])  04/05/2021 Mammogram   Exam: 3D Mammogram Diagnostic Bilateral Digital - Bilateral Breast Ultrasound - Limited Bilateral  IMPRESSION: The irregular mass in the left breast is highly suspicious of malignancy.   04/19/2021 Pathology Results   Diagnosis  Breast, left, needle core biopsy, mass 7x7x4cm BIRADS5  - INVASIVE MAMMARY CARCINOMA  - MAMMARY CARCINOMA IN SITU Microscopic Comment The biopsy material shows an infiltrative proliferation of cells arranged linearly and in small clusters. Based on the biopsy, the carcinoma appears Nottingham grade 1-2 pf 3 and measures 1.6 cm in greatest linear extent.  E-cadherin is POSITIVE supporting a ductal origin  PROGNOSTIC INDICATORS Results: The tumor cells are EQUIVOCAL for Her2 (2+). Her2 by FISH will be performed and results reported separately. Estrogen Receptor: 100%, POSITIVE, STRONG STAINING INTENSITY Progesterone Receptor: 100%, POSITIVE, STRONG STAINING INTENSITY Proliferation Marker Ki67: 5%  FLUORESCENCE IN-SITU HYBRIDIZATION Results: GROUP 5: HER2 **NEGATIVE**   04/24/2021 Initial Diagnosis   Malignant neoplasm of lower-outer quadrant of  left breast of female, estrogen receptor positive ([place])   04/25/2021 Cancer Staging   Staging form: Breast, AJCC  8th Edition - Clinical stage from 04/25/2021: Stage IIA (cT3, cN0, cM0, G1, ER+, PR+, HER2-) - Signed by Truitt Merle, MD on 04/26/2021 Stage prefix: Initial diagnosis Histologic grading system: 3 grade system   05/17/2021 Cancer Staging   Staging form: Breast, AJCC 8th Edition - Pathologic stage from 05/17/2021: Stage IB (pT3, pN0, cM0, G2, ER+, PR+, HER2-) - Signed by Truitt Merle, MD on 07/29/2021 Stage prefix: Initial diagnosis Multigene prognostic tests performed: None Histologic grading system: 3 grade system Residual tumor (R): R0 - None   05/17/2021 Definitive Surgery   FINAL MICROSCOPIC DIAGNOSIS:   A. 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  - Anterior margin free.  Tumor approaches to less than 0.1 cm of inked margin.  - Medial margin widely free of tumor.  - Posterior margin free.  Tumor approaches to 0.2 cm of inked margin.  - Ductal carcinoma in situ.   B. BREAST, LEFT ADDITIONAL LATERAL MARGIN, EXCISION:  - Invasive ductal carcinoma involving new lateral margin.  - Usual ductal hyperplasia and fibrocystic changes.  - Intraductal papilloma.  - Old fibroadenoma.   C. BREAST, LEFT ADDITIONAL SUPERIOR MARGIN, EXCISION:  - No carcinoma identified.  New superior margin free.  - Lobular neoplasia (atypical lobular hyperplasia).  - Complex sclerosing lesion.  - Fibrocystic changes and usual ductal hyperplasia.   D. BREAST, LEFT ADDITIONAL MEDIAL MARGIN, EXCISION:  - New medial margin free.  Invasive ductal carcinoma approaches to 0.8 cm of inked margin.   E. BREAST, LEFT ADDITIONAL INFERIOR MARGIN, EXCISION:  - Small fragment of tissue suspicious for malignancy, 0.2 cm from inked margin.  - New inferior margin free.   F. BREAST, LEFT ADDITIONAL POSTERIOR MARGIN, EXCISION:  - New posterior margin free.  Invasive ductal carcinoma approaches to 0.2 cm of inked margin.   G.  BREAST, LEFT ADDITIONAL ANTERIOR MARGIN, EXCISION:  - No malignancy identified.   H. LYMPH NODE, LEFT AXILLARY, SENTINEL, EXCISION:  - One lymph node, negative for carcinoma (0/1).   I. LYMPH NODE, LEFT AXILLARY, SENTINEL, EXCISION:  - One lymph node, negative for carcinoma (0/1).   J. LYMPH NODE, LEFT AXILLARY, SENTINEL, EXCISION:  - One lymph node, negative for carcinoma (0/1).   COMMENT:   The final (new) lateral resection margin is positive for carcinoma.  All other final (new) margins are negative.    07/03/2021 - 07/31/2021 Radiation Therapy   Site Technique Total Dose (Gy) Dose per Fx (Gy) Completed Fx Beam Energies  Breast, Left: Breast_L 3D 40.05/40.05 2.67 15/15 6XFFF, 10XFFF  Breast, Left: Breast_L_Bst 3D 10/10 2 5/5 6X, 10X         INTERVAL HISTORY:  Yvonne [name] is here for a follow up of breast cancer. She was last seen by me on 12/01/21. She presents to the clinic accompanied by her daughter/caregiver. She reports she is doing well overall, no new concerns.   All other systems were reviewed with the patient and are negative.  MEDICAL HISTORY:  Past Medical History:  Diagnosis Date   Anemia    Anxiety    Aortic stenosis    severe by 2021 echo    Arthritis    Bilateral carotid artery disease ([place]) 02/04/2017   Carotid US 3/22: R 1-39; L 100   Depression, recurrent ([place]) 02/10/2019  Hypertension    Hypothyroidism    Impaired functional mobility, balance, gait, and endurance 09/02/2015   Malignant neoplasm of lower-outer quadrant of left breast of female, estrogen receptor positive ([place]) 04/24/2021   Mild cognitive impairment with memory loss 09/02/2015   Osteopenia    Pre-diabetes    Stroke ([place])    pt was 40    SURGICAL HISTORY: Past Surgical History:  Procedure Laterality Date   ABDOMINAL HYSTERECTOMY     BACK SURGERY     2019   BRAIN SURGERY     age 33's   BREAST LUMPECTOMY WITH RADIOACTIVE SEED AND SENTINEL LYMPH NODE BIOPSY Left  05/17/2021   Procedure: LEFT BREAST SEED LOCALIZED LUMPECTOMY (BRACKETED) WITH SENTINEL LYMPH NODE BIOPSY;  Surgeon: Erroll Luna, MD;  Location: [facility];  Service: General;  Laterality: Left;  PEC BLOCK 90 MINUTES ROOM 9   RE-EXCISION OF BREAST LUMPECTOMY Left 06/08/2021   Procedure: RE-EXCISION OF LEFT BREAST LUMPECTOMY;  Surgeon: Erroll Luna, MD;  Location: [facility];  Service: General;  Laterality: Left;   TOE AMPUTATION  2013   2nd toe on right foot, hammer toe   TONSILLECTOMY     TOTAL HIP ARTHROPLASTY Left 03/19/2017   Procedure: LEFT TOTAL HIP ARTHROPLASTY ANTERIOR APPROACH;  Surgeon: Mcarthur Rossetti, MD;  Location: [facility];  Service: Orthopedics;  Laterality: Left;    I have reviewed the social history and family history with the patient and they are unchanged from previous note.  ALLERGIES:  has No Known Allergies.  MEDICATIONS:  Current Outpatient Medications  Medication Sig Dispense Refill   amitriptyline (ELAVIL) 50 MG tablet Take 1 tablet (50 mg total) by mouth at bedtime. 90 tablet 3   amLODipine (NORVASC) 5 MG tablet Take 1 tablet (5 mg total) by mouth at bedtime. 90 tablet 3   aspirin 81 MG chewable tablet Chew 1 tablet (81 mg total) by mouth daily. 90 tablet 3   atorvastatin (LIPITOR) 20 MG tablet Take 1 tablet (20 mg total) by mouth daily. 90 tablet 3   benazepril (LOTENSIN) 40 MG tablet Take 1 tablet (40 mg total) by mouth daily. 90 tablet 3   gabapentin (NEURONTIN) 100 MG capsule 1 tablet in the morning and 2 at night 270 capsule 3   hydrochlorothiazide (MICROZIDE) 12.5 MG capsule Take 12.5 mg by mouth daily.     levothyroxine (SYNTHROID) 88 MCG tablet Take 1 tablet (88 mcg total) by mouth every morning. 30 minutes before food 90 tablet 3   Misc Natural Products (OSTEO BI-FLEX ADV TRIPLE ST) TABS Take 1 tablet by mouth daily. 60mg  of Vitamin C  (Ascorbic Acid); 2mg  Manganese (Manganese Sulfate); 35mg  Sodium; 1500mg  Glucosamine HCI; 100mg  5-Loxin (Boswellia  Serrata Extract resin); 1103mg  (Chondroitin/MSM Complex (Chrondroitin Sulfate, Methylsulfonylmethane, Collagen (Hydrolyzed Gelatin), Boswellia Serrata (resin); Boron (Bororganic Glycine); Hyaluronic Acid     Multiple Vitamins-Minerals (CENTRUM SILVER ADULT 50+) TABS Take 1 tablet by mouth daily.     oxymetazoline (AFRIN) 0.05 % nasal spray Place 1 spray into both nostrils 2 (two) times daily as needed for congestion.     tamoxifen (NOLVADEX) 20 MG tablet TAKE 1 TABLET EVERY DAY 90 tablet 0   venlafaxine XR (EFFEXOR-XR) 37.5 MG 24 hr capsule Take 1 capsule (37.5 mg total) by mouth daily with breakfast. 30 capsule 2   VITAMIN E/D-ALPHA PO Take 1 tablet by mouth daily.     No current facility-administered medications for this visit.    PHYSICAL EXAMINATION: ECOG PERFORMANCE STATUS: {CHL ONC ECOG UX:[id number]}  There were no vitals filed for this visit. Wt Readings from Last 3 Encounters:  01/08/22 164 lb 3.2 oz (74.5 kg)  12/01/21 164 lb 1.6 oz (74.4 kg)  10/27/21 164 lb 9.6 oz (74.7 kg)     GENERAL:alert, no distress and comfortable SKIN: skin color, texture, turgor are normal, no rashes or significant lesions EYES: normal, Conjunctiva are pink and non-injected, sclera clear  NECK: supple, thyroid normal size, non-tender, without nodularity LYMPH:  no palpable lymphadenopathy in the cervical, axillary LUNGS: clear to auscultation and percussion with normal breathing effort HEART: regular rate & rhythm and no murmurs and no lower extremity edema ABDOMEN:abdomen soft, non-tender and normal bowel sounds Musculoskeletal:no cyanosis of digits and no clubbing  NEURO: alert & oriented x 3 with fluent speech, no focal motor/sensory deficits BREAST: (+) 3 cm palpable scar tissue at inferior left breast, 6 o'clock.   LABORATORY DATA:  I have reviewed the data as listed    Latest Ref Rng & Units 05/31/2021    1:33 PM 05/09/2021    2:00 PM 04/26/2021    8:24 AM  CBC  WBC 4.0 - 10.5 K/uL  11.0  11.5  9.7   Hemoglobin 12.0 - 15.0 g/dL 13.5  13.9  13.5   Hematocrit 36.0 - 46.0 % 41.0  42.0  40.3   Platelets 150 - 400 K/uL 359  390  359         Latest Ref Rng & Units 05/31/2021    1:33 PM 05/09/2021    2:00 PM 04/26/2021    8:24 AM  CMP  Glucose 70 - 99 mg/dL 137  172  251   BUN 8 - 23 mg/dL $Remove'13  22  15   'KmpZKTz$ Creatinine 0.44 - 1.00 mg/dL 0.63  0.72  0.84   Sodium 135 - 145 mmol/L 136  135  136   Potassium 3.5 - 5.1 mmol/L 3.7  4.1  3.8   Chloride 98 - 111 mmol/L 100  102  101   CO2 22 - 32 mmol/L $RemoveB'27  25  23   'XGepqYco$ Calcium 8.9 - 10.3 mg/dL 9.1  8.9  8.9   Total Protein 6.5 - 8.1 g/dL   7.4   Total Bilirubin 0.3 - 1.2 mg/dL   0.3   Alkaline Phos 38 - 126 U/L   86   AST 15 - 41 U/L   25   ALT 0 - 44 U/L   25       RADIOGRAPHIC STUDIES: I have personally reviewed the radiological images as listed and agreed with the findings in the report. No results found.    No orders of the defined types were placed in this encounter.  All questions were answered. The patient knows to call the clinic with any problems, questions or concerns. No barriers to learning was detected. The total time spent in the appointment was {CHL ONC TIME VISIT - WJXBJ:[id number]}.     Aurea Graff 03/01/2022   I, Wilburn Mylar, am acting as scribe for Truitt Merle, MD.   {Add scribe attestation statement}

## 2022-03-04 ENCOUNTER — Encounter: Payer: Self-pay | Admitting: Hematology

## 2022-03-14 ENCOUNTER — Encounter: Payer: Self-pay | Admitting: Hematology

## 2022-03-14 DIAGNOSIS — Z853 Personal history of malignant neoplasm of breast: Secondary | ICD-10-CM | POA: Diagnosis not present

## 2022-03-15 DIAGNOSIS — H43813 Vitreous degeneration, bilateral: Secondary | ICD-10-CM | POA: Diagnosis not present

## 2022-03-15 DIAGNOSIS — R7309 Other abnormal glucose: Secondary | ICD-10-CM | POA: Diagnosis not present

## 2022-03-15 DIAGNOSIS — Z961 Presence of intraocular lens: Secondary | ICD-10-CM | POA: Diagnosis not present

## 2022-03-15 DIAGNOSIS — H04123 Dry eye syndrome of bilateral lacrimal glands: Secondary | ICD-10-CM | POA: Diagnosis not present

## 2022-03-15 DIAGNOSIS — H26491 Other secondary cataract, right eye: Secondary | ICD-10-CM | POA: Diagnosis not present

## 2022-03-15 DIAGNOSIS — Z9889 Other specified postprocedural states: Secondary | ICD-10-CM | POA: Diagnosis not present

## 2022-03-15 DIAGNOSIS — H5010 Unspecified exotropia: Secondary | ICD-10-CM | POA: Diagnosis not present

## 2022-03-21 ENCOUNTER — Encounter: Payer: Self-pay | Admitting: Family Medicine

## 2022-03-21 LAB — HM DIABETES EYE EXAM

## 2022-04-05 ENCOUNTER — Telehealth: Payer: Self-pay | Admitting: Family Medicine

## 2022-04-05 DIAGNOSIS — F339 Major depressive disorder, recurrent, unspecified: Secondary | ICD-10-CM

## 2022-04-05 MED ORDER — AMITRIPTYLINE HCL 50 MG PO TABS: 50.0000 mg | ORAL_TABLET | Freq: Every day | ORAL | 3 refills | Status: DC

## 2022-04-05 NOTE — Telephone Encounter (Signed)
Refill sent to pharmacy. Please call patient and let her know.  Dorris Singh, MD  Family Medicine Teaching Service

## 2022-04-05 NOTE — Telephone Encounter (Signed)
Patient came in stating that she needs a refill on her amitriptyline, she is almost out

## 2022-04-06 NOTE — Telephone Encounter (Signed)
LVM for patient.  Yvonne [name], [place]

## 2022-04-09 ENCOUNTER — Encounter: Payer: Self-pay | Admitting: Cardiovascular Disease

## 2022-04-09 NOTE — Progress Notes (Signed)
Yvonne [name] Date of Birth  1941/02/16 [place] [address]    [place]   [place], [place]  [address]    [place], [place]  [address] [phone]  Fax  [phone]  [phone]  Fax [phone]   Problem List: 1. Mild AS / AI  2. Hypothyroidism 3. Hypertension 4. Toe amputation for hammer toe 5. History of stroke- occluded left carotid, mild right carotid disease 6. CVA - age 81      Pt is a 81 y.o. female with a hx of stroke at age 40.  She continues to have problems with her right arm.  She has had a heart murmur for years.  She has not had any recent problems.  She has a hx of HTN.  She admits to eating extra salt on occasion.  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 This corresponds with her previous stroke. There was no significant disease on right carotid. An echocardiogram revealed normal left ventricular systolic function.  She has mild aortic stenosis and aortic insufficiency which explains her heart murmur.  She eats lots of salty meals.  She eats prepared lunch and dinners every day.  She eats popcorn every night.    December 08, 2014:    Yvonne [name] is seen back after a 3 year absence.  Still eats some salt .  She is doing well.  She fell the other day.   misstepped on the cement.    December 12, 2015:  Doing well. Is now walking with a walker  Doing well from a cardiac standpoint .  Aug. 20, 2018  Yvonne [name] is seen today  Is walking with a walker for the past year BP looks good today    Aug. 26, 2019:  Yvonne [name] is seen today  Doing well Hx of a stroke at age 67.  Has HTN,  Still eats some some salt.   Jan. 13, 2023 Yvonne [name] is seen today for follow up of her aortic stenosis,  HTN,    Has mod. Severe AS  Has seen McAlhany -  is waiting for her to develop symptoms before proceeding with TAVR  Was supposed to be seen on Jan. 11 - she cancelled.  ( Was  supposed to see McAlhany today from what I can tell )    Has some palpitations at night   Just diagnosed with left  breast cancer  Is doing XRT   Oct. 24, 2023 Yvonne [name] is seen for follow up of her AS  Has seen Dr. Elder Love in the TAVR clinic  Is scheduled for TAVR clinic in 2-3 weeks  Is not feeling well today  [place] twice in the last 10 days . Lots of bruising  Breathing is ok  Is seeing TAVR clinic every 6 months    Current Outpatient Medications on File Prior to Visit  Medication Sig Dispense Refill   amLODipine (NORVASC) 5 MG tablet Take 1 tablet (5 mg total) by mouth at bedtime. 90 tablet 3   aspirin 81 MG chewable tablet Chew 1 tablet (81 mg total) by mouth daily. 90 tablet 3   atorvastatin (LIPITOR) 20 MG tablet Take 1 tablet (20 mg total) by mouth daily. 90 tablet 3   benazepril (LOTENSIN) 40 MG tablet Take 1 tablet (40 mg total) by mouth daily. 90 tablet 3   gabapentin (NEURONTIN) 100 MG capsule 1 tablet in  the morning and 2 at night 270 capsule 3   hydrochlorothiazide (MICROZIDE) 12.5 MG capsule Take 12.5 mg by mouth daily.     levothyroxine (SYNTHROID) 88 MCG tablet Take 1 tablet (88 mcg total) by mouth every morning. 30 minutes before food 90 tablet 3   Misc Natural Products (OSTEO BI-FLEX ADV TRIPLE ST) TABS Take 1 tablet by mouth daily. '60mg'$  of Vitamin C  (Ascorbic Acid); '2mg'$  Manganese (Manganese Sulfate); '35mg'$  Sodium; '1500mg'$  Glucosamine HCI; '100mg'$  5-Loxin (Boswellia Serrata Extract resin); '1103mg'$  (Chondroitin/MSM Complex (Chrondroitin Sulfate, Methylsulfonylmethane, Collagen (Hydrolyzed Gelatin), Boswellia Serrata (resin); Boron (Bororganic Glycine); Hyaluronic Acid     Multiple Vitamins-Minerals (CENTRUM SILVER ADULT 50+) TABS Take 1 tablet by mouth daily.     oxymetazoline (AFRIN) 0.05 % nasal spray Place 1 spray into both nostrils 2 (two) times daily as needed for congestion.     tamoxifen (NOLVADEX) 20 MG tablet TAKE 1 TABLET EVERY DAY 90 tablet 0   venlafaxine XR  (EFFEXOR-XR) 37.5 MG 24 hr capsule Take 1 capsule (37.5 mg total) by mouth daily with breakfast. 30 capsule 2   VITAMIN E/D-ALPHA PO Take 1 tablet by mouth daily.     amitriptyline (ELAVIL) 50 MG tablet Take 1 tablet (50 mg total) by mouth at bedtime. (Patient not taking: Reported on 04/10/2022) 90 tablet 3   No current facility-administered medications on file prior to visit.    No Known Allergies  Past Medical History:  Diagnosis Date   Anemia    Anxiety    Aortic stenosis    severe by 2021 echo    Arthritis    Bilateral carotid artery disease ([place]) 02/04/2017   Carotid US 3/22: R 1-39; L 100   Depression, recurrent ([facility]) 02/10/2019   Hypertension    Hypothyroidism    Impaired functional mobility, balance, gait, and endurance 09/02/2015   Malignant neoplasm of lower-outer quadrant of left breast of female, estrogen receptor positive ([place]) 04/24/2021   Mild cognitive impairment with memory loss 09/02/2015   Osteopenia    Pre-diabetes    Stroke ([facility])    pt was 40    Past Surgical History:  Procedure Laterality Date   ABDOMINAL HYSTERECTOMY     BACK SURGERY     2019   BRAIN SURGERY     age 52's   BREAST LUMPECTOMY WITH RADIOACTIVE SEED AND SENTINEL LYMPH NODE BIOPSY Left 05/17/2021   Procedure: LEFT BREAST SEED LOCALIZED LUMPECTOMY (BRACKETED) WITH SENTINEL LYMPH NODE BIOPSY;  Surgeon: Erroll Luna, MD;  Location: [facility];  Service: General;  Laterality: Left;  PEC BLOCK 90 MINUTES ROOM 9   RE-EXCISION OF BREAST LUMPECTOMY Left 06/08/2021   Procedure: RE-EXCISION OF LEFT BREAST LUMPECTOMY;  Surgeon: Erroll Luna, MD;  [facility];  Service: General;  Laterality: Left;   TOE AMPUTATION  2013   2nd toe on right foot, hammer toe   TONSILLECTOMY     TOTAL HIP ARTHROPLASTY Left 03/19/2017   Procedure: LEFT TOTAL HIP ARTHROPLASTY ANTERIOR APPROACH;  Surgeon: Mcarthur Rossetti, MD;  Location: [facility];  Service: Orthopedics;  Laterality: Left;    Social History    Tobacco Use  Smoking Status Never  Smokeless Tobacco Never    Social History   Substance and Sexual Activity  Alcohol Use Not Currently   Alcohol/week: 1.0 standard drink of alcohol   Types: 1 Glasses of wine per week    Family History  Problem Relation Age of Onset   Thyroid disease Mother    Heart disease Father  Reviw of Systems:  Noted is current hx    Physical Exam: Blood pressure 132/80, pulse 84, height '5\' 1"'$  (1.549 m), weight 163 lb (73.9 kg), SpO2 92 %.     GEN:  elderly female,   HEENT: Normal NECK: No JVD; No carotid bruits LYMPHATICS: No lymphadenopathy CARDIAC: RRR 3/6 systolic murmur  RESPIRATORY:  Clear to auscultation without rales, wheezing or rhonchi  ABDOMEN: Soft, non-tender, non-distended MUSCULOSKELETAL:  No edema; No deformity  SKIN: Warm and dry NEUROLOGIC:  Alert and oriented x 3   ECG: April 10, 2022: Normal sinus rhythm at 84.  Incomplete right bundle branch block.  No changes from previous EKG .    Assessment / Plan:   1. Aortic stenosis :    Moderate - severe AS ,   Has been followed by the TAVR clinic  She was a bit upset about coming to this appt today and having a TAVR appt in 2-3 weeks. Since she wil be following with the TAVR clinic for then next year or so,  I will see her on an as needed basis  - and will anticipate picking her up again after her TAVR   2. Hypothyroidism -    3. Hypertension - BP s well controlled.    4. Toe amputation for hammer toe   5. History of stroke- at age 62     Mertie Moores, MD  04/10/2022 12:39 [address] [place],  [place], [place]  [address] Pager [phone] Phone: [phone]; Fax: [phone]

## 2022-04-10 ENCOUNTER — Ambulatory Visit: Payer: Medicare HMO | Attending: Cardiovascular Disease | Admitting: Cardiovascular Disease

## 2022-04-10 VITALS — BP 132/80 | HR 84 | Ht 61.0 in | Wt 163.0 lb

## 2022-04-10 DIAGNOSIS — I350 Nonrheumatic aortic (valve) stenosis: Secondary | ICD-10-CM

## 2022-04-10 NOTE — Patient Instructions (Signed)
Medication Instructions:  Your physician recommends that you continue on your current medications as directed. Please refer to the Current Medication list given to you today.  *If you need a refill on your cardiac medications before your next appointment, please call your pharmacy*   Lab Work: NONE If you have labs (blood work) drawn today and your tests are completely normal, you will receive your results only by: [place] (if you have MyChart) OR A paper copy in the mail If you have any lab test that is abnormal or we need to change your treatment, we will call you to review the results.  Testing/Procedures: NONE  Follow-Up: At [facility], you and your health needs are our priority.  As part of our continuing mission to provide you with exceptional heart care, we have created designated Provider Care Teams.  These Care Teams include your primary Cardiologist (physician) and Advanced Practice Providers (APPs -  Physician Assistants and Nurse Practitioners) who all work together to provide you with the care you need, when you need it.  Your next appointment:   05/04/22 '@12'$ :30pm  The format for your next appointment:   In Person  Provider:   Structural Heart team    Important Information About Sugar

## 2022-04-19 ENCOUNTER — Other Ambulatory Visit: Payer: Self-pay | Admitting: Hematology

## 2022-04-23 ENCOUNTER — Other Ambulatory Visit: Payer: Self-pay | Admitting: Hematology

## 2022-04-30 ENCOUNTER — Encounter: Payer: Self-pay | Admitting: Family Medicine

## 2022-04-30 ENCOUNTER — Ambulatory Visit (INDEPENDENT_AMBULATORY_CARE_PROVIDER_SITE_OTHER): Payer: Medicare HMO | Admitting: Family Medicine

## 2022-04-30 VITALS — BP 135/74 | HR 88 | Wt 164.2 lb

## 2022-04-30 DIAGNOSIS — E038 Other specified hypothyroidism: Secondary | ICD-10-CM

## 2022-04-30 DIAGNOSIS — Z23 Encounter for immunization: Secondary | ICD-10-CM

## 2022-04-30 DIAGNOSIS — M8580 Other specified disorders of bone density and structure, unspecified site: Secondary | ICD-10-CM | POA: Diagnosis not present

## 2022-04-30 DIAGNOSIS — N181 Chronic kidney disease, stage 1: Secondary | ICD-10-CM

## 2022-04-30 DIAGNOSIS — R296 Repeated falls: Secondary | ICD-10-CM

## 2022-04-30 DIAGNOSIS — I10 Essential (primary) hypertension: Secondary | ICD-10-CM | POA: Diagnosis not present

## 2022-04-30 DIAGNOSIS — F339 Major depressive disorder, recurrent, unspecified: Secondary | ICD-10-CM

## 2022-04-30 DIAGNOSIS — I350 Nonrheumatic aortic (valve) stenosis: Secondary | ICD-10-CM | POA: Diagnosis not present

## 2022-04-30 DIAGNOSIS — E1122 Type 2 diabetes mellitus with diabetic chronic kidney disease: Secondary | ICD-10-CM | POA: Diagnosis not present

## 2022-04-30 DIAGNOSIS — H903 Sensorineural hearing loss, bilateral: Secondary | ICD-10-CM

## 2022-04-30 DIAGNOSIS — Z8673 Personal history of transient ischemic attack (TIA), and cerebral infarction without residual deficits: Secondary | ICD-10-CM

## 2022-04-30 LAB — POCT GLYCOSYLATED HEMOGLOBIN (HGB A1C): HbA1c, POC (controlled diabetic range): 6.8 % (ref 0.0–7.0)

## 2022-04-30 MED ORDER — HYDROCHLOROTHIAZIDE 12.5 MG PO CAPS: 12.5000 mg | ORAL_CAPSULE | Freq: Every day | ORAL | 3 refills | Status: DC

## 2022-04-30 MED ORDER — AMLODIPINE BESYLATE 5 MG PO TABS: 5.0000 mg | ORAL_TABLET | Freq: Every day | ORAL | 3 refills | Status: DC

## 2022-04-30 MED ORDER — LEVOTHYROXINE SODIUM 88 MCG PO TABS: 88.0000 ug | ORAL_TABLET | ORAL | 3 refills | Status: DC

## 2022-04-30 NOTE — Assessment & Plan Note (Addendum)
Continues to be diet controlled She is on a statin and ACE Would avoid SGLT2 given urinary frequency (already a bother at night)

## 2022-04-30 NOTE — Assessment & Plan Note (Signed)
Worsening recently, in part due to falls.  Discussed options Patient would like to increase amitriptyline--will continue at 50 mg given risk of falls Referred to SW for counseling and resources

## 2022-04-30 NOTE — Progress Notes (Signed)
    SUBJECTIVE:   CHIEF COMPLAINT: depression and falls  HPI:   Yvonne [name] is a 81 y.o.  with history notable for very well controlled type 2 diabetes, breast cancer s/p lumpectomy now on Tamoxifen, HTN, and hearing impairment presenting for follow up.  She is joined by her caregiver, [name].  Ms. Ponciano has fallen twice trying to go to church since last visit. Once occurred while walking on wet grass. The other was >2 months ago. Last fall >2 weeks ago. No associated syncope, CP, dizziness, palpitations. She has not gone back to church yet. Last fall occurred on wet grass and she landed on her knee and R ankle. Bruising has improved. Gait is back to normal. Has four point cane and a rollator at home. She does not use either.   Associated with her falls, the patient reports depression. She has become more fearful of leaving the house. Leaves home 1 time per week with caregiver (Thursdays). Endorses low mood, sadness, increased appetite, poor sleep. She is on Effexor and amitriptyline.   Ms. Perea reports compliance with her other medications.  The patient has diet controlled type 2 DM. Weight is stable. No signs or symptoms of diabetes.    PERTINENT  PMH / PSH/Family/Social History : severe AS, hypothyroidism, depression, insomnia, very well controlled type 2 DM   OBJECTIVE:   BP 135/74   Pulse 88   Wt 164 lb 3.2 oz (74.5 kg)   SpO2 93%   BMI 31.03 kg/m   Today's weight:  Last Weight  Most recent update: 04/30/2022 11:18 AM    Weight  74.5 kg (164 lb 3.2 oz)            Review of prior weights: Filed Weights   04/30/22 1118  Weight: 164 lb 3.2 oz (74.5 kg)    Cardiac: Regular rate and rhythm. Harsh 3/6 systolic murmur at RUSB.  Lungs: Clear bilaterally to ascultation.  Psych: Pleasant and appropriate   MSK Bilateral LE examined No bruising No TTP  Good ROM of knees Gait is shuffling but stable   ASSESSMENT/PLAN:   Depression, recurrent (HCC) Worsening  recently, in part due to falls.  Discussed options Patient would like to increase amitriptyline--will continue at 50 mg given risk of falls Referred to SW for counseling and resources   Osteopenia Repeat DEXA ordered  Already on tamoxifen   Controlled type 2 diabetes mellitus with chronic kidney disease, without long-term current use of insulin (HCC) Continues to be diet controlled She is on a statin and ACE Would avoid SGLT2 given urinary frequency (already a bother at night)   Hypothyroidism TSH today   Aortic stenosis Has echocardiogram and TAVR visit pending  Euvolemic today, no chest pain or symptoms on my exam    Frequent falls Suspect due to hearing impairment, vision impairment, OA Discussed using rollator Referral to [facility] PT   HCM Flu given today DEXA ordered--osteopenia last year      Dorris Singh, MD  [place]

## 2022-04-30 NOTE — Assessment & Plan Note (Signed)
Repeat DEXA ordered  Already on tamoxifen

## 2022-04-30 NOTE — Assessment & Plan Note (Signed)
TSH today 

## 2022-04-30 NOTE — Assessment & Plan Note (Signed)
Has echocardiogram and TAVR visit pending  Euvolemic today, no chest pain or symptoms on my exam

## 2022-04-30 NOTE — Patient Instructions (Signed)
It was wonderful to see you today.  Please bring ALL of your medications with you to every visit.   Today we talked about:   I will call you with blood work   We discussed:  - Connecting with a counselor You will be called by our social worker  Please use your walker to go places You will be called by physical therapy  I recommend you undergo a DEXA (bone density.   You can call to schedule an appointment by calling [phone].   Directions [place], [place] [address]  Please let me know if you have questions. I will send you a letter or call you with results.    Please follow up in 4 months   Thank you for choosing [facility].   Please call [phone] with any questions about today's appointment.  Please be sure to schedule follow up at the front  desk before you leave today.   Dorris Singh, MD  Family Medicine

## 2022-05-01 LAB — LIPID PANEL
Chol/HDL Ratio: 2.8 ratio (ref 0.0–4.4)
Cholesterol, Total: 125 mg/dL (ref 100–199)
HDL: 44 mg/dL (ref >39)
LDL Chol Calc (NIH): 55 mg/dL (ref 0–99)
Triglycerides: 154 mg/dL — ABNORMAL HIGH (ref 0–149)
VLDL Cholesterol Cal: 26 mg/dL (ref 5–40)

## 2022-05-01 LAB — MICROALBUMIN / CREATININE URINE RATIO
Creatinine, Urine: 229.5 mg/dL
Microalb/Creat Ratio: 32 mg/g creat — ABNORMAL HIGH (ref 0–29)
Microalbumin, Urine: 73.7 ug/mL

## 2022-05-01 LAB — TSH: TSH: 2.84 u[IU]/mL (ref 0.450–4.500)

## 2022-05-02 ENCOUNTER — Telehealth: Payer: Self-pay | Admitting: *Deleted

## 2022-05-02 ENCOUNTER — Encounter: Payer: Self-pay | Admitting: Family Medicine

## 2022-05-02 NOTE — Progress Notes (Signed)
  Care Coordination  Outreach Note  05/02/2022 Name: [name] MRN: [id number] DOB: Sep 16, 1940   Care Coordination Outreach Attempts: An unsuccessful telephone outreach was attempted today to offer the patient information about available care coordination services as a benefit of their health plan.   Follow Up Plan:  Additional outreach attempts will be made to offer the patient care coordination information and services.   Encounter Outcome:  No Answer  [place]  Direct Dial: [phone]

## 2022-05-03 NOTE — Progress Notes (Signed)
[facility] [place]                                     Cardiology Office Note:    Date:  05/04/2022   ID:  SHYANA KULAKOWSKI, DOB 19-Jan-1941, MRN [id number]  PCP:  Martyn Malay, MD  [facility] Cardiologist:  Mertie Moores, MD  [facility] Electrophysiologist:  None   Referring MD: Martyn Malay, MD   Follow up AS   History of Present Illness:    Yvonne [name] is a 81 y.o. female with a hx of arthritis, carotid artery disease, borderline diabetes, HTN, hypothyroidism, remote stroke with hemiparesis, breast cancer s/p lumpectomy and aortic stenosis who is here today for follow up in the structural heart clinic.  She had a stroke at age 23 and is known to have a chronic left internal carotid artery occlusion. She has had right sided arm and leg weakness since her stroke and has neuropathy in both feet. She had a craniotomy in her 67s with removal of a benign tumor.   Her aortic stenosis has been moderate and followed by Dr. Acie Fredrickson for several years. 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 Angelena Form in October 2021. Her AS was felt to be moderate and she was asymptomatic. Continued surveillance was recommended. She has been followed every 6 months with serial echos/OVs by our team. Last echo in 10/2021 showed normal LV function and continued moderate AS with a mean gradient of 28 mmhg, AVA 1.1cm2 and DVI of 0.34 as well as trivial AI. Not very active but not limited by any cardiac symptoms.    Today the patient presents to clinic for follow up. Here with her friend.  Three weeks ago she slipped in the grass and fell and has felt unwell since that. She had felt some dizziness but no syncope. She occasionally gets chest pain that she describes as an soreness and mild. The most activity she gets is walking around [place]. Has to hold on to the buggy. Just feeling like  she is slowing down and gernally unwell. She feels like she has no energy and feels negative.     Past Medical History:  Diagnosis Date   Anemia    Anxiety    Aortic stenosis    severe by 2021 echo    Arthritis    Bilateral carotid artery disease ([place]) 02/04/2017   Carotid US 3/22: R 1-39; L 100   Depression, recurrent ([place]) 02/10/2019   Hypertension    Hypothyroidism    Impaired functional mobility, balance, gait, and endurance 09/02/2015   Malignant neoplasm of lower-outer quadrant of left breast of female, estrogen receptor positive ([place]) 04/24/2021   Mild cognitive impairment with memory loss 09/02/2015   Osteopenia    Pre-diabetes    Stroke ([place])    pt was 40    Past Surgical History:  Procedure Laterality Date   ABDOMINAL HYSTERECTOMY     BACK SURGERY     2019   BRAIN SURGERY     age 70's   BREAST LUMPECTOMY WITH RADIOACTIVE SEED AND SENTINEL LYMPH NODE BIOPSY Left 05/17/2021   Procedure: LEFT BREAST SEED LOCALIZED LUMPECTOMY (BRACKETED) WITH SENTINEL LYMPH NODE BIOPSY;  Surgeon: Erroll Luna, MD;  Location: [facility];  Service: General;  Laterality: Left;  PEC BLOCK 90 MINUTES ROOM 9   RE-EXCISION OF BREAST LUMPECTOMY Left 06/08/2021   Procedure: RE-EXCISION OF LEFT BREAST LUMPECTOMY;  Surgeon: Erroll Luna, MD;  Location: [facility];  Service: General;  Laterality: Left;   TOE AMPUTATION  2013   2nd toe on right foot, hammer toe   TONSILLECTOMY     TOTAL HIP ARTHROPLASTY Left 03/19/2017   Procedure: LEFT TOTAL HIP ARTHROPLASTY ANTERIOR APPROACH;  Surgeon: Mcarthur Rossetti, MD;  Location: [facility];  Service: Orthopedics;  Laterality: Left;    Current Medications: Current Meds  Medication Sig   amitriptyline (ELAVIL) 50 MG tablet Take 1 tablet (50 mg total) by mouth at bedtime.   amLODipine (NORVASC) 5 MG tablet Take 1 tablet (5 mg total) by mouth at bedtime.   aspirin 81 MG chewable tablet Chew 1 tablet (81 mg total) by mouth daily.   atorvastatin (LIPITOR)  20 MG tablet Take 1 tablet (20 mg total) by mouth daily.   benazepril (LOTENSIN) 40 MG tablet Take 1 tablet (40 mg total) by mouth daily.   gabapentin (NEURONTIN) 100 MG capsule 1 tablet in the morning and 2 at night   hydrochlorothiazide (MICROZIDE) 12.5 MG capsule Take 1 capsule (12.5 mg total) by mouth daily.   levothyroxine (SYNTHROID) 88 MCG tablet Take 1 tablet (88 mcg total) by mouth every morning. 30 minutes before food   Misc Natural Products (OSTEO BI-FLEX ADV TRIPLE ST) TABS Take 1 tablet by mouth daily. '60mg'$  of Vitamin C  (Ascorbic Acid); '2mg'$  Manganese (Manganese Sulfate); '35mg'$  Sodium; '1500mg'$  Glucosamine HCI; '100mg'$  5-Loxin (Boswellia Serrata Extract resin); '1103mg'$  (Chondroitin/MSM Complex (Chrondroitin Sulfate, Methylsulfonylmethane, Collagen (Hydrolyzed Gelatin), Boswellia Serrata (resin); Boron (Bororganic Glycine); Hyaluronic Acid   Multiple Vitamins-Minerals (CENTRUM SILVER ADULT 50+) TABS Take 1 tablet by mouth daily.   oxymetazoline (AFRIN) 0.05 % nasal spray Place 1 spray into both nostrils 2 (two) times daily as needed for congestion.   tamoxifen (NOLVADEX) 20 MG tablet TAKE 1 TABLET EVERY DAY   venlafaxine XR (EFFEXOR-XR) 37.5 MG 24 hr capsule TAKE 1 CAPSULE EVERY DAY WITH BREAKFAST   VITAMIN E/D-ALPHA PO Take 1 tablet by mouth daily.     Allergies:   Patient has no known allergies.   Social History   Socioeconomic History   Marital status: Widowed    Spouse name: Not on file   Number of children: 2   Years of education: masters   Highest education level: Not on file  Occupational History   Occupation: Primary school teacher: RETIRED  Tobacco Use   Smoking status: Never   Smokeless tobacco: Never  Vaping Use   Vaping Use: Never used  Substance and Sexual Activity   Alcohol use: Not Currently    Alcohol/week: 1.0 standard drink of alcohol    Types: 1 Glasses of wine per week   Drug use: No   Sexual activity: Not Currently    Birth control/protection:  Post-menopausal  Other Topics Concern   Not on file  Social History Narrative   Health Care POA:    Emergency Contact: son, Doreene Eland, (c) [phone]   End of Life Plan:    Who lives with you: lives alone now    Any pets: 2 cats, Sadie and Katie   Diet: Pt has a varied diet and tries to limit sweets.    Exercise: Pt walks on treadmill 10 minutes a day and uses bike 10 minutes a day.   Seatbelts: Pt reports wearing seatbelt when  in vehicles.    Sun Exposure/Protection: Pt does not use sun protection.   Hobbies: reading, watching movies, writing      Husband passed away about 10 years ago    Social Determinants of Health   Financial Resource Strain: Low Risk  (06/23/2021)   Overall Financial Resource Strain (CARDIA)    Difficulty of Paying Living Expenses: Not very hard  Food Insecurity: No Food Insecurity (06/07/2018)   Hunger Vital Sign    Worried About Running Out of Food in the Last Year: Never true    Ran Out of Food in the Last Year: Never true  Transportation Needs: No Transportation Needs (06/23/2021)   PRAPARE - Hydrologist (Medical): No    Lack of Transportation (Non-Medical): No  Physical Activity: Unknown (06/07/2018)   Exercise Vital Sign    Days of Exercise per Week: Patient refused    Minutes of Exercise per Session: Patient refused  Stress: No Stress Concern Present (06/23/2021)   [place]    Feeling of Stress : Only a little  Social Connections: Moderately Integrated (06/23/2021)   Social Connection and Isolation Panel [NHANES]    Frequency of Communication with Friends and Family: More than three times a week    Frequency of Social Gatherings with Friends and Family: Once a week    Attends Religious Services: More than 4 times per year    Active Member of [employer] or Organizations: Yes    Attends Archivist Meetings: Never    Marital Status: Widowed      Family History: The patient's family history includes Heart disease in her father; Thyroid disease in her mother.  ROS:   Please see the history of present illness.    All other systems reviewed and are negative.  EKGs/Labs/Other Studies Reviewed:    The following studies were reviewed today:  Echo 05/04/22 IMPRESSIONS  1. Left ventricular ejection fraction, by estimation, is 70 to 75%. The left ventricle has hyperdynamic function. The left ventricle has no regional wall motion abnormalities. There is severe asymmetric left ventricular hypertrophy of the basal-septal  segment. Left ventricular diastolic parameters are consistent with Grade II diastolic dysfunction (pseudonormalization). Elevated left atrial pressure. The average left ventricular global longitudinal strain is -22.1 %.  2. Right ventricular systolic function is normal. The right ventricular size is normal. Tricuspid regurgitation signal is inadequate for assessing PA pressure.  3. Left atrial size was severely dilated.  4. The mitral valve is degenerative. No evidence of mitral valve regurgitation. Mild mitral stenosis. The mean mitral valve gradient is 4.0 mmHg with average heart rate of 68 bpm. Moderate mitral annular calcification.  5. The inferior vena cava is normal in size with greater than 50% respiratory variability, suggesting right atrial pressure of 3 mmHg.  6. The aortic valve is calcified. There is severe calcifcation of the aortic valve. Aortic valve regurgitation is mild. Severe aortic valve stenosis. Vmax 4.3 m/s, MG 45 mmHg, AVA 0.9 cm^2, DI 0.25  EKG:  EKG is NOT ordered today.  Recent Labs: 03/01/2022: ALT 17; BUN 16; Creatinine, Ser 0.88; Hemoglobin 13.5; Platelets 337; Potassium 4.4; Sodium 136 04/30/2022: TSH 2.840  Recent Lipid Panel    Component Value Date/Time   CHOL 125 04/30/2022 1508   TRIG 154 (H) 04/30/2022 1508   HDL 44 04/30/2022 1508   CHOLHDL 2.8 04/30/2022 1508   CHOLHDL 3.1  07/25/2016 1155   VLDL 38 (H) 07/25/2016  1155   LDLCALC 55 04/30/2022 1508   LDLDIRECT 57 10/20/2020 1226   LDLDIRECT 89.0 12/08/2014 1019     Risk Assessment/Calculations:       Physical Exam:    VS:  BP 120/68 (BP Location: Right Arm, Patient Position: Sitting, Cuff Size: Normal)   Pulse 74   Ht '5\' 1"'$  (1.549 m)   Wt 161 lb 4 oz (73.1 kg)   SpO2 91%   BMI 30.47 kg/m     Wt Readings from Last 3 Encounters:  05/04/22 161 lb 4 oz (73.1 kg)  04/30/22 164 lb 3.2 oz (74.5 kg)  04/10/22 163 lb (73.9 kg)     GEN:  Well nourished, well developed in no acute distress HEENT: Normal NECK: No JVD LYMPHATICS: No lymphadenopathy CARDIAC: RRR, harsh 4/6 SEM @ RUSB. No rubs, gallops RESPIRATORY:  Clear to auscultation without rales, wheezing or rhonchi  ABDOMEN: Soft, non-tender, non-distended MUSCULOSKELETAL:  No edema; No deformity  SKIN: Warm and dry NEUROLOGIC:  Alert and oriented x 3 PSYCHIATRIC:  Normal affect   ASSESSMENT:    1. Nonrheumatic aortic valve stenosis   2. Essential hypertension   3. History of CVA (cerebrovascular accident)     PLAN:    In order of problems listed above:  Aortic valve stenosis: she has been followed for asymptomatic moderate AS. Echo today shows progression to severe with a mean gradient of 45 mmhg. There is also severe asymmetric LVH of the basal septal segment. Fortunately, her EF remains normal 70%. She also has NYHA class II symptoms c/w progression of AS. She and I had a long discussion and both feel that we should start the TAVR work up. BMET/CBC today in anticipation of [facility] 11/30 with Dr. Angelena Form.  I have reviewed the risks, indications, and alternatives to cardiac catheterization and possible angioplasty/stenting with the patient. Risks include but are not limited to bleeding, infection, vascular injury, stroke, myocardial infection, arrhythmia, kidney injury, radiation-related injury in the case of prolonged fluoroscopy use,  emergency cardiac surgery, and death. The patient understands the risks of serious complication is low (<9%).    HTN: BP well controlled. No changes made  Hx of CVA: continue on Asprin and statin.        Medication Adjustments/Labs and Tests Ordered: Current medicines are reviewed at length with the patient today.  Concerns regarding medicines are outlined above.  Orders Placed This Encounter  Procedures   CBC   Basic Metabolic Panel (BMET)   EKG 12-Lead   No orders of the defined types were placed in this encounter.   Patient Instructions  Medication Instructions:  Your physician recommends that you continue on your current medications as directed. Please refer to the Current Medication list given to you today.  *If you need a refill on your cardiac medications before your next appointment, please call your pharmacy*   Lab Work: Lab work to be done today--CBC and BMP If you have labs (blood work) drawn today and your tests are completely normal, you will receive your results only by: [place] (if you have MyChart) OR A paper copy in the mail If you have any lab test that is abnormal or we need to change your treatment, we will call you to review the results.   Testing/Procedures: Your physician has requested that you have a cardiac catheterization. Cardiac catheterization is used to diagnose and/or treat various heart conditions. Doctors may recommend this procedure for a number of different reasons. The most common reason is  to evaluate chest pain. Chest pain can be a symptom of coronary artery disease (CAD), and cardiac catheterization can show whether plaque is narrowing or blocking your heart's arteries. This procedure is also used to evaluate the valves, as well as measure the blood flow and oxygen levels in different parts of your heart. For further information please visit [email or web address]. Please follow instruction sheet, as given. Scheduled for November  30   Follow-Up: At [facility], you and your health needs are our priority.  As part of our continuing mission to provide you with exceptional heart care, we have created designated Provider Care Teams.  These Care Teams include your primary Cardiologist (physician) and Advanced Practice Providers (APPs -  Physician Assistants and Nurse Practitioners) who all work together to provide you with the care you need, when you need it.  We recommend signing up for the patient portal called "MyChart".  Sign up information is provided on this After Visit Summary.  MyChart is used to connect with patients for Virtual Visits (Telemedicine).  Patients are able to view lab/test results, encounter notes, upcoming appointments, etc.  Non-urgent messages can be sent to your provider as well.   To learn more about what you can do with MyChart, go to [email or web address].    Your next appointment:   To be determined after procedure  The format for your next appointment:   In Person  Provider:     Other Instructions         Cardiac/Peripheral Catheterization   You are scheduled for a Cardiac Catheterization on Thursday, November 30 with Dr. Lauree Chandler.  1. Please arrive at the Main Entrance A at [facility]: [place], [place] [address] on November 30 at 10:00 AM (This time is two hours before your procedure to ensure your preparation). Free valet parking service is available. You will check in at ADMITTING. The support person will be asked to wait in the waiting room.  It is OK to have someone drop you off and come back when you are ready to be discharged.        Special note: Every effort is made to have your procedure done on time. Please understand that emergencies sometimes delay scheduled procedures.   . 2. Diet: Do not eat solid foods after midnight.  You may have clear liquids until 5 AM the day of the procedure.  3. Labs: done in office on  05/04/22  4. Medication instructions in preparation for your procedure:   Contrast Allergy: No   Do not take hydrochlorothiazide the morning of the procedure.  On the morning of your procedure, take Aspirin 81 mg and any morning medicines NOT listed above.  You may use sips of water.  5. Plan to go home the same day, you will only stay overnight if medically necessary. 6. You MUST have a responsible adult to drive you home. 7. An adult MUST be with you the first 24 hours after you arrive home. 8. Bring a current list of your medications, and the last time and date medication taken. 9. Bring ID and current insurance cards. 10.Please wear clothes that are easy to get on and off and wear slip-on shoes.  Thank you for allowing [place] to care for you!   -- [facility]   Important Information About Sugar            Signed, Angelena Form, PA-C  05/04/2022 6:38 PM    [facility]

## 2022-05-04 ENCOUNTER — Ambulatory Visit (HOSPITAL_COMMUNITY): Payer: Medicare HMO | Attending: Physician Assistant

## 2022-05-04 ENCOUNTER — Ambulatory Visit: Payer: Medicare HMO | Attending: Cardiology | Admitting: Physician Assistant

## 2022-05-04 VITALS — BP 120/68 | HR 74 | Ht 61.0 in | Wt 161.2 lb

## 2022-05-04 DIAGNOSIS — I350 Nonrheumatic aortic (valve) stenosis: Secondary | ICD-10-CM

## 2022-05-04 DIAGNOSIS — Z8673 Personal history of transient ischemic attack (TIA), and cerebral infarction without residual deficits: Secondary | ICD-10-CM | POA: Diagnosis not present

## 2022-05-04 DIAGNOSIS — I342 Nonrheumatic mitral (valve) stenosis: Secondary | ICD-10-CM | POA: Diagnosis not present

## 2022-05-04 DIAGNOSIS — I062 Rheumatic aortic stenosis with insufficiency: Secondary | ICD-10-CM

## 2022-05-04 DIAGNOSIS — I10 Essential (primary) hypertension: Secondary | ICD-10-CM

## 2022-05-04 LAB — ECHOCARDIOGRAM COMPLETE
AR max vel: 0.88 cm2
AV Area VTI: 0.97 cm2
AV Area mean vel: 0.83 cm2
AV Mean grad: 37.6 mmHg
AV Peak grad: 66.6 mmHg
Ao pk vel: 4.08 m/s
Area-P 1/2: 2.96 cm2
P 1/2 time: 557 msec
S' Lateral: 1.4 cm

## 2022-05-04 NOTE — Progress Notes (Addendum)
Pre Surgical Assessment: 5 M Walk Test  55M=16.11f  5 Meter Walk Test- trial 1: 9.73 seconds 5 Meter Walk Test- trial 2: 10.69 seconds 5 Meter Walk Test- trial 3: 8.88 seconds 5 Meter Walk Test Average: 9.76  seconds ,  Procedure Type: Isolated AVR Perioperative Outcome Estimate % Operative Mortality 3.64% Morbidity & Mortality 8.77% Stroke 2.28% Renal Failure 0.907% Reoperation 2.89% Prolonged Ventilation 4.14% Deep Sternal Wound Infection 0.042% [place] [facility]14 [facility] 5.03% Short Hospital Stay (<6 days)* 38.7%

## 2022-05-04 NOTE — Patient Instructions (Addendum)
Medication Instructions:  Your physician recommends that you continue on your current medications as directed. Please refer to the Current Medication list given to you today.  *If you need a refill on your cardiac medications before your next appointment, please call your pharmacy*   Lab Work: Lab work to be done today--CBC and BMP If you have labs (blood work) drawn today and your tests are completely normal, you will receive your results only by: [place] (if you have MyChart) OR A paper copy in the mail If you have any lab test that is abnormal or we need to change your treatment, we will call you to review the results.   Testing/Procedures: Your physician has requested that you have a cardiac catheterization. Cardiac catheterization is used to diagnose and/or treat various heart conditions. Doctors may recommend this procedure for a number of different reasons. The most common reason is to evaluate chest pain. Chest pain can be a symptom of coronary artery disease (CAD), and cardiac catheterization can show whether plaque is narrowing or blocking your heart's arteries. This procedure is also used to evaluate the valves, as well as measure the blood flow and oxygen levels in different parts of your heart. For further information please visit [email or web address]. Please follow instruction sheet, as given. Scheduled for November 30   Follow-Up: At [facility], you and your health needs are our priority.  As part of our continuing mission to provide you with exceptional heart care, we have created designated Provider Care Teams.  These Care Teams include your primary Cardiologist (physician) and Advanced Practice Providers (APPs -  Physician Assistants and Nurse Practitioners) who all work together to provide you with the care you need, when you need it.  We recommend signing up for the patient portal called "MyChart".  Sign up information is provided on this After Visit  Summary.  MyChart is used to connect with patients for Virtual Visits (Telemedicine).  Patients are able to view lab/test results, encounter notes, upcoming appointments, etc.  Non-urgent messages can be sent to your provider as well.   To learn more about what you can do with MyChart, go to [email or web address].    Your next appointment:   To be determined after procedure  The format for your next appointment:   In Person  Provider:     Other Instructions         Cardiac/Peripheral Catheterization   You are scheduled for a Cardiac Catheterization on Thursday, November 30 with Dr. Lauree Chandler.  1. Please arrive at the Main Entrance A at [facility]: [place], [place] [address] on November 30 at 10:00 AM (This time is two hours before your procedure to ensure your preparation). Free valet parking service is available. You will check in at ADMITTING. The support person will be asked to wait in the waiting room.  It is OK to have someone drop you off and come back when you are ready to be discharged.        Special note: Every effort is made to have your procedure done on time. Please understand that emergencies sometimes delay scheduled procedures.   . 2. Diet: Do not eat solid foods after midnight.  You may have clear liquids until 5 AM the day of the procedure.  3. Labs: done in office on 05/04/22  4. Medication instructions in preparation for your procedure:   Contrast Allergy: No   Do not take hydrochlorothiazide the morning of the procedure.  On the morning of your procedure, take Aspirin 81 mg and any morning medicines NOT listed above.  You may use sips of water.  5. Plan to go home the same day, you will only stay overnight if medically necessary. 6. You MUST have a responsible adult to drive you home. 7. An adult MUST be with you the first 24 hours after you arrive home. 8. Bring a current list of your medications, and the last time  and date medication taken. 9. Bring ID and current insurance cards. 10.Please wear clothes that are easy to get on and off and wear slip-on shoes.  Thank you for allowing [place] to care for you!   -- [place]   Important Information About Sugar

## 2022-05-05 LAB — CBC
Hematocrit: 41.5 % (ref 34.0–46.6)
Hemoglobin: 13.1 g/dL (ref 11.1–15.9)
MCH: 29.3 pg (ref 26.6–33.0)
MCHC: 31.6 g/dL (ref 31.5–35.7)
MCV: 93 fL (ref 79–97)
Platelets: 338 10*3/uL (ref 150–450)
RBC: 4.47 x10E6/uL (ref 3.77–5.28)
RDW: 13.5 % (ref 11.7–15.4)
WBC: 10.4 10*3/uL (ref 3.4–10.8)

## 2022-05-05 LAB — BASIC METABOLIC PANEL
BUN/Creatinine Ratio: 17 (ref 12–28)
BUN: 12 mg/dL (ref 8–27)
CO2: 24 mmol/L (ref 20–29)
Calcium: 9.1 mg/dL (ref 8.7–10.3)
Chloride: 100 mmol/L (ref 96–106)
Creatinine, Ser: 0.72 mg/dL (ref 0.57–1.00)
Glucose: 133 mg/dL — ABNORMAL HIGH (ref 70–99)
Potassium: 4.1 mmol/L (ref 3.5–5.2)
Sodium: 139 mmol/L (ref 134–144)
eGFR: 83 mL/min/{1.73_m2} (ref >59)

## 2022-05-08 NOTE — Progress Notes (Signed)
  Care Coordination  Outreach Note  05/08/2022 Name: Yvonne [name] MRN: [id number] DOB: 05/29/1941   Care Coordination Outreach Attempts: A second unsuccessful outreach was attempted today to offer the patient with information about available care coordination services as a benefit of their health plan.     Follow Up Plan:  Additional outreach attempts will be made to offer the patient care coordination information and services.   Encounter Outcome:  No Answer  [place]  Direct Dial: [phone]

## 2022-05-14 NOTE — Progress Notes (Signed)
  Care Coordination  Outreach Note  05/14/2022 Name: Yvonne [name] MRN: [id number] DOB: [birth year]   Care Coordination Outreach Attempts: A third unsuccessful outreach was attempted today to offer the patient with information about available care coordination services as a benefit of their health plan.   Follow Up Plan:  No further outreach attempts will be made at this time. We have been unable to contact the patient to offer or enroll patient in care coordination services  Encounter Outcome:  No Answer  [place]: [phone]

## 2022-05-16 ENCOUNTER — Telehealth: Payer: Self-pay | Admitting: *Deleted

## 2022-05-16 NOTE — Telephone Encounter (Signed)
Cardiac Catheterization scheduled at [facility] for: Thursday May 17, 2022 12 Noon Arrival time and place: [facility] Main Entrance A at: 10 AM  Nothing to eat after midnight prior to procedure, clear liquids until 5 AM day of procedure.  Medication instructions: -Hold:  HCTZ-AM of procedure  -Except hold medications usual morning medications can be taken with sips of water including aspirin 81 mg.  Confirmed patient has responsible adult to drive home post procedure and be with patient first 24 hours after arriving home.  Patient reports no new symptoms concerning for COVID-19 in the past 10 days.  Reviewed procedure instructions with patient.

## 2022-05-17 ENCOUNTER — Ambulatory Visit (HOSPITAL_COMMUNITY)
Admission: RE | Admit: 2022-05-17 | Discharge: 2022-05-17 | Disposition: A | Discharge status: Home / Self Care | Payer: Medicare HMO | Attending: Cardiovascular Disease | Admitting: Cardiovascular Disease

## 2022-05-17 ENCOUNTER — Encounter (HOSPITAL_COMMUNITY)
Admission: RE | Disposition: A | Discharge status: Home / Self Care | Payer: Self-pay | Source: Home / Self Care | Attending: Cardiovascular Disease

## 2022-05-17 ENCOUNTER — Other Ambulatory Visit: Payer: Self-pay

## 2022-05-17 ENCOUNTER — Other Ambulatory Visit: Payer: Self-pay | Admitting: Physician Assistant

## 2022-05-17 ENCOUNTER — Encounter: Payer: Self-pay | Admitting: Physician Assistant

## 2022-05-17 DIAGNOSIS — I2510 Atherosclerotic heart disease of native coronary artery without angina pectoris: Secondary | ICD-10-CM | POA: Diagnosis not present

## 2022-05-17 DIAGNOSIS — I350 Nonrheumatic aortic (valve) stenosis: Secondary | ICD-10-CM | POA: Insufficient documentation

## 2022-05-17 HISTORY — PX: RIGHT HEART CATH AND CORONARY ANGIOGRAPHY: CATH118264

## 2022-05-17 LAB — POCT I-STAT EG7
Acid-base deficit: 1 mmol/L (ref 0.0–2.0)
Bicarbonate: 24.7 mmol/L (ref 20.0–28.0)
Calcium, Ion: 1.16 mmol/L (ref 1.15–1.40)
HCT: 34 % — ABNORMAL LOW (ref 36.0–46.0)
Hemoglobin: 11.6 g/dL — ABNORMAL LOW (ref 12.0–15.0)
O2 Saturation: 73 %
Potassium: 3.9 mmol/L (ref 3.5–5.1)
Sodium: 143 mmol/L (ref 135–145)
TCO2: 26 mmol/L (ref 22–32)
pCO2, Ven: 43.8 mmHg — ABNORMAL LOW (ref 44–60)
pH, Ven: 7.359 (ref 7.25–7.43)
pO2, Ven: 40 mmHg (ref 32–45)

## 2022-05-17 SURGERY — RIGHT HEART CATH AND CORONARY ANGIOGRAPHY
Anesthesia: LOCAL

## 2022-05-17 MED ORDER — SODIUM CHLORIDE 0.9 % IV SOLN: 250.0000 mL | INTRAVENOUS | Status: DC | PRN

## 2022-05-17 MED ORDER — IOHEXOL 350 MG/ML SOLN
INTRAVENOUS | Status: DC | PRN
  Administered 2022-05-17: 45 mL

## 2022-05-17 MED ORDER — VERAPAMIL HCL 2.5 MG/ML IV SOLN
INTRAVENOUS | Status: AC
  Filled 2022-05-17: qty 2

## 2022-05-17 MED ORDER — ASPIRIN 81 MG PO CHEW: 81.0000 mg | CHEWABLE_TABLET | ORAL | Status: DC

## 2022-05-17 MED ORDER — MIDAZOLAM HCL 2 MG/2ML IJ SOLN
INTRAMUSCULAR | Status: AC
  Filled 2022-05-17: qty 2

## 2022-05-17 MED ORDER — LABETALOL HCL 5 MG/ML IV SOLN: 10.0000 mg | INTRAVENOUS | Status: DC | PRN

## 2022-05-17 MED ORDER — FENTANYL CITRATE (PF) 100 MCG/2ML IJ SOLN
INTRAMUSCULAR | Status: AC
  Filled 2022-05-17: qty 2

## 2022-05-17 MED ORDER — HEPARIN (PORCINE) IN NACL 1000-0.9 UT/500ML-% IV SOLN
INTRAVENOUS | Status: AC
  Filled 2022-05-17: qty 1000

## 2022-05-17 MED ORDER — SODIUM CHLORIDE 0.9% FLUSH: 3.0000 mL | Freq: Two times a day (BID) | INTRAVENOUS | Status: DC

## 2022-05-17 MED ORDER — FENTANYL CITRATE (PF) 100 MCG/2ML IJ SOLN
INTRAMUSCULAR | Status: DC | PRN
  Administered 2022-05-17: 25 ug via INTRAVENOUS

## 2022-05-17 MED ORDER — ONDANSETRON HCL 4 MG/2ML IJ SOLN: 4.0000 mg | Freq: Four times a day (QID) | INTRAMUSCULAR | Status: DC | PRN

## 2022-05-17 MED ORDER — VERAPAMIL HCL 2.5 MG/ML IV SOLN
INTRAVENOUS | Status: DC | PRN
  Administered 2022-05-17: 10 mL via INTRA_ARTERIAL

## 2022-05-17 MED ORDER — HEPARIN (PORCINE) IN NACL 1000-0.9 UT/500ML-% IV SOLN
INTRAVENOUS | Status: DC | PRN
  Administered 2022-05-17 (×2): 500 mL

## 2022-05-17 MED ORDER — HEPARIN SODIUM (PORCINE) 1000 UNIT/ML IJ SOLN
INTRAMUSCULAR | Status: AC
  Filled 2022-05-17: qty 10

## 2022-05-17 MED ORDER — SODIUM CHLORIDE 0.9 % WEIGHT BASED INFUSION
3.0000 mL/kg/h | INTRAVENOUS | Status: AC
  Administered 2022-05-17: 3 mL/kg/h via INTRAVENOUS

## 2022-05-17 MED ORDER — SODIUM CHLORIDE 0.9 % WEIGHT BASED INFUSION: 1.0000 mL/kg/h | INTRAVENOUS | Status: DC

## 2022-05-17 MED ORDER — HYDRALAZINE HCL 20 MG/ML IJ SOLN: 10.0000 mg | INTRAMUSCULAR | Status: DC | PRN

## 2022-05-17 MED ORDER — SODIUM CHLORIDE 0.9 % IV SOLN: INTRAVENOUS | Status: AC

## 2022-05-17 MED ORDER — SODIUM CHLORIDE 0.9% FLUSH: 3.0000 mL | INTRAVENOUS | Status: DC | PRN

## 2022-05-17 MED ORDER — ACETAMINOPHEN 325 MG PO TABS: 650.0000 mg | ORAL_TABLET | ORAL | Status: DC | PRN

## 2022-05-17 MED ORDER — LIDOCAINE HCL (PF) 1 % IJ SOLN
INTRAMUSCULAR | Status: DC | PRN
  Administered 2022-05-17 (×2): 2 mL via INTRADERMAL

## 2022-05-17 MED ORDER — HEPARIN SODIUM (PORCINE) 1000 UNIT/ML IJ SOLN
INTRAMUSCULAR | Status: DC | PRN
  Administered 2022-05-17: 4000 [IU] via INTRAVENOUS

## 2022-05-17 MED ORDER — MIDAZOLAM HCL 2 MG/2ML IJ SOLN
INTRAMUSCULAR | Status: DC | PRN
  Administered 2022-05-17: 1 mg via INTRAVENOUS

## 2022-05-17 MED ORDER — LIDOCAINE HCL (PF) 1 % IJ SOLN
INTRAMUSCULAR | Status: AC
  Filled 2022-05-17: qty 30

## 2022-05-17 SURGICAL SUPPLY — 12 items
CATH 5FR JL3.5 JR4 ANG PIG MP (CATHETERS) IMPLANT
CATH BALLN WEDGE 5F 110CM (CATHETERS) IMPLANT
DEVICE RAD COMP TR BAND LRG (VASCULAR PRODUCTS) IMPLANT
GLIDESHEATH SLEND SS 6F .021 (SHEATH) IMPLANT
GUIDEWIRE .025 260CM (WIRE) IMPLANT
GUIDEWIRE INQWIRE 1.5J.035X260 (WIRE) IMPLANT
INQWIRE 1.5J .035X260CM (WIRE) ×1
KIT HEART LEFT (KITS) ×1 IMPLANT
PACK CARDIAC CATHETERIZATION (CUSTOM PROCEDURE TRAY) ×1 IMPLANT
SHEATH GLIDE SLENDER 4/5FR (SHEATH) IMPLANT
TRANSDUCER W/STOPCOCK (MISCELLANEOUS) ×1 IMPLANT
TUBING CIL FLEX 10 FLL-RA (TUBING) ×1 IMPLANT

## 2022-05-17 NOTE — Discharge Instructions (Signed)

## 2022-05-17 NOTE — H&P (View-Only) (Signed)
Letter with pt instructions given to pt.

## 2022-05-17 NOTE — Interval H&P Note (Signed)
History and Physical Interval Note:  05/17/2022 12:47 PM  Yvonne [name]  has presented today for surgery, with the diagnosis of aortic stenosis.  The various methods of treatment have been discussed with the patient and family. After consideration of risks, benefits and other options for treatment, the patient has consented to  Procedure(s): RIGHT/LEFT HEART CATH AND CORONARY ANGIOGRAPHY (N/A) as a surgical intervention.  The patient's history has been reviewed, patient examined, no change in status, stable for surgery.  I have reviewed the patient's chart and labs.  Questions were answered to the patient's satisfaction.    Cath Lab Visit (complete for each Cath Lab visit)  Clinical Evaluation Leading to the Procedure:   ACS: No.  Non-ACS:    Anginal Classification: CCS II  Anti-ischemic medical therapy: No Therapy  Non-Invasive Test Results: No non-invasive testing performed  Prior CABG: No previous CABG        Lauree Chandler

## 2022-05-17 NOTE — Progress Notes (Signed)
Letter with pt instructions given to pt.

## 2022-05-18 ENCOUNTER — Encounter (HOSPITAL_COMMUNITY): Payer: Self-pay | Admitting: Cardiovascular Disease

## 2022-05-18 LAB — POCT I-STAT 7, (LYTES, BLD GAS, ICA,H+H)
Acid-base deficit: 2 mmol/L (ref 0.0–2.0)
Bicarbonate: 23 mmol/L (ref 20.0–28.0)
Calcium, Ion: 1.15 mmol/L (ref 1.15–1.40)
HCT: 34 % — ABNORMAL LOW (ref 36.0–46.0)
Hemoglobin: 11.6 g/dL — ABNORMAL LOW (ref 12.0–15.0)
O2 Saturation: 93 %
Potassium: 3.8 mmol/L (ref 3.5–5.1)
Sodium: 143 mmol/L (ref 135–145)
TCO2: 24 mmol/L (ref 22–32)
pCO2 arterial: 38.4 mmHg (ref 32–48)
pH, Arterial: 7.386 (ref 7.35–7.45)
pO2, Arterial: 69 mmHg — ABNORMAL LOW (ref 83–108)

## 2022-06-04 ENCOUNTER — Ambulatory Visit (HOSPITAL_COMMUNITY)
Admission: RE | Admit: 2022-06-04 | Discharge: 2022-06-04 | Disposition: A | Discharge status: Home / Self Care | Payer: Medicare HMO | Source: Ambulatory Visit | Attending: Physician Assistant | Admitting: Physician Assistant

## 2022-06-04 ENCOUNTER — Encounter (HOSPITAL_COMMUNITY): Payer: Self-pay

## 2022-06-04 ENCOUNTER — Encounter: Payer: Self-pay | Admitting: Family Medicine

## 2022-06-04 ENCOUNTER — Telehealth: Payer: Self-pay | Admitting: Family Medicine

## 2022-06-04 DIAGNOSIS — R911 Solitary pulmonary nodule: Secondary | ICD-10-CM | POA: Insufficient documentation

## 2022-06-04 DIAGNOSIS — E041 Nontoxic single thyroid nodule: Secondary | ICD-10-CM | POA: Insufficient documentation

## 2022-06-04 DIAGNOSIS — I350 Nonrheumatic aortic (valve) stenosis: Secondary | ICD-10-CM | POA: Insufficient documentation

## 2022-06-04 DIAGNOSIS — Z01810 Encounter for preprocedural cardiovascular examination: Secondary | ICD-10-CM | POA: Diagnosis not present

## 2022-06-04 DIAGNOSIS — I712 Thoracic aortic aneurysm, without rupture, unspecified: Secondary | ICD-10-CM | POA: Insufficient documentation

## 2022-06-04 DIAGNOSIS — Z01818 Encounter for other preprocedural examination: Secondary | ICD-10-CM | POA: Diagnosis not present

## 2022-06-04 MED ORDER — IOHEXOL 350 MG/ML SOLN
95.0000 mL | Freq: Once | INTRAVENOUS | Status: AC | PRN
  Administered 2022-06-04: 95 mL via INTRAVENOUS

## 2022-06-04 NOTE — Telephone Encounter (Signed)
Caregiver dropped off form at front desk for [place].  Verified that patient section of form has been completed.  Last DOS/WCC with PCP was 04/30/22.  Placed form in red team folder to be completed by clinical staff.  Creig Hines

## 2022-06-04 NOTE — Telephone Encounter (Signed)
Reviewed form and placed it in PCP's box for completion.  Ozella Almond, [place]

## 2022-06-05 NOTE — Telephone Encounter (Signed)
Reviewed, completed, and signed form.  Note routed to RN team inbasket and placed completed form in RN Wall pocket in the front office.  Martyn Malay, MD

## 2022-06-06 ENCOUNTER — Institutional Professional Consult (permissible substitution) (INDEPENDENT_AMBULATORY_CARE_PROVIDER_SITE_OTHER): Payer: Medicare HMO | Admitting: Surgery

## 2022-06-06 ENCOUNTER — Encounter: Payer: Self-pay | Admitting: Surgery

## 2022-06-06 VITALS — BP 144/82 | HR 82 | Resp 20 | Ht 61.0 in | Wt 162.0 lb

## 2022-06-06 DIAGNOSIS — I350 Nonrheumatic aortic (valve) stenosis: Secondary | ICD-10-CM | POA: Diagnosis not present

## 2022-06-06 NOTE — Telephone Encounter (Signed)
Form placed up front for pick up.   Copy made for batch scanning.   Patient aware.  

## 2022-06-06 NOTE — Progress Notes (Signed)
Patient ID: Yvonne [name], female   DOB: 1940/10/12, 81 y.o.   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  She has a history of moderate aortic stenosis followed by Dr. Acie Fredrickson for several years.  An echocardiogram in September 2021 had shown a severely calcified and restricted aortic valve with a mean gradient of 33 mmHg and a valve area of 0.8 cm with a dimensionless index of 0.23.  It was felt that this may be paradoxical low-flow/low gradient severe aortic stenosis and she was seen by Dr. Angelena Form at that time.  She was felt to have moderate aortic stenosis and was asymptomatic and decision was made to continue following her.  She subsequently had follow-up echocardiograms in March and October 2022 and both showed aortic valve mean gradients in the 25-28 range.  An echo done on 10/27/2021 showed an aortic valve mean gradient of 27 mmHg which was stable.  Her most recent echo on 05/04/2022 showed an increase in the mean gradient to 37.6 mmHg with a peak gradient of 67 mmHg and a valve area of 0.97 cm.  There is mild aortic insufficiency.  The aortic valve remains severely calcified and restricted.  Left ventricular ejection fraction was 70 to 75%.  She recently  presented to the heart valve clinic reporting a recent fall after slipping in the grass and has not felt well since.  She reports occasional episodes of chest discomfort that are mild.  She has noted exertional fatigue and shortness of breath.  She has had some episodes of dizziness but no syncope.  She denies orthopnea but has some peripheral edema.  She is here today with her friend and caretaker.  She still walks using a cane but is not very active.  Past Medical History:  Diagnosis Date   Anemia    Anxiety    Aortic stenosis    severe by 2021 echo    Arthritis    Bilateral carotid artery disease ([place]) 02/04/2017   Carotid US 3/22: R 1-39; L 100   Depression, recurrent ([place]) 02/10/2019   Hypertension    Hypothyroidism    Impaired functional mobility, balance, gait, and endurance 09/02/2015   Malignant neoplasm of lower-outer quadrant of left breast of female, estrogen receptor positive ([place]) 04/24/2021   Mild cognitive impairment with memory loss 09/02/2015   Osteopenia    Pre-diabetes    Stroke ([place])    pt was 40    Past Surgical History:  Procedure Laterality Date   ABDOMINAL HYSTERECTOMY     BACK SURGERY     2019   BRAIN SURGERY     age 69's   BREAST LUMPECTOMY WITH RADIOACTIVE SEED AND SENTINEL LYMPH NODE BIOPSY Left  05/17/2021   Procedure: LEFT BREAST SEED LOCALIZED LUMPECTOMY (BRACKETED) WITH SENTINEL LYMPH NODE BIOPSY;  Surgeon: Erroll Luna, MD;  Location: [facility];  Service: General;  Laterality: Left;  PEC BLOCK 90 MINUTES ROOM 9   RE-EXCISION OF BREAST LUMPECTOMY Left 06/08/2021   Procedure: RE-EXCISION OF LEFT BREAST LUMPECTOMY;  Surgeon: Erroll Luna, MD;  Location: [facility];  Service: General;  Laterality: Left;   RIGHT HEART CATH AND CORONARY ANGIOGRAPHY N/A 05/17/2022   Procedure: RIGHT HEART CATH AND CORONARY ANGIOGRAPHY;  Surgeon: Burnell Blanks, MD;  Location: [facility];  Service: Cardiovascular;  Laterality: N/A;   TOE AMPUTATION  2013    2nd toe on right foot, hammer toe   TONSILLECTOMY     TOTAL HIP ARTHROPLASTY Left 03/19/2017   Procedure: LEFT TOTAL HIP ARTHROPLASTY ANTERIOR APPROACH;  Surgeon: Mcarthur Rossetti, MD;  Location: [facility];  Service: Orthopedics;  Laterality: Left;    Family History  Problem Relation Age of Onset   Thyroid disease Mother    Heart disease Father     Social History   Socioeconomic History   Marital status: Widowed    Spouse name: Not on file   Number of children: 2   Years of education: masters   Highest education level: Not on file  Occupational History   Occupation: Primary school teacher: RETIRED  Tobacco Use   Smoking status: Never   Smokeless tobacco: Never  Vaping Use   Vaping Use: Never used  Substance and Sexual Activity   Alcohol use: Not Currently    Alcohol/week: 1.0 standard drink of alcohol    Types: 1 Glasses of wine per week   Drug use: No   Sexual activity: Not Currently    Birth control/protection: Post-menopausal  Other Topics Concern   Not on file  Social History Narrative   Health Care POA:    Emergency Contact: son, Doreene Eland, (c) [phone]   End of Life Plan:    Who lives with you: lives alone now    Any pets: 2 cats, Sadie and Katie   Diet: Pt has a varied diet and tries to limit sweets.    Exercise: Pt walks on treadmill 10 minutes a day and uses bike 10 minutes a day.   Seatbelts: Pt reports wearing seatbelt when in vehicles.    Sun Exposure/Protection: Pt does not use sun protection.   Hobbies: reading, watching movies, writing      Husband passed away about 10 years ago    Social Determinants of Health   Financial Resource Strain: Low Risk  (06/23/2021)   Overall Financial Resource Strain (CARDIA)    Difficulty of Paying Living Expenses: Not very hard  Food Insecurity: No Food Insecurity (06/07/2018)   Hunger Vital Sign    Worried About Running Out of Food in the Last Year: Never true    Ran Out of Food in the Last Year:  Never true  Transportation Needs: No Transportation Needs (06/23/2021)   PRAPARE - Hydrologist (Medical): No    Lack of Transportation (Non-Medical): No  Physical Activity: Unknown (06/07/2018)   Exercise Vital Sign    Days of Exercise per Week: Patient refused    Minutes of Exercise per Session: Patient refused  Stress: No Stress Concern Present (06/23/2021)   [place]    Feeling of Stress : Only a little  Social Connections: Moderately Integrated (06/23/2021)   Social  Connection and Isolation Panel [NHANES]    Frequency of Communication with Friends and Family: More than three times a week    Frequency of Social Gatherings with Friends and Family: Once a week    Attends Religious Services: More than 4 times per year    Active Member of [employer] or Organizations: Yes    Attends Archivist Meetings: Never    Marital Status: Widowed  Intimate Partner Violence: Not At Risk (06/07/2018)   Humiliation, Afraid, Rape, and Kick questionnaire    Fear of Current or Ex-Partner: No    Emotionally Abused: No    Physically Abused: No    Sexually Abused: No    Prior to Admission medications   Medication Sig Start Date End Date Taking? Authorizing Provider  amitriptyline (ELAVIL) 50 MG tablet Take 1 tablet (50 mg total) by mouth at bedtime. 04/05/22  Yes Martyn Malay, MD  amLODipine (NORVASC) 5 MG tablet Take 1 tablet (5 mg total) by mouth at bedtime. 04/30/22  Yes Martyn Malay, MD  aspirin 81 MG chewable tablet Chew 1 tablet (81 mg total) by mouth daily. Patient taking differently: Chew 81 mg by mouth every evening. 12/23/19  Yes Martyn Malay, MD  atorvastatin (LIPITOR) 20 MG tablet Take 1 tablet (20 mg total) by mouth daily. 08/08/21  Yes Martyn Malay, MD  benazepril (LOTENSIN) 40 MG tablet Take 1 tablet (40 mg total) by mouth daily. 09/27/21  Yes Martyn Malay, MD  gabapentin  (NEURONTIN) 100 MG capsule 1 tablet in the morning and 2 at night 08/08/21  Yes Martyn Malay, MD  hydrochlorothiazide (MICROZIDE) 12.5 MG capsule Take 1 capsule (12.5 mg total) by mouth daily. 04/30/22  Yes Martyn Malay, MD  levothyroxine (SYNTHROID) 88 MCG tablet Take 1 tablet (88 mcg total) by mouth every morning. 30 minutes before food 04/30/22  Yes Martyn Malay, MD  Misc Natural Products (OSTEO BI-FLEX ADV TRIPLE ST) TABS Take 1 tablet by mouth in the morning. '60mg'$  of Vitamin C  (Ascorbic Acid); '2mg'$  Manganese (Manganese Sulfate); '35mg'$  Sodium; '1500mg'$  Glucosamine HCI; '100mg'$  5-Loxin (Boswellia Serrata Extract resin); '1103mg'$  (Chondroitin/MSM Complex (Chrondroitin Sulfate, Methylsulfonylmethane, Collagen (Hydrolyzed Gelatin), Boswellia Serrata (resin); Boron (Bororganic Glycine); Hyaluronic Acid   Yes [provider]  Multiple Vitamins-Minerals (CENTRUM SILVER ADULT 50+) TABS Take 1 tablet by mouth in the morning.   Yes [provider]  phenylephrine (NEO-SYNEPHRINE) 1 % nasal spray Place 1 drop into both nostrils 3 (three) times daily as needed for congestion.   Yes [provider]  tamoxifen (NOLVADEX) 20 MG tablet TAKE 1 TABLET EVERY DAY 04/19/22  Yes Truitt Merle, MD  venlafaxine XR (EFFEXOR-XR) 37.5 MG 24 hr capsule TAKE 1 CAPSULE EVERY DAY WITH BREAKFAST 04/23/22  Yes Truitt Merle, MD    Current Outpatient Medications  Medication Sig Dispense Refill   amitriptyline (ELAVIL) 50 MG tablet Take 1 tablet (50 mg total) by mouth at bedtime. 90 tablet 3   amLODipine (NORVASC) 5 MG tablet Take 1 tablet (5 mg total) by mouth at bedtime. 90 tablet 3   aspirin 81 MG chewable tablet Chew 1 tablet (81 mg total) by mouth daily. (Patient taking differently: Chew 81 mg by mouth every evening.) 90 tablet 3   atorvastatin (LIPITOR) 20 MG tablet Take 1 tablet (20 mg total) by mouth daily. 90 tablet 3   benazepril (LOTENSIN) 40 MG tablet Take 1 tablet (40 mg total) by mouth daily. 90  tablet 3   gabapentin (  NEURONTIN) 100 MG capsule 1 tablet in the morning and 2 at night 270 capsule 3   hydrochlorothiazide (MICROZIDE) 12.5 MG capsule Take 1 capsule (12.5 mg total) by mouth daily. 90 capsule 3   levothyroxine (SYNTHROID) 88 MCG tablet Take 1 tablet (88 mcg total) by mouth every morning. 30 minutes before food 90 tablet 3   Misc Natural Products (OSTEO BI-FLEX ADV TRIPLE ST) TABS Take 1 tablet by mouth in the morning. '60mg'$  of Vitamin C  (Ascorbic Acid); '2mg'$  Manganese (Manganese Sulfate); '35mg'$  Sodium; '1500mg'$  Glucosamine HCI; '100mg'$  5-Loxin (Boswellia Serrata Extract resin); '1103mg'$  (Chondroitin/MSM Complex (Chrondroitin Sulfate, Methylsulfonylmethane, Collagen (Hydrolyzed Gelatin), Boswellia Serrata (resin); Boron (Bororganic Glycine); Hyaluronic Acid     Multiple Vitamins-Minerals (CENTRUM SILVER ADULT 50+) TABS Take 1 tablet by mouth in the morning.     phenylephrine (NEO-SYNEPHRINE) 1 % nasal spray Place 1 drop into both nostrils 3 (three) times daily as needed for congestion.     tamoxifen (NOLVADEX) 20 MG tablet TAKE 1 TABLET EVERY DAY 90 tablet 10   venlafaxine XR (EFFEXOR-XR) 37.5 MG 24 hr capsule TAKE 1 CAPSULE EVERY DAY WITH BREAKFAST 90 capsule 10   No current facility-administered medications for this visit.    No Known Allergies    Review of Systems:   General:  normal appetite, + decreased energy, no weight gain, no weight loss, no fever  Cardiac:  + chest pain with exertion, no chest pain at rest, +SOB with mild exertion, no resting SOB, no PND, no orthopnea, no palpitations, + arrhythmia, + atrial fibrillation, + LE edema, + dizzy spells, no syncope  Respiratory:  + exertional shortness of breath, no home oxygen, no productive cough, no dry cough, no bronchitis, no wheezing, no hemoptysis, no asthma, no pain with inspiration or cough, no sleep apnea, no CPAP at night  GI:   no difficulty swallowing, no reflux, no frequent heartburn, no hiatal hernia, no abdominal  pain, no constipation, no diarrhea, no hematochezia, no hematemesis, no melena  GU:   no dysuria,  no frequency, no urinary tract infection, no hematuria,  no kidney stones,  no kidney disease  Vascular:  no pain suggestive of claudication, + pain in feet, no leg cramps, no varicose veins, no DVT, no non-healing foot ulcer  Neuro:   + stroke, no TIA's, no seizures, + headaches, no temporary blindness one eye,  no slurred speech, + peripheral neuropathy, no chronic pain, + instability of gait, + memory/cognitive dysfunction  Musculoskeletal: + arthritis, + joint swelling, + myalgias, + difficulty walking, + decreased mobility   Skin:   no rash, no itching, no skin infections, no pressure sores or ulcerations  Psych:   + anxiety, + depression, + nervousness, no unusual recent stress  Eyes:   no blurry vision, no floaters, no recent vision changes, no glasses or contacts  ENT:   + hearing loss, no loose or painful teeth, + dentures Hematologic:  no easy bruising, no abnormal bleeding, no clotting disorder, no frequent epistaxis  Endocrine:  no diabetes, does not check CBG's at home     Physical Exam:   BP (!) 144/82 (BP Location: Right Arm, Patient Position: Sitting)   Pulse 82   Resp 20   Ht '5\' 1"'$  (1.549 m)   Wt 162 lb (73.5 kg)   SpO2 93% Comment: ra  BMI 30.61 kg/m   General:  Elderly,  well-appearing  HEENT:  Unremarkable, NCAT, PERLA, EOMI  Neck:   no JVD, no bruits, no adenopathy  Chest:   clear to auscultation, symmetrical breath sounds, no wheezes, no rhonchi   CV:   RRR, 3/6 systolic murmur RSB, no diastolic murmur  Abdomen:  soft, non-tender, no masses   Extremities:  warm, well-perfused, pulses palpable at ankles, mild lower extremity edema  Rectal/GU  Deferred  Neuro:   Grossly non-focal and symmetrical throughout  Skin:   Clean and dry, no rashes, no breakdown  Diagnostic Tests:  ECHOCARDIOGRAM REPORT       Patient Name:   LANIAH GRIMM Date of Exam: 05/04/2022   Medical Rec #:  [id number]      Height:       61.0 in  Accession #:    [id number]     Weight:       164.2 lb  Date of Birth:  January 21, 1941     BSA:          1.737 m  Patient Age:    69 years       BP:           135/74 mmHg  Patient Gender: F              HR:           71 bpm.  Exam Location:  [facility]   Procedure: 2D Echo, Cardiac Doppler, Color Doppler and Strain Analysis   Indications:    I35.0 Aortic Stenosis    History:        Patient has prior history of Echocardiogram examinations,  most                 recent 10/27/2021. Stroke, Aortic Valve Disease; Risk                  Factors:Hypertension, Diabetes and Family History of  Coronary                 Artery Disease. History of Left Breast Cancer status post                  Lumpectomy and Radiation (2022).    Sonographer:    Deliah Boston RDCS  Referring Phys: Woodfin Ganja THOMPSON   IMPRESSIONS     1. Left ventricular ejection fraction, by estimation, is 70 to 75%. The  left ventricle has hyperdynamic function. The left ventricle has no  regional wall motion abnormalities. There is severe asymmetric left  ventricular hypertrophy of the basal-septal  segment. Left ventricular diastolic parameters are consistent with Grade  II diastolic dysfunction (pseudonormalization). Elevated left atrial  pressure. The average left ventricular global longitudinal strain is -22.1  %.   2. Right ventricular systolic function is normal. The right ventricular  size is normal. Tricuspid regurgitation signal is inadequate for assessing  PA pressure.   3. Left atrial size was severely dilated.   4. The mitral valve is degenerative. No evidence of mitral valve  regurgitation. Mild mitral stenosis. The mean mitral valve gradient is 4.0  mmHg with average heart rate of 68 bpm. Moderate mitral annular  calcification.   5. The inferior vena cava is normal in size with greater than 50%  respiratory variability, suggesting right atrial  pressure of 3 mmHg.   6. The aortic valve is calcified. There is severe calcifcation of the  aortic valve. Aortic valve regurgitation is mild. Severe aortic valve  stenosis. Vmax 4.3 m/s, MG 45 mmHg, AVA 0.9 cm^2, DI 0.25   FINDINGS   Left Ventricle: Left ventricular ejection fraction, by estimation, is  70  to 75%. The left ventricle has hyperdynamic function. The left ventricle  has no regional wall motion abnormalities. The average left ventricular  global longitudinal strain is -22.1   %. The left ventricular internal cavity size was normal in size. There is  severe asymmetric left ventricular hypertrophy of the basal-septal  segment. Left ventricular diastolic parameters are consistent with Grade  II diastolic dysfunction  (pseudonormalization). Elevated left atrial pressure.   Right Ventricle: The right ventricular size is normal. No increase in  right ventricular wall thickness. Right ventricular systolic function is  normal. Tricuspid regurgitation signal is inadequate for assessing PA  pressure.   Left Atrium: Left atrial size was severely dilated.   Right Atrium: Right atrial size was normal in size.   Pericardium: There is no evidence of pericardial effusion.   Mitral Valve: The mitral valve is degenerative in appearance. Moderate  mitral annular calcification. No evidence of mitral valve regurgitation.  Mild mitral valve stenosis. The mean mitral valve gradient is 4.0 mmHg  with average heart rate of 68 bpm.   Tricuspid Valve: The tricuspid valve is normal in structure. Tricuspid  valve regurgitation is trivial.   Aortic Valve: The aortic valve is calcified. There is severe calcifcation  of the aortic valve. Aortic valve regurgitation is mild. Aortic  regurgitation PHT measures 557 msec. Severe aortic stenosis is present.  Aortic valve mean gradient measures 37.6  mmHg. Aortic valve peak gradient measures 66.6 mmHg. Aortic valve area, by  VTI measures 0.97 cm.    Pulmonic Valve: The pulmonic valve was not well visualized. Pulmonic valve  regurgitation is trivial.   Aorta: The aortic root and ascending aorta are structurally normal, with  no evidence of dilitation.   Venous: The inferior vena cava is normal in size with greater than 50%  respiratory variability, suggesting right atrial pressure of 3 mmHg.   IAS/Shunts: The interatrial septum was not well visualized.     LEFT VENTRICLE  PLAX 2D  LVIDd:         3.90 cm   Diastology  LVIDs:         1.40 cm   LV e' medial:    4.79 cm/s  LV PW:         0.90 cm   LV E/e' medial:  18.9  LV IVS:        1.60 cm   LV e' lateral:   4.60 cm/s  LVOT diam:     2.10 cm   LV E/e' lateral: 19.7  LV SV:         80  LV SV Index:   46        2D Longitudinal Strain  LVOT Area:     3.46 cm  2D Strain GLS (A2C):   -22.4 %                           2D Strain GLS (A3C):   -19.3 %                           2D Strain GLS (A4C):   -24.7 %                           2D Strain GLS Avg:     -22.1 %   RIGHT VENTRICLE  RV Basal diam:  3.20 cm  RV S prime:  20.70 cm/s  TAPSE (M-mode): 2.7 cm   LEFT ATRIUM              Index        RIGHT ATRIUM           Index  LA diam:        4.80 cm  2.76 cm/m   RA Area:     18.70 cm  LA Vol (A2C):   109.0 ml 62.76 ml/m  RA Volume:   48.80 ml  28.10 ml/m  LA Vol (A4C):   78.8 ml  45.37 ml/m  LA Biplane Vol: 94.9 ml  54.64 ml/m   AORTIC VALVE  AV Area (Vmax):    0.88 cm  AV Area (Vmean):   0.83 cm  AV Area (VTI):     0.97 cm  AV Vmax:           408.00 cm/s  AV Vmean:          284.200 cm/s  AV VTI:            0.819 m  AV Peak Grad:      66.6 mmHg  AV Mean Grad:      37.6 mmHg  LVOT Vmax:         103.50 cm/s  LVOT Vmean:        68.400 cm/s  LVOT VTI:          0.230 m  LVOT/AV VTI ratio: 0.28  AI PHT:            557 msec    AORTA  Ao Root diam: 3.30 cm  Ao Asc diam:  3.50 cm   MITRAL VALVE  MV Area (PHT): cm          SHUNTS  MV Mean grad:  4.0 mmHg      Systemic VTI:  0.23 m  MV Decel Time: 256 msec     Systemic Diam: 2.10 cm  MV E velocity: 90.70 cm/s  MV A velocity: 172.00 cm/s  MV E/A ratio:  0.53   Oswaldo Milian MD  Electronically signed by Oswaldo Milian MD  Signature Date/Time: 05/04/2022/2:39:47 PM        Final     Physicians  Panel Physicians Referring Physician Case Authorizing Physician  Burnell Blanks, MD (Primary)     Procedures  RIGHT HEART CATH AND CORONARY ANGIOGRAPHY   Conclusion      Ost RCA to Prox RCA lesion is 20% stenosed.   Prox LAD lesion is 20% stenosed.   Mild non-obstructive CAD Normal right heart pressures. (PCWP 13 mmHg)   Recommendations: Will continue workup for TAVR.    Indications  Severe aortic stenosis [I35.0 (ICD-10-CM)]   Procedural Details  Technical Details Indication: 81 yo female with severe AS, workup for TAVR  Procedure: The risks, benefits, complications, treatment options, and expected outcomes were discussed with the patient. The patient and/or family concurred with the proposed plan, giving informed consent. The patient was sedated with Versed and Fentanyl. The IV catheter present in the right antecubital vein was changed for a 5 [place] sheath. Right heart catheterization performed with a balloon tipped catheter. The right wrist was prepped and draped in a sterile fashion. 1% lidocaine was used for local anesthesia. Using the modified Seldinger access technique, a 5 French sheath was placed in the right radial artery. 3 mg Verapamil was given through the sheath. Weight based IV heparin was given. Standard diagnostic catheters were used to perform selective coronary  angiography. I did not cross the aortic valve. All catheter exchanges were performed over an exchange length guidewire.   The sheath was removed from the right radial artery and a hemostasis band was applied at the arteriotomy site on the right wrist.      Estimated blood loss <50 mL.    During this procedure medications were administered to achieve and maintain moderate conscious sedation while the patient's heart rate, blood pressure, and oxygen saturation were continuously monitored and I was present face-to-face 100% of this time.   Medications (Filter: Administrations occurring from 1325 to 1423 on 05/17/22) fentaNYL (SUBLIMAZE) injection (mcg)  Total dose: 25 mcg Date/Time Rate/Dose/Volume Action   05/17/22 1344 25 mcg Given   midazolam (VERSED) injection (mg)  Total dose: 1 mg Date/Time Rate/Dose/Volume Action   05/17/22 1345 1 mg Given   lidocaine (PF) (XYLOCAINE) 1 % injection (mL)  Total volume: 4 mL Date/Time Rate/Dose/Volume Action   05/17/22 1357 2 mL Given   1359 2 mL Given   heparin sodium (porcine) injection (Units)  Total dose: 4,000 Units Date/Time Rate/Dose/Volume Action   05/17/22 1407 4,000 Units Given   Heparin (Porcine) in NaCl 1000-0.9 UT/500ML-% SOLN (mL)  Total volume: 1,000 mL Date/Time Rate/Dose/Volume Action   05/17/22 1416 500 mL Given   1417 500 mL Given   iohexol (OMNIPAQUE) 350 MG/ML injection (mL)  Total volume: 45 mL Date/Time Rate/Dose/Volume Action   05/17/22 1421 45 mL Given   Radial Cocktail/Verapamil only (mL)  Total volume: 10 mL Date/Time Rate/Dose/Volume Action   05/17/22 1359 10 mL Given    Sedation Time  Sedation Time Physician-1: 28 minutes 8 seconds Contrast  Medication Name Total Dose  iohexol (OMNIPAQUE) 350 MG/ML injection 45 mL   Radiation/Fluoro  Fluoro time: 4.9 (min) DAP: 12459 (mGycm2) Cumulative Air Kerma: 662 (mGy) Complications  Complications documented before study signed (05/17/2022  9:47 PM)   No complications were associated with this study.  Documented by Bethann Punches, RN - 05/17/2022  2:21 PM     Coronary Findings  Diagnostic Dominance: Right Left Anterior Descending  Vessel is large.  Prox LAD lesion is 20% stenosed.    Left Circumflex  Vessel is large.    Right  Coronary Artery  Vessel is large.  Ost RCA to Prox RCA lesion is 20% stenosed.    Intervention   No interventions have been documented.   Coronary Diagrams  Diagnostic Dominance: Right  Intervention   Implants   No implant documentation for this case.   [facility]ng[employer] Images   Show images for CARDIAC CATHETERIZATION Images on Long Term Storage   Show images for [place], [place] to Procedure Log  Procedure Log    Hemo Data  Flowsheet Row Most Recent Value  Fick Cardiac Output 6.47 L/min  Fick Cardiac Output Index 3.73 (L/min)/BSA  RA A Wave 8 mmHg  RA V Wave 6 mmHg  RA Mean 4 mmHg  RV Systolic Pressure 31 mmHg  RV Diastolic Pressure 8 mmHg  RV EDP 10 mmHg  PA Systolic Pressure 29 mmHg  PA Diastolic Pressure 18 mmHg  PA Mean 25 mmHg  PW A Wave 16 mmHg  PW V Wave 19 mmHg  PW Mean 13 mmHg  AO Systolic Pressure 654 mmHg  AO Diastolic Pressure 67 mmHg  AO Mean 97 mmHg  QP/QS 1  TPVR Index 6.7 HRUI  TSVR Index 25.99 HRUI  PVR SVR Ratio 0.13  TPVR/TSVR Ratio 0.26    ADDENDUM REPORT: 06/05/2022 10:38  CLINICAL DATA:  Severe Aortic Stenosis.   EXAM: Cardiac TAVR CT   TECHNIQUE: A non-contrast, gated CT scan was obtained with axial slices of 3 mm through the heart for aortic valve calcium scoring. A 120 kV retrospective, gated, contrast cardiac scan was obtained. Gantry rotation speed was 250 msecs and collimation was 0.6 mm. Nitroglycerin was not given. The 3D data set was reconstructed in 5% intervals of the 0-95% of the R-R cycle. Systolic and diastolic phases were analyzed on a dedicated workstation using MPR, MIP, and VRT modes. The patient received 100 cc of contrast.   FINDINGS: Image quality: Excellent.   Noise artifact is: Limited.   Valve Morphology: Tricuspid aortic valve with severe diffuse calcifications. Severely restricted leaflet motion in systole.   Aortic Valve Calcium score: 2026   Aortic annular dimension:   Phase assessed:  25%   Annular area: 415 mm2   Annular perimeter: 73.6 mm   Max diameter: 25.2 mm   Min diameter: 22.3 mm   Annular and subannular calcification: None.   Membranous septum length: 8.2 mm   Optimal coplanar projection: LAO 19 CRA 1   Coronary Artery Height above Annulus:   Left Main: 16.7 mm   Right Coronary: 17.4 mm   Sinus of Valsalva Measurements:   Non-coronary: 31 mm   Right-coronary: 31 mm   Left-coronary: 32 mm   Sinus of Valsalva Height:   Non-coronary: 24.6 mm   Right-coronary: 21.5 mm   Left-coronary: 22.7 mm   Sinotubular Junction: 30 mm   Ascending Thoracic Aorta: Mild dilation of the ascending aorta, 40 mm.   Coronary Arteries: Normal coronary origin. Right dominance. The study was performed without use of NTG and is insufficient for plaque evaluation. Please refer to recent cardiac catheterization for coronary assessment.   Cardiac Morphology:   Right Atrium: Right atrial size is within normal limits.   Right Ventricle: The right ventricular cavity is within normal limits.   Left Atrium: Left atrial size is normal in size with no left atrial appendage filling defect. 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   Left Ventricle: The ventricular cavity size is within normal limits. Severe asymmetric hypertrophy of the basal septum up to 20 mm. Small diverticulum of the ventricular apex.   Pulmonary arteries: Normal in size without proximal filling defect.   Pulmonary veins: Normal pulmonary venous drainage.   Pericardium: Normal thickness with no significant effusion or calcium present.   Mitral Valve: The mitral valve is normal structure without significant calcification.   Extra-cardiac findings: See attached radiology report for non-cardiac structures.   IMPRESSION: 1. Annular measurements support a 23 mm S3 or 29 mm Evolut Pro.   2. No significant annular or subannular calcifications.   3. Sufficient coronary to annulus distance.   4.  Optimal Fluoroscopic Angle for Delivery: LAO 19 CRA 1   5. Mild dilation of the ascending aorta up to 40 mm.   6. Severe asymmetric hypertrophy of the basal septum up to 20 mm.   7. Small diverticulum of the ventricular apex.   8. 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   Lake Bells T. Audie Box, MD     Electronically Signed   By: Eleonore Chiquito M.D.   On: 06/05/2022 10:38    Addended by Geralynn Rile, MD on 06/05/2022 10:41 AM    Study Result  Narrative & Impression  EXAM: OVER-READ INTERPRETATION  CT CHEST   The following report is a limited chest CT over-read performed by  radiologist Dr. Salvatore Marvel of [facility], [place] on 06/04/2022. This over-read does not include interpretation of cardiac or coronary anatomy or pathology. The cardiac CTA interpretation by the cardiologist is attached.   COMPARISON:  None Available.   FINDINGS: Please see the separate concurrent chest CT angiogram report for details.   IMPRESSION: Please see the separate concurrent chest CT angiogram report for details.   Electronically Signed: By: Ilona Sorrel M.D. On: 06/04/2022 11:42       Narrative & Impression  CLINICAL DATA:  Aortic valve replacement (TAVR), pre-op eval. Non rheumatic aortic valve stenosis. History of left breast cancer. * Tracking Code: BO *   EXAM: CT ANGIOGRAPHY CHEST, ABDOMEN AND PELVIS   TECHNIQUE: Multidetector CT imaging through the chest, abdomen and pelvis was performed using the standard protocol during bolus administration of intravenous contrast. Multiplanar reconstructed images and MIPs were obtained and reviewed to evaluate the vascular anatomy.   RADIATION DOSE REDUCTION: This exam was performed according to the departmental dose-optimization program which includes automated exposure control, adjustment of the mA and/or kV according to patient size and/or use of iterative reconstruction technique.   CONTRAST:  26m OMNIPAQUE IOHEXOL 350 MG/ML  SOLN   COMPARISON:  None Available.   FINDINGS: CTA CHEST FINDINGS   Cardiovascular: Mild cardiomegaly. Diffuse thickening and coarse calcification of the aortic valve. No significant pericardial effusion/thickening. Atherosclerotic thoracic aorta with dilated 4.1 cm ascending thoracic aorta. Normal caliber pulmonary arteries. No central pulmonary emboli.   Mediastinum/Nodes: Hypodense bilateral thyroid nodules, largest 1.7 cm on the right. Unremarkable esophagus. Surgical clips in the left axilla. No axillary adenopathy.   Lungs/Pleura: No pneumothorax. No pleural effusion. Solid 0.5 cm medial basilar right lower lobe pulmonary nodule (series 5/image 87). Solid 0.5 cm posterior left upper lobe pulmonary nodule (series 5/image 69). No acute consolidative airspace disease.   Musculoskeletal: No aggressive appearing focal osseous lesions. Moderate thoracic spondylosis. Surgical clips in the left breast. Asymmetric left breast skin thickening.   CTA ABDOMEN AND PELVIS FINDINGS   Hepatobiliary: Normal liver with no liver mass. Normal gallbladder with no radiopaque cholelithiasis. No biliary ductal dilatation.   Pancreas: Normal, with no mass or duct dilation.   Spleen: Normal size. No mass.   Adrenals/Urinary Tract: Normal adrenals. No contour deforming renal masses. No hydronephrosis. Scattered subcentimeter hypodense bilateral renal cortical lesions are too small to characterize, for which no follow-up imaging is recommended. Collapsed bladder is obscured by streak artifact from left hip hardware and grossly normal.   Stomach/Bowel: Small hiatal hernia. Otherwise normal nondistended stomach. Normal caliber small bowel with no small bowel wall thickening. Appendix not discretely visualized. Normal large bowel with no diverticulosis, large bowel wall thickening or pericolonic fat stranding.   Vascular/Lymphatic: Atherosclerotic nonaneurysmal abdominal aorta. Patent  portal, splenic and renal veins. No pathologically enlarged lymph nodes in the abdomen or pelvis.   Reproductive: Status post hysterectomy, with no abnormal findings at the vaginal cuff. No adnexal mass.   Other: No pneumoperitoneum, ascites or focal fluid collection. Large fat density 14.3 x 6.2 x 16.5 cm right gluteal intramuscular mass (series 4/image 185).   Musculoskeletal: No aggressive appearing focal osseous lesions. Left total hip arthroplasty. Marked lumbar degenerative disc disease. Bilateral posterior spinal fusion at L4-5.   VASCULAR MEASUREMENTS PERTINENT TO TAVR:   AORTA:   Minimal Aortic Diameter-14.7 x 13.3 mm   Severity of Aortic Calcification-moderate   RIGHT PELVIS:   Right Common Iliac Artery -   Minimal Diameter-10.2 x 9.2 mm  Tortuosity-mild   Calcification-mild   Right External Iliac Artery -   Minimal Diameter-7.6 x 7.3 mm   Tortuosity-mild   Calcification-none   Right Common Femoral Artery -   Minimal Diameter-8.7 x 7.3 mm   Tortuosity-mild   Calcification-mild   LEFT PELVIS:   Left Common Iliac Artery -   Minimal Diameter-10.9 x 9.3 mm   Tortuosity-mild   Calcification-mild   Left External Iliac Artery -   Minimal Diameter-7.8 x 7.4 mm   Tortuosity-mild   Calcification-none   Left Common Femoral Artery -   Minimal Diameter-8.0 x 7.2 mm   Tortuosity-mild   Calcification-mild   Review of the MIP images confirms the above findings.   IMPRESSION: 1. Vascular findings and measurements pertinent to potential TAVR procedure, as detailed. 2. Diffuse thickening and coarse calcification of the aortic valve, compatible with the reported history of aortic stenosis. 3. Mild cardiomegaly. 4. Two solid pulmonary nodules, largest 0.5 cm. Recommend attention on follow-up chest CT in 3 months given the history of malignancy and absence of prior scans. 5. Hypodense bilateral thyroid nodules, largest 1.7 cm on the  right. Recommend thyroid US (ref: J Am Coll Radiol. 2015 Feb;12(2): 143-50). 6. 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 Recommend annual imaging followup by CTA or MRA. This recommendation follows 2010 ACCF/AHA/AATS/ACR/ASA/SCA/SCAI/SIR/STS/SVM Guidelines for the Diagnosis and Management of Patients with Thoracic Aortic Disease. Circulation. 2010; 121: J696-V893. Aortic aneurysm NOS (ICD10-I71.9). 7. Large fat density 14.3 x 6.2 x 16.5 cm right gluteal intramuscular mass, most compatible with a lipoma. 8. Small hiatal hernia. 9.  Aortic Atherosclerosis (ICD10-I70.0).     Electronically Signed   By: Ilona Sorrel M.D.   On: 06/04/2022 12:18      Impression:  This 81 year old woman has stage D, severe, symptomatic aortic stenosis with NYHA class II symptoms of exertional fatigue and shortness of breath consistent with chronic diastolic congestive heart failure.  I personally reviewed her 2D echocardiogram, cardiac catheterization, and CTA studies.  Her echocardiogram shows a severely calcified trileaflet aortic valve with restricted leaflet mobility.  The mean gradient is 38 mmHg with a peak gradient of 67 mmHg and a valve area of 0.97 cm consistent with severe aortic stenosis.  There is mild aortic insufficiency.  Left ventricular ejection fraction is normal.  Cardiac catheterization showed mild nonobstructive coronary disease with normal right heart pressures.  I agree that aortic valve replacement is indicated in this patient for relief of her symptoms and to prevent left ventricular dysfunction.  Given her advanced age and comorbidities I think that transcatheter aortic valve replacement would be the best treatment option for her.  Her gated cardiac CTA shows anatomy suitable for TAVR using a 23 mm Edwards SAPIEN 3 valve.  There is significant asymmetric hypertrophy of the basal septum but I do not think that will cause a problem for implanting a SAPIEN 3 valve.  Her abdominal and pelvic  CTA shows adequate pelvic vascular anatomy to allow transfemoral insertion.  Electrocardiogram shows sinus rhythm with incomplete right bundle branch block and left anterior fascicular block which will increase her risk of needing a permanent pacemaker postoperatively.  The patient and her friend were counseled at length regarding treatment alternatives for management of severe symptomatic aortic stenosis. The risks and benefits of surgical intervention has been discussed in detail. Long-term prognosis with medical therapy was discussed. Alternative approaches such as conventional surgical aortic valve replacement, transcatheter aortic valve replacement, and palliative medical therapy were compared and contrasted at length. This  discussion was placed in the context of the patient's own specific clinical presentation and past medical history. All of their questions have been addressed.   Following the decision to proceed with transcatheter aortic valve replacement, a discussion was held regarding what types of management strategies would be attempted intraoperatively in the event of life-threatening complications, including whether or not the patient would be considered a candidate for the use of cardiopulmonary bypass and/or conversion to open sternotomy for attempted surgical intervention.  Given her advanced age and comorbidities I do not think she is a candidate for emergent sternotomy to manage any intraoperative complications.  The patient is aware of the fact that transient use of cardiopulmonary bypass may be necessary. The patient has been advised of a variety of complications that might develop including but not limited to risks of death, stroke, paravalvular leak, aortic dissection or other major vascular complications, aortic annulus rupture, device embolization, cardiac rupture or perforation, mitral regurgitation, acute myocardial infarction, arrhythmia, heart block or bradycardia requiring  permanent pacemaker placement, congestive heart failure, respiratory failure, renal failure, pneumonia, infection, other late complications related to structural valve deterioration or migration, or other complications that might ultimately cause a temporary or permanent loss of functional independence or other long term morbidity. The patient provides full informed consent for the procedure as described and all questions were answered.      Plan:  She will be scheduled for transfemoral TAVR using a SAPIEN 3 valve on Tuesday, 06/26/2022.  I spent 60 minutes performing this consultation and > 50% of this time was spent face to face counseling and coordinating the care of this patient's severe symptomatic aortic stenosis.   [name], MD 06/06/2022

## 2022-06-07 ENCOUNTER — Encounter: Payer: Self-pay | Admitting: Physician Assistant

## 2022-06-12 ENCOUNTER — Other Ambulatory Visit: Payer: Self-pay | Admitting: Physician Assistant

## 2022-06-12 ENCOUNTER — Encounter: Payer: Self-pay | Admitting: Physician Assistant

## 2022-06-12 DIAGNOSIS — I350 Nonrheumatic aortic (valve) stenosis: Secondary | ICD-10-CM

## 2022-06-22 ENCOUNTER — Ambulatory Visit (HOSPITAL_COMMUNITY)
Admission: RE | Admit: 2022-06-22 | Discharge: 2022-06-22 | Disposition: A | Discharge status: Home / Self Care | Payer: Medicare HMO | Source: Ambulatory Visit | Attending: Physician Assistant | Admitting: Physician Assistant

## 2022-06-22 ENCOUNTER — Encounter (HOSPITAL_COMMUNITY)
Admission: RE | Admit: 2022-06-22 | Discharge: 2022-06-22 | Disposition: A | Discharge status: Home / Self Care | Payer: Medicare HMO | Source: Ambulatory Visit | Attending: Cardiovascular Disease | Admitting: Cardiovascular Disease

## 2022-06-22 ENCOUNTER — Other Ambulatory Visit: Payer: Self-pay

## 2022-06-22 DIAGNOSIS — Z1152 Encounter for screening for COVID-19: Secondary | ICD-10-CM | POA: Insufficient documentation

## 2022-06-22 DIAGNOSIS — Z01818 Encounter for other preprocedural examination: Secondary | ICD-10-CM | POA: Insufficient documentation

## 2022-06-22 DIAGNOSIS — I350 Nonrheumatic aortic (valve) stenosis: Secondary | ICD-10-CM | POA: Insufficient documentation

## 2022-06-22 LAB — CBC
HCT: 41.2 % (ref 36.0–46.0)
Hemoglobin: 13.7 g/dL (ref 12.0–15.0)
MCH: 30.6 pg (ref 26.0–34.0)
MCHC: 33.3 g/dL (ref 30.0–36.0)
MCV: 92.2 fL (ref 80.0–100.0)
Platelets: 321 10*3/uL (ref 150–400)
RBC: 4.47 MIL/uL (ref 3.87–5.11)
RDW: 14 % (ref 11.5–15.5)
WBC: 10.3 10*3/uL (ref 4.0–10.5)
nRBC: 0 % (ref 0.0–0.2)

## 2022-06-22 LAB — TYPE AND SCREEN
ABO/RH(D): B POS
Antibody Screen: NEGATIVE

## 2022-06-22 LAB — URINALYSIS, ROUTINE W REFLEX MICROSCOPIC
Bilirubin Urine: NEGATIVE
Glucose, UA: NEGATIVE mg/dL
Hgb urine dipstick: NEGATIVE
Ketones, ur: NEGATIVE mg/dL
Nitrite: POSITIVE — AB
Protein, ur: 30 mg/dL — AB
Specific Gravity, Urine: 1.018 (ref 1.005–1.030)
WBC, UA: >50 WBC/hpf — ABNORMAL HIGH (ref 0–5)
pH: 5 (ref 5.0–8.0)

## 2022-06-22 LAB — COMPREHENSIVE METABOLIC PANEL
ALT: 16 U/L (ref 0–44)
AST: 25 U/L (ref 15–41)
Albumin: 3.7 g/dL (ref 3.5–5.0)
Alkaline Phosphatase: 47 U/L (ref 38–126)
Anion gap: 12 (ref 5–15)
BUN: 12 mg/dL (ref 8–23)
CO2: 22 mmol/L (ref 22–32)
Calcium: 8.8 mg/dL — ABNORMAL LOW (ref 8.9–10.3)
Chloride: 106 mmol/L (ref 98–111)
Creatinine, Ser: 0.83 mg/dL (ref 0.44–1.00)
GFR, Estimated: >60 mL/min (ref >60)
Glucose, Bld: 198 mg/dL — ABNORMAL HIGH (ref 70–99)
Potassium: 3.9 mmol/L (ref 3.5–5.1)
Sodium: 140 mmol/L (ref 135–145)
Total Bilirubin: 0.4 mg/dL (ref 0.3–1.2)
Total Protein: 7.1 g/dL (ref 6.5–8.1)

## 2022-06-22 LAB — PROTIME-INR
INR: 1 (ref 0.8–1.2)
Prothrombin Time: 12.8 seconds (ref 11.4–15.2)

## 2022-06-22 LAB — SURGICAL PCR SCREEN
MRSA, PCR: POSITIVE — AB
Staphylococcus aureus: POSITIVE — AB

## 2022-06-22 MED ORDER — SULFAMETHOXAZOLE-TRIMETHOPRIM 800-160 MG PO TABS: 1.0000 | ORAL_TABLET | Freq: Two times a day (BID) | ORAL | 0 refills | Status: DC

## 2022-06-22 NOTE — Progress Notes (Addendum)
Letter with instructions given to patient as well as CHG soap. CHG soap instructions reviewed and patient questions answered.   Addendum: TAVR RN aware of resulted labs--UA, and +MRSA nasal swab. CMET still pending as chemistry lab is currently down.   Per Lauren TAVR RN: Pt will be started on a 3 day course of Bactrim DS. She denies any symptoms of a UTI

## 2022-06-23 LAB — SARS CORONAVIRUS 2 (TAT 6-24 HRS): SARS Coronavirus 2: NEGATIVE

## 2022-06-25 MED ORDER — VANCOMYCIN HCL IN DEXTROSE 1-5 GM/200ML-% IV SOLN
1000.0000 mg | INTRAVENOUS | Status: AC
  Administered 2022-06-26: 1000 mg via INTRAVENOUS
  Filled 2022-06-25: qty 200

## 2022-06-25 MED ORDER — POTASSIUM CHLORIDE 2 MEQ/ML IV SOLN
80.0000 meq | INTRAVENOUS | Status: DC
  Filled 2022-06-25 (×2): qty 40

## 2022-06-25 MED ORDER — HEPARIN 30,000 UNITS/1000 ML (OHS) CELLSAVER SOLUTION
Status: DC
  Filled 2022-06-25 (×2): qty 1000

## 2022-06-25 MED ORDER — MAGNESIUM SULFATE 50 % IJ SOLN
40.0000 meq | INTRAMUSCULAR | Status: DC
  Filled 2022-06-25 (×2): qty 9.85

## 2022-06-25 MED ORDER — NOREPINEPHRINE 4 MG/250ML-% IV SOLN
0.0000 ug/min | INTRAVENOUS | Status: AC
  Administered 2022-06-26: 2 ug/min via INTRAVENOUS
  Filled 2022-06-25 (×2): qty 250

## 2022-06-25 MED ORDER — CEFAZOLIN SODIUM-DEXTROSE 2-4 GM/100ML-% IV SOLN
2.0000 g | INTRAVENOUS | Status: AC
  Administered 2022-06-26: 2 g via INTRAVENOUS
  Filled 2022-06-25: qty 100

## 2022-06-25 MED ORDER — DEXMEDETOMIDINE HCL IN NACL 400 MCG/100ML IV SOLN
0.1000 ug/kg/h | INTRAVENOUS | Status: AC
  Administered 2022-06-26: 73.52 ug via INTRAVENOUS
  Filled 2022-06-25: qty 100

## 2022-06-25 NOTE — Progress Notes (Signed)
Pt's voicemail is full. I spoke with her son Doreene Eland, pt will arrive tom at [facility].

## 2022-06-25 NOTE — H&P (Signed)
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  She has a history of moderate aortic stenosis followed by Dr. Acie Fredrickson for several years.  An echocardiogram in September 2021 had shown a severely calcified and restricted aortic valve with a mean gradient of 33 mmHg and a valve area of 0.8 cm with a dimensionless index of 0.23.  It was felt that this may be paradoxical low-flow/low gradient severe aortic stenosis and she was seen by Dr. Angelena Form at that time.  She was felt to have moderate aortic stenosis and was asymptomatic and decision was made to continue following her.  She subsequently had follow-up echocardiograms in March and October 2022 and both showed aortic valve mean gradients in the 25-28 range.  An echo done on 10/27/2021 showed an aortic valve mean gradient of 27 mmHg which was stable.  Her most recent echo on 05/04/2022 showed an increase in the mean gradient to 37.6 mmHg with a peak gradient of 67 mmHg and a valve area of 0.97 cm.  There is mild aortic insufficiency.  The aortic valve remains severely calcified and restricted.  Left ventricular ejection fraction was 70 to 75%.  She recently presented to the heart valve clinic reporting a recent fall after slipping in the grass and has not felt well since.  She reports occasional  episodes of chest discomfort that are mild.  She has noted exertional fatigue and shortness of breath.  She has had some episodes of dizziness but no syncope.  She denies orthopnea but has some peripheral edema.   She lives alone and has a Forensic scientist.  She still walks using a cane but is not very active.       Past Medical History:  Diagnosis Date   Anemia     Anxiety     Aortic stenosis      severe by 2021 echo    Arthritis     Bilateral carotid artery disease ([place]) 02/04/2017    Carotid US 3/22: R 1-39; L 100   Depression, recurrent ([place]) 02/10/2019   Hypertension     Hypothyroidism     Impaired functional mobility, balance, gait, and endurance 09/02/2015   Malignant neoplasm of lower-outer quadrant of left breast of female, estrogen receptor positive ([place]) 04/24/2021   Mild cognitive impairment with memory loss 09/02/2015   Osteopenia     Pre-diabetes     Stroke ([place])      pt was 40           Past Surgical History:  Procedure Laterality Date   ABDOMINAL HYSTERECTOMY       BACK SURGERY        2019   BRAIN SURGERY        age 57's   BREAST LUMPECTOMY WITH RADIOACTIVE SEED AND SENTINEL LYMPH  NODE BIOPSY Left 05/17/2021    Procedure: LEFT BREAST SEED LOCALIZED LUMPECTOMY (BRACKETED) WITH SENTINEL LYMPH NODE BIOPSY;  Surgeon: Erroll Luna, MD;  Location: [facility];  Service: General;  Laterality: Left;  PEC BLOCK 90 MINUTES ROOM 9   RE-EXCISION OF BREAST LUMPECTOMY Left 06/08/2021    Procedure: RE-EXCISION OF LEFT BREAST LUMPECTOMY;  Surgeon: Erroll Luna, MD;  Location: [facility];  Service: General;  Laterality: Left;   RIGHT HEART CATH AND CORONARY ANGIOGRAPHY N/A 05/17/2022    Procedure: RIGHT HEART CATH AND CORONARY ANGIOGRAPHY;  Surgeon: Burnell Blanks, MD;  Location: [facility];  Service: Cardiovascular;  Laterality: N/A;   TOE AMPUTATION   2013    2nd toe on right foot, hammer toe   TONSILLECTOMY       TOTAL HIP ARTHROPLASTY Left 03/19/2017     Procedure: LEFT TOTAL HIP ARTHROPLASTY ANTERIOR APPROACH;  Surgeon: Mcarthur Rossetti, MD;  Location: [facility];  Service: Orthopedics;  Laterality: Left;           Family History  Problem Relation Age of Onset   Thyroid disease Mother     Heart disease Father        Social History         Socioeconomic History   Marital status: Widowed      Spouse name: Not on file   Number of children: 2   Years of education: masters   Highest education level: Not on file  Occupational History   Occupation: Scientist, product/process development: RETIRED  Tobacco Use   Smoking status: Never   Smokeless tobacco: Never  Vaping Use   Vaping Use: Never used  Substance and Sexual Activity   Alcohol use: Not Currently      Alcohol/week: 1.0 standard drink of alcohol      Types: 1 Glasses of wine per week   Drug use: No   Sexual activity: Not Currently      Birth control/protection: Post-menopausal  Other Topics Concern   Not on file  Social History Narrative    Health Care POA:     Emergency Contact: son, Doreene Eland, (c) [phone]    End of Life Plan:     Who lives with you: lives alone now     Any pets: 2 cats, Sadie and Katie    Diet: Pt has a varied diet and tries to limit sweets.     Exercise: Pt walks on treadmill 10 minutes a day and uses bike 10 minutes a day.    Seatbelts: Pt reports wearing seatbelt when in vehicles.     Sun Exposure/Protection: Pt does not use sun protection.    Hobbies: reading, watching movies, writing         Husband passed away about 10 years ago     Social Determinants of Health        Financial Resource Strain: Low Risk  (06/23/2021)    Overall Financial Resource Strain (CARDIA)     Difficulty of Paying Living Expenses: Not very hard  Food Insecurity: No Food Insecurity (06/07/2018)    Hunger Vital Sign     Worried About Running Out of Food in the Last Year: Never true     Ran Out of Food in the Last Year: Never true  Transportation Needs: No  Transportation Needs (06/23/2021)    PRAPARE - Armed forces logistics/support/administrative officer (Medical): No     Lack of Transportation (Non-Medical): No  Physical  Activity: Unknown (06/07/2018)    Exercise Vital Sign     Days of Exercise per Week: Patient refused     Minutes of Exercise per Session: Patient refused  Stress: No Stress Concern Present (06/23/2021)    [place]     Feeling of Stress : Only a little  Social Connections: Moderately Integrated (06/23/2021)    Social Connection and Isolation Panel [NHANES]     Frequency of Communication with Friends and Family: More than three times a week     Frequency of Social Gatherings with Friends and Family: Once a week     Attends Religious Services: More than 4 times per year     Active Member of [employer] or Organizations: Yes     Attends Archivist Meetings: Never     Marital Status: Widowed  Intimate Partner Violence: Not At Risk (06/07/2018)    Humiliation, Afraid, Rape, and Kick questionnaire     Fear of Current or Ex-Partner: No     Emotionally Abused: No     Physically Abused: No     Sexually Abused: No             Prior to Admission medications   Medication Sig Start Date End Date Taking? Authorizing Provider  amitriptyline (ELAVIL) 50 MG tablet Take 1 tablet (50 mg total) by mouth at bedtime. 04/05/22   Yes Martyn Malay, MD  amLODipine (NORVASC) 5 MG tablet Take 1 tablet (5 mg total) by mouth at bedtime. 04/30/22   Yes Martyn Malay, MD  aspirin 81 MG chewable tablet Chew 1 tablet (81 mg total) by mouth daily. Patient taking differently: Chew 81 mg by mouth every evening. 12/23/19   Yes Martyn Malay, MD  atorvastatin (LIPITOR) 20 MG tablet Take 1 tablet (20 mg total) by mouth daily. 08/08/21   Yes Martyn Malay, MD  benazepril (LOTENSIN) 40 MG tablet Take 1 tablet (40 mg total) by mouth daily. 09/27/21   Yes Martyn Malay, MD  gabapentin (NEURONTIN)  100 MG capsule 1 tablet in the morning and 2 at night 08/08/21   Yes Martyn Malay, MD  hydrochlorothiazide (MICROZIDE) 12.5 MG capsule Take 1 capsule (12.5 mg total) by mouth daily. 04/30/22   Yes Martyn Malay, MD  levothyroxine (SYNTHROID) 88 MCG tablet Take 1 tablet (88 mcg total) by mouth every morning. 30 minutes before food 04/30/22   Yes Martyn Malay, MD  Misc Natural Products (OSTEO BI-FLEX ADV TRIPLE ST) TABS Take 1 tablet by mouth in the morning. '60mg'$  of Vitamin C  (Ascorbic Acid); '2mg'$  Manganese (Manganese Sulfate); '35mg'$  Sodium; '1500mg'$  Glucosamine HCI; '100mg'$  5-Loxin (Boswellia Serrata Extract resin); '1103mg'$  (Chondroitin/MSM Complex (Chrondroitin Sulfate, Methylsulfonylmethane, Collagen (Hydrolyzed Gelatin), Boswellia Serrata (resin); Boron (Bororganic Glycine); Hyaluronic Acid     Yes [provider]  Multiple Vitamins-Minerals (CENTRUM SILVER ADULT 50+) TABS Take 1 tablet by mouth in the morning.     Yes [provider]  phenylephrine (NEO-SYNEPHRINE) 1 % nasal spray Place 1 drop into both nostrils 3 (three) times daily as needed for congestion.     Yes [provider]  tamoxifen (NOLVADEX) 20 MG tablet TAKE 1 TABLET EVERY DAY 04/19/22   Yes Truitt Merle, MD  venlafaxine XR (EFFEXOR-XR) 37.5 MG 24 hr capsule TAKE 1 CAPSULE EVERY DAY WITH BREAKFAST 04/23/22   Yes Truitt Merle, MD            Current Outpatient Medications  Medication Sig Dispense Refill   amitriptyline (ELAVIL) 50 MG tablet Take 1 tablet (50 mg total) by mouth at bedtime. 90 tablet 3   amLODipine (NORVASC) 5 MG tablet Take 1 tablet (5 mg total) by mouth at bedtime. 90 tablet 3   aspirin 81 MG chewable tablet Chew 1 tablet (81 mg total) by mouth daily. (Patient taking differently: Chew 81 mg by mouth every evening.) 90 tablet 3   atorvastatin (LIPITOR) 20 MG tablet Take 1 tablet (20 mg total) by mouth daily. 90 tablet 3   benazepril (LOTENSIN) 40 MG tablet Take 1 tablet (40 mg total) by mouth daily.  90 tablet 3   gabapentin (NEURONTIN) 100 MG capsule 1 tablet in the morning and 2 at night 270 capsule 3   hydrochlorothiazide (MICROZIDE) 12.5 MG capsule Take 1 capsule (12.5 mg total) by mouth daily. 90 capsule 3   levothyroxine (SYNTHROID) 88 MCG tablet Take 1 tablet (88 mcg total) by mouth every morning. 30 minutes before food 90 tablet 3   Misc Natural Products (OSTEO BI-FLEX ADV TRIPLE ST) TABS Take 1 tablet by mouth in the morning. '60mg'$  of Vitamin C  (Ascorbic Acid); '2mg'$  Manganese (Manganese Sulfate); '35mg'$  Sodium; '1500mg'$  Glucosamine HCI; '100mg'$  5-Loxin (Boswellia Serrata Extract resin); '1103mg'$  (Chondroitin/MSM Complex (Chrondroitin Sulfate, Methylsulfonylmethane, Collagen (Hydrolyzed Gelatin), Boswellia Serrata (resin); Boron (Bororganic Glycine); Hyaluronic Acid       Multiple Vitamins-Minerals (CENTRUM SILVER ADULT 50+) TABS Take 1 tablet by mouth in the morning.       phenylephrine (NEO-SYNEPHRINE) 1 % nasal spray Place 1 drop into both nostrils 3 (three) times daily as needed for congestion.       tamoxifen (NOLVADEX) 20 MG tablet TAKE 1 TABLET EVERY DAY 90 tablet 10   venlafaxine XR (EFFEXOR-XR) 37.5 MG 24 hr capsule TAKE 1 CAPSULE EVERY DAY WITH BREAKFAST 90 capsule 10    No current facility-administered medications for this visit.      No Known Allergies       Review of Systems:               General:                      normal appetite, + decreased energy, no weight gain, no weight loss, no fever             Cardiac:                       + chest pain with exertion, no chest pain at rest, +SOB with mild exertion, no resting SOB, no PND, no orthopnea, no palpitations, + arrhythmia, + atrial fibrillation, + LE edema, + dizzy spells, no syncope             Respiratory:                 + exertional shortness of breath, no home oxygen, no productive cough, no dry cough, no bronchitis, no wheezing, no hemoptysis, no asthma, no pain with inspiration or cough, no sleep apnea, no CPAP at  night             GI:                               no difficulty swallowing, no reflux, no frequent heartburn, no hiatal hernia, no abdominal pain, no constipation, no diarrhea, no hematochezia, no hematemesis, no melena  GU:                              no dysuria,  no frequency, no urinary tract infection, no hematuria,  no kidney stones,  no kidney disease             Vascular:                     no pain suggestive of claudication, + pain in feet, no leg cramps, no varicose veins, no DVT, no non-healing foot ulcer             Neuro:                         + stroke, no TIA's, no seizures, + headaches, no temporary blindness one eye,  no slurred speech, + peripheral neuropathy, no chronic pain, + instability of gait, + memory/cognitive dysfunction             Musculoskeletal:         + arthritis, + joint swelling, + myalgias, + difficulty walking, + decreased mobility              Skin:                            no rash, no itching, no skin infections, no pressure sores or ulcerations             Psych:                         + anxiety, + depression, + nervousness, no unusual recent stress             Eyes:                           no blurry vision, no floaters, no recent vision changes, no glasses or contacts             ENT:                            + hearing loss, no loose or painful teeth, + dentures Hematologic:                        no easy bruising, no abnormal bleeding, no clotting disorder, no frequent epistaxis             Endocrine:                   no diabetes, does not check CBG's at home                            Physical Exam:               BP (!) 144/82 (BP Location: Right Arm, Patient Position: Sitting)   Pulse 82   Resp 20   Ht '5\' 1"'$  (1.549 m)   Wt 162 lb (73.5 kg)   SpO2 93% Comment: ra  BMI 30.61 kg/m              General:  Elderly,  well-appearing             HEENT:                       Unremarkable, NCAT, PERLA, EOMI              Neck:                           no JVD, no bruits, no adenopathy              Chest:                          clear to auscultation, symmetrical breath sounds, no wheezes, no rhonchi              CV:                              RRR, 3/6 systolic murmur RSB, no diastolic murmur             Abdomen:                    soft, non-tender, no masses              Extremities:                 warm, well-perfused, pulses palpable at ankles, mild lower extremity edema             Rectal/GU                   Deferred             Neuro:                         Grossly non-focal and symmetrical throughout             Skin:                            Clean and dry, no rashes, no breakdown   Diagnostic Tests:   ECHOCARDIOGRAM REPORT       Patient Name:   IA LEEB Date of Exam: 05/04/2022  Medical Rec #:  [id number]      Height:       61.0 in  Accession #:    [id number]     Weight:       164.2 lb  Date of Birth:  19-Feb-1941     BSA:          1.737 m  Patient Age:    78 years       BP:           135/74 mmHg  Patient Gender: F              HR:           71 bpm.  Exam Location:  [address]   Procedure: 2D Echo, Cardiac Doppler, Color Doppler and Strain Analysis   Indications:    I35.0 Aortic Stenosis    History:        Patient has prior history of Echocardiogram examinations,  most                 recent 10/27/2021. Stroke, Aortic Valve Disease; Risk  Factors:Hypertension, Diabetes and Family History of  Coronary                 Artery Disease. History of Left Breast Cancer status post                  Lumpectomy and Radiation (2022).    Sonographer:    Deliah Boston RDCS  Referring Phys: Woodfin Ganja THOMPSON   IMPRESSIONS     1. Left ventricular ejection fraction, by estimation, is 70 to 75%. The  left ventricle has hyperdynamic function. The left ventricle has no  regional wall motion abnormalities. There is severe asymmetric left  ventricular hypertrophy of the  basal-septal  segment. Left ventricular diastolic parameters are consistent with Grade  II diastolic dysfunction (pseudonormalization). Elevated left atrial  pressure. The average left ventricular global longitudinal strain is -22.1  %.   2. Right ventricular systolic function is normal. The right ventricular  size is normal. Tricuspid regurgitation signal is inadequate for assessing  PA pressure.   3. Left atrial size was severely dilated.   4. The mitral valve is degenerative. No evidence of mitral valve  regurgitation. Mild mitral stenosis. The mean mitral valve gradient is 4.0  mmHg with average heart rate of 68 bpm. Moderate mitral annular  calcification.   5. The inferior vena cava is normal in size with greater than 50%  respiratory variability, suggesting right atrial pressure of 3 mmHg.   6. The aortic valve is calcified. There is severe calcifcation of the  aortic valve. Aortic valve regurgitation is mild. Severe aortic valve  stenosis. Vmax 4.3 m/s, MG 45 mmHg, AVA 0.9 cm^2, DI 0.25   FINDINGS   Left Ventricle: Left ventricular ejection fraction, by estimation, is 70  to 75%. The left ventricle has hyperdynamic function. The left ventricle  has no regional wall motion abnormalities. The average left ventricular  global longitudinal strain is -22.1   %. The left ventricular internal cavity size was normal in size. There is  severe asymmetric left ventricular hypertrophy of the basal-septal  segment. Left ventricular diastolic parameters are consistent with Grade  II diastolic dysfunction  (pseudonormalization). Elevated left atrial pressure.   Right Ventricle: The right ventricular size is normal. No increase in  right ventricular wall thickness. Right ventricular systolic function is  normal. Tricuspid regurgitation signal is inadequate for assessing PA  pressure.   Left Atrium: Left atrial size was severely dilated.   Right Atrium: Right atrial size was normal in  size.   Pericardium: There is no evidence of pericardial effusion.   Mitral Valve: The mitral valve is degenerative in appearance. Moderate  mitral annular calcification. No evidence of mitral valve regurgitation.  Mild mitral valve stenosis. The mean mitral valve gradient is 4.0 mmHg  with average heart rate of 68 bpm.   Tricuspid Valve: The tricuspid valve is normal in structure. Tricuspid  valve regurgitation is trivial.   Aortic Valve: The aortic valve is calcified. There is severe calcifcation  of the aortic valve. Aortic valve regurgitation is mild. Aortic  regurgitation PHT measures 557 msec. Severe aortic stenosis is present.  Aortic valve mean gradient measures 37.6  mmHg. Aortic valve peak gradient measures 66.6 mmHg. Aortic valve area, by  VTI measures 0.97 cm.   Pulmonic Valve: The pulmonic valve was not well visualized. Pulmonic valve  regurgitation is trivial.   Aorta: The aortic root and ascending aorta are structurally normal, with  no evidence of dilitation.   Venous: The inferior  vena cava is normal in size with greater than 50%  respiratory variability, suggesting right atrial pressure of 3 mmHg.   IAS/Shunts: The interatrial septum was not well visualized.     LEFT VENTRICLE  PLAX 2D  LVIDd:         3.90 cm   Diastology  LVIDs:         1.40 cm   LV e' medial:    4.79 cm/s  LV PW:         0.90 cm   LV E/e' medial:  18.9  LV IVS:        1.60 cm   LV e' lateral:   4.60 cm/s  LVOT diam:     2.10 cm   LV E/e' lateral: 19.7  LV SV:         80  LV SV Index:   46        2D Longitudinal Strain  LVOT Area:     3.46 cm  2D Strain GLS (A2C):   -22.4 %                           2D Strain GLS (A3C):   -19.3 %                           2D Strain GLS (A4C):   -24.7 %                           2D Strain GLS Avg:     -22.1 %   RIGHT VENTRICLE  RV Basal diam:  3.20 cm  RV S prime:     20.70 cm/s  TAPSE (M-mode): 2.7 cm   LEFT ATRIUM              Index        RIGHT  ATRIUM           Index  LA diam:        4.80 cm  2.76 cm/m   RA Area:     18.70 cm  LA Vol (A2C):   109.0 ml 62.76 ml/m  RA Volume:   48.80 ml  28.10 ml/m  LA Vol (A4C):   78.8 ml  45.37 ml/m  LA Biplane Vol: 94.9 ml  54.64 ml/m   AORTIC VALVE  AV Area (Vmax):    0.88 cm  AV Area (Vmean):   0.83 cm  AV Area (VTI):     0.97 cm  AV Vmax:           408.00 cm/s  AV Vmean:          284.200 cm/s  AV VTI:            0.819 m  AV Peak Grad:      66.6 mmHg  AV Mean Grad:      37.6 mmHg  LVOT Vmax:         103.50 cm/s  LVOT Vmean:        68.400 cm/s  LVOT VTI:          0.230 m  LVOT/AV VTI ratio: 0.28  AI PHT:            557 msec    AORTA  Ao Root diam: 3.30 cm  Ao Asc diam:  3.50 cm   MITRAL VALVE  MV Area (PHT): cm  SHUNTS  MV Mean grad:  4.0 mmHg     Systemic VTI:  0.23 m  MV Decel Time: 256 msec     Systemic Diam: 2.10 cm  MV E velocity: 90.70 cm/s  MV A velocity: 172.00 cm/s  MV E/A ratio:  0.53   Oswaldo Milian MD  Electronically signed by Oswaldo Milian MD  Signature Date/Time: 05/04/2022/2:39:47 PM        Final      Physicians   Panel Physicians Referring Physician Case Authorizing Physician  Burnell Blanks, MD (Primary)        Procedures   RIGHT HEART CATH AND CORONARY ANGIOGRAPHY    Conclusion       Ost RCA to Prox RCA lesion is 20% stenosed.   Prox LAD lesion is 20% stenosed.   Mild non-obstructive CAD Normal right heart pressures. (PCWP 13 mmHg)   Recommendations: Will continue workup for TAVR.    Indications   Severe aortic stenosis [I35.0 (ICD-10-CM)]    Procedural Details   Technical Details Indication: 82 yo female with severe AS, workup for TAVR  Procedure: The risks, benefits, complications, treatment options, and expected outcomes were discussed with the patient. The patient and/or family concurred with the proposed plan, giving informed consent. The patient was sedated with Versed and Fentanyl. The IV  catheter present in the right antecubital vein was changed for a 5 [place] sheath. Right heart catheterization performed with a balloon tipped catheter. The right wrist was prepped and draped in a sterile fashion. 1% lidocaine was used for local anesthesia. Using the modified Seldinger access technique, a 5 French sheath was placed in the right radial artery. 3 mg Verapamil was given through the sheath. Weight based IV heparin was given. Standard diagnostic catheters were used to perform selective coronary angiography. I did not cross the aortic valve. All catheter exchanges were performed over an exchange length guidewire.   The sheath was removed from the right radial artery and a hemostasis band was applied at the arteriotomy site on the right wrist.      Estimated blood loss <50 mL.   During this procedure medications were administered to achieve and maintain moderate conscious sedation while the patient's heart rate, blood pressure, and oxygen saturation were continuously monitored and I was present face-to-face 100% of this time.    Medications (Filter: Administrations occurring from 1325 to 1423 on 05/17/22) fentaNYL (SUBLIMAZE) injection (mcg)  Total dose: 25 mcg Date/Time Rate/Dose/Volume Action    05/17/22 1344 25 mcg Given    midazolam (VERSED) injection (mg)  Total dose: 1 mg Date/Time Rate/Dose/Volume Action    05/17/22 1345 1 mg Given    lidocaine (PF) (XYLOCAINE) 1 % injection (mL)  Total volume: 4 mL Date/Time Rate/Dose/Volume Action    05/17/22 1357 2 mL Given    1359 2 mL Given    heparin sodium (porcine) injection (Units)  Total dose: 4,000 Units Date/Time Rate/Dose/Volume Action    05/17/22 1407 4,000 Units Given    Heparin (Porcine) in NaCl 1000-0.9 UT/500ML-% SOLN (mL)  Total volume: 1,000 mL Date/Time Rate/Dose/Volume Action    05/17/22 1416 500 mL Given    1417 500 mL Given    iohexol (OMNIPAQUE) 350 MG/ML injection (mL)  Total volume: 45 mL Date/Time  Rate/Dose/Volume Action    05/17/22 1421 45 mL Given    Radial Cocktail/Verapamil only (mL)  Total volume: 10 mL Date/Time Rate/Dose/Volume Action    05/17/22 1359 10 mL Given      Sedation  Time   Sedation Time Physician-1: 28 minutes 8 seconds Contrast   Medication Name Total Dose  iohexol (OMNIPAQUE) 350 MG/ML injection 45 mL    Radiation/Fluoro   Fluoro time: 4.9 (min) DAP: 12459 (mGycm2) Cumulative Air Kerma: 401 (mGy) Complications   Complications documented before study signed (05/17/2022  0:27 PM)    No complications were associated with this study.  Documented by Bethann Punches, RN - 05/17/2022  2:21 PM      Coronary Findings   Diagnostic Dominance: Right Left Anterior Descending  Vessel is large.  Prox LAD lesion is 20% stenosed.    Left Circumflex  Vessel is large.    Right Coronary Artery  Vessel is large.  Ost RCA to Prox RCA lesion is 20% stenosed.    Intervention    No interventions have been documented.    Coronary Diagrams   Diagnostic Dominance: Right  Intervention    Implants    No implant documentation for this case.    [employer] Images    Show images for CARDIAC CATHETERIZATION Images on Long Term Storage    Show images for Cal[place], Stockinger to Procedure Log   Procedure Log    Hemo Data   Flowsheet Row Most Recent Value  Fick Cardiac Output 6.47 L/min  Fick Cardiac Output Index 3.73 (L/min)/BSA  RA A Wave 8 mmHg  RA V Wave 6 mmHg  RA Mean 4 mmHg  RV Systolic Pressure 31 mmHg  RV Diastolic Pressure 8 mmHg  RV EDP 10 mmHg  PA Systolic Pressure 29 mmHg  PA Diastolic Pressure 18 mmHg  PA Mean 25 mmHg  PW A Wave 16 mmHg  PW V Wave 19 mmHg  PW Mean 13 mmHg  AO Systolic Pressure 253 mmHg  AO Diastolic Pressure 67 mmHg  AO Mean 97 mmHg  QP/QS 1  TPVR Index 6.7 HRUI  TSVR Index 25.99 HRUI  PVR SVR Ratio 0.13  TPVR/TSVR Ratio 0.26      ADDENDUM REPORT: 06/05/2022 10:38   CLINICAL DATA:  Severe Aortic Stenosis.    EXAM: Cardiac TAVR CT   TECHNIQUE: A non-contrast, gated CT scan was obtained with axial slices of 3 mm through the heart for aortic valve calcium scoring. A 120 kV retrospective, gated, contrast cardiac scan was obtained. Gantry rotation speed was 250 msecs and collimation was 0.6 mm. Nitroglycerin was not given. The 3D data set was reconstructed in 5% intervals of the 0-95% of the R-R cycle. Systolic and diastolic phases were analyzed on a dedicated workstation using MPR, MIP, and VRT modes. The patient received 100 cc of contrast.   FINDINGS: Image quality: Excellent.   Noise artifact is: Limited.   Valve Morphology: Tricuspid aortic valve with severe diffuse calcifications. Severely restricted leaflet motion in systole.   Aortic Valve Calcium score: 2026   Aortic annular dimension:   Phase assessed: 25%   Annular area: 415 mm2   Annular perimeter: 73.6 mm   Max diameter: 25.2 mm   Min diameter: 22.3 mm   Annular and subannular calcification: None.   Membranous septum length: 8.2 mm   Optimal coplanar projection: LAO 19 CRA 1   Coronary Artery Height above Annulus:   Left Main: 16.7 mm   Right Coronary: 17.4 mm   Sinus of Valsalva Measurements:   Non-coronary: 31 mm   Right-coronary: 31 mm   Left-coronary: 32 mm   Sinus of Valsalva Height:   Non-coronary: 24.6 mm   Right-coronary: 21.5 mm   Left-coronary:  22.7 mm   Sinotubular Junction: 30 mm   Ascending Thoracic Aorta: Mild dilation of the ascending aorta, 40 mm.   Coronary Arteries: Normal coronary origin. Right dominance. The study was performed without use of NTG and is insufficient for plaque evaluation. Please refer to recent cardiac catheterization for coronary assessment.   Cardiac Morphology:   Right Atrium: Right atrial size is within normal limits.   Right Ventricle: The right ventricular cavity is within normal limits.   Left Atrium: Left atrial size is normal in size  with no left atrial appendage filling defect. 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   Left Ventricle: The ventricular cavity size is within normal limits. Severe asymmetric hypertrophy of the basal septum up to 20 mm. Small diverticulum of the ventricular apex.   Pulmonary arteries: Normal in size without proximal filling defect.   Pulmonary veins: Normal pulmonary venous drainage.   Pericardium: Normal thickness with no significant effusion or calcium present.   Mitral Valve: The mitral valve is normal structure without significant calcification.   Extra-cardiac findings: See attached radiology report for non-cardiac structures.   IMPRESSION: 1. Annular measurements support a 23 mm S3 or 29 mm Evolut Pro.   2. No significant annular or subannular calcifications.   3. Sufficient coronary to annulus distance.   4. Optimal Fluoroscopic Angle for Delivery: LAO 19 CRA 1   5. Mild dilation of the ascending aorta up to 40 mm.   6. Severe asymmetric hypertrophy of the basal septum up to 20 mm.   7. Small diverticulum of the ventricular apex.   8. 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   Lake Bells T. Audie Box, MD     Electronically Signed   By: Eleonore Chiquito M.D.   On: 06/05/2022 10:38    Addended by Geralynn Rile, MD on 06/05/2022 10:41 AM    Study Result   Narrative & Impression  EXAM: OVER-READ INTERPRETATION  CT CHEST   The following report is a limited chest CT over-read performed by radiologist Dr. Salvatore Marvel of [facility], [place] on 06/04/2022. This over-read does not include interpretation of cardiac or coronary anatomy or pathology. The cardiac CTA interpretation by the cardiologist is attached.   COMPARISON:  None Available.   FINDINGS: Please see the separate concurrent chest CT angiogram report for details.   IMPRESSION: Please see the separate concurrent chest CT angiogram report for details.   Electronically Signed: By: Ilona Sorrel M.D. On:  06/04/2022 11:42          Narrative & Impression  CLINICAL DATA:  Aortic valve replacement (TAVR), pre-op eval. Non rheumatic aortic valve stenosis. History of left breast cancer. * Tracking Code: BO *   EXAM: CT ANGIOGRAPHY CHEST, ABDOMEN AND PELVIS   TECHNIQUE: Multidetector CT imaging through the chest, abdomen and pelvis was performed using the standard protocol during bolus administration of intravenous contrast. Multiplanar reconstructed images and MIPs were obtained and reviewed to evaluate the vascular anatomy.   RADIATION DOSE REDUCTION: This exam was performed according to the departmental dose-optimization program which includes automated exposure control, adjustment of the mA and/or kV according to patient size and/or use of iterative reconstruction technique.   CONTRAST:  35m OMNIPAQUE IOHEXOL 350 MG/ML SOLN   COMPARISON:  None Available.   FINDINGS: CTA CHEST FINDINGS   Cardiovascular: Mild cardiomegaly. Diffuse thickening and coarse calcification of the aortic valve. No significant pericardial effusion/thickening. Atherosclerotic thoracic aorta with dilated 4.1 cm ascending thoracic aorta. Normal caliber pulmonary  arteries. No central pulmonary emboli.   Mediastinum/Nodes: Hypodense bilateral thyroid nodules, largest 1.7 cm on the right. Unremarkable esophagus. Surgical clips in the left axilla. No axillary adenopathy.   Lungs/Pleura: No pneumothorax. No pleural effusion. Solid 0.5 cm medial basilar right lower lobe pulmonary nodule (series 5/image 87). Solid 0.5 cm posterior left upper lobe pulmonary nodule (series 5/image 69). No acute consolidative airspace disease.   Musculoskeletal: No aggressive appearing focal osseous lesions. Moderate thoracic spondylosis. Surgical clips in the left breast. Asymmetric left breast skin thickening.   CTA ABDOMEN AND PELVIS FINDINGS   Hepatobiliary: Normal liver with no liver mass. Normal gallbladder with no  radiopaque cholelithiasis. No biliary ductal dilatation.   Pancreas: Normal, with no mass or duct dilation.   Spleen: Normal size. No mass.   Adrenals/Urinary Tract: Normal adrenals. No contour deforming renal masses. No hydronephrosis. Scattered subcentimeter hypodense bilateral renal cortical lesions are too small to characterize, for which no follow-up imaging is recommended. Collapsed bladder is obscured by streak artifact from left hip hardware and grossly normal.   Stomach/Bowel: Small hiatal hernia. Otherwise normal nondistended stomach. Normal caliber small bowel with no small bowel wall thickening. Appendix not discretely visualized. Normal large bowel with no diverticulosis, large bowel wall thickening or pericolonic fat stranding.   Vascular/Lymphatic: Atherosclerotic nonaneurysmal abdominal aorta. Patent portal, splenic and renal veins. No pathologically enlarged lymph nodes in the abdomen or pelvis.   Reproductive: Status post hysterectomy, with no abnormal findings at the vaginal cuff. No adnexal mass.   Other: No pneumoperitoneum, ascites or focal fluid collection. Large fat density 14.3 x 6.2 x 16.5 cm right gluteal intramuscular mass (series 4/image 185).   Musculoskeletal: No aggressive appearing focal osseous lesions. Left total hip arthroplasty. Marked lumbar degenerative disc disease. Bilateral posterior spinal fusion at L4-5.   VASCULAR MEASUREMENTS PERTINENT TO TAVR:   AORTA:   Minimal Aortic Diameter-14.7 x 13.3 mm   Severity of Aortic Calcification-moderate   RIGHT PELVIS:   Right Common Iliac Artery -   Minimal Diameter-10.2 x 9.2 mm   Tortuosity-mild   Calcification-mild   Right External Iliac Artery -   Minimal Diameter-7.6 x 7.3 mm   Tortuosity-mild   Calcification-none   Right Common Femoral Artery -   Minimal Diameter-8.7 x 7.3 mm   Tortuosity-mild   Calcification-mild   LEFT PELVIS:   Left Common Iliac Artery -    Minimal Diameter-10.9 x 9.3 mm   Tortuosity-mild   Calcification-mild   Left External Iliac Artery -   Minimal Diameter-7.8 x 7.4 mm   Tortuosity-mild   Calcification-none   Left Common Femoral Artery -   Minimal Diameter-8.0 x 7.2 mm   Tortuosity-mild   Calcification-mild   Review of the MIP images confirms the above findings.   IMPRESSION: 1. Vascular findings and measurements pertinent to potential TAVR procedure, as detailed. 2. Diffuse thickening and coarse calcification of the aortic valve, compatible with the reported history of aortic stenosis. 3. Mild cardiomegaly. 4. Two solid pulmonary nodules, largest 0.5 cm. Recommend attention on follow-up chest CT in 3 months given the history of malignancy and absence of prior scans. 5. Hypodense bilateral thyroid nodules, largest 1.7 cm on the right. Recommend thyroid US (ref: J Am Coll Radiol. 2015 Feb;12(2): 143-50). 6. 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 Recommend annual imaging followup by CTA or MRA. This recommendation follows 2010 ACCF/AHA/AATS/ACR/ASA/SCA/SCAI/SIR/STS/SVM Guidelines for the Diagnosis and Management of Patients with Thoracic Aortic Disease. Circulation. 2010; 121: Y301-S010. Aortic aneurysm NOS (ICD10-I71.9). 7. Large fat  density 14.3 x 6.2 x 16.5 cm right gluteal intramuscular mass, most compatible with a lipoma. 8. Small hiatal hernia. 9.  Aortic Atherosclerosis (ICD10-I70.0).     Electronically Signed   By: Ilona Sorrel M.D.   On: 06/04/2022 12:18        Impression:   This 82 year old woman has stage D, severe, symptomatic aortic stenosis with NYHA class II symptoms of exertional fatigue and shortness of breath consistent with chronic diastolic congestive heart failure.  I personally reviewed her 2D echocardiogram, cardiac catheterization, and CTA studies.  Her echocardiogram shows a severely calcified trileaflet aortic valve with restricted leaflet mobility.  The mean gradient  is 38 mmHg with a peak gradient of 67 mmHg and a valve area of 0.97 cm consistent with severe aortic stenosis.  There is mild aortic insufficiency.  Left ventricular ejection fraction is normal.  Cardiac catheterization showed mild nonobstructive coronary disease with normal right heart pressures.  I agree that aortic valve replacement is indicated in this patient for relief of her symptoms and to prevent left ventricular dysfunction.  Given her advanced age and comorbidities I think that transcatheter aortic valve replacement would be the best treatment option for her.  Her gated cardiac CTA shows anatomy suitable for TAVR using a 23 mm Edwards SAPIEN 3 valve.  There is significant asymmetric hypertrophy of the basal septum but I do not think that will cause a problem for implanting a SAPIEN 3 valve.  Her abdominal and pelvic CTA shows adequate pelvic vascular anatomy to allow transfemoral insertion.  Electrocardiogram shows sinus rhythm with incomplete right bundle branch block and left anterior fascicular block which will increase her risk of needing a permanent pacemaker postoperatively.   The patient and her friend were counseled at length regarding treatment alternatives for management of severe symptomatic aortic stenosis. The risks and benefits of surgical intervention has been discussed in detail. Long-term prognosis with medical therapy was discussed. Alternative approaches such as conventional surgical aortic valve replacement, transcatheter aortic valve replacement, and palliative medical therapy were compared and contrasted at length. This discussion was placed in the context of the patient's own specific clinical presentation and past medical history. All of their questions have been addressed.    Following the decision to proceed with transcatheter aortic valve replacement, a discussion was held regarding what types of management strategies would be attempted intraoperatively in the event of  life-threatening complications, including whether or not the patient would be considered a candidate for the use of cardiopulmonary bypass and/or conversion to open sternotomy for attempted surgical intervention.  Given her advanced age and comorbidities I do not think she is a candidate for emergent sternotomy to manage any intraoperative complications.  The patient is aware of the fact that transient use of cardiopulmonary bypass may be necessary. The patient has been advised of a variety of complications that might develop including but not limited to risks of death, stroke, paravalvular leak, aortic dissection or other major vascular complications, aortic annulus rupture, device embolization, cardiac rupture or perforation, mitral regurgitation, acute myocardial infarction, arrhythmia, heart block or bradycardia requiring permanent pacemaker placement, congestive heart failure, respiratory failure, renal failure, pneumonia, infection, other late complications related to structural valve deterioration or migration, or other complications that might ultimately cause a temporary or permanent loss of functional independence or other long term morbidity. The patient provides full informed consent for the procedure as described and all questions were answered.       Plan:   Transfemoral TAVR  using a SAPIEN 3 valve.    [name], MD

## 2022-06-26 ENCOUNTER — Inpatient Hospital Stay (HOSPITAL_COMMUNITY): Payer: Medicare HMO | Admitting: Physician Assistant

## 2022-06-26 ENCOUNTER — Encounter (HOSPITAL_COMMUNITY): Payer: Self-pay | Admitting: Cardiovascular Disease

## 2022-06-26 ENCOUNTER — Inpatient Hospital Stay (HOSPITAL_COMMUNITY)
Admission: RE | Admit: 2022-06-26 | Discharge: 2022-06-27 | DRG: 267 | Disposition: A | Discharge status: Home / Self Care | Payer: Medicare HMO | Attending: Surgery | Admitting: Surgery

## 2022-06-26 ENCOUNTER — Inpatient Hospital Stay (HOSPITAL_COMMUNITY): Payer: Medicare HMO

## 2022-06-26 ENCOUNTER — Encounter (HOSPITAL_COMMUNITY)
Admission: RE | Disposition: A | Discharge status: Home / Self Care | Payer: Self-pay | Source: Home / Self Care | Attending: Surgery

## 2022-06-26 ENCOUNTER — Other Ambulatory Visit: Payer: Self-pay

## 2022-06-26 ENCOUNTER — Other Ambulatory Visit: Payer: Self-pay | Admitting: Physician Assistant

## 2022-06-26 DIAGNOSIS — D638 Anemia in other chronic diseases classified elsewhere: Secondary | ICD-10-CM

## 2022-06-26 DIAGNOSIS — I6522 Occlusion and stenosis of left carotid artery: Secondary | ICD-10-CM | POA: Diagnosis present

## 2022-06-26 DIAGNOSIS — I10 Essential (primary) hypertension: Secondary | ICD-10-CM | POA: Diagnosis not present

## 2022-06-26 DIAGNOSIS — M8580 Other specified disorders of bone density and structure, unspecified site: Secondary | ICD-10-CM | POA: Diagnosis present

## 2022-06-26 DIAGNOSIS — I352 Nonrheumatic aortic (valve) stenosis with insufficiency: Principal | ICD-10-CM | POA: Diagnosis present

## 2022-06-26 DIAGNOSIS — Z79810 Long term (current) use of selective estrogen receptor modulators (SERMs): Secondary | ICD-10-CM

## 2022-06-26 DIAGNOSIS — E1122 Type 2 diabetes mellitus with diabetic chronic kidney disease: Secondary | ICD-10-CM | POA: Diagnosis present

## 2022-06-26 DIAGNOSIS — G6289 Other specified polyneuropathies: Secondary | ICD-10-CM | POA: Diagnosis not present

## 2022-06-26 DIAGNOSIS — I080 Rheumatic disorders of both mitral and aortic valves: Secondary | ICD-10-CM | POA: Diagnosis not present

## 2022-06-26 DIAGNOSIS — E039 Hypothyroidism, unspecified: Secondary | ICD-10-CM | POA: Diagnosis present

## 2022-06-26 DIAGNOSIS — N390 Urinary tract infection, site not specified: Secondary | ICD-10-CM | POA: Diagnosis present

## 2022-06-26 DIAGNOSIS — Z006 Encounter for examination for normal comparison and control in clinical research program: Secondary | ICD-10-CM

## 2022-06-26 DIAGNOSIS — Z853 Personal history of malignant neoplasm of breast: Secondary | ICD-10-CM

## 2022-06-26 DIAGNOSIS — Z923 Personal history of irradiation: Secondary | ICD-10-CM

## 2022-06-26 DIAGNOSIS — E042 Nontoxic multinodular goiter: Secondary | ICD-10-CM | POA: Diagnosis present

## 2022-06-26 DIAGNOSIS — I5032 Chronic diastolic (congestive) heart failure: Secondary | ICD-10-CM | POA: Diagnosis present

## 2022-06-26 DIAGNOSIS — I712 Thoracic aortic aneurysm, without rupture, unspecified: Secondary | ICD-10-CM | POA: Diagnosis present

## 2022-06-26 DIAGNOSIS — R918 Other nonspecific abnormal finding of lung field: Secondary | ICD-10-CM | POA: Diagnosis present

## 2022-06-26 DIAGNOSIS — Z952 Presence of prosthetic heart valve: Secondary | ICD-10-CM

## 2022-06-26 DIAGNOSIS — I7121 Aneurysm of the ascending aorta, without rupture: Secondary | ICD-10-CM | POA: Diagnosis present

## 2022-06-26 DIAGNOSIS — Z954 Presence of other heart-valve replacement: Secondary | ICD-10-CM | POA: Diagnosis not present

## 2022-06-26 DIAGNOSIS — I471 Supraventricular tachycardia, unspecified: Secondary | ICD-10-CM | POA: Diagnosis present

## 2022-06-26 DIAGNOSIS — Z602 Problems related to living alone: Secondary | ICD-10-CM | POA: Diagnosis present

## 2022-06-26 DIAGNOSIS — Z90710 Acquired absence of both cervix and uterus: Secondary | ICD-10-CM

## 2022-06-26 DIAGNOSIS — Z981 Arthrodesis status: Secondary | ICD-10-CM

## 2022-06-26 DIAGNOSIS — I453 Trifascicular block: Secondary | ICD-10-CM

## 2022-06-26 DIAGNOSIS — I69398 Other sequelae of cerebral infarction: Secondary | ICD-10-CM

## 2022-06-26 DIAGNOSIS — I110 Hypertensive heart disease with heart failure: Secondary | ICD-10-CM | POA: Diagnosis not present

## 2022-06-26 DIAGNOSIS — Z96642 Presence of left artificial hip joint: Secondary | ICD-10-CM | POA: Diagnosis present

## 2022-06-26 DIAGNOSIS — Z89421 Acquired absence of other right toe(s): Secondary | ICD-10-CM

## 2022-06-26 DIAGNOSIS — I69351 Hemiplegia and hemiparesis following cerebral infarction affecting right dominant side: Secondary | ICD-10-CM

## 2022-06-26 DIAGNOSIS — Z8249 Family history of ischemic heart disease and other diseases of the circulatory system: Secondary | ICD-10-CM

## 2022-06-26 DIAGNOSIS — Z79899 Other long term (current) drug therapy: Secondary | ICD-10-CM | POA: Diagnosis not present

## 2022-06-26 DIAGNOSIS — I350 Nonrheumatic aortic (valve) stenosis: Secondary | ICD-10-CM

## 2022-06-26 DIAGNOSIS — Z8349 Family history of other endocrine, nutritional and metabolic diseases: Secondary | ICD-10-CM | POA: Diagnosis not present

## 2022-06-26 DIAGNOSIS — I2510 Atherosclerotic heart disease of native coronary artery without angina pectoris: Secondary | ICD-10-CM | POA: Diagnosis present

## 2022-06-26 DIAGNOSIS — R413 Other amnesia: Secondary | ICD-10-CM | POA: Diagnosis present

## 2022-06-26 HISTORY — PX: TRANSCATHETER AORTIC VALVE REPLACEMENT, TRANSFEMORAL: SHX6400

## 2022-06-26 HISTORY — PX: ULTRASOUND GUIDANCE FOR VASCULAR ACCESS: SHX6516

## 2022-06-26 HISTORY — PX: INTRAOPERATIVE TRANSTHORACIC ECHOCARDIOGRAM: SHX6523

## 2022-06-26 HISTORY — DX: Presence of prosthetic heart valve: Z95.2

## 2022-06-26 LAB — POCT I-STAT, CHEM 8
BUN: 16 mg/dL (ref 8–23)
BUN: 16 mg/dL (ref 8–23)
Calcium, Ion: 1.21 mmol/L (ref 1.15–1.40)
Calcium, Ion: 1.18 mmol/L (ref 1.15–1.40)
Chloride: 105 mmol/L (ref 98–111)
Chloride: 106 mmol/L (ref 98–111)
Creatinine, Ser: 0.9 mg/dL (ref 0.44–1.00)
Creatinine, Ser: 0.8 mg/dL (ref 0.44–1.00)
Glucose, Bld: 148 mg/dL — ABNORMAL HIGH (ref 70–99)
Glucose, Bld: 189 mg/dL — ABNORMAL HIGH (ref 70–99)
HCT: 35 % — ABNORMAL LOW (ref 36.0–46.0)
HCT: 32 % — ABNORMAL LOW (ref 36.0–46.0)
Hemoglobin: 11.9 g/dL — ABNORMAL LOW (ref 12.0–15.0)
Hemoglobin: 10.9 g/dL — ABNORMAL LOW (ref 12.0–15.0)
Potassium: 4.2 mmol/L (ref 3.5–5.1)
Potassium: 3.7 mmol/L (ref 3.5–5.1)
Sodium: 138 mmol/L (ref 135–145)
Sodium: 141 mmol/L (ref 135–145)
TCO2: 21 mmol/L — ABNORMAL LOW (ref 22–32)
TCO2: 23 mmol/L (ref 22–32)

## 2022-06-26 LAB — ECHOCARDIOGRAM LIMITED
AR max vel: 3.15 cm2
AV Area VTI: 3.01 cm2
AV Area mean vel: 3.55 cm2
AV Mean grad: 7 mmHg
AV Peak grad: 12.4 mmHg
Ao pk vel: 1.76 m/s
MV VTI: 2.3 cm2

## 2022-06-26 SURGERY — IMPLANTATION, AORTIC VALVE, TRANSCATHETER, FEMORAL APPROACH
Anesthesia: Monitor Anesthesia Care | Site: Groin

## 2022-06-26 MED ORDER — ONDANSETRON HCL 4 MG/2ML IJ SOLN
INTRAMUSCULAR | Status: AC
  Filled 2022-06-26: qty 2

## 2022-06-26 MED ORDER — LIDOCAINE HCL (PF) 1 % IJ SOLN
INTRAMUSCULAR | Status: AC
  Filled 2022-06-26: qty 30

## 2022-06-26 MED ORDER — ACETAMINOPHEN 325 MG PO TABS
650.0000 mg | ORAL_TABLET | Freq: Four times a day (QID) | ORAL | Status: DC | PRN
  Administered 2022-06-27: 650 mg via ORAL
  Filled 2022-06-26: qty 2

## 2022-06-26 MED ORDER — CHLORHEXIDINE GLUCONATE 4 % EX LIQD: 30.0000 mL | CUTANEOUS | Status: DC

## 2022-06-26 MED ORDER — FENTANYL CITRATE (PF) 250 MCG/5ML IJ SOLN
INTRAMUSCULAR | Status: AC
  Filled 2022-06-26: qty 5

## 2022-06-26 MED ORDER — CHLORHEXIDINE GLUCONATE 0.12 % MT SOLN
15.0000 mL | Freq: Once | OROMUCOSAL | Status: DC
  Filled 2022-06-26: qty 15

## 2022-06-26 MED ORDER — HEPARIN 6000 UNIT IRRIGATION SOLUTION
Status: AC
  Filled 2022-06-26: qty 1500

## 2022-06-26 MED ORDER — GABAPENTIN 100 MG PO CAPS
200.0000 mg | ORAL_CAPSULE | Freq: Every day | ORAL | Status: DC
  Administered 2022-06-26: 200 mg via ORAL
  Filled 2022-06-26: qty 2

## 2022-06-26 MED ORDER — SODIUM CHLORIDE 0.9 % IV SOLN: 250.0000 mL | INTRAVENOUS | Status: DC | PRN

## 2022-06-26 MED ORDER — LEVOTHYROXINE SODIUM 88 MCG PO TABS
88.0000 ug | ORAL_TABLET | ORAL | Status: DC
  Administered 2022-06-27: 88 ug via ORAL
  Filled 2022-06-26: qty 1

## 2022-06-26 MED ORDER — HEPARIN SODIUM (PORCINE) 1000 UNIT/ML IJ SOLN
INTRAMUSCULAR | Status: AC
  Filled 2022-06-26: qty 10

## 2022-06-26 MED ORDER — SODIUM CHLORIDE 0.9 % IV SOLN: INTRAVENOUS | Status: AC

## 2022-06-26 MED ORDER — ORAL CARE MOUTH RINSE: 15.0000 mL | Freq: Once | OROMUCOSAL | Status: AC

## 2022-06-26 MED ORDER — ATORVASTATIN CALCIUM 10 MG PO TABS
20.0000 mg | ORAL_TABLET | Freq: Every day | ORAL | Status: DC
  Administered 2022-06-26 – 2022-06-27 (×2): 20 mg via ORAL
  Filled 2022-06-26 (×2): qty 2

## 2022-06-26 MED ORDER — IODIXANOL 320 MG/ML IV SOLN
INTRAVENOUS | Status: DC | PRN
  Administered 2022-06-26: 80 mL

## 2022-06-26 MED ORDER — ONDANSETRON HCL 4 MG/2ML IJ SOLN
INTRAMUSCULAR | Status: DC | PRN
  Administered 2022-06-26: 4 mg via INTRAVENOUS

## 2022-06-26 MED ORDER — ASPIRIN 81 MG PO CHEW
81.0000 mg | CHEWABLE_TABLET | Freq: Every day | ORAL | Status: DC
  Administered 2022-06-26 – 2022-06-27 (×2): 81 mg via ORAL
  Filled 2022-06-26 (×2): qty 1

## 2022-06-26 MED ORDER — VENLAFAXINE HCL ER 37.5 MG PO CP24
37.5000 mg | ORAL_CAPSULE | Freq: Every day | ORAL | Status: DC
  Administered 2022-06-27: 37.5 mg via ORAL
  Filled 2022-06-26: qty 1

## 2022-06-26 MED ORDER — TRAMADOL HCL 50 MG PO TABS
50.0000 mg | ORAL_TABLET | ORAL | Status: DC | PRN
  Administered 2022-06-26: 50 mg via ORAL
  Filled 2022-06-26: qty 1

## 2022-06-26 MED ORDER — GABAPENTIN 100 MG PO CAPS
100.0000 mg | ORAL_CAPSULE | Freq: Every day | ORAL | Status: DC
  Administered 2022-06-27: 100 mg via ORAL
  Filled 2022-06-26: qty 1

## 2022-06-26 MED ORDER — HYDROCHLOROTHIAZIDE 12.5 MG PO TABS
12.5000 mg | ORAL_TABLET | Freq: Every day | ORAL | Status: DC
  Administered 2022-06-26 – 2022-06-27 (×2): 12.5 mg via ORAL
  Filled 2022-06-26 (×2): qty 1

## 2022-06-26 MED ORDER — PROPOFOL 10 MG/ML IV BOLUS
INTRAVENOUS | Status: AC
  Filled 2022-06-26: qty 20

## 2022-06-26 MED ORDER — ACETAMINOPHEN 650 MG RE SUPP: 650.0000 mg | Freq: Four times a day (QID) | RECTAL | Status: DC | PRN

## 2022-06-26 MED ORDER — TAMOXIFEN CITRATE 10 MG PO TABS
20.0000 mg | ORAL_TABLET | Freq: Every day | ORAL | Status: DC
  Administered 2022-06-26 – 2022-06-27 (×2): 20 mg via ORAL
  Filled 2022-06-26 (×2): qty 2

## 2022-06-26 MED ORDER — BENAZEPRIL HCL 5 MG PO TABS
40.0000 mg | ORAL_TABLET | Freq: Every day | ORAL | Status: DC
  Administered 2022-06-26 – 2022-06-27 (×2): 40 mg via ORAL
  Filled 2022-06-26 (×2): qty 8

## 2022-06-26 MED ORDER — LACTATED RINGERS IV SOLN: INTRAVENOUS | Status: DC

## 2022-06-26 MED ORDER — PROPOFOL 10 MG/ML IV BOLUS
INTRAVENOUS | Status: DC | PRN
  Administered 2022-06-26 (×2): 20 mg via INTRAVENOUS

## 2022-06-26 MED ORDER — AMLODIPINE BESYLATE 5 MG PO TABS
5.0000 mg | ORAL_TABLET | Freq: Every day | ORAL | Status: DC
  Administered 2022-06-26: 5 mg via ORAL
  Filled 2022-06-26: qty 1

## 2022-06-26 MED ORDER — PROTAMINE SULFATE 10 MG/ML IV SOLN
INTRAVENOUS | Status: DC | PRN
  Administered 2022-06-26: 100 mg via INTRAVENOUS

## 2022-06-26 MED ORDER — OXYCODONE HCL 5 MG PO TABS
5.0000 mg | ORAL_TABLET | ORAL | Status: DC | PRN
  Administered 2022-06-26: 5 mg via ORAL
  Filled 2022-06-26: qty 1

## 2022-06-26 MED ORDER — ACETAMINOPHEN 500 MG PO TABS
1000.0000 mg | ORAL_TABLET | Freq: Once | ORAL | Status: AC
  Administered 2022-06-26: 1000 mg via ORAL
  Filled 2022-06-26: qty 2

## 2022-06-26 MED ORDER — PROTAMINE SULFATE 10 MG/ML IV SOLN
INTRAVENOUS | Status: AC
  Filled 2022-06-26: qty 10

## 2022-06-26 MED ORDER — PHENYLEPHRINE 80 MCG/ML (10ML) SYRINGE FOR IV PUSH (FOR BLOOD PRESSURE SUPPORT)
PREFILLED_SYRINGE | INTRAVENOUS | Status: AC
  Filled 2022-06-26: qty 10

## 2022-06-26 MED ORDER — PROPOFOL 500 MG/50ML IV EMUL
INTRAVENOUS | Status: DC | PRN
  Administered 2022-06-26: 20 ug/kg/min via INTRAVENOUS

## 2022-06-26 MED ORDER — SODIUM CHLORIDE 0.9 % IV SOLN: INTRAVENOUS | Status: DC

## 2022-06-26 MED ORDER — NITROGLYCERIN IN D5W 200-5 MCG/ML-% IV SOLN
0.0000 ug/min | INTRAVENOUS | Status: DC
  Filled 2022-06-26: qty 250

## 2022-06-26 MED ORDER — EPHEDRINE 5 MG/ML INJ
INTRAVENOUS | Status: AC
  Filled 2022-06-26: qty 10

## 2022-06-26 MED ORDER — MORPHINE SULFATE (PF) 2 MG/ML IV SOLN: 1.0000 mg | INTRAVENOUS | Status: DC | PRN

## 2022-06-26 MED ORDER — CHLORHEXIDINE GLUCONATE 4 % EX LIQD: 60.0000 mL | Freq: Once | CUTANEOUS | Status: DC

## 2022-06-26 MED ORDER — SODIUM CHLORIDE 0.9% FLUSH: 3.0000 mL | INTRAVENOUS | Status: DC | PRN

## 2022-06-26 MED ORDER — PROPOFOL 1000 MG/100ML IV EMUL
INTRAVENOUS | Status: AC
  Filled 2022-06-26: qty 100

## 2022-06-26 MED ORDER — CHLORHEXIDINE GLUCONATE 0.12 % MT SOLN
15.0000 mL | Freq: Once | OROMUCOSAL | Status: AC
  Administered 2022-06-26: 15 mL via OROMUCOSAL

## 2022-06-26 MED ORDER — AMITRIPTYLINE HCL 50 MG PO TABS
50.0000 mg | ORAL_TABLET | Freq: Every day | ORAL | Status: DC
  Administered 2022-06-26: 50 mg via ORAL
  Filled 2022-06-26: qty 1

## 2022-06-26 MED ORDER — HEPARIN SODIUM (PORCINE) 1000 UNIT/ML IJ SOLN
INTRAMUSCULAR | Status: DC | PRN
  Administered 2022-06-26: 10000 [IU] via INTRAVENOUS
  Administered 2022-06-26: 2000 [IU] via INTRAVENOUS

## 2022-06-26 MED ORDER — LIDOCAINE HCL 1 % IJ SOLN
INTRAMUSCULAR | Status: DC | PRN
  Administered 2022-06-26: 10 mL

## 2022-06-26 MED ORDER — ONDANSETRON HCL 4 MG/2ML IJ SOLN: 4.0000 mg | Freq: Four times a day (QID) | INTRAMUSCULAR | Status: DC | PRN

## 2022-06-26 MED ORDER — CEFAZOLIN SODIUM-DEXTROSE 2-4 GM/100ML-% IV SOLN
2.0000 g | Freq: Three times a day (TID) | INTRAVENOUS | Status: AC
  Administered 2022-06-26 – 2022-06-27 (×2): 2 g via INTRAVENOUS
  Filled 2022-06-26 (×2): qty 100

## 2022-06-26 MED ORDER — GABAPENTIN 100 MG PO CAPS: 100.0000 mg | ORAL_CAPSULE | ORAL | Status: DC

## 2022-06-26 MED ORDER — EPHEDRINE SULFATE-NACL 50-0.9 MG/10ML-% IV SOSY
PREFILLED_SYRINGE | INTRAVENOUS | Status: DC | PRN
  Administered 2022-06-26: 15 mg via INTRAVENOUS
  Administered 2022-06-26: 10 mg via INTRAVENOUS
  Administered 2022-06-26: 15 mg via INTRAVENOUS
  Administered 2022-06-26: 10 mg via INTRAVENOUS

## 2022-06-26 MED ORDER — SODIUM CHLORIDE 0.9% FLUSH
3.0000 mL | Freq: Two times a day (BID) | INTRAVENOUS | Status: DC
  Administered 2022-06-26 – 2022-06-27 (×2): 3 mL via INTRAVENOUS

## 2022-06-26 MED ORDER — PHENYLEPHRINE 80 MCG/ML (10ML) SYRINGE FOR IV PUSH (FOR BLOOD PRESSURE SUPPORT)
PREFILLED_SYRINGE | INTRAVENOUS | Status: DC | PRN
  Administered 2022-06-26: 80 ug via INTRAVENOUS
  Administered 2022-06-26: 160 ug via INTRAVENOUS
  Administered 2022-06-26: 80 ug via INTRAVENOUS

## 2022-06-26 MED ORDER — HEPARIN 6000 UNIT IRRIGATION SOLUTION
Status: DC | PRN
  Administered 2022-06-26: 1

## 2022-06-26 MED ORDER — LIDOCAINE HCL 1 % IJ SOLN
INTRAMUSCULAR | Status: AC
  Filled 2022-06-26: qty 20

## 2022-06-26 SURGICAL SUPPLY — 67 items
BAG COUNTER SPONGE SURGICOUNT (BAG) ×3 IMPLANT
BAG SNAP BAND KOVER 36X36 (MISCELLANEOUS) IMPLANT
BALLN TRUE 18X4.5 (BALLOONS) ×3
BALLOON TRUE 18X4.5 (BALLOONS) IMPLANT
BLADE CLIPPER SURG (BLADE) IMPLANT
BLADE OSCILLATING /SAGITTAL (BLADE) IMPLANT
BLADE STERNUM SYSTEM 6 (BLADE) IMPLANT
CABLE ADAPT CONN TEMP 6FT (ADAPTER) ×3 IMPLANT
CABLE ADAPT PACING TEMP 12FT (ADAPTER) IMPLANT
CATH DIAG EXPO 6F AL1 (CATHETERS) IMPLANT
CATH DIAG EXPO 6F VENT PIG 145 (CATHETERS) ×6 IMPLANT
CATH INFINITI 6F AL2 (CATHETERS) IMPLANT
CATH S G BIP PACING (CATHETERS) ×3 IMPLANT
CHLORAPREP W/TINT 26 (MISCELLANEOUS) ×3 IMPLANT
CLOSURE MYNX CONTROL 6F/7F (Vascular Products) IMPLANT
CLOSURE PERCLOSE PROSTYLE (VASCULAR PRODUCTS) IMPLANT
CNTNR URN SCR LID CUP LEK RST (MISCELLANEOUS) ×6 IMPLANT
CONT SPEC 4OZ STRL OR WHT (MISCELLANEOUS) ×6
COVER BACK TABLE 80X110 HD (DRAPES) ×3 IMPLANT
DERMABOND ADVANCED .7 DNX12 (GAUZE/BANDAGES/DRESSINGS) ×3 IMPLANT
DRSG TEGADERM 4X4.5 CHG (GAUZE/BANDAGES/DRESSINGS) IMPLANT
DRSG TEGADERM 4X4.75 (GAUZE/BANDAGES/DRESSINGS) ×6 IMPLANT
ELECT REM PT RETURN 9FT ADLT (ELECTROSURGICAL) ×3
ELECTRODE REM PT RTRN 9FT ADLT (ELECTROSURGICAL) ×3 IMPLANT
GAUZE 4X4 16PLY ~~LOC~~+RFID DBL (SPONGE) IMPLANT
GAUZE SPONGE 4X4 12PLY STRL (GAUZE/BANDAGES/DRESSINGS) ×3 IMPLANT
GLOVE BIO SURGEON STRL SZ7.5 (GLOVE) ×3 IMPLANT
GLOVE BIOGEL PI IND STRL 6.5 (GLOVE) IMPLANT
GLOVE ECLIPSE 7.0 STRL STRAW (GLOVE) ×3 IMPLANT
GOWN STRL REUS W/ TWL LRG LVL3 (GOWN DISPOSABLE) IMPLANT
GOWN STRL REUS W/ TWL XL LVL3 (GOWN DISPOSABLE) ×3 IMPLANT
GOWN STRL REUS W/TWL LRG LVL3 (GOWN DISPOSABLE)
GOWN STRL REUS W/TWL XL LVL3 (GOWN DISPOSABLE) ×3
KIT BASIN OR (CUSTOM PROCEDURE TRAY) ×3 IMPLANT
KIT HEART LEFT (KITS) ×3 IMPLANT
KIT SAPIAN 3 ULTRA RESILIA 23 (Valve) IMPLANT
KIT TURNOVER KIT B (KITS) ×3 IMPLANT
NS IRRIG 1000ML POUR BTL (IV SOLUTION) ×3 IMPLANT
PACK ENDO MINOR (CUSTOM PROCEDURE TRAY) ×3 IMPLANT
PAD ARMBOARD 7.5X6 YLW CONV (MISCELLANEOUS) ×6 IMPLANT
PAD ELECT DEFIB RADIOL ZOLL (MISCELLANEOUS) ×3 IMPLANT
POSITIONER HEAD DONUT 9IN (MISCELLANEOUS) ×3 IMPLANT
SET MICROPUNCTURE 5F STIFF (MISCELLANEOUS) ×3 IMPLANT
SHEATH BRITE TIP 7FR 35CM (SHEATH) ×3 IMPLANT
SHEATH BRITE TIP 7FRX11 (SHEATH) IMPLANT
SHEATH PINNACLE 6F 10CM (SHEATH) ×3 IMPLANT
SHEATH PINNACLE 8F 10CM (SHEATH) ×3 IMPLANT
SLEEVE REPOSITIONING LENGTH 30 (MISCELLANEOUS) ×3 IMPLANT
SPIKE FLUID TRANSFER (MISCELLANEOUS) ×3 IMPLANT
SPONGE T-LAP 18X18 ~~LOC~~+RFID (SPONGE) ×3 IMPLANT
STOPCOCK MORSE 400PSI 3WAY (MISCELLANEOUS) ×6 IMPLANT
SUT PROLENE 6 0 C 1 30 (SUTURE) IMPLANT
SUT SILK  1 MH (SUTURE) ×3
SUT SILK 1 MH (SUTURE) ×3 IMPLANT
SYR 20CC LL (SYRINGE) IMPLANT
SYR 50ML LL SCALE MARK (SYRINGE) ×3 IMPLANT
SYR BULB IRRIG 60ML STRL (SYRINGE) IMPLANT
TOWEL GREEN STERILE (TOWEL DISPOSABLE) ×6 IMPLANT
TRANSDUCER W/STOPCOCK (MISCELLANEOUS) ×6 IMPLANT
TUBING ART PRESS 72  MALE/FEM (TUBING) ×3
TUBING ART PRESS 72 MALE/FEM (TUBING) IMPLANT
TUBING CIL FLEX 10 FLL-RA (TUBING) IMPLANT
WIRE AMPLATZ SS-J .035X180CM (WIRE) IMPLANT
WIRE EMERALD 3MM-J .035X150CM (WIRE) ×3 IMPLANT
WIRE EMERALD 3MM-J .035X260CM (WIRE) ×3 IMPLANT
WIRE EMERALD ST .035X260CM (WIRE) ×6 IMPLANT
WIRE SAFARI SM CURVE 275 (WIRE) IMPLANT

## 2022-06-26 NOTE — Transfer of Care (Signed)
Immediate Anesthesia Transfer of Care Note  Patient: Yvonne [name]  Procedure(s) Performed: Montel Culver AORTIC VALVE REPLACEMENT, TRANSFEMORAL (Chest) INTRAOPERATIVE TRANSTHORACIC ECHOCARDIOGRAM ULTRASOUND GUIDANCE FOR VASCULAR ACCESS (Bilateral: Groin)  Patient Location: Cath Lab  Anesthesia Type:MAC  Level of Consciousness: awake and oriented  Airway & Oxygen Therapy: Patient Spontanous Breathing and Patient connected to nasal cannula oxygen  Post-op Assessment: Report given to RN  Post vital signs: Reviewed and stable  Last Vitals:  Vitals Value Taken Time  BP 108/46   Temp    Pulse 74 06/26/22 1107  Resp 13 06/26/22 1107  SpO2 91 % 06/26/22 1107  Vitals shown include unvalidated device data.  Last Pain:  Vitals:   06/26/22 0736  TempSrc:   PainSc: 2          Complications: No notable events documented.

## 2022-06-26 NOTE — Op Note (Signed)
[facility]   MULTIDISCIPLINARY HEART VALVE TEAM   TAVR OPERATIVE NOTE   Date of Procedure:  06/26/2022  Preoperative Diagnosis: Severe Aortic Stenosis   Postoperative Diagnosis: Same   Procedure:  Balloon aortic valvuloplasty using an 18 mm balloon.  Transcatheter Aortic Valve Replacement - Percutaneous Right Transfemoral Approach  Edwards Sapien 3 Ultra Resilia THV (size 22 mm, model # 9755RSL, serial # [id number])   Co-Surgeons:  [name], MD and Lauree Chandler, MD   Anesthesiologist:  Annye Asa, MD  Echocardiographer:  Marry Guan, mD  Pre-operative Echo Findings: Severe aortic stenosis Normal left ventricular systolic function  Post-operative Echo Findings: No paravalvular leak Normal left ventricular systolic function   BRIEF CLINICAL NOTE AND INDICATIONS FOR SURGERY  This 82 year old woman has stage D, severe, symptomatic aortic stenosis with NYHA class II symptoms of exertional fatigue and shortness of breath consistent with chronic diastolic congestive heart failure.  I personally reviewed her 2D echocardiogram, cardiac catheterization, and CTA studies.  Her echocardiogram shows a severely calcified trileaflet aortic valve with restricted leaflet mobility.  The mean gradient is 38 mmHg with a peak gradient of 67 mmHg and a valve area of 0.97 cm consistent with severe aortic stenosis.  There is mild aortic insufficiency.  Left ventricular ejection fraction is normal.  Cardiac catheterization showed mild nonobstructive coronary disease with normal right heart pressures.  I agree that aortic valve replacement is indicated in this patient for relief of her symptoms and to prevent left ventricular dysfunction.  Given her advanced age and comorbidities I think that transcatheter aortic valve replacement would be the best treatment option for her.  Her gated cardiac CTA shows anatomy suitable for TAVR using a 23 mm Edwards SAPIEN 3 valve.  There  is significant asymmetric hypertrophy of the basal septum but I do not think that will cause a problem for implanting a SAPIEN 3 valve.  Her abdominal and pelvic CTA shows adequate pelvic vascular anatomy to allow transfemoral insertion.  Electrocardiogram shows sinus rhythm with incomplete right bundle branch block and left anterior fascicular block which will increase her risk of needing a permanent pacemaker postoperatively.   The patient and her friend were counseled at length regarding treatment alternatives for management of severe symptomatic aortic stenosis. The risks and benefits of surgical intervention has been discussed in detail. Long-term prognosis with medical therapy was discussed. Alternative approaches such as conventional surgical aortic valve replacement, transcatheter aortic valve replacement, and palliative medical therapy were compared and contrasted at length. This discussion was placed in the context of the patient's own specific clinical presentation and past medical history. All of their questions have been addressed.    Following the decision to proceed with transcatheter aortic valve replacement, a discussion was held regarding what types of management strategies would be attempted intraoperatively in the event of life-threatening complications, including whether or not the patient would be considered a candidate for the use of cardiopulmonary bypass and/or conversion to open sternotomy for attempted surgical intervention.  Given her advanced age and comorbidities I do not think she is a candidate for emergent sternotomy to manage any intraoperative complications.  The patient is aware of the fact that transient use of cardiopulmonary bypass may be necessary. The patient has been advised of a variety of complications that might develop including but not limited to risks of death, stroke, paravalvular leak, aortic dissection or other major vascular complications, aortic annulus  rupture, device embolization, cardiac rupture or perforation, mitral regurgitation, acute  myocardial infarction, arrhythmia, heart block or bradycardia requiring permanent pacemaker placement, congestive heart failure, respiratory failure, renal failure, pneumonia, infection, other late complications related to structural valve deterioration or migration, or other complications that might ultimately cause a temporary or permanent loss of functional independence or other long term morbidity. The patient provides full informed consent for the procedure as described and all questions were answered.     DETAILS OF THE OPERATIVE PROCEDURE  PREPARATION:    The patient was brought to the operating room on the above mentioned date and appropriate monitoring was established by the anesthesia team. The patient was placed in the supine position on the operating table.  Intravenous antibiotics were administered. The patient was monitored closely throughout the procedure under conscious sedation.    Baseline transthoracic echocardiogram was performed. The patient's abdomen and both groins were prepped and draped in a sterile manner. A time out procedure was performed.   PERIPHERAL ACCESS:    Using the modified Seldinger technique, femoral arterial and venous access was obtained with placement of 6 Fr sheaths on the left side.  A pigtail diagnostic catheter was passed through the left arterial sheath under fluoroscopic guidance into the aortic root.  A temporary transvenous pacemaker catheter was passed through the left femoral venous sheath under fluoroscopic guidance into the right ventricle.  The pacemaker was tested to ensure stable lead placement and pacemaker capture. Aortic root angiography was performed in order to determine the optimal angiographic angle for valve deployment.   TRANSFEMORAL ACCESS:   Percutaneous transfemoral access and sheath placement was performed using ultrasound guidance.  The  right common femoral artery was cannulated using a micropuncture needle and appropriate location was verified using hand injection angiogram.  A pair of Abbott Perclose percutaneous closure devices were placed and a 6 French sheath replaced into the femoral artery.  The patient was heparinized systemically and ACT verified > 250 seconds.    A 14 Fr transfemoral E-sheath was introduced into the right common femoral artery after progressively dilating over an Amplatz superstiff wire. An AL-1 catheter was used to direct a straight-tip exchange length wire across the native aortic valve into the left ventricle. This was exchanged out for a pigtail catheter and position was confirmed in the LV apex. Simultaneous LV and Ao pressures were recorded.  The pigtail catheter was exchanged for a Safari wire in the LV apex.   BALLOON AORTIC VALVULOPLASTY:   Balloon aortic valvuloplasty was performed using a 18 mm valvuloplasty balloon.  Once optimal position was achieved, BAV was done under rapid ventricular pacing. The patient recovered well hemodynamically.    TRANSCATHETER HEART VALVE DEPLOYMENT:   An Edwards Sapien 3 Ultra transcatheter heart valve (size 23 mm) was prepared and crimped per manufacturer's guidelines, and the proper orientation of the valve is confirmed on the [employer] delivery system. The valve was advanced through the introducer sheath using normal technique until in an appropriate position in the abdominal aorta beyond the sheath tip. The balloon was then retracted and using the fine-tuning wheel was centered on the valve. The valve was then advanced across the aortic arch using appropriate flexion of the catheter. The valve was carefully positioned across the aortic valve annulus. The Commander catheter was retracted using normal technique. Once final position of the valve has been confirmed by angiographic assessment, the valve is deployed during rapid ventricular pacing to maintain  systolic blood pressure < 50 mmHg and pulse pressure < 10 mmHg. The balloon inflation is held  for >3 seconds after reaching full deployment volume. Once the balloon has fully deflated the balloon is retracted into the ascending aorta and valve function is assessed using echocardiography. There is felt to be no paravalvular leak and no central aortic insufficiency.  The patient's hemodynamic recovery following valve deployment is good.  The deployment balloon and guidewire are both removed.    PROCEDURE COMPLETION:   The sheath was removed and femoral artery closure performed.  Protamine was administered once femoral arterial repair was complete. The temporary pacemaker, pigtail catheter and femoral sheaths were removed with manual pressure used for venous hemostasis.  A Mynx femoral closure device was utilized following removal of the diagnostic sheath in the left femoral artery.  The patient tolerated the procedure well and is transported to the cath lab recovery area in stable condition. There were no immediate intraoperative complications. All sponge instrument and needle counts are verified correct at completion of the operation.   No blood products were administered during the operation.  The patient received a total of 60 mL of intravenous contrast during the procedure.   [name], MD 06/26/2022

## 2022-06-26 NOTE — Interval H&P Note (Signed)
History and Physical Interval Note:  06/26/2022 6:31 AM  Yvonne [name]  has presented today for surgery, with the diagnosis of Severe Aortic Stenosis.  The various methods of treatment have been discussed with the patient and family. After consideration of risks, benefits and other options for treatment, the patient has consented to  Procedure(s): Transcatheter Aortic Valve Replacement, Transfemoral (N/A) INTRAOPERATIVE TRANSTHORACIC ECHOCARDIOGRAM (N/A) as a surgical intervention.  The patient's history has been reviewed, patient examined, no change in status, stable for surgery.  I have reviewed the patient's chart and labs.  Questions were answered to the patient's satisfaction.     [name]

## 2022-06-26 NOTE — Discharge Instructions (Signed)

## 2022-06-26 NOTE — Anesthesia Procedure Notes (Signed)
Arterial Line Insertion Start/End1/02/2023 8:10 AM, 06/26/2022 8:15 AM Performed by: Barrington Ellison, CRNA, CRNA  Patient location: Pre-op. Preanesthetic checklist: patient identified, risks and benefits discussed and pre-op evaluation Lidocaine 1% used for infiltration Right, radial was placed Catheter size: 20 G Hand hygiene performed   Attempts: 1 Procedure performed without using ultrasound guided technique. Following insertion, dressing applied and Biopatch. Post procedure assessment: unchanged  Patient tolerated the procedure well with no immediate complications.

## 2022-06-26 NOTE — Discharge Summary (Addendum)
[facility] VALVE TEAM  Discharge Summary    Patient ID: Yvonne [name] MRN: [id number]; DOB: 01-04-1941  Admit date: 06/26/2022 Discharge date: 06/27/2022  Primary Care Provider: Martyn Malay, MD  Primary Cardiologist: Mertie Moores, MD / Dr. Angelena Form & Dr. Cyndia Bent (TAVR)  Discharge Diagnoses    Principal Problem:   S/P TAVR (transcatheter aortic valve replacement) Active Problems:   Hypothyroidism   Essential hypertension   Severe aortic stenosis   Osteopenia   Memory impairment   Controlled type 2 diabetes mellitus with chronic kidney disease, without long-term current use of insulin (HCC)   Aneurysm of thoracic aorta (HCC)   Allergies No Known Allergies  Diagnostic Studies/Procedures    TAVR OPERATIVE NOTE     Date of Procedure:                06/26/2022   Preoperative Diagnosis:      Severe Aortic Stenosis    Postoperative Diagnosis:    Same    Procedure:        Transcatheter Aortic Valve Replacement - Transfemoral Approach             Edwards Sapien 3 THV (size 23 mm, model # [id number] serial # [id number] )              Co-Surgeons:                        Lauree Chandler, MD and Gilford Raid , MD    Anesthesiologist:                  Ola Spurr   Echocardiographer:              O'Neal   Pre-operative Echo Findings: Severe aortic stenosis Normal left ventricular systolic function   Post-operative Echo Findings: No paravalvular leak Normal left ventricular systolic function _____________    Echo 06/27/22: completed but pending formal read at the time of discharge   History of Present Illness     Yvonne [name] is a 82 y.o. 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   She had a stroke at age 28 and is known to have a chronic left internal carotid artery occlusion. She has had right sided arm and leg weakness since her stroke and has neuropathy in both feet. She had a craniotomy in her 46s with removal of a benign tumor.    Her aortic stenosis has been moderate and followed by Dr. Acie Fredrickson for several years. 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 Angelena Form in October 2021. Her AS was felt to be moderate and she was asymptomatic. Continued surveillance was recommended. She has been followed every 6 months with serial echos/OVs by our team. She was last seen in the office in 04/2022 and was feeling unwell. Echo 05/04/22 showed EF 70%, severe asymmetric LVH, and severe AS with mean grad 37.6 mmHg, peak grad 66.6 mmHg, AVA 0.83 cm2, DVI 0.25, as well as moderate MAC with mild MS. L/RHC 05/17/22 showed mild non-obstructive CAD and normal right heart pressures. (PCWP 13 mmHg). Pre TAVR CTs showed severe AS with suitable anatomy  for TAVR as well as severe asymmetric hypertrophy of the basal septum up to 20 mm.   She was evaluated by the multidisciplinary valve team and felt to have severe, symptomatic aortic stenosis and to be a suitable candidate for TAVR, which was set up for 06/26/22. Of note, PAT UA showed many bacteria, >50 WBC, + nitrite suggesting possible UTI. She was treated with Bactrim DS.  Hospital Course     Consultants: none   Severe AS: s/p successful TAVR with a 23 mm Edwards Sapien 3 Ultra Resilia THV via the TF approach on 06/26/22. Post operative echo completed but pending formal read. Groin sites are stable. Continued on home Asprin 2m daily. She walked with cardiac rehab with no issues. Plan to discharge home with close follow up in the office next week.   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  Will discharge with a Zio AT to rule out delayed bradyarrhythmias.  HTN: BP moderately elevated today. Resume home meds now and follow.   UTI: as above  PAT UA showed many bacteria, >50 WBC, + nitrite suggesting possible UTI. She was treated with Bactrim DS. She complained of urinary frequency during admission and a urine culture has been sent. Will follow up on this and treat if indicated.  Hx of CVA: continue on aspirin and statin   Pulmonary nodules: pre TAVR CT showed "two solid pulmonary nodules, largest 0.5 cm. Recommend attention on follow-up chest CT in 3 months given the history of malignancy and absence of prior scans." This will be discussed in the outpatient setting.   Thyroid nodules: pre TAVR CT showed a "hypodense bilateral thyroid nodules, largest 1.7 cm on the right. Recommend thyroid [place]" This will be discussed in the outpatient setting.  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 Recommend annual imaging followup by CTA or MRA". This will be discussed in the outpatient setting.  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 Will refer to Dr. MDe Burrs This will be discussed in the outpatient setting.  _____________  Discharge Vitals Blood pressure (!) 152/62, pulse 73, temperature 99.2 F (37.3 C), temperature source Oral, resp. rate 18, height _0  (1.549 m), weight 74.3 kg, SpO2 93 %.  Filed Weights   06/25/22 1300 06/26/22 0652 06/27/22 0358  Weight: 73.5 kg 68 kg 74.3 kg     Labs & Radiologic Studies    CBC Recent Labs    06/26/22 1143 06/27/22 0609  WBC  --  12.3*  HGB 10.9* 11.8*  HCT 32.0* 36.6  MCV  --  93.6  PLT  --  2093  Basic Metabolic Panel Recent Labs    06/26/22 1143 06/27/22 0609  NA 141 135  K 3.7 3.7  CL 106 101  CO2  --  25  GLUCOSE 189* 144*  BUN 16 11  CREATININE 0.80 0.78  CALCIUM  --  8.3*  MG  --  1.7    Liver Function Tests No results for input(s): "AST", "ALT", "ALKPHOS", "BILITOT", "PROT", "ALBUMIN" in the last 72 hours. No results for input(s): "LIPASE", "AMYLASE" in the last 72 hours. Cardiac Enzymes No results for input(s): "CKTOTAL", "CKMB", "CKMBINDEX", "TROPONINI" in the last 72 hours. BNP Invalid input(s): "POCBNP" D-Dimer No results for input(s): "DDIMER" in the last 72 hours. Hemoglobin A1C No results for input(s): "  HGBA1C" in the last 72 hours. Fasting Lipid Panel No results for input(s): "CHOL", "HDL", "LDLCALC", "TRIG", "CHOLHDL", "LDLDIRECT" in the last 72 hours. Thyroid Function Tests No results for input(s): "TSH", "T4TOTAL", "T3FREE", "THYROIDAB" in the last 72 hours.  Invalid input(s): "FREET3" _____________  ECHOCARDIOGRAM LIMITED  Result Date: 06/26/2022    ECHOCARDIOGRAM LIMITED REPORT   Patient Name:   Yvonne [name] Date of Exam: 06/26/2022 Medical Rec #:  [id number]      Height:       61.0 in Accession #:    [id number]     Weight:       150.0 lb Date of Birth:  Nov 18, 1940     BSA:          1.671 m Patient Age:    80 years       BP:           191/82 mmHg Patient Gender: F              HR:           73 bpm. Exam Location:  Inpatient Procedure: Limited Echo, Cardiac Doppler and Color Doppler Indications:    Aortic stenosis I35.0  History:        Patient has prior history of Echocardiogram examinations, most                 recent 05/04/2022. Stroke, Aortic Valve Disease,                 Signs/Symptoms:Murmur; Risk Factors:Hypertension.                 Aortic Valve: 23 mm Sapien prosthetic, stented (TAVR) valve is                 present in the aortic position. Procedure Date: 06/26/2022.  Sonographer:    Darlina Sicilian RDCS Referring Phys: [id number] [place]  1. Echo guided TAVR. 23 mm S3. Vmax 1.7 m/s, MG 7 mmHG, EOA 3.01 cm2, DI 0.79. No regurgitation or paravalvular leak. The aortic valve has been repaired/replaced. There is severe calcifcation of  the aortic valve. Aortic valve regurgitation is mild. Severe aortic valve stenosis. There is a 23 mm Sapien prosthetic (TAVR) valve present in the aortic position. Procedure Date: 06/26/2022.  2. Left ventricular ejection fraction, by estimation, is 65 to 70%. The left ventricle has normal function.  3. Right ventricular systolic function is normal. The right ventricular size is normal.  4. Left atrial size was mildly dilated.  5. The mitral valve is degenerative. No evidence of mitral valve regurgitation. No evidence of mitral stenosis. The mean mitral valve gradient is 2.0 mmHg. Moderate mitral annular calcification. FINDINGS  Left Ventricle: Left ventricular ejection fraction, by estimation, is 65 to 70%. The left ventricle has normal function. Right Ventricle: The right ventricular size is normal. No increase in right ventricular wall thickness. Right ventricular systolic function is normal. Left Atrium: Left atrial size was mildly dilated. Right Atrium: Right atrial size was not well visualized. Pericardium: Trivial pericardial effusion is present. Mitral Valve: The mitral valve is degenerative in appearance. Moderate mitral annular calcification. No evidence of mitral valve stenosis. MV peak gradient, 8.9 mmHg. The mean mitral valve gradient is 2.0 mmHg. Aortic Valve: Echo guided TAVR. 23 mm S3. Vmax 1.7 m/s, MG 7 mmHG, EOA 3.01 cm2, DI 0.79. No regurgitation or paravalvular leak. The aortic valve has been repaired/replaced. There is severe calcifcation of the aortic valve. Aortic valve regurgitation is  mild. Severe aortic stenosis is present. Aortic valve mean gradient measures 7.0 mmHg. Aortic valve peak gradient measures 12.4 mmHg. Aortic valve area, by VTI measures 3.01 cm. There is a 23 mm Sapien prosthetic, stented (TAVR) valve present in the aortic position. Procedure Date: 06/26/2022. Additional Comments: Spectral Doppler performed. Color Doppler performed.  LEFT VENTRICLE PLAX 2D LVOT diam:     2.20  cm LV SV:         108 LV SV Index:   64 LVOT Area:     3.80 cm  AORTIC VALVE AV Area (Vmax):    3.15 cm AV Area (Vmean):   3.55 cm AV Area (VTI):     3.01 cm AV Vmax:           176.00 cm/s AV Vmean:          120.000 cm/s AV VTI:            0.357 m AV Peak Grad:      12.4 mmHg AV Mean Grad:      7.0 mmHg LVOT Vmax:         146.00 cm/s LVOT Vmean:        112.000 cm/s LVOT VTI:          0.283 m LVOT/AV VTI ratio: 0.79 MITRAL VALVE MV Area (plan): 5.21 cm   SHUNTS MV Area VTI:    2.30 cm   Systemic VTI:  0.28 m MV Peak grad:   8.9 mmHg   Systemic Diam: 2.20 cm MV Mean grad:   2.0 mmHg MV Vmax:        1.49 m/s MV Vmean:       71.3 cm/s Eleonore Chiquito MD Electronically signed by Eleonore Chiquito MD Signature Date/Time: 06/26/2022/4:08:16 PM    Final    Structural Heart Procedure  Result Date: 06/26/2022 See surgical note for result.  DG Chest 2 View  Result Date: 06/23/2022 CLINICAL DATA:  82 year old female with preoperative chest x-ray EXAM: CHEST - 2 VIEW COMPARISON:  None Available. FINDINGS: Cardiomediastinal silhouette borderline enlarged. Low lung volumes accentuate the central vascular structures. No interlobular septal thickening. No confluent airspace disease. Degenerative changes of the shoulders No pneumothorax.  No pleural effusion. Degenerative changes spine.  No displaced fracture. IMPRESSION: Negative for acute cardiopulmonary disease Borderline cardiomegaly Electronically Signed   By: Corrie Mckusick D.O.   On: 06/23/2022 09:07   CT CORONARY MORPH W/CTA COR W/SCORE W/CA W/CM &/OR WO/CM  Addendum Date: 06/05/2022   ADDENDUM REPORT: 06/05/2022 10:38 CLINICAL DATA:  Severe Aortic Stenosis. EXAM: Cardiac TAVR CT TECHNIQUE: A non-contrast, gated CT scan was obtained with axial slices of 3 mm through the heart for aortic valve calcium scoring. A 120 kV retrospective, gated, contrast cardiac scan was obtained. Gantry rotation speed was 250 msecs and collimation was 0.6 mm. Nitroglycerin was not given.  The 3D data set was reconstructed in 5% intervals of the 0-95% of the R-R cycle. Systolic and diastolic phases were analyzed on a dedicated workstation using MPR, MIP, and VRT modes. The patient received 100 cc of contrast. FINDINGS: Image quality: Excellent. Noise artifact is: Limited. Valve Morphology: Tricuspid aortic valve with severe diffuse calcifications. Severely restricted leaflet motion in systole. Aortic Valve Calcium score: 2026 Aortic annular dimension: Phase assessed: 25% Annular area: 415 mm2 Annular perimeter: 73.6 mm Max diameter: 25.2 mm Min diameter: 22.3 mm Annular and subannular calcification: None. Membranous septum length: 8.2 mm Optimal coplanar projection: LAO 19 CRA 1 Coronary Artery Height above Annulus: Left  Main: 16.7 mm Right Coronary: 17.4 mm Sinus of Valsalva Measurements: Non-coronary: 31 mm Right-coronary: 31 mm Left-coronary: 32 mm Sinus of Valsalva Height: Non-coronary: 24.6 mm Right-coronary: 21.5 mm Left-coronary: 22.7 mm Sinotubular Junction: 30 mm Ascending Thoracic Aorta: Mild dilation of the ascending aorta, 40 mm. Coronary Arteries: Normal coronary origin. Right dominance. The study was performed without use of NTG and is insufficient for plaque evaluation. Please refer to recent cardiac catheterization for coronary assessment. Cardiac Morphology: Right Atrium: Right atrial size is within normal limits. Right Ventricle: The right ventricular cavity is within normal limits. Left Atrium: Left atrial size is normal in size with no left atrial appendage filling defect. 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 Left Ventricle: The ventricular cavity size is within normal limits. Severe asymmetric hypertrophy of the basal septum up to 20 mm. Small diverticulum of the ventricular apex. Pulmonary arteries: Normal in size without proximal filling defect. Pulmonary veins: Normal pulmonary venous drainage. Pericardium: Normal thickness with no significant effusion or calcium present. Mitral Valve:  The mitral valve is normal structure without significant calcification. Extra-cardiac findings: See attached radiology report for non-cardiac structures. IMPRESSION: 1. Annular measurements support a 23 mm S3 or 29 mm Evolut Pro. 2. No significant annular or subannular calcifications. 3. Sufficient coronary to annulus distance. 4. Optimal Fluoroscopic Angle for Delivery: LAO 19 CRA 1 5. Mild dilation of the ascending aorta up to 40 mm. 6. Severe asymmetric hypertrophy of the basal septum up to 20 mm. 7. Small diverticulum of the ventricular apex. 8. 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 [place] T. Audie Box, MD Electronically Signed   By: Eleonore Chiquito M.D.   On: 06/05/2022 10:38   Result Date: 06/05/2022 EXAM: OVER-READ INTERPRETATION  CT CHEST The following report is a limited chest CT over-read performed by radiologist Dr. Salvatore Marvel of [facility], [place] on 06/04/2022. This over-read does not include interpretation of cardiac or coronary anatomy or pathology. The cardiac CTA interpretation by the cardiologist is attached. COMPARISON:  None Available. FINDINGS: Please see the separate concurrent chest CT angiogram report for details. IMPRESSION: Please see the separate concurrent chest CT angiogram report for details. Electronically Signed: By: Ilona Sorrel M.D. On: 06/04/2022 11:42   CT ANGIO ABDOMEN PELVIS  W &/OR WO CONTRAST  Result Date: 06/04/2022 CLINICAL DATA:  Aortic valve replacement (TAVR), pre-op eval. Non rheumatic aortic valve stenosis. History of left breast cancer. * Tracking Code: BO * EXAM: CT ANGIOGRAPHY CHEST, ABDOMEN AND PELVIS TECHNIQUE: Multidetector CT imaging through the chest, abdomen and pelvis was performed using the standard protocol during bolus administration of intravenous contrast. Multiplanar reconstructed images and MIPs were obtained and reviewed to evaluate the vascular anatomy. RADIATION DOSE REDUCTION: This exam was performed according to the departmental dose-optimization  program which includes automated exposure control, adjustment of the mA and/or kV according to patient size and/or use of iterative reconstruction technique. CONTRAST:  86m OMNIPAQUE IOHEXOL 350 MG/ML SOLN COMPARISON:  None Available. FINDINGS: CTA CHEST FINDINGS Cardiovascular: Mild cardiomegaly. Diffuse thickening and coarse calcification of the aortic valve. No significant pericardial effusion/thickening. Atherosclerotic thoracic aorta with dilated 4.1 cm ascending thoracic aorta. Normal caliber pulmonary arteries. No central pulmonary emboli. Mediastinum/Nodes: Hypodense bilateral thyroid nodules, largest 1.7 cm on the right. Unremarkable esophagus. Surgical clips in the left axilla. No axillary adenopathy. Lungs/Pleura: No pneumothorax. No pleural effusion. Solid 0.5 cm medial basilar right lower lobe pulmonary nodule (series 5/image 87). Solid 0.5 cm posterior left upper lobe pulmonary nodule (series 5/image 69).  No acute consolidative airspace disease. Musculoskeletal: No aggressive appearing focal osseous lesions. Moderate thoracic spondylosis. Surgical clips in the left breast. Asymmetric left breast skin thickening. CTA ABDOMEN AND PELVIS FINDINGS Hepatobiliary: Normal liver with no liver mass. Normal gallbladder with no radiopaque cholelithiasis. No biliary ductal dilatation. Pancreas: Normal, with no mass or duct dilation. Spleen: Normal size. No mass. Adrenals/Urinary Tract: Normal adrenals. No contour deforming renal masses. No hydronephrosis. Scattered subcentimeter hypodense bilateral renal cortical lesions are too small to characterize, for which no follow-up imaging is recommended. Collapsed bladder is obscured by streak artifact from left hip hardware and grossly normal. Stomach/Bowel: Small hiatal hernia. Otherwise normal nondistended stomach. Normal caliber small bowel with no small bowel wall thickening. Appendix not discretely visualized. Normal large bowel with no diverticulosis, large bowel  wall thickening or pericolonic fat stranding. Vascular/Lymphatic: Atherosclerotic nonaneurysmal abdominal aorta. Patent portal, splenic and renal veins. No pathologically enlarged lymph nodes in the abdomen or pelvis. Reproductive: Status post hysterectomy, with no abnormal findings at the vaginal cuff. No adnexal mass. Other: No pneumoperitoneum, ascites or focal fluid collection. Large fat density 14.3 x 6.2 x 16.5 cm right gluteal intramuscular mass (series 4/image 185). Musculoskeletal: No aggressive appearing focal osseous lesions. Left total hip arthroplasty. Marked lumbar degenerative disc disease. Bilateral posterior spinal fusion at L4-5. VASCULAR MEASUREMENTS PERTINENT TO TAVR: AORTA: Minimal Aortic Diameter-14.7 x 13.3 mm Severity of Aortic Calcification-moderate RIGHT PELVIS: Right Common Iliac Artery - Minimal Diameter-10.2 x 9.2 mm Tortuosity-mild Calcification-mild Right External Iliac Artery - Minimal Diameter-7.6 x 7.3 mm Tortuosity-mild Calcification-none Right Common Femoral Artery - Minimal Diameter-8.7 x 7.3 mm Tortuosity-mild Calcification-mild LEFT PELVIS: Left Common Iliac Artery - Minimal Diameter-10.9 x 9.3 mm Tortuosity-mild Calcification-mild Left External Iliac Artery - Minimal Diameter-7.8 x 7.4 mm Tortuosity-mild Calcification-none Left Common Femoral Artery - Minimal Diameter-8.0 x 7.2 mm Tortuosity-mild Calcification-mild Review of the MIP images confirms the above findings. IMPRESSION: 1. Vascular findings and measurements pertinent to potential TAVR procedure, as detailed. 2. Diffuse thickening and coarse calcification of the aortic valve, compatible with the reported history of aortic stenosis. 3. Mild cardiomegaly. 4. Two solid pulmonary nodules, largest 0.5 cm. Recommend attention on follow-up chest CT in 3 months given the history of malignancy and absence of prior scans. 5. Hypodense bilateral thyroid nodules, largest 1.7 cm on the right. Recommend thyroid US (ref: J Am Coll  Radiol. 2015 Feb;12(2): 143-50). 6. 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 Recommend annual imaging followup by CTA or MRA. This recommendation follows 2010 ACCF/AHA/AATS/ACR/ASA/SCA/SCAI/SIR/STS/SVM Guidelines for the Diagnosis and Management of Patients with Thoracic Aortic Disease. Circulation. 2010; 121: Z610-R604. Aortic aneurysm NOS (ICD10-I71.9). 7. Large fat density 14.3 x 6.2 x 16.5 cm right gluteal intramuscular mass, most compatible with a lipoma. 8. Small hiatal hernia. 9.  Aortic Atherosclerosis (ICD10-I70.0). Electronically Signed   By: Ilona Sorrel M.D.   On: 06/04/2022 12:18   CT ANGIO CHEST AORTA W/CM & OR WO/CM  Result Date: 06/04/2022 CLINICAL DATA:  Aortic valve replacement (TAVR), pre-op eval. Non rheumatic aortic valve stenosis. History of left breast cancer. * Tracking Code: BO * EXAM: CT ANGIOGRAPHY CHEST, ABDOMEN AND PELVIS TECHNIQUE: Multidetector CT imaging through the chest, abdomen and pelvis was performed using the standard protocol during bolus administration of intravenous contrast. Multiplanar reconstructed images and MIPs were obtained and reviewed to evaluate the vascular anatomy. RADIATION DOSE REDUCTION: This exam was performed according to the departmental dose-optimization program which includes automated exposure control, adjustment of the mA and/or kV according to patient size  and/or use of iterative reconstruction technique. CONTRAST:  9m OMNIPAQUE IOHEXOL 350 MG/ML SOLN COMPARISON:  None Available. FINDINGS: CTA CHEST FINDINGS Cardiovascular: Mild cardiomegaly. Diffuse thickening and coarse calcification of the aortic valve. No significant pericardial effusion/thickening. Atherosclerotic thoracic aorta with dilated 4.1 cm ascending thoracic aorta. Normal caliber pulmonary arteries. No central pulmonary emboli. Mediastinum/Nodes: Hypodense bilateral thyroid nodules, largest 1.7 cm on the right. Unremarkable esophagus. Surgical clips in the left axilla. No axillary  adenopathy. Lungs/Pleura: No pneumothorax. No pleural effusion. Solid 0.5 cm medial basilar right lower lobe pulmonary nodule (series 5/image 87). Solid 0.5 cm posterior left upper lobe pulmonary nodule (series 5/image 69). No acute consolidative airspace disease. Musculoskeletal: No aggressive appearing focal osseous lesions. Moderate thoracic spondylosis. Surgical clips in the left breast. Asymmetric left breast skin thickening. CTA ABDOMEN AND PELVIS FINDINGS Hepatobiliary: Normal liver with no liver mass. Normal gallbladder with no radiopaque cholelithiasis. No biliary ductal dilatation. Pancreas: Normal, with no mass or duct dilation. Spleen: Normal size. No mass. Adrenals/Urinary Tract: Normal adrenals. No contour deforming renal masses. No hydronephrosis. Scattered subcentimeter hypodense bilateral renal cortical lesions are too small to characterize, for which no follow-up imaging is recommended. Collapsed bladder is obscured by streak artifact from left hip hardware and grossly normal. Stomach/Bowel: Small hiatal hernia. Otherwise normal nondistended stomach. Normal caliber small bowel with no small bowel wall thickening. Appendix not discretely visualized. Normal large bowel with no diverticulosis, large bowel wall thickening or pericolonic fat stranding. Vascular/Lymphatic: Atherosclerotic nonaneurysmal abdominal aorta. Patent portal, splenic and renal veins. No pathologically enlarged lymph nodes in the abdomen or pelvis. Reproductive: Status post hysterectomy, with no abnormal findings at the vaginal cuff. No adnexal mass. Other: No pneumoperitoneum, ascites or focal fluid collection. Large fat density 14.3 x 6.2 x 16.5 cm right gluteal intramuscular mass (series 4/image 185). Musculoskeletal: No aggressive appearing focal osseous lesions. Left total hip arthroplasty. Marked lumbar degenerative disc disease. Bilateral posterior spinal fusion at L4-5. VASCULAR MEASUREMENTS PERTINENT TO TAVR: AORTA:  Minimal Aortic Diameter-14.7 x 13.3 mm Severity of Aortic Calcification-moderate RIGHT PELVIS: Right Common Iliac Artery - Minimal Diameter-10.2 x 9.2 mm Tortuosity-mild Calcification-mild Right External Iliac Artery - Minimal Diameter-7.6 x 7.3 mm Tortuosity-mild Calcification-none Right Common Femoral Artery - Minimal Diameter-8.7 x 7.3 mm Tortuosity-mild Calcification-mild LEFT PELVIS: Left Common Iliac Artery - Minimal Diameter-10.9 x 9.3 mm Tortuosity-mild Calcification-mild Left External Iliac Artery - Minimal Diameter-7.8 x 7.4 mm Tortuosity-mild Calcification-none Left Common Femoral Artery - Minimal Diameter-8.0 x 7.2 mm Tortuosity-mild Calcification-mild Review of the MIP images confirms the above findings. IMPRESSION: 1. Vascular findings and measurements pertinent to potential TAVR procedure, as detailed. 2. Diffuse thickening and coarse calcification of the aortic valve, compatible with the reported history of aortic stenosis. 3. Mild cardiomegaly. 4. Two solid pulmonary nodules, largest 0.5 cm. Recommend attention on follow-up chest CT in 3 months given the history of malignancy and absence of prior scans. 5. Hypodense bilateral thyroid nodules, largest 1.7 cm on the right. Recommend thyroid UKorea(ref: J Am Coll Radiol. 2015 Feb;12(2): 143-50). 6. 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 Recommend annual imaging followup by CTA or MRA. This recommendation follows 2010 ACCF/AHA/AATS/ACR/ASA/SCA/SCAI/SIR/STS/SVM Guidelines for the Diagnosis and Management of Patients with Thoracic Aortic Disease. Circulation. 2010; 121:: A569-V948 Aortic aneurysm NOS (ICD10-I71.9). 7. Large fat density 14.3 x 6.2 x 16.5 cm right gluteal intramuscular mass, most compatible with a lipoma. 8. Small hiatal hernia. 9.  Aortic Atherosclerosis (ICD10-I70.0). Electronically Signed   By: JIlona SorrelM.D.   On: 06/04/2022 12:18  Disposition   Pt is being discharged home today in good condition.  Follow-up Plans & Appointments      Follow-up Information     Eileen Stanford, PA-C. Go on 07/06/2022.   Specialties: Cardiology, Radiology Why: @ 9am, please arrive at least 10 minutes early. Contact information: [place] [address] [phone]                Discharge Instructions     Amb Referral to Cardiac Rehabilitation   Complete by: As directed    Diagnosis: Valve Replacement   Valve: Aortic Comment - TAVR   After initial evaluation and assessments completed: Virtual Based Care may be provided alone or in conjunction with Phase 2 Cardiac Rehab based on patient barriers.: Yes   Intensive Cardiac Rehabilitation (ICR) [place] location only OR Traditional Cardiac Rehabilitation (TCR) *If criteria for ICR are not met will enroll in TCR [facility] only): Yes       Discharge Medications   Allergies as of 06/27/2022   No Known Allergies      Medication List     STOP taking these medications    sulfamethoxazole-trimethoprim 800-160 MG tablet Commonly known as: BACTRIM DS       TAKE these medications    amitriptyline 50 MG tablet Commonly known as: ELAVIL Take 1 tablet (50 mg total) by mouth at bedtime.   amLODipine 5 MG tablet Commonly known as: NORVASC Take 1 tablet (5 mg total) by mouth at bedtime.   aspirin 81 MG chewable tablet Chew 1 tablet (81 mg total) by mouth daily. What changed: when to take this   atorvastatin 20 MG tablet Commonly known as: LIPITOR Take 1 tablet (20 mg total) by mouth daily.   benazepril 40 MG tablet Commonly known as: LOTENSIN Take 1 tablet (40 mg total) by mouth daily.   Centrum Silver Adult 50+ Tabs Take 1 tablet by mouth in the morning.   gabapentin 100 MG capsule Commonly known as: NEURONTIN 1 tablet in the morning and 2 at night What changed:  how much to take how to take this when to take this additional instructions   hydrochlorothiazide 12.5 MG capsule Commonly known as: MICROZIDE Take 1 capsule (12.5  mg total) by mouth daily.   levothyroxine 88 MCG tablet Commonly known as: SYNTHROID Take 1 tablet (88 mcg total) by mouth every morning. 30 minutes before food   Osteo Bi-Flex Adv Triple St Tabs Take 1 tablet by mouth in the morning. 69m of Vitamin C  (Ascorbic Acid); 265mManganese (Manganese Sulfate); 3586modium; 1500m105mucosamine HCI; 100mg63moxin (Boswellia Serrata Extract resin); 1103mg 82mndroitin/MSM Complex (Chrondroitin Sulfate, Methylsulfonylmethane, Collagen (Hydrolyzed Gelatin), Boswellia Serrata (resin); Boron (Bororganic Glycine); Hyaluronic Acid   NEURIVA PO Take 1 tablet by mouth daily.   phenylephrine 1 % nasal spray Commonly known as: NEO-SYNEPHRINE Place 1 drop into both nostrils 3 (three) times daily as needed for congestion.   tamoxifen 20 MG tablet Commonly known as: NOLVADEX TAKE 1 TABLET EVERY DAY   venlafaxine XR 37.5 MG 24 hr capsule Commonly known as: EFFEXOR-XR TAKE 1 CAPSULE EVERY DAY WITH BREAKFAST What changed: See the new instructions.            Outstanding Labs/Studies   none  Duration of Discharge Encounter   Greater than 30 minutes including physician time.  SignedMable Fill 06/27/2022, 10:05 AM [phone]

## 2022-06-26 NOTE — Progress Notes (Signed)
Pt arrived to 4e from cath lab. Bilateral groin sites level 0. Palpable pedal pulses bilaterally. Pt oriented to room and staff. Vitals obtained and stable. Tele placed and CCMD notified. Pt educated on bedrest. Pt provided lunch, per request.

## 2022-06-26 NOTE — Progress Notes (Signed)
  Echocardiogram 2D Echocardiogram limited has been performed.  Darlina Sicilian M 06/26/2022, 10:36 AM

## 2022-06-26 NOTE — Anesthesia Procedure Notes (Signed)
Procedure Name: MAC Date/Time: 06/26/2022 9:34 AM  Performed by: Barrington Ellison, CRNAPre-anesthesia Checklist: Patient identified, Emergency Drugs available, Suction available, Patient being monitored and Timeout performed Patient Re-evaluated:Patient Re-evaluated prior to induction Oxygen Delivery Method: Simple face mask

## 2022-06-26 NOTE — Anesthesia Preprocedure Evaluation (Signed)
Anesthesia Evaluation  Patient identified by MRN, date of birth, ID band Patient awake    Reviewed: Allergy & Precautions, NPO status , Patient's Chart, lab work & pertinent test results  Airway Mallampati: II  TM Distance: >3 FB     Dental   Pulmonary neg pulmonary ROS   breath sounds clear to auscultation       Cardiovascular hypertension, Pt. on medications + Valvular Problems/Murmurs AS  Rhythm:Regular Rate:Normal + Systolic murmurs    Neuro/Psych CVA    GI/Hepatic negative GI ROS, Neg liver ROS,,,  Endo/Other  diabetes, Type 2Hypothyroidism    Renal/GU Renal disease     Musculoskeletal  (+) Arthritis ,    Abdominal   Peds  Hematology  (+) Blood dyscrasia, anemia   Anesthesia Other Findings   Reproductive/Obstetrics                             Anesthesia Physical Anesthesia Plan  ASA: 4  Anesthesia Plan: MAC   Post-op Pain Management:    Induction:   PONV Risk Score and Plan: 2 and Dexamethasone, Ondansetron and Treatment may vary due to age or medical condition  Airway Management Planned: Natural Airway and Simple Face Mask  Additional Equipment: Arterial line  Intra-op Plan:   Post-operative Plan:   Informed Consent: I have reviewed the patients History and Physical, chart, labs and discussed the procedure including the risks, benefits and alternatives for the proposed anesthesia with the patient or authorized representative who has indicated his/her understanding and acceptance.       Plan Discussed with:   Anesthesia Plan Comments:        Anesthesia Quick Evaluation

## 2022-06-26 NOTE — CV Procedure (Signed)
[facility]  TAVR OPERATIVE NOTE   Date of Procedure:  06/26/2022  Preoperative Diagnosis: Severe Aortic Stenosis   Postoperative Diagnosis: Same   Procedure:   Transcatheter Aortic Valve Replacement - Transfemoral Approach  Edwards Sapien 3 THV (size 23 mm, model # [id number] serial # [id number] )   Co-Surgeons:  Lauree Chandler, MD and Gilford Raid , MD   Anesthesiologist:  Ola Spurr  Echocardiographer:  O'Neal  Pre-operative Echo Findings: Severe aortic stenosis Normal left ventricular systolic function  Post-operative Echo Findings: No paravalvular leak Normal left ventricular systolic function  BRIEF CLINICAL NOTE AND INDICATIONS FOR SURGERY  82 yo female with history of arthritis, carotid artery disease, borderline diabetes, HTN, hypothyroidism, remote stroke at age 64 and paradoxical low flow/low gradient severe aortic stenosis. Her aortic stenosis has been moderate and has been followed by Dr. Acie Fredrickson for several years. She had a stroke at age 59 and is known to have a chronic left internal carotid artery occlusion. Echo with severe AS. Cardiac cath with mild CAD.   During the course of the patient's preoperative work up they have been evaluated comprehensively by a multidisciplinary team of specialists coordinated through the [facility] in the [facility].  They have been demonstrated to suffer from symptomatic severe aortic stenosis as noted above. The patient has been counseled extensively as to the relative risks and benefits of all options for the treatment of severe aortic stenosis including long term medical therapy, conventional surgery for aortic valve replacement, and transcatheter aortic valve replacement.  The patient has been independently evaluated by Dr. Cyndia Bent with CT surgery and they are felt to be at high risk for conventional surgical aortic valve replacement. The surgeon indicated the  patient would be a poor candidate for conventional surgery. Based upon review of all of the patient's preoperative diagnostic tests they are felt to be candidate for transcatheter aortic valve replacement using the transfemoral approach as an alternative to high risk conventional surgery.    Following the decision to proceed with transcatheter aortic valve replacement, a discussion has been held regarding what types of management strategies would be attempted intraoperatively in the event of life-threatening complications, including whether or not the patient would be considered a candidate for the use of cardiopulmonary bypass and/or conversion to open sternotomy for attempted surgical intervention.  The patient has been advised of a variety of complications that might develop peculiar to this approach including but not limited to risks of death, stroke, paravalvular leak, aortic dissection or other major vascular complications, aortic annulus rupture, device embolization, cardiac rupture or perforation, acute myocardial infarction, arrhythmia, heart block or bradycardia requiring permanent pacemaker placement, congestive heart failure, respiratory failure, renal failure, pneumonia, infection, other late complications related to structural valve deterioration or migration, or other complications that might ultimately cause a temporary or permanent loss of functional independence or other long term morbidity.  The patient provides full informed consent for the procedure as described and all questions were answered preoperatively.    DETAILS OF THE OPERATIVE PROCEDURE  PREPARATION:   The patient is brought to the operating room on the above mentioned date and central monitoring was established by the anesthesia team including placement of a radial arterial line. The patient is placed in the supine position on the operating table.  Intravenous antibiotics are administered. Conscious sedation is used.    Baseline transthoracic echocardiogram was performed. The patient's chest, abdomen, both groins, and both lower extremities are  prepared and draped in a sterile manner. A time out procedure is performed.   PERIPHERAL ACCESS:   Using the modified Seldinger technique, femoral arterial and venous access were obtained with placement of a 6 Fr sheath in the artery and a 7 Fr sheath in the vein on the left side using u/s guidance.  A pigtail diagnostic catheter was passed through the femoral arterial sheath under fluoroscopic guidance into the aortic root.  A temporary transvenous pacemaker catheter was passed through the femoral venous sheath under fluoroscopic guidance into the right ventricle.  The pacemaker was tested to ensure stable lead placement and pacemaker capture. Aortic root angiography was performed in order to determine the optimal angiographic angle for valve deployment.  TRANSFEMORAL ACCESS:  A micropuncture kit was used to gain access to the right femoral artery using u/s guidance. Position confirmed with angiography. Pre-closure with double ProGlide closure devices. The patient was heparinized systemically and ACT verified > 250 seconds.    A 14 Fr transfemoral E-sheath was introduced into the right femoral artery after progressively dilating over an Amplatz superstiff wire. An AL-1 catheter was used to direct a long J wire across the aortic valve into the left ventricle. This was exchanged out for a pigtail catheter and position was confirmed in the LV apex. Simultaneous LV and Ao pressures were recorded.  The pigtail catheter was then exchanged for a Safari wire in the LV apex.   TRANSCATHETER HEART VALVE DEPLOYMENT:  An Edwards Sapien 3 THV (size 23 mm) was prepared and crimped per manufacturer's guidelines, and the proper orientation of the valve is confirmed on the [employer] delivery system. The valve was advanced through the introducer sheath using normal technique until in  an appropriate position in the abdominal aorta beyond the sheath tip. The balloon was then retracted and using the fine-tuning wheel was centered on the valve. The valve was then advanced across the aortic arch using appropriate flexion of the catheter. The valve was carefully positioned across the aortic valve annulus. The Commander catheter was retracted using normal technique. Once final position of the valve has been confirmed by angiographic assessment, the valve is deployed while temporarily holding ventilation and during rapid ventricular pacing to maintain systolic blood pressure < 50 mmHg and pulse pressure < 10 mmHg. The balloon inflation is held for >3 seconds after reaching full deployment volume. Once the balloon has fully deflated the balloon is retracted into the ascending aorta and valve function is assessed using TTE. There is felt to be no paravalvular leak and no central aortic insufficiency.  The patient's hemodynamic recovery following valve deployment is good.  The deployment balloon and guidewire are both removed. Echo demostrated acceptable post-procedural gradients, stable mitral valve function, and no AI.   PROCEDURE COMPLETION:  The sheath was then removed and closure devices were completed. Protamine was administered once femoral arterial repair was complete. The temporary pacemaker, pigtail catheters and femoral sheaths were removed with a Mynx closure device placed in the artery and manual pressure used for venous hemostasis.    The patient tolerated the procedure well and is transported to the surgical intensive care in stable condition. There were no immediate intraoperative complications. All sponge instrument and needle counts are verified correct at completion of the operation.   No blood products were administered during the operation.  The patient received a total of 60 mL of intravenous contrast during the procedure.   Lauree Chandler MD, [facility] 06/26/2022 10:51  AM

## 2022-06-26 NOTE — Progress Notes (Signed)
  [place] VALVE TEAM  Patient doing well s/p TAVR. She is hemodynamically stable. Groin sites stable. ECG with new 1st deg AV block but no high grade block. Plan to DC arterial line and transfer to 4E. Plan for early ambulation after bedrest completed and hopeful discharge over the next 24-48 hours.   Angelena Form PA-C  [facility]  Pager [phone]

## 2022-06-27 ENCOUNTER — Inpatient Hospital Stay (HOSPITAL_BASED_OUTPATIENT_CLINIC_OR_DEPARTMENT_OTHER)
Admit: 2022-06-27 | Discharge: 2022-06-27 | Disposition: A | Discharge status: Home / Self Care | Payer: Medicare HMO | Attending: Physician Assistant | Admitting: Physician Assistant

## 2022-06-27 ENCOUNTER — Encounter (HOSPITAL_COMMUNITY): Payer: Self-pay | Admitting: Cardiovascular Disease

## 2022-06-27 ENCOUNTER — Inpatient Hospital Stay (HOSPITAL_COMMUNITY): Payer: Medicare HMO

## 2022-06-27 DIAGNOSIS — I350 Nonrheumatic aortic (valve) stenosis: Secondary | ICD-10-CM | POA: Diagnosis not present

## 2022-06-27 DIAGNOSIS — Z952 Presence of prosthetic heart valve: Secondary | ICD-10-CM

## 2022-06-27 DIAGNOSIS — I5032 Chronic diastolic (congestive) heart failure: Secondary | ICD-10-CM | POA: Diagnosis not present

## 2022-06-27 DIAGNOSIS — I453 Trifascicular block: Secondary | ICD-10-CM

## 2022-06-27 DIAGNOSIS — Z954 Presence of other heart-valve replacement: Secondary | ICD-10-CM

## 2022-06-27 DIAGNOSIS — I352 Nonrheumatic aortic (valve) stenosis with insufficiency: Secondary | ICD-10-CM | POA: Diagnosis not present

## 2022-06-27 DIAGNOSIS — Z006 Encounter for examination for normal comparison and control in clinical research program: Secondary | ICD-10-CM | POA: Diagnosis not present

## 2022-06-27 LAB — MAGNESIUM: Magnesium: 1.7 mg/dL (ref 1.7–2.4)

## 2022-06-27 LAB — CBC
HCT: 36.6 % (ref 36.0–46.0)
Hemoglobin: 11.8 g/dL — ABNORMAL LOW (ref 12.0–15.0)
MCH: 30.2 pg (ref 26.0–34.0)
MCHC: 32.2 g/dL (ref 30.0–36.0)
MCV: 93.6 fL (ref 80.0–100.0)
Platelets: 203 10*3/uL (ref 150–400)
RBC: 3.91 MIL/uL (ref 3.87–5.11)
RDW: 14.1 % (ref 11.5–15.5)
WBC: 12.3 10*3/uL — ABNORMAL HIGH (ref 4.0–10.5)
nRBC: 0 % (ref 0.0–0.2)

## 2022-06-27 LAB — BASIC METABOLIC PANEL
Anion gap: 9 (ref 5–15)
BUN: 11 mg/dL (ref 8–23)
CO2: 25 mmol/L (ref 22–32)
Calcium: 8.3 mg/dL — ABNORMAL LOW (ref 8.9–10.3)
Chloride: 101 mmol/L (ref 98–111)
Creatinine, Ser: 0.78 mg/dL (ref 0.44–1.00)
GFR, Estimated: >60 mL/min (ref >60)
Glucose, Bld: 144 mg/dL — ABNORMAL HIGH (ref 70–99)
Potassium: 3.7 mmol/L (ref 3.5–5.1)
Sodium: 135 mmol/L (ref 135–145)

## 2022-06-27 NOTE — Anesthesia Postprocedure Evaluation (Signed)
Anesthesia Post Note  Patient: Yvonne [name]  Procedure(s) Performed: Montel Culver AORTIC VALVE REPLACEMENT, TRANSFEMORAL (Chest) INTRAOPERATIVE TRANSTHORACIC ECHOCARDIOGRAM ULTRASOUND GUIDANCE FOR VASCULAR ACCESS (Bilateral: Groin)     Patient location during evaluation: PACU Anesthesia Type: MAC Level of consciousness: awake and alert Pain management: pain level controlled Vital Signs Assessment: post-procedure vital signs reviewed and stable Respiratory status: spontaneous breathing, nonlabored ventilation, respiratory function stable and patient connected to nasal cannula oxygen Cardiovascular status: stable and blood pressure returned to baseline Postop Assessment: no apparent nausea or vomiting Anesthetic complications: no   No notable events documented.  Last Vitals:  Vitals:   06/27/22 0358 06/27/22 0755  BP: (!) 148/64 (!) 152/62  Pulse: 92 73  Resp: 16 18  Temp: 37.6 C 37.3 C  SpO2: 95% 93%    Last Pain:  Vitals:   06/27/22 0755  TempSrc: Oral  PainSc: 0-No pain                 Tiajuana Amass

## 2022-06-27 NOTE — Progress Notes (Signed)
CARDIAC REHAB PHASE I   PRE:  Rate/Rhythm: 83 SR first deg    BP: sitting 152/62 leg    SaO2: 96 RA  MODE:  Ambulation: 330 ft   POST:  Rate/Rhythm: 96 SR    BP: sitting 134/103     SaO2: 92 RA  Pt with mod assist to move to EOB. Stood independently and ambulated with gait belt contact guard. She is mildly unsteady but no LOB, presume this is her baseline. No overt c/o. She is difficult to communicate with due to hearing and mild cognitive deficit.   To EOB and assisted pt with eating breakfast. Pt is forgetful and impulsive therefore n/a currently for education. Will place CRPII referral to [facility] and can call her in the future to determine if she is interested. [place] BS, ACSM-CEP 06/27/2022 9:25 AM

## 2022-06-27 NOTE — Progress Notes (Signed)
Pt is alert and fully oriented x 4, able to follow commands, but appearing impulsive, frequently getting up to the bath room with out using the call bell. She is not compliant with the PureWick external urine catheter. Pt stated She has had urinary incontinent, frequent urination, has to go to the bathroom about 30 times per day. Her symptoms have started for approximately 3-4 months ago. She reported some level of burning sensation of urination. We will hand off to the MD in the morning round for further evaluation.    The bed is near the nursing station, bed alarm is on. Pt is reoriented and reevaluated risk of fall.   She is stable hemodynamically. NSR on the monitor. HR 70s-90s. She has a low grade of fever, temp 100.1-100.3 F tonight. On room air, SPO2 92-93% while she is awakening, but SPO2 87-88% while she is sleeping. Lung sound is clear on auscultations bilaterally. O2 NCL 3 LPM given, no respiratory distress. On Ancef q 8 hr, last dose is at 02:00 am. Tylenol given for fever.   Both groins gauze dressing is dry and clean, no bleeding or hematoma. Palpable patent dorsalis pedis and posterior tibial pulses bilaterally. Pain is well tolerated. We will continue to monitor.   Kennyth Lose, RN

## 2022-06-27 NOTE — Progress Notes (Signed)
  Echocardiogram 2D Echocardiogram has been performed.  Bobbye Charleston 06/27/2022, 8:34 AM

## 2022-06-27 NOTE — Progress Notes (Addendum)
Pt  presents with low grade fever 100.1. Reoriented to time and situation. PRN 650 Tylenol given. Pr presents with urgency and frequency and c/o of mild burning with urination. Pt ambulated with walker and assistance to bathroom.   Temp 99.6

## 2022-06-27 NOTE — Progress Notes (Signed)
1 Day Post-Op Procedure(s) (LRB): TRANSCATHETR AORTIC VALVE REPLACEMENT, TRANSFEMORAL (N/A) INTRAOPERATIVE TRANSTHORACIC ECHOCARDIOGRAM (N/A) ULTRASOUND GUIDANCE FOR VASCULAR ACCESS (Bilateral) Subjective:  Says she had a rough night up and down to the bathroom. She has urinary frequency for the past 6 months or so. No chest pain or SOB. Ambulating well per nurse and aide.  Objective: Vital signs in last 24 hours: Temp:  [97.3 F (36.3 C)-100.3 F (37.9 C)] 99.2 F (37.3 C) (01/10 0755) Pulse Rate:  [69-95] 92 (01/10 0358) Cardiac Rhythm: Normal sinus rhythm;Bundle branch block (01/09 1937) Resp:  [11-22] 18 (01/10 0755) BP: (91-163)/(39-77) 152/62 (01/10 0755) SpO2:  [90 %-100 %] 95 % (01/10 0358) Arterial Line BP: (119-166)/(43-78) 142/54 (01/09 1155) Weight:  [74.3 kg] 74.3 kg (01/10 0358)  Hemodynamic parameters for last 24 hours:    Intake/Output from previous day: 01/09 0701 - 01/10 0700 In: 1664.2 [P.O.:350; I.V.:1314.2] Out: 440 [Urine:400; Blood:40] Intake/Output this shift: No intake/output data recorded.  General appearance: alert and cooperative Neurologic: intact Heart: regular rate and rhythm, S1, S2 normal, no murmur Lungs: clear to auscultation bilaterally Abdomen: soft, non-tender; bowel sounds normal Extremities: warm, no edema Wound: groin sites ok  Lab Results: Recent Labs    06/26/22 1143 06/27/22 0609  WBC  --  12.3*  HGB 10.9* 11.8*  HCT 32.0* 36.6  PLT  --  203   BMET:  Recent Labs    06/26/22 1143 06/27/22 0609  NA 141 135  K 3.7 3.7  CL 106 101  CO2  --  25  GLUCOSE 189* 144*  BUN 16 11  CREATININE 0.80 0.78  CALCIUM  --  8.3*    PT/INR: No results for input(s): "LABPROT", "INR" in the last 72 hours. ABG    Component Value Date/Time   PHART 7.386 05/17/2022 1405   HCO3 24.7 05/17/2022 1406   TCO2 23 06/26/2022 1143   ACIDBASEDEF 1.0 05/17/2022 1406   O2SAT 73 05/17/2022 1406   CBG (last 3)  No results for input(s):  "GLUCAP" in the last 72 hours.  Assessment/Plan: S/P Procedure(s) (LRB): TRANSCATHETR AORTIC VALVE REPLACEMENT, TRANSFEMORAL (N/A) INTRAOPERATIVE TRANSTHORACIC ECHOCARDIOGRAM (N/A) ULTRASOUND GUIDANCE FOR VASCULAR ACCESS (Bilateral)  POD 1 Hemodynamics stable. She had a few brief episodes of SVT which may have been when she was up to bathroom. 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 No second or third degree block postop.  She does have urinary frequency and preop UA showed many bacteria, >50 WBC, + nitrite suggesting possible UTI. Would be worthwhile sending a UC and treating empirically pending that result.  2D echo pending today.  She needs to ambulate halls and then can decide about going home. She lives alone and has an aide who is a good friend but not sure that she can stay with her.   LOS: 1 day    [name] 06/27/2022

## 2022-06-28 ENCOUNTER — Telehealth: Payer: Self-pay | Admitting: Physician Assistant

## 2022-06-28 ENCOUNTER — Telehealth: Payer: Self-pay

## 2022-06-28 DIAGNOSIS — I453 Trifascicular block: Secondary | ICD-10-CM | POA: Diagnosis not present

## 2022-06-28 DIAGNOSIS — Z952 Presence of prosthetic heart valve: Secondary | ICD-10-CM | POA: Diagnosis not present

## 2022-06-28 LAB — URINE CULTURE: Culture: NO GROWTH

## 2022-06-28 NOTE — Telephone Encounter (Signed)
  [place] VALVE TEAM   Patient contacted regarding discharge from [facility] on 1/10  Patient understands to follow up with a structural heart APP on 1/19 at [facility].  Patient understands discharge instructions? yes Patient understands medications and regimen? yes Patient understands to bring all medications to this visit? yes  Angelena Form PA-C  [facility]

## 2022-06-28 NOTE — Patient Outreach (Signed)
  Care Coordination [facility] Note Transition Care Management Unsuccessful Follow-up Telephone Call  Date of discharge and from where:  [facility]  Attempts:  1st Attempt  Reason for unsuccessful TCM follow-up call:  Unable to leave message-voicemail full  Johnney Killian, RN, BSN, CCM Care Management Coordinator [facility] Phone: [phone]: [phone]

## 2022-06-29 LAB — ECHOCARDIOGRAM COMPLETE
AR max vel: 2.67 cm2
AV Area VTI: 2.54 cm2
AV Area mean vel: 2.56 cm2
AV Mean grad: 7.7 mmHg
AV Peak grad: 15.1 mmHg
Ao pk vel: 1.94 m/s
Area-P 1/2: 2.99 cm2
Height: 61 in
S' Lateral: 2.05 cm
Weight: 2619.2 oz

## 2022-07-02 ENCOUNTER — Other Ambulatory Visit: Payer: Self-pay

## 2022-07-02 DIAGNOSIS — F339 Major depressive disorder, recurrent, unspecified: Secondary | ICD-10-CM

## 2022-07-02 DIAGNOSIS — I779 Disorder of arteries and arterioles, unspecified: Secondary | ICD-10-CM

## 2022-07-02 DIAGNOSIS — G629 Polyneuropathy, unspecified: Secondary | ICD-10-CM

## 2022-07-02 DIAGNOSIS — E038 Other specified hypothyroidism: Secondary | ICD-10-CM

## 2022-07-03 MED ORDER — GABAPENTIN 100 MG PO CAPS: ORAL_CAPSULE | ORAL | 0 refills | Status: DC

## 2022-07-03 MED ORDER — LEVOTHYROXINE SODIUM 88 MCG PO TABS: 88.0000 ug | ORAL_TABLET | ORAL | 0 refills | Status: AC

## 2022-07-03 MED ORDER — AMITRIPTYLINE HCL 50 MG PO TABS: 50.0000 mg | ORAL_TABLET | Freq: Every day | ORAL | 0 refills | Status: DC

## 2022-07-03 MED ORDER — ATORVASTATIN CALCIUM 20 MG PO TABS: 20.0000 mg | ORAL_TABLET | Freq: Every day | ORAL | 0 refills | Status: DC

## 2022-07-04 NOTE — Progress Notes (Signed)
[facility] [name]                                     Cardiology Office Note:    Date:  07/06/2022   ID:  Yvonne [name], DOB [birth year], MRN [id number]  PCP:  Yvonne Malay, MD  [facility] Cardiologist:  Yvonne Moores, MD  / Dr. Angelena [name] & Dr. Cyndia [name] (TAVR)  [name] Electrophysiologist:  None   Referring MD: Yvonne Malay, MD   Yvonne [name] s/p TAVR  History of Present Illness:    Yvonne [name] is a 82 y.o. 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  She had a stroke at age 68 and is known to have a chronic left internal carotid artery occlusion. She has had right sided arm and leg weakness since her stroke and has neuropathy in both feet. She had a craniotomy in her 39s with removal of a benign tumor.    Her aortic stenosis has been moderate and followed by Dr. Acie [name] for several years. 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 Angelena [name] in October 2021. Her AS was felt to be moderate and she was asymptomatic. Continued surveillance was recommended. She has been followed every 6 months with serial echos/OVs by our team. She was last seen in the office in 04/2022 and was feeling unwell. Echo 05/04/22 showed EF 70%, severe asymmetric LVH, and severe AS with mean grad 37.6 mmHg, peak grad 66.6 mmHg, AVA 0.83 cm2, DVI 0.25, as well as moderate MAC with mild MS. L/RHC 05/17/22 showed mild non-obstructive CAD and normal right heart pressures. (PCWP 13 mmHg). Pre TAVR CTs showed severe AS with suitable anatomy for TAVR as well as severe asymmetric hypertrophy of the basal septum up to 20 mm.    She was evaluated by the  multidisciplinary valve team and underwent successful TAVR with a 23 mm Edwards Sapien 3 Ultra Resilia THV via the TF approach on 06/26/22. Post operative echo showed EF 70%, moderate concentric LVH, normally functioning TAVR with a mean gradient of 7.7 mmHg and no PVL. Continued on home Asprin 4m daily.  Given underlying trifascicular block, she was discharged with a Zio AT. Of note, her pre op UA was abnormal and she was treated with Bactrim DS. She had some urinary frequency during admission and follow up culture was ordered, which was negative.   Today the patient presents to clinic for follow up. Here with Yvonne [name] Had one episode of chest pain that resolved quickly. Not able to further qualify it. Said she "punched herself in the chest and it went away." No SOB. No LE edema, orthopnea or PND. No dizziness or syncope. No blood in stool or urine. No palpitations.    Past Medical History:  Diagnosis Date   Anemia    Anxiety    Arthritis    Bilateral carotid artery disease ([place] 02/04/2017   Carotid [place]/22: R 1-39; L 100   [place], recurrent ([place] 02/10/2019   Hypertension    Hypothyroidism    Impaired functional mobility, balance, gait,  and endurance 09/02/2015   Malignant neoplasm of lower-outer quadrant of left breast of female, estrogen receptor positive ([place]) 04/24/2021   Mild cognitive impairment with memory loss 09/02/2015   Osteopenia    Pre-diabetes    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 Angelena [name] & Dr. Cyndia [name]   Severe aortic stenosis    Stroke [facility])    pt was 63    Past Surgical History:  Procedure Laterality Date   ABDOMINAL HYSTERECTOMY     BACK SURGERY     2019   BRAIN SURGERY     age 32's- steel plate in back of head   BREAST LUMPECTOMY WITH RADIOACTIVE SEED AND SENTINEL LYMPH NODE BIOPSY Left 05/17/2021   Procedure: LEFT BREAST SEED LOCALIZED LUMPECTOMY (BRACKETED) WITH  SENTINEL LYMPH NODE BIOPSY;  Surgeon: Yvonne Luna, MD;  Location: [place];  Service: General;  Laterality: Left;  PEC BLOCK 90 MINUTES ROOM 9   CATARACT EXTRACTION Bilateral    INTRAOPERATIVE TRANSTHORACIC ECHOCARDIOGRAM N/A 06/26/2022   Procedure: INTRAOPERATIVE TRANSTHORACIC ECHOCARDIOGRAM;  Surgeon: Yvonne Blanks, MD;  Location: [place];  Service: Open Heart Surgery;  Laterality: N/A;   RE-EXCISION OF BREAST LUMPECTOMY Left 06/08/2021   Procedure: RE-EXCISION OF LEFT BREAST LUMPECTOMY;  Surgeon: Yvonne Luna, MD;  Location[facility];  Service: General;  Laterality: Left;   RIGHT HEART CATH AND CORONARY ANGIOGRAPHY N/A 05/17/2022   Procedure: RIGHT HEART CATH AND CORONARY ANGIOGRAPHY;  Surgeon: Yvonne Blanks, MD;  Location: [place] [facility];  Service: Cardiovascular;  Laterality: N/A;   TOE AMPUTATION  2013   2nd toe on right foot, hammer toe   TONSILLECTOMY     TOOTH EXTRACTION     all teeth removed   TOTAL HIP ARTHROPLASTY Left 03/19/2017   Procedure: LEFT TOTAL HIP ARTHROPLASTY ANTERIOR APPROACH;  Surgeon: Mcarthur Rossetti, MD;  Location: [place];  Service: Orthopedics;  Laterality: Left;   TRANSCATHETER AORTIC VALVE REPLACEMENT, TRANSFEMORAL N/A 06/26/2022   Procedure: TRANSCATHETR AORTIC VALVE REPLACEMENT, TRANSFEMORAL;  Surgeon: Yvonne Blanks, MD;  Location: [place];  Service: Open Heart Surgery;  Laterality: N/A;   ULTRASOUND GUIDANCE FOR VASCULAR ACCESS Bilateral 06/26/2022   Procedure: ULTRASOUND GUIDANCE FOR VASCULAR ACCESS;  Surgeon: Yvonne Blanks, MD;  Location: [place];  Service: Open Heart Surgery;  Laterality: Bilateral;    Current Medications: Current Meds  Medication Sig   amitriptyline (ELAVIL) 50 MG tablet Take 1 tablet (50 mg total) by mouth at bedtime.   amLODipine (NORVASC) 5 MG tablet Take 1 tablet (5 mg total) by mouth at bedtime.   aspirin 81 MG chewable tablet Chew 1 tablet (81 mg total) by mouth daily.   atorvastatin  (LIPITOR) 20 MG tablet Take 1 tablet (20 mg total) by mouth daily.   benazepril (LOTENSIN) 40 MG tablet Take 1 tablet (40 mg total) by mouth daily.   gabapentin (NEURONTIN) 100 MG capsule 1 tablet in the morning and 2 at night   hydrochlorothiazide (MICROZIDE) 12.5 MG capsule Take 1 capsule (12.5 mg total) by mouth daily.   levothyroxine (SYNTHROID) 88 MCG tablet Take 1 tablet (88 mcg total) by mouth every morning. 30 minutes before food   Misc Natural Products (NEURIVA PO) Take 1 tablet by mouth daily.   Misc Natural Products (OSTEO BI-FLEX ADV TRIPLE ST) TABS Take 1 tablet by mouth in the morning. 30m of Vitamin C  (Ascorbic Acid); 221mManganese (Manganese  Sulfate); 26m Sodium; 15061mGlucosamine HCI; 10079m-Loxin (Boswellia Serrata Extract resin); 1103m5mhondroitin/MSM Complex (Chrondroitin Sulfate, Methylsulfonylmethane, Collagen (Hydrolyzed Gelatin), Boswellia Serrata (resin); Boron (Bororganic Glycine); Hyaluronic Acid   Multiple Vitamins-Minerals (CENTRUM SILVER ADULT 50+) TABS Take 1 tablet by mouth in the morning.   phenylephrine (NEO-SYNEPHRINE) 1 % nasal spray Place 1 drop into both nostrils 3 (three) times daily as needed for congestion.   tamoxifen (NOLVADEX) 20 MG tablet TAKE 1 TABLET EVERY DAY   venlafaxine XR (EFFEXOR-XR) 37.5 MG 24 hr capsule TAKE 1 CAPSULE EVERY DAY WITH BREAKFAST     Allergies:   Patient has no known allergies.   Social History   Socioeconomic History   Marital status: Widowed    Spouse name: Not on file   Number of children: 2   Years of education: masters   Highest education level: Not on file  Occupational History   Occupation: RetiPrimary school teacherTIRED  Tobacco Use   Smoking status: Never   Smokeless tobacco: Never  Vaping Use   Vaping Use: Never used  Substance and Sexual Activity   Alcohol use: Not Currently    Alcohol/week: 1.0 standard drink of alcohol    Types: 1 Glasses of wine per week   Drug use: No   Sexual activity:  Not Currently    Birth control/protection: Post-menopausal  Other Topics Concern   Not on file  Social History Narrative   Health Care POA:    Emergency Contact: son, JohnDoreene Eland) [phone] of Life Plan:    Who lives with you: lives alone now    Any pets: 2 cats, Sadie and Katie   Diet: Pt has a varied diet and tries to limit sweets.    Exercise: Pt walks on treadmill 10 minutes a day and uses bike 10 minutes a day.   Seatbelts: Pt reports wearing seatbelt when in vehicles.    Sun Exposure/Protection: Pt does not use sun protection.   Hobbies: reading, watching movies, writing      Husband passed away about 10 years ago    Social Determinants of Health   Financial Resource Strain: Low Risk  (06/23/2021)   Overall Financial Resource Strain (CARDIA)    Difficulty of Paying Living Expenses: Not very hard  Food Insecurity: No Food Insecurity (06/07/2018)   Hunger Vital Sign    Worried About Running Out of Food in the Last Year: Never true    Ran Out of Food in the Last Year: Never true  Transportation Needs: No Transportation Needs (06/23/2021)   PRAPARE - TranHydrologistdical): No    Lack of Transportation (Non-Medical): No  Physical Activity: Unknown (06/07/2018)   Exercise Vital Sign    Days of Exercise per Week: Patient refused    Minutes of Exercise per Session: Patient refused  Stress: No Stress Concern Present (06/23/2021)   [place]eling of Stress : Only a little  Social Connections: Moderately Integrated (06/23/2021)   Social Connection and Isolation Panel [NHANES]    Frequency of Communication with Friends and Family: More than three times a week    Frequency of Social Gatherings with Friends and Family: Once a week    Attends Religious Services: More than 4 times per year    Active Member of [employer]: Yes    Attends ClubArchivisttings:  Never    Marital Status: Widowed  Family History: The patient's family history includes Heart disease in her father; Thyroid disease in her mother.  ROS:   Please see the history of present illness.    All other systems reviewed and are negative.  EKGs/Labs/Other Studies Reviewed:    The following studies were reviewed today:  TAVR OPERATIVE NOTE     Date of Procedure:                06/26/2022   Preoperative Diagnosis:      Severe Aortic Stenosis    Postoperative Diagnosis:    Same    Procedure:        Transcatheter Aortic Valve Replacement - Transfemoral Approach             Edwards Sapien 3 THV (size 23 mm, model # T562222 serial # [id number] )              Co-Surgeons:                        Lauree Chandler, MD and Gilford Raid , MD    Anesthesiologist:                  Ola Spurr   Echocardiographer:              O'Neal   Pre-operative Echo Findings: Severe aortic stenosis Normal left ventricular systolic function   Post-operative Echo Findings: No paravalvular leak Normal left ventricular systolic function _____________     Echo 06/27/22:  IMPRESSIONS   1. The aortic valve has been repaired/replaced. Aortic valve  regurgitation is not visualized. There is a 23 mm Sapien prosthetic (TAVR)  valve present in the aortic position. Procedure Date: 06/26/2022. Echo  findings are consistent with normal structure  and function of the aortic valve prosthesis. Aortic valve area, by VTI  measures 2.54 cm. Aortic valve mean gradient measures 7.7 mmHg. Aortic  valve Vmax measures 1.94 m/s.   2. Left ventricular ejection fraction, by estimation, is 70 to 75%. The  left ventricle has hyperdynamic function. The left ventricle has no  regional wall motion abnormalities. There is moderate concentric left  ventricular hypertrophy. Left ventricular  diastolic parameters are consistent with Grade I diastolic dysfunction  (impaired relaxation).   3. Right ventricular  systolic function is normal. The right ventricular  size is normal.   4. The mitral valve is degenerative. Trivial mitral valve regurgitation.  No evidence of mitral stenosis.   5. The inferior vena cava is normal in size with greater than 50%  respiratory variability, suggesting right atrial pressure of 3 mmHg.    EKG:  EKG is ordered today.  The ekg ordered today demonstrates sinus with IRBBB, HR 87  Recent Labs: 04/30/2022: TSH 2.840 06/22/2022: ALT 16 06/27/2022: BUN 11; Creatinine, Ser 0.78; Hemoglobin 11.8; Magnesium 1.7; Platelets 203; Potassium 3.7; Sodium 135  Recent Lipid Panel    Component Value Date/Time   CHOL 125 04/30/2022 1508   TRIG 154 (H) 04/30/2022 1508   HDL 44 04/30/2022 1508   CHOLHDL 2.8 04/30/2022 1508   CHOLHDL 3.1 07/25/2016 1155   VLDL 38 (H) 07/25/2016 1155   LDLCALC 55 04/30/2022 1508   LDLDIRECT 57 10/20/2020 1226   LDLDIRECT 89.0 12/08/2014 1019     Risk Assessment/Calculations:       Physical Exam:    VS:  BP 132/72   Pulse 87   Ht _0  (1.549 m)   Wt 159 lb (72.1 kg)  SpO2 96%   BMI 30.04 kg/m     Wt Readings from Last 3 Encounters:  07/06/22 159 lb (72.1 kg)  06/27/22 163 lb 11.2 oz (74.3 kg)  06/06/22 162 lb (73.5 kg)     GEN:  Well nourished, well developed in no acute distress HEENT: Normal NECK: No JVD LYMPHATICS: No lymphadenopathy CARDIAC: RRR, no murmurs, rubs, gallops RESPIRATORY:  Clear to auscultation without rales, wheezing or rhonchi  ABDOMEN: Soft, non-tender, non-distended MUSCULOSKELETAL:  No edema; No deformity  SKIN: Warm and dry.  Groin sites clear without hematoma or ecchymosis  NEUROLOGIC:  Alert and oriented x 3 PSYCHIATRIC:  Normal affect   ASSESSMENT:    1. S/P TAVR (transcatheter aortic valve replacement)   2. Trifascicular block   3. Essential hypertension   4. History of CVA (cerebrovascular accident)   5. Pulmonary nodule   6. Thyroid nodule   7. Aneurysm of ascending aorta without  rupture ([place])   8. LVH (left ventricular hypertrophy)    PLAN:    In order of problems listed above:  Severe AS s/p TAVR: doing fine 1 week out from TAVR. ECG with no HAVB. Groin sites healing well. Continue Asprin 13m daily. SBE prophylaxis discussed; the patient is edentulous and does not go to the dentist. Will see her back for 1 month follow up and echo.    Conduction disease: wearing a Zio AT with no high risk alerts. ECG today only shows IRBBB.   HTN: BP well controlled today. No changes made.   Hx of CVA: continue on aspirin and statin    Pulmonary nodules: pre TAVR CT showed "two solid pulmonary nodules, largest 0.5 cm. Recommend attention on follow-up chest CT in 3 months given the history of malignancy and absence of prior scans." This has been discussed with pt and ordered today.   Thyroid nodules: pre TAVR CT showed a "hypodense bilateral thyroid nodules, largest 1.7 cm on the right. Recommend thyroid [place]" This has been discussed with pt and ordered today.    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 Recommend annual imaging followup by CTA or MRA". This has been discussed with pt and will be ordered at 1 year follow up.    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 Will refer to Dr. MDe Burrs This will be discussed in the outpatient setting.     Cardiac Rehabilitation Eligibility Assessment  The patient has declined or is not appropriate for cardiac rehabilitation.      Medication Adjustments/Labs and Tests Ordered: Current medicines are reviewed at length with the patient today.  Concerns regarding medicines are outlined above.  Orders Placed This Encounter  Procedures   CT Chest Wo Contrast   [place] Tissue Head/Neck   EKG 12-Lead   No orders of the defined types were placed in this encounter.   Patient Instructions  Medication  Instructions:  Your physician recommends that you continue on your current medications as directed. Please refer to the Current Medication list given to you today.  *If you need a refill on your cardiac medications before your next appointment, please call your pharmacy*   Testing/Procedures: CT-scan of the chest   ultrasound of thyroid    Follow-Up: [facility] you and your health needs are our priority.  As part of our continuing mission to provide you with exceptional heart care, we have  created designated Provider Care Teams.  These Care Teams include your primary Cardiologist (physician) and Advanced Practice Providers (APPs -  Physician Assistants and Nurse Practitioners) who all work together to provide you with the care you need, when you need it.  We recommend signing up for the patient portal called "MyChart".  Sign up information is provided on this After Visit Summary.  MyChart is used to connect with patients for Virtual Visits (Telemedicine).  Patients are able to view lab/test results, encounter notes, upcoming appointments, etc.  Non-urgent messages can be sent to your provider as well.   To learn more about what you can do with MyChart, go to [email or web address].    Your next appointment:   Next available , not urgent   Provider:   Dr. Gasper Sells  for [facility]     Signed, Yvonne Form, PA-C  07/06/2022 10:09 AM    [place]

## 2022-07-06 ENCOUNTER — Ambulatory Visit: Payer: Medicare HMO | Attending: Physician Assistant | Admitting: Physician Assistant

## 2022-07-06 VITALS — BP 132/72 | HR 87 | Ht 61.0 in | Wt 159.0 lb

## 2022-07-06 DIAGNOSIS — R911 Solitary pulmonary nodule: Secondary | ICD-10-CM | POA: Diagnosis not present

## 2022-07-06 DIAGNOSIS — Z8673 Personal history of transient ischemic attack (TIA), and cerebral infarction without residual deficits: Secondary | ICD-10-CM | POA: Diagnosis not present

## 2022-07-06 DIAGNOSIS — I517 Cardiomegaly: Secondary | ICD-10-CM

## 2022-07-06 DIAGNOSIS — Z952 Presence of prosthetic heart valve: Secondary | ICD-10-CM

## 2022-07-06 DIAGNOSIS — I7121 Aneurysm of the ascending aorta, without rupture: Secondary | ICD-10-CM | POA: Diagnosis not present

## 2022-07-06 DIAGNOSIS — E041 Nontoxic single thyroid nodule: Secondary | ICD-10-CM | POA: Diagnosis not present

## 2022-07-06 DIAGNOSIS — I453 Trifascicular block: Secondary | ICD-10-CM | POA: Diagnosis not present

## 2022-07-06 DIAGNOSIS — I10 Essential (primary) hypertension: Secondary | ICD-10-CM

## 2022-07-06 NOTE — Patient Instructions (Addendum)
Medication Instructions:  Your physician recommends that you continue on your current medications as directed. Please refer to the Current Medication list given to you today.  *If you need a refill on your cardiac medications before your next appointment, please call your pharmacy*   Testing/Procedures: CT-scan of the chest   ultrasound of thyroid    Follow-Up: At [facility], you and your health needs are our priority.  As part of our continuing mission to provide you with exceptional heart care, we have created designated Provider Care Teams.  These Care Teams include your primary Cardiologist (physician) and Advanced Practice Providers (APPs -  Physician Assistants and Nurse Practitioners) who all work together to provide you with the care you need, when you need it.  We recommend signing up for the patient portal called "MyChart".  Sign up information is provided on this After Visit Summary.  MyChart is used to connect with patients for Virtual Visits (Telemedicine).  Patients are able to view lab/test results, encounter notes, upcoming appointments, etc.  Non-urgent messages can be sent to your provider as well.   To learn more about what you can do with MyChart, go to [email or web address].    Your next appointment:   Next available , not urgent   Provider:   Dr. Gasper Sells  for [place] [facility]

## 2022-07-10 ENCOUNTER — Telehealth (HOSPITAL_COMMUNITY): Payer: Self-pay

## 2022-07-10 NOTE — Telephone Encounter (Signed)
Pt insurance is active and benefits verified through [facility]. Co-pay $0.00, DED $240.00/$17.45 met, out of pocket $8,850.00/$17.45 met, co-insurance 20%. No pre-authorization required. Passport, 07/10/22 @ 9:41AM, PTW#[id number]   How many CR sessions are covered? (36 sessions for TCR, 72 sessions for ICR)72 Is this a lifetime maximum or an annual maximum? Lifetime Has the member used any of these services to date? No Is there a time limit (weeks/months) on start of program and/or program completion? No   Pt insurance is active through [place]. S[facility]#[id number]   Will fax over Medicaid Reimbursement form to Dr. Acie Fredrickson   Will contact patient to see if she is interested in the Cardiac Rehab Program. If interested, patient will need to complete follow up appt. Once completed, patient will be contacted for scheduling upon review by the RN Navigator.

## 2022-07-10 NOTE — Telephone Encounter (Signed)
Attempted to call patient in regards to Cardiac Rehab - unable to leave VM, VM box full

## 2022-07-10 NOTE — Progress Notes (Signed)
Just ignore it. Her kidney function improved. Nothing to change.

## 2022-07-11 ENCOUNTER — Other Ambulatory Visit (HOSPITAL_COMMUNITY): Payer: Medicare HMO

## 2022-07-11 ENCOUNTER — Ambulatory Visit: Payer: Medicare HMO

## 2022-07-11 MED FILL — Potassium Chloride Inj 2 mEq/ML: INTRAVENOUS | Qty: 40 | Status: AC

## 2022-07-11 MED FILL — Magnesium Sulfate Inj 50%: INTRAMUSCULAR | Qty: 10 | Status: AC

## 2022-07-11 MED FILL — Heparin Sodium (Porcine) Inj 1000 Unit/ML: Qty: 1000 | Status: AC

## 2022-07-23 ENCOUNTER — Ambulatory Visit (HOSPITAL_COMMUNITY)
Admission: RE | Admit: 2022-07-23 | Discharge: 2022-07-23 | Disposition: A | Discharge status: Home / Self Care | Payer: Medicare HMO | Source: Ambulatory Visit | Attending: Physician Assistant | Admitting: Physician Assistant

## 2022-07-23 DIAGNOSIS — R911 Solitary pulmonary nodule: Secondary | ICD-10-CM | POA: Diagnosis not present

## 2022-07-23 DIAGNOSIS — E041 Nontoxic single thyroid nodule: Secondary | ICD-10-CM | POA: Diagnosis not present

## 2022-07-23 DIAGNOSIS — R918 Other nonspecific abnormal finding of lung field: Secondary | ICD-10-CM | POA: Diagnosis not present

## 2022-07-25 ENCOUNTER — Telehealth: Payer: Self-pay | Admitting: Physician Assistant

## 2022-07-25 ENCOUNTER — Ambulatory Visit (HOSPITAL_COMMUNITY)
Admission: RE | Admit: 2022-07-25 | Discharge: 2022-07-25 | Disposition: A | Discharge status: Home / Self Care | Payer: Medicare HMO | Source: Ambulatory Visit | Attending: Physician Assistant | Admitting: Physician Assistant

## 2022-07-25 ENCOUNTER — Telehealth: Payer: Self-pay

## 2022-07-25 ENCOUNTER — Other Ambulatory Visit: Payer: Self-pay

## 2022-07-25 DIAGNOSIS — E041 Nontoxic single thyroid nodule: Secondary | ICD-10-CM

## 2022-07-25 NOTE — Telephone Encounter (Signed)
I did not need this encounter. °

## 2022-07-25 NOTE — Telephone Encounter (Signed)
Request from [place] tech to change order to [place] thyroid.

## 2022-08-03 ENCOUNTER — Other Ambulatory Visit (HOSPITAL_COMMUNITY): Payer: Medicare HMO

## 2022-08-03 ENCOUNTER — Ambulatory Visit (HOSPITAL_COMMUNITY): Payer: Medicare HMO | Attending: Cardiology

## 2022-08-03 ENCOUNTER — Ambulatory Visit: Payer: Medicare HMO

## 2022-08-03 DIAGNOSIS — Z952 Presence of prosthetic heart valve: Secondary | ICD-10-CM | POA: Diagnosis not present

## 2022-08-03 LAB — ECHOCARDIOGRAM COMPLETE
AR max vel: 2.18 cm2
AV Area VTI: 2.29 cm2
AV Area mean vel: 2.23 cm2
AV Mean grad: 11 mmHg
AV Peak grad: 21.5 mmHg
Ao pk vel: 2.32 m/s
Area-P 1/2: 3.31 cm2
S' Lateral: 2.7 cm

## 2022-08-06 NOTE — Progress Notes (Unsigned)
HEART AND [place]                                     Cardiology Office Note:    Date:  08/08/2022   ID:  Yvonne [name], DOB [birth year], MRN [id number]  PCP:  Martyn Malay, MD  [facility] Cardiologist:  Mertie Moores, MD  [facility] Electrophysiologist:  None   Referring MD: Martyn Malay, MD   Chief Complaint  Patient presents with   Follow-up    1 month s/p TAVR   History of Present Illness:    Yvonne [name] is a 82 y.o. 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   [name] had a stroke at age 25 and is known to have a chronic left internal carotid artery occlusion. She has had right sided arm and leg weakness since her stroke and has neuropathy in both feet. She had a craniotomy in her 51s with removal of a benign tumor.    Her aortic stenosis has been moderate and followed by Dr. Acie Fredrickson for several years. 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 Angelena Form in October 2021. Her AS was felt to be moderate and she was asymptomatic. Continued surveillance was recommended. She has been followed every 6 months with serial echos/OVs by our team. She was last seen in the office in 04/2022 and was feeling unwell. Echo 05/04/22 showed EF 70%, severe asymmetric LVH, and severe AS with mean grad 37.6 mmHg, peak grad 66.6 mmHg, AVA 0.83 cm2, DVI 0.25, as well as moderate MAC with mild MS. L/RHC 05/17/22 showed mild non-obstructive CAD and normal right heart pressures. (PCWP 13 mmHg). Pre TAVR CTs showed severe AS with suitable anatomy for TAVR as well as severe asymmetric hypertrophy of the basal septum up to  20 mm.    She was evaluated by the multidisciplinary valve team and underwent successful TAVR with a 23 mm Edwards Sapien 3 Ultra Resilia THV via the TF approach on 06/26/22. Post operative echo showed EF 70%, moderate concentric LVH, normally functioning TAVR with a mean gradient of 7.7 mmHg and no PVL. She was continued on Asprin 17m daily. Given underlying trifascicular block, she was discharged with a Zio AT which showed no significant arrhthymias.    In follow up she was doing well with no significant issues. Today she is here with her caregiver and reports that she continues to do great. She reports more energy and less SOB. She denies chest pain, LE edema, orthopnea and PND. No dizziness or syncope. No blood in stool or urine. No palpitations.   Past Medical History:  Diagnosis Date   Anemia    Anxiety    Arthritis    Bilateral carotid artery disease ([facility] 02/04/2017   Carotid [place]/22: R 1-39; L 100   Depression, recurrent ([place] 02/10/2019   Hypertension    Hypothyroidism    Impaired functional mobility, balance, gait, and endurance 09/02/2015   Malignant neoplasm of lower-outer quadrant of left breast of female, estrogen receptor positive ([facility] 04/24/2021  Mild cognitive impairment with memory loss 09/02/2015   Osteopenia    Pre-diabetes    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 Angelena Form & Dr. Cyndia Bent   Severe aortic stenosis    Stroke [facility])    pt was 65    Past Surgical History:  Procedure Laterality Date   ABDOMINAL HYSTERECTOMY     BACK SURGERY     2019   BRAIN SURGERY     age 41's- steel plate in back of head   BREAST LUMPECTOMY WITH RADIOACTIVE SEED AND SENTINEL LYMPH NODE BIOPSY Left 05/17/2021   Procedure: LEFT BREAST SEED LOCALIZED LUMPECTOMY (BRACKETED) WITH SENTINEL LYMPH NODE BIOPSY;  Surgeon: Erroll Luna, MD;  Location: [place];  Service: General;  Laterality: Left;  PEC  BLOCK 90 MINUTES ROOM 9   CATARACT EXTRACTION Bilateral    INTRAOPERATIVE TRANSTHORACIC ECHOCARDIOGRAM N/A 06/26/2022   Procedure: INTRAOPERATIVE TRANSTHORACIC ECHOCARDIOGRAM;  Surgeon: Burnell Blanks, MD;  Location: [place];  Service: Open Heart Surgery;  Laterality: N/A;   RE-EXCISION OF BREAST LUMPECTOMY Left 06/08/2021   Procedure: RE-EXCISION OF LEFT BREAST LUMPECTOMY;  Surgeon: Erroll Luna, MD;  [facility];  Service: General;  Laterality: Left;   RIGHT HEART CATH AND CORONARY ANGIOGRAPHY N/A 05/17/2022   Procedure: RIGHT HEART CATH AND CORONARY ANGIOGRAPHY;  Surgeon: Burnell Blanks, MD;  Location: [place] CV LAB;  Service: Cardiovascular;  Laterality: N/A;   TOE AMPUTATION  2013   2nd toe on right foot, hammer toe   TONSILLECTOMY     TOOTH EXTRACTION     all teeth removed   TOTAL HIP ARTHROPLASTY Left 03/19/2017   Procedure: LEFT TOTAL HIP ARTHROPLASTY ANTERIOR APPROACH;  Surgeon: Mcarthur Rossetti, MD;  Location: [place];  Service: Orthopedics;  Laterality: Left;   TRANSCATHETER AORTIC VALVE REPLACEMENT, TRANSFEMORAL N/A 06/26/2022   Procedure: TRANSCATHETR AORTIC VALVE REPLACEMENT, TRANSFEMORAL;  Surgeon: Burnell Blanks, MD;  Location: [place];  Service: Open Heart Surgery;  Laterality: N/A;   ULTRASOUND GUIDANCE FOR VASCULAR ACCESS Bilateral 06/26/2022   Procedure: ULTRASOUND GUIDANCE FOR VASCULAR ACCESS;  Surgeon: Burnell Blanks, MD;  Location: [place];  Service: Open Heart Surgery;  Laterality: Bilateral;    Current Medications: Current Meds  Medication Sig   amitriptyline (ELAVIL) 50 MG tablet Take 1 tablet (50 mg total) by mouth at bedtime.   amLODipine (NORVASC) 5 MG tablet Take 1 tablet (5 mg total) by mouth at bedtime.   aspirin 81 MG chewable tablet Chew 1 tablet (81 mg total) by mouth daily.   atorvastatin (LIPITOR) 20 MG tablet Take 1 tablet (20 mg total) by mouth daily.   benazepril (LOTENSIN) 40 MG tablet Take 1 tablet (40 mg  total) by mouth daily.   gabapentin (NEURONTIN) 100 MG capsule 1 tablet in the morning and 2 at night   hydrochlorothiazide (MICROZIDE) 12.5 MG capsule Take 1 capsule (12.5 mg total) by mouth daily.   levothyroxine (SYNTHROID) 88 MCG tablet Take 1 tablet (88 mcg total) by mouth every morning. 30 minutes before food   Misc Natural Products (NEURIVA PO) Take 1 tablet by mouth daily.   Misc Natural Products (OSTEO BI-FLEX ADV TRIPLE ST) TABS Take 1 tablet by mouth in the morning. 43m of Vitamin C  (Ascorbic Acid); 285mManganese (Manganese Sulfate); 3540modium; 1500m38mucosamine HCI; 100mg79moxin (Boswellia Serrata Extract resin); 1103mg 4mndroitin/MSM Complex (Chrondroitin Sulfate, Methylsulfonylmethane, Collagen (Hydrolyzed Gelatin), Boswellia  Serrata (resin); Boron (Bororganic Glycine); Hyaluronic Acid   Multiple Vitamins-Minerals (CENTRUM SILVER ADULT 50+) TABS Take 1 tablet by mouth in the morning.   phenylephrine (NEO-SYNEPHRINE) 1 % nasal spray Place 1 drop into both nostrils 3 (three) times daily as needed for congestion.   tamoxifen (NOLVADEX) 20 MG tablet TAKE 1 TABLET EVERY DAY   venlafaxine XR (EFFEXOR-XR) 37.5 MG 24 hr capsule TAKE 1 CAPSULE EVERY DAY WITH BREAKFAST     Allergies:   Patient has no known allergies.   Social History   Socioeconomic History   Marital status: Widowed    Spouse name: Not on file   Number of children: 2   Years of education: masters   Highest education level: Not on file  Occupational History   Occupation: Primary school teacher: RETIRED  Tobacco Use   Smoking status: Never   Smokeless tobacco: Never  Vaping Use   Vaping Use: Never used  Substance and Sexual Activity   Alcohol use: Not Currently    Alcohol/week: 1.0 standard drink of alcohol    Types: 1 Glasses of wine per week   Drug use: No   Sexual activity: Not Currently    Birth control/protection: Post-menopausal  Other Topics Concern   Not on file  Social History Narrative    Health Care POA:    Emergency Contact: son, Doreene Eland, (c) [phone]   End of Life Plan:    Who lives with you: lives alone now    Any pets: 2 cats, Sadie and Katie   Diet: Pt has a varied diet and tries to limit sweets.    Exercise: Pt walks on treadmill 10 minutes a day and uses bike 10 minutes a day.   Seatbelts: Pt reports wearing seatbelt when in vehicles.    Sun Exposure/Protection: Pt does not use sun protection.   Hobbies: reading, watching movies, writing      Husband passed away about 10 years ago    Social Determinants of Health   Financial Resource Strain: Low Risk  (06/23/2021)   Overall Financial Resource Strain (CARDIA)    Difficulty of Paying Living Expenses: Not very hard  Food Insecurity: No Food Insecurity (06/07/2018)   Hunger Vital Sign    Worried About Running Out of Food in the Last Year: Never true    Ran Out of Food in the Last Year: Never true  Transportation Needs: No Transportation Needs (06/23/2021)   PRAPARE - Hydrologist (Medical): No    Lack of Transportation (Non-Medical): No  Physical Activity: Unknown (06/07/2018)   Exercise Vital Sign    Days of Exercise per Week: Patient refused    Minutes of Exercise per Session: Patient refused  Stress: No Stress Concern Present (06/23/2021)   [place]    Feeling of Stress : Only a little  Social Connections: Moderately Integrated (06/23/2021)   Social Connection and Isolation Panel [NHANES]    Frequency of Communication with Friends and Family: More than three times a week    Frequency of Social Gatherings with Friends and Family: Once a week    Attends Religious Services: More than 4 times per year    Active Member of [employer] or Organizations: Yes    Attends Archivist Meetings: Never    Marital Status: Widowed    Family History: The patient's family history includes Heart disease in her father;  Thyroid disease in her mother.  ROS:  Please see the history of present illness.    All other systems reviewed and are negative.  EKGs/Labs/Other Studies Reviewed:    The following studies were reviewed today:  Echocardiogram 08/03/22:   1. Left ventricular ejection fraction, by estimation, is 65 to 70%. The left ventricle has normal function. The left ventricle has no regional wall motion abnormalities. There is moderate left ventricular hypertrophy. Left ventricular diastolic  parameters are consistent with Grade I diastolic dysfunction (impaired relaxation).  2. Right ventricular systolic function is normal. The right ventricular size is normal.  3. Left atrial size was moderately dilated.  4. The mitral valve is normal in structure. No evidence of mitral valve regurgitation. No evidence of mitral stenosis. Moderate mitral annular calcification.  5. The aortic valve has been repaired/replaced. Aortic valve regurgitation is not visualized. No aortic stenosis is present. There is a 23 mm Edwards Sapien prosthetic (TAVR) valve present in the aortic position. Procedure Date: 06/26/2022. Echo findings  are consistent with normal structure and function of the aortic valve prosthesis. Aortic valve area, by VTI measures 2.29 cm. Aortic valve mean gradient measures 11.0 mmHg. Aortic valve Vmax measures 2.32 m/s.  6. Aortic dilatation noted. There is borderline dilatation of the ascending aorta, measuring 36 mm.  7. The inferior vena cava is normal in size with greater than 50% respiratory variability, suggesting right atrial pressure of 3 mmHg.   TAVR OPERATIVE NOTE     Date of Procedure:                06/26/2022   Preoperative Diagnosis:      Severe Aortic Stenosis    Postoperative Diagnosis:    Same    Procedure:        Transcatheter Aortic Valve Replacement - Transfemoral Approach             Edwards Sapien 3 THV (size 23 mm, model # [id number] serial # [id number] )               Co-Surgeons:                        Lauree Chandler, MD and Gilford Raid , MD    Anesthesiologist:                  Ola Spurr   Echocardiographer:              O'Neal   Pre-operative Echo Findings: Severe aortic stenosis Normal left ventricular systolic function   Post-operative Echo Findings: No paravalvular leak Normal left ventricular systolic function _____________    Echo 06/27/22:  IMPRESSIONS   1. The aortic valve has been repaired/replaced. Aortic valve  regurgitation is not visualized. There is a 23 mm Sapien prosthetic (TAVR)  valve present in the aortic position. Procedure Date: 06/26/2022. Echo  findings are consistent with normal structure  and function of the aortic valve prosthesis. Aortic valve area, by VTI  measures 2.54 cm. Aortic valve mean gradient measures 7.7 mmHg. Aortic  valve Vmax measures 1.94 m/s.   2. Left ventricular ejection fraction, by estimation, is 70 to 75%. The  left ventricle has hyperdynamic function. The left ventricle has no  regional wall motion abnormalities. There is moderate concentric left  ventricular hypertrophy. Left ventricular  diastolic parameters are consistent with Grade I diastolic dysfunction  (impaired relaxation).   3. Right ventricular systolic function is normal. The right ventricular  size is normal.   4. The mitral valve is  degenerative. Trivial mitral valve regurgitation.  No evidence of mitral stenosis.   5. The inferior vena cava is normal in size with greater than 50%  respiratory variability, suggesting right atrial pressure of 3 mmHg.    EKG:  EKG is not ordered today.    Recent Labs: 04/30/2022: TSH 2.840 06/22/2022: ALT 16 06/27/2022: BUN 11; Creatinine, Ser 0.78; Hemoglobin 11.8; Magnesium 1.7; Platelets 203; Potassium 3.7; Sodium 135  Recent Lipid Panel    Component Value Date/Time   CHOL 125 04/30/2022 1508   TRIG 154 (H) 04/30/2022 1508   HDL 44 04/30/2022 1508   CHOLHDL 2.8 04/30/2022 1508    CHOLHDL 3.1 07/25/2016 1155   VLDL 38 (H) 07/25/2016 1155   LDLCALC 55 04/30/2022 1508   LDLDIRECT 57 10/20/2020 1226   LDLDIRECT 89.0 12/08/2014 1019   Physical Exam:    VS:  BP 122/64   Pulse 90   Ht 5' 1"$  (1.549 m)   Wt 163 lb (73.9 kg)   SpO2 95%   BMI 30.80 kg/m     Wt Readings from Last 3 Encounters:  08/08/22 163 lb (73.9 kg)  07/06/22 159 lb (72.1 kg)  06/27/22 163 lb 11.2 oz (74.3 kg)    General: Well developed, well nourished, NAD Neck: Negative for carotid bruits. No JVD Lungs:Clear to ausculation bilaterally. No wheezes, rales, or rhonchi. Breathing is unlabored. Cardiovascular: RRR with S1 S2. No murmurs Extremities: No edema.  Neuro: Alert and oriented. No focal deficits. No facial asymmetry. MAE spontaneously. Psych: Responds to questions appropriately with normal affect.    ASSESSMENT/PLAN:    Severe AS s/p TAVR: Patient doing well with NYHA class I symptoms s/p TAVR. One month post TAVR echo performed 2/16 shows stable valve function with no PVL. Mean gradient at 67mHg with an AVA at 2.29cm2. Continue Asprin 865mdaily. Patient is edentulous and does not require SBE. Plan for consultation with Dr. ChGasper Sellsor severe septal LVH and she will see [place] in one year for OV and repeat echo.    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 No change.    HTN: Stable with no changes needed at this time.    Hx of CVA: Continue on aspirin and statin    Pulmonary nodules: pre TAVR CT showed "two solid pulmonary nodules, largest 0.5 cm. Recommend attention on follow-up chest CT in 3 months given the history of malignancy and absence of prior scans." [facility]     Thyroid nodules: pre TAVR CT showed a "hypodense bilateral thyroid nodules, largest 1.7 cm on the right. Recommend thyroid [place] [facility]    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 Recommend annual imaging followup by CTA or MRA". This has been discussed with pt and will be ordered at 1 year follow up.    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 She is scheduled to see Dr. MaDe Burrsext week. Discussed with patient today.    Medication Adjustments/Labs and Tests Ordered: Current medicines are reviewed at length with the patient today.  Concerns regarding medicines are outlined above.  No orders of the defined types were placed in this encounter.  No orders of the defined types were placed in this encounter.   Patient Instructions  Medication Instructions:  Your physician recommends that you continue on your current medications as directed. Please refer to the Current Medication list given to you today.  *If you need a refill on your cardiac medications before your next appointment, please call your pharmacy*   Lab Work: NONE If you have labs (blood work) drawn today and your tests are completely normal, you will receive your results only by: [place] (if you have MyChart) OR A paper copy in the mail If you have any lab test that is abnormal or we need to change your treatment, we will call you to review the results.   Testing/Procedures: NONE   Follow-Up: At [facility], you and your health needs are our priority.  As part of our continuing mission to provide you with exceptional heart care, we have created designated Provider Care Teams.  These Care Teams include your primary Cardiologist (physician) and Advanced Practice Providers (APPs -  Physician Assistants and Nurse Practitioners) who all work together to provide you with the care you need, when you need it.  We recommend signing up for the patient portal called "MyChart".  Sign up information is provided on this After Visit Summary.  MyChart  is used to connect with patients for Virtual Visits (Telemedicine).  Patients are able to view lab/test results, encounter notes, upcoming appointments, etc.  Non-urgent messages can be sent to your provider as well.   To learn more about what you can do with MyChart, go to [email or web address].    Your next appointment:   6 month(s)  Provider:   Mertie Moores, MD     Signed, Kathyrn Drown, NP  08/08/2022 3:09 PM    [facility]

## 2022-08-08 ENCOUNTER — Ambulatory Visit: Payer: Medicare HMO | Attending: Internal Medicine | Admitting: Cardiology

## 2022-08-08 VITALS — BP 122/64 | HR 90 | Ht 61.0 in | Wt 163.0 lb

## 2022-08-08 DIAGNOSIS — I062 Rheumatic aortic stenosis with insufficiency: Secondary | ICD-10-CM

## 2022-08-08 DIAGNOSIS — Z952 Presence of prosthetic heart valve: Secondary | ICD-10-CM

## 2022-08-08 DIAGNOSIS — E041 Nontoxic single thyroid nodule: Secondary | ICD-10-CM

## 2022-08-08 DIAGNOSIS — I517 Cardiomegaly: Secondary | ICD-10-CM | POA: Diagnosis not present

## 2022-08-08 DIAGNOSIS — Z8673 Personal history of transient ischemic attack (TIA), and cerebral infarction without residual deficits: Secondary | ICD-10-CM | POA: Diagnosis not present

## 2022-08-08 DIAGNOSIS — I10 Essential (primary) hypertension: Secondary | ICD-10-CM

## 2022-08-08 DIAGNOSIS — R413 Other amnesia: Secondary | ICD-10-CM

## 2022-08-08 DIAGNOSIS — R911 Solitary pulmonary nodule: Secondary | ICD-10-CM | POA: Diagnosis not present

## 2022-08-08 NOTE — Patient Instructions (Signed)
Medication Instructions:  Your physician recommends that you continue on your current medications as directed. Please refer to the Current Medication list given to you today.  *If you need a refill on your cardiac medications before your next appointment, please call your pharmacy*   Lab Work: NONE If you have labs (blood work) drawn today and your tests are completely normal, you will receive your results only by: [place] (if you have MyChart) OR A paper copy in the mail If you have any lab test that is abnormal or we need to change your treatment, we will call you to review the results.   Testing/Procedures: NONE   Follow-Up: At [facility], you and your health needs are our priority.  As part of our continuing mission to provide you with exceptional heart care, we have created designated Provider Care Teams.  These Care Teams include your primary Cardiologist (physician) and Advanced Practice Providers (APPs -  Physician Assistants and Nurse Practitioners) who all work together to provide you with the care you need, when you need it.  We recommend signing up for the patient portal called "MyChart".  Sign up information is provided on this After Visit Summary.  MyChart is used to connect with patients for Virtual Visits (Telemedicine).  Patients are able to view lab/test results, encounter notes, upcoming appointments, etc.  Non-urgent messages can be sent to your provider as well.   To learn more about what you can do with MyChart, go to [email or web address].    Your next appointment:   6 month(s)  Provider:   Mertie Moores, MD

## 2022-08-10 NOTE — Progress Notes (Signed)
Per documentation on CT Chest s/p TAVR.   Tommie Raymond, NP 07/24/2022  1:42 PM EST     Stable solid nodules of the right middle lobe and right lower lobe with recommendations for continued surveillance with 6-12 month follow-up. I am happy to order this to be done prior to your 1 year TAVR follow up.

## 2022-08-14 ENCOUNTER — Encounter: Payer: Self-pay | Admitting: Internal Medicine

## 2022-08-14 ENCOUNTER — Ambulatory Visit: Payer: Medicare HMO | Attending: Internal Medicine | Admitting: Internal Medicine

## 2022-08-14 ENCOUNTER — Ambulatory Visit (INDEPENDENT_AMBULATORY_CARE_PROVIDER_SITE_OTHER): Payer: Medicare HMO

## 2022-08-14 VITALS — BP 118/68 | HR 75 | Ht 61.0 in | Wt 162.0 lb

## 2022-08-14 DIAGNOSIS — I350 Nonrheumatic aortic (valve) stenosis: Secondary | ICD-10-CM

## 2022-08-14 DIAGNOSIS — R002 Palpitations: Secondary | ICD-10-CM | POA: Diagnosis not present

## 2022-08-14 DIAGNOSIS — I10 Essential (primary) hypertension: Secondary | ICD-10-CM | POA: Diagnosis not present

## 2022-08-14 DIAGNOSIS — I422 Other hypertrophic cardiomyopathy: Secondary | ICD-10-CM | POA: Diagnosis not present

## 2022-08-14 NOTE — Progress Notes (Unsigned)
[id number] ZIO XT from office inventory applied in office.

## 2022-08-14 NOTE — Progress Notes (Signed)
Cardiology Office Note:    Date:  08/14/2022   ID:  Yvonne [name], DOB 1940-08-10, MRN [id number]  PCP:  Martyn Malay, MD   [facility] Providers Cardiologist:  Mertie Moores, MD     Referring MD: Martyn Malay, MD   CC: HCM Consulted for the evaluation of septal varient HCM at the behest of Dr.Brown and Nell Range   History of Present Illness:    Yvonne [name] is a 82 y.o. female with a hx of HTN and DM, RBBB, prior strokes with hemi paresis.  She had had prior crainotomy for brain tumor excision.  She had had prior breast cancer without chemo with prior radiation 2023. She is s/p recent TAVR.  As part of her work up she was found to have basal septal hypertrophy of 23 mm.  She has felt much better after the valve. She is getting her strength up. Had her aid and best friend who helps her. She is able to do ADLs and go to church service. She has recovered from her stroke and has been relatively functional.  Patient notes no SOB at rest and no DOE, since getting the heart valve Notes that she palpitations that occur every 1-2 weeks. Notes no CP. Notes no Dizziness. Notes no syncope. Notable family events include  That her parents died in their 82s: there etiology is unclear because there was heavy alcohol use involved. Has one son and one daughter.   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 Angelena Form & Dr. Cyndia Bent   Severe aortic stenosis    Stroke [facility])    pt was 8    Past Surgical History:  Procedure Laterality Date   ABDOMINAL HYSTERECTOMY     BACK SURGERY     2019   BRAIN SURGERY     age 77's- steel plate in back of head   BREAST LUMPECTOMY WITH RADIOACTIVE SEED AND SENTINEL LYMPH NODE BIOPSY Left 05/17/2021   Procedure: LEFT BREAST SEED LOCALIZED LUMPECTOMY (BRACKETED) WITH SENTINEL LYMPH NODE BIOPSY;  Surgeon: Erroll Luna, MD;  Location: [facility];  Service: General;  Laterality: Left;  PEC BLOCK 90 MINUTES ROOM 9   CATARACT EXTRACTION Bilateral    INTRAOPERATIVE TRANSTHORACIC ECHOCARDIOGRAM N/A 06/26/2022   Procedure: INTRAOPERATIVE TRANSTHORACIC ECHOCARDIOGRAM;  Surgeon: Burnell Blanks, MD;  Location: [place];  Service: Open Heart Surgery;  Laterality: N/A;   RE-EXCISION OF BREAST LUMPECTOMY Left 06/08/2021   Procedure: RE-EXCISION OF LEFT BREAST LUMPECTOMY;  Surgeon: Erroll Luna, MD;  Location[facility];  Service: General;  Laterality: Left;   RIGHT HEART CATH AND CORONARY ANGIOGRAPHY N/A 05/17/2022   Procedure: RIGHT HEART CATH AND CORONARY ANGIOGRAPHY;  Surgeon: Burnell Blanks, MD;  Location: [facility] [facility];  Service: Cardiovascular;  Laterality: N/A;   TOE AMPUTATION  2013   2nd toe on  right foot, hammer toe   TONSILLECTOMY     TOOTH EXTRACTION     all teeth removed   TOTAL HIP ARTHROPLASTY Left 03/19/2017   Procedure: LEFT TOTAL HIP ARTHROPLASTY ANTERIOR APPROACH;  Surgeon: Mcarthur Rossetti, MD;  Location: [place];  Service: Orthopedics;  Laterality: Left;   TRANSCATHETER AORTIC VALVE REPLACEMENT, TRANSFEMORAL N/A 06/26/2022   Procedure: TRANSCATHETR AORTIC VALVE REPLACEMENT, TRANSFEMORAL;  Surgeon: Burnell Blanks, MD;  Location: [place];  Service: Open Heart Surgery;  Laterality: N/A;   ULTRASOUND GUIDANCE FOR VASCULAR ACCESS Bilateral 06/26/2022   Procedure: ULTRASOUND GUIDANCE FOR VASCULAR ACCESS;   Surgeon: Burnell Blanks, MD;  Location: [place];  Service: Open Heart Surgery;  Laterality: Bilateral;    Current Medications: Current Meds  Medication Sig   amitriptyline (ELAVIL) 50 MG tablet Take 1 tablet (50 mg total) by mouth at bedtime.   amLODipine (NORVASC) 5 MG tablet Take 1 tablet (5 mg total) by mouth at bedtime.   aspirin 81 MG chewable tablet Chew 1 tablet (81 mg total) by mouth daily.   atorvastatin (LIPITOR) 20 MG tablet Take 1 tablet (20 mg total) by mouth daily.   benazepril (LOTENSIN) 40 MG tablet Take 1 tablet (40 mg total) by mouth daily.   gabapentin (NEURONTIN) 100 MG capsule 1 tablet in the morning and 2 at night   levothyroxine (SYNTHROID) 88 MCG tablet Take 1 tablet (88 mcg total) by mouth every morning. 30 minutes before food   Misc Natural Products (NEURIVA PO) Take 1 tablet by mouth daily.   Misc Natural Products (OSTEO BI-FLEX ADV TRIPLE ST) TABS Take 1 tablet by mouth in the morning. '60mg'$  of Vitamin C  (Ascorbic Acid); '2mg'$  Manganese (Manganese Sulfate); '35mg'$  Sodium; '1500mg'$  Glucosamine HCI; '100mg'$  5-Loxin (Boswellia Serrata Extract resin); '1103mg'$  (Chondroitin/MSM Complex (Chrondroitin Sulfate, Methylsulfonylmethane, Collagen (Hydrolyzed Gelatin), Boswellia Serrata (resin); Boron (Bororganic Glycine); Hyaluronic Acid   Multiple Vitamins-Minerals (CENTRUM SILVER ADULT 50+) TABS Take 1 tablet by mouth in the morning.   phenylephrine (NEO-SYNEPHRINE) 1 % nasal spray Place 1 drop into both nostrils 3 (three) times daily as needed for congestion.   tamoxifen (NOLVADEX) 20 MG tablet TAKE 1 TABLET EVERY DAY   venlafaxine XR (EFFEXOR-XR) 37.5 MG 24 hr capsule TAKE 1 CAPSULE EVERY DAY WITH BREAKFAST   [DISCONTINUED] hydrochlorothiazide (MICROZIDE) 12.5 MG capsule Take 1 capsule (12.5 mg total) by mouth daily.     Allergies:   Patient has no known allergies.   Social History   Socioeconomic History   Marital status: Widowed    Spouse name: Not on file   Number of  children: 2   Years of education: masters   Highest education level: Not on file  Occupational History   Occupation: Primary school teacher: RETIRED  Tobacco Use   Smoking status: Never   Smokeless tobacco: Never  Vaping Use   Vaping Use: Never used  Substance and Sexual Activity   Alcohol use: Not Currently    Alcohol/week: 1.0 standard drink of alcohol    Types: 1 Glasses of wine per week   Drug use: No   Sexual activity: Not Currently    Birth control/protection: Post-menopausal  Other Topics Concern   Not on file  Social History Narrative   Health Care POA:    Emergency Contact: son, Doreene Eland, (c) [phone]   End of Life Plan:    Who lives with you: lives alone now    Any pets: 2 cats, Sadie and Katie   Diet:  Pt has a varied diet and tries to limit sweets.    Exercise: Pt walks on treadmill 10 minutes a day and uses bike 10 minutes a day.   Seatbelts: Pt reports wearing seatbelt when in vehicles.    Sun Exposure/Protection: Pt does not use sun protection.   Hobbies: reading, watching movies, writing      Husband passed away about 10 years ago    Social Determinants of Health   Financial Resource Strain: Low Risk  (06/23/2021)   Overall Financial Resource Strain (CARDIA)    Difficulty of Paying Living Expenses: Not very hard  Food Insecurity: No Food Insecurity (06/07/2018)   Hunger Vital Sign    Worried About Running Out of Food in the Last Year: Never true    Ran Out of Food in the Last Year: Never true  Transportation Needs: No Transportation Needs (06/23/2021)   PRAPARE - Hydrologist (Medical): No    Lack of Transportation (Non-Medical): No  Physical Activity: Unknown (06/07/2018)   Exercise Vital Sign    Days of Exercise per Week: Patient refused    Minutes of Exercise per Session: Patient refused  Stress: No Stress Concern Present (06/23/2021)   [place]     Feeling of Stress : Only a little  Social Connections: Moderately Integrated (06/23/2021)   Social Connection and Isolation Panel [NHANES]    Frequency of Communication with Friends and Family: More than three times a week    Frequency of Social Gatherings with Friends and Family: Once a week    Attends Religious Services: More than 4 times per year    Active Member of [employer] or Organizations: Yes    Attends Archivist Meetings: Never    Marital Status: Widowed     Family History: The patient's family history includes Heart disease in her father; Thyroid disease in her mother.  ROS:   Please see the history of present illness.     All other systems reviewed and are negative.  EKGs/Labs/Other Studies Reviewed:    The following studies were reviewed today:   EKG:  EKG is  ordered today.  The ekg ordered today demonstrates  07/25/22: SR iRBBB  Cardiac Studies & Procedures   CARDIAC CATHETERIZATION  CARDIAC CATHETERIZATION 05/17/2022  Narrative   Ost RCA to Prox RCA lesion is 20% stenosed.   Prox LAD lesion is 20% stenosed.  Mild non-obstructive CAD Normal right heart pressures. (PCWP 13 mmHg)  Recommendations: Will continue workup for TAVR.  Findings Coronary Findings Diagnostic  Dominance: Right  Left Anterior Descending Vessel is large. Prox LAD lesion is 20% stenosed.  Left Circumflex Vessel is large.  Right Coronary Artery Vessel is large. Ost RCA to Prox RCA lesion is 20% stenosed.  Intervention  No interventions have been documented.     ECHOCARDIOGRAM  ECHOCARDIOGRAM COMPLETE 08/03/2022  Narrative ECHOCARDIOGRAM REPORT    Patient Name:   Yvonne [name] Date of Exam: 08/03/2022 Medical Rec #:  [id number]      Height:       61.0 in Accession #:    [id number]     Weight:       159.0 lb Date of Birth:  [birth year]     BSA:          1.713 m Patient Age:    30 years       BP:           132/72  mmHg Patient Gender: F              HR:            80 bpm. Exam Location:  [address]  Procedure: 2D Echo, Cardiac Doppler, Color Doppler and 3D Echo  Indications:    1 month s/p TAVR V43.3  History:        Patient has prior history of Echocardiogram examinations, most recent 06/27/2022. Risk Factors:Hypertension. Aortic Valve: 23 mm Edwards Sapien prosthetic, stented (TAVR) valve is present in the aortic position. Procedure Date: 06/26/2022.  Sonographer:    Mikki Santee RDCS Referring Phys: [place]   1. Left ventricular ejection fraction, by estimation, is 65 to 70%. The left ventricle has normal function. The left ventricle has no regional wall motion abnormalities. There is moderate left ventricular hypertrophy. Left ventricular diastolic parameters are consistent with Grade I diastolic dysfunction (impaired relaxation). 2. Right ventricular systolic function is normal. The right ventricular size is normal. 3. Left atrial size was moderately dilated. 4. The mitral valve is normal in structure. No evidence of mitral valve regurgitation. No evidence of mitral stenosis. Moderate mitral annular calcification. 5. The aortic valve has been repaired/replaced. Aortic valve regurgitation is not visualized. No aortic stenosis is present. There is a 23 mm Edwards Sapien prosthetic (TAVR) valve present in the aortic position. Procedure Date: 06/26/2022. Echo findings are consistent with normal structure and function of the aortic valve prosthesis. Aortic valve area, by VTI measures 2.29 cm. Aortic valve mean gradient measures 11.0 mmHg. Aortic valve Vmax measures 2.32 m/s. 6. Aortic dilatation noted. There is borderline dilatation of the ascending aorta, measuring 36 mm. 7. The inferior vena cava is normal in size with greater than 50% respiratory variability, suggesting right atrial pressure of 3 mmHg.  FINDINGS Left Ventricle: Left ventricular ejection fraction, by estimation, is 65 to 70%. The left ventricle  has normal function. The left ventricle has no regional wall motion abnormalities. The left ventricular internal cavity size was normal in size. There is moderate left ventricular hypertrophy. Left ventricular diastolic parameters are consistent with Grade I diastolic dysfunction (impaired relaxation).  Right Ventricle: The right ventricular size is normal. No increase in right ventricular wall thickness. Right ventricular systolic function is normal.  Left Atrium: Left atrial size was moderately dilated.  Right Atrium: Right atrial size was normal in size.  Pericardium: There is no evidence of pericardial effusion.  Mitral Valve: The mitral valve is normal in structure. Moderate mitral annular calcification. No evidence of mitral valve regurgitation. No evidence of mitral valve stenosis.  Tricuspid Valve: The tricuspid valve is normal in structure. Tricuspid valve regurgitation is trivial. No evidence of tricuspid stenosis.  Aortic Valve: The aortic valve has been repaired/replaced. Aortic valve regurgitation is not visualized. No aortic stenosis is present. Aortic valve mean gradient measures 11.0 mmHg. Aortic valve peak gradient measures 21.5 mmHg. Aortic valve area, by VTI measures 2.29 cm. There is a 23 mm Edwards Sapien prosthetic, stented (TAVR) valve present in the aortic position. Procedure Date: 06/26/2022. Echo findings are consistent with normal structure and function of the aortic valve prosthesis.  Pulmonic Valve: The pulmonic valve was normal in structure. Pulmonic valve regurgitation is not visualized. No evidence of pulmonic stenosis.  Aorta: Aortic dilatation noted. There is borderline dilatation of the ascending aorta, measuring 36 mm.  Venous: The inferior vena cava is normal in size with greater than 50% respiratory variability, suggesting right atrial pressure of 3  mmHg.  IAS/Shunts: The interatrial septum appears to be lipomatous. No atrial level shunt detected by color  flow Doppler.   LEFT VENTRICLE PLAX 2D LVIDd:         4.30 cm   Diastology LVIDs:         2.70 cm   LV e' medial:    3.41 cm/s LV PW:         1.30 cm   LV E/e' medial:  26.6 LV IVS:        1.20 cm   LV e' lateral:   4.05 cm/s LVOT diam:     2.30 cm   LV E/e' lateral: 22.4 LV SV:         97 LV SV Index:   57 LVOT Area:     4.15 cm  3D Volume EF: 3D EF:        63 % LV EDV:       100 ml LV ESV:       37 ml LV SV:        63 ml  RIGHT VENTRICLE RV Basal diam:  2.90 cm RV Mid diam:    2.50 cm RV S prime:     18.40 cm/s TAPSE (M-mode): 3.6 cm  LEFT ATRIUM             Index        RIGHT ATRIUM           Index LA diam:        5.20 cm 3.04 cm/m   RA Area:     12.50 cm LA Vol (A2C):   44.7 ml 26.09 ml/m  RA Volume:   24.30 ml  14.18 ml/m LA Vol (A4C):   35.0 ml 20.43 ml/m LA Biplane Vol: 40.2 ml 23.46 ml/m AORTIC VALVE AV Area (Vmax):    2.18 cm AV Area (Vmean):   2.23 cm AV Area (VTI):     2.29 cm AV Vmax:           232.00 cm/s AV Vmean:          152.000 cm/s AV VTI:            0.425 m AV Peak Grad:      21.5 mmHg AV Mean Grad:      11.0 mmHg LVOT Vmax:         122.00 cm/s LVOT Vmean:        81.600 cm/s LVOT VTI:          0.234 m LVOT/AV VTI ratio: 0.55  AORTA Ao Root diam: 3.50 cm Ao Asc diam:  3.90 cm  MITRAL VALVE MV Area (PHT): 3.31 cm     SHUNTS MV Decel Time: 229 msec     Systemic VTI:  0.23 m MV E velocity: 90.80 cm/s   Systemic Diam: 2.30 cm MV A velocity: 154.00 cm/s MV E/A ratio:  0.59  Glori Bickers MD Electronically signed by Glori Bickers MD Signature Date/Time: 08/03/2022/2:09:06 PM    Final    MONITORS  LONG TERM MONITOR-LIVE TELEMETRY (3-14 DAYS) 07/24/2022  Narrative   Predominate rhythm is sinus   15 episodes of SVT lasting from 5 -19 beats.   the longest had avg HR of 132 .   Rare PVCs, PACs   No significant arrhythmias observed.   Patch Wear Time:  13 days and 23 hours (2024-01-10T10:27:13-0500 to  2024-01-24T10:10:08-498)  Patient had a min HR of 56 bpm, max HR of 197 bpm, and avg HR  of 77 bpm. Predominant underlying rhythm was Sinus Rhythm. First Degree AV Block was present. Bundle Branch Block/IVCD was present. 15 Supraventricular Tachycardia runs occurred, the run with the fastest interval lasting 5 beats with a max rate of 197 bpm, the longest lasting 19 beats with an avg rate of 132 bpm. Isolated SVEs were rare (<1.0%), SVE Couplets were rare (<1.0%), and SVE Triplets were rare (<1.0%). Isolated VEs were rare (<1.0%, 1077), VE Couplets were rare (<1.0%, 5), and VE Triplets were rare (<1.0%, 1).   CT SCANS  CT CORONARY MORPH W/CTA COR W/SCORE 06/05/2022  Addendum 06/05/2022 10:41 AM ADDENDUM REPORT: 06/05/2022 10:38  CLINICAL DATA:  Severe Aortic Stenosis.  EXAM: Cardiac TAVR CT  TECHNIQUE: A non-contrast, gated CT scan was obtained with axial slices of 3 mm through the heart for aortic valve calcium scoring. A 120 kV retrospective, gated, contrast cardiac scan was obtained. Gantry rotation speed was 250 msecs and collimation was 0.6 mm. Nitroglycerin was not given. The 3D data set was reconstructed in 5% intervals of the 0-95% of the R-R cycle. Systolic and diastolic phases were analyzed on a dedicated workstation using MPR, MIP, and VRT modes. The patient received 100 cc of contrast.  FINDINGS: Image quality: Excellent.  Noise artifact is: Limited.  Valve Morphology: Tricuspid aortic valve with severe diffuse calcifications. Severely restricted leaflet motion in systole.  Aortic Valve Calcium score: 2026  Aortic annular dimension:  Phase assessed: 25%  Annular area: 415 mm2  Annular perimeter: 73.6 mm  Max diameter: 25.2 mm  Min diameter: 22.3 mm  Annular and subannular calcification: None.  Membranous septum length: 8.2 mm  Optimal coplanar projection: LAO 19 CRA 1  Coronary Artery Height above Annulus:  Left Main: 16.7 mm  Right Coronary:  17.4 mm  Sinus of Valsalva Measurements:  Non-coronary: 31 mm  Right-coronary: 31 mm  Left-coronary: 32 mm  Sinus of Valsalva Height:  Non-coronary: 24.6 mm  Right-coronary: 21.5 mm  Left-coronary: 22.7 mm  Sinotubular Junction: 30 mm  Ascending Thoracic Aorta: Mild dilation of the ascending aorta, 40 mm.  Coronary Arteries: Normal coronary origin. Right dominance. The study was performed without use of NTG and is insufficient for plaque evaluation. Please refer to recent cardiac catheterization for coronary assessment.  Cardiac Morphology:  Right Atrium: Right atrial size is within normal limits.  Right Ventricle: The right ventricular cavity is within normal limits.  Left Atrium: Left atrial size is normal in size with no left atrial appendage filling defect. 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  Left Ventricle: The ventricular cavity size is within normal limits. Severe asymmetric hypertrophy of the basal septum up to 20 mm. Small diverticulum of the ventricular apex.  Pulmonary arteries: Normal in size without proximal filling defect.  Pulmonary veins: Normal pulmonary venous drainage.  Pericardium: Normal thickness with no significant effusion or calcium present.  Mitral Valve: The mitral valve is normal structure without significant calcification.  Extra-cardiac findings: See attached radiology report for non-cardiac structures.  IMPRESSION: 1. Annular measurements support a 23 mm S3 or 29 mm Evolut Pro.  2. No significant annular or subannular calcifications.  3. Sufficient coronary to annulus distance.  4. Optimal Fluoroscopic Angle for Delivery: LAO 19 CRA 1  5. Mild dilation of the ascending aorta up to 40 mm.  6. Severe asymmetric hypertrophy of the basal septum up to 20 mm.  7. Small diverticulum of the ventricular apex.  8. 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  [place] Bells T. O'Neal,  MD   Electronically Signed By: Eleonore Chiquito M.D. On: 06/05/2022  10:38  Narrative EXAM: OVER-READ INTERPRETATION  CT CHEST  The following report is a limited chest CT over-read performed by radiologist Dr. Salvatore Marvel of [facility], [place] on 06/04/2022. This over-read does not include interpretation of cardiac or coronary anatomy or pathology. The cardiac CTA interpretation by the cardiologist is attached.  COMPARISON:  None Available.  FINDINGS: Please see the separate concurrent chest CT angiogram report for details.  IMPRESSION: Please see the separate concurrent chest CT angiogram report for details.  Electronically Signed: By: Ilona Sorrel M.D. On: 06/04/2022 11:42           Recent Labs: 04/30/2022: TSH 2.840 06/22/2022: ALT 16 06/27/2022: BUN 11; Creatinine, Ser 0.78; Hemoglobin 11.8; Magnesium 1.7; Platelets 203; Potassium 3.7; Sodium 135  Recent Lipid Panel    Component Value Date/Time   CHOL 125 04/30/2022 1508   TRIG 154 (H) 04/30/2022 1508   HDL 44 04/30/2022 1508   CHOLHDL 2.8 04/30/2022 1508   CHOLHDL 3.1 07/25/2016 1155   VLDL 38 (H) 07/25/2016 1155   LDLCALC 55 04/30/2022 1508   LDLDIRECT 57 10/20/2020 1226   LDLDIRECT 89.0 12/08/2014 1019       Physical Exam:    VS:  BP 118/68   Pulse 75   Ht '5\' 1"'$  (1.549 m)   Wt 162 lb (73.5 kg)   SpO2 96%   BMI 30.61 kg/m     Wt Readings from Last 3 Encounters:  08/14/22 162 lb (73.5 kg)  08/08/22 163 lb (73.9 kg)  07/06/22 159 lb (72.1 kg)    GEN: Elderly female HEENT: Normal NECK: No JVD CARDIAC: RRR, Harsh systolic murmur; unable to effective hand grip or Valsalva, worse with standing, no rubs, gallops RESPIRATORY:  Clear to auscultation without rales, wheezing or rhonchi  ABDOMEN: Soft, non-tender, non-distended MUSCULOSKELETAL:  No edema; No deformity  SKIN: Warm and dry NEUROLOGIC:  Alert and oriented x 3 PSYCHIATRIC:  Normal affect   ASSESSMENT:    1. Hypertrophic cardiomyopathy (HCC)   2. Palpitations   3. Severe aortic stenosis   4.  Essential hypertension    PLAN:    Hypertrophic Cardiomyopathy vs long standing sequelae of AS  - Basal Variant - no resting LVOT gradient on imaging; Chordal SAM - NYHA I - she has had no LE edema, I suspect we could attempt to remove her diuretic to improved her murmur; while keeping her BP controlled - will get heart monitor for her palpitations and may start beta blocker based on that an ambulatory BP  - Family history reviewed, Discussed family screening: her son and daughter live in [place]; we will recommend 5 year echo screening and defer genetic testing for now - Given her age and comorbidity and maximal thickness well assessed on CT (23 mm) will not plan on CMR unless significant ectopy  Three to four months; if BP controled, no swelling and no ectopy; will plan for one year follow up and conservative approach  Time Spent Directly with Patient:   I have spent a total of 40 minutes with the patient reviewing notes, imaging, EKGs, labs and examining the patient as well as establishing an assessment and plan that was discussed personally with the patient.  > 50% of time was spent in direct patient care and friends/family and reviewing imaging with patient and disease state education.            Medication Adjustments/Labs and Tests Ordered:  Current medicines are reviewed at length with the patient today.  Concerns regarding medicines are outlined above.  Orders Placed This Encounter  Procedures   LONG TERM MONITOR (3-14 DAYS)   No orders of the defined types were placed in this encounter.   Patient Instructions  Medication Instructions:  Your physician has recommended you make the following change in your medication:   1) STOP hydrochlorothiazide  *If you need a refill on your cardiac medications before your next appointment, please call your pharmacy*  Lab Work: None  Testing/Procedures: Your physician has requested that you wear a Zio heart monitor for 14 days.  This will be mailed to your home with instructions on how to apply the monitor and how to return it when finished. Please allow 2 weeks after returning the heart monitor before our office calls you with the results.   Follow-Up: At [facility], you and your health needs are our priority.  As part of our continuing mission to provide you with exceptional heart care, we have created designated Provider Care Teams.  These Care Teams include your primary Cardiologist (physician) and Advanced Practice Providers (APPs -  Physician Assistants and Nurse Practitioners) who all work together to provide you with the care you need, when you need it.  Your next appointment:   3-4 month(s)  Provider:   Rudean Haskell, MD  Other Instructions Bryn [place]ull[place]- Long Term Monitor Instructions     Your physician has requested you wear a ZIO patch monitor for 14 days.  This is a single patch monitor. [employer] supplies one patch monitor per enrollment. Additional  stickers are not available. Please do not apply patch if you will be having a Nuclear Stress Test,  Echocardiogram, Cardiac CT, MRI, or Chest Xray during the period you would be wearing the  monitor. The patch cannot be worn during these tests. You cannot remove and re-apply the  ZIO XT patch monitor.  Your ZIO patch monitor will be mailed 3 day USPS to your address on file. It may take 3-5 days  to receive your monitor after you have been enrolled.  Once you have received your monitor, please review the enclosed instructions. Your monitor  has already been registered assigning a specific monitor serial # to you.     Billing and Patient Assistance Program Information     We have supplied [employer] with any of your insurance information on file for billing purposes.  [employer] offers a sliding scale Patient Assistance Program for patients that do not have  insurance, or whose insurance does not completely cover the cost of the ZIO monitor.  You  must apply for the Patient Assistance Program to qualify for this discounted rate.  To apply, please call [employer] at [phone], select option 4, select option 2, ask to apply for  Patient Assistance Program. Theodore Demark will ask your household income, and how many people  are in your household. They will quote your out-of-pocket cost based on that information.  [employer] will also be able to set up a 45-month interest-free payment plan if needed.     Applying the monitor     Shave hair from upper left chest.  Hold abrader disc by orange tab. Rub abrader in 40 strokes over the upper left chest as  indicated in your monitor instructions.  Clean area with 4 enclosed alcohol pads. Let dry.  Apply patch as indicated in monitor instructions. Patch will be placed under collarbone on left  side of chest with  arrow pointing upward.  Rub patch adhesive wings for 2 minutes. Remove white label marked "1". Remove the white  label marked "2". Rub patch adhesive wings for 2 additional minutes.  While looking in a mirror, press and release button in center of patch. A small green light will  flash 3-4 times. This will be your only indicator that the monitor has been turned on.  Do not shower for the first 24 hours. You may shower after the first 24 hours.  Press the button if you feel a symptom. You will hear a small click. Record Date, Time and  Symptom in the Patient Logbook.  When you are ready to remove the patch, follow instructions on the last 2 pages of Patient  Logbook. Stick patch monitor onto the last page of Patient Logbook.  Place Patient Logbook in the blue and white box. Use locking tab on box and tape box closed  securely. The blue and white box has prepaid postage on it. Please place it in the mailbox as  soon as possible. Your physician should have your test results approximately 7 days after the  monitor has been mailed back to [facility].  Call [place] at  [phone] if you have questions regarding  your ZIO XT patch monitor. Call them immediately if you see an orange light blinking on your  monitor.  If your monitor falls off in less than 4 days, contact our Monitor department at [phone].  If your monitor becomes loose or falls off after 4 days call [employer] at [phone] for  suggestions on securing your monitor.     Signed, Werner Lean, MD  08/14/2022 10:12 AM    [place]

## 2022-08-14 NOTE — Patient Instructions (Signed)
Medication Instructions:  Your physician has recommended you make the following change in your medication:   1) STOP hydrochlorothiazide  *If you need a refill on your cardiac medications before your next appointment, please call your pharmacy*  Lab Work: None  Testing/Procedures: Your physician has requested that you wear a Zio heart monitor for 14 days. This will be mailed to your home with instructions on how to apply the monitor and how to return it when finished. Please allow 2 weeks after returning the heart monitor before our office calls you with the results.   Follow-Up: At [facility], you and your health needs are our priority.  As part of our continuing mission to provide you with exceptional heart care, we have created designated Provider Care Teams.  These Care Teams include your primary Cardiologist (physician) and Advanced Practice Providers (APPs -  Physician Assistants and Nurse Practitioners) who all work together to provide you with the care you need, when you need it.  Your next appointment:   3-4 month(s)  Provider:   Rudean Haskell, MD  Other Instructions Bryn Gulling- Long Term Monitor Instructions     Your physician has requested you wear a ZIO patch monitor for 14 days.  This is a single patch monitor. [employer] supplies one patch monitor per enrollment. Additional  stickers are not available. Please do not apply patch if you will be having a Nuclear Stress Test,  Echocardiogram, Cardiac CT, MRI, or Chest Xray during the period you would be wearing the  monitor. The patch cannot be worn during these tests. You cannot remove and re-apply the  ZIO XT patch monitor.  Your ZIO patch monitor will be mailed 3 day USPS to your address on file. It may take 3-5 days  to receive your monitor after you have been enrolled.  Once you have received your monitor, please review the enclosed instructions. Your monitor  has already been registered assigning a  specific monitor serial # to you.     Billing and Patient Assistance Program Information     We have supplied [employer] with any of your insurance information on file for billing purposes.  [employer] offers a sliding scale Patient Assistance Program for patients that do not have  insurance, or whose insurance does not completely cover the cost of the ZIO monitor.  You must apply for the Patient Assistance Program to qualify for this discounted rate.  To apply, please call [employer] at [phone], select option 4, select option 2, ask to apply for  Patient Assistance Program. Theodore Demark will ask your household income, and how many people  are in your household. They will quote your out-of-pocket cost based on that information.  [employer] will also be able to set up a 59-month interest-free payment plan if needed.     Applying the monitor     Shave hair from upper left chest.  Hold abrader disc by orange tab. Rub abrader in 40 strokes over the upper left chest as  indicated in your monitor instructions.  Clean area with 4 enclosed alcohol pads. Let dry.  Apply patch as indicated in monitor instructions. Patch will be placed under collarbone on left  side of chest with arrow pointing upward.  Rub patch adhesive wings for 2 minutes. Remove white label marked "1". Remove the white  label marked "2". Rub patch adhesive wings for 2 additional minutes.  While looking in a mirror, press and release button in center of patch. A small green light will  flash 3-4 times. This will be your only indicator that the monitor has been turned on.  Do not shower for the first 24 hours. You may shower after the first 24 hours.  Press the button if you feel a symptom. You will hear a small click. Record Date, Time and  Symptom in the Patient Logbook.  When you are ready to remove the patch, follow instructions on the last 2 pages of Patient  Logbook. Stick patch monitor onto the last page of Patient Logbook.  Place  Patient Logbook in the blue and white box. Use locking tab on box and tape box closed  securely. The blue and white box has prepaid postage on it. Please place it in the mailbox as  soon as possible. Your physician should have your test results approximately 7 days after the  monitor has been mailed back to [facility].  Call [place] at [phone] if you have questions regarding  your ZIO XT patch monitor. Call them immediately if you see an orange light blinking on your  monitor.  If your monitor falls off in less than 4 days, contact our Monitor department at [phone].  If your monitor becomes loose or falls off after 4 days call [employer] at [phone] for  suggestions on securing your monitor.

## 2022-08-30 ENCOUNTER — Encounter: Payer: Self-pay | Admitting: Nurse Practitioner

## 2022-08-30 ENCOUNTER — Telehealth: Payer: Self-pay

## 2022-08-30 ENCOUNTER — Other Ambulatory Visit: Payer: Self-pay

## 2022-08-30 ENCOUNTER — Inpatient Hospital Stay: Payer: Medicare HMO | Attending: Nurse Practitioner

## 2022-08-30 ENCOUNTER — Inpatient Hospital Stay (HOSPITAL_BASED_OUTPATIENT_CLINIC_OR_DEPARTMENT_OTHER): Payer: Medicare HMO | Admitting: Nurse Practitioner

## 2022-08-30 VITALS — BP 164/82 | HR 92 | Temp 98.2°F | Resp 15 | Wt 164.9 lb

## 2022-08-30 DIAGNOSIS — Z170 Estrogen receptor positive status [ER+]: Secondary | ICD-10-CM | POA: Diagnosis not present

## 2022-08-30 DIAGNOSIS — M8580 Other specified disorders of bone density and structure, unspecified site: Secondary | ICD-10-CM | POA: Insufficient documentation

## 2022-08-30 DIAGNOSIS — Z90710 Acquired absence of both cervix and uterus: Secondary | ICD-10-CM | POA: Diagnosis not present

## 2022-08-30 DIAGNOSIS — Z79810 Long term (current) use of selective estrogen receptor modulators (SERMs): Secondary | ICD-10-CM | POA: Diagnosis not present

## 2022-08-30 DIAGNOSIS — C50512 Malignant neoplasm of lower-outer quadrant of left female breast: Secondary | ICD-10-CM | POA: Insufficient documentation

## 2022-08-30 LAB — CBC WITH DIFFERENTIAL/PLATELET
Abs Immature Granulocytes: 0.03 10*3/uL (ref 0.00–0.07)
Basophils Absolute: 0 10*3/uL (ref 0.0–0.1)
Basophils Relative: 1 %
Eosinophils Absolute: 0.3 10*3/uL (ref 0.0–0.5)
Eosinophils Relative: 3 %
HCT: 38.7 % (ref 36.0–46.0)
Hemoglobin: 13 g/dL (ref 12.0–15.0)
Immature Granulocytes: 0 %
Lymphocytes Relative: 19 %
Lymphs Abs: 1.5 10*3/uL (ref 0.7–4.0)
MCH: 29.7 pg (ref 26.0–34.0)
MCHC: 33.6 g/dL (ref 30.0–36.0)
MCV: 88.6 fL (ref 80.0–100.0)
Monocytes Absolute: 0.7 10*3/uL (ref 0.1–1.0)
Monocytes Relative: 8 %
Neutro Abs: 5.4 10*3/uL (ref 1.7–7.7)
Neutrophils Relative %: 69 %
Platelets: 240 10*3/uL (ref 150–400)
RBC: 4.37 MIL/uL (ref 3.87–5.11)
RDW: 13.2 % (ref 11.5–15.5)
WBC: 7.9 10*3/uL (ref 4.0–10.5)
nRBC: 0 % (ref 0.0–0.2)

## 2022-08-30 LAB — COMPREHENSIVE METABOLIC PANEL
ALT: 20 U/L (ref 0–44)
AST: 25 U/L (ref 15–41)
Albumin: 4 g/dL (ref 3.5–5.0)
Alkaline Phosphatase: 55 U/L (ref 38–126)
Anion gap: 10 (ref 5–15)
BUN: 14 mg/dL (ref 8–23)
CO2: 26 mmol/L (ref 22–32)
Calcium: 9.3 mg/dL (ref 8.9–10.3)
Chloride: 104 mmol/L (ref 98–111)
Creatinine, Ser: 0.78 mg/dL (ref 0.44–1.00)
GFR, Estimated: >60 mL/min (ref >60)
Glucose, Bld: 247 mg/dL — ABNORMAL HIGH (ref 70–99)
Potassium: 3.7 mmol/L (ref 3.5–5.1)
Sodium: 140 mmol/L (ref 135–145)
Total Bilirubin: 0.3 mg/dL (ref 0.3–1.2)
Total Protein: 7.4 g/dL (ref 6.5–8.1)

## 2022-08-30 MED ORDER — NYSTATIN 100000 UNIT/GM EX POWD: 1.0000 | Freq: Three times a day (TID) | CUTANEOUS | 2 refills | Status: DC

## 2022-08-30 MED ORDER — TAMOXIFEN CITRATE 20 MG PO TABS: 20.0000 mg | ORAL_TABLET | Freq: Every day | ORAL | 10 refills | Status: DC

## 2022-08-30 NOTE — Telephone Encounter (Signed)
Attempted to notify Patient of prior authorization approval for Nystatin Powder. Unable to reach patient and unable to leave voicemail due to mailbox being full. Medication is approved through 06/18/2023.

## 2022-08-30 NOTE — Progress Notes (Signed)
Patient Care Team: Martyn Malay, MD as PCP - General (Family Medicine) Nahser, Wonda Cheng, MD as PCP - Cardiology (Cardiology) Kennith Center, RD as Dietitian (Family Medicine) Mauro Kaufmann, RN as Oncology Nurse Navigator Rockwell Germany, RN as Oncology Nurse Navigator Erroll Luna, MD as Consulting Physician (General Surgery) Truitt Merle, MD as Consulting Physician (Hematology) Eppie Gibson, MD as Attending Physician (Radiation Oncology)   CHIEF COMPLAINT: Follow-up left breast cancer  Oncology History Overview Note  Cancer Staging Malignant neoplasm of lower-outer quadrant of left breast of female, estrogen receptor positive ([place]) Staging form: Breast, AJCC 8th Edition - Clinical stage from 04/25/2021: Stage IIA (cT3, cN0, cM0, G1, ER+, PR+, HER2-) - Signed by Truitt Merle, MD on 04/26/2021    Malignant neoplasm of lower-outer quadrant of left breast of female, estrogen receptor positive ([place])  04/05/2021 Mammogram   Exam: 3D Mammogram Diagnostic Bilateral Digital - Bilateral Breast Ultrasound - Limited Bilateral  IMPRESSION: The irregular mass in the left breast is highly suspicious of malignancy.   04/19/2021 Pathology Results   Diagnosis  Breast, left, needle core biopsy, mass 7x7x4cm BIRADS5  - INVASIVE MAMMARY CARCINOMA  - MAMMARY CARCINOMA IN SITU Microscopic Comment The biopsy material shows an infiltrative proliferation of cells arranged linearly and in small clusters. Based on the biopsy, the carcinoma appears Nottingham grade 1-2 pf 3 and measures 1.6 cm in greatest linear extent.  E-cadherin is POSITIVE supporting a ductal origin  PROGNOSTIC INDICATORS Results: The tumor cells are EQUIVOCAL for Her2 (2+). Her2 by FISH will be performed and results reported separately. Estrogen Receptor: 100%, POSITIVE, STRONG STAINING INTENSITY Progesterone Receptor: 100%, POSITIVE, STRONG STAINING INTENSITY Proliferation Marker Ki67: 5%  FLUORESCENCE IN-SITU  HYBRIDIZATION Results: GROUP 5: HER2 **NEGATIVE**   04/24/2021 Initial Diagnosis   Malignant neoplasm of lower-outer quadrant of left breast of female, estrogen receptor positive ([place])   04/25/2021 Cancer Staging   Staging form: Breast, AJCC 8th Edition - Clinical stage from 04/25/2021: Stage IIA (cT3, cN0, cM0, G1, ER+, PR+, HER2-) - Signed by Truitt Merle, MD on 04/26/2021 Stage prefix: Initial diagnosis Histologic grading system: 3 grade system   05/17/2021 Cancer Staging   Staging form: Breast, AJCC 8th Edition - Pathologic stage from 05/17/2021: Stage IB (pT3, pN0, cM0, G2, ER+, PR+, HER2-) - Signed by Truitt Merle, MD on 07/29/2021 Stage prefix: Initial diagnosis Multigene prognostic tests performed: None Histologic grading system: 3 grade system Residual tumor (R): R0 - None   05/17/2021 Definitive Surgery   FINAL MICROSCOPIC DIAGNOSIS:   A. 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  - Anterior margin free.  Tumor approaches to less than 0.1 cm of inked margin.  - Medial margin widely free of tumor.  - Posterior margin free.  Tumor approaches to 0.2 cm of inked margin.  - Ductal carcinoma in situ.   B. BREAST, LEFT ADDITIONAL LATERAL MARGIN, EXCISION:  - Invasive ductal carcinoma involving new lateral margin.  - Usual ductal hyperplasia and fibrocystic changes.  - Intraductal papilloma.  - Old fibroadenoma.   C. BREAST, LEFT ADDITIONAL SUPERIOR MARGIN, EXCISION:  - No carcinoma identified.  New superior margin free.  - Lobular neoplasia (atypical lobular hyperplasia).  - Complex sclerosing lesion.  - Fibrocystic changes and usual ductal hyperplasia.   D. BREAST, LEFT ADDITIONAL MEDIAL MARGIN, EXCISION:  - New medial margin free.  Invasive ductal carcinoma approaches to 0.8 cm of inked  margin.   E. BREAST, LEFT ADDITIONAL INFERIOR MARGIN, EXCISION:  - Small fragment of  tissue suspicious for malignancy, 0.2 cm from inked margin.  - New inferior margin free.   F. BREAST, LEFT ADDITIONAL POSTERIOR MARGIN, EXCISION:  - New posterior margin free.  Invasive ductal carcinoma approaches to 0.2 cm of inked margin.   G. BREAST, LEFT ADDITIONAL ANTERIOR MARGIN, EXCISION:  - No malignancy identified.   H. LYMPH NODE, LEFT AXILLARY, SENTINEL, EXCISION:  - One lymph node, negative for carcinoma (0/1).   I. LYMPH NODE, LEFT AXILLARY, SENTINEL, EXCISION:  - One lymph node, negative for carcinoma (0/1).   J. LYMPH NODE, LEFT AXILLARY, SENTINEL, EXCISION:  - One lymph node, negative for carcinoma (0/1).   COMMENT:   The final (new) lateral resection margin is positive for carcinoma.  All other final (new) margins are negative.    07/03/2021 - 07/31/2021 Radiation Therapy   Site Technique Total Dose (Gy) Dose per Fx (Gy) Completed Fx Beam Energies  Breast, Left: Breast_L 3D 40.05/40.05 2.67 15/15 6XFFF, 10XFFF  Breast, Left: Breast_L_Bst 3D 10/10 2 5/5 6X, 10X         CURRENT THERAPY: Tamoxifen, started 09/01/2021  INTERVAL HISTORY Ms. Yvonne [name] returns for follow-up as scheduled, last seen by Dr. Burr Medico 03/01/2022.  M[facility] at [facility] 03/14/2022 showed new architectural distortion and postop seroma in the upper central left breast c/w posttreatment changes, no evidence of malignancy, and breast density B.  She was hospitalized in January, underwent aortic valve replacement for severe aortic stenosis.  Energy improved after procedure. Being followed closely by cardiology.  Continues tamoxifen. Has either a hot flash or cold flash in the evening between 5 - 7 pm. Denies joint pain, breast concerns, or any new/specific concerns.   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 Angelena Form & Dr. Cyndia Bent   Severe aortic stenosis    Stroke [facility])    pt was 23     Past Surgical History:  Procedure Laterality Date   ABDOMINAL HYSTERECTOMY     BACK SURGERY     2019   BRAIN SURGERY     age 41's- steel plate in back of head   BREAST LUMPECTOMY WITH RADIOACTIVE SEED AND SENTINEL LYMPH NODE BIOPSY Left 05/17/2021   Procedure: LEFT BREAST SEED LOCALIZED LUMPECTOMY (BRACKETED) WITH SENTINEL LYMPH NODE BIOPSY;  Surgeon: Erroll Luna, MD;  Location: [place];  Service: General;  Laterality: Left;  PEC BLOCK 90 MINUTES ROOM 9   CATARACT EXTRACTION Bilateral    INTRAOPERATIVE TRANSTHORACIC ECHOCARDIOGRAM N/A 06/26/2022   Procedure: INTRAOPERATIVE TRANSTHORACIC ECHOCARDIOGRAM;  Surgeon: Burnell Blanks, MD;  Location: [place];  Service: Open Heart Surgery;  Laterality: N/A;   RE-EXCISION OF BREAST LUMPECTOMY Left 06/08/2021   Procedure: RE-EXCISION OF LEFT BREAST LUMPECTOMY;  Surgeon: Erroll Luna,  MD;  Location: [facility];  Service: General;  Laterality: Left;   RIGHT HEART CATH AND CORONARY ANGIOGRAPHY N/A 05/17/2022   Procedure: RIGHT HEART CATH AND CORONARY ANGIOGRAPHY;  Surgeon: Burnell Blanks, MD;  Location: [place] [facility];  Service: Cardiovascular;  Laterality: N/A;   TOE AMPUTATION  2013   2nd toe on right foot, hammer toe   TONSILLECTOMY     TOOTH EXTRACTION     all teeth removed   TOTAL HIP ARTHROPLASTY Left 03/19/2017   Procedure: LEFT TOTAL HIP ARTHROPLASTY ANTERIOR APPROACH;  Surgeon: Mcarthur Rossetti, MD;  Location: [place];  Service: Orthopedics;  Laterality: Left;   TRANSCATHETER AORTIC VALVE REPLACEMENT, TRANSFEMORAL N/A 06/26/2022   Procedure: TRANSCATHETR AORTIC VALVE REPLACEMENT, TRANSFEMORAL;  Surgeon: Burnell Blanks, MD;  Location: [place];  Service: Open Heart Surgery;  Laterality: N/A;   ULTRASOUND GUIDANCE FOR VASCULAR ACCESS Bilateral 06/26/2022   Procedure: ULTRASOUND GUIDANCE FOR VASCULAR ACCESS;  Surgeon: Burnell Blanks, MD;  Location: [place];  Service: Open Heart Surgery;  Laterality: Bilateral;     Outpatient Encounter Medications as of 08/30/2022  Medication Sig   amitriptyline (ELAVIL) 50 MG tablet Take 1 tablet (50 mg total) by mouth at bedtime.   amLODipine (NORVASC) 5 MG tablet Take 1 tablet (5 mg total) by mouth at bedtime.   aspirin 81 MG chewable tablet Chew 1 tablet (81 mg total) by mouth daily.   atorvastatin (LIPITOR) 20 MG tablet Take 1 tablet (20 mg total) by mouth daily.   benazepril (LOTENSIN) 40 MG tablet Take 1 tablet (40 mg total) by mouth daily.   gabapentin (NEURONTIN) 100 MG capsule 1 tablet in the morning and 2 at night   levothyroxine (SYNTHROID) 88 MCG tablet Take 1 tablet (88 mcg total) by mouth every morning. 30 minutes before food   Misc Natural Products (NEURIVA PO) Take 1 tablet by mouth daily.   Misc Natural Products (OSTEO BI-FLEX ADV TRIPLE ST) TABS Take 1 tablet by mouth in the morning. '60mg'$  of Vitamin C  (Ascorbic Acid); '2mg'$  Manganese (Manganese Sulfate); '35mg'$  Sodium; '1500mg'$  Glucosamine HCI; '100mg'$  5-Loxin (Boswellia Serrata Extract resin); '1103mg'$  (Chondroitin/MSM Complex (Chrondroitin Sulfate, Methylsulfonylmethane, Collagen (Hydrolyzed Gelatin), Boswellia Serrata (resin); Boron (Bororganic Glycine); Hyaluronic Acid   Multiple Vitamins-Minerals (CENTRUM SILVER ADULT 50+) TABS Take 1 tablet by mouth in the morning.   nystatin (MYCOSTATIN/NYSTOP) powder Apply 1 Application topically 3 (three) times daily.   phenylephrine  (NEO-SYNEPHRINE) 1 % nasal spray Place 1 drop into both nostrils 3 (three) times daily as needed for congestion.   venlafaxine XR (EFFEXOR-XR) 37.5 MG 24 hr capsule TAKE 1 CAPSULE EVERY DAY WITH BREAKFAST   [DISCONTINUED] tamoxifen (NOLVADEX) 20 MG tablet TAKE 1 TABLET EVERY DAY   tamoxifen (NOLVADEX) 20 MG tablet Take 1 tablet (20 mg total) by mouth daily.   No facility-administered encounter medications on file as of 08/30/2022.     Today's Vitals   08/30/22 1023  BP: (!) 164/82  Pulse: 92  Resp: 15  Temp: 98.2 F (36.8 C)  TempSrc: Oral  SpO2: 95%  Weight: 164 lb 14.4 oz (74.8 kg)   Body mass index is 31.16 kg/m.   PHYSICAL EXAM GENERAL:alert, no distress and comfortable SKIN: no rash  EYES: sclera clear NECK: without mass LYMPH:  no palpable cervical or supraclavicular lymphadenopathy  LUNGS: clear with normal breathing effort HEART: regular rate & rhythm, no lower extremity edema ABDOMEN: abdomen soft, non-tender and normal bowel sounds NEURO: alert & oriented x 3  with fluent speech, no focal motor/sensory deficits Breast exam: No nipple discharge or inversion.  S/p left lumpectomy, incisions completely healed with moderate scar tissue.  No palpable mass or nodularity in either breast or axilla that I could appreciate. Fungal rash in the right breast fold      CBC    Component Value Date/Time   WBC 7.9 08/30/2022 0959   RBC 4.37 08/30/2022 0959   HGB 13.0 08/30/2022 0959   HGB 13.1 05/04/2022 1344   HCT 38.7 08/30/2022 0959   HCT 41.5 05/04/2022 1344   PLT 240 08/30/2022 0959   PLT 338 05/04/2022 1344   MCV 88.6 08/30/2022 0959   MCV 93 05/04/2022 1344   MCH 29.7 08/30/2022 0959   MCHC 33.6 08/30/2022 0959   RDW 13.2 08/30/2022 0959   RDW 13.5 05/04/2022 1344   LYMPHSABS 1.5 08/30/2022 0959   MONOABS 0.7 08/30/2022 0959   EOSABS 0.3 08/30/2022 0959   BASOSABS 0.0 08/30/2022 0959     CMP     Component Value Date/Time   NA 140 08/30/2022 0959   NA 139  05/04/2022 1344   K 3.7 08/30/2022 0959   CL 104 08/30/2022 0959   CO2 26 08/30/2022 0959   GLUCOSE 247 (H) 08/30/2022 0959   BUN 14 08/30/2022 0959   BUN 12 05/04/2022 1344   CREATININE 0.78 08/30/2022 0959   CREATININE 0.84 04/26/2021 0824   CREATININE 0.83 07/25/2016 1155   CALCIUM 9.3 08/30/2022 0959   PROT 7.4 08/30/2022 0959   PROT 6.9 07/31/2019 0756   ALBUMIN 4.0 08/30/2022 0959   ALBUMIN 4.2 07/31/2019 0756   AST 25 08/30/2022 0959   AST 25 04/26/2021 0824   ALT 20 08/30/2022 0959   ALT 25 04/26/2021 0824   ALKPHOS 55 08/30/2022 0959   BILITOT 0.3 08/30/2022 0959   BILITOT 0.3 04/26/2021 0824   GFRNONAA >60 08/30/2022 0959   GFRNONAA >60 04/26/2021 0824   GFRAA 72 07/31/2019 0756     ASSESSMENT & PLAN:Letecia C Eifert is a 40 follow-up post menopausal female    1. 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 Brantley Stage showed: 5.5 cm IDC with DCIS. Final lateral margin was positive. Other margins and lymph nodes negative. -she received radiation under Dr. Isidore Moos 1/16-2/13/23. -she started tamoxifen on 09/01/21, tolerating well with either hot or cold flash in the evenings.  Side effects are manageable -Breast exam is benign, except fungal rash under the right breast.  Sent in nystatin.  Labs are unremarkable.  Overall no clinical concern for recurrence -Continue breast cancer surveillance and tamoxifen, refilled -Mammogram due 02/2023 -Follow-up in 6 months, or sooner if needed     2. Bone Health -repeat DEXA on 05/01/21 showed osteopenia (T-score of -2.0). -we previously discussed tamoxifen can strengthen her bones.    PLAN: -Labs reviewed -Continue breast cancer surveillance and Tamoxifen, refilled -Mammogram 02/2023, ordered -Rx: Nystatin to right breast fold rash -F/up in 6 months, or sooner if needed   Orders Placed This Encounter   Procedures   MM DIAG BREAST TOMO BILATERAL    Standing Status:   Future    Standing Expiration Date:   08/30/2023    Scheduling Instructions:     [place]    Order Specific Question:   Reason for Exam (SYMPTOM  OR DIAGNOSIS REQUIRED)    Answer:   L breast cancer 2022, s/p lumpectomy and RT;  known seroma from 02/2022 mammo    Order Specific Question:   Preferred imaging location?    Answer:   External      All questions were answered. The patient knows to call the clinic with any problems, questions or concerns. No barriers to learning were detected. I spent 20 minutes counseling the patient face to face. The total time spent in the appointment was 30 minutes and more than 50% was on counseling, review of test results, and coordination of care.   Cira Rue, NP-C 08/30/2022

## 2022-09-03 DIAGNOSIS — R002 Palpitations: Secondary | ICD-10-CM | POA: Diagnosis not present

## 2022-09-13 ENCOUNTER — Telehealth: Payer: Self-pay | Admitting: Internal Medicine

## 2022-09-13 NOTE — Telephone Encounter (Signed)
Spoke with patient and discussed monitor results with her.  Per Dr. Gasper Sells: Testing Results WNL.   If her ambulatory  blood pressure has increased (she had one BP elevated reading 08/30/22 with oncology) we can trial low dose beta blocker. If not,  No change to diagnostic or therapeutic plan.  Patient states she does not check her BP at home. She states she has an aide that comes to help her 3 days a week. She will ask her aide if she can take her BP for her over the next 1-2 weeks and call in the readings to our office.

## 2022-09-13 NOTE — Telephone Encounter (Signed)
Attempted to return call to patient. No answer, mailbox full.

## 2022-09-13 NOTE — Telephone Encounter (Signed)
Pt was returning nurses call from 3/22 and 3/27 and would like a callback regarding results. Please advise

## 2022-09-17 ENCOUNTER — Other Ambulatory Visit: Payer: Self-pay | Admitting: Family Medicine

## 2022-09-17 ENCOUNTER — Telehealth: Payer: Self-pay | Admitting: Internal Medicine

## 2022-09-17 DIAGNOSIS — G629 Polyneuropathy, unspecified: Secondary | ICD-10-CM

## 2022-09-17 DIAGNOSIS — I779 Disorder of arteries and arterioles, unspecified: Secondary | ICD-10-CM

## 2022-09-17 DIAGNOSIS — F339 Major depressive disorder, recurrent, unspecified: Secondary | ICD-10-CM

## 2022-09-17 DIAGNOSIS — I10 Essential (primary) hypertension: Secondary | ICD-10-CM

## 2022-09-17 NOTE — Telephone Encounter (Signed)
Patient called to give BP readings: 3/30  169/80  162/86 3/31 161/84  153/92 4/1 165/84  139/75

## 2022-09-19 MED ORDER — HYDROCHLOROTHIAZIDE 25 MG PO TABS: 25.0000 mg | ORAL_TABLET | Freq: Every day | ORAL | 3 refills | Status: DC

## 2022-09-19 NOTE — Telephone Encounter (Signed)
Spoke with patient to review Dr Wynelle Bourgeois recommendations and place lab order. Patient verbalized understanding and had no questions.

## 2022-10-01 ENCOUNTER — Telehealth: Payer: Self-pay | Admitting: Family Medicine

## 2022-10-01 NOTE — Telephone Encounter (Signed)
Contacted ROBIE ECCLESTON to schedule their annual wellness visit. Appointment made for 10/02/2022.  Thank you,  [facility] Support [facility] Direct dial  [phone]

## 2022-10-02 NOTE — Addendum Note (Signed)
Addended by: Alvin Critchley A on: 10/02/2022 05:03 PM   Modules accepted: Orders

## 2022-10-03 ENCOUNTER — Ambulatory Visit: Payer: Medicare HMO | Attending: Internal Medicine

## 2022-10-03 DIAGNOSIS — I10 Essential (primary) hypertension: Secondary | ICD-10-CM

## 2022-10-04 LAB — BASIC METABOLIC PANEL
BUN/Creatinine Ratio: 18 (ref 12–28)
BUN: 14 mg/dL (ref 8–27)
CO2: 24 mmol/L (ref 20–29)
Calcium: 10.2 mg/dL (ref 8.7–10.3)
Chloride: 102 mmol/L (ref 96–106)
Creatinine, Ser: 0.78 mg/dL (ref 0.57–1.00)
Glucose: 192 mg/dL — ABNORMAL HIGH (ref 70–99)
Potassium: 4.6 mmol/L (ref 3.5–5.2)
Sodium: 140 mmol/L (ref 134–144)
eGFR: 75 mL/min/{1.73_m2} (ref >59)

## 2022-10-08 DIAGNOSIS — Z822 Family history of deafness and hearing loss: Secondary | ICD-10-CM | POA: Diagnosis not present

## 2022-10-08 DIAGNOSIS — H903 Sensorineural hearing loss, bilateral: Secondary | ICD-10-CM | POA: Diagnosis not present

## 2022-10-08 DIAGNOSIS — H9313 Tinnitus, bilateral: Secondary | ICD-10-CM | POA: Diagnosis not present

## 2022-10-08 DIAGNOSIS — E039 Hypothyroidism, unspecified: Secondary | ICD-10-CM | POA: Diagnosis not present

## 2022-10-19 DIAGNOSIS — E669 Obesity, unspecified: Secondary | ICD-10-CM | POA: Diagnosis not present

## 2022-10-19 DIAGNOSIS — E119 Type 2 diabetes mellitus without complications: Secondary | ICD-10-CM | POA: Diagnosis not present

## 2022-10-19 DIAGNOSIS — G629 Polyneuropathy, unspecified: Secondary | ICD-10-CM | POA: Diagnosis not present

## 2022-10-19 DIAGNOSIS — E039 Hypothyroidism, unspecified: Secondary | ICD-10-CM | POA: Diagnosis not present

## 2022-10-19 DIAGNOSIS — E1140 Type 2 diabetes mellitus with diabetic neuropathy, unspecified: Secondary | ICD-10-CM | POA: Diagnosis not present

## 2022-10-19 DIAGNOSIS — I10 Essential (primary) hypertension: Secondary | ICD-10-CM | POA: Diagnosis not present

## 2022-10-19 DIAGNOSIS — F32A Depression, unspecified: Secondary | ICD-10-CM | POA: Diagnosis not present

## 2022-10-19 DIAGNOSIS — Z79899 Other long term (current) drug therapy: Secondary | ICD-10-CM | POA: Diagnosis not present

## 2022-10-19 DIAGNOSIS — E785 Hyperlipidemia, unspecified: Secondary | ICD-10-CM | POA: Diagnosis not present

## 2022-11-06 ENCOUNTER — Encounter: Payer: Self-pay | Admitting: Podiatry

## 2022-11-06 ENCOUNTER — Ambulatory Visit (INDEPENDENT_AMBULATORY_CARE_PROVIDER_SITE_OTHER): Payer: Medicare HMO | Admitting: Podiatry

## 2022-11-06 DIAGNOSIS — G6289 Other specified polyneuropathies: Secondary | ICD-10-CM | POA: Diagnosis not present

## 2022-11-06 DIAGNOSIS — M2011 Hallux valgus (acquired), right foot: Secondary | ICD-10-CM | POA: Diagnosis not present

## 2022-11-06 DIAGNOSIS — M21611 Bunion of right foot: Secondary | ICD-10-CM

## 2022-11-06 DIAGNOSIS — R2689 Other abnormalities of gait and mobility: Secondary | ICD-10-CM

## 2022-11-06 DIAGNOSIS — M2041 Other hammer toe(s) (acquired), right foot: Secondary | ICD-10-CM | POA: Diagnosis not present

## 2022-11-06 NOTE — Patient Instructions (Signed)
Referral sent for physical therapy: [facility] at [facility]  [phone]

## 2022-11-06 NOTE — Progress Notes (Signed)
  Subjective:  Patient ID: Yvonne [name], female    DOB: [birth year],  MRN: [id number]  Chief Complaint  Patient presents with   Peripheral Neuropathy    Burning, tingling, numbness in both feet    82 y.o. female presents with the above complaint. History confirmed with patient.  This is been going on for some time.  She currently takes 100 mg gabapentin 3 times daily.  She is also having some balance issues.  Objective:  Physical Exam: warm, good capillary refill, no trophic changes or ulcerative lesions, normal DP and PT pulses, abnormal sensory exam, and severe hallux valgus deformity with crossover third toe, prior right second toe amputation   Assessment:   1. Other polyneuropathy   2. Balance disorder      Plan:  Patient was evaluated and treated and all questions answered.  We discussed the treatment options of her polyneuropathy.  Currently on a very low-dose of gabapentin.  I did recommend she could begin to increase this gradually 100 mg at a time with her nightly dose until she reaches a comfortable level.  This can be done by her new PCP, this will be communicated to them.  I also agree with physical therapy.  Referral was sent for proprioceptive and balance and gait training.  Return as needed.  Do not think the hallux and forefoot is reconstructable at this point at her age and functional level.  If the third toe threads ulceration would need amputation of this as well.  No follow-ups on file.

## 2022-11-09 ENCOUNTER — Ambulatory Visit: Payer: Medicare HMO | Admitting: Family Medicine

## 2022-11-13 DIAGNOSIS — E039 Hypothyroidism, unspecified: Secondary | ICD-10-CM | POA: Diagnosis not present

## 2022-11-13 DIAGNOSIS — E1140 Type 2 diabetes mellitus with diabetic neuropathy, unspecified: Secondary | ICD-10-CM | POA: Diagnosis not present

## 2022-11-13 DIAGNOSIS — E785 Hyperlipidemia, unspecified: Secondary | ICD-10-CM | POA: Diagnosis not present

## 2022-11-13 DIAGNOSIS — I700 Atherosclerosis of aorta: Secondary | ICD-10-CM | POA: Diagnosis not present

## 2022-11-13 DIAGNOSIS — G629 Polyneuropathy, unspecified: Secondary | ICD-10-CM | POA: Diagnosis not present

## 2022-11-13 DIAGNOSIS — F3341 Major depressive disorder, recurrent, in partial remission: Secondary | ICD-10-CM | POA: Diagnosis not present

## 2022-11-13 DIAGNOSIS — K5900 Constipation, unspecified: Secondary | ICD-10-CM | POA: Diagnosis not present

## 2022-11-13 DIAGNOSIS — I509 Heart failure, unspecified: Secondary | ICD-10-CM | POA: Diagnosis not present

## 2022-11-13 DIAGNOSIS — I10 Essential (primary) hypertension: Secondary | ICD-10-CM | POA: Diagnosis not present

## 2022-11-28 ENCOUNTER — Ambulatory Visit: Payer: Medicare HMO

## 2022-12-10 NOTE — Therapy (Incomplete)
OUTPATIENT PHYSICAL THERAPY EVALUATION   Patient Name: Yvonne [name] MRN: [id number] DOB:1941/03/06, 82 y.o., female Today's Date: 12/11/2022  END OF SESSION:  PT End of Session - 12/11/22 1416     Visit Number 1    Number of Visits 17    Date for PT Re-Evaluation 02/05/23    Authorization Type [employer] medicare    Authorization Time Period auth tbd    Progress Note Due on Visit 10    PT Start Time 1416    PT Stop Time 1502    PT Time Calculation (min) 46 min    Activity Tolerance Patient tolerated treatment well    Behavior During Therapy [facility] for tasks assessed/performed             Past Medical History:  Diagnosis Date   Anemia    Anxiety    Arthritis    Bilateral carotid artery disease (HCC) 02/04/2017   Carotid US 3/22: R 1-39; L 100   Depression, recurrent (HCC) 02/10/2019   Hypertension    Hypothyroidism    Impaired functional mobility, balance, gait, and endurance 09/02/2015   Malignant neoplasm of lower-outer quadrant of left breast of female, estrogen receptor positive (HCC) 04/24/2021   Mild cognitive impairment with memory loss 09/02/2015   Osteopenia    Pre-diabetes    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 Clifton James & Dr. Laneta Simmers   Severe aortic stenosis    Stroke [facility])    pt was 1   Past Surgical History:  Procedure Laterality Date   ABDOMINAL HYSTERECTOMY     BACK SURGERY     2019   BRAIN SURGERY     age 27's- steel plate in back of head   BREAST LUMPECTOMY WITH RADIOACTIVE SEED AND SENTINEL LYMPH NODE BIOPSY Left 05/17/2021   Procedure: LEFT BREAST SEED LOCALIZED LUMPECTOMY (BRACKETED) WITH SENTINEL LYMPH NODE BIOPSY;  Surgeon: Harriette Bouillon, MD;  Location: [facility] OR;  Service: General;  Laterality: Left;  PEC BLOCK 90 MINUTES ROOM 9   CATARACT EXTRACTION Bilateral    INTRAOPERATIVE TRANSTHORACIC ECHOCARDIOGRAM N/A 06/26/2022   Procedure: INTRAOPERATIVE  TRANSTHORACIC ECHOCARDIOGRAM;  Surgeon: Kathleene Hazel, MD;  Location: [facility] OR;  Service: Open Heart Surgery;  Laterality: N/A;   RE-EXCISION OF BREAST LUMPECTOMY Left 06/08/2021   Procedure: RE-EXCISION OF LEFT BREAST LUMPECTOMY;  Surgeon: Harriette Bouillon, MD;  Location: [facility] ORS;  Service: General;  Laterality: Left;   RIGHT HEART CATH AND CORONARY ANGIOGRAPHY N/A 05/17/2022   Procedure: RIGHT HEART CATH AND CORONARY ANGIOGRAPHY;  Surgeon: Kathleene Hazel, MD;  Location: [facility] INVASIVE CV LAB;  Service: Cardiovascular;  Laterality: N/A;   TOE AMPUTATION  2013   2nd toe on right foot, hammer toe   TONSILLECTOMY     TOOTH EXTRACTION     all teeth removed   TOTAL HIP ARTHROPLASTY Left 03/19/2017   Procedure: LEFT TOTAL HIP ARTHROPLASTY ANTERIOR APPROACH;  Surgeon: Kathryne Hitch, MD;  Location: [facility] OR;  Service: Orthopedics;  Laterality: Left;   TRANSCATHETER AORTIC VALVE REPLACEMENT, TRANSFEMORAL N/A 06/26/2022   Procedure: TRANSCATHETR AORTIC VALVE REPLACEMENT, TRANSFEMORAL;  Surgeon: Kathleene Hazel, MD;  Location: [facility] OR;  Service: Open Heart Surgery;  Laterality: N/A;   ULTRASOUND GUIDANCE FOR VASCULAR ACCESS Bilateral 06/26/2022   Procedure: ULTRASOUND GUIDANCE FOR VASCULAR ACCESS;  Surgeon: Kathleene Hazel, MD;  Location: [facility] OR;  Service: Open Heart Surgery;  Laterality: Bilateral;  Patient Active Problem List   Diagnosis Date Noted   Palpitations 08/14/2022   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   Aneurysm of thoracic aorta (HCC) 06/04/2022   Pulmonary nodule 06/04/2022   Thyroid nodule 06/04/2022   Malignant neoplasm of lower-outer quadrant of left breast of female, estrogen receptor positive (HCC) 04/24/2021   Controlled type 2 diabetes mellitus with chronic kidney disease, without long-term current use of insulin (HCC) 11/27/2019   Memory impairment 08/31/2019   Depression,  recurrent (HCC) 02/10/2019   Degenerative spondylolisthesis 06/06/2018   Sensorineural hearing loss (SNHL), 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   Bilateral carotid artery disease (HCC) 02/04/2017   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   Osteopenia 08/30/2014   Severe aortic stenosis 12/12/2009   Hypothyroidism 07/12/2009   INSOMNIA, PERSISTENT 07/12/2009   Essential hypertension 07/12/2009    PCP: No PCP in chart  REFERRING PROVIDER: Edwin Cap, DPM  REFERRING DIAG: G62.89 (ICD-10-CM) - Other polyneuropathy R26.89 (ICD-10-CM) - Balance disorder  Rationale for Evaluation and Treatment: Rehabilitation  THERAPY DIAG:  Unsteadiness on feet  Muscle weakness (generalized)  ONSET DATE: several years  SUBJECTIVE:                                                                                                                                                                                           SUBJECTIVE STATEMENT: Pt accompanied by her friend and aide, Mardelle Matte, with her consent.  Balance is worse in evenings, occasional steadying assist from aide and SPC use for community distances. More difficulty with uneven surfaces (yard). Difficulty with stairs, needs bannister support States she occasionally gets dizzy, also at night, states she has spoken with provider about it and has improved with medication changes. Does endorse tingling in her feet and hands which she attributes to neuropathy and has been ongoing for years. States balance actually seems to be improving recently, has had issues for several years.  Today's observed gait mechanics have been present for at least 10 years per her aide, also describes some previous freezing behavior (several years ago) but this has reportedly resolved  PERTINENT HISTORY:  anxiety/depression, anemia, HTN, hx cancer, osteopenia, s/p TAVR Jan 2024, hx stroke, carotid artery disease, DM2 w/ CKD, HOH, hx brain tumor with surgery   PAIN:  No pain at on eval    PRECAUTIONS: fall risk, hx cancer, cardiovascular hx, neurological hx, HOH  WEIGHT BEARING RESTRICTIONS: No  FALLS:  Has patient fallen in last 6 months? No (had some falls last year)   LIVING ENVIRONMENT: 1 story home, 4STE no rails Lives alone, has an aide Mon-Fri 1130-1330 Has  help with housework, medication (pillbox), meal prep  Has an SPC (most used), QC, RW,  No grab bars    OCCUPATION: retired Chartered loss adjuster  PLOF: assistance w/ ADLs/housework  PATIENT GOALS: improve balance  NEXT MD VISIT: sees PCP every 3 months  OBJECTIVE:   DIAGNOSTIC FINDINGS:  No recent imaging pertaining to this episode   PATIENT SURVEYS:  FOTO deferred on eval given time constraints   COGNITION: Overall cognitive status: Within functional limits for tasks assessed - confounded by hearing impairments, she does endorse some memory issues, aide/friend corroborates/assists with history at times   SENSATION: Light touch intact B UE and LE  Mild incoordination B UE, R seems more affected than L (pt attributes this to prior stroke) Does have lateral deviation of R eye, states this has been present since at least adolescence   POSTURE: mild lean to L, increased kyphosis, increased postural sway in standing (tendency towards posterior)  PALPATION: Not indicated   RANGE OF MOTION:     Active  Right eval Left eval  Shoulder flexion Reduced with compensatory trunk lean to L [facility]  Functional ER combo    Functional IR combo    Knee extension    Ankle dorsiflexion     (Blank rows = not tested) (Key: WFL = within functional limits not formally assessed, * = concordant pain, s = stiffness/stretching sensation, NT = not tested)  Comments:    STRENGTH TESTING:  MMT Right eval Left eval  Shoulder flexion    Shoulder abduction    Elbow flexion    Elbow extension    Grip strength (gross)    Hip flexion    Hip abduction (modified sitting) 5 5  Knee flexion 4+ 4+  Knee  extension 4+ 4+  Ankle dorsiflexion    Ankle plantarflexion     (Blank rows = not tested) (Key: WFL = within functional limits not formally assessed, * = concordant pain, s = stiffness/stretching sensation, NT = not tested)  Comments:     FUNCTIONAL TESTS:  5 times sit to stand: 12.83 sec, no UE support but noted retropulsion, difficulty with forward trunk lean  TUG: 19 sec, no AD, UE support from chair   GAIT: Distance walked: within clinic Assistive device utilized: None Level of assistance: Complete Independence Comments: wide BOS, shuffling steps, tendency for reduced trunk/rotation and arm swing    Dynamic Gait Index:   1. Gait Level Surface; 2  2. Change in Gait Speed; 1  3. Gait With Horizontal Head Turns;2  4. Gait With Vertical Head Turns; 2  5. Gait and Pivot Turn; 1  6. Step Over Obstacle; 1  7. Step Around Obstacles; 2  8. Steps; 1   Grading scale: 3 No impairment; 2 Mild impairment/AD use; 1 Moderate impairment; 0 Severe impairment or unable to perform   Total score: 12/24  Cutoff score for fall risk: </=19/24   TODAY'S TREATMENT:  [facility] Adult PT Treatment:                                                DATE: 12/11/22 Deferred given time constraints  PATIENT EDUCATION:  Education details: Pt education on PT impairments, prognosis, and POC. Informed consent. Rationale for interventions Person educated: Patient and friend/aide Education method: Explanation, Demonstration, Tactile cues, Verbal cues Education comprehension: verbalized understanding, returned demonstration, verbal cues required, tactile cues required, and needs further education    HOME EXERCISE PROGRAM: Deferred given time constraints  ASSESSMENT:  CLINICAL IMPRESSION: Pt is a pleasant 82 year old woman who arrives to PT evaluation on this date for balance issues that  have been ongoing for several years - per her report (corroborated by friend/aide) these issues have actually improved recently, but notes most difficulty as the day goes on with increased fatigue. On exam, pt demonstrates difficulty with dual tasking and challenges during DGI testing. TUG score is indicative of fall risk, as is DGI. In regards to gait impairments as above, pt endorses neurological history (stroke, brain tumor with surgery); unsure if these impairments are attributable to these past conditions as pt states she is no longer following with neurology, although she notes these impairments have been present for at least 10 years (corroborated by friend/aide) and actually seem to be improving. No overt focal weakness, but functional weakness with transfers noted. Pt tolerates exam well, no adverse events. Recommend skilled PT to address aforementioned deficits to improve functional independence/tolerance and reduce fall risk. Pt departs today's session in no acute distress, all voiced questions/concerns addressed appropriately from PT perspective.    OBJECTIVE IMPAIRMENTS: Abnormal gait, decreased activity tolerance, decreased balance, decreased endurance, difficulty walking, improper body mechanics, postural dysfunction, and pain.   ACTIVITY LIMITATIONS: standing, stairs, transfers, bathing, and locomotion level  PARTICIPATION LIMITATIONS: meal prep, cleaning, laundry, driving, and community activity  PERSONAL FACTORS: Age, Time since onset of injury/illness/exacerbation, and 3+ comorbidities: anxiety/depression, HTN, osteopenia, cancer hx, DM2, hx stroke  are also affecting patient's functional outcome.   REHAB POTENTIAL: Fair given chronicity and comorbidities  CLINICAL DECISION MAKING: Evolving/moderate complexity  EVALUATION COMPLEXITY: Moderate   GOALS: Goals reviewed with patient? No  SHORT TERM GOALS: Target date: 01/08/2023    Pt will demonstrate appropriate understanding  and performance of initially prescribed HEP in order to facilitate improved independence with management of symptoms.  Baseline: HEP TBD Goal status: INITIAL   2. Pt will improve at least MCID on FOTO in order to demonstrate improved perception of function due to symptoms.  Baseline: FOTO TBD  Goal status: INITIAL    LONG TERM GOALS: Target date: 02/05/2023   Pt will meet predicted score on FOTO in order to demonstrate improved perception of functional status due to symptoms.  Baseline: FOTO TBD Goal status: INITIAL  2. Pt will be able to perform TUG in less than or equal to 15sec sec in order to indicate reduced risk of falling (cutoff score for fall risk 13.5 sec in community dwelling older adults per [facility] et al, 2000)  Baseline: 19sec  Goal status: INITIAL    3.  Pt will perform Dynamic Gait Index with score greater than or equal to 17/24 in order to indicate reduced fall risk (<19/24 predictive of falls in elderly per [facility] et al 1995, MCID 1.9 per Pardasaney et al 2012)  Baseline: 12/24  Goal status: INITIAL    4. Pt will perform 5xSTS in <12 sec and mechanics grossly WNL in order to demonstrate reduced fall risk and improved functional independence. (MCID of 2.3sec)  Baseline: 12 sec w/o UE support, altered mechanics w/ noted retropulsion and mild shift towards LLE   Goal status: INITIAL   PLAN:  PT FREQUENCY: 2x/week  PT DURATION: 8 weeks  PLANNED INTERVENTIONS: Therapeutic exercises, Therapeutic activity, Neuromuscular re-education, Balance training, Gait training, Patient/Family education, Self Care, Stair training, Vestibular training, Cryotherapy, Taping, Manual therapy, and Re-evaluation.  PLAN FOR NEXT SESSION: establish HEP. FOTO administration. Work on balance with dual tasking. Consider BOS training as pt demonstrates increased sway in static stance.    Ashley Murrain PT, DPT 12/11/2022 5:08 PM    Referring diagnosis? G62.89 (ICD-10-CM) - Other  polyneuropathy R26.89 (ICD-10-CM) - Balance disorder Treatment diagnosis? (if different than referring diagnosis) Unsteadiness on feet  Muscle weakness (generalized) What was this (referring dx) caused by? []  Surgery []  Fall [x]  Ongoing issue []  Arthritis []  Other: ____________  Laterality: []  Rt []  Lt [x]  Both  Check all possible CPT codes:  *CHOOSE 10 OR LESS*    []  97110 (Therapeutic Exercise)  []  92507 (SLP Treatment)  []  82956 (Neuro Re-ed)   []  21308 (Swallowing Treatment)   []  97116 (Gait Training)   []  [id number] (Cognitive Training, 1st 15 minutes) []  97140 (Manual Therapy)   []  97130 (Cognitive Training, each add'l 15 minutes)  []  97164 (Re-evaluation)                              []  Other, List CPT Code ____________  []  97530 (Therapeutic Activities)     []  97535 (Self Care)   [x]  All codes above (97110 - 97535)  []  97012 (Mechanical Traction)  []  97014 (E-stim Unattended)  []  97032 (E-stim manual)  []  97033 (Ionto)  []  97035 (Ultrasound) []  97750 (Physical Performance Training) []  [id number] (Aquatic Therapy) []  97016 (Vasopneumatic Device) []  [id number] (Paraffin) []  97034 (Contrast Bath) []  97597 (Wound Care 1st 20 sq cm) []  97598 (Wound Care each add'l 20 sq cm) []  97760 (Orthotic Fabrication, Fitting, Training Initial) []  [id number] (Prosthetic Management and Training Initial) []  [id number] (Orthotic or Prosthetic Training/ Modification Subsequent)   Addendum for [employer] and certification: Ashley Murrain PT, DPT 12/11/2022 5:26 PM

## 2022-12-11 ENCOUNTER — Ambulatory Visit: Payer: Medicare HMO | Attending: Podiatry | Admitting: Physical Therapy

## 2022-12-11 ENCOUNTER — Encounter: Payer: Self-pay | Admitting: Physical Therapy

## 2022-12-11 ENCOUNTER — Other Ambulatory Visit: Payer: Self-pay

## 2022-12-11 DIAGNOSIS — R2689 Other abnormalities of gait and mobility: Secondary | ICD-10-CM | POA: Diagnosis not present

## 2022-12-11 DIAGNOSIS — R2681 Unsteadiness on feet: Secondary | ICD-10-CM | POA: Diagnosis present

## 2022-12-11 DIAGNOSIS — G6289 Other specified polyneuropathies: Secondary | ICD-10-CM | POA: Diagnosis not present

## 2022-12-11 DIAGNOSIS — M6281 Muscle weakness (generalized): Secondary | ICD-10-CM | POA: Diagnosis present

## 2022-12-11 NOTE — Addendum Note (Signed)
Addended by: Fransisco Hertz A on: 12/11/2022 05:28 PM   Modules accepted: Orders

## 2022-12-17 ENCOUNTER — Ambulatory Visit: Payer: Medicare HMO | Attending: Internal Medicine | Admitting: Internal Medicine

## 2022-12-17 ENCOUNTER — Encounter: Payer: Self-pay | Admitting: Internal Medicine

## 2022-12-17 VITALS — BP 126/58 | HR 85 | Ht 61.0 in | Wt 158.0 lb

## 2022-12-17 DIAGNOSIS — I471 Supraventricular tachycardia, unspecified: Secondary | ICD-10-CM | POA: Insufficient documentation

## 2022-12-17 DIAGNOSIS — I422 Other hypertrophic cardiomyopathy: Secondary | ICD-10-CM

## 2022-12-17 DIAGNOSIS — I350 Nonrheumatic aortic (valve) stenosis: Secondary | ICD-10-CM | POA: Diagnosis not present

## 2022-12-17 DIAGNOSIS — Z952 Presence of prosthetic heart valve: Secondary | ICD-10-CM | POA: Diagnosis not present

## 2022-12-17 MED ORDER — METOPROLOL TARTRATE 25 MG PO TABS: 12.5000 mg | ORAL_TABLET | Freq: Every day | ORAL | 11 refills | Status: DC | PRN

## 2022-12-17 NOTE — Patient Instructions (Signed)
Medication Instructions:  Your physician has recommended you make the following change in your medication:  START: metoprolol tartrate 12.5 mg (half pill) by mouth daily as needed for Palpitations/ funny heart beats.  *If you need a refill on your cardiac medications before your next appointment, please call your pharmacy*   Lab Work: NONE If you have labs (blood work) drawn today and your tests are completely normal, you will receive your results only by: MyChart Message (if you have MyChart) OR A paper copy in the mail If you have any lab test that is abnormal or we need to change your treatment, we will call you to review the results.   Testing/Procedures: NONE   Follow-Up: At [facility], you and your health needs are our priority.  As part of our continuing mission to provide you with exceptional heart care, we have created designated Provider Care Teams.  These Care Teams include your primary Cardiologist (physician) and Advanced Practice Providers (APPs -  Physician Assistants and Nurse Practitioners) who all work together to provide you with the care you need, when you need it.  We recommend signing up for the patient portal called "MyChart".  Sign up information is provided on this After Visit Summary.  MyChart is used to connect with patients for Virtual Visits (Telemedicine).  Patients are able to view lab/test results, encounter notes, upcoming appointments, etc.  Non-urgent messages can be sent to your provider as well.   To learn more about what you can do with MyChart, go to [email or web address].    Your next appointment:   1 year(s)  Provider:   Kristeen Miss, MD  or Riley Lam, MD
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
## 2023-01-03 ENCOUNTER — Telehealth: Payer: Self-pay | Admitting: Physical Therapy

## 2023-01-03 NOTE — Telephone Encounter (Signed)
Attempted to contact patient to confirm 01/04/23 appointment. Unable to leave voicemail. Mail box full.

## 2023-01-04 ENCOUNTER — Encounter: Payer: Self-pay | Admitting: Physical Therapy

## 2023-01-04 ENCOUNTER — Ambulatory Visit: Payer: Medicare HMO | Attending: Podiatry | Admitting: Physical Therapy

## 2023-01-04 DIAGNOSIS — M6281 Muscle weakness (generalized): Secondary | ICD-10-CM | POA: Diagnosis present

## 2023-01-04 DIAGNOSIS — R2681 Unsteadiness on feet: Secondary | ICD-10-CM | POA: Insufficient documentation

## 2023-01-04 NOTE — Therapy (Signed)
OUTPATIENT PHYSICAL THERAPY EVALUATION   Patient Name: Yvonne [name] MRN: [id number] DOB:[birth year], 82 y.o., female Today's Date: 01/04/2023  END OF SESSION:  PT End of Session - 01/04/23 1155     Visit Number 2    Number of Visits 17    Date for PT Re-Evaluation 02/05/23    Authorization Type [employer]; approved 12 visits    Authorization Time Period 12/11/22-01/26/23    Authorization - Visit Number 1    Authorization - Number of Visits 12    Progress Note Due on Visit 10    PT Start Time 1155    PT Stop Time 1233    PT Time Calculation (min) 38 min             Past Medical History:  Diagnosis Date   Anemia    Anxiety    Arthritis    Bilateral carotid artery disease (HCC) 02/04/2017   Carotid US 3/22: R 1-39; L 100   Depression, recurrent (HCC) 02/10/2019   Hypertension    Hypothyroidism    Impaired functional mobility, balance, gait, and endurance 09/02/2015   Malignant neoplasm of lower-outer quadrant of left breast of female, estrogen receptor positive (HCC) 04/24/2021   Mild cognitive impairment with memory loss 09/02/2015   Osteopenia    Pre-diabetes    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 Clifton James & Dr. Laneta Simmers   Severe aortic stenosis    Stroke [facility])    pt was 64   Past Surgical History:  Procedure Laterality Date   ABDOMINAL HYSTERECTOMY     BACK SURGERY     2019   BRAIN SURGERY     age 66's- steel plate in back of head   BREAST LUMPECTOMY WITH RADIOACTIVE SEED AND SENTINEL LYMPH NODE BIOPSY Left 05/17/2021   Procedure: LEFT BREAST SEED LOCALIZED LUMPECTOMY (BRACKETED) WITH SENTINEL LYMPH NODE BIOPSY;  Surgeon: Harriette Bouillon, MD;  Location: [facility] OR;  Service: General;  Laterality: Left;  PEC BLOCK 90 MINUTES ROOM 9   CATARACT EXTRACTION Bilateral    INTRAOPERATIVE TRANSTHORACIC ECHOCARDIOGRAM N/A 06/26/2022   Procedure: INTRAOPERATIVE TRANSTHORACIC  ECHOCARDIOGRAM;  Surgeon: Kathleene Hazel, MD;  Location: [facility] OR;  Service: Open Heart Surgery;  Laterality: N/A;   RE-EXCISION OF BREAST LUMPECTOMY Left 06/08/2021   Procedure: RE-EXCISION OF LEFT BREAST LUMPECTOMY;  Surgeon: Harriette Bouillon, MD;  Location: [facility] ORS;  Service: General;  Laterality: Left;   RIGHT HEART CATH AND CORONARY ANGIOGRAPHY N/A 05/17/2022   Procedure: RIGHT HEART CATH AND CORONARY ANGIOGRAPHY;  Surgeon: Kathleene Hazel, MD;  Location: [facility] INVASIVE CV LAB;  Service: Cardiovascular;  Laterality: N/A;   TOE AMPUTATION  2013   2nd toe on right foot, hammer toe   TONSILLECTOMY     TOOTH EXTRACTION     all teeth removed   TOTAL HIP ARTHROPLASTY Left 03/19/2017   Procedure: LEFT TOTAL HIP ARTHROPLASTY ANTERIOR APPROACH;  Surgeon: Kathryne Hitch, MD;  Location: [facility] OR;  Service: Orthopedics;  Laterality: Left;   TRANSCATHETER AORTIC VALVE REPLACEMENT, TRANSFEMORAL N/A 06/26/2022   Procedure: TRANSCATHETR AORTIC VALVE REPLACEMENT, TRANSFEMORAL;  Surgeon: Kathleene Hazel, MD;  Location: [facility] OR;  Service: Open Heart Surgery;  Laterality: N/A;   ULTRASOUND GUIDANCE FOR VASCULAR ACCESS Bilateral 06/26/2022   Procedure: ULTRASOUND GUIDANCE FOR VASCULAR ACCESS;  Surgeon: Kathleene Hazel, MD;  Location: [facility] OR;  Service: Open Heart Surgery;  Laterality: Bilateral;  Patient Active Problem List   Diagnosis Date Noted   SVT (supraventricular tachycardia) 12/17/2022   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   Aneurysm of thoracic aorta (HCC) 06/04/2022   Pulmonary nodule 06/04/2022   Thyroid nodule 06/04/2022   Malignant neoplasm of lower-outer quadrant of left breast of female, estrogen receptor positive (HCC) 04/24/2021   Controlled type 2 diabetes mellitus with chronic kidney disease, without long-term current use of insulin (HCC) 11/27/2019   Memory impairment 08/31/2019    Depression, recurrent (HCC) 02/10/2019   Degenerative spondylolisthesis 06/06/2018   Sensorineural hearing loss (SNHL), 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   Bilateral carotid artery disease (HCC) 02/04/2017   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   Osteopenia 08/30/2014   Severe aortic stenosis 12/12/2009   Hypothyroidism 07/12/2009   INSOMNIA, PERSISTENT 07/12/2009   Essential hypertension 07/12/2009    PCP: No PCP in chart  REFERRING PROVIDER: Edwin Cap, DPM  REFERRING DIAG: G62.89 (ICD-10-CM) - Other polyneuropathy R26.89 (ICD-10-CM) - Balance disorder  Rationale for Evaluation and Treatment: Rehabilitation  THERAPY DIAG:  Unsteadiness on feet  Muscle weakness (generalized)  ONSET DATE: several years  SUBJECTIVE:                                                                                                                                                                                           SUBJECTIVE STATEMENT: Pt accompanied by her friend and aide, Mardelle Matte, with her consent. Pt reports generally feeling good today. Wants to improve ankle strength.     EVAL: Balance is worse in evenings, occasional steadying assist from aide and SPC use for community distances. More difficulty with uneven surfaces (yard). Difficulty with stairs, needs bannister support States she occasionally gets dizzy, also at night, states she has spoken with provider about it and has improved with medication changes. Does endorse tingling in her feet and hands which she attributes to neuropathy and has been ongoing for years. States balance actually seems to be improving recently, has had issues for several years.  Today's observed gait mechanics have been present for at least 10 years per her aide, also describes some previous freezing behavior (several years ago) but this has reportedly resolved  PERTINENT HISTORY:  anxiety/depression, anemia, HTN, hx cancer, osteopenia, s/p TAVR Jan 2024, hx stroke, carotid artery  disease, DM2 w/ CKD, HOH, hx brain tumor with surgery   PAIN:  No pain   PRECAUTIONS: fall risk, hx cancer, cardiovascular hx, neurological hx, HOH  WEIGHT BEARING RESTRICTIONS: No  FALLS:  Has patient fallen in last 6 months? No (had some falls last year)   LIVING ENVIRONMENT:  1 story home, 4STE no rails Lives alone, has an aide Mon-Fri 1130-1330 Has help with housework, medication (pillbox), meal prep  Has an SPC (most used), QC, RW,  No grab bars    OCCUPATION: retired Chartered loss adjuster  PLOF: assistance w/ ADLs/housework  PATIENT GOALS: improve balance  NEXT MD VISIT: sees PCP every 3 months  OBJECTIVE:   DIAGNOSTIC FINDINGS:  No recent imaging pertaining to this episode   PATIENT SURVEYS:  FOTO deferred on eval given time constraints 01/04/23: Deferred- system down   COGNITION: Overall cognitive status: Within functional limits for tasks assessed - confounded by hearing impairments, she does endorse some memory issues, aide/friend corroborates/assists with history at times   SENSATION: Light touch intact B UE and LE  Mild incoordination B UE, R seems more affected than L (pt attributes this to prior stroke) Does have lateral deviation of R eye, states this has been present since at least adolescence   POSTURE: mild lean to L, increased kyphosis, increased postural sway in standing (tendency towards posterior)  PALPATION: Not indicated   RANGE OF MOTION:     Active  Right eval Left eval  Shoulder flexion Reduced with compensatory trunk lean to L [facility]  Functional ER combo    Functional IR combo    Knee extension    Ankle dorsiflexion     (Blank rows = not tested) (Key: WFL = within functional limits not formally assessed, * = concordant pain, s = stiffness/stretching sensation, NT = not tested)  Comments:    STRENGTH TESTING:  MMT Right eval Left eval  Shoulder flexion    Shoulder abduction    Elbow flexion    Elbow extension    Grip  strength (gross)    Hip flexion    Hip abduction (modified sitting) 5 5  Knee flexion 4+ 4+  Knee extension 4+ 4+  Ankle dorsiflexion    Ankle plantarflexion     (Blank rows = not tested) (Key: WFL = within functional limits not formally assessed, * = concordant pain, s = stiffness/stretching sensation, NT = not tested)  Comments:     FUNCTIONAL TESTS:  5 times sit to stand: 12.83 sec, no UE support but noted retropulsion, difficulty with forward trunk lean  TUG: 19 sec, no AD, UE support from chair  01/04/23: SLS 5 sec each; Tandem stance 10 sec each    GAIT: Distance walked: within clinic Assistive device utilized: None Level of assistance: Complete Independence Comments: wide BOS, shuffling steps, tendency for reduced trunk/rotation and arm swing    Dynamic Gait Index:   1. Gait Level Surface; 2  2. Change in Gait Speed; 1  3. Gait With Horizontal Head Turns;2  4. Gait With Vertical Head Turns; 2  5. Gait and Pivot Turn; 1  6. Step Over Obstacle; 1  7. Step Around Obstacles; 2  8. Steps; 1   Grading scale: 3 No impairment; 2 Mild impairment/AD use; 1 Moderate impairment; 0 Severe impairment or unable to perform   Total score: 12/24  Cutoff score for fall risk: </=19/24   TODAY'S TREATMENT:  [facility] Adult PT Treatment:                                                DATE: 01/04/23 Therapeutic Exercise: STS with moderate cues for weight shift/ reach forward for correct mechanics - 10 x 2  Standing heel raises 10 x 2  Standing toe raises 10 x 2 Standing hip abduction x 10 each Standing march x 10 each  Bridge x 10  Tandem stance 10 sec each SLS 5 sec each  Therapeutic Activity: Forward gait along counter top 4 passes with cues for step length/ heel strike Side stepping at counter top 4 passes with UE Gait with CGA and SPC placed in left UE- min  cues required, improved step length     [facility] Adult PT Treatment:                                                DATE: 12/11/22 Deferred given time constraints  PATIENT EDUCATION:  Education details: Pt education on PT impairments, prognosis, and POC. Informed consent. Rationale for interventions Person educated: Patient and friend/aide Education method: Explanation, Demonstration, Tactile cues, Verbal cues Education comprehension: verbalized understanding, returned demonstration, verbal cues required, tactile cues required, and needs further education    HOME EXERCISE PROGRAM: Deferred given time constraints  ASSESSMENT:  CLINICAL IMPRESSION: Pt arrives 3 weeks after initial evaluation for her first treatment. She was not issued an HEP on first visit. Session focused on establishing HEP and improving gait mechanics.  Pt reports no pain on arrival, no AD. Worked on Holiday representative top UE assist. Also,trial of SPC in left hand, which  patient demonstrated improved step length. She was encouraged to practice gait with SPC at home in the left hand. She was given an HEP. No complaints at end of session.     EVAL: Pt arrives on this date for balance issues that have been ongoing for several years - per her report (corroborated by friend/aide) these issues have actually improved recently, but notes most difficulty as the day goes on with increased fatigue. On exam, pt demonstrates difficulty with dual tasking and challenges during DGI testing. TUG score is indicative of fall risk, as is DGI. In regards to gait impairments as above, pt endorses neurological history (stroke, brain tumor with surgery); unsure if these impairments are attributable to these past conditions as pt states she is no longer following with neurology, although she notes these impairments have been present for at least 10 years (corroborated by friend/aide) and actually seem to be improving. No overt focal weakness,  but functional weakness with transfers noted. Pt tolerates exam well, no adverse events. Recommend skilled PT to address aforementioned deficits to improve functional independence/tolerance and reduce fall risk. Pt departs today's session in no acute distress, all voiced questions/concerns addressed appropriately from PT perspective.    OBJECTIVE IMPAIRMENTS: Abnormal gait, decreased activity tolerance, decreased balance, decreased endurance, difficulty walking, improper body mechanics, postural dysfunction, and pain.   ACTIVITY LIMITATIONS: standing, stairs, transfers, bathing, and locomotion level  PARTICIPATION LIMITATIONS: meal prep, cleaning, laundry, driving, and community activity  PERSONAL FACTORS: Age, Time since onset of injury/illness/exacerbation, and 3+ comorbidities: anxiety/depression, HTN, osteopenia, cancer hx, DM2, hx  stroke  are also affecting patient's functional outcome.   REHAB POTENTIAL: Fair given chronicity and comorbidities  CLINICAL DECISION MAKING: Evolving/moderate complexity  EVALUATION COMPLEXITY: Moderate   GOALS: Goals reviewed with patient? No  SHORT TERM GOALS: Target date: 01/08/2023    Pt will demonstrate appropriate understanding and performance of initially prescribed HEP in order to facilitate improved independence with management of symptoms.  Baseline: HEP TBD Goal status: INITIAL   2. Pt will improve at least MCID on FOTO in order to demonstrate improved perception of function due to symptoms.  Baseline: FOTO TBD  Goal status: INITIAL    LONG TERM GOALS: Target date: 02/05/2023   Pt will meet predicted score on FOTO in order to demonstrate improved perception of functional status due to symptoms.  Baseline: FOTO TBD Goal status: INITIAL  2. Pt will be able to perform TUG in less than or equal to 15sec sec in order to indicate reduced risk of falling (cutoff score for fall risk 13.5 sec in community dwelling older adults per [facility] et  al, 2000)  Baseline: 19sec  Goal status: INITIAL    3.  Pt will perform Dynamic Gait Index with score greater than or equal to 17/24 in order to indicate reduced fall risk (<19/24 predictive of falls in elderly per [facility] et al 1995, MCID 1.9 per Pardasaney et al 2012)  Baseline: 12/24  Goal status: INITIAL    4. Pt will perform 5xSTS in <12 sec and mechanics grossly WNL in order to demonstrate reduced fall risk and improved functional independence. (MCID of 2.3sec)  Baseline: 12 sec w/o UE support, altered mechanics w/ noted retropulsion and mild shift towards LLE   Goal status: INITIAL   PLAN:  PT FREQUENCY: 2x/week  PT DURATION: 8 weeks  PLANNED INTERVENTIONS: Therapeutic exercises, Therapeutic activity, Neuromuscular re-education, Balance training, Gait training, Patient/Family education, Self Care, Stair training, Vestibular training, Cryotherapy, Taping, Manual therapy, and Re-evaluation.  PLAN FOR NEXT SESSION: establish HEP. FOTO administration?. Work on balance with dual tasking. Consider BOS training as pt demonstrates increased sway in static stance.    Ashley Murrain PT, DPT 01/04/2023 1:50 PM    Referring diagnosis? G62.89 (ICD-10-CM) - Other polyneuropathy R26.89 (ICD-10-CM) - Balance disorder Treatment diagnosis? (if different than referring diagnosis) Unsteadiness on feet  Muscle weakness (generalized) What was this (referring dx) caused by? []  Surgery []  Fall [x]  Ongoing issue []  Arthritis []  Other: ____________  Laterality: []  Rt []  Lt [x]  Both  Check all possible CPT codes:  *CHOOSE 10 OR LESS*    []  41324 (Therapeutic Exercise)  []  92507 (SLP Treatment)  []  97112 (Neuro Re-ed)   []  92526 (Swallowing Treatment)   []  97116 (Gait Training)   []  [id number] (Cognitive Training, 1st 15 minutes) []  97140 (Manual Therapy)   []  97130 (Cognitive Training, each add'l 15 minutes)  []  97164 (Re-evaluation)                              []  Other, List CPT Code  ____________  []  97530 (Therapeutic Activities)     []  97535 (Self Care)   [x]  All codes above (97110 - 97535)  []  97012 (Mechanical Traction)  []  97014 (E-stim Unattended)  []  97032 (E-stim manual)  []  97033 (Ionto)  []  97035 (Ultrasound) []  97750 (Physical Performance Training) []  [id number] (Aquatic Therapy) []  97016 (Vasopneumatic Device) []  [id number] (Paraffin) []  97034 (Contrast Bath) []  97597 (Wound Care 1st 20 sq cm) []   60454 (Wound Care each add'l 20 sq cm) []  97760 (Orthotic Fabrication, Fitting, Training Initial) []  [id number] (Prosthetic Management and Training Initial) []  [id number] (Orthotic or Prosthetic Training/ Modification Subsequent)   Addendum for [employer] and certification: Jannette Spanner, PTA 01/04/23 1:50 PM Phone: [phone] Fax: [phone]

## 2023-01-09 NOTE — Therapy (Signed)
OUTPATIENT PHYSICAL THERAPY TREATMENT   Patient Name: Yvonne [name] MRN: [id number] DOB:[birth year], 82 y.o., female Today's Date: 01/10/2023  END OF SESSION:  PT End of Session - 01/10/23 1105     Visit Number 3    Number of Visits 17    Date for PT Re-Evaluation 02/05/23    Authorization Type [employer]; approved 12 visits    Authorization Time Period 12/11/22-01/26/23    Authorization - Visit Number 2    Authorization - Number of Visits 12    PT Start Time 1107   late check in   PT Stop Time 1144    PT Time Calculation (min) 37 min    Activity Tolerance Patient tolerated treatment well    Behavior During Therapy [facility] for tasks assessed/performed              Past Medical History:  Diagnosis Date   Anemia    Anxiety    Arthritis    Bilateral carotid artery disease (HCC) 02/04/2017   Carotid US 3/22: R 1-39; L 100   Depression, recurrent (HCC) 02/10/2019   Hypertension    Hypothyroidism    Impaired functional mobility, balance, gait, and endurance 09/02/2015   Malignant neoplasm of lower-outer quadrant of left breast of female, estrogen receptor positive (HCC) 04/24/2021   Mild cognitive impairment with memory loss 09/02/2015   Osteopenia    Pre-diabetes    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 Clifton James & Dr. Laneta Simmers   Severe aortic stenosis    Stroke [facility])    pt was 49   Past Surgical History:  Procedure Laterality Date   ABDOMINAL HYSTERECTOMY     BACK SURGERY     2019   BRAIN SURGERY     age 72's- steel plate in back of head   BREAST LUMPECTOMY WITH RADIOACTIVE SEED AND SENTINEL LYMPH NODE BIOPSY Left 05/17/2021   Procedure: LEFT BREAST SEED LOCALIZED LUMPECTOMY (BRACKETED) WITH SENTINEL LYMPH NODE BIOPSY;  Surgeon: Harriette Bouillon, MD;  Location: [facility] OR;  Service: General;  Laterality: Left;  PEC BLOCK 90 MINUTES ROOM 9   CATARACT EXTRACTION Bilateral     INTRAOPERATIVE TRANSTHORACIC ECHOCARDIOGRAM N/A 06/26/2022   Procedure: INTRAOPERATIVE TRANSTHORACIC ECHOCARDIOGRAM;  Surgeon: Kathleene Hazel, MD;  Location: [facility] OR;  Service: Open Heart Surgery;  Laterality: N/A;   RE-EXCISION OF BREAST LUMPECTOMY Left 06/08/2021   Procedure: RE-EXCISION OF LEFT BREAST LUMPECTOMY;  Surgeon: Harriette Bouillon, MD;  Location: [facility] ORS;  Service: General;  Laterality: Left;   RIGHT HEART CATH AND CORONARY ANGIOGRAPHY N/A 05/17/2022   Procedure: RIGHT HEART CATH AND CORONARY ANGIOGRAPHY;  Surgeon: Kathleene Hazel, MD;  Location: [facility] INVASIVE CV LAB;  Service: Cardiovascular;  Laterality: N/A;   TOE AMPUTATION  2013   2nd toe on right foot, hammer toe   TONSILLECTOMY     TOOTH EXTRACTION     all teeth removed   TOTAL HIP ARTHROPLASTY Left 03/19/2017   Procedure: LEFT TOTAL HIP ARTHROPLASTY ANTERIOR APPROACH;  Surgeon: Kathryne Hitch, MD;  Location: [facility] OR;  Service: Orthopedics;  Laterality: Left;   TRANSCATHETER AORTIC VALVE REPLACEMENT, TRANSFEMORAL N/A 06/26/2022   Procedure: TRANSCATHETR AORTIC VALVE REPLACEMENT, TRANSFEMORAL;  Surgeon: Kathleene Hazel, MD;  Location: [facility] OR;  Service: Open Heart Surgery;  Laterality: N/A;   ULTRASOUND GUIDANCE FOR VASCULAR ACCESS Bilateral 06/26/2022   Procedure: ULTRASOUND GUIDANCE FOR VASCULAR ACCESS;  Surgeon: Verne Carrow  D, MD;  Location: [facility] OR;  Service: Open Heart Surgery;  Laterality: Bilateral;   Patient Active Problem List   Diagnosis Date Noted   SVT (supraventricular tachycardia) 12/17/2022   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   Aneurysm of thoracic aorta (HCC) 06/04/2022   Pulmonary nodule 06/04/2022   Thyroid nodule 06/04/2022   Malignant neoplasm of lower-outer quadrant of left breast of female, estrogen receptor positive (HCC) 04/24/2021   Controlled type 2 diabetes mellitus with chronic kidney disease,  without long-term current use of insulin (HCC) 11/27/2019   Memory impairment 08/31/2019   Depression, recurrent (HCC) 02/10/2019   Degenerative spondylolisthesis 06/06/2018   Sensorineural hearing loss (SNHL), 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   Bilateral carotid artery disease (HCC) 02/04/2017   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   Osteopenia 08/30/2014   Severe aortic stenosis 12/12/2009   Hypothyroidism 07/12/2009   INSOMNIA, PERSISTENT 07/12/2009   Essential hypertension 07/12/2009    PCP: No PCP in chart  REFERRING PROVIDER: Edwin Cap, DPM  REFERRING DIAG: G62.89 (ICD-10-CM) - Other polyneuropathy R26.89 (ICD-10-CM) - Balance disorder  Rationale for Evaluation and Treatment: Rehabilitation  THERAPY DIAG:  Unsteadiness on feet  Muscle weakness (generalized)  ONSET DATE: several years  SUBJECTIVE:                                                                                                                                                                                           SUBJECTIVE STATEMENT: 01/10/2023 arrives unaccompanied today although her caregiver Mardelle Matte arrives near end of session. No pain or soreness after last session, describes some stiffness due to weather. Feels her balance is getting a bit better, hasn't had any falls.     EVAL: Balance is worse in evenings, occasional steadying assist from aide and SPC use for community distances. More difficulty with uneven surfaces (yard). Difficulty with stairs, needs bannister support States she occasionally gets dizzy, also at night, states she has spoken with provider about it and has improved with medication changes. Does endorse tingling in her feet and hands which she attributes to neuropathy and has been ongoing for years. States balance actually seems to be improving recently, has had issues for several years.  Today's observed gait mechanics have been present for at least 10 years per her aide, also describes some previous  freezing behavior (several years ago) but this has reportedly resolved  PERTINENT HISTORY:  anxiety/depression, anemia, HTN, hx cancer, osteopenia, s/p TAVR Jan 2024, hx stroke, carotid artery disease, DM2 w/ CKD, HOH, hx brain tumor with surgery   PAIN:  No pain   PRECAUTIONS: fall risk,  hx cancer, cardiovascular hx, neurological hx, HOH  WEIGHT BEARING RESTRICTIONS: No  FALLS:  Has patient fallen in last 6 months? 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  PLOF: assistance w/ ADLs/housework  PATIENT GOALS: improve balance  NEXT MD VISIT: sees PCP every 3 months  OBJECTIVE: (objective measures completed at initial evaluation unless otherwise dated)   DIAGNOSTIC FINDINGS:  No recent imaging pertaining to this episode   PATIENT SURVEYS:  FOTO deferred on eval given time constraints 01/04/23: Deferred- system down   COGNITION: Overall cognitive status: Within functional limits for tasks assessed - confounded by hearing impairments, she does endorse some memory issues, aide/friend corroborates/assists with history at times   SENSATION: Light touch intact B UE and LE  Mild incoordination B UE, R seems more affected than L (pt attributes this to prior stroke) Does have lateral deviation of R eye, states this has been present since at least adolescence   POSTURE: mild lean to L, increased kyphosis, increased postural sway in standing (tendency towards posterior)  PALPATION: Not indicated   RANGE OF MOTION:     Active  Right eval Left eval  Shoulder flexion Reduced with compensatory trunk lean to L [facility]  Functional ER combo    Functional IR combo    Knee extension    Ankle dorsiflexion     (Blank rows = not tested) (Key: WFL = within  functional limits not formally assessed, * = concordant pain, s = stiffness/stretching sensation, NT = not tested)  Comments:    STRENGTH TESTING:  MMT Right eval Left eval  Shoulder flexion    Shoulder abduction    Elbow flexion    Elbow extension    Grip strength (gross)    Hip flexion    Hip abduction (modified sitting) 5 5  Knee flexion 4+ 4+  Knee extension 4+ 4+  Ankle dorsiflexion    Ankle plantarflexion     (Blank rows = not tested) (Key: WFL = within functional limits not formally assessed, * = concordant pain, s = stiffness/stretching sensation, NT = not tested)  Comments:     FUNCTIONAL TESTS:  5 times sit to stand: 12.83 sec, no UE support but noted retropulsion, difficulty with forward trunk lean  TUG: 19 sec, no AD, UE support from chair  01/04/23: SLS 5 sec each; Tandem stance 10 sec each    GAIT: Distance walked: within clinic Assistive device utilized: None Level of assistance: Complete Independence Comments: wide BOS, shuffling steps, tendency for reduced trunk/rotation and arm swing    Dynamic Gait Index:   1. Gait Level Surface; 2  2. Change in Gait Speed; 1  3. Gait With Horizontal Head Turns;2  4. Gait With Vertical Head Turns; 2  5. Gait and Pivot Turn; 1  6. Step Over Obstacle; 1  7. Step Around Obstacles; 2  8. Steps; 1   Grading scale: 3 No impairment; 2 Mild impairment/AD use; 1 Moderate impairment; 0 Severe impairment or unable to perform   Total score: 12/24  Cutoff score for fall risk: </=19/24   TODAY'S TREATMENT:  [facility] Adult PT Treatment:                                                DATE: 01/10/23 Therapeutic Exercise: STS 2x10 cues for trunk mechanics, pacing, and weight shifting (noted tendency for posterior LOB) Standing heel raise at counter 2x10  Standing hip abduction x10 BIL  Gait Training 6  cones, cone weaving x2 laps with close CGA intermittent LOB towards  6 cones weaving 2 laps with seated rest in between for velocity, 37 sec first trial 2 errors close CGA, 28sec second set with 1 error Turn practice (2 cones) 3 laps, cues for velocity, foot strike, pacing, increased radius  Education on safety w/ gait, compensatory strategies to mitigate fall risk (limiting backwards walking, increasing turn radius and reducing velocity) with teach back method (limited recall)    [facility] Adult PT Treatment:                                                DATE: 01/04/23 Therapeutic Exercise: STS with moderate cues for weight shift/ reach forward for correct mechanics - 10 x 2  Standing heel raises 10 x 2  Standing toe raises 10 x 2 Standing hip abduction x 10 each Standing march x 10 each  Bridge x 10  Tandem stance 10 sec each SLS 5 sec each  Therapeutic Activity: Forward gait along counter top 4 passes with cues for step length/ heel strike Side stepping at counter top 4 passes with UE Gait with CGA and SPC placed in left UE- min cues required, improved step length     [facility] Adult PT Treatment:                                                DATE: 12/11/22 Deferred given time constraints  PATIENT EDUCATION:  Education details: rationale for interventions Person educated: Patient and friend/aide Education method: Explanation, Demonstration, Tactile cues, Verbal cues Education comprehension: verbalized understanding, returned demonstration, verbal cues required, tactile cues required, and needs further education    HOME EXERCISE PROGRAM: Administered 01/04/23  ASSESSMENT:  CLINICAL IMPRESSION: 01/10/2023 Pt arrives w/ report of general stiffness she attributes to weather. Today focusing more so on balance training and gait mechanics, noted improvement with repetition for cone weaving and reduced errors with practice. Pt does have a couple near LOB during gait training requiring minA from  PT to correct. Has a full LOB while backing up to mat to sit back down during rest break - requires modA from PT to prevent fall, pt demonstrates delayed stepping response. Significant time spent educating on appropriate gait mechanics, sequencing, and strategies to reduce fall risk. No adverse events, pt tolerates session well with seated rest breaks. Recommend continuing along current POC in order to address relevant deficits and improve functional tolerance. Pt departs today's session in no acute distress, all voiced questions/concerns addressed appropriately from PT perspective.     EVAL: Pt arrives on this date for balance issues that have been ongoing for several years - per her report (corroborated by friend/aide) these issues have actually  improved recently, but notes most difficulty as the day goes on with increased fatigue. On exam, pt demonstrates difficulty with dual tasking and challenges during DGI testing. TUG score is indicative of fall risk, as is DGI. In regards to gait impairments as above, pt endorses neurological history (stroke, brain tumor with surgery); unsure if these impairments are attributable to these past conditions as pt states she is no longer following with neurology, although she notes these impairments have been present for at least 10 years (corroborated by friend/aide) and actually seem to be improving. No overt focal weakness, but functional weakness with transfers noted. Pt tolerates exam well, no adverse events. Recommend skilled PT to address aforementioned deficits to improve functional independence/tolerance and reduce fall risk. Pt departs today's session in no acute distress, all voiced questions/concerns addressed appropriately from PT perspective.    OBJECTIVE IMPAIRMENTS: Abnormal gait, decreased activity tolerance, decreased balance, decreased endurance, difficulty walking, improper body mechanics, postural dysfunction, and pain.   ACTIVITY LIMITATIONS:  standing, stairs, transfers, bathing, and locomotion level  PARTICIPATION LIMITATIONS: meal prep, cleaning, laundry, driving, and community activity  PERSONAL FACTORS: Age, Time since onset of injury/illness/exacerbation, and 3+ comorbidities: anxiety/depression, HTN, osteopenia, cancer hx, DM2, hx stroke  are also affecting patient's functional outcome.   REHAB POTENTIAL: Fair given chronicity and comorbidities  CLINICAL DECISION MAKING: Evolving/moderate complexity  EVALUATION COMPLEXITY: Moderate   GOALS: Goals reviewed with patient? No  SHORT TERM GOALS: Target date: 01/08/2023 Pt will demonstrate appropriate understanding and performance of initially prescribed HEP in order to facilitate improved independence with management of symptoms.  Baseline: HEP TBD 01/10/23: reports fair compliance with HEP  Goal status: MET   2. Pt will improve at least MCID on FOTO in order to demonstrate improved perception of function due to symptoms.  Baseline: FOTO TBD  01/10/23: TBD   Goal status: ONGOING  LONG TERM GOALS: Target date: 02/05/2023   Pt will meet predicted score on FOTO in order to demonstrate improved perception of functional status due to symptoms.  Baseline: FOTO TBD Goal status: INITIAL  2. Pt will be able to perform TUG in less than or equal to 15sec sec in order to indicate reduced risk of falling (cutoff score for fall risk 13.5 sec in community dwelling older adults per [facility] et al, 2000)  Baseline: 19sec  Goal status: INITIAL    3.  Pt will perform Dynamic Gait Index with score greater than or equal to 17/24 in order to indicate reduced fall risk (<19/24 predictive of falls in elderly per [facility] et al 1995, MCID 1.9 per Pardasaney et al 2012)  Baseline: 12/24  Goal status: INITIAL    4. Pt will perform 5xSTS in <12 sec and mechanics grossly WNL in order to demonstrate reduced fall risk and improved functional independence. (MCID of 2.3sec)  Baseline: 12  sec w/o UE support, altered mechanics w/ noted retropulsion and mild shift towards LLE   Goal status: INITIAL   PLAN:  PT FREQUENCY: 2x/week  PT DURATION: 8 weeks  PLANNED INTERVENTIONS: Therapeutic exercises, Therapeutic activity, Neuromuscular re-education, Balance training, Gait training, Patient/Family education, Self Care, Stair training, Vestibular training, Cryotherapy, Taping, Manual therapy, and Re-evaluation.  PLAN FOR NEXT SESSION: HEP update. FOTO. Continue working on balance and safety w/ mobility.    Ashley Murrain PT, DPT 01/10/2023 1:05 PM

## 2023-01-10 ENCOUNTER — Encounter: Payer: Self-pay | Admitting: Physical Therapy

## 2023-01-10 ENCOUNTER — Ambulatory Visit: Payer: Medicare HMO | Admitting: Physical Therapy

## 2023-01-10 DIAGNOSIS — R2681 Unsteadiness on feet: Secondary | ICD-10-CM | POA: Diagnosis not present

## 2023-01-10 DIAGNOSIS — M6281 Muscle weakness (generalized): Secondary | ICD-10-CM

## 2023-01-14 NOTE — Therapy (Signed)
OUTPATIENT PHYSICAL THERAPY TREATMENT   Patient Name: MARIALYCE DELHOMME MRN: [id number] DOB:1941/02/14, 82 y.o., female Today's Date: 01/15/2023  END OF SESSION:  PT End of Session - 01/15/23 1151     Visit Number 4    Number of Visits 17    Date for PT Re-Evaluation 02/05/23    Authorization Type [employer]; approved 12 visits    Authorization Time Period 12/11/22-01/26/23    Authorization - Visit Number 3    Authorization - Number of Visits 12    Progress Note Due on Visit 10    PT Start Time 1151   late check in   PT Stop Time 1229    PT Time Calculation (min) 38 min    Activity Tolerance Patient tolerated treatment well    Behavior During Therapy [facility] for tasks assessed/performed               Past Medical History:  Diagnosis Date   Anemia    Anxiety    Arthritis    Bilateral carotid artery disease (HCC) 02/04/2017   Carotid US 3/22: R 1-39; L 100   Depression, recurrent (HCC) 02/10/2019   Hypertension    Hypothyroidism    Impaired functional mobility, balance, gait, and endurance 09/02/2015   Malignant neoplasm of lower-outer quadrant of left breast of female, estrogen receptor positive (HCC) 04/24/2021   Mild cognitive impairment with memory loss 09/02/2015   Osteopenia    Pre-diabetes    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 Clifton James & Dr. Laneta Simmers   Severe aortic stenosis    Stroke [facility])    pt was 20   Past Surgical History:  Procedure Laterality Date   ABDOMINAL HYSTERECTOMY     BACK SURGERY     2019   BRAIN SURGERY     age 27's- steel plate in back of head   BREAST LUMPECTOMY WITH RADIOACTIVE SEED AND SENTINEL LYMPH NODE BIOPSY Left 05/17/2021   Procedure: LEFT BREAST SEED LOCALIZED LUMPECTOMY (BRACKETED) WITH SENTINEL LYMPH NODE BIOPSY;  Surgeon: Harriette Bouillon, MD;  Location: [facility] OR;  Service: General;  Laterality: Left;  PEC BLOCK 90 MINUTES ROOM 9    CATARACT EXTRACTION Bilateral    INTRAOPERATIVE TRANSTHORACIC ECHOCARDIOGRAM N/A 06/26/2022   Procedure: INTRAOPERATIVE TRANSTHORACIC ECHOCARDIOGRAM;  Surgeon: Kathleene Hazel, MD;  Location: [facility] OR;  Service: Open Heart Surgery;  Laterality: N/A;   RE-EXCISION OF BREAST LUMPECTOMY Left 06/08/2021   Procedure: RE-EXCISION OF LEFT BREAST LUMPECTOMY;  Surgeon: Harriette Bouillon, MD;  Location: [facility] ORS;  Service: General;  Laterality: Left;   RIGHT HEART CATH AND CORONARY ANGIOGRAPHY N/A 05/17/2022   Procedure: RIGHT HEART CATH AND CORONARY ANGIOGRAPHY;  Surgeon: Kathleene Hazel, MD;  Location: [facility] INVASIVE CV LAB;  Service: Cardiovascular;  Laterality: N/A;   TOE AMPUTATION  2013   2nd toe on right foot, hammer toe   TONSILLECTOMY     TOOTH EXTRACTION     all teeth removed   TOTAL HIP ARTHROPLASTY Left 03/19/2017   Procedure: LEFT TOTAL HIP ARTHROPLASTY ANTERIOR APPROACH;  Surgeon: Kathryne Hitch, MD;  Location: [facility] OR;  Service: Orthopedics;  Laterality: Left;   TRANSCATHETER AORTIC VALVE REPLACEMENT, TRANSFEMORAL N/A 06/26/2022   Procedure: TRANSCATHETR AORTIC VALVE REPLACEMENT, TRANSFEMORAL;  Surgeon: Kathleene Hazel, MD;  Location: [facility] OR;  Service: Open Heart Surgery;  Laterality: N/A;   ULTRASOUND GUIDANCE FOR VASCULAR ACCESS Bilateral 06/26/2022  Procedure: ULTRASOUND GUIDANCE FOR VASCULAR ACCESS;  Surgeon: Kathleene Hazel, MD;  Location: [facility] OR;  Service: Open Heart Surgery;  Laterality: Bilateral;   Patient Active Problem List   Diagnosis Date Noted   SVT (supraventricular tachycardia) 12/17/2022   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   Aneurysm of thoracic aorta (HCC) 06/04/2022   Pulmonary nodule 06/04/2022   Thyroid nodule 06/04/2022   Malignant neoplasm of lower-outer quadrant of left breast of female, estrogen receptor positive (HCC) 04/24/2021   Controlled type 2 diabetes  mellitus with chronic kidney disease, without long-term current use of insulin (HCC) 11/27/2019   Memory impairment 08/31/2019   Depression, recurrent (HCC) 02/10/2019   Degenerative spondylolisthesis 06/06/2018   Sensorineural hearing loss (SNHL), 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   Bilateral carotid artery disease (HCC) 02/04/2017   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   Osteopenia 08/30/2014   Severe aortic stenosis 12/12/2009   Hypothyroidism 07/12/2009   INSOMNIA, PERSISTENT 07/12/2009   Essential hypertension 07/12/2009    PCP: No PCP in chart  REFERRING PROVIDER: Edwin Cap, DPM  REFERRING DIAG: G62.89 (ICD-10-CM) - Other polyneuropathy R26.89 (ICD-10-CM) - Balance disorder  Rationale for Evaluation and Treatment: Rehabilitation  THERAPY DIAG:  Unsteadiness on feet  Muscle weakness (generalized)  ONSET DATE: several years  SUBJECTIVE:                                                                                                                                                                                           SUBJECTIVE STATEMENT: 01/15/2023 accompanied by Mardelle Matte. A little bit of soreness after last session that cleared up after a few minutes.    EVAL: Balance is worse in evenings, occasional steadying assist from aide and SPC use for community distances. More difficulty with uneven surfaces (yard). Difficulty with stairs, needs bannister support States she occasionally gets dizzy, also at night, states she has spoken with provider about it and has improved with medication changes. Does endorse tingling in her feet and hands which she attributes to neuropathy and has been ongoing for years. States balance actually seems to be improving recently, has had issues for several years.  Today's observed gait mechanics have been present for at least 10 years per her aide, also describes some previous freezing behavior (several years ago) but this has reportedly resolved  PERTINENT HISTORY:   anxiety/depression, anemia, HTN, hx cancer, osteopenia, s/p TAVR Jan 2024, hx stroke, carotid artery disease, DM2 w/ CKD, HOH, hx brain tumor with surgery   PAIN:  No pain   PRECAUTIONS: fall risk, hx cancer, cardiovascular hx, neurological hx, HOH  WEIGHT BEARING  RESTRICTIONS: No  FALLS:  Has patient fallen in last 6 months? 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  PLOF: assistance w/ ADLs/housework  PATIENT GOALS: improve balance  NEXT MD VISIT: sees PCP every 3 months  OBJECTIVE: (objective measures completed at initial evaluation unless otherwise dated)   DIAGNOSTIC FINDINGS:  No recent imaging pertaining to this episode   PATIENT SURVEYS:  FOTO deferred on eval given time constraints 01/04/23: Deferred- system down 01/15/23: 50 current, 50 predicted  COGNITION: Overall cognitive status: Within functional limits for tasks assessed - confounded by hearing impairments, she does endorse some memory issues, aide/friend corroborates/assists with history at times   SENSATION: Light touch intact B UE and LE  Mild incoordination B UE, R seems more affected than L (pt attributes this to prior stroke) Does have lateral deviation of R eye, states this has been present since at least adolescence   POSTURE: mild lean to L, increased kyphosis, increased postural sway in standing (tendency towards posterior)  PALPATION: Not indicated   RANGE OF MOTION:     Active  Right eval Left eval  Shoulder flexion Reduced with compensatory trunk lean to L [facility]  Functional ER combo    Functional IR combo    Knee extension    Ankle dorsiflexion     (Blank rows = not tested) (Key: WFL = within functional limits not formally assessed, * = concordant  pain, s = stiffness/stretching sensation, NT = not tested)  Comments:    STRENGTH TESTING:  MMT Right eval Left eval  Shoulder flexion    Shoulder abduction    Elbow flexion    Elbow extension    Grip strength (gross)    Hip flexion    Hip abduction (modified sitting) 5 5  Knee flexion 4+ 4+  Knee extension 4+ 4+  Ankle dorsiflexion    Ankle plantarflexion     (Blank rows = not tested) (Key: WFL = within functional limits not formally assessed, * = concordant pain, s = stiffness/stretching sensation, NT = not tested)  Comments:     FUNCTIONAL TESTS:  5 times sit to stand: 12.83 sec, no UE support but noted retropulsion, difficulty with forward trunk lean  TUG: 19 sec, no AD, UE support from chair  01/04/23: SLS 5 sec each; Tandem stance 10 sec each  01/15/23: TUG 20sec w/ SPC UE support from chair    GAIT: Distance walked: within clinic Assistive device utilized: None Level of assistance: Complete Independence Comments: wide BOS, shuffling steps, tendency for reduced trunk/rotation and arm swing    Dynamic Gait Index:   1. Gait Level Surface; 2  2. Change in Gait Speed; 1  3. Gait With Horizontal Head Turns;2  4. Gait With Vertical Head Turns; 2  5. Gait and Pivot Turn; 1  6. Step Over Obstacle; 1  7. Step Around Obstacles; 2  8. Steps; 1   Grading scale: 3 No impairment; 2 Mild impairment/AD use; 1 Moderate impairment; 0 Severe impairment or unable to perform   Total score: 12/24  Cutoff score for fall risk: </=19/24   TODAY'S TREATMENT:  [facility] Adult PT Treatment:                                                DATE: 01/15/23 Therapeutic Exercise: STS x10 from mat BW cues for trunk lean  STS + upper body reach unilat 2x10 for improved trunk lean  Gait Training (SPC, CGA and 3ft runway):  SPC sizing/adjustment, education on appropriate  use Turn practice w/ cones of varying darkness/brightness - equal errors, 3 laps, cues for widened radius  Turn practice - figure 8 for increased complexity, cues for radius and step length Turn practice - using 2 cones for visual cueing to maintain appropriate radius, 4 laps, use of visual cues/guidance Rest breaks between with education on sequencing, step mechanics, and safety Therapeutic Activity: TUG + education FOTO + education   [facility] Adult PT Treatment:                                                DATE: 01/10/23 Therapeutic Exercise: STS 2x10 cues for trunk mechanics, pacing, and weight shifting (noted tendency for posterior LOB) Standing heel raise at counter 2x10  Standing hip abduction x10 BIL  Gait Training 6 cones, cone weaving x2 laps with close CGA intermittent LOB towards  6 cones weaving 2 laps with seated rest in between for velocity, 37 sec first trial 2 errors close CGA, 28sec second set with 1 error Turn practice (2 cones) 3 laps, cues for velocity, foot strike, pacing, increased radius  Education on safety w/ gait, compensatory strategies to mitigate fall risk (limiting backwards walking, increasing turn radius and reducing velocity) with teach back method (limited recall)    [facility] Adult PT Treatment:                                                DATE: 01/04/23 Therapeutic Exercise: STS with moderate cues for weight shift/ reach forward for correct mechanics - 10 x 2  Standing heel raises 10 x 2  Standing toe raises 10 x 2 Standing hip abduction x 10 each Standing march x 10 each  Bridge x 10  Tandem stance 10 sec each SLS 5 sec each  Therapeutic Activity: Forward gait along counter top 4 passes with cues for step length/ heel strike Side stepping at counter top 4 passes with UE Gait with CGA and SPC placed in left UE- min cues required, improved step length     [facility] Adult PT Treatment:                                                DATE:  12/11/22 Deferred given time constraints  PATIENT EDUCATION:  Education details: rationale for interventions Person educated: Patient and friend/aide Education method: Explanation, Demonstration, Tactile cues, Verbal cues Education comprehension: verbalized understanding, returned demonstration, verbal cues required, tactile cues required, and needs further education    HOME EXERCISE PROGRAM: Administered 01/04/23  ASSESSMENT:  CLINICAL IMPRESSION: 01/15/2023 Pt arrives and states  she is feeling pretty good today. Continuing to work on blocked practice for [employer] training with emphasis on trunk lean for improved weight shift to maximize LE involvement, requires rest breaks for fatigue and education. Also working on Investment banker, operational with SPC, some tasks appear limited by [facility] as pt does much better with visual cues/demonstrations but does have limited carryover with attempts to wean feedback. No LOB today but does demo mild generalized instability, CGA for gait training throughout. No adverse events, reports feeling good on departure. Recommend continuing along current POC in order to address relevant deficits and improve functional tolerance. Pt departs today's session in no acute distress, all voiced questions/concerns addressed appropriately from PT perspective.     EVAL: Pt arrives on this date for balance issues that have been ongoing for several years - per her report (corroborated by friend/aide) these issues have actually improved recently, but notes most difficulty as the day goes on with increased fatigue. On exam, pt demonstrates difficulty with dual tasking and challenges during DGI testing. TUG score is indicative of fall risk, as is DGI. In regards to gait impairments as above, pt endorses neurological history (stroke, brain tumor with surgery); unsure if these impairments are attributable to these past conditions as pt states she is no longer following with neurology, although she notes these  impairments have been present for at least 10 years (corroborated by friend/aide) and actually seem to be improving. No overt focal weakness, but functional weakness with transfers noted. Pt tolerates exam well, no adverse events. Recommend skilled PT to address aforementioned deficits to improve functional independence/tolerance and reduce fall risk. Pt departs today's session in no acute distress, all voiced questions/concerns addressed appropriately from PT perspective.    OBJECTIVE IMPAIRMENTS: Abnormal gait, decreased activity tolerance, decreased balance, decreased endurance, difficulty walking, improper body mechanics, postural dysfunction, and pain.   ACTIVITY LIMITATIONS: standing, stairs, transfers, bathing, and locomotion level  PARTICIPATION LIMITATIONS: meal prep, cleaning, laundry, driving, and community activity  PERSONAL FACTORS: Age, Time since onset of injury/illness/exacerbation, and 3+ comorbidities: anxiety/depression, HTN, osteopenia, cancer hx, DM2, hx stroke  are also affecting patient's functional outcome.   REHAB POTENTIAL: Fair given chronicity and comorbidities  CLINICAL DECISION MAKING: Evolving/moderate complexity  EVALUATION COMPLEXITY: Moderate   GOALS: Goals reviewed with patient? No  SHORT TERM GOALS: Target date: 01/08/2023 Pt will demonstrate appropriate understanding and performance of initially prescribed HEP in order to facilitate improved independence with management of symptoms.  Baseline: HEP TBD 01/10/23: reports fair compliance with HEP  Goal status: MET   2. Pt will improve at least MCID on FOTO in order to demonstrate improved perception of function due to symptoms.  Baseline: FOTO TBD  01/15/23: 50  Goal status: ONGOING  LONG TERM GOALS: Target date: 02/05/2023   Pt will meet predicted score on FOTO in order to demonstrate improved perception of functional status due to symptoms.  Baseline: FOTO TBD Goal status: INITIAL  2. Pt will be  able to perform TUG in less than or equal to 15sec sec in order to indicate reduced risk of falling (cutoff score for fall risk 13.5 sec in community dwelling older adults per [facility] et al, 2000)  Baseline: 19sec  Goal status: INITIAL    3.  Pt will perform Dynamic Gait Index with score greater than or equal to 17/24 in order to indicate reduced fall risk (<19/24 predictive of falls in elderly per [employer] et al 1995, MCID 1.9 per [place] et al 2012)  Baseline: 12/24  Goal status: INITIAL    4. Pt will perform 5xSTS in <12 sec and mechanics grossly WNL in order to demonstrate reduced fall risk and improved functional independence. (MCID of 2.3sec)  Baseline: 12 sec w/o UE support, altered mechanics w/ noted retropulsion and mild shift towards LLE   Goal status: INITIAL   PLAN:  PT FREQUENCY: 2x/week  PT DURATION: 8 weeks  PLANNED INTERVENTIONS: Therapeutic exercises, Therapeutic activity, Neuromuscular re-education, Balance training, Gait training, Patient/Family education, Self Care, Stair training, Vestibular training, Cryotherapy, Taping, Manual therapy, and Re-evaluation.  PLAN FOR NEXT SESSION: HEP update.  Continue working on balance and safety w/ mobility.    Ashley Murrain PT, DPT 01/15/2023 12:41 PM

## 2023-01-15 ENCOUNTER — Ambulatory Visit: Payer: Medicare HMO | Admitting: Physical Therapy

## 2023-01-15 ENCOUNTER — Encounter: Payer: Self-pay | Admitting: Physical Therapy

## 2023-01-15 DIAGNOSIS — R2681 Unsteadiness on feet: Secondary | ICD-10-CM

## 2023-01-15 DIAGNOSIS — M6281 Muscle weakness (generalized): Secondary | ICD-10-CM

## 2023-01-22 ENCOUNTER — Encounter: Payer: Self-pay | Admitting: Physical Therapy

## 2023-01-22 ENCOUNTER — Ambulatory Visit: Payer: Medicare HMO | Attending: Podiatry | Admitting: Physical Therapy

## 2023-01-22 DIAGNOSIS — M6281 Muscle weakness (generalized): Secondary | ICD-10-CM | POA: Insufficient documentation

## 2023-01-22 DIAGNOSIS — R2681 Unsteadiness on feet: Secondary | ICD-10-CM | POA: Diagnosis present

## 2023-01-22 NOTE — Therapy (Addendum)
OUTPATIENT PHYSICAL THERAPY TREATMENT   Patient Name: Yvonne [name] MRN: [id number] DOB:04/12/1941, 82 y.o., female Today's Date: 01/22/2023  END OF SESSION:  PT End of Session - 01/22/23 1111     Visit Number 5    Number of Visits 17    Date for PT Re-Evaluation 02/05/23    Authorization Type [employer]; approved 12 visits    Authorization Time Period 12/11/22-01/26/23    Authorization - Visit Number 4    Authorization - Number of Visits 12    Progress Note Due on Visit 10    PT Start Time 1112   pt arrived late   PT Stop Time 1157    PT Time Calculation (min) 45 min    Activity Tolerance Patient tolerated treatment well    Behavior During Therapy [facility] for tasks assessed/performed                Past Medical History:  Diagnosis Date   Anemia    Anxiety    Arthritis    Bilateral carotid artery disease (HCC) 02/04/2017   Carotid US 3/22: R 1-39; L 100   Depression, recurrent (HCC) 02/10/2019   Hypertension    Hypothyroidism    Impaired functional mobility, balance, gait, and endurance 09/02/2015   Malignant neoplasm of lower-outer quadrant of left breast of female, estrogen receptor positive (HCC) 04/24/2021   Mild cognitive impairment with memory loss 09/02/2015   Osteopenia    Pre-diabetes    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 Clifton James & Dr. Laneta Simmers   Severe aortic stenosis    Stroke [facility])    pt was 54   Past Surgical History:  Procedure Laterality Date   ABDOMINAL HYSTERECTOMY     BACK SURGERY     2019   BRAIN SURGERY     age 17's- steel plate in back of head   BREAST LUMPECTOMY WITH RADIOACTIVE SEED AND SENTINEL LYMPH NODE BIOPSY Left 05/17/2021   Procedure: LEFT BREAST SEED LOCALIZED LUMPECTOMY (BRACKETED) WITH SENTINEL LYMPH NODE BIOPSY;  Surgeon: Harriette Bouillon, MD;  Location: [facility] OR;  Service: General;  Laterality: Left;  PEC BLOCK 90 MINUTES ROOM 9    CATARACT EXTRACTION Bilateral    INTRAOPERATIVE TRANSTHORACIC ECHOCARDIOGRAM N/A 06/26/2022   Procedure: INTRAOPERATIVE TRANSTHORACIC ECHOCARDIOGRAM;  Surgeon: Kathleene Hazel, MD;  Location: [facility] OR;  Service: Open Heart Surgery;  Laterality: N/A;   RE-EXCISION OF BREAST LUMPECTOMY Left 06/08/2021   Procedure: RE-EXCISION OF LEFT BREAST LUMPECTOMY;  Surgeon: Harriette Bouillon, MD;  Location: [facility] ORS;  Service: General;  Laterality: Left;   RIGHT HEART CATH AND CORONARY ANGIOGRAPHY N/A 05/17/2022   Procedure: RIGHT HEART CATH AND CORONARY ANGIOGRAPHY;  Surgeon: Kathleene Hazel, MD;  Location: [facility] INVASIVE CV LAB;  Service: Cardiovascular;  Laterality: N/A;   TOE AMPUTATION  2013   2nd toe on right foot, hammer toe   TONSILLECTOMY     TOOTH EXTRACTION     all teeth removed   TOTAL HIP ARTHROPLASTY Left 03/19/2017   Procedure: LEFT TOTAL HIP ARTHROPLASTY ANTERIOR APPROACH;  Surgeon: Kathryne Hitch, MD;  Location: [facility] OR;  Service: Orthopedics;  Laterality: Left;   TRANSCATHETER AORTIC VALVE REPLACEMENT, TRANSFEMORAL N/A 06/26/2022   Procedure: TRANSCATHETR AORTIC VALVE REPLACEMENT, TRANSFEMORAL;  Surgeon: Kathleene Hazel, MD;  Location: [facility] OR;  Service: Open Heart Surgery;  Laterality: N/A;   ULTRASOUND GUIDANCE FOR VASCULAR ACCESS Bilateral 06/26/2022  Procedure: ULTRASOUND GUIDANCE FOR VASCULAR ACCESS;  Surgeon: Kathleene Hazel, MD;  Location: [facility] OR;  Service: Open Heart Surgery;  Laterality: Bilateral;   Patient Active Problem List   Diagnosis Date Noted   SVT (supraventricular tachycardia) 12/17/2022   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   Aneurysm of thoracic aorta (HCC) 06/04/2022   Pulmonary nodule 06/04/2022   Thyroid nodule 06/04/2022   Malignant neoplasm of lower-outer quadrant of left breast of female, estrogen receptor positive (HCC) 04/24/2021   Controlled type 2 diabetes  mellitus with chronic kidney disease, without long-term current use of insulin (HCC) 11/27/2019   Memory impairment 08/31/2019   Depression, recurrent (HCC) 02/10/2019   Degenerative spondylolisthesis 06/06/2018   Sensorineural hearing loss (SNHL), 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   Bilateral carotid artery disease (HCC) 02/04/2017   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   Osteopenia 08/30/2014   Severe aortic stenosis 12/12/2009   Hypothyroidism 07/12/2009   INSOMNIA, PERSISTENT 07/12/2009   Essential hypertension 07/12/2009    PCP: No PCP in chart  REFERRING PROVIDER: Edwin Cap, DPM  REFERRING DIAG: G62.89 (ICD-10-CM) - Other polyneuropathy R26.89 (ICD-10-CM) - Balance disorder  Rationale for Evaluation and Treatment: Rehabilitation  THERAPY DIAG:  Unsteadiness on feet  Muscle weakness (generalized)  ONSET DATE: several years  SUBJECTIVE:                                                                                                                                                                                           SUBJECTIVE STATEMENT: 01/22/2023 "I did fall once since I was last seen, I feel from the porch and they didn't have any support."   EVAL: Balance is worse in evenings, occasional steadying assist from aide and SPC use for community distances. More difficulty with uneven surfaces (yard). Difficulty with stairs, needs bannister support States she occasionally gets dizzy, also at night, states she has spoken with provider about it and has improved with medication changes. Does endorse tingling in her feet and hands which she attributes to neuropathy and has been ongoing for years. States balance actually seems to be improving recently, has had issues for several years.  Today's observed gait mechanics have been present for at least 10 years per her aide, also describes some previous freezing behavior (several years ago) but this has reportedly resolved  PERTINENT HISTORY:   anxiety/depression, anemia, HTN, hx cancer, osteopenia, s/p TAVR Jan 2024, hx stroke, carotid artery disease, DM2 w/ CKD, HOH, hx brain tumor with surgery   PAIN:  No pain   PRECAUTIONS: fall risk, hx cancer, cardiovascular hx, neurological hx, HOH  WEIGHT  BEARING RESTRICTIONS: No  FALLS:  Has patient fallen in last 6 months? 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  PLOF: assistance w/ ADLs/housework  PATIENT GOALS: improve balance  NEXT MD VISIT: sees PCP every 3 months  OBJECTIVE: (objective measures completed at initial evaluation unless otherwise dated)   DIAGNOSTIC FINDINGS:  No recent imaging pertaining to this episode   PATIENT SURVEYS:  FOTO deferred on eval given time constraints 01/04/23: Deferred- system down 01/15/23: 50 current, 50 predicted  COGNITION: Overall cognitive status: Within functional limits for tasks assessed - confounded by hearing impairments, she does endorse some memory issues, aide/friend corroborates/assists with history at times   SENSATION: Light touch intact B UE and LE  Mild incoordination B UE, R seems more affected than L (pt attributes this to prior stroke) Does have lateral deviation of R eye, states this has been present since at least adolescence   POSTURE: mild lean to L, increased kyphosis, increased postural sway in standing (tendency towards posterior)  PALPATION: Not indicated   RANGE OF MOTION:     Active  Right eval Left eval  Shoulder flexion Reduced with compensatory trunk lean to L [facility]  Functional ER combo    Functional IR combo    Knee extension    Ankle dorsiflexion     (Blank rows = not tested) (Key: WFL = within functional limits not formally assessed, * = concordant  pain, s = stiffness/stretching sensation, NT = not tested)  Comments:    STRENGTH TESTING:  MMT Right eval Left eval  Shoulder flexion    Shoulder abduction    Elbow flexion    Elbow extension    Grip strength (gross)    Hip flexion    Hip abduction (modified sitting) 5 5  Knee flexion 4+ 4+  Knee extension 4+ 4+  Ankle dorsiflexion    Ankle plantarflexion     (Blank rows = not tested) (Key: WFL = within functional limits not formally assessed, * = concordant pain, s = stiffness/stretching sensation, NT = not tested)  Comments:     FUNCTIONAL TESTS:  5 times sit to stand: 12.83 sec, no UE support but noted retropulsion, difficulty with forward trunk lean  TUG: 19 sec, no AD, UE support from chair  01/04/23: SLS 5 sec each; Tandem stance 10 sec each  01/15/23: TUG 20sec w/ SPC UE support from chair    GAIT: Distance walked: within clinic Assistive device utilized: None Level of assistance: Complete Independence Comments: wide BOS, shuffling steps, tendency for reduced trunk/rotation and arm swing    Dynamic Gait Index:   1. Gait Level Surface; 2  2. Change in Gait Speed; 1  3. Gait With Horizontal Head Turns;2  4. Gait With Vertical Head Turns; 2  5. Gait and Pivot Turn; 1  6. Step Over Obstacle; 1  7. Step Around Obstacles; 2  8. Steps; 1   Grading scale: 3 No impairment; 2 Mild impairment/AD use; 1 Moderate impairment; 0 Severe impairment or unable to perform   Total score: 12/24  Cutoff score for fall risk: </=19/24   TODAY'S TREATMENT:  [facility] Adult PT Treatment:                                                DATE: 01/22/2023 Therapeutic Exercise: Sit to stand from chair for lowered surface with max cues for proper form 3 x 8 Seated marching while seated on dyna-disc 2 x 10 LAQ 1 x 10  Provided handout for HEP  Neuromuscular  re-ed: Standing rhomberg position 1 x 30 sec EO with min,  Standing rhomberg position 1 x 30 sec EC with max sway and posterior lean noted Standing ankle strategy rocking on floor 2 x 10 Static stance on airex pad 2 x 30 sec with EO with min postural sway Therapeutic Activity: Steps on sit to stand scooting to EOM, bending knees, rocking forward requiring max cues both verbal, tactile and demonstration    [facility] Adult PT Treatment:                                                DATE: 01/15/23 Therapeutic Exercise: STS x10 from mat BW cues for trunk lean  STS + upper body reach unilat 2x10 for improved trunk lean  Gait Training (SPC, CGA and 56ft runway):  SPC sizing/adjustment, education on appropriate use Turn practice w/ cones of varying darkness/brightness - equal errors, 3 laps, cues for widened radius  Turn practice - figure 8 for increased complexity, cues for radius and step length Turn practice - using 2 cones for visual cueing to maintain appropriate radius, 4 laps, use of visual cues/guidance Rest breaks between with education on sequencing, step mechanics, and safety Therapeutic Activity: TUG + education FOTO + education   [facility] Adult PT Treatment:                                                DATE: 01/10/23 Therapeutic Exercise: STS 2x10 cues for trunk mechanics, pacing, and weight shifting (noted tendency for posterior LOB) Standing heel raise at counter 2x10  Standing hip abduction x10 BIL  Gait Training 6 cones, cone weaving x2 laps with close CGA intermittent LOB towards  6 cones weaving 2 laps with seated rest in between for velocity, 37 sec first trial 2 errors close CGA, 28sec second set with 1 error Turn practice (2 cones) 3 laps, cues for velocity, foot strike, pacing, increased radius  Education on safety w/ gait, compensatory strategies to mitigate fall risk (limiting backwards walking, increasing turn radius and reducing velocity) with teach back method  (limited recall)  PATIENT EDUCATION:  Education details: rationale for interventions Person educated: Patient and friend/aide Education method: Explanation, Demonstration, Tactile cues, Verbal cues Education comprehension: verbalized understanding, returned demonstration, verbal cues required, tactile cues required, and needs further education    HOME EXERCISE PROGRAM: Access Code: [email or web address] URL: [email or web address] Date: 01/22/2023 Prepared by: Lulu Riding  Exercises - Seated March  - 1 x daily - 7 x weekly - 2 sets - 10 reps - Seated Gluteal Sets  - 1 x daily - 7 x weekly - 2 sets - 10 reps - Seated Long Arc Quad  - 1 x  daily - 7 x weekly - 3 sets - 10 reps - 3 seconds hold - Seated Transversus Abdominis Bracing  - 2-3 x daily - 7 x weekly - 2 sets - 10 reps - 10 seconds hold - Sit to Stand Without Arm Support  - 1 x daily - 7 x weekly - 2 sets - 10 reps  ASSESSMENT:  CLINICAL IMPRESSION: 01/22/2023 Pt arrives to session reporting no pain. Time was taken to work on steps for getting out of a chair. Reviewed with her care taker  on avioding use of wedging knees on chair to prevent potential risk of falling, and to assist with steps of getting sit to stand biomechanics which she required max cues for both verbal and tactile. Worked on core activation / stability unstable surface with LE movement. As well as static standing balance on stable/ and unstable surface. When pt performed standing balance increased posterior trunk lean was observed. Worked on ankle strategy activation which she did well with. Provided handout for HEP today.    EVAL: Pt arrives on this date for balance issues that have been ongoing for several years - per her report (corroborated by friend/aide) these issues have actually improved recently, but notes most difficulty as the day goes on with increased fatigue. On exam, pt demonstrates difficulty with dual tasking and challenges during DGI testing.  TUG score is indicative of fall risk, as is DGI. In regards to gait impairments as above, pt endorses neurological history (stroke, brain tumor with surgery); unsure if these impairments are attributable to these past conditions as pt states she is no longer following with neurology, although she notes these impairments have been present for at least 10 years (corroborated by friend/aide) and actually seem to be improving. No overt focal weakness, but functional weakness with transfers noted. Pt tolerates exam well, no adverse events. Recommend skilled PT to address aforementioned deficits to improve functional independence/tolerance and reduce fall risk. Pt departs today's session in no acute distress, all voiced questions/concerns addressed appropriately from PT perspective.    OBJECTIVE IMPAIRMENTS: Abnormal gait, decreased activity tolerance, decreased balance, decreased endurance, difficulty walking, improper body mechanics, postural dysfunction, and pain.   ACTIVITY LIMITATIONS: standing, stairs, transfers, bathing, and locomotion level  PARTICIPATION LIMITATIONS: meal prep, cleaning, laundry, driving, and community activity  PERSONAL FACTORS: Age, Time since onset of injury/illness/exacerbation, and 3+ comorbidities: anxiety/depression, HTN, osteopenia, cancer hx, DM2, hx stroke  are also affecting patient's functional outcome.   REHAB POTENTIAL: Fair given chronicity and comorbidities  CLINICAL DECISION MAKING: Evolving/moderate complexity  EVALUATION COMPLEXITY: Moderate   GOALS: Goals reviewed with patient? No  SHORT TERM GOALS: Target date: 01/08/2023 Pt will demonstrate appropriate understanding and performance of initially prescribed HEP in order to facilitate improved independence with management of symptoms.  Baseline: HEP TBD 01/10/23: reports fair compliance with HEP  Goal status: MET   2. Pt will improve at least MCID on FOTO in order to demonstrate improved perception of  function due to symptoms.  Baseline: FOTO TBD  01/15/23: 50  Goal status: ONGOING  LONG TERM GOALS: Target date: 02/05/2023   Pt will meet predicted score on FOTO in order to demonstrate improved perception of functional status due to symptoms.  Baseline: FOTO TBD Goal status: INITIAL  2. Pt will be able to perform TUG in less than or equal to 15sec sec in order to indicate reduced risk of falling (cutoff score for fall risk 13.5 sec in community dwelling older adults per [facility] et  al, 2000)  Baseline: 19sec  Goal status: INITIAL    3.  Pt will perform Dynamic Gait Index with score greater than or equal to 17/24 in order to indicate reduced fall risk (<19/24 predictive of falls in elderly per [facility] et al 1995, MCID 1.9 per P[place]as[place] et al 2012)  Baseline: 12/24  Goal status: INITIAL    4. Pt will perform 5xSTS in <12 sec and mechanics grossly WNL in order to demonstrate reduced fall risk and improved functional independence. (MCID of 2.3sec)  Baseline: 12 sec w/o UE support, altered mechanics w/ noted retropulsion and mild shift towards LLE   Goal status: INITIAL   PLAN:  PT FREQUENCY: 2x/week  PT DURATION: 8 weeks  PLANNED INTERVENTIONS: Therapeutic exercises, Therapeutic activity, Neuromuscular re-education, Balance training, Gait training, Patient/Family education, Self Care, Stair training, Vestibular training, Cryotherapy, Taping, Manual therapy, and Re-evaluation.  PLAN FOR NEXT SESSION: HEP update.  Continue working on balance and safety w/ mobility.      PT, DPT, LAT, ATC  01/28/23  10:02 AM

## 2023-01-28 ENCOUNTER — Encounter: Payer: Self-pay | Admitting: Physical Therapy

## 2023-01-28 ENCOUNTER — Ambulatory Visit: Payer: Medicare HMO | Admitting: Physical Therapy

## 2023-01-28 DIAGNOSIS — R2681 Unsteadiness on feet: Secondary | ICD-10-CM

## 2023-01-28 DIAGNOSIS — M6281 Muscle weakness (generalized): Secondary | ICD-10-CM

## 2023-01-28 NOTE — Therapy (Signed)
OUTPATIENT PHYSICAL THERAPY TREATMENT   Patient Name: Yvonne [name] MRN: [id number] DOB:[birth year], 82 y.o., female Today's Date: 01/28/2023  END OF SESSION:  PT End of Session - 01/28/23 1107     Visit Number 6    Number of Visits 17    Date for PT Re-Evaluation 02/05/23    Authorization Type [employer]; approved 12 visits    Authorization Time Period 12/11/22-01/26/23    Authorization - Visit Number 5    Authorization - Number of Visits 12    Progress Note Due on Visit 10    PT Start Time 1105                 Past Medical History:  Diagnosis Date   Anemia    Anxiety    Arthritis    Bilateral carotid artery disease (HCC) 02/04/2017   Carotid US 3/22: R 1-39; L 100   Depression, recurrent (HCC) 02/10/2019   Hypertension    Hypothyroidism    Impaired functional mobility, balance, gait, and endurance 09/02/2015   Malignant neoplasm of lower-outer quadrant of left breast of female, estrogen receptor positive (HCC) 04/24/2021   Mild cognitive impairment with memory loss 09/02/2015   Osteopenia    Pre-diabetes    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 Clifton James & Dr. Laneta Simmers   Severe aortic stenosis    Stroke [facility])    pt was 69   Past Surgical History:  Procedure Laterality Date   ABDOMINAL HYSTERECTOMY     BACK SURGERY     2019   BRAIN SURGERY     age 33's- steel plate in back of head   BREAST LUMPECTOMY WITH RADIOACTIVE SEED AND SENTINEL LYMPH NODE BIOPSY Left 05/17/2021   Procedure: LEFT BREAST SEED LOCALIZED LUMPECTOMY (BRACKETED) WITH SENTINEL LYMPH NODE BIOPSY;  Surgeon: Harriette Bouillon, MD;  Location: [facility] OR;  Service: General;  Laterality: Left;  PEC BLOCK 90 MINUTES ROOM 9   CATARACT EXTRACTION Bilateral    INTRAOPERATIVE TRANSTHORACIC ECHOCARDIOGRAM N/A 06/26/2022   Procedure: INTRAOPERATIVE TRANSTHORACIC ECHOCARDIOGRAM;  Surgeon: Kathleene Hazel, MD;   Location: [facility] OR;  Service: Open Heart Surgery;  Laterality: N/A;   RE-EXCISION OF BREAST LUMPECTOMY Left 06/08/2021   Procedure: RE-EXCISION OF LEFT BREAST LUMPECTOMY;  Surgeon: Harriette Bouillon, MD;  Location: [facility] ORS;  Service: General;  Laterality: Left;   RIGHT HEART CATH AND CORONARY ANGIOGRAPHY N/A 05/17/2022   Procedure: RIGHT HEART CATH AND CORONARY ANGIOGRAPHY;  Surgeon: Kathleene Hazel, MD;  Location: [facility] INVASIVE CV LAB;  Service: Cardiovascular;  Laterality: N/A;   TOE AMPUTATION  2013   2nd toe on right foot, hammer toe   TONSILLECTOMY     TOOTH EXTRACTION     all teeth removed   TOTAL HIP ARTHROPLASTY Left 03/19/2017   Procedure: LEFT TOTAL HIP ARTHROPLASTY ANTERIOR APPROACH;  Surgeon: Kathryne Hitch, MD;  Location: [facility] OR;  Service: Orthopedics;  Laterality: Left;   TRANSCATHETER AORTIC VALVE REPLACEMENT, TRANSFEMORAL N/A 06/26/2022   Procedure: TRANSCATHETR AORTIC VALVE REPLACEMENT, TRANSFEMORAL;  Surgeon: Kathleene Hazel, MD;  Location: [facility] OR;  Service: Open Heart Surgery;  Laterality: N/A;   ULTRASOUND GUIDANCE FOR VASCULAR ACCESS Bilateral 06/26/2022   Procedure: ULTRASOUND GUIDANCE FOR VASCULAR ACCESS;  Surgeon: Kathleene Hazel, MD;  Location: [facility] OR;  Service: Open Heart Surgery;  Laterality: Bilateral;   Patient Active Problem List   Diagnosis Date Noted  SVT (supraventricular tachycardia) 12/17/2022   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   Aneurysm of thoracic aorta (HCC) 06/04/2022   Pulmonary nodule 06/04/2022   Thyroid nodule 06/04/2022   Malignant neoplasm of lower-outer quadrant of left breast of female, estrogen receptor positive (HCC) 04/24/2021   Controlled type 2 diabetes mellitus with chronic kidney disease, without long-term current use of insulin (HCC) 11/27/2019   Memory impairment 08/31/2019   Depression, recurrent (HCC) 02/10/2019   Degenerative  spondylolisthesis 06/06/2018   Sensorineural hearing loss (SNHL), 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   Bilateral carotid artery disease (HCC) 02/04/2017   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   Osteopenia 08/30/2014   Severe aortic stenosis 12/12/2009   Hypothyroidism 07/12/2009   INSOMNIA, PERSISTENT 07/12/2009   Essential hypertension 07/12/2009    PCP: No PCP in chart  REFERRING PROVIDER: Edwin Cap, DPM  REFERRING DIAG: G62.89 (ICD-10-CM) - Other polyneuropathy R26.89 (ICD-10-CM) - Balance disorder  Rationale for Evaluation and Treatment: Rehabilitation  THERAPY DIAG:  Unsteadiness on feet  Muscle weakness (generalized)  ONSET DATE: several years  SUBJECTIVE:                                                                                                                                                                                           SUBJECTIVE STATEMENT: 01/28/2023"I've had been ok no falls at home."   EVAL: Balance is worse in evenings, occasional steadying assist from aide and SPC use for community distances. More difficulty with uneven surfaces (yard). Difficulty with stairs, needs bannister support States she occasionally gets dizzy, also at night, states she has spoken with provider about it and has improved with medication changes. Does endorse tingling in her feet and hands which she attributes to neuropathy and has been ongoing for years. States balance actually seems to be improving recently, has had issues for several years.  Today's observed gait mechanics have been present for at least 10 years per her aide, also describes some previous freezing behavior (several years ago) but this has reportedly resolved  PERTINENT HISTORY:  anxiety/depression, anemia, HTN, hx cancer, osteopenia, s/p TAVR Jan 2024, hx stroke, carotid artery disease, DM2 w/ CKD, HOH, hx brain tumor with surgery   PAIN:  No pain   PRECAUTIONS: fall risk, hx cancer, cardiovascular hx, neurological hx,  HOH  WEIGHT BEARING RESTRICTIONS: No  FALLS:  Has patient fallen in last 6 months? No (had some falls last year)   LIVING ENVIRONMENT: 1 story home, 4STE no rails Lives alone, has an aide Mon-Fri 1130-1330 Has help with housework, medication (pillbox), meal prep  Has an SPC (most used),  QC, RW,  No grab bars    OCCUPATION: retired Chartered loss adjuster  PLOF: assistance w/ ADLs/housework  PATIENT GOALS: improve balance  NEXT MD VISIT: sees PCP every 3 months  OBJECTIVE: (objective measures completed at initial evaluation unless otherwise dated)   DIAGNOSTIC FINDINGS:  No recent imaging pertaining to this episode   PATIENT SURVEYS:  FOTO deferred on eval given time constraints 01/04/23: Deferred- system down 01/15/23: 50 current, 50 predicted  COGNITION: Overall cognitive status: Within functional limits for tasks assessed - confounded by hearing impairments, she does endorse some memory issues, aide/friend corroborates/assists with history at times   SENSATION: Light touch intact B UE and LE  Mild incoordination B UE, R seems more affected than L (pt attributes this to prior stroke) Does have lateral deviation of R eye, states this has been present since at least adolescence   POSTURE: mild lean to L, increased kyphosis, increased postural sway in standing (tendency towards posterior)  PALPATION: Not indicated   RANGE OF MOTION:     Active  Right eval Left eval  Shoulder flexion Reduced with compensatory trunk lean to L [facility]  Functional ER combo    Functional IR combo    Knee extension    Ankle dorsiflexion     (Blank rows = not tested) (Key: WFL = within functional limits not formally assessed, * = concordant pain, s = stiffness/stretching sensation, NT = not tested)  Comments:    STRENGTH TESTING:  MMT Right eval Left eval  Shoulder flexion    Shoulder abduction    Elbow flexion    Elbow extension    Grip strength (gross)    Hip flexion    Hip  abduction (modified sitting) 5 5  Knee flexion 4+ 4+  Knee extension 4+ 4+  Ankle dorsiflexion    Ankle plantarflexion     (Blank rows = not tested) (Key: WFL = within functional limits not formally assessed, * = concordant pain, s = stiffness/stretching sensation, NT = not tested)  Comments:     FUNCTIONAL TESTS:  5 times sit to stand: 12.83 sec, no UE support but noted retropulsion, difficulty with forward trunk lean  TUG: 19 sec, no AD, UE support from chair  01/04/23: SLS 5 sec each; Tandem stance 10 sec each  01/15/23: TUG 20sec w/ SPC UE support from chair    GAIT: Distance walked: within clinic Assistive device utilized: None Level of assistance: Complete Independence Comments: wide BOS, shuffling steps, tendency for reduced trunk/rotation and arm swing    Dynamic Gait Index:   1. Gait Level Surface; 2  2. Change in Gait Speed; 1  3. Gait With Horizontal Head Turns;2  4. Gait With Vertical Head Turns; 2  5. Gait and Pivot Turn; 1  6. Step Over Obstacle; 1  7. Step Around Obstacles; 2  8. Steps; 1   Grading scale: 3 No impairment; 2 Mild impairment/AD use; 1 Moderate impairment; 0 Severe impairment or unable to perform   Total score: 12/24  Cutoff score for fall risk: </=19/24   TODAY'S TREATMENT:  [facility] Adult PT Treatment:                                                DATE: 01/28/2023 Therapeutic Exercise: 1 x 10 sit with SPC in the from Seated hip 3 x 15 with RTB  Seated hip flexion alternating L/R 3 x 10 L/R with RTB  Updated HEP for seated hip abduction  Therapeutic Activity: Stepping over 1/2 bolster x 10, progressed to stepping over 1 hurdle x 10. Mod verbal cues for foot placement and proper placement of SPC in LUE.     [facility] Adult PT Treatment:                                                DATE: 01/22/2023 Therapeutic  Exercise: Sit to stand from chair for lowered surface with max cues for proper form 3 x 8 Seated marching while seated on dyna-disc 2 x 10 LAQ 1 x 10  Provided handout for HEP  Neuromuscular re-ed: Standing rhomberg position 1 x 30 sec EO with min,  Standing rhomberg position 1 x 30 sec EC with max sway and posterior lean noted Standing ankle strategy rocking on floor 2 x 10 Static stance on airex pad 2 x 30 sec with EO with min postural sway Therapeutic Activity: Steps on sit to stand scooting to EOM, bending knees, rocking forward requiring max cues both verbal, tactile and demonstration    [facility] Adult PT Treatment:                                                DATE: 01/15/23 Therapeutic Exercise: STS x10 from mat BW cues for trunk lean  STS + upper body reach unilat 2x10 for improved trunk lean  Gait Training (SPC, CGA and 44ft runway):  SPC sizing/adjustment, education on appropriate use Turn practice w/ cones of varying darkness/brightness - equal errors, 3 laps, cues for widened radius  Turn practice - figure 8 for increased complexity, cues for radius and step length Turn practice - using 2 cones for visual cueing to maintain appropriate radius, 4 laps, use of visual cues/guidance Rest breaks between with education on sequencing, step mechanics, and safety Therapeutic Activity: TUG + education FOTO + education  PATIENT EDUCATION:  Education details: rationale for interventions Person educated: Patient and friend/aide Education method: Explanation, Demonstration, Tactile cues, Verbal cues Education comprehension: verbalized understanding, returned demonstration, verbal cues required, tactile cues required, and needs further education    HOME EXERCISE PROGRAM: Access Code: [email or web address] URL: [email or web address] Date: 01/28/2023 Prepared by: Lulu Riding  Exercises - Seated March  - 1 x daily - 7 x weekly - 2 sets - 10 reps - Seated Gluteal Sets  - 1 x  daily - 7 x weekly - 2 sets - 10 reps - Seated Long Arc Quad  - 1 x daily - 7 x weekly - 3 sets - 10 reps - 3 seconds hold - Seated Transversus Abdominis Bracing  - 2-3 x daily - 7 x weekly - 2 sets - 10 reps - 10 seconds hold - Sit to Stand  Without Arm Support  - 1 x daily - 7 x weekly - 2 sets - 10 reps - Seated Hip Abduction with Resistance  - 1 x daily - 7 x weekly - 3 sets - 10 - 15 reps  ASSESSMENT:  CLINICAL IMPRESSION: 01/28/2023 Pt arrives to session reproting no pain but did note some soreness following last session. She is demonstrating improvement in sit to stand biomechanics. Continued working on LE strengthening with empahsis on muscles for ambulation to facilitate foot clearance with gait. Practied navigating over varying height of objects which he required frequent cues for foot placement and using the SPC in the correct hand.   EVAL: Pt arrives on this date for balance issues that have been ongoing for several years - per her report (corroborated by friend/aide) these issues have actually improved recently, but notes most difficulty as the day goes on with increased fatigue. On exam, pt demonstrates difficulty with dual tasking and challenges during DGI testing. TUG score is indicative of fall risk, as is DGI. In regards to gait impairments as above, pt endorses neurological history (stroke, brain tumor with surgery); unsure if these impairments are attributable to these past conditions as pt states she is no longer following with neurology, although she notes these impairments have been present for at least 10 years (corroborated by friend/aide) and actually seem to be improving. No overt focal weakness, but functional weakness with transfers noted. Pt tolerates exam well, no adverse events. Recommend skilled PT to address aforementioned deficits to improve functional independence/tolerance and reduce fall risk. Pt departs today's session in no acute distress, all voiced  questions/concerns addressed appropriately from PT perspective.    OBJECTIVE IMPAIRMENTS: Abnormal gait, decreased activity tolerance, decreased balance, decreased endurance, difficulty walking, improper body mechanics, postural dysfunction, and pain.   ACTIVITY LIMITATIONS: standing, stairs, transfers, bathing, and locomotion level  PARTICIPATION LIMITATIONS: meal prep, cleaning, laundry, driving, and community activity  PERSONAL FACTORS: Age, Time since onset of injury/illness/exacerbation, and 3+ comorbidities: anxiety/depression, HTN, osteopenia, cancer hx, DM2, hx stroke  are also affecting patient's functional outcome.   REHAB POTENTIAL: Fair given chronicity and comorbidities  CLINICAL DECISION MAKING: Evolving/moderate complexity  EVALUATION COMPLEXITY: Moderate   GOALS: Goals reviewed with patient? No  SHORT TERM GOALS: Target date: 01/08/2023 Pt will demonstrate appropriate understanding and performance of initially prescribed HEP in order to facilitate improved independence with management of symptoms.  Baseline: HEP TBD 01/10/23: reports fair compliance with HEP  Goal status: MET   2. Pt will improve at least MCID on FOTO in order to demonstrate improved perception of function due to symptoms.  Baseline: FOTO TBD  01/15/23: 50  Goal status: ONGOING  LONG TERM GOALS: Target date: 02/05/2023   Pt will meet predicted score on FOTO in order to demonstrate improved perception of functional status due to symptoms.  Baseline: FOTO TBD Goal status: INITIAL  2. Pt will be able to perform TUG in less than or equal to 15sec sec in order to indicate reduced risk of falling (cutoff score for fall risk 13.5 sec in community dwelling older adults per [facility] et al, 2000)  Baseline: 19sec  Goal status: INITIAL    3.  Pt will perform Dynamic Gait Index with score greater than or equal to 17/24 in order to indicate reduced fall risk (<19/24 predictive of falls in elderly per  [facility] et al 1995, MCID 1.9 per Pardasaney et al 2012)  Baseline: 12/24  Goal status: INITIAL    4. Pt will  perform 5xSTS in <12 sec and mechanics grossly WNL in order to demonstrate reduced fall risk and improved functional independence. (MCID of 2.3sec)  Baseline: 12 sec w/o UE support, altered mechanics w/ noted retropulsion and mild shift towards LLE   Goal status: INITIAL   PLAN:  PT FREQUENCY: 2x/week  PT DURATION: 8 weeks  PLANNED INTERVENTIONS: Therapeutic exercises, Therapeutic activity, Neuromuscular re-education, Balance training, Gait training, Patient/Family education, Self Care, Stair training, Vestibular training, Cryotherapy, Taping, Manual therapy, and Re-evaluation.  PLAN FOR NEXT SESSION: HEP update.  Continue working on balance and safety w/ mobility.      PT, DPT, LAT, ATC  01/28/23  11:09 AM

## 2023-01-29 NOTE — Therapy (Signed)
OUTPATIENT PHYSICAL THERAPY TREATMENT   Patient Name: Yvonne [name] MRN: [id number] DOB:[birth year], 82 y.o., female Today's Date: 01/30/2023  END OF SESSION:  PT End of Session - 01/30/23 1147     Visit Number 7    Number of Visits 17    Date for PT Re-Evaluation 02/05/23    Authorization Type [employer]; approved 12 visits    Authorization Time Period 12/11/22-01/26/23    Authorization - Visit Number 6    Authorization - Number of Visits 12    Progress Note Due on Visit 10    PT Start Time 1149   late check in   PT Stop Time 1230    PT Time Calculation (min) 41 min    Activity Tolerance Patient tolerated treatment well    Behavior During Therapy [facility] for tasks assessed/performed                  Past Medical History:  Diagnosis Date   Anemia    Anxiety    Arthritis    Bilateral carotid artery disease (HCC) 02/04/2017   Carotid US 3/22: R 1-39; L 100   Depression, recurrent (HCC) 02/10/2019   Hypertension    Hypothyroidism    Impaired functional mobility, balance, gait, and endurance 09/02/2015   Malignant neoplasm of lower-outer quadrant of left breast of female, estrogen receptor positive (HCC) 04/24/2021   Mild cognitive impairment with memory loss 09/02/2015   Osteopenia    Pre-diabetes    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 Clifton James & Dr. Laneta Simmers   Severe aortic stenosis    Stroke [facility])    pt was 20   Past Surgical History:  Procedure Laterality Date   ABDOMINAL HYSTERECTOMY     BACK SURGERY     2019   BRAIN SURGERY     age 66's- steel plate in back of head   BREAST LUMPECTOMY WITH RADIOACTIVE SEED AND SENTINEL LYMPH NODE BIOPSY Left 05/17/2021   Procedure: LEFT BREAST SEED LOCALIZED LUMPECTOMY (BRACKETED) WITH SENTINEL LYMPH NODE BIOPSY;  Surgeon: Harriette Bouillon, MD;  Location: [facility] OR;  Service: General;  Laterality: Left;  PEC BLOCK 90 MINUTES ROOM 9    CATARACT EXTRACTION Bilateral    INTRAOPERATIVE TRANSTHORACIC ECHOCARDIOGRAM N/A 06/26/2022   Procedure: INTRAOPERATIVE TRANSTHORACIC ECHOCARDIOGRAM;  Surgeon: Kathleene Hazel, MD;  Location: [facility] OR;  Service: Open Heart Surgery;  Laterality: N/A;   RE-EXCISION OF BREAST LUMPECTOMY Left 06/08/2021   Procedure: RE-EXCISION OF LEFT BREAST LUMPECTOMY;  Surgeon: Harriette Bouillon, MD;  Location: [facility] ORS;  Service: General;  Laterality: Left;   RIGHT HEART CATH AND CORONARY ANGIOGRAPHY N/A 05/17/2022   Procedure: RIGHT HEART CATH AND CORONARY ANGIOGRAPHY;  Surgeon: Kathleene Hazel, MD;  Location: [facility] INVASIVE CV LAB;  Service: Cardiovascular;  Laterality: N/A;   TOE AMPUTATION  2013   2nd toe on right foot, hammer toe   TONSILLECTOMY     TOOTH EXTRACTION     all teeth removed   TOTAL HIP ARTHROPLASTY Left 03/19/2017   Procedure: LEFT TOTAL HIP ARTHROPLASTY ANTERIOR APPROACH;  Surgeon: Kathryne Hitch, MD;  Location: [facility] OR;  Service: Orthopedics;  Laterality: Left;   TRANSCATHETER AORTIC VALVE REPLACEMENT, TRANSFEMORAL N/A 06/26/2022   Procedure: TRANSCATHETR AORTIC VALVE REPLACEMENT, TRANSFEMORAL;  Surgeon: Kathleene Hazel, MD;  Location: [facility] OR;  Service: Open Heart Surgery;  Laterality: N/A;   ULTRASOUND GUIDANCE FOR VASCULAR ACCESS Bilateral  06/26/2022   Procedure: ULTRASOUND GUIDANCE FOR VASCULAR ACCESS;  Surgeon: Kathleene Hazel, MD;  Location: [facility] OR;  Service: Open Heart Surgery;  Laterality: Bilateral;   Patient Active Problem List   Diagnosis Date Noted   SVT (supraventricular tachycardia) 12/17/2022   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   Aneurysm of thoracic aorta (HCC) 06/04/2022   Pulmonary nodule 06/04/2022   Thyroid nodule 06/04/2022   Malignant neoplasm of lower-outer quadrant of left breast of female, estrogen receptor positive (HCC) 04/24/2021   Controlled type 2 diabetes  mellitus with chronic kidney disease, without long-term current use of insulin (HCC) 11/27/2019   Memory impairment 08/31/2019   Depression, recurrent (HCC) 02/10/2019   Degenerative spondylolisthesis 06/06/2018   Sensorineural hearing loss (SNHL), 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   Bilateral carotid artery disease (HCC) 02/04/2017   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   Osteopenia 08/30/2014   Severe aortic stenosis 12/12/2009   Hypothyroidism 07/12/2009   INSOMNIA, PERSISTENT 07/12/2009   Essential hypertension 07/12/2009    PCP: No PCP in chart  REFERRING PROVIDER: Edwin Cap, DPM  REFERRING DIAG: G62.89 (ICD-10-CM) - Other polyneuropathy R26.89 (ICD-10-CM) - Balance disorder  Rationale for Evaluation and Treatment: Rehabilitation  THERAPY DIAG:  Unsteadiness on feet  Muscle weakness (generalized)  ONSET DATE: several years  SUBJECTIVE:                                                                                                                                                                                           SUBJECTIVE STATEMENT: 01/30/2023 Reports some soreness, denies any overt pain. Overall no new changes. No new falls, does note that she has been using her cane more often. Feels therapy is helping her strength.    EVAL: Balance is worse in evenings, occasional steadying assist from aide and SPC use for community distances. More difficulty with uneven surfaces (yard). Difficulty with stairs, needs bannister support States she occasionally gets dizzy, also at night, states she has spoken with provider about it and has improved with medication changes. Does endorse tingling in her feet and hands which she attributes to neuropathy and has been ongoing for years. States balance actually seems to be improving recently, has had issues for several years.  Today's observed gait mechanics have been present for at least 10 years per her aide, also describes some previous freezing  behavior (several years ago) but this has reportedly resolved  PERTINENT HISTORY:  anxiety/depression, anemia, HTN, hx cancer, osteopenia, s/p TAVR Jan 2024, hx stroke, carotid artery disease, DM2 w/ CKD, HOH, hx brain tumor with surgery   PAIN:  No  pain   PRECAUTIONS: fall risk, hx cancer, cardiovascular hx, neurological hx, HOH  WEIGHT BEARING RESTRICTIONS: No  FALLS:  Has patient fallen in last 6 months? 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  PLOF: assistance w/ ADLs/housework  PATIENT GOALS: improve balance  NEXT MD VISIT: sees PCP every 3 months  OBJECTIVE: (objective measures completed at initial evaluation unless otherwise dated)   DIAGNOSTIC FINDINGS:  No recent imaging pertaining to this episode   PATIENT SURVEYS:  FOTO deferred on eval given time constraints 01/04/23: Deferred- system down 01/15/23: 50 current, 50 predicted  COGNITION: Overall cognitive status: Within functional limits for tasks assessed - confounded by hearing impairments, she does endorse some memory issues, aide/friend corroborates/assists with history at times   SENSATION: Light touch intact B UE and LE  Mild incoordination B UE, R seems more affected than L (pt attributes this to prior stroke) Does have lateral deviation of R eye, states this has been present since at least adolescence   POSTURE: mild lean to L, increased kyphosis, increased postural sway in standing (tendency towards posterior)  PALPATION: Not indicated   RANGE OF MOTION:     Active  Right eval Left eval  Shoulder flexion Reduced with compensatory trunk lean to L [facility]  Functional ER combo    Functional IR combo    Knee extension    Ankle dorsiflexion     (Blank rows = not  tested) (Key: WFL = within functional limits not formally assessed, * = concordant pain, s = stiffness/stretching sensation, NT = not tested)  Comments:    STRENGTH TESTING:  MMT Right eval Left eval  Shoulder flexion    Shoulder abduction    Elbow flexion    Elbow extension    Grip strength (gross)    Hip flexion    Hip abduction (modified sitting) 5 5  Knee flexion 4+ 4+  Knee extension 4+ 4+  Ankle dorsiflexion    Ankle plantarflexion     (Blank rows = not tested) (Key: WFL = within functional limits not formally assessed, * = concordant pain, s = stiffness/stretching sensation, NT = not tested)  Comments:     FUNCTIONAL TESTS:  5 times sit to stand: 12.83 sec, no UE support but noted retropulsion, difficulty with forward trunk lean  TUG: 19 sec, no AD, UE support from chair  01/04/23: SLS 5 sec each; Tandem stance 10 sec each  01/15/23: TUG 20sec w/ SPC UE support from chair    GAIT: Distance walked: within clinic Assistive device utilized: None Level of assistance: Complete Independence Comments: wide BOS, shuffling steps, tendency for reduced trunk/rotation and arm swing    Dynamic Gait Index:   1. Gait Level Surface; 2  2. Change in Gait Speed; 1  3. Gait With Horizontal Head Turns;2  4. Gait With Vertical Head Turns; 2  5. Gait and Pivot Turn; 1  6. Step Over Obstacle; 1  7. Step Around Obstacles; 2  8. Steps; 1   Grading scale: 3 No impairment; 2 Mild impairment/AD use; 1 Moderate impairment; 0 Severe impairment or unable to perform   Total score: 12/24  Cutoff score for fall risk: </=19/24   TODAY'S TREATMENT:  [facility] Adult PT Treatment:                                                DATE: 01/30/23 Neuromuscular re-ed: Eyes closed static stance 2x30sec close CGA  Eyes closed static stance w/ dual tasking (math problems) 1 bout to  fatigue, CGA with intermittent minA to mitigate LOB Fwd weight shifting, standing behind yellow line  x12 each UE, cues for truncal dissociation, CGA for safety Cone taps 2x8 BIL at counter cues for pacing and posture Therapeutic Activity: 2x8 STS w/ SPC in front  4 inch step up 2x8 BIL with treadmill UE support cues for pacing and step length    [facility] Adult PT Treatment:                                                DATE: 01/28/2023 Therapeutic Exercise: 1 x 10 sit with SPC in the from Seated hip 3 x 15 with RTB  Seated hip flexion alternating L/R 3 x 10 L/R with RTB  Updated HEP for seated hip abduction  Therapeutic Activity: Stepping over 1/2 bolster x 10, progressed to stepping over 1 hurdle x 10. Mod verbal cues for foot placement and proper placement of SPC in LUE.     [facility] Adult PT Treatment:                                                DATE: 01/22/2023 Therapeutic Exercise: Sit to stand from chair for lowered surface with max cues for proper form 3 x 8 Seated marching while seated on dyna-disc 2 x 10 LAQ 1 x 10  Provided handout for HEP  Neuromuscular re-ed: Standing rhomberg position 1 x 30 sec EO with min,  Standing rhomberg position 1 x 30 sec EC with max sway and posterior lean noted Standing ankle strategy rocking on floor 2 x 10 Static stance on airex pad 2 x 30 sec with EO with min postural sway Therapeutic Activity: Steps on sit to stand scooting to EOM, bending knees, rocking forward requiring max cues both verbal, tactile and demonstration    [facility] Adult PT Treatment:                                                DATE: 01/15/23 Therapeutic Exercise: STS x10 from mat BW cues for trunk lean  STS + upper body reach unilat 2x10 for improved trunk lean  Gait Training (SPC, CGA and 57ft runway):  SPC sizing/adjustment, education on appropriate use Turn practice w/ cones of varying darkness/brightness - equal errors, 3 laps, cues for widened radius  Turn  practice - figure 8 for increased complexity, cues for radius and step length Turn practice - using 2 cones for visual cueing to maintain appropriate radius, 4 laps, use of visual cues/guidance Rest breaks between with education on sequencing, step mechanics, and safety Therapeutic Activity: TUG + education FOTO + education  PATIENT EDUCATION:  Education details:  rationale for interventions Person educated: Patient and friend/aide Education method: Explanation, Demonstration, Tactile cues, Verbal cues Education comprehension: verbalized understanding, returned demonstration, verbal cues required, tactile cues required, and needs further education    HOME EXERCISE PROGRAM: Access Code: [email or web address] URL: [email or web address] Date: 01/28/2023 Prepared by: Lulu Riding  Exercises - Seated March  - 1 x daily - 7 x weekly - 2 sets - 10 reps - Seated Gluteal Sets  - 1 x daily - 7 x weekly - 2 sets - 10 reps - Seated Long Arc Quad  - 1 x daily - 7 x weekly - 3 sets - 10 reps - 3 seconds hold - Seated Transversus Abdominis Bracing  - 2-3 x daily - 7 x weekly - 2 sets - 10 reps - 10 seconds hold - Sit to Stand Without Arm Support  - 1 x daily - 7 x weekly - 2 sets - 10 reps - Seated Hip Abduction with Resistance  - 1 x daily - 7 x weekly - 3 sets - 10 - 15 reps  ASSESSMENT:  CLINICAL IMPRESSION: 01/30/2023 Pt arrives w/ report of soreness but overall improving strength levels and functional mobility, no new falls. Continues to demonstrate improvement with STS mechanics particularly with ascent, continues to have difficulty controlling descent. Continuing to spend more time with postural stability challenges with various tasks including eyes closed stance w/ addition of cognitive tasks, as well as activities attempting to facilitate improved truncal dissociation and fwd weight shift. No adverse events, tolerates session well and denies any increase in resting pain. CGA with  infrequent minA throughout for safety. Pt departs today's session in no acute distress, all voiced questions/concerns addressed appropriately from PT perspective.    EVAL: Pt arrives on this date for balance issues that have been ongoing for several years - per her report (corroborated by friend/aide) these issues have actually improved recently, but notes most difficulty as the day goes on with increased fatigue. On exam, pt demonstrates difficulty with dual tasking and challenges during DGI testing. TUG score is indicative of fall risk, as is DGI. In regards to gait impairments as above, pt endorses neurological history (stroke, brain tumor with surgery); unsure if these impairments are attributable to these past conditions as pt states she is no longer following with neurology, although she notes these impairments have been present for at least 10 years (corroborated by friend/aide) and actually seem to be improving. No overt focal weakness, but functional weakness with transfers noted. Pt tolerates exam well, no adverse events. Recommend skilled PT to address aforementioned deficits to improve functional independence/tolerance and reduce fall risk. Pt departs today's session in no acute distress, all voiced questions/concerns addressed appropriately from PT perspective.    OBJECTIVE IMPAIRMENTS: Abnormal gait, decreased activity tolerance, decreased balance, decreased endurance, difficulty walking, improper body mechanics, postural dysfunction, and pain.   ACTIVITY LIMITATIONS: standing, stairs, transfers, bathing, and locomotion level  PARTICIPATION LIMITATIONS: meal prep, cleaning, laundry, driving, and community activity  PERSONAL FACTORS: Age, Time since onset of injury/illness/exacerbation, and 3+ comorbidities: anxiety/depression, HTN, osteopenia, cancer hx, DM2, hx stroke  are also affecting patient's functional outcome.   REHAB POTENTIAL: Fair given chronicity and comorbidities  CLINICAL  DECISION MAKING: Evolving/moderate complexity  EVALUATION COMPLEXITY: Moderate   GOALS: Goals reviewed with patient? No  SHORT TERM GOALS: Target date: 01/08/2023 Pt will demonstrate appropriate understanding and performance of initially prescribed HEP in order to facilitate improved independence with management of symptoms.  Baseline: HEP TBD 01/10/23: reports fair  compliance with HEP  Goal status: MET   2. Pt will improve at least MCID on FOTO in order to demonstrate improved perception of function due to symptoms.  Baseline: FOTO TBD  01/15/23: 50  Goal status: ONGOING  LONG TERM GOALS: Target date: 02/05/2023   Pt will meet predicted score on FOTO in order to demonstrate improved perception of functional status due to symptoms.  Baseline: FOTO TBD Goal status: INITIAL  2. Pt will be able to perform TUG in less than or equal to 15sec sec in order to indicate reduced risk of falling (cutoff score for fall risk 13.5 sec in community dwelling older adults per [facility] et al, 2000)  Baseline: 19sec  Goal status: INITIAL    3.  Pt will perform Dynamic Gait Index with score greater than or equal to 17/24 in order to indicate reduced fall risk (<19/24 predictive of falls in elderly per [facility] et al 1995, MCID 1.9 per Pardasaney et al 2012)  Baseline: 12/24  Goal status: INITIAL    4. Pt will perform 5xSTS in <12 sec and mechanics grossly WNL in order to demonstrate reduced fall risk and improved functional independence. (MCID of 2.3sec)  Baseline: 12 sec w/o UE support, altered mechanics w/ noted retropulsion and mild shift towards LLE   Goal status: INITIAL   PLAN:  PT FREQUENCY: 2x/week  PT DURATION: 8 weeks  PLANNED INTERVENTIONS: Therapeutic exercises, Therapeutic activity, Neuromuscular re-education, Balance training, Gait training, Patient/Family education, Self Care, Stair training, Vestibular training, Cryotherapy, Taping, Manual therapy, and  Re-evaluation.  PLAN FOR NEXT SESSION: review/update HEP PRN. Recertification/goals next session to address POC   Ashley Murrain PT, DPT 01/30/2023 12:34 PM

## 2023-01-30 ENCOUNTER — Ambulatory Visit: Payer: Medicare HMO | Admitting: Physical Therapy

## 2023-01-30 ENCOUNTER — Encounter: Payer: Self-pay | Admitting: Physical Therapy

## 2023-01-30 DIAGNOSIS — R2681 Unsteadiness on feet: Secondary | ICD-10-CM | POA: Diagnosis not present

## 2023-01-30 DIAGNOSIS — M6281 Muscle weakness (generalized): Secondary | ICD-10-CM

## 2023-02-04 ENCOUNTER — Encounter: Payer: Self-pay | Admitting: Physical Therapy

## 2023-02-04 ENCOUNTER — Ambulatory Visit: Payer: Medicare HMO | Admitting: Physical Therapy

## 2023-02-04 DIAGNOSIS — R2681 Unsteadiness on feet: Secondary | ICD-10-CM | POA: Diagnosis not present

## 2023-02-04 DIAGNOSIS — M6281 Muscle weakness (generalized): Secondary | ICD-10-CM

## 2023-02-04 NOTE — Therapy (Signed)
OUTPATIENT PHYSICAL THERAPY TREATMENT   Patient Name: Yvonne [name] MRN: [id number] DOB:January 05, 1941, 82 y.o., female Today's Date: 02/04/2023  END OF SESSION:  PT End of Session - 02/04/23 1109     Visit Number 8    Number of Visits 17    Date for PT Re-Evaluation 02/05/23    Authorization Type [employer]; approved 12 visits    Authorization Time Period 12/11/22-01/26/23    Authorization - Visit Number 7    Authorization - Number of Visits 12    Progress Note Due on Visit 10    PT Start Time 1110   pt arrived late   PT Stop Time 1150    PT Time Calculation (min) 40 min    Equipment Utilized During Treatment Gait belt    Activity Tolerance Patient tolerated treatment well    Behavior During Therapy WFL for tasks assessed/performed                   Past Medical History:  Diagnosis Date   Anemia    Anxiety    Arthritis    Bilateral carotid artery disease (HCC) 02/04/2017   Carotid US 3/22: R 1-39; L 100   Depression, recurrent (HCC) 02/10/2019   Hypertension    Hypothyroidism    Impaired functional mobility, balance, gait, and endurance 09/02/2015   Malignant neoplasm of lower-outer quadrant of left breast of female, estrogen receptor positive (HCC) 04/24/2021   Mild cognitive impairment with memory loss 09/02/2015   Osteopenia    Pre-diabetes    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 Clifton James & Dr. Laneta Simmers   Severe aortic stenosis    Stroke [facility])    pt was 46   Past Surgical History:  Procedure Laterality Date   ABDOMINAL HYSTERECTOMY     BACK SURGERY     2019   BRAIN SURGERY     age 42's- steel plate in back of head   BREAST LUMPECTOMY WITH RADIOACTIVE SEED AND SENTINEL LYMPH NODE BIOPSY Left 05/17/2021   Procedure: LEFT BREAST SEED LOCALIZED LUMPECTOMY (BRACKETED) WITH SENTINEL LYMPH NODE BIOPSY;  Surgeon: Harriette Bouillon, MD;  Location: [facility] OR;  Service: General;   Laterality: Left;  PEC BLOCK 90 MINUTES ROOM 9   CATARACT EXTRACTION Bilateral    INTRAOPERATIVE TRANSTHORACIC ECHOCARDIOGRAM N/A 06/26/2022   Procedure: INTRAOPERATIVE TRANSTHORACIC ECHOCARDIOGRAM;  Surgeon: Kathleene Hazel, MD;  Location: [facility] OR;  Service: Open Heart Surgery;  Laterality: N/A;   RE-EXCISION OF BREAST LUMPECTOMY Left 06/08/2021   Procedure: RE-EXCISION OF LEFT BREAST LUMPECTOMY;  Surgeon: Harriette Bouillon, MD;  Location: [facility] ORS;  Service: General;  Laterality: Left;   RIGHT HEART CATH AND CORONARY ANGIOGRAPHY N/A 05/17/2022   Procedure: RIGHT HEART CATH AND CORONARY ANGIOGRAPHY;  Surgeon: Kathleene Hazel, MD;  Location: [facility] INVASIVE CV LAB;  Service: Cardiovascular;  Laterality: N/A;   TOE AMPUTATION  2013   2nd toe on right foot, hammer toe   TONSILLECTOMY     TOOTH EXTRACTION     all teeth removed   TOTAL HIP ARTHROPLASTY Left 03/19/2017   Procedure: LEFT TOTAL HIP ARTHROPLASTY ANTERIOR APPROACH;  Surgeon: Kathryne Hitch, MD;  Location: [facility] OR;  Service: Orthopedics;  Laterality: Left;   TRANSCATHETER AORTIC VALVE REPLACEMENT, TRANSFEMORAL N/A 06/26/2022   Procedure: TRANSCATHETR AORTIC VALVE REPLACEMENT, TRANSFEMORAL;  Surgeon: Kathleene Hazel, MD;  Location: [facility] OR;  Service: Open Heart Surgery;  Laterality: N/A;   ULTRASOUND GUIDANCE FOR VASCULAR ACCESS Bilateral 06/26/2022   Procedure: ULTRASOUND GUIDANCE FOR VASCULAR ACCESS;  Surgeon: Kathleene Hazel, MD;  Location: [facility] OR;  Service: Open Heart Surgery;  Laterality: Bilateral;   Patient Active Problem List   Diagnosis Date Noted   SVT (supraventricular tachycardia) 12/17/2022   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   Aneurysm of thoracic aorta (HCC) 06/04/2022   Pulmonary nodule 06/04/2022   Thyroid nodule 06/04/2022   Malignant neoplasm of lower-outer quadrant of left breast of female, estrogen receptor positive  (HCC) 04/24/2021   Controlled type 2 diabetes mellitus with chronic kidney disease, without long-term current use of insulin (HCC) 11/27/2019   Memory impairment 08/31/2019   Depression, recurrent (HCC) 02/10/2019   Degenerative spondylolisthesis 06/06/2018   Sensorineural hearing loss (SNHL), 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   Bilateral carotid artery disease (HCC) 02/04/2017   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   Osteopenia 08/30/2014   Severe aortic stenosis 12/12/2009   Hypothyroidism 07/12/2009   INSOMNIA, PERSISTENT 07/12/2009   Essential hypertension 07/12/2009    PCP: No PCP in chart  REFERRING PROVIDER: Edwin Cap, DPM  REFERRING DIAG: G62.89 (ICD-10-CM) - Other polyneuropathy R26.89 (ICD-10-CM) - Balance disorder  Rationale for Evaluation and Treatment: Rehabilitation  THERAPY DIAG:  Unsteadiness on feet  Muscle weakness (generalized)  ONSET DATE: several years  SUBJECTIVE:                                                                                                                                                                                           SUBJECTIVE STATEMENT: 02/04/2023 "No falls since the last session, overall doing pretty good."   EVAL: Balance is worse in evenings, occasional steadying assist from aide and SPC use for community distances. More difficulty with uneven surfaces (yard). Difficulty with stairs, needs bannister support States she occasionally gets dizzy, also at night, states she has spoken with provider about it and has improved with medication changes. Does endorse tingling in her feet and hands which she attributes to neuropathy and has been ongoing for years. States balance actually seems to be improving recently, has had issues for several years.  Today's observed gait mechanics have been present for at least 10 years per her aide, also describes some previous freezing behavior (several years ago) but this has reportedly resolved  PERTINENT  HISTORY:  anxiety/depression, anemia, HTN, hx cancer, osteopenia, s/p TAVR Jan 2024, hx stroke, carotid artery disease, DM2 w/ CKD, HOH, hx brain tumor with surgery   PAIN:  No pain   PRECAUTIONS: fall risk, hx cancer, cardiovascular hx, neurological hx,  HOH  WEIGHT BEARING RESTRICTIONS: No  FALLS:  Has patient fallen in last 6 months? 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  PLOF: assistance w/ ADLs/housework  PATIENT GOALS: improve balance  NEXT MD VISIT: sees PCP every 3 months  OBJECTIVE: (objective measures completed at initial evaluation unless otherwise dated)   DIAGNOSTIC FINDINGS:  No recent imaging pertaining to this episode   PATIENT SURVEYS:  FOTO deferred on eval given time constraints 01/04/23: Deferred- system down 01/15/23: 50 current, 50 predicted  COGNITION: Overall cognitive status: Within functional limits for tasks assessed - confounded by hearing impairments, she does endorse some memory issues, aide/friend corroborates/assists with history at times   SENSATION: Light touch intact B UE and LE  Mild incoordination B UE, R seems more affected than L (pt attributes this to prior stroke) Does have lateral deviation of R eye, states this has been present since at least adolescence   POSTURE: mild lean to L, increased kyphosis, increased postural sway in standing (tendency towards posterior)  PALPATION: Not indicated   RANGE OF MOTION:     Active  Right eval Left eval  Shoulder flexion Reduced with compensatory trunk lean to L [facility]  Functional ER combo    Functional IR combo    Knee extension    Ankle dorsiflexion     (Blank rows = not tested) (Key: WFL = within functional limits not formally assessed, * =  concordant pain, s = stiffness/stretching sensation, NT = not tested)  Comments:    STRENGTH TESTING:  MMT Right eval Left eval  Shoulder flexion    Shoulder abduction    Elbow flexion    Elbow extension    Grip strength (gross)    Hip flexion    Hip abduction (modified sitting) 5 5  Knee flexion 4+ 4+  Knee extension 4+ 4+  Ankle dorsiflexion    Ankle plantarflexion     (Blank rows = not tested) (Key: WFL = within functional limits not formally assessed, * = concordant pain, s = stiffness/stretching sensation, NT = not tested)  Comments:     FUNCTIONAL TESTS:  5 times sit to stand: 12.83 sec, no UE support but noted retropulsion, difficulty with forward trunk lean  TUG: 19 sec, no AD, UE support from chair  01/04/23: SLS 5 sec each; Tandem stance 10 sec each  01/15/23: TUG 20sec w/ SPC UE support from chair   GAIT: Distance walked: within clinic Assistive device utilized: None Level of assistance: Complete Independence Comments: wide BOS, shuffling steps, tendency for reduced trunk/rotation and arm swing    Dynamic Gait Index:   1. Gait Level Surface; 2  2. Change in Gait Speed; 1  3. Gait With Horizontal Head Turns;2  4. Gait With Vertical Head Turns; 2  5. Gait and Pivot Turn; 1  6. Step Over Obstacle; 1  7. Step Around Obstacles; 2  8. Steps; 1   Grading scale: 3 No impairment; 2 Mild impairment/AD use; 1 Moderate impairment; 0 Severe impairment or unable to perform   Total score: 12/24  Cutoff score for fall risk: </=19/24   TODAY'S TREATMENT:  [facility] Adult PT Treatment:                                                DATE: 02/04/2023  Neuromuscular re-ed: Stepping over 4 hurdles and stepping on to blue airex pad holding x 30 seconds then turning around on blue airex pad and returning to start x 3. In // with SPC in LUE Toe taps on  hurdle 3 x 12  in // with SPC in LUE Gait training in // with SPC heel strike/ toe of x 4 sets, then progresed outside of // 4 x 25 ft, and finished session walking out of gym x 100 ft   [facility] Adult PT Treatment:                                                DATE: 01/30/23 Neuromuscular re-ed: Eyes closed static stance 2x30sec close CGA  Eyes closed static stance w/ dual tasking (math problems) 1 bout to fatigue, CGA with intermittent minA to mitigate LOB Fwd weight shifting, standing behind yellow line  x12 each UE, cues for truncal dissociation, CGA for safety Cone taps 2x8 BIL at counter cues for pacing and posture Therapeutic Activity: 2x8 STS w/ SPC in front  4 inch step up 2x8 BIL with treadmill UE support cues for pacing and step length    [facility] Adult PT Treatment:                                                DATE: 01/28/2023 Therapeutic Exercise: 1 x 10 sit with SPC in the from Seated hip 3 x 15 with RTB  Seated hip flexion alternating L/R 3 x 10 L/R with RTB  Updated HEP for seated hip abduction  Therapeutic Activity: Stepping over 1/2 bolster x 10, progressed to stepping over 1 hurdle x 10. Mod verbal cues for foot placement and proper placement of SPC in LUE.   PATIENT EDUCATION:  Education details: rationale for interventions Person educated: Patient and friend/aide Education method: Explanation, Demonstration, Tactile cues, Verbal cues Education comprehension: verbalized understanding, returned demonstration, verbal cues required, tactile cues required, and needs further education    HOME EXERCISE PROGRAM: Access Code: [email or web address] URL: [email or web address] Date: 01/28/2023 Prepared by: Lulu Riding  Exercises - Seated March  - 1 x daily - 7 x weekly - 2 sets - 10 reps - Seated Gluteal Sets  - 1 x daily - 7 x weekly - 2 sets - 10 reps - Seated Long Arc Quad  - 1 x daily - 7 x weekly - 3 sets - 10 reps - 3 seconds hold - Seated Transversus  Abdominis Bracing  - 2-3 x daily - 7 x weekly - 2 sets - 10 reps - 10 seconds hold - Sit to Stand Without Arm Support  - 1 x daily - 7 x weekly - 2 sets - 10 reps - Seated Hip Abduction with Resistance  - 1 x daily - 7 x weekly - 3 sets - 10 - 15 reps  ASSESSMENT:  CLINICAL IMPRESSION: 02/04/2023 patient arrives to session reporting overall  doing well with no new falls or issues, and notes she feels she is making progress. Continued focus on gait/ balance training. She did very well with using her SPC keeping it in the appropriate hand throughout session as well as navigating obstacles. Time was taken to review gait per pt noting she has trouble remembering to pick up her feet with walking. She did very well with heel strike/ toe off after demonstration and min cues.     EVAL: Pt arrives on this date for balance issues that have been ongoing for several years - per her report (corroborated by friend/aide) these issues have actually improved recently, but notes most difficulty as the day goes on with increased fatigue. On exam, pt demonstrates difficulty with dual tasking and challenges during DGI testing. TUG score is indicative of fall risk, as is DGI. In regards to gait impairments as above, pt endorses neurological history (stroke, brain tumor with surgery); unsure if these impairments are attributable to these past conditions as pt states she is no longer following with neurology, although she notes these impairments have been present for at least 10 years (corroborated by friend/aide) and actually seem to be improving. No overt focal weakness, but functional weakness with transfers noted. Pt tolerates exam well, no adverse events. Recommend skilled PT to address aforementioned deficits to improve functional independence/tolerance and reduce fall risk. Pt departs today's session in no acute distress, all voiced questions/concerns addressed appropriately from PT perspective.    OBJECTIVE IMPAIRMENTS:  Abnormal gait, decreased activity tolerance, decreased balance, decreased endurance, difficulty walking, improper body mechanics, postural dysfunction, and pain.   ACTIVITY LIMITATIONS: standing, stairs, transfers, bathing, and locomotion level  PARTICIPATION LIMITATIONS: meal prep, cleaning, laundry, driving, and community activity  PERSONAL FACTORS: Age, Time since onset of injury/illness/exacerbation, and 3+ comorbidities: anxiety/depression, HTN, osteopenia, cancer hx, DM2, hx stroke  are also affecting patient's functional outcome.   REHAB POTENTIAL: Fair given chronicity and comorbidities  CLINICAL DECISION MAKING: Evolving/moderate complexity  EVALUATION COMPLEXITY: Moderate   GOALS: Goals reviewed with patient? No  SHORT TERM GOALS: Target date: 01/08/2023 Pt will demonstrate appropriate understanding and performance of initially prescribed HEP in order to facilitate improved independence with management of symptoms.  Baseline: HEP TBD 01/10/23: reports fair compliance with HEP  Goal status: MET   2. Pt will improve at least MCID on FOTO in order to demonstrate improved perception of function due to symptoms.  Baseline: FOTO TBD  01/15/23: 50  Goal status: ONGOING  LONG TERM GOALS: Target date: 02/05/2023   Pt will meet predicted score on FOTO in order to demonstrate improved perception of functional status due to symptoms.  Baseline: FOTO TBD Goal status: INITIAL  2. Pt will be able to perform TUG in less than or equal to 15sec sec in order to indicate reduced risk of falling (cutoff score for fall risk 13.5 sec in community dwelling older adults per [facility] et al, 2000)  Baseline: 19sec  Goal status: INITIAL    3.  Pt will perform Dynamic Gait Index with score greater than or equal to 17/24 in order to indicate reduced fall risk (<19/24 predictive of falls in elderly per [facility] et al 1995, MCID 1.9 per Pardasaney et al 2012)  Baseline: 12/24  Goal status:  INITIAL    4. Pt will perform 5xSTS in <12 sec and mechanics grossly WNL in order to demonstrate reduced fall risk and improved functional independence. (MCID of 2.3sec)  Baseline: 12 sec w/o UE support, altered mechanics  w/ noted retropulsion and mild shift towards LLE   Goal status: INITIAL   PLAN:  PT FREQUENCY: 2x/week  PT DURATION: 8 weeks  PLANNED INTERVENTIONS: Therapeutic exercises, Therapeutic activity, Neuromuscular re-education, Balance training, Gait training, Patient/Family education, Self Care, Stair training, Vestibular training, Cryotherapy, Taping, Manual therapy, and Re-evaluation.  PLAN FOR NEXT SESSION: review/update HEP PRN. Recertification/goals next session to address POC. Review gait biomechanics.    Malvern Kadlec PT, DPT, LAT, ATC  02/04/23  11:54 AM

## 2023-02-05 ENCOUNTER — Ambulatory Visit: Payer: Medicare HMO | Admitting: Physical Therapy

## 2023-02-05 ENCOUNTER — Encounter: Payer: Self-pay | Admitting: Physical Therapy

## 2023-02-05 DIAGNOSIS — R2681 Unsteadiness on feet: Secondary | ICD-10-CM | POA: Diagnosis not present

## 2023-02-05 DIAGNOSIS — M6281 Muscle weakness (generalized): Secondary | ICD-10-CM

## 2023-02-05 NOTE — Therapy (Signed)
OUTPATIENT PHYSICAL THERAPY TREATMENT / Re-certification  Progress Note Reporting Period 12/11/2022 to 02/05/2023  See note below for Objective Data and Assessment of Progress/Goals.       Patient Name: Yvonne [name] MRN: [id number] DOB:1940-09-01, 82 y.o., female Today's Date: 02/05/2023  END OF SESSION:  PT End of Session - 02/05/23 1108     Visit Number 9    Number of Visits 17    Date for PT Re-Evaluation 04/02/23    Authorization Type [employer]; approved 12 visits    Authorization - Visit Number 8    Authorization - Number of Visits 12    Progress Note Due on Visit 19    PT Start Time 1102    PT Stop Time 1145    PT Time Calculation (min) 43 min    Equipment Utilized During Treatment Gait belt    Activity Tolerance Patient tolerated treatment well    Behavior During Therapy WFL for tasks assessed/performed                   Past Medical History:  Diagnosis Date   Anemia    Anxiety    Arthritis    Bilateral carotid artery disease (HCC) 02/04/2017   Carotid US 3/22: R 1-39; L 100   Depression, recurrent (HCC) 02/10/2019   Hypertension    Hypothyroidism    Impaired functional mobility, balance, gait, and endurance 09/02/2015   Malignant neoplasm of lower-outer quadrant of left breast of female, estrogen receptor positive (HCC) 04/24/2021   Mild cognitive impairment with memory loss 09/02/2015   Osteopenia    Pre-diabetes    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 Clifton James & Dr. Laneta Simmers   Severe aortic stenosis    Stroke [facility])    pt was 66   Past Surgical History:  Procedure Laterality Date   ABDOMINAL HYSTERECTOMY     BACK SURGERY     2019   BRAIN SURGERY     age 63's- steel plate in back of head   BREAST LUMPECTOMY WITH RADIOACTIVE SEED AND SENTINEL LYMPH NODE BIOPSY Left 05/17/2021   Procedure: LEFT BREAST SEED LOCALIZED LUMPECTOMY (BRACKETED) WITH  SENTINEL LYMPH NODE BIOPSY;  Surgeon: Harriette Bouillon, MD;  Location: [facility] OR;  Service: General;  Laterality: Left;  PEC BLOCK 90 MINUTES ROOM 9   CATARACT EXTRACTION Bilateral    INTRAOPERATIVE TRANSTHORACIC ECHOCARDIOGRAM N/A 06/26/2022   Procedure: INTRAOPERATIVE TRANSTHORACIC ECHOCARDIOGRAM;  Surgeon: Kathleene Hazel, MD;  Location: [facility] OR;  Service: Open Heart Surgery;  Laterality: N/A;   RE-EXCISION OF BREAST LUMPECTOMY Left 06/08/2021   Procedure: RE-EXCISION OF LEFT BREAST LUMPECTOMY;  Surgeon: Harriette Bouillon, MD;  Location: [facility] ORS;  Service: General;  Laterality: Left;   RIGHT HEART CATH AND CORONARY ANGIOGRAPHY N/A 05/17/2022   Procedure: RIGHT HEART CATH AND CORONARY ANGIOGRAPHY;  Surgeon: Kathleene Hazel, MD;  Location: [facility] INVASIVE CV LAB;  Service: Cardiovascular;  Laterality: N/A;   TOE AMPUTATION  2013   2nd toe on right foot, hammer toe   TONSILLECTOMY     TOOTH EXTRACTION     all teeth removed   TOTAL HIP ARTHROPLASTY Left 03/19/2017   Procedure: LEFT TOTAL HIP ARTHROPLASTY ANTERIOR APPROACH;  Surgeon: Kathryne Hitch, MD;  Location: [facility] OR;  Service: Orthopedics;  Laterality: Left;   TRANSCATHETER AORTIC VALVE REPLACEMENT, TRANSFEMORAL N/A 06/26/2022   Procedure: TRANSCATHETR AORTIC VALVE REPLACEMENT, TRANSFEMORAL;  Surgeon:  Kathleene Hazel, MD;  Location: [facility] OR;  Service: Open Heart Surgery;  Laterality: N/A;   ULTRASOUND GUIDANCE FOR VASCULAR ACCESS Bilateral 06/26/2022   Procedure: ULTRASOUND GUIDANCE FOR VASCULAR ACCESS;  Surgeon: Kathleene Hazel, MD;  Location: [facility] OR;  Service: Open Heart Surgery;  Laterality: Bilateral;   Patient Active Problem List   Diagnosis Date Noted   SVT (supraventricular tachycardia) 12/17/2022   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   Aneurysm of thoracic aorta (HCC) 06/04/2022   Pulmonary nodule 06/04/2022   Thyroid nodule 06/04/2022    Malignant neoplasm of lower-outer quadrant of left breast of female, estrogen receptor positive (HCC) 04/24/2021   Controlled type 2 diabetes mellitus with chronic kidney disease, without long-term current use of insulin (HCC) 11/27/2019   Memory impairment 08/31/2019   Depression, recurrent (HCC) 02/10/2019   Degenerative spondylolisthesis 06/06/2018   Sensorineural hearing loss (SNHL), 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   Bilateral carotid artery disease (HCC) 02/04/2017   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   Osteopenia 08/30/2014   Severe aortic stenosis 12/12/2009   Hypothyroidism 07/12/2009   INSOMNIA, PERSISTENT 07/12/2009   Essential hypertension 07/12/2009    PCP: No PCP in chart  REFERRING PROVIDER: Edwin Cap, DPM  REFERRING DIAG: G62.89 (ICD-10-CM) - Other polyneuropathy R26.89 (ICD-10-CM) - Balance disorder  Rationale for Evaluation and Treatment: Rehabilitation  THERAPY DIAG:  Unsteadiness on feet  Muscle weakness (generalized)  ONSET DATE: several years  SUBJECTIVE:                                                                                                                                                                                           SUBJECTIVE STATEMENT: 02/05/2023 "no falls, doing pretty good. I would like to continue working steppingover items."   EVAL: Balance is worse in evenings, occasional steadying assist from aide and SPC use for community distances. More difficulty with uneven surfaces (yard). Difficulty with stairs, needs bannister support States she occasionally gets dizzy, also at night, states she has spoken with provider about it and has improved with medication changes. Does endorse tingling in her feet and hands which she attributes to neuropathy and has been ongoing for years. States balance actually seems to be improving recently, has had issues for several years.  Today's observed gait mechanics have been present for at least 10 years per her  aide, also describes some previous freezing behavior (several years ago) but this has reportedly resolved  PERTINENT HISTORY:  anxiety/depression, anemia, HTN, hx cancer, osteopenia, s/p TAVR Jan 2024, hx stroke, carotid artery disease, DM2 w/ CKD, HOH, hx brain tumor with surgery  PAIN:  No pain   PRECAUTIONS: fall risk, hx cancer, cardiovascular hx, neurological hx, HOH  WEIGHT BEARING RESTRICTIONS: No  FALLS:  Has patient fallen in last 6 months? 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  PLOF: assistance w/ ADLs/housework  PATIENT GOALS: improve balance  NEXT MD VISIT: sees PCP every 3 months  OBJECTIVE: (objective measures completed at initial evaluation unless otherwise dated)   DIAGNOSTIC FINDINGS:  No recent imaging pertaining to this episode   PATIENT SURVEYS:  FOTO deferred on eval given time constraints 01/04/23: Deferred- system down 01/15/23: 50 current, 50 predicted 02/05/23 noted 38% compared to initial assessment of 50% which is likely result of error.   COGNITION: Overall cognitive status: Within functional limits for tasks assessed - confounded by hearing impairments, she does endorse some memory issues, aide/friend corroborates/assists with history at times   SENSATION: Light touch intact B UE and LE  Mild incoordination B UE, R seems more affected than L (pt attributes this to prior stroke) Does have lateral deviation of R eye, states this has been present since at least adolescence   POSTURE: mild lean to L, increased kyphosis, increased postural sway in standing (tendency towards posterior)  PALPATION: Not indicated   RANGE OF MOTION:     Active  Right eval Left eval  Shoulder flexion Reduced with compensatory trunk  lean to L [facility]  Functional ER combo    Functional IR combo    Knee extension    Ankle dorsiflexion     (Blank rows = not tested) (Key: WFL = within functional limits not formally assessed, * = concordant pain, s = stiffness/stretching sensation, NT = not tested)  Comments:    STRENGTH TESTING:  MMT Right eval Left eval  Shoulder flexion    Shoulder abduction    Elbow flexion    Elbow extension    Grip strength (gross)    Hip flexion    Hip abduction (modified sitting) 5 5  Knee flexion 4+ 4+  Knee extension 4+ 4+  Ankle dorsiflexion    Ankle plantarflexion     (Blank rows = not tested) (Key: WFL = within functional limits not formally assessed, * = concordant pain, s = stiffness/stretching sensation, NT = not tested)  Comments:     FUNCTIONAL TESTS:  5 times sit to stand: 12.83 sec, no UE support but noted retropulsion, difficulty with forward trunk lean  02/05/2023 - 13 seconds (slight pause during rest requiring min cues to continue) with use of SPC   TUG: 19 sec, no AD, UE support from chair 02/05/23 Trial 1 - 19 seconds (with AD), Trial 2- 17 seconds AVG 18 seconds  01/04/23: SLS 5 sec each; Tandem stance 10 sec each  01/15/23: TUG 20sec w/ SPC UE support from chair  02/05/23   GAIT: Distance walked: within clinic Assistive device utilized: None Level of assistance: Complete Independence Comments: wide BOS, shuffling steps, tendency for reduced trunk/rotation and arm swing    Dynamic Gait Index:   1. Gait Level Surface; 2  2. Change in Gait Speed; 1  3. Gait With Horizontal Head Turns;2  4. Gait With Vertical Head Turns; 2  5. Gait and Pivot Turn; 1  6. Step Over Obstacle; 1  7.  Step Around Obstacles; 2  8. Steps; 1   Grading scale: 3 No impairment; 2 Mild impairment/AD use; 1 Moderate impairment; 0 Severe impairment or unable to perform   Total score: 12/24  Cutoff score for fall risk: </=19/24   02/05/2023 DGI: 1. Gait Level Surface; 2  2.  Change in Gait Speed; 2  3. Gait With Horizontal Head Turns;1  4. Gait With Vertical Head Turns; 2  5. Gait and Pivot Turn; 2  6. Step Over Obstacle; 2  7. Step Around Obstacles; 2  8. Steps; 1  Total score 14/24  TODAY'S TREATMENT:                                                                                                                              [facility] Adult PT Treatment:                                                DATE: 02/05/2023  Therapeutic Activity: TUG assessment avg 18 seconds 5 x sit to stand 13 seconds DGI assessment 14/24 Reviewed progress based on testing and how it related to her function and stability.    [facility] Adult PT Treatment:                                                DATE: 02/04/2023  Neuromuscular re-ed: Stepping over 4 hurdles and stepping on to blue airex pad holding x 30 seconds then turning around on blue airex pad and returning to start x 3. In // with SPC in LUE Toe taps on hurdle 3 x 12  in // with SPC in LUE Gait training in // with SPC heel strike/ toe of x 4 sets, then progresed outside of // 4 x 25 ft, and finished session walking out of gym x 100 ft   [facility] Adult PT Treatment:                                                DATE: 01/30/23 Neuromuscular re-ed: Eyes closed static stance 2x30sec close CGA  Eyes closed static stance w/ dual tasking (math problems) 1 bout to fatigue, CGA with intermittent minA to mitigate LOB Fwd weight shifting, standing behind yellow line  x12 each UE, cues for truncal dissociation, CGA for safety Cone taps 2x8 BIL at counter cues for pacing and posture Therapeutic Activity: 2x8 STS w/ SPC in front  4 inch step up 2x8 BIL with treadmill UE support cues for pacing and step length  PATIENT EDUCATION:  Education details: rationale for interventions  Person educated: Patient and friend/aide Education method: Explanation, Demonstration, Tactile cues, Verbal cues Education comprehension: verbalized  understanding, returned demonstration, verbal cues required, tactile cues required, and needs further education    HOME EXERCISE PROGRAM: Access Code: [email or web address] URL: [email or web address] Date: 01/28/2023 Prepared by: Lulu Riding  Exercises - Seated March  - 1 x daily - 7 x weekly - 2 sets - 10 reps - Seated Gluteal Sets  - 1 x daily - 7 x weekly - 2 sets - 10 reps - Seated Long Arc Quad  - 1 x daily - 7 x weekly - 3 sets - 10 reps - 3 seconds hold - Seated Transversus Abdominis Bracing  - 2-3 x daily - 7 x weekly - 2 sets - 10 reps - 10 seconds hold - Sit to Stand Without Arm Support  - 1 x daily - 7 x weekly - 2 sets - 10 reps - Seated Hip Abduction with Resistance  - 1 x daily - 7 x weekly - 3 sets - 10 - 15 reps  ASSESSMENT:  CLINICAL IMPRESSION: 02/05/2023 Mrs Macewen reports she feels she is making good progress since starting PT and notes she has had no falls over the the last few weeks. She does report she feels she could continue to improve with her balance and gait speficially with strengthening and stepping over obstacles. She demonstrated improvement in all her funcitonal measures today with her TUG  at 18 seconds on avg, 5 x STS at 13, and her DGI by 2 points. She is progressing appropriately toward her goals demonstrating improvement in stabilty/ transitions and biomechanics and would benefit from continued physical therapy to improve overall strength, balance/ stability and overall function by addressing the deficits listed.    EVAL: Pt arrives on this date for balance issues that have been ongoing for several years - per her report (corroborated by friend/aide) these issues have actually improved recently, but notes most difficulty as the day goes on with increased fatigue. On exam, pt demonstrates difficulty with dual tasking and challenges during DGI testing. TUG score is indicative of fall risk, as is DGI. In regards to gait impairments as above, pt endorses  neurological history (stroke, brain tumor with surgery); unsure if these impairments are attributable to these past conditions as pt states she is no longer following with neurology, although she notes these impairments have been present for at least 10 years (corroborated by friend/aide) and actually seem to be improving. No overt focal weakness, but functional weakness with transfers noted. Pt tolerates exam well, no adverse events. Recommend skilled PT to address aforementioned deficits to improve functional independence/tolerance and reduce fall risk. Pt departs today's session in no acute distress, all voiced questions/concerns addressed appropriately from PT perspective.    OBJECTIVE IMPAIRMENTS: Abnormal gait, decreased activity tolerance, decreased balance, decreased endurance, difficulty walking, improper body mechanics, postural dysfunction, and pain.   ACTIVITY LIMITATIONS: standing, stairs, transfers, bathing, and locomotion level  PARTICIPATION LIMITATIONS: meal prep, cleaning, laundry, driving, and community activity  PERSONAL FACTORS: Age, Time since onset of injury/illness/exacerbation, and 3+ comorbidities: anxiety/depression, HTN, osteopenia, cancer hx, DM2, hx stroke  are also affecting patient's functional outcome.   REHAB POTENTIAL: Fair given chronicity and comorbidities  CLINICAL DECISION MAKING: Evolving/moderate complexity  EVALUATION COMPLEXITY: Moderate   GOALS: Goals reviewed with patient? No  SHORT TERM GOALS: Target date: 01/08/2023 Pt will demonstrate appropriate understanding and performance of initially prescribed HEP in order to facilitate improved independence with management of symptoms.  Baseline:  HEP TBD 01/10/23: reports fair compliance with HEP  Goal status: MET   2. Pt will improve at least MCID on FOTO in order to demonstrate improved perception of function due to symptoms.  Baseline: FOTO TBD  01/15/23: 50  Goal status: not met Goal no longer due  to FOTO not being accurate.   LONG TERM GOALS: Target date: updated to 04/02/2023   Pt will meet predicted score on FOTO in order to demonstrate improved perception of functional status due to symptoms.  Baseline: FOTO TBD Goal status: not met Goal no longer due to FOTO not being accurate.   2. Pt will be able to perform TUG in less than or equal to 15sec sec in order to indicate reduced risk of falling (cutoff score for fall risk 13.5 sec in community dwelling older adults per [name] et al, 2000)  Baseline: 19sec  Status: 18 seconds  Goal status: progressing  02/05/2023   3.  Pt will perform Dynamic Gait Index with score greater than or equal to 17/24 in order to indicate reduced fall risk (<19/24 predictive of falls in elderly per [facility] et al 1995, MCID 1.9 per Pardasaney et al 2012)  Baseline: 12/24  Status:14/24  Goal status:  progressing  02/05/2023    4. Pt will perform 5xSTS in <12 sec and mechanics grossly WNL in order to demonstrate reduced fall risk and improved functional independence. (MCID of 2.3sec) Baseline: 12 sec w/o UE support, altered mechanics w/ noted retropulsion and mild shift towards LLE  Status: 13 with SPC   Goal status:  progressing  02/05/2023  PLAN:  PT FREQUENCY: 1 - 2 x a week  PT DURATION: 8 weeks  PLANNED INTERVENTIONS: Therapeutic exercises, Therapeutic activity, Neuromuscular re-education, Balance training, Gait training, Patient/Family education, Self Care, Stair training, Vestibular training, Cryotherapy, Taping, Manual therapy, and Re-evaluation.  PLAN FOR NEXT SESSION: review/update HEP PRN. Gait training, gross LE strengthening with emphasis on endurance. Progress balance training.   Maximiliano Cromartie PT, DPT, LAT, ATC  02/05/23  11:48 AM

## 2023-02-14 ENCOUNTER — Ambulatory Visit: Payer: Medicare HMO | Admitting: Physical Therapy

## 2023-02-14 ENCOUNTER — Encounter: Payer: Self-pay | Admitting: Physical Therapy

## 2023-02-14 DIAGNOSIS — R2681 Unsteadiness on feet: Secondary | ICD-10-CM

## 2023-02-14 DIAGNOSIS — M6281 Muscle weakness (generalized): Secondary | ICD-10-CM

## 2023-02-14 NOTE — Therapy (Signed)
OUTPATIENT PHYSICAL THERAPY TREATMENT       Patient Name: GUISEPPINA BANDERA MRN: [id number] DOB:Aug 15, 1940, 82 y.o., female Today's Date: 02/14/2023  END OF SESSION:  PT End of Session - 02/14/23 1102     Visit Number 10    Number of Visits 17    Date for PT Re-Evaluation 04/02/23    Authorization Type [employer]; approved 12 visits    Authorization - Visit Number 9    Authorization - Number of Visits 12    Progress Note Due on Visit 19    PT Start Time 1103    PT Stop Time 1142    PT Time Calculation (min) 39 min    Activity Tolerance Patient tolerated treatment well    Behavior During Therapy WFL for tasks assessed/performed                    Past Medical History:  Diagnosis Date   Anemia    Anxiety    Arthritis    Bilateral carotid artery disease (HCC) 02/04/2017   Carotid US 3/22: R 1-39; L 100   Depression, recurrent (HCC) 02/10/2019   Hypertension    Hypothyroidism    Impaired functional mobility, balance, gait, and endurance 09/02/2015   Malignant neoplasm of lower-outer quadrant of left breast of female, estrogen receptor positive (HCC) 04/24/2021   Mild cognitive impairment with memory loss 09/02/2015   Osteopenia    Pre-diabetes    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 Clifton James & Dr. Laneta Simmers   Severe aortic stenosis    Stroke [facility])    pt was 29   Past Surgical History:  Procedure Laterality Date   ABDOMINAL HYSTERECTOMY     BACK SURGERY     2019   BRAIN SURGERY     age 23's- steel plate in back of head   BREAST LUMPECTOMY WITH RADIOACTIVE SEED AND SENTINEL LYMPH NODE BIOPSY Left 05/17/2021   Procedure: LEFT BREAST SEED LOCALIZED LUMPECTOMY (BRACKETED) WITH SENTINEL LYMPH NODE BIOPSY;  Surgeon: Harriette Bouillon, MD;  Location: [facility] OR;  Service: General;  Laterality: Left;  PEC BLOCK 90 MINUTES ROOM 9   CATARACT EXTRACTION Bilateral    INTRAOPERATIVE  TRANSTHORACIC ECHOCARDIOGRAM N/A 06/26/2022   Procedure: INTRAOPERATIVE TRANSTHORACIC ECHOCARDIOGRAM;  Surgeon: Kathleene Hazel, MD;  Location: [facility] OR;  Service: Open Heart Surgery;  Laterality: N/A;   RE-EXCISION OF BREAST LUMPECTOMY Left 06/08/2021   Procedure: RE-EXCISION OF LEFT BREAST LUMPECTOMY;  Surgeon: Harriette Bouillon, MD;  Location: [facility] ORS;  Service: General;  Laterality: Left;   RIGHT HEART CATH AND CORONARY ANGIOGRAPHY N/A 05/17/2022   Procedure: RIGHT HEART CATH AND CORONARY ANGIOGRAPHY;  Surgeon: Kathleene Hazel, MD;  Location: [facility] INVASIVE CV LAB;  Service: Cardiovascular;  Laterality: N/A;   TOE AMPUTATION  2013   2nd toe on right foot, hammer toe   TONSILLECTOMY     TOOTH EXTRACTION     all teeth removed   TOTAL HIP ARTHROPLASTY Left 03/19/2017   Procedure: LEFT TOTAL HIP ARTHROPLASTY ANTERIOR APPROACH;  Surgeon: Kathryne Hitch, MD;  Location: [facility] OR;  Service: Orthopedics;  Laterality: Left;   TRANSCATHETER AORTIC VALVE REPLACEMENT, TRANSFEMORAL N/A 06/26/2022   Procedure: TRANSCATHETR AORTIC VALVE REPLACEMENT, TRANSFEMORAL;  Surgeon: Kathleene Hazel, MD;  Location: [facility] OR;  Service: Open Heart Surgery;  Laterality: N/A;   ULTRASOUND GUIDANCE FOR VASCULAR ACCESS Bilateral 06/26/2022   Procedure: ULTRASOUND  GUIDANCE FOR VASCULAR ACCESS;  Surgeon: Kathleene Hazel, MD;  Location: [facility] OR;  Service: Open Heart Surgery;  Laterality: Bilateral;   Patient Active Problem List   Diagnosis Date Noted   SVT (supraventricular tachycardia) 12/17/2022   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   Aneurysm of thoracic aorta (HCC) 06/04/2022   Pulmonary nodule 06/04/2022   Thyroid nodule 06/04/2022   Malignant neoplasm of lower-outer quadrant of left breast of female, estrogen receptor positive (HCC) 04/24/2021   Controlled type 2 diabetes mellitus with chronic kidney disease, without long-term  current use of insulin (HCC) 11/27/2019   Memory impairment 08/31/2019   Depression, recurrent (HCC) 02/10/2019   Degenerative spondylolisthesis 06/06/2018   Sensorineural hearing loss (SNHL), 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   Bilateral carotid artery disease (HCC) 02/04/2017   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   Osteopenia 08/30/2014   Severe aortic stenosis 12/12/2009   Hypothyroidism 07/12/2009   INSOMNIA, PERSISTENT 07/12/2009   Essential hypertension 07/12/2009    PCP: No PCP in chart  REFERRING PROVIDER: Edwin Cap, DPM  REFERRING DIAG: G62.89 (ICD-10-CM) - Other polyneuropathy R26.89 (ICD-10-CM) - Balance disorder  Rationale for Evaluation and Treatment: Rehabilitation  THERAPY DIAG:  Unsteadiness on feet  Muscle weakness (generalized)  ONSET DATE: several years  SUBJECTIVE:                                                                                                                                                                                           SUBJECTIVE STATEMENT: 02/14/2023 States she felt pretty good after last session but did get worked out, a bit of soreness. No issues today, no new falls.   EVAL: Balance is worse in evenings, occasional steadying assist from aide and SPC use for community distances. More difficulty with uneven surfaces (yard). Difficulty with stairs, needs bannister support States she occasionally gets dizzy, also at night, states she has spoken with provider about it and has improved with medication changes. Does endorse tingling in her feet and hands which she attributes to neuropathy and has been ongoing for years. States balance actually seems to be improving recently, has had issues for several years.  Today's observed gait mechanics have been present for at least 10 years per her aide, also describes some previous freezing behavior (several years ago) but this has reportedly resolved  PERTINENT HISTORY:  anxiety/depression, anemia, HTN, hx  cancer, osteopenia, s/p TAVR Jan 2024, hx stroke, carotid artery disease, DM2 w/ CKD, HOH, hx brain tumor with surgery   PAIN:  No pain   PRECAUTIONS: fall risk, hx cancer, cardiovascular hx, neurological hx, HOH  WEIGHT BEARING RESTRICTIONS: No  FALLS:  Has patient fallen in last 6 months? 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  PLOF: assistance w/ ADLs/housework  PATIENT GOALS: improve balance  NEXT MD VISIT: sees PCP every 3 months  OBJECTIVE: (objective measures completed at initial evaluation unless otherwise dated)   DIAGNOSTIC FINDINGS:  No recent imaging pertaining to this episode   PATIENT SURVEYS:  FOTO deferred on eval given time constraints 01/04/23: Deferred- system down 01/15/23: 50 current, 50 predicted 02/05/23 noted 38% compared to initial assessment of 50% which is likely result of error.   COGNITION: Overall cognitive status: Within functional limits for tasks assessed - confounded by hearing impairments, she does endorse some memory issues, aide/friend corroborates/assists with history at times   SENSATION: Light touch intact B UE and LE  Mild incoordination B UE, R seems more affected than L (pt attributes this to prior stroke) Does have lateral deviation of R eye, states this has been present since at least adolescence   POSTURE: mild lean to L, increased kyphosis, increased postural sway in standing (tendency towards posterior)  PALPATION: Not indicated   RANGE OF MOTION:     Active  Right eval Left eval  Shoulder flexion Reduced with compensatory trunk lean to L [facility]  Functional ER combo    Functional IR combo    Knee extension    Ankle dorsiflexion     (Blank rows = not tested) (Key: WFL = within  functional limits not formally assessed, * = concordant pain, s = stiffness/stretching sensation, NT = not tested)  Comments:    STRENGTH TESTING:  MMT Right eval Left eval  Shoulder flexion    Shoulder abduction    Elbow flexion    Elbow extension    Grip strength (gross)    Hip flexion    Hip abduction (modified sitting) 5 5  Knee flexion 4+ 4+  Knee extension 4+ 4+  Ankle dorsiflexion    Ankle plantarflexion     (Blank rows = not tested) (Key: WFL = within functional limits not formally assessed, * = concordant pain, s = stiffness/stretching sensation, NT = not tested)  Comments:     FUNCTIONAL TESTS:  5 times sit to stand: 12.83 sec, no UE support but noted retropulsion, difficulty with forward trunk lean  02/05/2023 - 13 seconds (slight pause during rest requiring min cues to continue) with use of SPC   TUG: 19 sec, no AD, UE support from chair 02/05/23 Trial 1 - 19 seconds (with AD), Trial 2- 17 seconds AVG 18 seconds  01/04/23: SLS 5 sec each; Tandem stance 10 sec each  01/15/23: TUG 20sec w/ SPC UE support from chair  02/05/23   GAIT: Distance walked: within clinic Assistive device utilized: None Level of assistance: Complete Independence Comments: wide BOS, shuffling steps, tendency for reduced trunk/rotation and arm swing    Dynamic Gait Index:   1. Gait Level Surface; 2  2. Change in Gait Speed; 1  3. Gait With Horizontal Head Turns;2  4. Gait With Vertical Head Turns; 2  5. Gait and Pivot Turn; 1  6. Step Over Obstacle; 1  7. Step Around Obstacles; 2  8. Steps; 1   Grading scale: 3 No impairment; 2 Mild  impairment/AD use; 1 Moderate impairment; 0 Severe impairment or unable to perform   Total score: 12/24  Cutoff score for fall risk: </=19/24   02/05/2023 DGI: 1. Gait Level Surface; 2  2. Change in Gait Speed; 2  3. Gait With Horizontal Head Turns;1  4. Gait With Vertical Head Turns; 2  5. Gait and Pivot Turn; 2  6. Step Over Obstacle; 2  7.  Step Around Obstacles; 2  8. Steps; 1  Total score 14/24  TODAY'S TREATMENT:                                                                                                                              [facility] Adult PT Treatment:                                                DATE: 02/14/23 Therapeutic Exercise: Standing hurdle taps 2x10 BIL at counter cues for posture Cone lateral step overs 3x5 BIL LE with counter support TUG blocked practice x4 for muscular endurance cues for pacing and at least rest between, cues for velocity, CGA HEP update + education/handout  Neuromuscular re-ed: Gait 4 bouts 32ft with cues for reducing # of steps each bout to increase amplitude of movement, CGA for safety w/ SPC (20 steps, 21 steps, 18 steps, 19 steps) STS blocked practice 3x6 with cross body reach (<90 deg shoulder elevation for comfort) for truncal dissociation and core activation during transfers    [facility] Adult PT Treatment:                                                DATE: 02/05/2023  Therapeutic Activity: TUG assessment avg 18 seconds 5 x sit to stand 13 seconds DGI assessment 14/24 Reviewed progress based on testing and how it related to her function and stability.    [facility] Adult PT Treatment:                                                DATE: 02/04/2023  Neuromuscular re-ed: Stepping over 4 hurdles and stepping on to blue airex pad holding x 30 seconds then turning around on blue airex pad and returning to start x 3. In // with SPC in LUE Toe taps on hurdle 3 x 12  in // with SPC in LUE Gait training in // with SPC heel strike/ toe of x 4 sets, then progresed outside of // 4 x 25 ft, and finished session walking out of gym x 100 ft   [facility] Adult PT Treatment:  DATE: 01/30/23 Neuromuscular re-ed: Eyes closed static stance 2x30sec close CGA  Eyes closed static stance w/ dual tasking (math problems) 1 bout to fatigue, CGA with  intermittent minA to mitigate LOB Fwd weight shifting, standing behind yellow line  x12 each UE, cues for truncal dissociation, CGA for safety Cone taps 2x8 BIL at counter cues for pacing and posture Therapeutic Activity: 2x8 STS w/ SPC in front  4 inch step up 2x8 BIL with treadmill UE support cues for pacing and step length  PATIENT EDUCATION:  Education details: rationale for interventions Person educated: Patient and friend/aide Education method: Explanation, Demonstration, Tactile cues, Verbal cues Education comprehension: verbalized understanding, returned demonstration, verbal cues required, tactile cues required, and needs further education    HOME EXERCISE PROGRAM: Access Code: [email or web address] URL: [email or web address] Date: 02/14/2023 Prepared by: Fransisco Hertz  Exercises - Seated Gluteal Sets  - 1 x daily - 7 x weekly - 2 sets - 10 reps - Seated Long Arc Quad  - 1 x daily - 7 x weekly - 3 sets - 10 reps - 3 seconds hold - Seated Transversus Abdominis Bracing  - 2-3 x daily - 7 x weekly - 2 sets - 10 reps - 10 seconds hold - Sit to Stand Without Arm Support  - 1 x daily - 7 x weekly - 2 sets - 10 reps - Seated Hip Abduction with Resistance  - 1 x daily - 7 x weekly - 3 sets - 10 - 15 reps - Standing March with Counter Support  - 1 x daily - 7 x weekly - 2 sets - 10 reps  ASSESSMENT:  CLINICAL IMPRESSION: 02/14/2023 Pt arrives w/o pain, notes she was a bit sore after last session but nothing unusual. States she is feeling stronger and her shoulders are bothering her less, which is evidenced by improved tolerance to reaching/truncal dissociation exercises today. Continues to work on amplitude of movement and dynamic postural stability. Tolerates well with intermittent rest breaks, no adverse events or pain. Pt departs today's session in no acute distress, all voiced questions/concerns addressed appropriately from PT perspective.     EVAL: Pt arrives on this date for  balance issues that have been ongoing for several years - per her report (corroborated by friend/aide) these issues have actually improved recently, but notes most difficulty as the day goes on with increased fatigue. On exam, pt demonstrates difficulty with dual tasking and challenges during DGI testing. TUG score is indicative of fall risk, as is DGI. In regards to gait impairments as above, pt endorses neurological history (stroke, brain tumor with surgery); unsure if these impairments are attributable to these past conditions as pt states she is no longer following with neurology, although she notes these impairments have been present for at least 10 years (corroborated by friend/aide) and actually seem to be improving. No overt focal weakness, but functional weakness with transfers noted. Pt tolerates exam well, no adverse events. Recommend skilled PT to address aforementioned deficits to improve functional independence/tolerance and reduce fall risk. Pt departs today's session in no acute distress, all voiced questions/concerns addressed appropriately from PT perspective.    OBJECTIVE IMPAIRMENTS: Abnormal gait, decreased activity tolerance, decreased balance, decreased endurance, difficulty walking, improper body mechanics, postural dysfunction, and pain.   ACTIVITY LIMITATIONS: standing, stairs, transfers, bathing, and locomotion level  PARTICIPATION LIMITATIONS: meal prep, cleaning, laundry, driving, and community activity  PERSONAL FACTORS: Age, Time since onset of injury/illness/exacerbation, and 3+ comorbidities: anxiety/depression, HTN, osteopenia, cancer hx, DM2, hx stroke  are also affecting patient's functional outcome.   REHAB POTENTIAL: Fair given chronicity and comorbidities  CLINICAL DECISION MAKING: Evolving/moderate complexity  EVALUATION COMPLEXITY: Moderate   GOALS: Goals reviewed with patient? No  SHORT TERM GOALS: Target date: 01/08/2023 Pt will demonstrate appropriate  understanding and performance of initially prescribed HEP in order to facilitate improved independence with management of symptoms.  Baseline: HEP TBD 01/10/23: reports fair compliance with HEP  Goal status: MET   2. Pt will improve at least MCID on FOTO in order to demonstrate improved perception of function due to symptoms.  Baseline: FOTO TBD  01/15/23: 50  Goal status: not met Goal no longer due to FOTO not being accurate.   LONG TERM GOALS: Target date: updated to 04/02/2023   Pt will meet predicted score on FOTO in order to demonstrate improved perception of functional status due to symptoms.  Baseline: FOTO TBD Goal status: not met Goal no longer due to FOTO not being accurate.   2. Pt will be able to perform TUG in less than or equal to 15sec sec in order to indicate reduced risk of falling (cutoff score for fall risk 13.5 sec in community dwelling older adults per [facility] et al, 2000)  Baseline: 19sec  Status: 18 seconds  Goal status: progressing  02/05/2023   3.  Pt will perform Dynamic Gait Index with score greater than or equal to 17/24 in order to indicate reduced fall risk (<19/24 predictive of falls in elderly per [facility] et al 1995, MCID 1.9 per Pardasaney et al 2012)  Baseline: 12/24  Status:14/24  Goal status:  progressing  02/05/2023    4. Pt will perform 5xSTS in <12 sec and mechanics grossly WNL in order to demonstrate reduced fall risk and improved functional independence. (MCID of 2.3sec) Baseline: 12 sec w/o UE support, altered mechanics w/ noted retropulsion and mild shift towards LLE  Status: 13 with SPC   Goal status:  progressing  02/05/2023  PLAN:  PT FREQUENCY: 1 - 2 x a week  PT DURATION: 8 weeks  PLANNED INTERVENTIONS: Therapeutic exercises, Therapeutic activity, Neuromuscular re-education, Balance training, Gait training, Patient/Family education, Self Care, Stair training, Vestibular training, Cryotherapy, Taping, Manual therapy, and  Re-evaluation.  PLAN FOR NEXT SESSION: review/update HEP PRN. Gait training, gross LE strengthening with emphasis on endurance. Progress balance training.    Ashley Murrain PT, DPT 02/14/2023 12:40 PM

## 2023-02-15 NOTE — Therapy (Signed)
OUTPATIENT PHYSICAL THERAPY TREATMENT       Patient Name: [name] MRN: [id number] DOB:[birth year], 82 y.o., female Today's Date: 02/19/2023  END OF SESSION:  PT End of Session - 02/19/23 1152     Visit Number 11    Number of Visits 17    Date for PT Re-Evaluation 04/02/23    Progress Note Due on Visit 19    PT Start Time 1153   late check in   PT Stop Time 1230    PT Time Calculation (min) 37 min    Activity Tolerance Patient tolerated treatment well    Behavior During Therapy [facility] for tasks assessed/performed                     Past Medical History:  Diagnosis Date   Anemia    Anxiety    Arthritis    Bilateral carotid artery disease (HCC) 02/04/2017   Carotid US 3/22: R 1-39; L 100   Depression, recurrent (HCC) 02/10/2019   Hypertension    Hypothyroidism    Impaired functional mobility, balance, gait, and endurance 09/02/2015   Malignant neoplasm of lower-outer quadrant of left breast of female, estrogen receptor positive (HCC) 04/24/2021   Mild cognitive impairment with memory loss 09/02/2015   Osteopenia    Pre-diabetes    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 Clifton James & Dr. Laneta Simmers   Severe aortic stenosis    Stroke [facility])    pt was 76   Past Surgical History:  Procedure Laterality Date   ABDOMINAL HYSTERECTOMY     BACK SURGERY     2019   BRAIN SURGERY     age 46's- steel plate in back of head   BREAST LUMPECTOMY WITH RADIOACTIVE SEED AND SENTINEL LYMPH NODE BIOPSY Left 05/17/2021   Procedure: LEFT BREAST SEED LOCALIZED LUMPECTOMY (BRACKETED) WITH SENTINEL LYMPH NODE BIOPSY;  Surgeon: Harriette Bouillon, MD;  Location: [facility] OR;  Service: General;  Laterality: Left;  PEC BLOCK 90 MINUTES ROOM 9   CATARACT EXTRACTION Bilateral    INTRAOPERATIVE TRANSTHORACIC ECHOCARDIOGRAM N/A 06/26/2022   Procedure: INTRAOPERATIVE TRANSTHORACIC ECHOCARDIOGRAM;  Surgeon:  Kathleene Hazel, MD;  Location: [facility] OR;  Service: Open Heart Surgery;  Laterality: N/A;   RE-EXCISION OF BREAST LUMPECTOMY Left 06/08/2021   Procedure: RE-EXCISION OF LEFT BREAST LUMPECTOMY;  Surgeon: Harriette Bouillon, MD;  Location: [facility] ORS;  Service: General;  Laterality: Left;   RIGHT HEART CATH AND CORONARY ANGIOGRAPHY N/A 05/17/2022   Procedure: RIGHT HEART CATH AND CORONARY ANGIOGRAPHY;  Surgeon: Kathleene Hazel, MD;  Location: [facility] INVASIVE CV LAB;  Service: Cardiovascular;  Laterality: N/A;   TOE AMPUTATION  2013   2nd toe on right foot, hammer toe   TONSILLECTOMY     TOOTH EXTRACTION     all teeth removed   TOTAL HIP ARTHROPLASTY Left 03/19/2017   Procedure: LEFT TOTAL HIP ARTHROPLASTY ANTERIOR APPROACH;  Surgeon: Kathryne Hitch, MD;  Location: [facility] OR;  Service: Orthopedics;  Laterality: Left;   TRANSCATHETER AORTIC VALVE REPLACEMENT, TRANSFEMORAL N/A 06/26/2022   Procedure: TRANSCATHETR AORTIC VALVE REPLACEMENT, TRANSFEMORAL;  Surgeon: Kathleene Hazel, MD;  Location: [facility] OR;  Service: Open Heart Surgery;  Laterality: N/A;   ULTRASOUND GUIDANCE FOR VASCULAR ACCESS Bilateral 06/26/2022   Procedure: ULTRASOUND GUIDANCE FOR VASCULAR ACCESS;  Surgeon: Kathleene Hazel, MD;  Location: [facility] OR;  Service: Open Heart Surgery;  Laterality: Bilateral;  Patient Active Problem List   Diagnosis Date Noted   SVT (supraventricular tachycardia) 12/17/2022   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   Aneurysm of thoracic aorta (HCC) 06/04/2022   Pulmonary nodule 06/04/2022   Thyroid nodule 06/04/2022   Malignant neoplasm of lower-outer quadrant of left breast of female, estrogen receptor positive (HCC) 04/24/2021   Controlled type 2 diabetes mellitus with chronic kidney disease, without long-term current use of insulin (HCC) 11/27/2019   Memory impairment 08/31/2019   Depression, recurrent (HCC)  02/10/2019   Degenerative spondylolisthesis 06/06/2018   Sensorineural hearing loss (SNHL), 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   Bilateral carotid artery disease (HCC) 02/04/2017   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   Osteopenia 08/30/2014   Severe aortic stenosis 12/12/2009   Hypothyroidism 07/12/2009   INSOMNIA, PERSISTENT 07/12/2009   Essential hypertension 07/12/2009    PCP: No PCP in chart  REFERRING PROVIDER: Edwin Cap, DPM  REFERRING DIAG: G62.89 (ICD-10-CM) - Other polyneuropathy R26.89 (ICD-10-CM) - Balance disorder  Rationale for Evaluation and Treatment: Rehabilitation  THERAPY DIAG:  Unsteadiness on feet  Muscle weakness (generalized)  ONSET DATE: several years  SUBJECTIVE:                                                                                                                                                                                           SUBJECTIVE STATEMENT: 02/19/2023 Pt states she is feeling okay today, still having a bit of soreness here and there. No new falls.   EVAL: Balance is worse in evenings, occasional steadying assist from aide and SPC use for community distances. More difficulty with uneven surfaces (yard). Difficulty with stairs, needs bannister support States she occasionally gets dizzy, also at night, states she has spoken with provider about it and has improved with medication changes. Does endorse tingling in her feet and hands which she attributes to neuropathy and has been ongoing for years. States balance actually seems to be improving recently, has had issues for several years.  Today's observed gait mechanics have been present for at least 10 years per her aide, also describes some previous freezing behavior (several years ago) but this has reportedly resolved  PERTINENT HISTORY:  anxiety/depression, anemia, HTN, hx cancer, osteopenia, s/p TAVR Jan 2024, hx stroke, carotid artery disease, DM2 w/ CKD, HOH, hx brain tumor with surgery    PAIN:  No pain   PRECAUTIONS: fall risk, hx cancer, cardiovascular hx, neurological hx, HOH  WEIGHT BEARING RESTRICTIONS: No  FALLS:  Has patient fallen in last 6 months? No (had some falls last year)   LIVING ENVIRONMENT: 1 story home, 4STE  no rails Lives alone, has an aide Mon-Fri 1130-1330 Has help with housework, medication (pillbox), meal prep  Has an SPC (most used), QC, RW,  No grab bars    OCCUPATION: retired Chartered loss adjuster  PLOF: assistance w/ ADLs/housework  PATIENT GOALS: improve balance  NEXT MD VISIT: sees PCP every 3 months  OBJECTIVE: (objective measures completed at initial evaluation unless otherwise dated)   DIAGNOSTIC FINDINGS:  No recent imaging pertaining to this episode   PATIENT SURVEYS:  FOTO deferred on eval given time constraints 01/04/23: Deferred- system down 01/15/23: 50 current, 50 predicted 02/05/23 noted 38% compared to initial assessment of 50% which is likely result of error.   COGNITION: Overall cognitive status: Within functional limits for tasks assessed - confounded by hearing impairments, she does endorse some memory issues, aide/friend corroborates/assists with history at times   SENSATION: Light touch intact B UE and LE  Mild incoordination B UE, R seems more affected than L (pt attributes this to prior stroke) Does have lateral deviation of R eye, states this has been present since at least adolescence   POSTURE: mild lean to L, increased kyphosis, increased postural sway in standing (tendency towards posterior)  PALPATION: Not indicated   RANGE OF MOTION:     Active  Right eval Left eval  Shoulder flexion Reduced with compensatory trunk lean to L [facility]  Functional ER combo    Functional IR combo    Knee extension    Ankle dorsiflexion     (Blank rows = not tested) (Key: WFL = within functional limits not formally assessed, * = concordant pain, s = stiffness/stretching sensation, NT = not tested)   Comments:    STRENGTH TESTING:  MMT Right eval Left eval  Shoulder flexion    Shoulder abduction    Elbow flexion    Elbow extension    Grip strength (gross)    Hip flexion    Hip abduction (modified sitting) 5 5  Knee flexion 4+ 4+  Knee extension 4+ 4+  Ankle dorsiflexion    Ankle plantarflexion     (Blank rows = not tested) (Key: WFL = within functional limits not formally assessed, * = concordant pain, s = stiffness/stretching sensation, NT = not tested)  Comments:     FUNCTIONAL TESTS:  5 times sit to stand: 12.83 sec, no UE support but noted retropulsion, difficulty with forward trunk lean  02/05/2023 - 13 seconds (slight pause during rest requiring min cues to continue) with use of SPC   TUG: 19 sec, no AD, UE support from chair 02/05/23 Trial 1 - 19 seconds (with AD), Trial 2- 17 seconds AVG 18 seconds  01/04/23: SLS 5 sec each; Tandem stance 10 sec each  01/15/23: TUG 20sec w/ SPC UE support from chair  02/05/23   GAIT: Distance walked: within clinic Assistive device utilized: None Level of assistance: Complete Independence Comments: wide BOS, shuffling steps, tendency for reduced trunk/rotation and arm swing    Dynamic Gait Index:   1. Gait Level Surface; 2  2. Change in Gait Speed; 1  3. Gait With Horizontal Head Turns;2  4. Gait With Vertical Head Turns; 2  5. Gait and Pivot Turn; 1  6. Step Over Obstacle; 1  7. Step Around Obstacles; 2  8. Steps; 1   Grading scale: 3 No impairment; 2 Mild impairment/AD use; 1 Moderate impairment; 0 Severe impairment or unable to perform   Total score: 12/24  Cutoff score for fall risk: </=19/24   02/05/2023 DGI:  1. Gait Level Surface; 2  2. Change in Gait Speed; 2  3. Gait With Horizontal Head Turns;1  4. Gait With Vertical Head Turns; 2  5. Gait and Pivot Turn; 2  6. Step Over Obstacle; 2  7. Step Around Obstacles; 2  8. Steps; 1  Total score 14/24  TODAY'S TREATMENT:                                                                                                                               [facility] Adult PT Treatment:                                                DATE: 02/19/23 Therapeutic Exercise: STS from slightly raised mat 2x10 cues for fwd trunk lean  Seated on dynadisc fwd reaches <90 deg 2x10   Therapeutic Activity: CGA throughout SPC in either UE, 145ft each, cues for sequencing and mechanics for stability Obstacle course navigation SPC 3 laps (airex, pool noodle step over, 3 cone weaving) cues for pacing, sequencing, seated rest breaks between Education on appropriate AD use and strategies to improve safety w/ mobility    [facility] Adult PT Treatment:                                                DATE: 02/14/23 Therapeutic Exercise: Standing hurdle taps 2x10 BIL at counter cues for posture Cone lateral step overs 3x5 BIL LE with counter support TUG blocked practice x4 for muscular endurance cues for pacing and at least rest between, cues for velocity, CGA HEP update + education/handout  Neuromuscular re-ed: Gait 4 bouts 33ft with cues for reducing # of steps each bout to increase amplitude of movement, CGA for safety w/ SPC (20 steps, 21 steps, 18 steps, 19 steps) STS blocked practice 3x6 with cross body reach (<90 deg shoulder elevation for comfort) for truncal dissociation and core activation during transfers    [facility] Adult PT Treatment:                                                DATE: 02/05/2023  Therapeutic Activity: TUG assessment avg 18 seconds 5 x sit to stand 13 seconds DGI assessment 14/24 Reviewed progress based on testing and how it related to her function and stability.    [facility] Adult PT Treatment:  DATE: 02/04/2023  Neuromuscular re-ed: Stepping over 4 hurdles and stepping on to blue airex pad holding x 30 seconds then turning around on blue airex pad and returning to start x 3. In // with SPC in LUE Toe taps on  hurdle 3 x 12  in // with SPC in LUE Gait training in // with SPC heel strike/ toe of x 4 sets, then progresed outside of // 4 x 25 ft, and finished session walking out of gym x 100 ft   [facility] Adult PT Treatment:                                                DATE: 01/30/23 Neuromuscular re-ed: Eyes closed static stance 2x30sec close CGA  Eyes closed static stance w/ dual tasking (math problems) 1 bout to fatigue, CGA with intermittent minA to mitigate LOB Fwd weight shifting, standing behind yellow line  x12 each UE, cues for truncal dissociation, CGA for safety Cone taps 2x8 BIL at counter cues for pacing and posture Therapeutic Activity: 2x8 STS w/ SPC in front  4 inch step up 2x8 BIL with treadmill UE support cues for pacing and step length  PATIENT EDUCATION:  Education details: rationale for interventions Person educated: Patient and friend/aide Education method: Explanation, Demonstration, Tactile cues, Verbal cues Education comprehension: verbalized understanding, returned demonstration, verbal cues required, tactile cues required, and needs further education    HOME EXERCISE PROGRAM: Access Code: [email or web address] URL: [email or web address] Date: 02/14/2023 Prepared by: Fransisco Hertz  Exercises - Seated Gluteal Sets  - 1 x daily - 7 x weekly - 2 sets - 10 reps - Seated Long Arc Quad  - 1 x daily - 7 x weekly - 3 sets - 10 reps - 3 seconds hold - Seated Transversus Abdominis Bracing  - 2-3 x daily - 7 x weekly - 2 sets - 10 reps - 10 seconds hold - Sit to Stand Without Arm Support  - 1 x daily - 7 x weekly - 2 sets - 10 reps - Seated Hip Abduction with Resistance  - 1 x daily - 7 x weekly - 3 sets - 10 - 15 reps - Standing March with Counter Support  - 1 x daily - 7 x weekly - 2 sets - 10 reps  ASSESSMENT:  CLINICAL IMPRESSION: 02/19/2023 Pt arrives w/o pain, continues to note a bit of soreness here and there. Continues to endorse improvements in functional mobility and  balance. Today we continue to focus on strengthening and mechanics with sit to stands, incorporation of fwd reaches seated on dynadisc for increased challenge to core musculature. Pt has initial difficulty with obstacle course but improves with repetition and cues for sequencing/navigation. Tolerates session well with report of muscular fatigue, no pain or adverse events, CGA throughout with one instance of minA during obstacle course. Recommend continuing along current POC in order to address relevant deficits and improve functional tolerance. Pt departs today's session in no acute distress, all voiced questions/concerns addressed appropriately from PT perspective.     EVAL: Pt arrives on this date for balance issues that have been ongoing for several years - per her report (corroborated by friend/aide) these issues have actually improved recently, but notes most difficulty as the day goes on with increased fatigue. On exam, pt demonstrates difficulty with dual tasking and challenges during DGI testing. TUG score  is indicative of fall risk, as is DGI. In regards to gait impairments as above, pt endorses neurological history (stroke, brain tumor with surgery); unsure if these impairments are attributable to these past conditions as pt states she is no longer following with neurology, although she notes these impairments have been present for at least 10 years (corroborated by friend/aide) and actually seem to be improving. No overt focal weakness, but functional weakness with transfers noted. Pt tolerates exam well, no adverse events. Recommend skilled PT to address aforementioned deficits to improve functional independence/tolerance and reduce fall risk. Pt departs today's session in no acute distress, all voiced questions/concerns addressed appropriately from PT perspective.    OBJECTIVE IMPAIRMENTS: Abnormal gait, decreased activity tolerance, decreased balance, decreased endurance, difficulty walking,  improper body mechanics, postural dysfunction, and pain.   ACTIVITY LIMITATIONS: standing, stairs, transfers, bathing, and locomotion level  PARTICIPATION LIMITATIONS: meal prep, cleaning, laundry, driving, and community activity  PERSONAL FACTORS: Age, Time since onset of injury/illness/exacerbation, and 3+ comorbidities: anxiety/depression, HTN, osteopenia, cancer hx, DM2, hx stroke  are also affecting patient's functional outcome.   REHAB POTENTIAL: Fair given chronicity and comorbidities  CLINICAL DECISION MAKING: Evolving/moderate complexity  EVALUATION COMPLEXITY: Moderate   GOALS: Goals reviewed with patient? No  SHORT TERM GOALS: Target date: 01/08/2023 Pt will demonstrate appropriate understanding and performance of initially prescribed HEP in order to facilitate improved independence with management of symptoms.  Baseline: HEP TBD 01/10/23: reports fair compliance with HEP  Goal status: MET   2. Pt will improve at least MCID on FOTO in order to demonstrate improved perception of function due to symptoms.  Baseline: FOTO TBD  01/15/23: 50  Goal status: not met Goal no longer due to FOTO not being accurate.   LONG TERM GOALS: Target date: updated to 04/02/2023   Pt will meet predicted score on FOTO in order to demonstrate improved perception of functional status due to symptoms.  Baseline: FOTO TBD Goal status: not met Goal no longer due to FOTO not being accurate.   2. Pt will be able to perform TUG in less than or equal to 15sec sec in order to indicate reduced risk of falling (cutoff score for fall risk 13.5 sec in community dwelling older adults per [facility] et al, 2000)  Baseline: 19sec  Status: 18 seconds  Goal status: progressing  02/05/2023   3.  Pt will perform Dynamic Gait Index with score greater than or equal to 17/24 in order to indicate reduced fall risk (<19/24 predictive of falls in elderly per [facility] et al 1995, MCID 1.9 per Pardasaney et al  2012)  Baseline: 12/24  Status:14/24  Goal status:  progressing  02/05/2023    4. Pt will perform 5xSTS in <12 sec and mechanics grossly WNL in order to demonstrate reduced fall risk and improved functional independence. (MCID of 2.3sec) Baseline: 12 sec w/o UE support, altered mechanics w/ noted retropulsion and mild shift towards LLE  Status: 13 with SPC   Goal status:  progressing  02/05/2023  PLAN:  PT FREQUENCY: 1 - 2 x a week  PT DURATION: 8 weeks  PLANNED INTERVENTIONS: Therapeutic exercises, Therapeutic activity, Neuromuscular re-education, Balance training, Gait training, Patient/Family education, Self Care, Stair training, Vestibular training, Cryotherapy, Taping, Manual therapy, and Re-evaluation.  PLAN FOR NEXT SESSION: review/update HEP PRN. Gait training, gross LE strengthening with emphasis on endurance. Progress balance training.    Ashley Murrain PT, DPT 02/19/2023 12:37 PM

## 2023-02-19 ENCOUNTER — Encounter: Payer: Self-pay | Admitting: Physical Therapy

## 2023-02-19 ENCOUNTER — Ambulatory Visit: Payer: Medicare HMO | Attending: Podiatry | Admitting: Physical Therapy

## 2023-02-19 DIAGNOSIS — M6281 Muscle weakness (generalized): Secondary | ICD-10-CM | POA: Insufficient documentation

## 2023-02-19 DIAGNOSIS — R2681 Unsteadiness on feet: Secondary | ICD-10-CM | POA: Insufficient documentation

## 2023-02-26 ENCOUNTER — Ambulatory Visit: Payer: Medicare HMO | Admitting: Physical Therapy

## 2023-02-26 ENCOUNTER — Encounter: Payer: Self-pay | Admitting: Physical Therapy

## 2023-02-26 DIAGNOSIS — R2681 Unsteadiness on feet: Secondary | ICD-10-CM | POA: Diagnosis not present

## 2023-02-26 DIAGNOSIS — M6281 Muscle weakness (generalized): Secondary | ICD-10-CM

## 2023-02-26 NOTE — Therapy (Signed)
OUTPATIENT PHYSICAL THERAPY TREATMENT + DISCHARGE      Patient Name: Yvonne [name] MRN: [id number] DOB:[birth year], 82 y.o., female Today's Date: 02/26/2023  PHYSICAL THERAPY DISCHARGE SUMMARY  Visits from Start of Care: 12  Current functional level related to goals / functional outcomes: Requiring assistive device, participating more fully in community activities, no recent falls   Remaining deficits: Postural stability deficits, weakness, fall risk   Education / Equipment: HEP, safety w/ activity, discharge education, follow up with provider   Patient agrees to discharge. Patient goals were not met. Patient is being discharged due to being pleased with the current functional level, plateau in progress with functional outcome measures.    END OF SESSION:  PT End of Session - 02/26/23 1107     Visit Number 12    Number of Visits 17    Date for PT Re-Evaluation 04/02/23    Authorization Type --    PT Start Time 1108   late check in   PT Stop Time 1145    PT Time Calculation (min) 37 min    Activity Tolerance Patient tolerated treatment well    Behavior During Therapy WFL for tasks assessed/performed              Past Medical History:  Diagnosis Date   Anemia    Anxiety    Arthritis    Bilateral carotid artery disease (HCC) 02/04/2017   Carotid US 3/22: R 1-39; L 100   Depression, recurrent (HCC) 02/10/2019   Hypertension    Hypothyroidism    Impaired functional mobility, balance, gait, and endurance 09/02/2015   Malignant neoplasm of lower-outer quadrant of left breast of female, estrogen receptor positive (HCC) 04/24/2021   Mild cognitive impairment with memory loss 09/02/2015   Osteopenia    Pre-diabetes    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 Clifton James & Dr. Laneta Simmers   Severe aortic stenosis    Stroke [facility])    pt was 56   Past Surgical History:  Procedure  Laterality Date   ABDOMINAL HYSTERECTOMY     BACK SURGERY     2019   BRAIN SURGERY     age 33's- steel plate in back of head   BREAST LUMPECTOMY WITH RADIOACTIVE SEED AND SENTINEL LYMPH NODE BIOPSY Left 05/17/2021   Procedure: LEFT BREAST SEED LOCALIZED LUMPECTOMY (BRACKETED) WITH SENTINEL LYMPH NODE BIOPSY;  Surgeon: Harriette Bouillon, MD;  Location: [facility] OR;  Service: General;  Laterality: Left;  PEC BLOCK 90 MINUTES ROOM 9   CATARACT EXTRACTION Bilateral    INTRAOPERATIVE TRANSTHORACIC ECHOCARDIOGRAM N/A 06/26/2022   Procedure: INTRAOPERATIVE TRANSTHORACIC ECHOCARDIOGRAM;  Surgeon: Kathleene Hazel, MD;  Location: [facility] OR;  Service: Open Heart Surgery;  Laterality: N/A;   RE-EXCISION OF BREAST LUMPECTOMY Left 06/08/2021   Procedure: RE-EXCISION OF LEFT BREAST LUMPECTOMY;  Surgeon: Harriette Bouillon, MD;  Location: [facility] ORS;  Service: General;  Laterality: Left;   RIGHT HEART CATH AND CORONARY ANGIOGRAPHY N/A 05/17/2022   Procedure: RIGHT HEART CATH AND CORONARY ANGIOGRAPHY;  Surgeon: Kathleene Hazel, MD;  Location: [facility] INVASIVE CV LAB;  Service: Cardiovascular;  Laterality: N/A;   TOE AMPUTATION  2013   2nd toe on right foot, hammer toe   TONSILLECTOMY     TOOTH EXTRACTION     all teeth removed   TOTAL HIP ARTHROPLASTY Left 03/19/2017   Procedure: LEFT TOTAL HIP ARTHROPLASTY ANTERIOR APPROACH;  Surgeon: Magnus Ivan,  Vanita Panda, MD;  Location: [facility] OR;  Service: Orthopedics;  Laterality: Left;   TRANSCATHETER AORTIC VALVE REPLACEMENT, TRANSFEMORAL N/A 06/26/2022   Procedure: TRANSCATHETR AORTIC VALVE REPLACEMENT, TRANSFEMORAL;  Surgeon: Kathleene Hazel, MD;  Location: [facility] OR;  Service: Open Heart Surgery;  Laterality: N/A;   ULTRASOUND GUIDANCE FOR VASCULAR ACCESS Bilateral 06/26/2022   Procedure: ULTRASOUND GUIDANCE FOR VASCULAR ACCESS;  Surgeon: Kathleene Hazel, MD;  Location: [facility] OR;  Service: Open Heart Surgery;  Laterality: Bilateral;   Patient Active Problem List    Diagnosis Date Noted   SVT (supraventricular tachycardia) 12/17/2022   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   Aneurysm of thoracic aorta (HCC) 06/04/2022   Pulmonary nodule 06/04/2022   Thyroid nodule 06/04/2022   Malignant neoplasm of lower-outer quadrant of left breast of female, estrogen receptor positive (HCC) 04/24/2021   Controlled type 2 diabetes mellitus with chronic kidney disease, without long-term current use of insulin (HCC) 11/27/2019   Memory impairment 08/31/2019   Depression, recurrent (HCC) 02/10/2019   Degenerative spondylolisthesis 06/06/2018   Sensorineural hearing loss (SNHL), 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   Bilateral carotid artery disease (HCC) 02/04/2017   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   Osteopenia 08/30/2014   Severe aortic stenosis 12/12/2009   Hypothyroidism 07/12/2009   INSOMNIA, PERSISTENT 07/12/2009   Essential hypertension 07/12/2009    PCP: No PCP in chart  REFERRING PROVIDER: Edwin Cap, DPM  REFERRING DIAG: G62.89 (ICD-10-CM) - Other polyneuropathy R26.89 (ICD-10-CM) - Balance disorder  Rationale for Evaluation and Treatment: Rehabilitation  THERAPY DIAG:  Unsteadiness on feet  Muscle weakness (generalized)  ONSET DATE: several years  SUBJECTIVE:                                                                                                                                                                                           SUBJECTIVE STATEMENT: 02/26/2023 Pt states she continues to feel some improvements in balance. Still has some more trouble in the evenings but better than it was. States she feels comfortable discharging at this time - has not had any recent falls or near falls, is participating more in church activities.   EVAL: Balance is worse in evenings, occasional steadying assist from aide and SPC use for community distances. More difficulty with uneven surfaces  (yard). Difficulty with stairs, needs bannister support States she occasionally gets dizzy, also at night, states she has spoken with provider about it and has improved with medication changes. Does endorse tingling in her feet and hands which she attributes to neuropathy and has been ongoing for years. States balance actually seems to be  improving recently, has had issues for several years.  Today's observed gait mechanics have been present for at least 10 years per her aide, also describes some previous freezing behavior (several years ago) but this has reportedly resolved  PERTINENT HISTORY:  anxiety/depression, anemia, HTN, hx cancer, osteopenia, s/p TAVR Jan 2024, hx stroke, carotid artery disease, DM2 w/ CKD, HOH, hx brain tumor with surgery   PAIN:  No pain   PRECAUTIONS: fall risk, hx cancer, cardiovascular hx, neurological hx, HOH  WEIGHT BEARING RESTRICTIONS: No  FALLS:  Has patient fallen in last 6 months? 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  PLOF: assistance w/ ADLs/housework  PATIENT GOALS: improve balance  NEXT MD VISIT: sees PCP every 3 months  OBJECTIVE: (objective measures completed at initial evaluation unless otherwise dated)   DIAGNOSTIC FINDINGS:  No recent imaging pertaining to this episode   PATIENT SURVEYS:  FOTO deferred on eval given time constraints 01/04/23: Deferred- system down 01/15/23: 50 current, 50 predicted 02/05/23 noted 38% compared to initial assessment of 50% which is likely result of error.   COGNITION: Overall cognitive status: Within functional limits for tasks assessed - confounded by hearing impairments, she does endorse some memory issues, aide/friend corroborates/assists with history at  times   SENSATION: Light touch intact B UE and LE  Mild incoordination B UE, R seems more affected than L (pt attributes this to prior stroke) Does have lateral deviation of R eye, states this has been present since at least adolescence   POSTURE: mild lean to L, increased kyphosis, increased postural sway in standing (tendency towards posterior)  PALPATION: Not indicated   RANGE OF MOTION:     Active  Right eval Left eval  Shoulder flexion Reduced with compensatory trunk lean to L [facility]  Functional ER combo    Functional IR combo    Knee extension    Ankle dorsiflexion     (Blank rows = not tested) (Key: WFL = within functional limits not formally assessed, * = concordant pain, s = stiffness/stretching sensation, NT = not tested)  Comments:    STRENGTH TESTING:  MMT Right eval Left eval  Shoulder flexion    Shoulder abduction    Elbow flexion    Elbow extension    Grip strength (gross)    Hip flexion    Hip abduction (modified sitting) 5 5  Knee flexion 4+ 4+  Knee extension 4+ 4+  Ankle dorsiflexion    Ankle plantarflexion     (Blank rows = not tested) (Key: WFL = within functional limits not formally assessed, * = concordant pain, s = stiffness/stretching sensation, NT = not tested)  Comments:     FUNCTIONAL TESTS:  5 times sit to stand: 12.83 sec, no UE support but noted retropulsion, difficulty with forward trunk lean  02/05/2023 - 13 seconds (slight pause during rest requiring min cues to continue) with use of SPC   TUG: 19 sec, no AD, UE support from chair 02/05/23 Trial 1 - 19 seconds (with AD), Trial 2- 17 seconds AVG 18 seconds  01/04/23: SLS 5 sec each; Tandem stance 10 sec each  01/15/23: TUG 20sec w/ SPC UE support from chair  02/1023 - 5xSTS 14.37sec no UE support, no SPC  -  TUG 15 sec no AD, 14 sec with SPC    GAIT: Distance walked: within clinic Assistive device utilized: None Level of assistance: Complete Independence Comments:  wide BOS, shuffling steps, tendency for reduced trunk/rotation and arm swing    Dynamic Gait Index:   1. Gait Level Surface; 2  2. Change in Gait Speed; 1  3. Gait With Horizontal Head Turns;2  4. Gait With Vertical Head Turns; 2  5. Gait and Pivot Turn; 1  6. Step Over Obstacle; 1  7. Step Around Obstacles; 2  8. Steps; 1   Grading scale: 3 No impairment; 2 Mild impairment/AD use; 1 Moderate impairment; 0 Severe impairment or unable to perform   Total score: 12/24  Cutoff score for fall risk: </=19/24   02/05/2023 DGI: 1. Gait Level Surface; 2  2. Change in Gait Speed; 2  3. Gait With Horizontal Head Turns;1  4. Gait With Vertical Head Turns; 2  5. Gait and Pivot Turn; 2  6. Step Over Obstacle; 2  7. Step Around Obstacles; 2  8. Steps; 1  Total score 14/24   02/26/23  DGI: 1. Gait Level Surface; 2  2. Change in Gait Speed; 2  3. Gait With Horizontal Head Turns; 2  4. Gait With Vertical Head Turns; 2  5. Gait and Pivot Turn; 2   6. Step Over Obstacle; 1  7. Step Around Obstacles; 2   8. Steps; 1  Total score 14/24     TODAY'S TREATMENT:                                                                                                                              [facility] Adult PT Treatment:                                                DATE: 02/26/23 Therapeutic Exercise: HEP review/practice w/ demonstrative reps and education on appropriate/safe performance  Therapeutic Activity: TUG + education 5xSTS + education DGI + education Education/discussion re: progress with PT, symptom behavior as it affects activity tolerance, PT goals/POC, discharge education, follow up with provider    PATIENT EDUCATION:  Education details: rationale for interventions, HEP, progress with PT thus far, PT goals/POC Person educated: Patient and friend/aide Education method: Explanation, Demonstration, Tactile cues, Verbal cues Education comprehension: verbalized understanding,  returned demonstration, verbal cues required, tactile cues required, and needs further education    HOME EXERCISE PROGRAM: Access Code: [email or web address] URL: [email or web address] Date: 02/26/2023 Prepared by: Fransisco Hertz  Exercises - Seated Long Arc Quad  - 1 x daily - 7 x weekly - 3 sets - 10 reps - 3 seconds hold - Sit to Stand Without Arm Support  - 1 x daily - 7 x weekly - 2 sets - 10 reps - Seated Hip Abduction with Resistance  -  1 x daily - 7 x weekly - 3 sets - 10 - 15 reps - Standing March with Counter Support  - 1 x daily - 7 x weekly - 2 sets - 10 reps  ASSESSMENT:  CLINICAL IMPRESSION: 02/26/2023 Pt arrives w/o pain, reports soreness in keeping with usual. Looking at goals today, pt has made fair progress with TUG (with and without AD), no significant changes in DGI or 5xSTS. She notes that she feels her balance has improved and she is now participating more in community activities, no recent falls or near falls. In discussion with pt, she states she feels confident discharging at this time as she is pleased with functional progress but has hit a plateau with 2/3 functional outcome measures. HEP reviewed with emphasis on safe/appropriate performance. No adverse events, recommend discharge to independent HEP at this time and follow up with provider as needed. All voiced questions/concerns addressed appropriately from PT perspective, pt departs session in no acute distress, verbalizes agreement/understanding with plan.     EVAL: Pt arrives on this date for balance issues that have been ongoing for several years - per her report (corroborated by friend/aide) these issues have actually improved recently, but notes most difficulty as the day goes on with increased fatigue. On exam, pt demonstrates difficulty with dual tasking and challenges during DGI testing. TUG score is indicative of fall risk, as is DGI. In regards to gait impairments as above, pt endorses neurological history  (stroke, brain tumor with surgery); unsure if these impairments are attributable to these past conditions as pt states she is no longer following with neurology, although she notes these impairments have been present for at least 10 years (corroborated by friend/aide) and actually seem to be improving. No overt focal weakness, but functional weakness with transfers noted. Pt tolerates exam well, no adverse events. Recommend skilled PT to address aforementioned deficits to improve functional independence/tolerance and reduce fall risk. Pt departs today's session in no acute distress, all voiced questions/concerns addressed appropriately from PT perspective.    OBJECTIVE IMPAIRMENTS: Abnormal gait, decreased activity tolerance, decreased balance, decreased endurance, difficulty walking, improper body mechanics, postural dysfunction, and pain.   ACTIVITY LIMITATIONS: standing, stairs, transfers, bathing, and locomotion level  PARTICIPATION LIMITATIONS: meal prep, cleaning, laundry, driving, and community activity  PERSONAL FACTORS: Age, Time since onset of injury/illness/exacerbation, and 3+ comorbidities: anxiety/depression, HTN, osteopenia, cancer hx, DM2, hx stroke  are also affecting patient's functional outcome.   REHAB POTENTIAL: Fair given chronicity and comorbidities  CLINICAL DECISION MAKING: Evolving/moderate complexity  EVALUATION COMPLEXITY: Moderate   GOALS: Goals reviewed with patient? No  SHORT TERM GOALS: Target date: 01/08/2023 Pt will demonstrate appropriate understanding and performance of initially prescribed HEP in order to facilitate improved independence with management of symptoms.  Baseline: HEP TBD 01/10/23: reports fair compliance with HEP  Goal status: MET   2. Pt will improve at least MCID on FOTO in order to demonstrate improved perception of function due to symptoms.  Baseline: FOTO TBD  01/15/23: 50  Goal status: not met Goal no longer due to FOTO not being  accurate.   LONG TERM GOALS: Target date: updated to 04/02/2023   Pt will meet predicted score on FOTO in order to demonstrate improved perception of functional status due to symptoms.  Baseline: FOTO TBD Goal status: not met Goal no longer due to FOTO not being accurate.   2. Pt will be able to perform TUG in less than or equal to 15sec sec in order  to indicate reduced risk of falling (cutoff score for fall risk 13.5 sec in community dwelling older adults per [facility] et al, 2000)  Baseline: 19sec  Status: 18 seconds  02/26/23: 14 sec   Goal status: NEARLY MET  3.  Pt will perform Dynamic Gait Index with score greater than or equal to 17/24 in order to indicate reduced fall risk (<19/24 predictive of falls in elderly per [facility] et al 1995, MCID 1.9 per Pardasaney et al 2012)  Baseline: 12/24  Status:14/24  02/26/23: 14/24   Goal status: NOT MET  4. Pt will perform 5xSTS in <12 sec and mechanics grossly WNL in order to demonstrate reduced fall risk and improved functional independence. (MCID of 2.3sec) Baseline: 12 sec w/o UE support, altered mechanics w/ noted retropulsion and mild shift towards LLE  Status: 13 with SPC   02/26/23: 14 sec no UE support   Goal status: NOT MET  PLAN: DISCHARGE 02/26/23  PT FREQUENCY: NA  PT DURATION: NA  PLANNED INTERVENTIONS: NA  PLAN FOR NEXT SESSION: discharge to independent HEP, follow up with provider as needed   Ashley Murrain PT, DPT 02/26/2023 1:13 PM

## 2023-02-27 NOTE — Progress Notes (Unsigned)
Patient Care Team: Pcp, No as PCP - General Nahser, Deloris Ping, MD as PCP - Cardiology (Cardiology) Linna Darner, RD as Dietitian (Family Medicine) Pershing Proud, RN as Oncology Nurse Navigator Donnelly Angelica, RN as Oncology Nurse Navigator Harriette Bouillon, MD as Consulting Physician (General Surgery) Malachy Mood, MD as Consulting Physician (Hematology) Lonie Peak, MD as Attending Physician (Radiation Oncology)   CHIEF COMPLAINT: Follow up left breast cancer   Oncology History Overview Note  Cancer Staging Malignant neoplasm of lower-outer quadrant of left breast of female, estrogen receptor positive (HCC) Staging form: Breast, AJCC 8th Edition - Clinical stage from 04/25/2021: Stage IIA (cT3, cN0, cM0, G1, ER+, PR+, HER2-) - Signed by Malachy Mood, MD on 04/26/2021    Malignant neoplasm of lower-outer quadrant of left breast of female, estrogen receptor positive (HCC)  04/05/2021 Mammogram   Exam: 3D Mammogram Diagnostic Bilateral Digital - Bilateral Breast Ultrasound - Limited Bilateral  IMPRESSION: The irregular mass in the left breast is highly suspicious of malignancy.   04/19/2021 Pathology Results   Diagnosis  Breast, left, needle core biopsy, mass 7x7x4cm [id number]  - INVASIVE MAMMARY CARCINOMA  - MAMMARY CARCINOMA IN SITU Microscopic Comment The biopsy material shows an infiltrative proliferation of cells arranged linearly and in small clusters. Based on the biopsy, the carcinoma appears Nottingham grade 1-2 pf 3 and measures 1.6 cm in greatest linear extent.  E-cadherin is POSITIVE supporting a ductal origin  PROGNOSTIC INDICATORS Results: The tumor cells are EQUIVOCAL for Her2 (2+). Her2 by FISH will be performed and results reported separately. Estrogen Receptor: 100%, POSITIVE, STRONG STAINING INTENSITY Progesterone Receptor: 100%, POSITIVE, STRONG STAINING INTENSITY Proliferation Marker Ki67: 5%  FLUORESCENCE IN-SITU HYBRIDIZATION Results: GROUP 5: HER2  **NEGATIVE**   04/24/2021 Initial Diagnosis   Malignant neoplasm of lower-outer quadrant of left breast of female, estrogen receptor positive (HCC)   04/25/2021 Cancer Staging   Staging form: Breast, AJCC 8th Edition - Clinical stage from 04/25/2021: Stage IIA (cT3, cN0, cM0, G1, ER+, PR+, HER2-) - Signed by Malachy Mood, MD on 04/26/2021 Stage prefix: Initial diagnosis Histologic grading system: 3 grade system   05/17/2021 Cancer Staging   Staging form: Breast, AJCC 8th Edition - Pathologic stage from 05/17/2021: Stage IB (pT3, pN0, cM0, G2, ER+, PR+, HER2-) - Signed by Malachy Mood, MD on 07/29/2021 Stage prefix: Initial diagnosis Multigene prognostic tests performed: None Histologic grading system: 3 grade system Residual tumor (R): R0 - None   05/17/2021 Definitive Surgery   FINAL MICROSCOPIC DIAGNOSIS:   A. 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  - Anterior margin free.  Tumor approaches to less than 0.1 cm of inked margin.  - Medial margin widely free of tumor.  - Posterior margin free.  Tumor approaches to 0.2 cm of inked margin.  - Ductal carcinoma in situ.   B. BREAST, LEFT ADDITIONAL LATERAL MARGIN, EXCISION:  - Invasive ductal carcinoma involving new lateral margin.  - Usual ductal hyperplasia and fibrocystic changes.  - Intraductal papilloma.  - Old fibroadenoma.   C. BREAST, LEFT ADDITIONAL SUPERIOR MARGIN, EXCISION:  - No carcinoma identified.  New superior margin free.  - Lobular neoplasia (atypical lobular hyperplasia).  - Complex sclerosing lesion.  - Fibrocystic changes and usual ductal hyperplasia.   D. BREAST, LEFT ADDITIONAL MEDIAL MARGIN, EXCISION:  - New medial margin free.  Invasive ductal carcinoma approaches to 0.8 cm of inked margin.  E. BREAST, LEFT ADDITIONAL INFERIOR MARGIN, EXCISION:  - Small fragment of tissue suspicious for malignancy, 0.2 cm  from inked margin.  - New inferior margin free.   F. BREAST, LEFT ADDITIONAL POSTERIOR MARGIN, EXCISION:  - New posterior margin free.  Invasive ductal carcinoma approaches to 0.2 cm of inked margin.   G. BREAST, LEFT ADDITIONAL ANTERIOR MARGIN, EXCISION:  - No malignancy identified.   H. LYMPH NODE, LEFT AXILLARY, SENTINEL, EXCISION:  - One lymph node, negative for carcinoma (0/1).   I. LYMPH NODE, LEFT AXILLARY, SENTINEL, EXCISION:  - One lymph node, negative for carcinoma (0/1).   J. LYMPH NODE, LEFT AXILLARY, SENTINEL, EXCISION:  - One lymph node, negative for carcinoma (0/1).   COMMENT:   The final (new) lateral resection margin is positive for carcinoma.  All other final (new) margins are negative.    07/03/2021 - 07/31/2021 Radiation Therapy   Site Technique Total Dose (Gy) Dose per Fx (Gy) Completed Fx Beam Energies  Breast, Left: Breast_L 3D 40.05/40.05 2.67 15/15 6XFFF, 10XFFF  Breast, Left: Breast_L_Bst 3D 10/10 2 5/5 6X, 10X         CURRENT THERAPY: Tamoxifen starting 09/01/21  INTERVAL HISTORY Yvonne [name] returns with her aide for follow up as scheduled. Last seen by me 08/30/22. Continues tamoxifen. Denies joint pain, hot flashes, denies breast changes or concerns. Mood is stable. Feels better with eating better and exercise. Just completed PT for balance which helped, denies fall. Mammogram due the end of this month at [facility] but is not scheduled. Her aide states effexor was not filled since last visit and she is not taking it.   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 Clifton James & Dr. Laneta Simmers   Severe aortic stenosis    Stroke [facility])    pt was 68     Past Surgical History:  Procedure Laterality Date   ABDOMINAL HYSTERECTOMY     BACK SURGERY     2019   BRAIN SURGERY     age 35's- steel plate in back of head   BREAST LUMPECTOMY WITH RADIOACTIVE SEED AND SENTINEL LYMPH NODE BIOPSY Left 05/17/2021   Procedure: LEFT BREAST SEED LOCALIZED LUMPECTOMY (BRACKETED) WITH SENTINEL LYMPH NODE BIOPSY;  Surgeon: Harriette Bouillon, MD;  Location: [facility] OR;  Service: General;  Laterality: Left;  PEC BLOCK 90 MINUTES ROOM 9   CATARACT EXTRACTION Bilateral    INTRAOPERATIVE TRANSTHORACIC ECHOCARDIOGRAM N/A 06/26/2022   Procedure: INTRAOPERATIVE TRANSTHORACIC ECHOCARDIOGRAM;  Surgeon: Kathleene Hazel, MD;  Location: [facility] OR;  Service: Open Heart Surgery;  Laterality: N/A;   RE-EXCISION OF BREAST LUMPECTOMY Left 06/08/2021   Procedure: RE-EXCISION OF LEFT BREAST LUMPECTOMY;  Surgeon: Harriette Bouillon, MD;  Location: [facility] ORS;  Service: General;  Laterality: Left;   RIGHT HEART CATH AND CORONARY ANGIOGRAPHY N/A  05/17/2022   Procedure: RIGHT HEART CATH AND CORONARY ANGIOGRAPHY;  Surgeon: Kathleene Hazel, MD;  Location: [facility] INVASIVE CV LAB;  Service: Cardiovascular;  Laterality: N/A;   TOE AMPUTATION  2013   2nd toe on right foot, hammer toe   TONSILLECTOMY     TOOTH EXTRACTION     all teeth removed   TOTAL HIP ARTHROPLASTY Left 03/19/2017   Procedure: LEFT TOTAL HIP ARTHROPLASTY ANTERIOR APPROACH;  Surgeon: Kathryne Hitch, MD;  Location: [facility] OR;  Service: Orthopedics;  Laterality: Left;   TRANSCATHETER AORTIC VALVE REPLACEMENT, TRANSFEMORAL N/A 06/26/2022   Procedure: TRANSCATHETR AORTIC VALVE REPLACEMENT,  TRANSFEMORAL;  Surgeon: Kathleene Hazel, MD;  Location: [facility] OR;  Service: Open Heart Surgery;  Laterality: N/A;   ULTRASOUND GUIDANCE FOR VASCULAR ACCESS Bilateral 06/26/2022   Procedure: ULTRASOUND GUIDANCE FOR VASCULAR ACCESS;  Surgeon: Kathleene Hazel, MD;  Location: [facility] OR;  Service: Open Heart Surgery;  Laterality: Bilateral;     Outpatient Encounter Medications as of 02/28/2023  Medication Sig Note   amitriptyline (ELAVIL) 50 MG tablet TAKE 1 TABLET AT BEDTIME    amLODipine (NORVASC) 5 MG tablet Take 1 tablet (5 mg total) by mouth at bedtime.    aspirin 81 MG chewable tablet Chew 1 tablet (81 mg total) by mouth daily.    atorvastatin (LIPITOR) 20 MG tablet TAKE 1 TABLET EVERY DAY    benazepril (LOTENSIN) 40 MG tablet Take 1 tablet (40 mg total) by mouth daily.    gabapentin (NEURONTIN) 100 MG capsule TAKE 1 CAPSULE EVERY MORNING AND TAKE 2 CAPSULES EVERY NIGHT    hydrochlorothiazide (HYDRODIURIL) 25 MG tablet Take 1 tablet (25 mg total) by mouth daily.    levothyroxine (SYNTHROID) 88 MCG tablet Take 1 tablet (88 mcg total) by mouth every morning. 30 minutes before food    metFORMIN (GLUCOPHAGE) 500 MG tablet Take 500 mg by mouth daily.    metoprolol tartrate (LOPRESSOR) 25 MG tablet Take 0.5 tablets (12.5 mg total) by mouth daily as needed (palpitations).    Misc Natural Products (NEURIVA PO) Take 1 tablet by mouth daily.    Misc Natural Products (OSTEO BI-FLEX ADV TRIPLE ST) TABS Take 1 tablet by mouth in the morning. 60mg  of Vitamin C  (Ascorbic Acid); 2mg  Manganese (Manganese Sulfate); 35mg  Sodium; 1500mg  Glucosamine HCI; 100mg  5-Loxin (Boswellia Serrata Extract resin); 1103mg  (Chondroitin/MSM Complex (Chrondroitin Sulfate, Methylsulfonylmethane, Collagen (Hydrolyzed Gelatin), Boswellia Serrata (resin); Boron (Bororganic Glycine); Hyaluronic Acid    Multiple Vitamins-Minerals (CENTRUM SILVER ADULT 50+) TABS Take 1 tablet by mouth in the morning.    nystatin (MYCOSTATIN/NYSTOP)  powder Apply 1 Application topically 3 (three) times daily.    phenylephrine (NEO-SYNEPHRINE) 1 % nasal spray Place 1 drop into both nostrils 3 (three) times daily as needed for congestion.    tamoxifen (NOLVADEX) 20 MG tablet Take 1 tablet (20 mg total) by mouth daily.    [DISCONTINUED] venlafaxine XR (EFFEXOR-XR) 37.5 MG 24 hr capsule TAKE 1 CAPSULE EVERY DAY WITH BREAKFAST 02/28/2023: d/c'd by patient   No facility-administered encounter medications on file as of 02/28/2023.     Today's Vitals   02/28/23 0932  BP: (!) 150/77  Pulse: 74  Resp: 15  Temp: 97.8 F (36.6 C)  TempSrc: Oral  SpO2: 95%  Weight: 159 lb 6.4 oz (72.3 kg)   Body mass index is 30.12 kg/m.   PHYSICAL EXAM GENERAL:alert, no distress and comfortable SKIN: no rash  EYES: sclera clear NECK: without mass LYMPH:  no palpable cervical or supraclavicular  lymphadenopathy  LUNGS:  normal breathing effort HEART: no lower extremity edema ABDOMEN: abdomen soft, non-tender and normal bowel sounds NEURO: alert & oriented x 3 with fluent speech, no focal motor deficits Breast exam: S/p left lumpectomy, incisions completely healed with moderate firm scar tissue.  No palpable mass or nodularity in either breast or axilla that I could appreciate.   CBC    Component Value Date/Time   WBC 7.8 02/28/2023 0912   RBC 4.52 02/28/2023 0912   HGB 13.3 02/28/2023 0912   HGB 13.1 05/04/2022 1344   HCT 40.8 02/28/2023 0912   HCT 41.5 05/04/2022 1344   PLT 251 02/28/2023 0912   PLT 338 05/04/2022 1344   MCV 90.3 02/28/2023 0912   MCV 93 05/04/2022 1344   MCH 29.4 02/28/2023 0912   MCHC 32.6 02/28/2023 0912   RDW 14.4 02/28/2023 0912   RDW 13.5 05/04/2022 1344   LYMPHSABS 1.5 02/28/2023 0912   MONOABS 0.7 02/28/2023 0912   EOSABS 0.3 02/28/2023 0912   BASOSABS 0.1 02/28/2023 0912     CMP     Component Value Date/Time   NA 140 10/03/2022 1152   K 4.6 10/03/2022 1152   CL 102 10/03/2022 1152   CO2 24 10/03/2022 1152    GLUCOSE 192 (H) 10/03/2022 1152   GLUCOSE 247 (H) 08/30/2022 0959   BUN 14 10/03/2022 1152   CREATININE 0.78 10/03/2022 1152   CREATININE 0.84 04/26/2021 0824   CREATININE 0.83 07/25/2016 1155   CALCIUM 10.2 10/03/2022 1152   PROT 7.4 08/30/2022 0959   PROT 6.9 07/31/2019 0756   ALBUMIN 4.0 08/30/2022 0959   ALBUMIN 4.2 07/31/2019 0756   AST 25 08/30/2022 0959   AST 25 04/26/2021 0824   ALT 20 08/30/2022 0959   ALT 25 04/26/2021 0824   ALKPHOS 55 08/30/2022 0959   BILITOT 0.3 08/30/2022 0959   BILITOT 0.3 04/26/2021 0824   GFRNONAA >60 08/30/2022 0959   GFRNONAA >60 04/26/2021 0824   GFRAA 72 07/31/2019 0756     ASSESSMENT & PLAN:Yvonne [name] is a 53 post menopausal female    1. 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 Luisa Hart showed: 5.5 cm IDC with DCIS. Final lateral margin was positive. Other margins and lymph nodes negative. -she received radiation under Dr. Basilio Cairo 1/16-2/13/23. -she started tamoxifen on 09/01/21, tolerating well without significant side effects -Yvonne [name] is clinically doing well.  Exam is benign, CBC is normal, we will follow-up the pending CMP from today.  Overall no clinical concern for recurrence. -Continue breast cancer surveillance and tamoxifen -Mammogram due 02/2023 -Follow-up in 6 months, or sooner if needed   2. Bone Health -repeat DEXA on 05/01/21 showed osteopenia (T-score of -2.0). -we previously discussed tamoxifen can strengthen her bones.    PLAN: -Labs reviewed -Continue breast cancer surveillance and tamoxifen -Patient stopped Effexor on her own, removed from med list -Mammogram due end of September at [facility], we will schedule -Follow-up in 6 months, or sooner if needed    All questions were answered. The patient knows to call the clinic with any problems, questions or concerns. No barriers to learning  were detected.   Santiago Glad, NP-C 02/28/2023

## 2023-02-28 ENCOUNTER — Inpatient Hospital Stay: Payer: Medicare HMO | Attending: Hematology

## 2023-02-28 ENCOUNTER — Encounter: Payer: Self-pay | Admitting: Nurse Practitioner

## 2023-02-28 ENCOUNTER — Telehealth: Payer: Self-pay | Admitting: Nurse Practitioner

## 2023-02-28 ENCOUNTER — Inpatient Hospital Stay (HOSPITAL_BASED_OUTPATIENT_CLINIC_OR_DEPARTMENT_OTHER): Payer: Medicare HMO | Admitting: Nurse Practitioner

## 2023-02-28 VITALS — BP 150/77 | HR 74 | Temp 97.8°F | Resp 15 | Wt 159.4 lb

## 2023-02-28 DIAGNOSIS — Z923 Personal history of irradiation: Secondary | ICD-10-CM | POA: Diagnosis not present

## 2023-02-28 DIAGNOSIS — Z79810 Long term (current) use of selective estrogen receptor modulators (SERMs): Secondary | ICD-10-CM | POA: Diagnosis not present

## 2023-02-28 DIAGNOSIS — Z170 Estrogen receptor positive status [ER+]: Secondary | ICD-10-CM | POA: Insufficient documentation

## 2023-02-28 DIAGNOSIS — M8580 Other specified disorders of bone density and structure, unspecified site: Secondary | ICD-10-CM | POA: Diagnosis not present

## 2023-02-28 DIAGNOSIS — C50512 Malignant neoplasm of lower-outer quadrant of left female breast: Secondary | ICD-10-CM | POA: Diagnosis not present

## 2023-02-28 LAB — COMPREHENSIVE METABOLIC PANEL
ALT: 25 U/L (ref 0–44)
AST: 28 U/L (ref 15–41)
Albumin: 4 g/dL (ref 3.5–5.0)
Alkaline Phosphatase: 50 U/L (ref 38–126)
Anion gap: 5 (ref 5–15)
BUN: 22 mg/dL (ref 8–23)
CO2: 29 mmol/L (ref 22–32)
Calcium: 9.1 mg/dL (ref 8.9–10.3)
Chloride: 104 mmol/L (ref 98–111)
Creatinine, Ser: 0.85 mg/dL (ref 0.44–1.00)
GFR, Estimated: >60 mL/min (ref >60)
Glucose, Bld: 184 mg/dL — ABNORMAL HIGH (ref 70–99)
Potassium: 4.1 mmol/L (ref 3.5–5.1)
Sodium: 138 mmol/L (ref 135–145)
Total Bilirubin: 0.4 mg/dL (ref 0.3–1.2)
Total Protein: 7.2 g/dL (ref 6.5–8.1)

## 2023-02-28 LAB — CBC WITH DIFFERENTIAL/PLATELET
Abs Immature Granulocytes: 0.04 10*3/uL (ref 0.00–0.07)
Basophils Absolute: 0.1 10*3/uL (ref 0.0–0.1)
Basophils Relative: 1 %
Eosinophils Absolute: 0.3 10*3/uL (ref 0.0–0.5)
Eosinophils Relative: 4 %
HCT: 40.8 % (ref 36.0–46.0)
Hemoglobin: 13.3 g/dL (ref 12.0–15.0)
Immature Granulocytes: 1 %
Lymphocytes Relative: 19 %
Lymphs Abs: 1.5 10*3/uL (ref 0.7–4.0)
MCH: 29.4 pg (ref 26.0–34.0)
MCHC: 32.6 g/dL (ref 30.0–36.0)
MCV: 90.3 fL (ref 80.0–100.0)
Monocytes Absolute: 0.7 10*3/uL (ref 0.1–1.0)
Monocytes Relative: 9 %
Neutro Abs: 5.3 10*3/uL (ref 1.7–7.7)
Neutrophils Relative %: 66 %
Platelets: 251 10*3/uL (ref 150–400)
RBC: 4.52 MIL/uL (ref 3.87–5.11)
RDW: 14.4 % (ref 11.5–15.5)
WBC: 7.8 10*3/uL (ref 4.0–10.5)
nRBC: 0 % (ref 0.0–0.2)

## 2023-02-28 NOTE — Telephone Encounter (Signed)
Per Lacie attempted to schedule mammography at [facility]. Was told system is down will try back later.

## 2023-03-05 ENCOUNTER — Ambulatory Visit: Payer: Medicare HMO | Admitting: Physical Therapy

## 2023-03-24 ENCOUNTER — Emergency Department (HOSPITAL_BASED_OUTPATIENT_CLINIC_OR_DEPARTMENT_OTHER): Payer: Medicare HMO

## 2023-03-24 ENCOUNTER — Emergency Department (HOSPITAL_COMMUNITY): Payer: Medicare HMO

## 2023-03-24 ENCOUNTER — Observation Stay (HOSPITAL_COMMUNITY)
Admission: EM | Admit: 2023-03-24 | Discharge: 2023-03-26 | Disposition: A | Discharge status: Home / Self Care | Payer: Medicare HMO | Attending: Family Medicine | Admitting: Family Medicine

## 2023-03-24 ENCOUNTER — Other Ambulatory Visit: Payer: Self-pay

## 2023-03-24 ENCOUNTER — Encounter (HOSPITAL_COMMUNITY): Payer: Self-pay | Admitting: Emergency Medicine

## 2023-03-24 DIAGNOSIS — Z96642 Presence of left artificial hip joint: Secondary | ICD-10-CM | POA: Insufficient documentation

## 2023-03-24 DIAGNOSIS — Z89421 Acquired absence of other right toe(s): Secondary | ICD-10-CM | POA: Insufficient documentation

## 2023-03-24 DIAGNOSIS — Z7984 Long term (current) use of oral hypoglycemic drugs: Secondary | ICD-10-CM | POA: Diagnosis not present

## 2023-03-24 DIAGNOSIS — F339 Major depressive disorder, recurrent, unspecified: Secondary | ICD-10-CM

## 2023-03-24 DIAGNOSIS — I639 Cerebral infarction, unspecified: Principal | ICD-10-CM

## 2023-03-24 DIAGNOSIS — E119 Type 2 diabetes mellitus without complications: Secondary | ICD-10-CM | POA: Diagnosis not present

## 2023-03-24 DIAGNOSIS — R7303 Prediabetes: Secondary | ICD-10-CM

## 2023-03-24 DIAGNOSIS — Z853 Personal history of malignant neoplasm of breast: Secondary | ICD-10-CM | POA: Diagnosis not present

## 2023-03-24 DIAGNOSIS — R42 Dizziness and giddiness: Principal | ICD-10-CM

## 2023-03-24 DIAGNOSIS — M79604 Pain in right leg: Secondary | ICD-10-CM

## 2023-03-24 DIAGNOSIS — I10 Essential (primary) hypertension: Secondary | ICD-10-CM | POA: Diagnosis not present

## 2023-03-24 DIAGNOSIS — Z7982 Long term (current) use of aspirin: Secondary | ICD-10-CM | POA: Insufficient documentation

## 2023-03-24 DIAGNOSIS — Z8673 Personal history of transient ischemic attack (TIA), and cerebral infarction without residual deficits: Secondary | ICD-10-CM | POA: Diagnosis not present

## 2023-03-24 DIAGNOSIS — E039 Hypothyroidism, unspecified: Secondary | ICD-10-CM | POA: Diagnosis not present

## 2023-03-24 DIAGNOSIS — R001 Bradycardia, unspecified: Secondary | ICD-10-CM

## 2023-03-24 DIAGNOSIS — Z952 Presence of prosthetic heart valve: Secondary | ICD-10-CM | POA: Insufficient documentation

## 2023-03-24 DIAGNOSIS — I779 Disorder of arteries and arterioles, unspecified: Secondary | ICD-10-CM

## 2023-03-24 DIAGNOSIS — Z85841 Personal history of malignant neoplasm of brain: Secondary | ICD-10-CM | POA: Diagnosis not present

## 2023-03-24 LAB — CBC
HCT: 40.3 % (ref 36.0–46.0)
Hemoglobin: 13.2 g/dL (ref 12.0–15.0)
MCH: 29.7 pg (ref 26.0–34.0)
MCHC: 32.8 g/dL (ref 30.0–36.0)
MCV: 90.8 fL (ref 80.0–100.0)
Platelets: 250 10*3/uL (ref 150–400)
RBC: 4.44 MIL/uL (ref 3.87–5.11)
RDW: 14.4 % (ref 11.5–15.5)
WBC: 8.6 10*3/uL (ref 4.0–10.5)
nRBC: 0 % (ref 0.0–0.2)

## 2023-03-24 LAB — BASIC METABOLIC PANEL
Anion gap: 11 (ref 5–15)
BUN: 15 mg/dL (ref 8–23)
CO2: 25 mmol/L (ref 22–32)
Calcium: 9 mg/dL (ref 8.9–10.3)
Chloride: 102 mmol/L (ref 98–111)
Creatinine, Ser: 0.8 mg/dL (ref 0.44–1.00)
GFR, Estimated: >60 mL/min (ref >60)
Glucose, Bld: 148 mg/dL — ABNORMAL HIGH (ref 70–99)
Potassium: 3.9 mmol/L (ref 3.5–5.1)
Sodium: 138 mmol/L (ref 135–145)

## 2023-03-24 LAB — CBG MONITORING, ED
Glucose-Capillary: 146 mg/dL — ABNORMAL HIGH (ref 70–99)
Glucose-Capillary: 106 mg/dL — ABNORMAL HIGH (ref 70–99)
Glucose-Capillary: 97 mg/dL (ref 70–99)

## 2023-03-24 MED ORDER — GABAPENTIN 100 MG PO CAPS
100.0000 mg | ORAL_CAPSULE | Freq: Every morning | ORAL | Status: DC
  Administered 2023-03-25 – 2023-03-26 (×2): 100 mg via ORAL
  Filled 2023-03-24 (×2): qty 1

## 2023-03-24 MED ORDER — ENOXAPARIN SODIUM 40 MG/0.4ML IJ SOSY
40.0000 mg | PREFILLED_SYRINGE | INTRAMUSCULAR | Status: DC
  Administered 2023-03-25 – 2023-03-26 (×2): 40 mg via SUBCUTANEOUS
  Filled 2023-03-24 (×2): qty 0.4

## 2023-03-24 MED ORDER — TAMOXIFEN CITRATE 10 MG PO TABS
20.0000 mg | ORAL_TABLET | Freq: Every day | ORAL | Status: DC
  Administered 2023-03-25 – 2023-03-26 (×2): 20 mg via ORAL
  Filled 2023-03-24 (×2): qty 2

## 2023-03-24 MED ORDER — ACETAMINOPHEN 325 MG PO TABS
650.0000 mg | ORAL_TABLET | Freq: Once | ORAL | Status: AC
  Administered 2023-03-24: 650 mg via ORAL
  Filled 2023-03-24: qty 2

## 2023-03-24 MED ORDER — LEVOTHYROXINE SODIUM 88 MCG PO TABS
88.0000 ug | ORAL_TABLET | ORAL | Status: DC
  Administered 2023-03-25 – 2023-03-26 (×2): 88 ug via ORAL
  Filled 2023-03-24 (×2): qty 1

## 2023-03-24 MED ORDER — STROKE: EARLY STAGES OF RECOVERY BOOK
Freq: Once | Status: AC
  Filled 2023-03-24: qty 1

## 2023-03-24 MED ORDER — ATORVASTATIN CALCIUM 40 MG PO TABS
40.0000 mg | ORAL_TABLET | Freq: Every day | ORAL | Status: DC
  Administered 2023-03-25 – 2023-03-26 (×2): 40 mg via ORAL
  Filled 2023-03-24 (×2): qty 1

## 2023-03-24 MED ORDER — ENOXAPARIN SODIUM 40 MG/0.4ML IJ SOSY: 40.0000 mg | PREFILLED_SYRINGE | INTRAMUSCULAR | Status: DC

## 2023-03-24 MED ORDER — ACETAMINOPHEN 500 MG PO TABS
1000.0000 mg | ORAL_TABLET | Freq: Four times a day (QID) | ORAL | Status: DC | PRN
  Administered 2023-03-24 – 2023-03-26 (×4): 1000 mg via ORAL
  Filled 2023-03-24 (×4): qty 2

## 2023-03-24 MED ORDER — INSULIN ASPART 100 UNIT/ML IJ SOLN
0.0000 [IU] | INTRAMUSCULAR | Status: DC
  Administered 2023-03-25: 2 [IU] via SUBCUTANEOUS
  Administered 2023-03-26 (×2): 1 [IU] via SUBCUTANEOUS
  Administered 2023-03-26: 2 [IU] via SUBCUTANEOUS
  Administered 2023-03-26: 1 [IU] via SUBCUTANEOUS

## 2023-03-24 MED ORDER — GABAPENTIN 100 MG PO CAPS
200.0000 mg | ORAL_CAPSULE | Freq: Every day | ORAL | Status: DC
  Administered 2023-03-24 – 2023-03-25 (×2): 200 mg via ORAL
  Filled 2023-03-24 (×2): qty 2

## 2023-03-24 MED ORDER — SODIUM CHLORIDE 0.9 % IV SOLN: INTRAVENOUS | Status: DC

## 2023-03-24 MED ORDER — ASPIRIN 81 MG PO CHEW
81.0000 mg | CHEWABLE_TABLET | Freq: Every day | ORAL | Status: DC
  Administered 2023-03-25 – 2023-03-26 (×2): 81 mg via ORAL
  Filled 2023-03-24 (×2): qty 1

## 2023-03-24 NOTE — ED Provider Notes (Signed)
  Physical Exam  BP (!) 161/77   Pulse 61   Temp (!) 97.5 F (36.4 C)   Resp 16   Ht 5\' 1"  (1.549 m)   Wt 68 kg   SpO2 96%   BMI 28.34 kg/m   Physical Exam HENT:     Head: Normocephalic and atraumatic.  Eyes:     Comments: Right eye exotropia  Cardiovascular:     Rate and Rhythm: Normal rate.     Heart sounds: Murmur heard.     Comments: Systolic murmur Abdominal:     General: Abdomen is flat.     Tenderness: There is no abdominal tenderness. There is no guarding or rebound.  Musculoskeletal:        General: No tenderness.     Right lower leg: No edema.     Left lower leg: No edema.  Skin:    Findings: No erythema.      Procedures  Procedures  ED Course / MDM   Medical Decision Making Amount and/or Complexity of Data Reviewed Labs: ordered. Radiology: ordered.  Risk OTC drugs.   Signout taken from outgoing ED team.  In brief, this is an 82 year old female with past medical history of CAD, hypertension, hypothyroidism, breast cancer, aortic stenosis status post TAVR presenting for evaluation of lightheadedness and dizziness for 1 day.  Patient also complained of right calf pain for which she had a negative DVT study.  She does state that she was exercising her right leg more than usual yesterday.  Regarding her dizziness, I asked patient specifically about this.  She says that this has been going on for 1 day and is provoked by rapid changes in position, particularly when standing.  She also comments that she has not eaten or drink anything during the day today.  Patient's ED workup reviewed.  No gross metabolic derangements or AKI.  No sign of leukocytosis.  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  This does correlate with her expressed symptoms.  Given these new findings, will consult neurology and plan for admission to the family medicine team.   Neurology evaluated patient scans and  felt that lesion may represent chronic infarct, however, unable to obtain brain MRI secondary to prior surgical clipping.  Cannot definitively say if infarct is acute versus chronic, but patient does have acute symptoms today.  Will plan for admission to family medicine team for stroke workup.        Lyman Speller, MD 03/24/23 Terence Lux    Eber Hong, MD 03/25/23 [phone]

## 2023-03-24 NOTE — ED Provider Notes (Signed)
[place] EMERGENCY DEPARTMENT AT [facility] Provider Note   CSN: [id number] Arrival date & time: 03/24/23  1141     History  Chief Complaint  Patient presents with   Dizziness   Leg Pain    Yvonne [name] is a 82 y.o. female.  With history of hypertension, previous CVA, anxiety, depression, arthritis, osteopenia, severe aortic stenosis status post TAVR on 06/26/2022 presents to the ED for evaluation of right calf pain and intermittent dizziness.  She states she woke up this morning with right calf pain.  She denies any knee or hip pain.  No falls that she is aware of.  No trauma.  Some mild ankle pain which is not new for her.  The calf pain is worse with palpation.  She also reports some intermittent dizziness and lightheadedness notes began this morning shortly after waking up.  She does report a sensation of the room spinning with changes in position as well as some lightheadedness.  No headaches, numbness, weakness, tingling, vision changes.  No prodromal symptoms of chest pain or shortness of breath.   Dizziness Leg Pain      Home Medications Prior to Admission medications   Medication Sig Start Date End Date Taking? Authorizing Provider  amitriptyline (ELAVIL) 50 MG tablet TAKE 1 TABLET AT BEDTIME 09/17/22   Westley Chandler, MD  amLODipine (NORVASC) 5 MG tablet Take 1 tablet (5 mg total) by mouth at bedtime. 04/30/22   Westley Chandler, MD  aspirin 81 MG chewable tablet Chew 1 tablet (81 mg total) by mouth daily. 12/23/19   Westley Chandler, MD  atorvastatin (LIPITOR) 20 MG tablet TAKE 1 TABLET EVERY DAY 09/17/22   Westley Chandler, MD  benazepril (LOTENSIN) 40 MG tablet Take 1 tablet (40 mg total) by mouth daily. 09/27/21   Westley Chandler, MD  gabapentin (NEURONTIN) 100 MG capsule TAKE 1 CAPSULE EVERY MORNING AND TAKE 2 CAPSULES EVERY NIGHT 09/17/22   Westley Chandler, MD  hydrochlorothiazide (HYDRODIURIL) 25 MG tablet Take 1 tablet (25 mg total) by mouth daily. 09/19/22    Christell Constant, MD  levothyroxine (SYNTHROID) 88 MCG tablet Take 1 tablet (88 mcg total) by mouth every morning. 30 minutes before food 07/03/22   Latrelle Dodrill, MD  metFORMIN (GLUCOPHAGE) 500 MG tablet Take 500 mg by mouth daily. 11/14/22   [provider]  metoprolol tartrate (LOPRESSOR) 25 MG tablet Take 0.5 tablets (12.5 mg total) by mouth daily as needed (palpitations). 12/17/22   Chandrasekhar, Rondel Jumbo, MD  Misc Natural Products (NEURIVA PO) Take 1 tablet by mouth daily.    [provider]  Misc Natural Products (OSTEO BI-FLEX ADV TRIPLE ST) TABS Take 1 tablet by mouth in the morning. 60mg  of Vitamin C  (Ascorbic Acid); 2mg  Manganese (Manganese Sulfate); 35mg  Sodium; 1500mg  Glucosamine HCI; 100mg  5-Loxin (Boswellia Serrata Extract resin); 1103mg  (Chondroitin/MSM Complex (Chrondroitin Sulfate, Methylsulfonylmethane, Collagen (Hydrolyzed Gelatin), Boswellia Serrata (resin); Boron (Bororganic Glycine); Hyaluronic Acid    [provider]  Multiple Vitamins-Minerals (CENTRUM SILVER ADULT 50+) TABS Take 1 tablet by mouth in the morning.    [provider]  nystatin (MYCOSTATIN/NYSTOP) powder Apply 1 Application topically 3 (three) times daily. 08/30/22   Pollyann Samples, NP  phenylephrine (NEO-SYNEPHRINE) 1 % nasal spray Place 1 drop into both nostrils 3 (three) times daily as needed for congestion.    [provider]  tamoxifen (NOLVADEX) 20 MG tablet Take 1 tablet (20 mg total) by mouth daily.  08/30/22   Pollyann Samples, NP      Allergies    Patient has no known allergies.    Review of Systems   Review of Systems  Musculoskeletal:  Positive for myalgias.  Neurological:  Positive for dizziness and light-headedness.  All other systems reviewed and are negative.   Physical Exam Updated Vital Signs BP (!) 161/77   Pulse 61   Temp 97.6 F (36.4 C) (Oral)   Resp 16   Ht 5\' 1"  (1.549 m)   Wt 68 kg   SpO2 96%   BMI 28.34 kg/m   Physical Exam Vitals and nursing note reviewed.  Constitutional:      General: She is not in acute distress.    Appearance: She is well-developed.     Comments: Hard of hearing, resting comfortably in bed  HENT:     Head: Normocephalic and atraumatic.  Eyes:     Conjunctiva/sclera: Conjunctivae normal.  Cardiovascular:     Rate and Rhythm: Normal rate and regular rhythm.     Heart sounds: No murmur heard. Pulmonary:     Effort: Pulmonary effort is normal. No respiratory distress.     Breath sounds: Normal breath sounds.  Abdominal:     Palpations: Abdomen is soft.     Tenderness: There is no abdominal tenderness.  Musculoskeletal:        General: No swelling.     Cervical back: Neck supple.  Skin:    General: Skin is warm and dry.     Capillary Refill: Capillary refill takes less than 2 seconds.  Neurological:     Mental Status: She is alert.     Comments:   MENTAL STATUS: AAOx3   LANG/SPEECH: Fluent, intact naming, repetition & comprehension   CRANIAL NERVES:   II: Pupils equal and reactive   III, IV, VI: EOM intact, right eye with right-sided deviation, no nystagmus   V: normal sensation of the face   VII: no facial asymmetry   VIII: Bilateral hearing aids.  Poor hearing to speech   MOTOR: 5/5 in both upper and lower extremities   SENSORY: Normal to touch in all extremiteis   COORD: Normal finger to nose, heel to shin and shoulder shrug, no tremor, no dysmetria. No pronator drift   Psychiatric:        Mood and Affect: Mood normal.     ED Results / Procedures / Treatments   Labs (all labs ordered are listed, but only abnormal results are displayed) Labs Reviewed  BASIC METABOLIC PANEL - Abnormal; Notable for the following components:      Result Value   Glucose, Bld 148 (*)    All other components within normal limits  CBG MONITORING, ED - Abnormal; Notable for the following components:   Glucose-Capillary 146 (*)    All other components within normal limits   CBC  URINALYSIS, ROUTINE W REFLEX MICROSCOPIC    EKG None  Radiology VAS [place] LOWER EXTREMITY VENOUS (DVT) (7a-7p)  Result Date: 03/24/2023  Lower Venous DVT Study Patient Name:  Yvonne [name]  Date of Exam:   03/24/2023 Medical Rec #: [id number]       Accession #:    [id number] Date of Birth: 1941-01-04      Patient Gender: F Patient Age:   47 years Exam Location:  [facility] Procedure:      VAS [place] LOWER EXTREMITY VENOUS (DVT) Referring Phys: Lyn Hollingshead Rasha Ibe --------------------------------------------------------------------------------  Indications: Pain.  Risk Factors: None identified. Comparison  Study: No prior studies. Performing Technologist: Chanda Busing RVT  Examination Guidelines: A complete evaluation includes B-mode imaging, spectral Doppler, color Doppler, and power Doppler as needed of all accessible portions of each vessel. Bilateral testing is considered an integral part of a complete examination. Limited examinations for reoccurring indications may be performed as noted. The reflux portion of the exam is performed with the patient in reverse Trendelenburg.  +---------+---------------+---------+-----------+----------+--------------+ RIGHT    CompressibilityPhasicitySpontaneityPropertiesThrombus Aging +---------+---------------+---------+-----------+----------+--------------+ CFV      Full           Yes      Yes                                 +---------+---------------+---------+-----------+----------+--------------+ SFJ      Full                                                        +---------+---------------+---------+-----------+----------+--------------+ FV Prox  Full                                                        +---------+---------------+---------+-----------+----------+--------------+ FV Mid   Full                                                        +---------+---------------+---------+-----------+----------+--------------+  FV DistalFull                                                        +---------+---------------+---------+-----------+----------+--------------+ PFV      Full                                                        +---------+---------------+---------+-----------+----------+--------------+ POP      Full           Yes      Yes                                 +---------+---------------+---------+-----------+----------+--------------+ PTV      Full                                                        +---------+---------------+---------+-----------+----------+--------------+ PERO     Full                                                        +---------+---------------+---------+-----------+----------+--------------+   +----+---------------+---------+-----------+----------+--------------+  LEFTCompressibilityPhasicitySpontaneityPropertiesThrombus Aging +----+---------------+---------+-----------+----------+--------------+ CFV Full           Yes      Yes                                 +----+---------------+---------+-----------+----------+--------------+     Summary: RIGHT: - There is no evidence of deep vein thrombosis in the lower extremity.  - No cystic structure found in the popliteal fossa.  LEFT: - No evidence of common femoral vein obstruction.   *See table(s) above for measurements and observations.    Preliminary     Procedures Procedures    Medications Ordered in ED Medications  acetaminophen (TYLENOL) tablet 650 mg (650 mg Oral Given 03/24/23 1333)    ED Course/ Medical Decision Making/ A&P Clinical Course as of 03/24/23 1552  Sun Mar 24, 2023  1530 S. R calf pain (no swelling, redness), intermittent dizzy/lightheaded x 1 day.  DVT study negative. Dizzy with room spinning sensations. Unclear triggers. Neuro exam here okay. F/u CT head. Reassess, consider neuroimaging [KM]    Clinical Course User Index [KM] Lyman Speller, MD                                  Medical Decision Making Amount and/or Complexity of Data Reviewed Labs: ordered. Radiology: ordered.  Risk OTC drugs.  This patient presents to the ED for concern of dizziness, right calf pain, this involves an extensive number of treatment options, and is a complaint that carries with it a high risk of complications and morbidity.  The differential diagnosis includes The emergent differential diagnosis for acute vertigo low includes peripheral causes such as BPPV, barotrauma, ear foreign body, Mnire's disease, infectious causes such as lip bronchitis, vestibular neuritis or Ramsay Hunt syndrome.  Other emergent causes are central such as cerebellar stroke, vertebrobasilar insufficiency, neoplastic causes, vertebral artery dissection, MS, neurosyphilis or tuberculosis, epilepsy or migraine.  Other causes include anemia, hyperviscosity syndrome, alcohol or aminoglycoside use, renal failure, hypoglycemia and thyroid disease.   Differential diagnosis for right calf pain includes DVT, calf strain, tibial fracture  My initial workup includes labs, imaging, pain control  Additional history obtained from: Nursing notes from this visit.  I ordered, reviewed and interpreted labs which include: CBC, BMP, urinalysis.  Hyperglycemia 148.  Urinalysis pending at shift change.  I ordered imaging studies including CT head, ultrasound DVT study I independently visualized and interpreted imaging which showed negative DVT study. I agree with the radiologist interpretation  Cardiac Monitoring:  The patient was maintained on a cardiac monitor.  I personally viewed and interpreted the cardiac monitored which showed an underlying rhythm of: Sinus bradycardia  Afebrile, hypertensive but otherwise hemodynamically stable.  82 year old female presenting to the ED for evaluation of right calf pain and intermittent dizziness and lightheadedness.  Symptoms began suddenly this morning.  Patient does  have significant difficulty hearing making physical exam slightly difficult, however there does not appear to be any focal neurologic deficits.  Dizziness is described as the room spinning.  It is mostly positional, but not specifically reproducible.  She denies any specific injuries to the right lower extremity and states that the pain is localized to the calf.  No knee pain.  No change in her typical ankle pain.  DVT study is negative.  Overall suspect some sort of muscular injury.  CT head pending at shift  change. Plan at time of shift change is to reassess after CT head. There is some consideration for central cause of her vertigo, MRI brain may be considered if CT negative. Care will be handed off to oncoming provider. Please see their note for final disposition and decision making. Plan may change at the discretion of the oncoming provider.   Note: Portions of this report may have been transcribed using voice recognition software. Every effort was made to ensure accuracy; however, inadvertent computerized transcription errors may still be present.         Final Clinical Impression(s) / ED Diagnoses Final diagnoses:  None    Rx / DC Orders ED Discharge Orders     None         Michelle Piper, PA-C 03/24/23 1552    Elayne Snare K, DO 03/24/23 1603

## 2023-03-24 NOTE — Progress Notes (Signed)
Right lower extremity venous duplex has been completed. Preliminary results can be found in CV Proc through chart review.  Results were given to [facility] PA.  03/24/23 3:02 PM Olen Cordial RVT

## 2023-03-24 NOTE — H&P (Signed)
Hospital Admission History and Physical Service Pager: [phone]  Patient name: NATACHA IDEKER Medical record number: [id number] Date of Birth: 09/22/1940 Age: 82 y.o. Gender: female  Primary Care Provider: System, Provider Not In Consultants: Neurology Code Status: Full Preferred Emergency Contact:  Contact Information     Name Relation Home Work Kevil Other [phone]  [phone]   Ashley Murrain [phone]        Other Contacts   None on File        Chief Complaint: Dizziness  Assessment and Plan: DONNY DUBBERT is a 82 y.o. female presenting with dizziness and R LE pain . Differential for dizziness includes TIA/Stroke, arrhythmia, orthostatic hypotension, inner ear etiologies (BPPV, mennieres). TIA/Stroke is considered given hx of stroke and infarct noted on CT. However age of infarct is indeterminant and Neuro thinks it may be chronic rather than acute. Reassuringly, neuro exam w/o focal deficits. Arrhthmia and orthostatic hypotension is considered given low HR and takes multiple antihypertensives, including a prn metoprolol. Will hold antihypertensives for now (also for permissive HTN in case of acute stroke). Inner ear etiologies are considered, although unclear if dizziness is positional.  Assessment & Plan Cerebrovascular accident (CVA), unspecified mechanism (HCC) - Admitted to [facility]MT[facility] Med Tele w/ attending Dr. Linwood Dibbles - Cont home ASA daily - Cont home atorvastatin - Neurology was consulted in ED. Will have AM team reach out  to make sure Neurology is officially consulted.  - f/u A1c, TSH, lipid - f/u echo - BL carotid US - SLP, PT, OT eval and treat - Cardiac monitoring Dizziness - Cardiac monitoring - Stroke workup as above - Holding home amitriptyline, venlafaxine in case these are contributing. Right leg pain Acute RLE pain x1day. DVT US neg for clot. RLE appears nontender, nonerythematous, nonedematous, and no deformities; thus  lower c/f cellulitis, trauma. Could consider neuropathic pain, sciatica. - Cont home gabapentin Primary hypertension - Holding home benazepril, amlodipine, hydrochlorothiazide, metop for permissive HTN. Bradycardia Borderline brady HR's during this admission, but maintaining MAPs. Could be due to home metop. - Holding home metop - Cardiac monitoring as above Prediabetes - SSI - holding home metformin   Chronic and Stable Problems:  Hypothyroidism: Cont home levothyroxine. Checking TSH   FEN/GI: Regular VTE Prophylaxis: Lovenox  Disposition: Med Surg  History of Present Illness:  MARINDA JONCAS is a 82 y.o. female presenting with 1 day hx of shooting R leg pain and dizziness. Woke up this morning with shooting R leg pain and dizziness. Denies SOB, chest pain, LOC. Denies hitting her head. Reports R sided weakness. No N/V. Has a hx of stroke in her 48s. Reports taking ASA daily. Reports taking her AM meds. Was able to eat a sandwich while in ED without issue.   ED Course: CBC, BMP unremarkable. DVT US showed no evidence of clot in BL LE. CT head notable for infarct of posterior R cerebellum (unsure if acute). EKG showed sinus brady, overall stable compared to prior. Neurology was consulted, thought that infarct could be chronic but unable to obtain MRI due to surgical clips from prior craniotomy. FMTS was consulted for stroke workup.   Pertinent Past Medical History: Hypothyroidism HTN Severe aortic stenosis Blind R eye BL carotid disease Hearing impairment   Remainder reviewed in history tab.   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  Pertinent Social History: Lives alone.  Pertinent Family History: Mother: thyroid disease Father: Heart disease  Remainder reviewed in history tab.   Important  Outpatient Medications: ASA Tamoxifen Amlodipine Atorvastatin Benazepril Hydrochlorothiazide Metop Amitriptyline Levothyroxine Metformin Gabapentin Nystatin powder  Remainder reviewed in medication history.   Objective: BP (!) 161/69 (BP Location: Right Arm)   Pulse (!) 59   Temp 97.6 F (36.4 C) (Oral)   Resp 16   Ht 5\' 1"  (1.549 m)   Wt 68 kg   SpO2 96%   BMI 28.34 kg/m  Exam: General: Alert, older women sitting up in bed. Hard of hearding. NAD. Responding appropriately in full sentences. Neuro: Alert and grossly oriented.  Neuro: 5/5, equal BL UE and LE strength. CN2: no vision changes CN3,4,6: L pupil round, reactive to light. R eye chronicall blind. EOMI CN5: Sensation intact BL CN7: Facial expressions symmetric CN8: Difficult to assess, chronically difficult hearing CN9: palate symmetric  CN10: regular speech CN11: turns head against resistance CN12: tongue midline  Cardiovascular: RRR, no murmurs Respiratory: CTAB, no wheezing or crackles. Normal WOB on RA, speaking in full sentences. Gastrointestinal: Soft, nontender, nondistended. Normal BS.  Ext: BL LE nontender, nonerythematous, nonedematous. BL dorsalis pedis pulses 2+  Labs:  CBC BMET  Recent Labs  Lab 03/24/23 1204  WBC 8.6  HGB 13.2  HCT 40.3  PLT 250   Recent Labs  Lab 03/24/23 1204  NA 138  K 3.9  CL 102  CO2 25  BUN 15  CREATININE 0.80  GLUCOSE 148*  CALCIUM 9.0     EKG: My own interpretation is sinus bradycardia, stable RBBB. Overall stable compared to prior EKG.     Imaging Studies Performed:  CT Head 1. Wedge-shaped infarct in the posterior right cerebellum is new since the prior study and may be acute. This corresponds to the patient's acute symptoms. 2. Stable remote infarct of the left insular cortex and superior temporal gyrus. 3. Stable atrophy and white matter disease. This likely reflects the sequela of chronic microvascular ischemia. 4. Stable left temporal  craniotomy.  DVT US RIGHT:  - There is no evidence of deep vein thrombosis in the lower extremity.  - No cystic structure found in the popliteal fossa.  LEFT:  - No evidence of common femoral vein obstruction.        Lincoln Brigham, MD 03/24/2023, 7:39 PM PGY-2, [facility]  FPTS Intern pager: [phone], text pages welcome Secure chat group [facility]

## 2023-03-24 NOTE — ED Triage Notes (Addendum)
PT came in POV for Dizziness (room spinning) and 7/10 pain in right leg beginning this AM.  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 Pt states she still has baseline weakness and tingling to right arm and leg. NO facial droop or slurred speech.  Pt moving all extremities freely.  Pt has hx of left side breast cancer.

## 2023-03-24 NOTE — Assessment & Plan Note (Signed)
-   Cardiac monitoring - Stroke workup as above - Holding home amitriptyline, venlafaxine in case these are contributing.

## 2023-03-24 NOTE — Hospital Course (Addendum)
KARISA NESSER is a 82 y.o.female with a history of breast cancer on tamoxifen, brain tumor s/p craniotomy, DM2, TAVR, hypothyroidism, BL carotid disease, hearing difficulty, blind in right eye who was admitted to the [facility] Teaching Service at [facility] for dizziness and right leg pain. Her hospital course is detailed below:  Dizziness  Concern for CVA Suspected secondary to CVA but unsure if infarct is chronic on CT (wedge shaped infarct in posterior right cerebellum). Neurology consulted and recommended outpatient stroke workup as infarct on CT is likely neither acute nor subacute. Risk strat labs obtained and showed nothing of concern. Antiplatelets started 10/7 ASA 81 mg.  At the same time began evaluation for cardiogenic causes versus inner ear etiologies. US carotid with doppler was ordered and showed mild stenosis of the R ICA, and occlusion of the L CCA on preliminary results which is unchanged from previous studies per chart review. 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 Patient had PT/OT eval which showed no BPPV or peripheral vertigo.  Based on their evaluation they feel this is a sequela of her prior CVAs and they recommend home health PT.  Other chronic conditions were medically managed with home medications and formulary alternatives as necessary. Antihypertensives were held inpatient.   PCP Follow-up Recommendations: Outpatient referral to neurology HHPT Recommend discontinuing BB if possible due to low HR and orthostasis. Please taper off elavil due to possible dizzying side effects. Please ensure she has stopped after 2 weeks. Consider starting back BP meds as tolerated

## 2023-03-25 ENCOUNTER — Encounter (HOSPITAL_COMMUNITY): Payer: Self-pay | Admitting: Student

## 2023-03-25 ENCOUNTER — Observation Stay (HOSPITAL_BASED_OUTPATIENT_CLINIC_OR_DEPARTMENT_OTHER): Payer: Medicare HMO

## 2023-03-25 DIAGNOSIS — I6389 Other cerebral infarction: Secondary | ICD-10-CM

## 2023-03-25 DIAGNOSIS — R42 Dizziness and giddiness: Secondary | ICD-10-CM | POA: Diagnosis not present

## 2023-03-25 LAB — CBG MONITORING, ED
Glucose-Capillary: 172 mg/dL — ABNORMAL HIGH (ref 70–99)
Glucose-Capillary: 90 mg/dL (ref 70–99)
Glucose-Capillary: 198 mg/dL — ABNORMAL HIGH (ref 70–99)

## 2023-03-25 LAB — CBC
HCT: 38.9 % (ref 36.0–46.0)
Hemoglobin: 12.7 g/dL (ref 12.0–15.0)
MCH: 28.9 pg (ref 26.0–34.0)
MCHC: 32.6 g/dL (ref 30.0–36.0)
MCV: 88.6 fL (ref 80.0–100.0)
Platelets: 216 10*3/uL (ref 150–400)
RBC: 4.39 MIL/uL (ref 3.87–5.11)
RDW: 14.4 % (ref 11.5–15.5)
WBC: 9.8 10*3/uL (ref 4.0–10.5)
nRBC: 0 % (ref 0.0–0.2)

## 2023-03-25 LAB — GLUCOSE, CAPILLARY
Glucose-Capillary: 123 mg/dL — ABNORMAL HIGH (ref 70–99)
Glucose-Capillary: 112 mg/dL — ABNORMAL HIGH (ref 70–99)
Glucose-Capillary: 126 mg/dL — ABNORMAL HIGH (ref 70–99)

## 2023-03-25 LAB — URINALYSIS, ROUTINE W REFLEX MICROSCOPIC
Bilirubin Urine: NEGATIVE
Glucose, UA: NEGATIVE mg/dL
Hgb urine dipstick: NEGATIVE
Ketones, ur: NEGATIVE mg/dL
Nitrite: NEGATIVE
Protein, ur: NEGATIVE mg/dL
Specific Gravity, Urine: 1.011 (ref 1.005–1.030)
WBC, UA: >50 WBC/hpf (ref 0–5)
pH: 7 (ref 5.0–8.0)

## 2023-03-25 LAB — COMPREHENSIVE METABOLIC PANEL
ALT: 25 U/L (ref 0–44)
AST: 31 U/L (ref 15–41)
Albumin: 3.2 g/dL — ABNORMAL LOW (ref 3.5–5.0)
Alkaline Phosphatase: 47 U/L (ref 38–126)
Anion gap: 11 (ref 5–15)
BUN: 11 mg/dL (ref 8–23)
CO2: 25 mmol/L (ref 22–32)
Calcium: 8.5 mg/dL — ABNORMAL LOW (ref 8.9–10.3)
Chloride: 104 mmol/L (ref 98–111)
Creatinine, Ser: 0.68 mg/dL (ref 0.44–1.00)
GFR, Estimated: >60 mL/min (ref >60)
Glucose, Bld: 118 mg/dL — ABNORMAL HIGH (ref 70–99)
Potassium: 3.3 mmol/L — ABNORMAL LOW (ref 3.5–5.1)
Sodium: 140 mmol/L (ref 135–145)
Total Bilirubin: 0.4 mg/dL (ref 0.3–1.2)
Total Protein: 6.4 g/dL — ABNORMAL LOW (ref 6.5–8.1)

## 2023-03-25 LAB — ECHOCARDIOGRAM COMPLETE
AR max vel: 1.79 cm2
AV Area VTI: 2.06 cm2
AV Area mean vel: 1.86 cm2
AV Mean grad: 9 mm[Hg]
AV Peak grad: 16.6 mm[Hg]
Ao pk vel: 2.04 m/s
Area-P 1/2: 2.37 cm2
Height: 61 in
MV VTI: 2.17 cm2
S' Lateral: 1.5 cm
Weight: 2400 [oz_av]

## 2023-03-25 LAB — LIPID PANEL
Cholesterol: 126 mg/dL (ref 0–200)
HDL: 40 mg/dL — ABNORMAL LOW (ref >40)
LDL Cholesterol: 52 mg/dL (ref 0–99)
Total CHOL/HDL Ratio: 3.2 {ratio}
Triglycerides: 168 mg/dL — ABNORMAL HIGH (ref <150)
VLDL: 34 mg/dL (ref 0–40)

## 2023-03-25 LAB — TSH: TSH: 1.868 u[IU]/mL (ref 0.350–4.500)

## 2023-03-25 LAB — HEMOGLOBIN A1C
Hgb A1c MFr Bld: 7.4 % — ABNORMAL HIGH (ref 4.8–5.6)
Mean Plasma Glucose: 165.68 mg/dL

## 2023-03-25 MED ORDER — POTASSIUM CHLORIDE 20 MEQ PO PACK
40.0000 meq | PACK | Freq: Two times a day (BID) | ORAL | Status: AC
  Administered 2023-03-25: 40 meq via ORAL
  Filled 2023-03-25 (×2): qty 2

## 2023-03-25 MED ORDER — MELATONIN 5 MG PO TABS
5.0000 mg | ORAL_TABLET | Freq: Every evening | ORAL | Status: DC | PRN
  Administered 2023-03-26: 5 mg via ORAL
  Filled 2023-03-25: qty 1

## 2023-03-25 NOTE — ED Notes (Signed)
IP provider at bedside.

## 2023-03-25 NOTE — ED Notes (Signed)
Pt back from imaging, NAD noted at this time.

## 2023-03-25 NOTE — ED Notes (Signed)
Orthostatic VS Pt denied dizziness/lightheadedness throughout    03/25/23 1000 03/25/23 1117 03/25/23 1119  Vitals  BP (!) 148/64 136/74 (!) 126/58  MAP (mmHg) 89 91 78  BP Location Left Arm Left Arm Left Arm  BP Method Automatic Automatic Automatic  Patient Position (if appropriate) Lying Sitting Standing  Pulse Rate 65 82 89  Pulse Rate Source Monitor Monitor Monitor  ECG Heart Rate 65 82 90  Resp 16 12 (!) 22

## 2023-03-25 NOTE — Consult Note (Addendum)
NEUROLOGY CONSULT NOTE   Date of service: March 25, 2023 Patient Name: Yvonne [name] MRN:  [id number] DOB:  07/17/1940 Chief Complaint: "dizziness"  History of Present Illness  Yvonne [name] is a 82 y.o. 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 She reports has had several episodes of dizziness that started after church yesterday. These episodes are intermittent during which she feels very light- headed. She denies feeling like the room is spinning. She is also bothered by right lower extremity pain that started yesterday.  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 Clifton James & Dr. Laneta Simmers   Severe aortic stenosis    Stroke [facility])    pt was 47   Past Surgical History:  Procedure Laterality Date   ABDOMINAL HYSTERECTOMY     BACK SURGERY     2019   BRAIN SURGERY     age 20's- steel plate in back of head   BREAST LUMPECTOMY WITH RADIOACTIVE SEED AND SENTINEL LYMPH NODE BIOPSY Left 05/17/2021   Procedure: LEFT BREAST SEED LOCALIZED LUMPECTOMY (BRACKETED) WITH SENTINEL LYMPH NODE BIOPSY;  Surgeon: Harriette Bouillon, MD;  Location: [facility] OR;  Service: General;  Laterality: Left;  PEC BLOCK 90 MINUTES  ROOM 9   CATARACT EXTRACTION Bilateral    INTRAOPERATIVE TRANSTHORACIC ECHOCARDIOGRAM N/A 06/26/2022   Procedure: INTRAOPERATIVE TRANSTHORACIC ECHOCARDIOGRAM;  Surgeon: Kathleene Hazel, MD;  Location: [facility] OR;  Service: Open Heart Surgery;  Laterality: N/A;   RE-EXCISION OF BREAST LUMPECTOMY Left 06/08/2021   Procedure: RE-EXCISION OF LEFT BREAST LUMPECTOMY;  Surgeon: Harriette Bouillon, MD;  Location: [facility] ORS;  Service: General;  Laterality: Left;   RIGHT HEART CATH AND CORONARY ANGIOGRAPHY N/A 05/17/2022   Procedure: RIGHT HEART CATH AND CORONARY ANGIOGRAPHY;  Surgeon: Kathleene Hazel, MD;  Location: [facility] INVASIVE CV LAB;  Service: Cardiovascular;  Laterality: N/A;   TOE AMPUTATION  2013   2nd toe on right foot, hammer toe   TONSILLECTOMY     TOOTH EXTRACTION     all teeth removed   TOTAL HIP ARTHROPLASTY Left 03/19/2017   Procedure: LEFT TOTAL HIP ARTHROPLASTY ANTERIOR APPROACH;  Surgeon: Kathryne Hitch, MD;  Location: [facility] OR;  Service: Orthopedics;  Laterality: Left;   TRANSCATHETER AORTIC VALVE REPLACEMENT, TRANSFEMORAL N/A 06/26/2022   Procedure: TRANSCATHETR AORTIC VALVE REPLACEMENT, TRANSFEMORAL;  Surgeon: Kathleene Hazel, MD;  Location: [facility] OR;  Service: Open Heart Surgery;  Laterality: N/A;   ULTRASOUND GUIDANCE FOR VASCULAR ACCESS Bilateral 06/26/2022   Procedure: ULTRASOUND GUIDANCE FOR VASCULAR ACCESS;  Surgeon: Kathleene Hazel, MD;  Location: [facility] OR;  Service: Open Heart Surgery;  Laterality: Bilateral;   Family History  Problem Relation Age of Onset   Thyroid disease Mother    Heart disease Father    Social History   Socioeconomic History   Marital status: Widowed    Spouse name: Not on file   Number of children: 2   Years of education: masters   Highest education level: Not on file  Occupational History   Occupation: Government social research officer: RETIRED  Tobacco Use   Smoking status: Never   Smokeless tobacco: Never  Vaping Use   Vaping  status: Never Used  Substance and Sexual Activity   Alcohol use: Not Currently    Alcohol/week: 1.0 standard drink of alcohol    Types: 1 Glasses of wine per week   Drug use: No   Sexual activity: Not Currently    Birth control/protection: Post-menopausal  Other Topics Concern   Not on file  Social History Narrative   Health Care POA:    Emergency Contact: son, Andee Poles, (c) [phone]   End of Life Plan:    Who lives with you: lives alone now    Any pets: 2 cats, Sadie and Katie   Diet: Pt has a varied diet and tries to limit sweets.    Exercise: Pt walks on treadmill 10 minutes a day and uses bike 10 minutes a day.   Seatbelts: Pt reports wearing seatbelt when in vehicles.    Sun Exposure/Protection: Pt does not use sun protection.   Hobbies: reading, watching movies, writing      Husband passed away about 10 years ago    Social Determinants of Health   Financial Resource Strain: Low Risk  (06/23/2021)   Overall Financial Resource Strain (CARDIA)    Difficulty of Paying Living Expenses: Not very hard  Food Insecurity: No Food Insecurity (03/25/2023)   Hunger Vital Sign    Worried About Running Out of Food in the Last Year: Never true    Ran Out of Food in the Last Year: Never true  Transportation Needs: No Transportation Needs (03/25/2023)   PRAPARE - Administrator, Civil Service (Medical): No    Lack of Transportation (Non-Medical): No  Physical Activity: Unknown (06/07/2018)   Exercise Vital Sign    Days of Exercise per Week: Patient declined    Minutes of Exercise per Session: Patient declined  Stress: No Stress Concern Present (06/23/2021)   [employer] of Occupational Health - Occupational Stress Questionnaire    Feeling of Stress : Only a little  Social Connections: Moderately Integrated (06/23/2021)   Social Connection and Isolation Panel [NHANES]    Frequency of Communication with Friends and Family: More than three times a week    Frequency of  Social Gatherings with Friends and Family: Once a week    Attends Religious Services: More than 4 times per year    Active Member of [employer] or Organizations: Yes    Attends Banker Meetings: Never    Marital Status: Widowed    Medications  Amitriptyline 50 mg Amlodipine 5 mg Asa 81 mg Atorvastatin 20 mg Benazepril 40 mg Gabapentin 100 mg Hydrochlorothiazide 25 mg Levothyroxine 88 mcg Metformin 500 mg every day Metoprolol tartrate 12/5 mg every day PRN Tamoxifen 20 mg  Vitals   Vitals:   03/25/23 0953 03/25/23 1000 03/25/23 1117 03/25/23 1119  BP: (!) 162/64 (!) 148/64 136/74 (!) 126/58  Pulse: 62 65 82 89  Resp: 16 16 12  (!) 22  Temp: 98.2 F (36.8 C)     TempSrc:  Oral     SpO2: 94%  95% 94%  Weight:      Height:         Body mass index is 28.34 kg/m.  Physical Exam   Constitutional: elderly female who is hard of hearing Mental Status: Patient is awake, alert, oriented to self, location and year No signs of aphasia or neglect Cranial Nerves: II: Pupils equal, round, and reactive to light.   III,IV, VI: EOMI without ptosis or diploplia. No nystagmus. V: Facial sensation is symmetric to light touch. VII: Facial movement is symmetric.  VIII: Hard of hearing despite hearing aids. X: Uvula elevates symmetrically XI: Shoulder shrug is symmetric. Motor: good effort thorughout, and 5/5 bilateral UE, 5/5 bilateral LE Sensory: Sensation is grossly intact to bilateral UE & LE Cerebellar: Finger-Nose and Heel-Shin intact bilaterally  Labs   CBC:  Recent Labs  Lab 03/24/23 1204 03/25/23 0128  WBC 8.6 9.8  HGB 13.2 12.7  HCT 40.3 38.9  MCV 90.8 88.6  PLT 250 216    Basic Metabolic Panel:  Lab Results  Component Value Date   NA 140 03/25/2023   K 3.3 (L) 03/25/2023   CO2 25 03/25/2023   GLUCOSE 118 (H) 03/25/2023   BUN 11 03/25/2023   CREATININE 0.68 03/25/2023   CALCIUM 8.5 (L) 03/25/2023   GFRNONAA >60 03/25/2023   GFRAA 72 07/31/2019    Lipid Panel:  Lab Results  Component Value Date   LDLCALC 52 03/25/2023   HgbA1c:  Lab Results  Component Value Date   HGBA1C 7.4 (H) 03/25/2023   CT Head without contrast(Personally reviewed): 1. Wedge-shaped infarct in the posterior right cerebellum is new since the prior study and may be acute. This corresponds to the patient's acute symptoms. 2. Stable remote infarct of the left insular cortex and superior temporal gyrus. 3. Stable atrophy and white matter disease. This likely reflects the sequela of chronic microvascular ischemia. 4. Stable left temporal craniotomy.   Impression   Yvonne [name] is a 82 y.o. female with PMH of diabetes, HTN, prior cva with right hemiparesis presenting with intermittent dizziness. Her description of episodes is described as light headed feeling with positional changes. Orthostatics were positive with systolic. On physical exam, there is no dysmetria on finger to nose or heel to shin testing. Also no nystagmus. I think that CT finding are incidental and represent old CVA which can be worked up as outpatient.    Primary Diagnosis:  Chronic right posterior cerebellar CVA  Secondary Diagnosis: Type 2 diabetes mellitus w/o complications  Recommendations   -neurology evaluation as outpatient as CVA not acute to subacute -recheck orthostatics following fluids -PT/OT eval -Restart ASA and continue atorvastatin for secondary prevention -recommend discontinuing TCA and BB as could contribute to orthostatic hypotension -neurology sign off ______________________________________________________________________   Chong Sicilian. Masters, D.O.  Internal Medicine Resident, PGY-3 [facility]  Pager: [phone] 2:38 PM, 03/25/2023   NEUROHOSPITALIST ADDENDUM Performed a face to face diagnostic evaluation.   I have reviewed the contents of history and physical exam as documented by PA/ARNP/Resident and agree  with above documentation.  I have discussed and formulated the above plan as documented. Edits to the note have been made as needed.  Impression: describes dizziness as episodic lightheadedness rather than room spinning. When we stod her up, she reported feeling dizzy. Resolved when she laid down in the bed. Not triggered by turning her head. Neuro exam with no signs or cerebellar dysfunction, specifically  no ataxia, past pointing, no nsytagmus, intact coordination. Overall suspicion for acute stroke is low. The noted R cerebeller stroke is new since prior imaging from 6 years ago but in the absence of clinical findings concerning for cerebellar dysfunction, low suspicion that this is acute or subacute. She is unable to get MRI due to surgical clips that per team are not MRI compatible. Recommend starting her on aspirin as above and follow up outpatient with neurology for outpatient stroke workup.  We will signoff.  Plan discussed with her team over secure chat.  Key exam findings: Plan:  Erick Blinks, MD [facility] [phone]   If 7pm to 7am, please call on call as listed on AMION.

## 2023-03-25 NOTE — Assessment & Plan Note (Signed)
Differential for dizziness in a geriatric patient remains broad.  We will consider causes as listed above. -Echocardiogram to assess for valve issues and aortic stenosis -Telemetry to monitor for arrhythmias -Admission labs show no glaring electrolyte imbalances.  Potassium mildly decreased, no evidence of anemia -PT/OT eval with attention to BPPV -Obtain orthostatics
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
## 2023-03-25 NOTE — Evaluation (Addendum)
Occupational Therapy Evaluation Patient Details Name: Yvonne [name] MRN: [id number] DOB: 1941-05-19 Today's Date: 03/25/2023   History of Present Illness 82 y.o. female presenting with dizziness and R LE pain  PMH includes: HTN, CVA, L THA.   Clinical Impression   Patient admitted for the diagnosis above.  She continues to endorse mild dizziness initially once standing.  No dizziness with rolling, sitting up, and dizziness subsides after a few seconds in standing.  BP's stable, and actually went up in standing.  PT consult pending.  PTA she has a PCA for 2 hours/day to assist with light iADL, medications and community mobility.  The patient completes her own ADL and walks at home with a SPC.  Currently she is needing CGA to supervision for mobility and ADL completion due to poor safety and impulsivity.  OT will follow in the acute setting to address deficits, no post acute OT is anticipated.        If plan is discharge home, recommend the following: Assist for transportation;Assistance with cooking/housework;Direct supervision/assist for medications management;Direct supervision/assist for financial management    Functional Status Assessment  Patient has had a recent decline in their functional status and demonstrates the ability to make significant improvements in function in a reasonable and predictable amount of time.  Equipment Recommendations  None recommended by OT    Recommendations for Other Services       Precautions / Restrictions Precautions Precautions: Fall Restrictions Weight Bearing Restrictions: No      Mobility Bed Mobility                    Transfers Overall transfer level: Needs assistance Equipment used: None Transfers: Sit to/from Stand, Bed to chair/wheelchair/BSC Sit to Stand: Contact guard assist     Step pivot transfers: Contact guard assist            Balance Overall balance assessment: Needs assistance Sitting-balance support:  Feet supported Sitting balance-Leahy Scale: Good     Standing balance support: Single extremity supported Standing balance-Leahy Scale: Fair Standing balance comment: Impulsive                           ADL either performed or assessed with clinical judgement   ADL       Grooming: Wash/dry hands;Wash/dry face;Supervision/safety;Standing               Lower Body Dressing: Supervision/safety;Sit to/from stand   Toilet Transfer: Ambulance person;Ambulation             General ADL Comments: Patient came with a SPC, unable to locate     Vision Patient Visual Report: No change from baseline       Perception Perception: Within Functional Limits       Praxis Praxis: [place]       Pertinent Vitals/Pain Pain Assessment Pain Assessment: Faces Faces Pain Scale: Hurts a little bit Pain Location: R leg from heel to thigh Pain Descriptors / Indicators: Sore Pain Intervention(s): Monitored during session     Extremity/Trunk Assessment Upper Extremity Assessment Upper Extremity Assessment: Overall WFL for tasks assessed   Lower Extremity Assessment Lower Extremity Assessment: Defer to PT evaluation   Cervical / Trunk Assessment Cervical / Trunk Assessment: Kyphotic   Communication Communication Communication: No apparent difficulties;Hearing impairment   Cognition Arousal: Alert Behavior During Therapy: Impulsive Overall Cognitive Status: History of cognitive impairments - at baseline  General Comments: decreased insight, safety and short term memory.  Very HOH     General Comments       Exercises     Shoulder Instructions      Home Living Family/patient expects to be discharged to:: Private residence Living Arrangements: Alone Available Help at Discharge: Personal care attendant;Available PRN/intermittently Type of Home: House Home Access: Stairs to enter [employer] of Steps: 4-5 Entrance Stairs-Rails: Left;Right;Can reach both Home Layout: One level     Bathroom Shower/Tub: Chief Strategy Officer: Standard Bathroom Accessibility: Yes How Accessible: Accessible via walker Home Equipment: Rollator (4 wheels);Cane - single point          Prior Functioning/Environment Prior Level of Function : Needs assist             Mobility Comments: Uses a cane for in the home and out on the community ADLs Comments: Able to complete her own ADL, assist with medications, community mobility, light meal prep and light home management via PCA 2 hours/day.        OT Problem List: Impaired balance (sitting and/or standing);Decreased safety awareness      OT Treatment/Interventions: Self-care/ADL training;Therapeutic activities;Balance training;DME and/or AE instruction    OT Goals(Current goals can be found in the care plan section) Acute Rehab OT Goals Patient Stated Goal: Return home OT Goal Formulation: With patient Time For Goal Achievement: 04/08/23 Potential to Achieve Goals: Good ADL Goals Pt Will Perform Grooming: with modified independence;standing Pt Will Perform Lower Body Dressing: with modified independence;sit to/from stand Pt Will Transfer to Toilet: with modified independence;ambulating;regular height toilet  OT Frequency: Min 1X/week    Co-evaluation              AM-PAC OT "6 Clicks" Daily Activity     Outcome Measure Help from another person eating meals?: None Help from another person taking care of personal grooming?: A Little Help from another person toileting, which includes using toliet, bedpan, or urinal?: A Little Help from another person bathing (including washing, rinsing, drying)?: A Little Help from another person to put on and taking off regular upper body clothing?: None Help from another person to put on and taking off regular lower body clothing?: A Little 6 Click Score: 20   End  of Session Equipment Utilized During Treatment: Gait belt Nurse Communication: Mobility status  Activity Tolerance: Patient tolerated treatment well Patient left: in bed;with call bell/phone within reach;with nursing/sitter in room  OT Visit Diagnosis: Unsteadiness on feet (R26.81)                Time: 9147-8295 OT Time Calculation (min): 21 min Charges:  OT General Charges $OT Visit: 1 Visit OT Evaluation $OT Eval Moderate Complexity: 1 Mod  03/25/2023  RP, OTR/L  Acute Rehabilitation Services  Office:  [phone]   Suzanna Obey 03/25/2023, 12:19 PM

## 2023-03-25 NOTE — ED Notes (Signed)
ED TO INPATIENT HANDOFF REPORT  ED Nurse Name and Phone #: Osvaldo Shipper RN [phone]  S Name/Age/Gender Yvonne [name] 82 y.o. female Room/Bed: 009C/009C  Code Status   Code Status: Full Code  Home/SNF/Other Home Patient oriented to: self, place, time, and situation Is this baseline? Yes   Triage Complete: Triage complete  Chief Complaint Dizziness [R42]  Triage Note PT came in POV for Dizziness (room spinning) and 7/10 pain in right leg beginning this AM.  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 Pt states she still has baseline weakness and tingling to right arm and leg. NO facial droop or slurred speech.  Pt moving all extremities freely.  Pt has hx of left side breast cancer.     Allergies No Known Allergies  Level of Care/Admitting Diagnosis ED Disposition     ED Disposition  Admit   Condition  --   Comment  Hospital Area: [facility] [[id number]]  Level of Care: Telemetry Medical [104]  May place patient in observation at [facility] or [facility] if equivalent level of care is available:: Yes  Covid Evaluation: Asymptomatic - no recent exposure (last 10 days) testing not required  Diagnosis: Dizziness [914782]  Admitting Physician: Alfredo Martinez [[id number]]  Attending Physician: Caro Laroche [[id number]]          B Medical/Surgery History Past Medical History:  Diagnosis Date   Anemia    Anxiety    Arthritis    Bilateral carotid artery disease (HCC) 02/04/2017   Carotid US 3/22: R 1-39; L 100   Depression, recurrent (HCC) 02/10/2019   Hypertension    Hypothyroidism    Impaired functional mobility, balance, gait, and endurance 09/02/2015   Malignant neoplasm of lower-outer quadrant of left breast of female, estrogen receptor positive (HCC) 04/24/2021   Mild cognitive impairment with memory loss 09/02/2015   Osteopenia    Pre-diabetes    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 Clifton James & Dr. Laneta Simmers   Severe aortic stenosis    Stroke [facility])    pt was 15   Past Surgical History:  Procedure Laterality Date   ABDOMINAL HYSTERECTOMY     BACK SURGERY     2019   BRAIN SURGERY     age 31's- steel plate in back of head   BREAST LUMPECTOMY WITH RADIOACTIVE SEED AND SENTINEL LYMPH NODE BIOPSY Left 05/17/2021   Procedure: LEFT BREAST SEED LOCALIZED LUMPECTOMY (BRACKETED) WITH SENTINEL LYMPH NODE BIOPSY;  Surgeon: Harriette Bouillon, MD;  Location: [facility] OR;  Service: General;  Laterality: Left;  PEC BLOCK 90 MINUTES ROOM 9   CATARACT EXTRACTION Bilateral    INTRAOPERATIVE TRANSTHORACIC ECHOCARDIOGRAM N/A 06/26/2022   Procedure: INTRAOPERATIVE TRANSTHORACIC ECHOCARDIOGRAM;  Surgeon: Kathleene Hazel, MD;  Location: [facility] OR;  Service: Open Heart Surgery;  Laterality: N/A;   RE-EXCISION OF BREAST LUMPECTOMY Left 06/08/2021   Procedure: RE-EXCISION OF LEFT BREAST LUMPECTOMY;  Surgeon: Harriette Bouillon, MD;  Location: [facility] ORS;  Service: General;  Laterality: Left;   RIGHT HEART CATH AND CORONARY ANGIOGRAPHY N/A 05/17/2022   Procedure: RIGHT HEART CATH AND CORONARY ANGIOGRAPHY;  Surgeon: Kathleene Hazel, MD;  Location: [facility] INVASIVE CV LAB;  Service: Cardiovascular;  Laterality: N/A;   TOE AMPUTATION  2013   2nd toe on right foot, hammer toe   TONSILLECTOMY     TOOTH EXTRACTION     all  teeth removed   TOTAL HIP ARTHROPLASTY Left 03/19/2017   Procedure: LEFT TOTAL HIP ARTHROPLASTY ANTERIOR APPROACH;  Surgeon: Kathryne Hitch, MD;  Location: [facility] OR;  Service: Orthopedics;  Laterality: Left;   TRANSCATHETER AORTIC VALVE REPLACEMENT, TRANSFEMORAL N/A 06/26/2022   Procedure: TRANSCATHETR AORTIC VALVE REPLACEMENT, TRANSFEMORAL;  Surgeon: Kathleene Hazel, MD;  Location: [facility] OR;  Service: Open Heart Surgery;  Laterality: N/A;   ULTRASOUND GUIDANCE FOR VASCULAR ACCESS Bilateral 06/26/2022   Procedure: ULTRASOUND GUIDANCE FOR VASCULAR  ACCESS;  Surgeon: Kathleene Hazel, MD;  Location: [facility] OR;  Service: Open Heart Surgery;  Laterality: Bilateral;     A IV Location/Drains/Wounds Patient Lines/Drains/Airways Status     Active Line/Drains/Airways     Name Placement date Placement time Site Days   Peripheral IV 03/24/23 20 G Left;Posterior Forearm 03/24/23  2111  Forearm  1            Intake/Output Last 24 hours No intake or output data in the 24 hours ending 03/25/23 0801  Labs/Imaging Results for orders placed or performed during the hospital encounter of 03/24/23 (from the past 48 hour(s))  CBG monitoring, ED     Status: Abnormal   Collection Time: 03/24/23 11:58 AM  Result Value Ref Range   Glucose-Capillary 146 (H) 70 - 99 mg/dL    Comment: Glucose reference range applies only to samples taken after fasting for at least 8 hours.  Basic metabolic panel     Status: Abnormal   Collection Time: 03/24/23 12:04 PM  Result Value Ref Range   Sodium 138 135 - 145 mmol/L   Potassium 3.9 3.5 - 5.1 mmol/L   Chloride 102 98 - 111 mmol/L   CO2 25 22 - 32 mmol/L   Glucose, Bld 148 (H) 70 - 99 mg/dL    Comment: Glucose reference range applies only to samples taken after fasting for at least 8 hours.   BUN 15 8 - 23 mg/dL   Creatinine, Ser 1.76 0.44 - 1.00 mg/dL   Calcium 9.0 8.9 - 16.0 mg/dL   GFR, Estimated >73 >71 mL/min    Comment: (NOTE) Calculated using the CKD-EPI Creatinine Equation (2021)    Anion gap 11 5 - 15    Comment: Performed at [facility], [address], [place], [place] [address]  CBC     Status: None   Collection Time: 03/24/23 12:04 PM  Result Value Ref Range   WBC 8.6 4.0 - 10.5 K/uL   RBC 4.44 3.87 - 5.11 MIL/uL   Hemoglobin 13.2 12.0 - 15.0 g/dL   HCT 48.5 46.2 - 70.3 %   MCV 90.8 80.0 - 100.0 fL   MCH 29.7 26.0 - 34.0 pg   MCHC 32.8 30.0 - 36.0 g/dL   RDW 50.0 93.8 - 18.2 %   Platelets 250 150 - 400 K/uL   nRBC 0.0 0.0 - 0.2 %    Comment: Performed at [facility], [address] [place], [place] [address]  CBG monitoring, ED     Status: Abnormal   Collection Time: 03/24/23  9:08 PM  Result Value Ref Range   Glucose-Capillary 106 (H) 70 - 99 mg/dL    Comment: Glucose reference range applies only to samples taken after fasting for at least 8 hours.  CBG monitoring, ED     Status: None   Collection Time: 03/24/23 11:12 PM  Result Value Ref Range   Glucose-Capillary 97 70 - 99 mg/dL    Comment:  Glucose reference range applies only to samples taken after fasting for at least 8 hours.  Lipid panel     Status: Abnormal   Collection Time: 03/25/23  1:28 AM  Result Value Ref Range   Cholesterol 126 0 - 200 mg/dL   Triglycerides 161 (H) <150 mg/dL   HDL 40 (L) >09 mg/dL   Total CHOL/HDL Ratio 3.2 RATIO   VLDL 34 0 - 40 mg/dL   LDL Cholesterol 52 0 - 99 mg/dL    Comment:        Total Cholesterol/HDL:CHD Risk Coronary Heart Disease Risk Table                     Men   Women  1/2 Average Risk   3.4   3.3  Average Risk       5.0   4.4  2 X Average Risk   9.6   7.1  3 X Average Risk  23.4   11.0        Use the calculated Patient Ratio above and the CHD Risk Table to determine the patient's CHD Risk.        ATP III CLASSIFICATION (LDL):  <100     mg/dL   Optimal  604-540  mg/dL   Near or Above                    Optimal  130-159  mg/dL   Borderline  981-191  mg/dL   High  >478     mg/dL   Very High Performed at [facility], [address] [place], [place] [address]   Hemoglobin A1c     Status: Abnormal   Collection Time: 03/25/23  1:28 AM  Result Value Ref Range   Hgb A1c MFr Bld 7.4 (H) 4.8 - 5.6 %    Comment: (NOTE) Pre diabetes:          5.7%-6.4%  Diabetes:              >6.4%  Glycemic control for   <7.0% adults with diabetes    Mean Plasma Glucose 165.68 mg/dL    Comment: Performed at [facility], [address], [place], [place] [address]  TSH     Status: None   Collection Time: 03/25/23  1:28 AM  Result  Value Ref Range   TSH 1.868 0.350 - 4.500 uIU/mL    Comment: Performed by a 3rd Generation assay with a functional sensitivity of <=0.01 uIU/mL. Performed at [facility], [address] [place], [place] [address]   CBC     Status: None   Collection Time: 03/25/23  1:28 AM  Result Value Ref Range   WBC 9.8 4.0 - 10.5 K/uL   RBC 4.39 3.87 - 5.11 MIL/uL   Hemoglobin 12.7 12.0 - 15.0 g/dL   HCT 96.2 95.2 - 84.1 %   MCV 88.6 80.0 - 100.0 fL   MCH 28.9 26.0 - 34.0 pg   MCHC 32.6 30.0 - 36.0 g/dL   RDW 32.4 40.1 - 02.7 %   Platelets 216 150 - 400 K/uL   nRBC 0.0 0.0 - 0.2 %    Comment: Performed at [facility], [address], [place], [place] [address]  Comprehensive metabolic panel     Status: Abnormal   Collection Time: 03/25/23  1:28 AM  Result Value Ref Range   Sodium 140 135 - 145 mmol/L   Potassium 3.3 (L)  3.5 - 5.1 mmol/L   Chloride 104 98 - 111 mmol/L   CO2 25 22 - 32 mmol/L   Glucose, Bld 118 (H) 70 - 99 mg/dL    Comment: Glucose reference range applies only to samples taken after fasting for at least 8 hours.   BUN 11 8 - 23 mg/dL   Creatinine, Ser 1.61 0.44 - 1.00 mg/dL   Calcium 8.5 (L) 8.9 - 10.3 mg/dL   Total Protein 6.4 (L) 6.5 - 8.1 g/dL   Albumin 3.2 (L) 3.5 - 5.0 g/dL   AST 31 15 - 41 U/L   ALT 25 0 - 44 U/L   Alkaline Phosphatase 47 38 - 126 U/L   Total Bilirubin 0.4 0.3 - 1.2 mg/dL   GFR, Estimated >09 >60 mL/min    Comment: (NOTE) Calculated using the CKD-EPI Creatinine Equation (2021)    Anion gap 11 5 - 15    Comment: Performed at [facility], [address], [place], [place] [address]  Urinalysis, Routine w reflex microscopic -Urine, Clean Catch     Status: Abnormal   Collection Time: 03/25/23  5:19 AM  Result Value Ref Range   Color, Urine YELLOW YELLOW   APPearance HAZY (A) CLEAR   Specific Gravity, Urine 1.011 1.005 - 1.030   pH 7.0 5.0 - 8.0   Glucose, UA NEGATIVE NEGATIVE mg/dL   Hgb urine dipstick NEGATIVE NEGATIVE    Bilirubin Urine NEGATIVE NEGATIVE   Ketones, ur NEGATIVE NEGATIVE mg/dL   Protein, ur NEGATIVE NEGATIVE mg/dL   Nitrite NEGATIVE NEGATIVE   Leukocytes,Ua LARGE (A) NEGATIVE   RBC / HPF 0-5 0 - 5 RBC/hpf   WBC, UA >50 0 - 5 WBC/hpf   Bacteria, UA RARE (A) NONE SEEN   Squamous Epithelial / HPF 0-5 0 - 5 /HPF    Comment: Performed at [facility], [address] [place], [place] [address]  CBG monitoring, ED     Status: Abnormal   Collection Time: 03/25/23  5:22 AM  Result Value Ref Range   Glucose-Capillary 172 (H) 70 - 99 mg/dL    Comment: Glucose reference range applies only to samples taken after fasting for at least 8 hours.  CBG monitoring, ED     Status: None   Collection Time: 03/25/23  7:55 AM  Result Value Ref Range   Glucose-Capillary 90 70 - 99 mg/dL    Comment: Glucose reference range applies only to samples taken after fasting for at least 8 hours.   CT Head Wo Contrast  Result Date: 03/24/2023 CLINICAL DATA:  Neuro deficit, acute, stroke suspected. New onset dizziness EXAM: CT HEAD WITHOUT CONTRAST TECHNIQUE: Contiguous axial images were obtained from the base of the skull through the vertex without intravenous contrast. RADIATION DOSE REDUCTION: This exam was performed according to the departmental dose-optimization program which includes automated exposure control, adjustment of the mA and/or kV according to patient size and/or use of iterative reconstruction technique. COMPARISON:  CT head without contrast 08/01/16 FINDINGS: Brain: A remote infarct is again noted within the left insular cortex and superior temporal gyrus. Wallerian degeneration is noted within the left cerebral peduncle, stable. No acute cortical infarct is present. Diffuse periventricular and subcortical white matter hypoattenuation is moderately advanced for age. The deep brain nuclei are within normal limits. The ventricles are proportionate to the degree of atrophy. A wedge-shaped infarct in the  posterior right cerebellum is new since the prior study and may be acute. The brainstem and  cerebellum are otherwise unremarkable. Midline structures are within normal limits. Vascular: Atherosclerotic calcifications are present within the cavernous right internal carotid artery and at the dural margin of both vertebral arteries, similar the prior study. No hyperdense vessel is present. Skull: Left temporal craniotomy is stable. Calvarium is otherwise intact. The extracranial soft tissues are within normal limits. Sinuses/Orbits: Chronic wall thickening is present in both maxillary sinuses. Minimal mucosal thickening is present in the inferior maxillary sinuses. Mild mucosal thickening is present within the ethmoid air cells. No fluid levels are present. The mastoid air cells are clear bilaterally. Bilateral lens replacements are noted. Globes and orbits are otherwise unremarkable. IMPRESSION: 1. Wedge-shaped infarct in the posterior right cerebellum is new since the prior study and may be acute. This corresponds to the patient's acute symptoms. 2. Stable remote infarct of the left insular cortex and superior temporal gyrus. 3. Stable atrophy and white matter disease. This likely reflects the sequela of chronic microvascular ischemia. 4. Stable left temporal craniotomy. Critical Value/emergent results were called by telephone at the time of interpretation on 03/24/2023 at 4:21 pm to provider Dr. Eber Hong, who verbally acknowledged these results. Electronically Signed   By: Marin Roberts M.D.   On: 03/24/2023 16:22   VAS [place] LOWER EXTREMITY VENOUS (DVT) (7a-7p)  Result Date: 03/24/2023  Lower Venous DVT Study Patient Name:  Yvonne [name]  Date of Exam:   03/24/2023 Medical Rec #: [id number]       Accession #:    [id number] Date of Birth: 01/04/1941      Patient Gender: F Patient Age:   7 years Exam Location:  [facility] Procedure:      VAS [place] LOWER EXTREMITY VENOUS (DVT) Referring Phys:  Lyn Hollingshead SCHUTT --------------------------------------------------------------------------------  Indications: Pain.  Risk Factors: None identified. Comparison Study: No prior studies. Performing Technologist: Chanda Busing RVT  Examination Guidelines: A complete evaluation includes B-mode imaging, spectral Doppler, color Doppler, and power Doppler as needed of all accessible portions of each vessel. Bilateral testing is considered an integral part of a complete examination. Limited examinations for reoccurring indications may be performed as noted. The reflux portion of the exam is performed with the patient in reverse Trendelenburg.  +---------+---------------+---------+-----------+----------+--------------+ RIGHT    CompressibilityPhasicitySpontaneityPropertiesThrombus Aging +---------+---------------+---------+-----------+----------+--------------+ CFV      Full           Yes      Yes                                 +---------+---------------+---------+-----------+----------+--------------+ SFJ      Full                                                        +---------+---------------+---------+-----------+----------+--------------+ FV Prox  Full                                                        +---------+---------------+---------+-----------+----------+--------------+ FV Mid   Full                                                        +---------+---------------+---------+-----------+----------+--------------+  FV DistalFull                                                        +---------+---------------+---------+-----------+----------+--------------+ PFV      Full                                                        +---------+---------------+---------+-----------+----------+--------------+ POP      Full           Yes      Yes                                 +---------+---------------+---------+-----------+----------+--------------+ PTV       Full                                                        +---------+---------------+---------+-----------+----------+--------------+ PERO     Full                                                        +---------+---------------+---------+-----------+----------+--------------+   +----+---------------+---------+-----------+----------+--------------+ LEFTCompressibilityPhasicitySpontaneityPropertiesThrombus Aging +----+---------------+---------+-----------+----------+--------------+ CFV Full           Yes      Yes                                 +----+---------------+---------+-----------+----------+--------------+     Summary: RIGHT: - There is no evidence of deep vein thrombosis in the lower extremity.  - No cystic structure found in the popliteal fossa.  LEFT: - No evidence of common femoral vein obstruction.   *See table(s) above for measurements and observations.    Preliminary     Pending Labs Unresulted Labs (From admission, onward)    None       Vitals/Pain Today's Vitals   03/25/23 0201 03/25/23 0501 03/25/23 0515 03/25/23 0630  BP: (!) 164/99 (!) 172/64 (!) 172/64 135/68  Pulse: 64 60 60 (!) 57  Resp: 16 14 18 11   Temp: 97.7 F (36.5 C) 98.4 F (36.9 C) 98.2 F (36.8 C)   TempSrc: Oral Oral Oral   SpO2: 94% 95% 95% 95%  Weight:      Height:      PainSc:   0-No pain     Isolation Precautions No active isolations  Medications Medications   stroke: early stages of recovery book (has no administration in time range)  enoxaparin (LOVENOX) injection 40 mg (has no administration in time range)  aspirin chewable tablet 81 mg (has no administration in time range)  atorvastatin (LIPITOR) tablet 40 mg (has no administration in time range)  gabapentin (NEURONTIN) capsule 200 mg (200 mg Oral Given 03/24/23 2115)  gabapentin (NEURONTIN) capsule 100 mg (has no administration in time  range)  levothyroxine (SYNTHROID) tablet 88 mcg (88 mcg Oral Given 03/25/23  0607)  tamoxifen (NOLVADEX) tablet 20 mg (has no administration in time range)  insulin aspart (novoLOG) injection 0-9 Units ( Subcutaneous Not Given 03/25/23 0801)  acetaminophen (TYLENOL) tablet 1,000 mg (1,000 mg Oral Given 03/25/23 0701)  potassium chloride (KLOR-CON) packet 40 mEq (40 mEq Oral Given 03/25/23 0527)  acetaminophen (TYLENOL) tablet 650 mg (650 mg Oral Given 03/24/23 1333)    Mobility walks with person assist     Focused Assessments Cardiac Assessment Handoff:    No results found for: "CKTOTAL", "CKMB", "CKMBINDEX", "TROPONINI" No results found for: "DDIMER" Does the Patient currently have chest pain? No    R Recommendations: See Admitting Provider Note  Report given to:   Additional Notes: dizziness

## 2023-03-25 NOTE — ED Notes (Signed)
NAD noted upon transport to ECHO.

## 2023-03-25 NOTE — Plan of Care (Signed)
Problem: Education: Goal: Understanding of CV disease, CV risk reduction, and recovery process will improve Outcome: Progressing Goal: Individualized Educational Video(s) Outcome: Progressing   Problem: Activity: Goal: Ability to return to baseline activity level will improve Outcome: Progressing   Problem: Cardiovascular: Goal: Ability to achieve and maintain adequate cardiovascular perfusion will improve Outcome: Progressing Goal: Vascular access site(s) Level 0-1 will be maintained Outcome: Progressing   Problem: Health Behavior/Discharge Planning: Goal: Ability to safely manage health-related needs after discharge will improve Outcome: Progressing   Problem: Education: Goal: Knowledge of disease or condition will improve Outcome: Progressing Goal: Knowledge of secondary prevention will improve (MUST DOCUMENT ALL) Outcome: Progressing Goal: Knowledge of patient specific risk factors will improve Elta [place] N/A or DELETE if not current risk factor) Outcome: Progressing   Problem: Ischemic Stroke/TIA Tissue Perfusion: Goal: Complications of ischemic stroke/TIA will be minimized Outcome: Progressing   Problem: Coping: Goal: Will verbalize positive feelings about self Outcome: Progressing Goal: Will identify appropriate support needs Outcome: Progressing   Problem: Health Behavior/Discharge Planning: Goal: Ability to manage health-related needs will improve Outcome: Progressing Goal: Goals will be collaboratively established with patient/family Outcome: Progressing   Problem: Self-Care: Goal: Ability to participate in self-care as condition permits will improve Outcome: Progressing Goal: Verbalization of feelings and concerns over difficulty with self-care will improve Outcome: Progressing Goal: Ability to communicate needs accurately will improve Outcome: Progressing   Problem: Nutrition: Goal: Risk of aspiration will decrease Outcome: Progressing Goal: Dietary  intake will improve Outcome: Progressing   Problem: Education: Goal: Ability to describe self-care measures that may prevent or decrease complications (Diabetes Survival Skills Education) will improve Outcome: Progressing Goal: Individualized Educational Video(s) Outcome: Progressing   Problem: Coping: Goal: Ability to adjust to condition or change in health will improve Outcome: Progressing   Problem: Fluid Volume: Goal: Ability to maintain a balanced intake and output will improve Outcome: Progressing   Problem: Health Behavior/Discharge Planning: Goal: Ability to identify and utilize available resources and services will improve Outcome: Progressing Goal: Ability to manage health-related needs will improve Outcome: Progressing   Problem: Metabolic: Goal: Ability to maintain appropriate glucose levels will improve Outcome: Progressing   Problem: Nutritional: Goal: Maintenance of adequate nutrition will improve Outcome: Progressing Goal: Progress toward achieving an optimal weight will improve Outcome: Progressing   Problem: Skin Integrity: Goal: Risk for impaired skin integrity will decrease Outcome: Progressing   Problem: Tissue Perfusion: Goal: Adequacy of tissue perfusion will improve Outcome: Progressing   Problem: Education: Goal: Knowledge of General Education information will improve Description: Including pain rating scale, medication(s)/side effects and non-pharmacologic comfort measures Outcome: Progressing   Problem: Health Behavior/Discharge Planning: Goal: Ability to manage health-related needs will improve Outcome: Progressing   Problem: Clinical Measurements: Goal: Ability to maintain clinical measurements within normal limits will improve Outcome: Progressing Goal: Will remain free from infection Outcome: Progressing Goal: Diagnostic test results will improve Outcome: Progressing Goal: Respiratory complications will improve Outcome:  Progressing Goal: Cardiovascular complication will be avoided Outcome: Progressing   Problem: Activity: Goal: Risk for activity intolerance will decrease Outcome: Progressing   Problem: Nutrition: Goal: Adequate nutrition will be maintained Outcome: Progressing   Problem: Coping: Goal: Level of anxiety will decrease Outcome: Progressing   Problem: Elimination: Goal: Will not experience complications related to bowel motility Outcome: Progressing Goal: Will not experience complications related to urinary retention Outcome: Progressing   Problem: Pain Managment: Goal: General experience of comfort will improve Outcome: Progressing   Problem: Safety: Goal: Ability to remain free from injury  will improve Outcome: Progressing   Problem: Skin Integrity: Goal: Risk for impaired skin integrity will decrease Outcome: Progressing

## 2023-03-25 NOTE — Discharge Instructions (Addendum)
It was a pleasure taking care of you here at [facility].  We are so glad that you are feeling better.  While you were here we tested you for several causes of dizziness.  The most likely cause of your dizziness is something called orthostatic hypotension.  This means that when you go from sitting to standing or lying down to standing your blood pressure drops which can make you feel dizzy or faint and could even cause you to pass out in some circumstances.  To prevent this in the future it is important to get up slowly to allow your body to catch up when you change position.  You should also try and drink plenty of water to stay hydrated as this will also help your body keep your blood pressure where it should be.  We also set up a service for you called home health physical therapy which will help you work on balance and training your body to help manage your dizziness.  It is also possible that your dizziness is related to previous strokes, even though you did not have a stroke when you came in.  We are referring you to see neurology outside of the hospital to have that looked into.   Please continue taking your home medications with the following changes:  If you experience any of the following please come back to the hospital: -Chest pain -shortness of breath -New or worsening weakness -Loss of feeling in 1 or more limbs -Facial drooping or saggy smile -Loss of consciousness or falls resulting in injury

## 2023-03-25 NOTE — Evaluation (Signed)
Physical Therapy Evaluation Patient Details Name: Yvonne [name] MRN: [id number] DOB: 12-10-1940 Today's Date: 03/25/2023  History of Present Illness  82 y.o. female admitted 03/24/23 with dizziness and R LE pain  PMH includes: HTN, CVA, L THA.  CT head positive for infarct in posterior right cerebellum (age indeterminate,) remote infarct of L insular cortex and superior temporal gyrus and L temporal craniotomy.  Clinical Impression  Patient presents with R LE decreased coordination and pain.  She relates has had symptoms in that leg since her stroke years ago, but difficulty determining based on her report if her gait is much different than baseline.  She does report mainly using cane or furniture walking in the home and falls usually twice a month, though no falls in about a month.  She describes dizziness as light headedness and was negative for testing for BPPV or peripheral vertigo so likely due to cerebellar infarct (whether new or old).  She is a high fall risk needing hand hold assistance today in hallway since her cane was lost.  Encouraged to use walker at all times for fall prevention at d/c and recommend follow up HHPT.  PT will follow up during acute stay.    Vestibular Assessment - 03/25/23 0001       Symptom Behavior   Subjective history of current problem Reports dizziness starting last evening and has had dizziness in the past.    Type of Dizziness  Lightheadedness    Frequency of Dizziness intermittent    Duration of Dizziness minutes/hours    Symptom Nature Variable;Intermittent;Positional    Aggravating Factors Activity in general;Supine to sit;Sit to stand;Comment   worse in evenings   Relieving Factors Rest;Slow movements    Progression of Symptoms No change since onset    History of similar episodes Does report history of episodes in the past, though not specific to duration or actual symptoms.      Oculomotor Exam   Oculomotor Alignment Abnormal   R eye lateral gaze  (pt reports history of a "lazy eye" denies diplopia, but does close one eye at times, per chart R eye blindness??)   Ocular ROM generally WFL with noted R eye deviations    Spontaneous Absent    Gaze-induced  Absent    Smooth Pursuits Intact    Saccades Intact;Overshoots   occasional overshooting     Oculomotor Exam-Fixation Suppressed    Left Head Impulse Patient not able to allow very quick head movement very guarded, but noted no specific refixation after attempted testing    Right Head Impulse see above      Vestibulo-Ocular Reflex   VOR 1 Head Only (x 1 viewing) intact with head nods and turns though pt needing consistent cues to keep eyes on target    VOR Cancellation Normal      Auditory   Comments about 10 year history of decreased hearing, now with hearing aides, states one is better than the other, but can not remember which one      Positional Testing   Sidelying Test Sidelying Right;Sidelying Left      Sidelying Right   Sidelying Right Duration 1 minute    Sidelying Right Symptoms No nystagmus      Sidelying Left   Sidelying Left Duration 1 minute    Sidelying Left Symptoms No nystagmus                    If plan is discharge home, recommend the following: A little  help with walking and/or transfers;Assistance with cooking/housework;Assist for transportation;A little help with bathing/dressing/bathroom;Help with stairs or ramp for entrance   Can travel by private vehicle        Equipment Recommendations None recommended by PT  Recommendations for Other Services       Functional Status Assessment Patient has had a recent decline in their functional status and demonstrates the ability to make significant improvements in function in a reasonable and predictable amount of time.     Precautions / Restrictions Precautions Precautions: Fall Precaution Comments: reports about 2 falls per month, though none in last month, R eye vision deficits (reports lazy  eye) Restrictions Weight Bearing Restrictions: No      Mobility  Bed Mobility Overal bed mobility: Needs Assistance Bed Mobility: Supine to Sit, Sit to Supine     Supine to sit: Contact guard, Used rails, HOB elevated Sit to supine: Min assist   General bed mobility comments: up to EOB with increased time and effortful, assist for balance; to supine assist for positioning during modified hallpike testing for BPPV, then transitioned to supine    Transfers Overall transfer level: Needs assistance Equipment used: None Transfers: Sit to/from Stand Sit to Stand: Contact guard assist           General transfer comment: up from tall stretcher no device with A for balance, up from toilet in bathroom and armchair in the room with S    Ambulation/Gait Ambulation/Gait assistance: Min assist Gait Distance (Feet): 200 Feet Assistive device: 1 person hand held assist Gait Pattern/deviations: Step-to pattern, Step-through pattern, Decreased stride length, Wide base of support, Steppage, Antalgic       General Gait Details: mild steppage with decreased DF on R during ambulation and mild antalgia; some abnormal patterns keeping leg externally rotated and using hip adduction to progress the leg  Stairs            Wheelchair Mobility     Tilt Bed    Modified Rankin (Stroke Patients Only) Modified Rankin (Stroke Patients Only) Pre-Morbid Rankin Score: Moderate disability Modified Rankin: Moderately severe disability     Balance Overall balance assessment: Needs assistance Sitting-balance support: Feet supported Sitting balance-Leahy Scale: Good       Standing balance-Leahy Scale: Fair Standing balance comment: able to stand without UE support, though usually holds onto cane or furniture                             Pertinent Vitals/Pain Pain Assessment Faces Pain Scale: Hurts even more Pain Location: R leg from heel to thigh Pain Descriptors /  Indicators: Sharp, Shooting Pain Intervention(s): Monitored during session, Repositioned    Home Living Family/patient expects to be discharged to:: Private residence Living Arrangements: Alone Available Help at Discharge: Personal care attendant;Available PRN/intermittently Type of Home: House Home Access: Stairs to enter Entrance Stairs-Rails: Left;Right;Can reach both Entrance Stairs-Number of Steps: 4-5   Home Layout: One level Home Equipment: Rollator (4 wheels);Cane - single point;Hand held shower head;Grab bars - tub/shower;Shower seat      Prior Function Prior Level of Function : Needs assist             Mobility Comments: Uses a cane versus furniture walks in the home; does not get up much after 6 pm feeling more unsteady ADLs Comments: Able to complete her own ADL, assist with medications, community mobility, light meal prep and light home management via PCA 2 hours/day.  Extremity/Trunk Assessment   Upper Extremity Assessment Upper Extremity Assessment: Defer to OT evaluation    Lower Extremity Assessment Lower Extremity Assessment: RLE deficits/detail;LLE deficits/detail RLE Deficits / Details: difficulty coordinating toes up for ankle DF noting more pronation with ankle DF; knee extension coupled with hip flexion despite cues overall strength 4/5; reports numbness, shooting pain though had numbness after stroke many years ago RLE Sensation: decreased light touch RLE Coordination: decreased gross motor;decreased fine motor LLE Deficits / Details: AROM and strength WFL, 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  Very HOH        General Comments General comments (skin integrity, edema, etc.): Reports her cane lost since here in ED; discussed using walker at home at all times for fall prevention    Exercises     Assessment/Plan    PT Assessment Patient needs continued PT services  PT Problem List Decreased balance;Pain;Decreased mobility;Decreased coordination;Decreased knowledge of use of DME       PT Treatment Interventions DME instruction;Functional mobility training;Balance training;Therapeutic activities;Gait training;Stair training;Therapeutic exercise;Patient/family education    PT Goals (Current goals can be found in the Care Plan section)  Acute Rehab PT Goals Patient Stated Goal: to return home PT Goal Formulation: With patient Time For Goal Achievement: 04/08/23 Potential to Achieve Goals: Good    Frequency Min 1X/week     Co-evaluation               AM-PAC PT "6 Clicks" Mobility  Outcome Measure Help needed turning from your back to your side while in a flat bed without using bedrails?: A Little Help needed moving from lying on your back to sitting on the side of a flat bed without using bedrails?: A Little Help needed moving to and from a bed to a chair (including a wheelchair)?: A Little Help needed standing up from a chair using your arms (e.g., wheelchair or bedside chair)?: A Little Help needed to walk in hospital room?: A Little Help needed climbing 3-5 steps with a railing? : Total 6 Click Score: 16    End of Session Equipment Utilized During Treatment: Gait belt Activity Tolerance: Patient tolerated treatment well Patient left: in bed;with call bell/phone within reach   PT Visit Diagnosis: Other abnormalities of gait and mobility (R26.89);History of falling (Z91.81);Other symptoms and signs involving the nervous system (R29.898)    Time:  1610-9604 PT Time Calculation (min) (ACUTE ONLY): 45 min   Charges:   PT Evaluation $PT Eval Moderate Complexity: 1 Mod PT Treatments $Gait Training: 8-22 mins $Neuromuscular Re-education: 8-22 mins PT General Charges $$ ACUTE PT VISIT: 1 Visit         Sheran Lawless, PT Acute Rehabilitation Services Office:[phone] 03/25/2023   Elray Mcgregor 03/25/2023, 2:19 PM

## 2023-03-25 NOTE — ED Notes (Signed)
PT up to bathroom with x1 assist, stable gait.

## 2023-03-25 NOTE — ED Notes (Addendum)
Pt up to bathroom in wheel chair, stood and pivot with minimal walking- stable gait.

## 2023-03-25 NOTE — Care Management Obs Status (Signed)
MEDICARE OBSERVATION STATUS NOTIFICATION   Patient Details  Name: IMYA MANCE MRN: [id number] Date of Birth: Aug 05, 1940   Medicare Observation Status Notification Given:  Yes    Ronny Bacon, RN 03/25/2023, 2:00 PM

## 2023-03-25 NOTE — TOC Initial Note (Signed)
Transition of Care [facility]) - Initial/Assessment Note    Patient Details  Name: Yvonne [name] MRN: [id number] Date of Birth: 1940-11-12  Transition of Care [facility]) CM/SW Contact:    Ronny Bacon, RN Phone Number: 03/25/2023, 4:52 PM  Clinical Narrative:  Spoke with patient about home health PT. Patient would like to use previous Home health agency, [facility]. Kelly with [facility] able to accept patient for [facility] services. Information placed on AVS.                       Patient Goals and CMS Choice            Expected Discharge Plan and Services                                   HH Arranged: PT HH Agency: [facility] Date [facility] Agency Contacted: 03/25/23 Time HH Agency Contacted: 1651 Representative spoke with at [facility] Agency: Tresa Endo  Prior Living Arrangements/Services                       Activities of Daily Living   ADL Screening (condition at time of admission) Independently performs ADLs?: Yes (appropriate for developmental age) Is the patient deaf or have difficulty hearing?: Yes Does the patient have difficulty seeing, even when wearing glasses/contacts?: No Does the patient have difficulty concentrating, remembering, or making decisions?: No  Permission Sought/Granted                  Emotional Assessment              Admission diagnosis:  Dizziness [R42] Bradycardia [R00.1] Primary hypertension [I10] Right leg pain [M79.604] Prediabetes [R73.03] Cerebrovascular accident (CVA), unspecified mechanism (HCC) [I63.9] Patient Active Problem List   Diagnosis Date Noted   Dizziness 03/24/2023   SVT (supraventricular tachycardia) (HCC) 12/17/2022   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   Aneurysm of thoracic aorta (HCC) 06/04/2022   Pulmonary nodule 06/04/2022   Thyroid nodule 06/04/2022   Malignant neoplasm of lower-outer quadrant of left breast of  female, estrogen receptor positive (HCC) 04/24/2021   Controlled type 2 diabetes mellitus with chronic kidney disease, without long-term current use of insulin (HCC) 11/27/2019   Memory impairment 08/31/2019   Depression, recurrent (HCC) 02/10/2019   Degenerative spondylolisthesis 06/06/2018   Sensorineural hearing loss (SNHL), 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   Bilateral carotid artery disease (HCC) 02/04/2017   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   Osteopenia 08/30/2014   Severe aortic stenosis 12/12/2009   Hypothyroidism 07/12/2009   INSOMNIA, PERSISTENT 07/12/2009   Essential hypertension 07/12/2009   PCP:  System, Provider Not In Pharmacy:   [id number] - [place] - [address] [place] [address] Phone: [phone] Fax: [phone]  [facility] Not Mail Employee Only Now [facility] - [place], [place] - [place] [address] [place] [address] Phone: [phone] Fax: [phone]  [facility] - [place], [place] - [address] [place] [address] Phone: [phone] Fax: [phone]  [facility] (Now [facility]) - [address] [place], [place] - [address] [place] [address] Phone: [phone] Fax: [phone]     Social Determinants of Health (SDOH) Social History: SDOH Screenings  Food Insecurity: No Food Insecurity (03/25/2023)  Housing: Low Risk  (03/25/2023)  Transportation Needs: No Transportation Needs (03/25/2023)  Utilities: Not At Risk (03/25/2023)  Depression (PHQ2-9): Medium Risk (04/30/2022)  Financial Resource Strain: Low Risk  (06/23/2021)  Physical Activity: Unknown (06/07/2018)  Social Connections: Moderately Integrated (06/23/2021)  Stress: No Stress Concern Present (06/23/2021)  Tobacco Use: Low Risk  (03/25/2023)   SDOH Interventions:     Readmission Risk Interventions     06/27/2022   12:10 PM  Readmission Risk Prevention Plan  Post Dischage Appt Complete  Medication Screening Complete  Transportation Screening Complete

## 2023-03-25 NOTE — Progress Notes (Signed)
VASCULAR LAB    Carotid duplex has been performed.  See CV proc for preliminary results.   Mana Morison, RVT 03/25/2023, 8:58 AM

## 2023-03-26 ENCOUNTER — Other Ambulatory Visit (HOSPITAL_COMMUNITY): Payer: Self-pay

## 2023-03-26 DIAGNOSIS — F339 Major depressive disorder, recurrent, unspecified: Secondary | ICD-10-CM

## 2023-03-26 DIAGNOSIS — I779 Disorder of arteries and arterioles, unspecified: Secondary | ICD-10-CM

## 2023-03-26 DIAGNOSIS — R7303 Prediabetes: Secondary | ICD-10-CM

## 2023-03-26 DIAGNOSIS — M79604 Pain in right leg: Secondary | ICD-10-CM

## 2023-03-26 DIAGNOSIS — I10 Essential (primary) hypertension: Secondary | ICD-10-CM

## 2023-03-26 DIAGNOSIS — R42 Dizziness and giddiness: Secondary | ICD-10-CM | POA: Diagnosis not present

## 2023-03-26 DIAGNOSIS — I639 Cerebral infarction, unspecified: Secondary | ICD-10-CM | POA: Diagnosis not present

## 2023-03-26 LAB — BASIC METABOLIC PANEL
Anion gap: 10 (ref 5–15)
BUN: 12 mg/dL (ref 8–23)
CO2: 26 mmol/L (ref 22–32)
Calcium: 8.7 mg/dL — ABNORMAL LOW (ref 8.9–10.3)
Chloride: 103 mmol/L (ref 98–111)
Creatinine, Ser: 0.71 mg/dL (ref 0.44–1.00)
GFR, Estimated: >60 mL/min (ref >60)
Glucose, Bld: 132 mg/dL — ABNORMAL HIGH (ref 70–99)
Potassium: 3.2 mmol/L — ABNORMAL LOW (ref 3.5–5.1)
Sodium: 139 mmol/L (ref 135–145)

## 2023-03-26 LAB — GLUCOSE, CAPILLARY
Glucose-Capillary: 135 mg/dL — ABNORMAL HIGH (ref 70–99)
Glucose-Capillary: 195 mg/dL — ABNORMAL HIGH (ref 70–99)
Glucose-Capillary: 130 mg/dL — ABNORMAL HIGH (ref 70–99)

## 2023-03-26 MED ORDER — IBUPROFEN 200 MG PO TABS
400.0000 mg | ORAL_TABLET | Freq: Four times a day (QID) | ORAL | Status: DC | PRN
  Administered 2023-03-26: 400 mg via ORAL
  Filled 2023-03-26: qty 2

## 2023-03-26 MED ORDER — AMITRIPTYLINE HCL 25 MG PO TABS
25.0000 mg | ORAL_TABLET | Freq: Every day | ORAL | 0 refills | Status: DC
  Filled 2023-03-26: qty 15, 15d supply, fill #0

## 2023-03-26 MED ORDER — LIDOCAINE 5 % EX PTCH
1.0000 | MEDICATED_PATCH | CUTANEOUS | Status: DC
  Administered 2023-03-26: 1 via TRANSDERMAL
  Filled 2023-03-26: qty 1

## 2023-03-26 MED ORDER — ATORVASTATIN CALCIUM 20 MG PO TABS
40.0000 mg | ORAL_TABLET | Freq: Every day | ORAL | 3 refills | Status: AC
  Filled 2023-03-26: qty 90, 45d supply, fill #0

## 2023-03-26 MED ORDER — MELATONIN 5 MG PO TABS: 5.0000 mg | ORAL_TABLET | Freq: Every evening | ORAL | Status: DC | PRN

## 2023-03-26 MED ORDER — LIDOCAINE 5 % EX PTCH
1.0000 | MEDICATED_PATCH | CUTANEOUS | 0 refills | Status: DC
  Filled 2023-03-26: qty 30, 30d supply, fill #0

## 2023-03-26 MED ORDER — ACETAMINOPHEN 500 MG PO TABS: 1000.0000 mg | ORAL_TABLET | Freq: Four times a day (QID) | ORAL | Status: AC | PRN

## 2023-03-26 MED ORDER — POTASSIUM CHLORIDE 20 MEQ PO PACK
40.0000 meq | PACK | Freq: Two times a day (BID) | ORAL | Status: DC
  Administered 2023-03-26: 40 meq via ORAL
  Filled 2023-03-26: qty 2

## 2023-03-26 NOTE — Assessment & Plan Note (Signed)
Age-indeterminate stroke seen on CT.  Wedge-shaped pattern in cerebellum. Neuro exam shows no new deficits, MRI cannot be done due to surgical clips. Gait observed by nursing staff with no concerns or deficits.  -Neuro has been consulted, appreciate recommendations - VAS Carotid Doppler: L CCA appears occluded, will need to compare to previous studies.

## 2023-03-26 NOTE — Discharge Summary (Addendum)
Family Medicine Teaching [facility] Discharge Summary  Patient name: Yvonne [name] Medical record number: [id number] Date of birth: Jun 01, 1941 Age: 82 y.o. Gender: female Date of Admission: 03/24/2023  Date of Discharge: 03/26/23 Admitting Physician: Alfredo Martinez, MD  Primary Care Provider: System, Provider Not In Consultants: Neurology  Indication for Hospitalization: Observation  Discharge Diagnoses/Problem List:  Principal Problem for Admission: New onset dizziness Other Problems addressed during stay:  Principal Problem:   Dizziness  Brief Hospital Course:  Yvonne [name] is a 82 y.o.female with a history of breast cancer on tamoxifen, brain tumor s/p craniotomy, DM2, TAVR, hypothyroidism, BL carotid disease, hearing difficulty, blind in right eye who was admitted to the [facility] Teaching Service at [facility] for dizziness and right leg pain. Her hospital course is detailed below:  Dizziness  Concern for CVA  Orthostasis Presented with dizziness and R leg pain. HDS. CT obtained which showed wedge-shaped infarct in posterior right cerebellum. Neurology consulted and recommended outpatient workup as infarct on CT is likely neither acute nor subacute, though patient admitted for observation. Risk strat labs unremarkable. ASA 81 mg started 10/7. US carotid with doppler was ordered and showed mild stenosis of the R ICA and occlusion of the L CCA. ECHO completed and showed L diastolic dysfunction with 70-75% EF. Patient had PT/OT eval which showed no BPPV or peripheral vertigo, though orthostatic vital signs were positive. Given overall improvement and negative stroke workup, patient was discharged with HHPT.  Other chronic conditions were medically managed with home medications and formulary alternatives as necessary. Antihypertensives were held inpatient.   PCP Follow-up Recommendations: Outpatient referral to neurology HHPT follow up Recommend discontinuing BB if possible due to low HR  and orthostasis. Please taper off elavil due to possible dizzying side effects. Please ensure she has stopped after 2 weeks. Consider starting back BP meds as tolerated  Disposition: Home with HHPT  Discharge Condition: Stable  Discharge Exam:  Vitals:   03/26/23 0757 03/26/23 1239  BP: 107/83 (!) 147/77  Pulse: 74 88  Resp: 18 18  Temp: 97.7 F (36.5 C) 98 F (36.7 C)  SpO2:  96%   General: Alert, oriented.  No obvious distress Heart: RRR, no R/G.  Systolic flow murmur heard patient has known aortic stenosis. Lungs: CTAB, no increased work of breathing. Neuro: Cranial nerves II through X intact.  Sensation intact bilaterally in all 4 limbs.  5 out of 5 strength in all 4 limbs.  Patient is able to follow commands with some difficulty due to known hearing loss.  Significant Procedures: None  Significant Labs and Imaging:  Recent Labs  Lab 03/25/23 0128  WBC 9.8  HGB 12.7  HCT 38.9  PLT 216   Recent Labs  Lab 03/25/23 0128 03/26/23 0519  NA 140 139  K 3.3* 3.2*  CL 104 103  CO2 25 26  GLUCOSE 118* 132*  BUN 11 12  CREATININE 0.68 0.71  CALCIUM 8.5* 8.7*  ALKPHOS 47  --   AST 31  --   ALT 25  --   ALBUMIN 3.2*  --    CT head without contrast  IMPRESSION: 1. Wedge-shaped infarct in the posterior right cerebellum is new since the prior study and may be acute. This corresponds to the patient's acute symptoms. 2. Stable remote infarct of the left insular cortex and superior temporal gyrus. 3. Stable atrophy and white matter disease. This likely reflects the sequela of chronic microvascular ischemia. 4. Stable left temporal craniotomy.  VAS  US Carotid   Summary:  Right Carotid: Velocities in the right ICA are consistent with a 1-39%  stenosis.   Left Carotid: The CCA appears occluded.   Vertebrals:  Bilateral vertebral arteries demonstrate antegrade flow.  Subclavians: Normal flow hemodynamics were seen in bilateral subclavian arteries.      Results/Tests Pending at Time of Discharge: None  Discharge Medications:  Allergies as of 03/26/2023   No Known Allergies      Medication List     STOP taking these medications    benazepril 40 MG tablet Commonly known as: LOTENSIN   hydrochlorothiazide 25 MG tablet Commonly known as: HYDRODIURIL   metoprolol tartrate 25 MG tablet Commonly known as: LOPRESSOR   nystatin powder Commonly known as: MYCOSTATIN/NYSTOP       TAKE these medications    acetaminophen 500 MG tablet Commonly known as: TYLENOL Take 2 tablets (1,000 mg total) by mouth every 6 (six) hours as needed for moderate pain, mild pain, fever or headache.   amitriptyline 25 MG tablet Commonly known as: ELAVIL Take 1 tablet (25 mg total) by mouth at bedtime. What changed:  medication strength how much to take   amLODipine 5 MG tablet Commonly known as: NORVASC Take 1 tablet (5 mg total) by mouth at bedtime.   aspirin 81 MG chewable tablet Chew 1 tablet (81 mg total) by mouth daily.   atorvastatin 20 MG tablet Commonly known as: LIPITOR Take 2 tablets (40 mg total) by mouth daily. What changed: how much to take   Centrum Silver Adult 50+ Tabs Take 1 tablet by mouth in the morning.   gabapentin 100 MG capsule Commonly known as: NEURONTIN TAKE 1 CAPSULE EVERY MORNING AND TAKE 2 CAPSULES EVERY NIGHT   levothyroxine 88 MCG tablet Commonly known as: SYNTHROID Take 1 tablet (88 mcg total) by mouth every morning. 30 minutes before food   lidocaine 5 % Commonly known as: LIDODERM Place 1 patch onto the skin daily. Remove & Discard patch within 12 hours or as directed by MD Start taking on: March 27, 2023   melatonin 5 MG Tabs Take 1 tablet (5 mg total) by mouth at bedtime as needed.   metFORMIN 500 MG tablet Commonly known as: GLUCOPHAGE Take 500 mg by mouth daily.   Osteo Bi-Flex Adv Triple St Tabs Take 1 tablet by mouth in the morning. 60mg  of Vitamin C  (Ascorbic Acid); 2mg   Manganese (Manganese Sulfate); 35mg  Sodium; 1500mg  Glucosamine HCI; 100mg  5-Loxin (Boswellia Serrata Extract resin); 1103mg  (Chondroitin/MSM Complex (Chrondroitin Sulfate, Methylsulfonylmethane, Collagen (Hydrolyzed Gelatin), Boswellia Serrata (resin); Boron (Bororganic Glycine); Hyaluronic Acid   NEURIVA PO Take 1 tablet by mouth daily.   phenylephrine 1 % nasal spray Commonly known as: NEO-SYNEPHRINE Place 1 drop into both nostrils 3 (three) times daily as needed for congestion.   tamoxifen 20 MG tablet Commonly known as: NOLVADEX Take 1 tablet (20 mg total) by mouth daily.   venlafaxine XR 37.5 MG 24 hr capsule Commonly known as: EFFEXOR-XR Take 37.5 mg by mouth daily.        Discharge Instructions: Please refer to Patient Instructions section of EMR for full details.  Patient was counseled important signs and symptoms that should prompt return to medical care, changes in medications, dietary instructions, activity restrictions, and follow up appointments.   Follow-Up Appointments:  Follow-up Information     [facility].   Specialty: Home Health Services Why: Physical Therapy. Office will call to arrange follow up after discharge. Contact information:  [address] [place] [address] [phone]                 Gerrit Heck, DO 03/26/2023, 1:30 PM PGY-1, [facility] Family Medicine   I agree with the assessment and plan as documented in the resident's note.  Janeal Holmes, MD                  03/26/2023, 1:55 PM PGY-2, [facility] Family [facility]

## 2023-03-26 NOTE — Progress Notes (Signed)
Pt discharged to home in stable condition. All discharge instructions reviewed with pt and caregiver, both give complete verbal understanding of all of the above. Pt transported off unit via wheelchair with staff x1 at chairside to private vehicle to transport to home.

## 2023-03-26 NOTE — Care Management Obs Status (Signed)
MEDICARE OBSERVATION STATUS NOTIFICATION   Patient Details  Name: Yvonne [name] MRN: [id number] Date of Birth: 16-Aug-1940   Medicare Observation Status Notification Given:  Yes    Kermit Balo, RN 03/26/2023, 11:41 AM

## 2023-03-26 NOTE — Progress Notes (Signed)
Physical Therapy Treatment Patient Details Name: Yvonne [name] MRN: [id number] DOB: 04-Feb-1941 Today's Date: 03/26/2023   History of Present Illness 82 y.o. female admitted 03/24/23 with dizziness and R LE pain  PMH includes: HTN, CVA, L THA.  CT head positive for infarct in posterior right cerebellum (age indeterminate,) remote infarct of L insular cortex and superior temporal gyrus and L temporal craniotomy.    PT Comments  Patient is agreeable to PT session and is hopeful to be discharged today. Ambulation performed with and without rolling walker with improved gait pattern using rolling walker. Stair training completed with cues for sequencing and technique using 2 rails. Increased trunk sway without UE support with no overt loss of balance. Fall prevention strategies discussed. Recommend to continue PT to maximize independence and facilitate return to prior level of function.    If plan is discharge home, recommend the following: A little help with walking and/or transfers;Assistance with cooking/housework;Assist for transportation;A little help with bathing/dressing/bathroom;Help with stairs or ramp for entrance   Can travel by private vehicle        Equipment Recommendations  None recommended by PT    Recommendations for Other Services       Precautions / Restrictions Precautions Precautions: Fall Restrictions Weight Bearing Restrictions: No     Mobility  Bed Mobility Overal bed mobility: Needs Assistance Bed Mobility: Supine to Sit, Sit to Supine     Supine to sit: Min assist Sit to supine: Supervision        Transfers Overall transfer level: Needs assistance Equipment used: Rolling walker (2 wheels) Transfers: Sit to/from Stand Sit to Stand: Supervision                Ambulation/Gait Ambulation/Gait assistance: Contact guard assist Gait Distance (Feet): 220 Feet Assistive device: Rolling walker (2 wheels) Gait Pattern/deviations: Step-through  pattern, Decreased stride length Gait velocity: decreased     General Gait Details: short distance ambulation without rolling walker with steppage gait, decreased step length and reaching out for furniture for support. encouraged patient to use rolling walker for safety with improved gait pattern and balance.   Stairs Stairs: Yes Stairs assistance: Contact guard assist Stair Management: Two rails, Step to pattern, Forwards Number of Stairs: 2 General stair comments: patient able to go up /down 2 steps with bilateral rails with CGA and verbal cues for sequencing. encouraged patient to use railing whenever possible for safety   Wheelchair Mobility     Tilt Bed    Modified Rankin (Stroke Patients Only)       Balance Overall balance assessment: Needs assistance Sitting-balance support: Feet supported Sitting balance-Leahy Scale: Good     Standing balance support: No upper extremity supported Standing balance-Leahy Scale: Fair Standing balance comment: patient has increased trunk sway without UE support with no gross loss of balance. with bilateral UE supported on rolling walker, trunk sway decreased                            Cognition Arousal: Alert Behavior During Therapy: WFL for tasks assessed/performed Overall Cognitive Status: No family/caregiver present to determine baseline cognitive functioning                                 General Comments: HOH. decreased insight into deficits        Exercises      General Comments  Pertinent Vitals/Pain Pain Assessment Pain Assessment: No/denies pain    Home Living     Available Help at Discharge: Personal care attendant;Available PRN/intermittently Type of Home: House                  Prior Function            PT Goals (current goals can now be found in the care plan section) Acute Rehab PT Goals Patient Stated Goal: to return home PT Goal Formulation: With  patient Time For Goal Achievement: 04/08/23 Potential to Achieve Goals: Good Progress towards PT goals: Progressing toward goals    Frequency    Min 1X/week      PT Plan      Co-evaluation              AM-PAC PT "6 Clicks" Mobility   Outcome Measure  Help needed turning from your back to your side while in a flat bed without using bedrails?: A Little Help needed moving from lying on your back to sitting on the side of a flat bed without using bedrails?: A Little Help needed moving to and from a bed to a chair (including a wheelchair)?: A Little Help needed standing up from a chair using your arms (e.g., wheelchair or bedside chair)?: A Little Help needed to walk in hospital room?: A Little Help needed climbing 3-5 steps with a railing? : A Little 6 Click Score: 18    End of Session Equipment Utilized During Treatment: Gait belt Activity Tolerance: Patient tolerated treatment well Patient left: in bed;with call bell/phone within reach;with bed alarm set   PT Visit Diagnosis: Other abnormalities of gait and mobility (R26.89);History of falling (Z91.81);Other symptoms and signs involving the nervous system (R29.898)     Time: 9811-9147 PT Time Calculation (min) (ACUTE ONLY): 18 min  Charges:    $Gait Training: 8-22 mins PT General Charges $$ ACUTE PT VISIT: 1 Visit                     Donna Bernard, PT, MPT    Ina Homes 03/26/2023, 12:39 PM

## 2023-03-26 NOTE — Assessment & Plan Note (Signed)
-   Admitted to [facility] Med Tele w/ attending Dr. Linwood Dibbles - Cont home ASA daily - Cont home atorvastatin - Neurology was consulted in ED. Will have AM team reach out  to make sure Neurology is officially consulted.  - f/u A1c, TSH, lipid - f/u echo - BL carotid US - SLP, PT, OT eval and treat - Cardiac monitoring

## 2023-03-26 NOTE — Plan of Care (Signed)
  Problem: Cardiovascular: Goal: Ability to achieve and maintain adequate cardiovascular perfusion will improve Outcome: Progressing Goal: Vascular access site(s) Level 0-1 will be maintained Outcome: Progressing   Problem: Nutrition: Goal: Risk of aspiration will decrease Outcome: Progressing Goal: Dietary intake will improve Outcome: Progressing   Problem: Fluid Volume: Goal: Ability to maintain a balanced intake and output will improve Outcome: Progressing   Problem: Nutritional: Goal: Maintenance of adequate nutrition will improve Outcome: Progressing   Problem: Skin Integrity: Goal: Risk for impaired skin integrity will decrease Outcome: Progressing   Problem: Tissue Perfusion: Goal: Adequacy of tissue perfusion will improve Outcome: Progressing

## 2023-03-26 NOTE — Assessment & Plan Note (Signed)
-   SSI - holding home metformin

## 2023-03-26 NOTE — Progress Notes (Signed)
Patient was alert to self and place. Rambles in conversation and thought that the cameras on the ceiling looked like monkeys. Confused to situation and time. Kept climbing out of bed and restless. Stated that she could not sleep. Called MD and got melatonin ordered. Took tylenol for R-leg pain that persisted after dose. Called MD and got an order for ibuprofen for pain. Patient refused potassium last night due to her stating that it kept her up urinating all night. Explained to patient why she needed it but patient got cup and sat it on the bedside table refusing to drink it. Patient denied additional needs.

## 2023-03-26 NOTE — Assessment & Plan Note (Signed)
Acute RLE pain x1day. DVT US neg for clot. RLE appears nontender, nonerythematous, nonedematous, and no deformities; thus lower c/f cellulitis, trauma. Could consider neuropathic pain, sciatica. - Cont home gabapentin
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
## 2023-03-26 NOTE — Assessment & Plan Note (Signed)
-   Holding home benazepril, amlodipine, hydrochlorothiazide, metop for permissive HTN.

## 2023-03-26 NOTE — Evaluation (Addendum)
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   Aneurysm of thoracic aorta (HCC) 06/04/2022   Pulmonary nodule 06/04/2022   Thyroid nodule 06/04/2022   Malignant neoplasm of lower-outer quadrant of left breast of female, estrogen receptor positive (HCC) 04/24/2021   Controlled type 2 diabetes mellitus with chronic kidney disease, without long-term current use of insulin (HCC) 11/27/2019   Memory impairment 08/31/2019   Depression, recurrent (HCC) 02/10/2019   Degenerative spondylolisthesis 06/06/2018   Sensorineural hearing loss (SNHL), 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   Bilateral carotid artery disease (HCC) 02/04/2017   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   Osteopenia 08/30/2014   Severe aortic stenosis 12/12/2009   Hypothyroidism 07/12/2009   INSOMNIA, PERSISTENT 07/12/2009   Essential hypertension 07/12/2009   Past Medical History:  Past Medical History:  Diagnosis Date   Anemia    Anxiety    Arthritis    Bilateral carotid artery disease (HCC) 02/04/2017   Carotid US 3/22: R 1-39; L 100   Depression, recurrent (HCC) 02/10/2019   Hypertension    Hypothyroidism    Impaired functional mobility, balance, gait, and endurance 09/02/2015   Malignant neoplasm of lower-outer quadrant of left breast of female, estrogen receptor positive (HCC) 04/24/2021   Mild cognitive impairment with memory loss 09/02/2015   Osteopenia    Pre-diabetes    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 Clifton James & Dr. Laneta Simmers   Severe aortic  stenosis    Stroke [facility])    pt was 87   Past Surgical History:  Past Surgical History:  Procedure Laterality Date   ABDOMINAL HYSTERECTOMY     BACK SURGERY     2019   BRAIN SURGERY     age 77's- steel plate in back of head   BREAST LUMPECTOMY WITH RADIOACTIVE SEED AND SENTINEL LYMPH NODE BIOPSY Left 05/17/2021   Procedure: LEFT BREAST SEED LOCALIZED LUMPECTOMY (BRACKETED) WITH SENTINEL LYMPH NODE BIOPSY;  Surgeon: Harriette Bouillon, MD;  Location: [facility] OR;  Service: General;  Laterality: Left;  PEC BLOCK 90 MINUTES ROOM 9   CATARACT EXTRACTION Bilateral    INTRAOPERATIVE TRANSTHORACIC ECHOCARDIOGRAM N/A 06/26/2022   Procedure: INTRAOPERATIVE TRANSTHORACIC ECHOCARDIOGRAM;  Surgeon: Kathleene Hazel, MD;  Location: [facility] OR;  Service: Open Heart Surgery;  Laterality: N/A;   RE-EXCISION OF BREAST LUMPECTOMY Left 06/08/2021   Procedure: RE-EXCISION OF LEFT BREAST LUMPECTOMY;  Surgeon: Harriette Bouillon, MD;  Location: [facility] ORS;  Service: General;  Laterality: Left;   RIGHT HEART CATH AND CORONARY ANGIOGRAPHY N/A 05/17/2022   Procedure: RIGHT HEART CATH AND CORONARY ANGIOGRAPHY;  Surgeon: Kathleene Hazel, MD;  Location: [facility] INVASIVE CV LAB;  Service: Cardiovascular;  Laterality: N/A;   TOE AMPUTATION  2013   2nd toe on right foot, hammer toe   TONSILLECTOMY     TOOTH EXTRACTION     all teeth removed   TOTAL HIP ARTHROPLASTY Left 03/19/2017   Procedure: LEFT TOTAL HIP ARTHROPLASTY ANTERIOR APPROACH;  Surgeon: Kathryne Hitch, MD;  Location: [facility] OR;  Service: Orthopedics;  Laterality: Left;   TRANSCATHETER AORTIC VALVE REPLACEMENT, TRANSFEMORAL N/A 06/26/2022  Procedure: TRANSCATHETR AORTIC VALVE REPLACEMENT, TRANSFEMORAL;  Surgeon: Kathleene Hazel, MD;  Location: [facility] OR;  Service: Open Heart Surgery;  Laterality: N/A;   ULTRASOUND GUIDANCE FOR VASCULAR ACCESS Bilateral 06/26/2022   Procedure: ULTRASOUND GUIDANCE FOR VASCULAR ACCESS;  Surgeon: Kathleene Hazel, MD;   Location: [facility] OR;  Service: Open Heart Surgery;  Laterality: Bilateral;   HPI:  82 y.o. female admitted 03/24/23 with dizziness and R LE pain  PMH includes: HTN, CVA, L THA.  CT head positive for infarct in posterior right cerebellum (age indeterminate,) remote infarct of L insular cortex and superior temporal gyrus and L temporal craniotomy.   Assessment / Plan / Recommendation Clinical Impression  Pt seen with hearing aids donned for speech-language-cognitive assessment. She states her memory is sometimes decreased. Oromotor exam was within normal limits. She demonstrates mild cognitive impairments on the SLUMS scoring a 24/30 with impairments in memory, safety awareness, divergent naming. Pt states she writes information down for her memory and refers to it. Pt has a personal care attendant that assists 3 times a week.She needed assist to remember to utilize the call bell if she needs help. She is scheduled to be discharged today and would benefit from home health ST to evaluate and treat if needed.    SLP Assessment  SLP Recommendation/Assessment: All further Speech Lanaguage Pathology  needs can be addressed in the next venue of care SLP Visit Diagnosis: Cognitive communication deficit (R41.841)    Recommendations for follow up therapy are one component of a multi-disciplinary discharge planning process, led by the attending physician.  Recommendations may be updated based on patient status, additional functional criteria and insurance authorization.    Follow Up Recommendations  Home health SLP    Assistance Recommended at Discharge  Intermittent Supervision/Assistance  Functional Status Assessment Patient has had a recent decline in their functional status and demonstrates the ability to make significant improvements in function in a reasonable and predictable amount of time.  Frequency and Duration           SLP Evaluation Cognition  Overall Cognitive Status: No family/caregiver  present to determine baseline cognitive functioning Arousal/Alertness: Awake/alert Orientation Level: Oriented to person;Oriented to time;Oriented to situation;Oriented to place Year: 2024 Day of Week: Correct Attention: Sustained Sustained Attention: Appears intact Memory: Impaired Memory Impairment: Retrieval deficit (3/5 words) Awareness: Appears intact Problem Solving: Impaired Problem Solving Impairment: Functional basic Safety/Judgment: Impaired       Comprehension  Auditory Comprehension Overall Auditory Comprehension: Appears within functional limits for tasks assessed Visual Recognition/Discrimination Discrimination: Not tested Reading Comprehension Reading Status: Not tested    Expression Expression Primary Mode of Expression: Verbal Verbal Expression Overall Verbal Expression: Appears within functional limits for tasks assessed Initiation: No impairment Level of Generative/Spontaneous Verbalization: Conversation Repetition:  (NT) Naming: Impairment Divergent:  (named 8 animals in one min) Pragmatics: No impairment Written Expression Dominant Hand: Right Written Expression: Not tested   Oral / Motor  Oral Motor/Sensory Function Overall Oral Motor/Sensory Function: Within functional limits Motor Speech Overall Motor Speech: Appears within functional limits for tasks assessed Respiration: Within functional limits Phonation: Normal Resonance: Within functional limits Articulation: Within functional limitis Intelligibility: Intelligible Motor Planning: Witnin functional limits Motor Speech Errors: Not applicable            Royce Macadamia 03/26/2023, 9:28 AM

## 2023-03-26 NOTE — TOC Transition Note (Signed)
Transition of Care [facility]) - CM/SW Discharge Note   Patient Details  Name: Yvonne [name] MRN: [id number] Date of Birth: 1940/11/09  Transition of Care [facility]) CM/SW Contact:  Kermit Balo, RN Phone Number: 03/26/2023, 11:42 AM   Clinical Narrative:     Pt is discharging home with home health through [facility]. Information on the AVS. [facility] will contact her for the first home visit. No DME needs.  Pt has transportation home.  Final next level of care: Home w Home Health Services Barriers to Discharge: No Barriers Identified   Patient Goals and CMS Choice CMS Medicare.gov Compare Post Acute Care list provided to:: Patient    Discharge Placement                         Discharge Plan and Services Additional resources added to the After Visit Summary for                            [facility] Arranged: PT, Speech Therapy HH Agency: [facility] Date [facility] Agency Contacted: 03/26/23 Time HH Agency Contacted: 1651 Representative spoke with at [facility] Agency: Tresa Endo  Social Determinants of Health (SDOH) Interventions SDOH Screenings   Food Insecurity: No Food Insecurity (03/25/2023)  Housing: Low Risk  (03/25/2023)  Transportation Needs: No Transportation Needs (03/25/2023)  Utilities: Not At Risk (03/25/2023)  Depression (PHQ2-9): Medium Risk (04/30/2022)  Financial Resource Strain: Low Risk  (06/23/2021)  Physical Activity: Unknown (06/07/2018)  Social Connections: Moderately Integrated (06/23/2021)  Stress: No Stress Concern Present (06/23/2021)  Tobacco Use: Low Risk  (03/25/2023)     Readmission Risk Interventions    06/27/2022   12:10 PM  Readmission Risk Prevention Plan  Post Dischage Appt Complete  Medication Screening Complete  Transportation Screening Complete

## 2023-04-18 ENCOUNTER — Encounter: Payer: Self-pay | Admitting: Hematology

## 2023-04-23 ENCOUNTER — Other Ambulatory Visit: Payer: Self-pay | Admitting: Physician Assistant

## 2023-04-23 DIAGNOSIS — Z952 Presence of prosthetic heart valve: Secondary | ICD-10-CM

## 2023-04-27 ENCOUNTER — Emergency Department (HOSPITAL_COMMUNITY): Payer: Medicare HMO

## 2023-04-27 ENCOUNTER — Emergency Department (HOSPITAL_COMMUNITY)
Admission: EM | Admit: 2023-04-27 | Discharge: 2023-04-27 | Disposition: A | Discharge status: Home / Self Care | Payer: Medicare HMO | Attending: Emergency Medicine | Admitting: Emergency Medicine

## 2023-04-27 ENCOUNTER — Other Ambulatory Visit: Payer: Self-pay

## 2023-04-27 ENCOUNTER — Encounter (HOSPITAL_COMMUNITY): Payer: Self-pay

## 2023-04-27 DIAGNOSIS — W010XXA Fall on same level from slipping, tripping and stumbling without subsequent striking against object, initial encounter: Secondary | ICD-10-CM | POA: Diagnosis not present

## 2023-04-27 DIAGNOSIS — Y92019 Unspecified place in single-family (private) house as the place of occurrence of the external cause: Secondary | ICD-10-CM | POA: Diagnosis not present

## 2023-04-27 DIAGNOSIS — Z7982 Long term (current) use of aspirin: Secondary | ICD-10-CM | POA: Insufficient documentation

## 2023-04-27 DIAGNOSIS — S8992XA Unspecified injury of left lower leg, initial encounter: Secondary | ICD-10-CM | POA: Diagnosis present

## 2023-04-27 DIAGNOSIS — S8002XA Contusion of left knee, initial encounter: Secondary | ICD-10-CM

## 2023-04-27 DIAGNOSIS — D72829 Elevated white blood cell count, unspecified: Secondary | ICD-10-CM | POA: Insufficient documentation

## 2023-04-27 LAB — CBC WITH DIFFERENTIAL/PLATELET
Abs Immature Granulocytes: 0.11 10*3/uL — ABNORMAL HIGH (ref 0.00–0.07)
Basophils Absolute: 0.1 10*3/uL (ref 0.0–0.1)
Basophils Relative: 1 %
Eosinophils Absolute: 0.2 10*3/uL (ref 0.0–0.5)
Eosinophils Relative: 2 %
HCT: 40.3 % (ref 36.0–46.0)
Hemoglobin: 13 g/dL (ref 12.0–15.0)
Immature Granulocytes: 1 %
Lymphocytes Relative: 13 %
Lymphs Abs: 1.4 10*3/uL (ref 0.7–4.0)
MCH: 29.4 pg (ref 26.0–34.0)
MCHC: 32.3 g/dL (ref 30.0–36.0)
MCV: 91.2 fL (ref 80.0–100.0)
Monocytes Absolute: 0.7 10*3/uL (ref 0.1–1.0)
Monocytes Relative: 7 %
Neutro Abs: 8.4 10*3/uL — ABNORMAL HIGH (ref 1.7–7.7)
Neutrophils Relative %: 76 %
Platelets: 297 10*3/uL (ref 150–400)
RBC: 4.42 MIL/uL (ref 3.87–5.11)
RDW: 15.4 % (ref 11.5–15.5)
WBC: 10.9 10*3/uL — ABNORMAL HIGH (ref 4.0–10.5)
nRBC: 0 % (ref 0.0–0.2)

## 2023-04-27 LAB — COMPREHENSIVE METABOLIC PANEL
ALT: 28 U/L (ref 0–44)
AST: 43 U/L — ABNORMAL HIGH (ref 15–41)
Albumin: 3.5 g/dL (ref 3.5–5.0)
Alkaline Phosphatase: 49 U/L (ref 38–126)
Anion gap: 11 (ref 5–15)
BUN: 14 mg/dL (ref 8–23)
CO2: 25 mmol/L (ref 22–32)
Calcium: 9.4 mg/dL (ref 8.9–10.3)
Chloride: 102 mmol/L (ref 98–111)
Creatinine, Ser: 0.73 mg/dL (ref 0.44–1.00)
GFR, Estimated: >60 mL/min (ref >60)
Glucose, Bld: 226 mg/dL — ABNORMAL HIGH (ref 70–99)
Potassium: 4.1 mmol/L (ref 3.5–5.1)
Sodium: 138 mmol/L (ref 135–145)
Total Bilirubin: 0.5 mg/dL (ref <1.2)
Total Protein: 7.6 g/dL (ref 6.5–8.1)

## 2023-04-27 MED ORDER — HYDROCODONE-ACETAMINOPHEN 5-325 MG PO TABS
1.0000 | ORAL_TABLET | Freq: Once | ORAL | Status: AC
  Administered 2023-04-27: 1 via ORAL
  Filled 2023-04-27: qty 1

## 2023-04-27 MED ORDER — HYDROCODONE-ACETAMINOPHEN 5-325 MG PO TABS: 1.0000 | ORAL_TABLET | Freq: Four times a day (QID) | ORAL | 0 refills | Status: DC | PRN

## 2023-04-27 MED ORDER — FENTANYL CITRATE PF 50 MCG/ML IJ SOSY
50.0000 ug | PREFILLED_SYRINGE | Freq: Once | INTRAMUSCULAR | Status: AC
  Administered 2023-04-27: 50 ug via INTRAVENOUS
  Filled 2023-04-27: qty 1

## 2023-04-27 NOTE — ED Notes (Signed)
Patient was able to walk with assit of 2 states her knee doesn't hurt that bad and she has a walker at home.

## 2023-04-27 NOTE — Evaluation (Signed)
Physical Therapy Brief Evaluation and Discharge Note Patient Details Name: Yvonne [name] MRN: [id number] DOB: 1940-10-07 Today's Date: 04/27/2023   History of Present Illness  82 y.o. female admitted 04/27/23 with fall at home resulting in a painful lt knee. Knee x-ray showed No acute fracture or dislocation. Significant soft tissue swelling anterior to the knee PMH - HTN, recent CVA, L THA.  Clinical Impression  Pt presents to PT not far from recent baseline. Pt fell this AM when amb to bathroom at home without using her walker. Reiterated importance of using her walker every time she gets up and is ambulating. Pt with long history of falls at home even before her most recent CVA and will always be a fall risk. Recommend resuming her HHPT services.        PT Assessment All further PT needs can be met in the next venue of care  Assistance Needed at Discharge  Intermittent Supervision/Assistance    Equipment Recommendations None recommended by PT  Recommendations for Other Services       Precautions/Restrictions Precautions Precautions: Fall Restrictions Weight Bearing Restrictions: No        Mobility  Bed Mobility   Supine/Sidelying to sit: Min assist Sit to supine/sidelying: Contact guard assist General bed mobility comments: Assist to elevate trunk due to limited space on stretcher  Transfers Overall transfer level: Needs assistance Equipment used: Rolling walker (2 wheels) Transfers: Sit to/from Stand Sit to Stand: Supervision           General transfer comment: supervision for safety    Ambulation/Gait Ambulation/Gait assistance: Supervision Gait Distance (Feet): 100 Feet Assistive device: Rolling walker (2 wheels) Gait Pattern/deviations: Step-through pattern, Decreased stride length, Decreased step length - right, Decreased step length - left Gait Speed: Below normal General Gait Details: supervision for safety  Home Activity Instructions     Stairs            Modified Rankin (Stroke Patients Only)        Balance Overall balance assessment: Needs assistance Sitting-balance support: No upper extremity supported, Feet supported Sitting balance-Leahy Scale: Fair     Standing balance support: Single extremity supported, Bilateral upper extremity supported, During functional activity Standing balance-Leahy Scale: Poor Standing balance comment: UE support needed          Pertinent Vitals/Pain PT - Brief Vital Signs All Vital Signs Stable: Yes Pain Assessment Pain Assessment: Faces Faces Pain Scale: Hurts a little bit Pain Location: rt knee Pain Descriptors / Indicators: Sore Pain Intervention(s): Limited activity within patient's tolerance     Home Living Family/patient expects to be discharged to:: Private residence Living Arrangements: Alone Available Help at Discharge: Personal care attendant;Available PRN/intermittently Home Environment: Stairs to enter;Rail - right;Rail - left  Stairs-Number of Steps: 4-5 Home Equipment: Rollator (4 wheels);Cane - single point;Hand held shower head;Grab bars - tub/shower;Shower seat   Additional Comments: has aide at home 2 hrs/day    Prior Function Level of Independence: Needs assistance Comments: Since recent CVA has been mostly amb with rolling walker but was not using it today when she fell going to the bathroom    UE/LE Assessment   UE ROM/Strength/Tone/Coordination: [facility]    LE ROM/Strength/Tone/Coordination: Generalized weakness      Communication   Communication Communication: Hearing impairment     Cognition Overall Cognitive Status: History of cognitive impairments - at baseline Comments: Decr safety awareness, decr short term memory     General Comments      Exercises  Assessment/Plan    PT Problem List Decreased strength;Decreased balance;Decreased mobility;Decreased safety awareness       PT Visit Diagnosis Other  abnormalities of gait and mobility (R26.89);Unsteadiness on feet (R26.81);Repeated falls (R29.6)    No Skilled PT     Co-evaluation                AMPAC 6 Clicks Help needed turning from your back to your side while in a flat bed without using bedrails?: None Help needed moving from lying on your back to sitting on the side of a flat bed without using bedrails?: A Little Help needed moving to and from a bed to a chair (including a wheelchair)?: A Little Help needed standing up from a chair using your arms (e.g., wheelchair or bedside chair)?: A Little Help needed to walk in hospital room?: A Little Help needed climbing 3-5 steps with a railing? : A Little 6 Click Score: 19      End of Session Equipment Utilized During Treatment: Gait belt Activity Tolerance: Patient tolerated treatment well Patient left: in bed;with call bell/phone within reach;with nursing/sitter in room Nurse Communication: Mobility status PT Visit Diagnosis: Other abnormalities of gait and mobility (R26.89);Unsteadiness on feet (R26.81);Repeated falls (R29.6)     Time: 1610-9604 PT Time Calculation (min) (ACUTE ONLY): 17 min  Charges:   PT Evaluation $PT Eval Low Complexity: 1 Low      [facility] PT Acute Rehabilitation Services Office [phone]   Angelina Ok [facility]  04/27/2023, 3:27 PM

## 2023-04-27 NOTE — ED Notes (Signed)
PT cleaned and changed after having bowel movement during fall.

## 2023-04-27 NOTE — Discharge Instructions (Addendum)
Use Tylenol and apply ice to the knee.  Keep the knee wrapped with an Ace wrap.  You may use the hydrocodone for breakthrough or severe pain as prescribed.  Please use your walker anytime you get up, even going to the bathroom.  If you develop new or worsening knee pain, swelling, trouble walking, weakness or numbness, or any other new/concerning symptoms then return to the ER or call 911.

## 2023-04-27 NOTE — ED Triage Notes (Signed)
Pt from home with ems for a fall. Pt was going to the restroom, tripped and fell onto her knees. Pt was able to slid on her bottom to get to a phone to call. Pt denies LOC or hitting her head, left knee is very swollen. +pedal pulses. Pt does not take a blood thinner, recent stroke last month

## 2023-04-27 NOTE — ED Notes (Signed)
PT at bedside.

## 2023-04-27 NOTE — ED Notes (Signed)
Ice applied to left knee

## 2023-04-27 NOTE — ED Provider Notes (Signed)
[place] EMERGENCY DEPARTMENT AT [facility] Provider Note   CSN: [id number] Arrival date & time: 04/27/23  7829     History  Chief Complaint  Patient presents with   Marletta Lor    Yvonne [name] is a 82 y.o. female.  HPI 82 year old female presents with a fall and left knee injury.  History is taken from EMS and patient.  Patient has been off balance since having a stroke last month.  This morning she was getting up and was off balance and tried to hold onto the wall but ended up falling and injuring her left knee.  Did not hit her head or lose consciousness.  Did not feel dizzy.  Is having severe left knee pain.  EMS tried to get her up to walk but she could not due to the pain.  No new numbness or weakness.  No chest pain.  Patient rates her pain as severe.  When asked, patient has been having diarrhea which she states is an on-and-off problem.  No blood in the stools.  Home Medications Prior to Admission medications   Medication Sig Start Date End Date Taking? Authorizing Provider  acetaminophen (TYLENOL) 500 MG tablet Take 2 tablets (1,000 mg total) by mouth every 6 (six) hours as needed for moderate pain, mild pain, fever or headache. 03/26/23  Yes Mabe, Earvin Hansen, MD  amitriptyline (ELAVIL) 25 MG tablet Take 1 tablet (25 mg total) by mouth at bedtime. 03/26/23  Yes Mabe, Earvin Hansen, MD  amLODipine (NORVASC) 5 MG tablet Take 1 tablet (5 mg total) by mouth at bedtime. Patient taking differently: Take 5 mg by mouth in the morning. 04/30/22  Yes Westley Chandler, MD  aspirin 81 MG chewable tablet Chew 1 tablet (81 mg total) by mouth daily. 12/23/19  Yes Westley Chandler, MD  atorvastatin (LIPITOR) 20 MG tablet Take 2 tablets (40 mg total) by mouth daily. Patient taking differently: Take 20 mg by mouth daily. 03/26/23  Yes Mabe, Earvin Hansen, MD  gabapentin (NEURONTIN) 100 MG capsule TAKE 1 CAPSULE EVERY MORNING AND TAKE 2 CAPSULES EVERY NIGHT 09/17/22  Yes Westley Chandler, MD   HYDROcodone-acetaminophen (NORCO) 5-325 MG tablet Take 1 tablet by mouth every 6 (six) hours as needed for severe pain (pain score 7-10). 04/27/23  Yes Pricilla Loveless, MD  levothyroxine (SYNTHROID) 88 MCG tablet Take 1 tablet (88 mcg total) by mouth every morning. 30 minutes before food 07/03/22  Yes Latrelle Dodrill, MD  lidocaine (LIDODERM) 5 % Place 1 patch onto the skin daily. Remove & Discard patch within 12 hours or as directed by MD 03/27/23  Yes Evette Georges, MD  metFORMIN (GLUCOPHAGE) 500 MG tablet Take 500 mg by mouth daily. 11/14/22  Yes [provider]  Misc Natural Products (NEURIVA PO) Take 1 tablet by mouth daily.   Yes [provider]  Misc Natural Products (OSTEO BI-FLEX ADV TRIPLE ST) TABS Take 1 tablet by mouth in the morning. 60mg  of Vitamin C  (Ascorbic Acid); 2mg  Manganese (Manganese Sulfate); 35mg  Sodium; 1500mg  Glucosamine HCI; 100mg  5-Loxin (Boswellia Serrata Extract resin); 1103mg  (Chondroitin/MSM Complex (Chrondroitin Sulfate, Methylsulfonylmethane, Collagen (Hydrolyzed Gelatin), Boswellia Serrata (resin); Boron (Bororganic Glycine); Hyaluronic Acid   Yes [provider]  Multiple Vitamins-Minerals (CENTRUM SILVER ADULT 50+) TABS Take 1 tablet by mouth in the morning.   Yes [provider]  phenylephrine (NEO-SYNEPHRINE) 1 % nasal spray Place 1 drop into both nostrils 3 (three) times daily as needed for congestion.   Yes [provider]  tamoxifen (NOLVADEX) 20 MG tablet Take 1 tablet (20 mg total) by mouth daily. 08/30/22  Yes Pollyann Samples, NP  venlafaxine XR (EFFEXOR-XR) 37.5 MG 24 hr capsule Take 37.5 mg by mouth daily. 03/18/23  Yes [provider]  melatonin 5 MG TABS Take 1 tablet (5 mg total) by mouth at bedtime as needed. Patient not taking: Reported on 04/27/2023 03/26/23   Evette Georges, MD      Allergies    Patient has no known allergies.    Review of Systems   Review of Systems  Respiratory:  Negative for  shortness of breath.   Cardiovascular:  Negative for chest pain.  Gastrointestinal:  Negative for abdominal pain.  Musculoskeletal:  Positive for arthralgias, gait problem and joint swelling.  Neurological:  Negative for weakness and headaches.    Physical Exam Updated Vital Signs BP (!) 147/77   Pulse 88   Temp 97.8 F (36.6 C) (Oral)   Resp 17   SpO2 92%  Physical Exam Vitals and nursing note reviewed.  Constitutional:      Appearance: She is well-developed.  HENT:     Head: Normocephalic and atraumatic.  Cardiovascular:     Rate and Rhythm: Normal rate and regular rhythm.     Pulses:          Dorsalis pedis pulses are 2+ on the right side and 2+ on the left side.  Pulmonary:     Effort: Pulmonary effort is normal.  Abdominal:     General: There is no distension.     Palpations: Abdomen is soft.     Tenderness: There is no abdominal tenderness.  Musculoskeletal:     Left hip: No deformity or tenderness.     Left upper leg: No tenderness.     Left knee: Swelling present. Decreased range of motion. Tenderness present.     Left lower leg: No tenderness.  Skin:    General: Skin is warm and dry.  Neurological:     Mental Status: She is alert.     Comments: Patient has equal strength in all 4 extremities, though limited in testing in left leg due to knee injury     ED Results / Procedures / Treatments   Labs (all labs ordered are listed, but only abnormal results are displayed) Labs Reviewed  COMPREHENSIVE METABOLIC PANEL - Abnormal; Notable for the following components:      Result Value   Glucose, Bld 226 (*)    AST 43 (*)    All other components within normal limits  CBC WITH DIFFERENTIAL/PLATELET - Abnormal; Notable for the following components:   WBC 10.9 (*)    Neutro Abs 8.4 (*)    Abs Immature Granulocytes 0.11 (*)    All other components within normal limits    EKG EKG Interpretation Date/Time:  Saturday April 27 2023 10:04:54 EST Ventricular  Rate:  91 PR Interval:  165 QRS Duration:  112 QT Interval:  396 QTC Calculation: 488 R Axis:   -75  Text Interpretation: Sinus rhythm Incomplete RBBB and LAFB Low voltage, precordial leads Probable anterolateral infarct, old rate is faster, otherwise ECG similar to oct 2024 Confirmed by Pricilla Loveless [phone]) on 04/27/2023 10:09:51 AM  Radiology DG Knee Complete 4 Views Left  Result Date: 04/27/2023 CLINICAL DATA:  Fall with knee pain. EXAM: LEFT KNEE - COMPLETE 4+ VIEW COMPARISON:  None Available. FINDINGS: No evidence of fracture, dislocation, or joint effusion. Superior and inferior patellar enthesophytes are noted. There  is significant soft tissue swelling anterior to the knee. IMPRESSION: No acute fracture or dislocation. Significant soft tissue swelling anterior to the knee. Electronically Signed   By: Romona Curls M.D.   On: 04/27/2023 11:01    Procedures Procedures    Medications Ordered in ED Medications  fentaNYL (SUBLIMAZE) injection 50 mcg (50 mcg Intravenous Given 04/27/23 1006)  HYDROcodone-acetaminophen (NORCO/VICODIN) 5-325 MG per tablet 1 tablet (1 tablet Oral Given 04/27/23 1229)    ED Course/ Medical Decision Making/ A&P                                 Medical Decision Making Amount and/or Complexity of Data Reviewed Labs: ordered.    Details: Mild leukocytosis, probably related to trauma. Radiology: ordered and independent interpretation performed.    Details: No fracture ECG/medicine tests: ordered and independent interpretation performed.    Details: No arrhythmia or ischemia  Risk Prescription drug management.   Patient with a fall, seems to be from her new difficulty walking since her last admission.  No head injury.  Has a sizable knee contusion but x-ray is unremarkable.  After pain control she is able to get up and ambulate and bear weight.  Doubt occult fracture.  She feels comfortable going home with some pain control and will wrap her knee with  an Ace wrap, recommend ice, and give a brief course of hydrocodone.  Will discharge home with return precautions.  Discussed this with son at bedside.        Final Clinical Impression(s) / ED Diagnoses Final diagnoses:  Contusion of left knee, initial encounter    Rx / DC Orders ED Discharge Orders          Ordered    HYDROcodone-acetaminophen (NORCO) 5-325 MG tablet  Every 6 hours PRN        04/27/23 1457              Pricilla Loveless, MD 04/27/23 1501

## 2023-04-27 NOTE — ED Notes (Signed)
X-ray at bedside

## 2023-04-27 NOTE — ED Notes (Signed)
Patient's O2 were dropping. Paramedic is aware and is in the room.

## 2023-06-03 ENCOUNTER — Other Ambulatory Visit: Payer: Self-pay | Admitting: Hematology

## 2023-07-10 NOTE — Progress Notes (Signed)
[facility]ENTER   MULTIDISCIPLINARY HEART VALVE TEAM  Structural Heart Office Note:  .    Date:  07/16/2023  ID:  Yvonne [name], DOB Mar 27, 1941, MRN [id number] PCP: System, Provider Not In  [facility] Providers Cardiologist:  Kristeen Miss, MD  History of Present Illness: .   Yvonne [name] is a 83 y.o. 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   [name] had a stroke at age 63 and is known to have a chronic left internal carotid artery occlusion. She has had right sided arm and leg weakness since her stroke and has neuropathy in both feet. She had a craniotomy in her 68s with removal of a benign tumor.    Her aortic stenosis has been moderate and followed by Dr. Elease Hashimoto for several years that eventually worsened into the severe range. She was referred to the  and evaluated by the multidisciplinary valve team and underwent successful TAVR with a 23 mm Edwards Sapien 3 Ultra Resilia THV via the TF approach on 06/26/22. Out of concern for possible HCM versus longstanding sequelae from AS/HTN she was seen by Dr. Izora Ribas with plan for serial echo screening. Genetic testing and CMR were deferred.   Today she is here with her caregiver and reports she has been doing well. When they go to the store, she walks with a cart with no SOB. She does report some intermittent heart racing which occurs while watching TV in the evenings. Prior ZIO 08/2022 with episodes of SVT and no AF. I suspect this is what is occurring. Otherwise she denies chest pain, SOB, LE edema, orthopnea, PND, dizziness, or syncope. Denies bleeding in stool or urine.    Physical Exam:   VS:  BP 120/80   Pulse 78   Ht 5\' 1"  (1.549 m)   Wt 161 lb 3.2 oz  (73.1 kg)   SpO2 92%   BMI 30.46 kg/m    Wt Readings from Last 3 Encounters:  07/15/23 161 lb 3.2 oz (73.1 kg)  03/24/23 150 lb (68 kg)  02/28/23 159 lb 6.4 oz (72.3 kg)    General: Well developed, well nourished, NAD Lungs:Clear to ausculation bilaterally. No wheezes, rales, or rhonchi. Breathing is unlabored. Cardiovascular: RRR with S1 S2. No murmurs Extremities: No edema.  Neuro: Alert and oriented. No focal deficits. No facial asymmetry. MAE spontaneously. Psych: Responds to questions appropriately with normal affect.    ASSESSMENT AND PLAN: .    Severe AS s/p TAVR: Patient doing well with NYHA class I symptoms s/p TAVR. Echo today with hyperdynamic LV function at 65-70% with severe asymmetric LVH and no LV outflow tract gradient, GiDD, small ASD vs large PFO, stable 23mm S3UR TAVR valve with a mean gradient at , EOA 2.53cm2, and DI of 0.52. Continue Asprin 81mg  daily. Patient is edentulous and does not require SBE. Plan 2-week follow up with myself given palpitations with med change. See below.   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 Reports intermittent palpitations. Add beta blocker and follow for improvement in 2 weeks.    HTN: Normal BP although follow next appointment with the addition  of metoprolol   Hx of CVA: Continue on aspirin and statin. No new neuro changes.    Pulmonary nodules: pre TAVR CT showed "two solid pulmonary nodules, largest 0.5 cm. Recommend attention on follow-up chest CT in 3 months given the history of malignancy and absence of prior scans. Follow up imaging 07/2022 with stable nodules. Will have PCP take over follow up for this. Given the need to image TAA, will order chest CTA to evaluate both.     Thyroid nodules: pre TAVR CT showed a "hypodense bilateral thyroid nodules, largest 1.7 cm on the right. Thyroid US 07/2022 without  worrisome nodule or cyst that did not meet criteria for further testing.   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 Recommend annual imaging followup by CTA or MRA". CTA ordered as above.    Severe basal septal LVH: Saw Dr. Scarlette Shorts with plan for surveillance monitoring. Deferred genetic testing.   I spent 25 minutes caring for this patient today including face-to-face discussions, ordering and reviewing labs, reviewing records from [facility] and other outside facilities, documenting in the record, and arranging for follow up.    Signed, Georgie Chard, NP

## 2023-07-15 ENCOUNTER — Ambulatory Visit: Payer: Medicare HMO | Attending: Cardiology

## 2023-07-15 ENCOUNTER — Ambulatory Visit (INDEPENDENT_AMBULATORY_CARE_PROVIDER_SITE_OTHER): Payer: Medicare HMO

## 2023-07-15 VITALS — BP 120/80 | HR 78 | Ht 61.0 in | Wt 161.2 lb

## 2023-07-15 DIAGNOSIS — I517 Cardiomegaly: Secondary | ICD-10-CM

## 2023-07-15 DIAGNOSIS — I471 Supraventricular tachycardia, unspecified: Secondary | ICD-10-CM | POA: Diagnosis not present

## 2023-07-15 DIAGNOSIS — R002 Palpitations: Secondary | ICD-10-CM

## 2023-07-15 DIAGNOSIS — I350 Nonrheumatic aortic (valve) stenosis: Secondary | ICD-10-CM | POA: Insufficient documentation

## 2023-07-15 DIAGNOSIS — I712 Thoracic aortic aneurysm, without rupture, unspecified: Secondary | ICD-10-CM

## 2023-07-15 DIAGNOSIS — I714 Abdominal aortic aneurysm, without rupture, unspecified: Secondary | ICD-10-CM | POA: Insufficient documentation

## 2023-07-15 DIAGNOSIS — Z952 Presence of prosthetic heart valve: Secondary | ICD-10-CM | POA: Diagnosis present

## 2023-07-15 DIAGNOSIS — I422 Other hypertrophic cardiomyopathy: Secondary | ICD-10-CM

## 2023-07-15 DIAGNOSIS — Z8673 Personal history of transient ischemic attack (TIA), and cerebral infarction without residual deficits: Secondary | ICD-10-CM | POA: Diagnosis present

## 2023-07-15 DIAGNOSIS — R911 Solitary pulmonary nodule: Secondary | ICD-10-CM | POA: Diagnosis present

## 2023-07-15 LAB — ECHOCARDIOGRAM COMPLETE
AR max vel: 2.3 cm2
AV Area VTI: 2.53 cm2
AV Area mean vel: 2.28 cm2
AV Mean grad: 9 mm[Hg]
AV Peak grad: 15.2 mm[Hg]
Ao pk vel: 1.95 m/s
Area-P 1/2: 2.76 cm2
S' Lateral: 2.5 cm

## 2023-07-15 MED ORDER — METOPROLOL TARTRATE 25 MG PO TABS: 25.0000 mg | ORAL_TABLET | Freq: Two times a day (BID) | ORAL | 3 refills | Status: DC

## 2023-07-15 NOTE — Patient Instructions (Signed)
Medication Instructions:  Your physician has recommended you make the following change in your medication:  STOP Amlodipine  START Metoprolol 25 mg taking 1 twice a day  *If you need a refill on your cardiac medications before your next appointment, please call your pharmacy*   Lab Work: None ordered  If you have labs (blood work) drawn today and your tests are completely normal, you will receive your results only by: MyChart Message (if you have MyChart) OR A paper copy in the mail If you have any lab test that is abnormal or we need to change your treatment, we will call you to review the results.   Testing/Procedures: None ordered   Follow-Up: At [facility], you and your health needs are our priority.  As part of our continuing mission to provide you with exceptional heart care, we have created designated Provider Care Teams.  These Care Teams include your primary Cardiologist (physician) and Advanced Practice Providers (APPs -  Physician Assistants and Nurse Practitioners) who all work together to provide you with the care you need, when you need it.  We recommend signing up for the patient portal called "MyChart".  Sign up information is provided on this After Visit Summary.  MyChart is used to connect with patients for Virtual Visits (Telemedicine).  Patients are able to view lab/test results, encounter notes, upcoming appointments, etc.  Non-urgent messages can be sent to your provider as well.   To learn more about what you can do with MyChart, go to [email or web address].    Your next appointment:   2 week(s)  Provider:   Georgie Chard, NP         Other Instructions

## 2023-07-22 ENCOUNTER — Other Ambulatory Visit: Payer: Self-pay | Admitting: Family Medicine

## 2023-07-22 DIAGNOSIS — E038 Other specified hypothyroidism: Secondary | ICD-10-CM

## 2023-07-22 NOTE — Telephone Encounter (Signed)
I believe she has a new PCP--can you please call patient and find out?

## 2023-07-23 NOTE — Telephone Encounter (Signed)
Called patient again, no answer, unable to lvm. Also lvm to her son Richardo Hanks. Penni Bombard CMA

## 2023-07-23 NOTE — Telephone Encounter (Signed)
Tried calling patient, no answer, will try again later. Penni Bombard CMA

## 2023-07-29 ENCOUNTER — Ambulatory Visit: Payer: Medicare HMO | Attending: Cardiology | Admitting: Cardiology

## 2023-07-29 VITALS — BP 154/68 | HR 94 | Ht 61.0 in | Wt 160.0 lb

## 2023-07-29 DIAGNOSIS — Z952 Presence of prosthetic heart valve: Secondary | ICD-10-CM | POA: Diagnosis not present

## 2023-07-29 DIAGNOSIS — R911 Solitary pulmonary nodule: Secondary | ICD-10-CM

## 2023-07-29 DIAGNOSIS — Z8673 Personal history of transient ischemic attack (TIA), and cerebral infarction without residual deficits: Secondary | ICD-10-CM

## 2023-07-29 DIAGNOSIS — E041 Nontoxic single thyroid nodule: Secondary | ICD-10-CM

## 2023-07-29 DIAGNOSIS — I471 Supraventricular tachycardia, unspecified: Secondary | ICD-10-CM | POA: Diagnosis not present

## 2023-07-29 DIAGNOSIS — I350 Nonrheumatic aortic (valve) stenosis: Secondary | ICD-10-CM

## 2023-07-29 DIAGNOSIS — I10 Essential (primary) hypertension: Secondary | ICD-10-CM

## 2023-07-29 NOTE — Progress Notes (Signed)
 [facility]ART [facility]   MULTIDISCIPLINARY HEART VALVE TEAM  Structural Heart Office Note:  .    Date:  07/29/2023  ID:  Yvonne [name], DOB 18-Jan-1941, MRN [id number] PCP: System, Provider Not In  [facility] Providers Cardiologist:  Ahmad Alert, MD  History of Present Illness: .   Yvonne [name] is a 83 y.o. 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    [name] had a stroke at age 29 and is known to have a chronic left internal carotid artery occlusion. She has had right sided arm and leg weakness since her stroke and has neuropathy in both feet. She had a craniotomy in her 38s with removal of a benign tumor.    Her aortic stenosis has been moderate and followed by Dr. Alroy Aspen for several years that eventually worsened into the severe range. She was referred to the  and evaluated by the multidisciplinary valve team and underwent successful TAVR with a 23 mm Edwards Sapien 3 Ultra Resilia THV via the TF approach on 06/26/22. Out of concern for possible HCM versus longstanding sequelae from AS/HTN she was seen by Dr. Paulita Boss with plan for serial echo screening. Genetic testing and CMR were deferred.   On last follow up she was overall doing well but had c/o of  intermittent resting palpitations. Prior ZIO 08/2022 with episodes of SVT and no AF. Metoprolol  added to regimen with plans for close follow up.   Today she is here with her caregiver. She states she has done well with metoprolol  with reduced palpitations. Otherwise she denies chest pain, SOB, LE edema, orthopnea, PND, dizziness, or syncope. Denies bleeding in stool or urine.    Physical Exam:   VS:  BP (!) 154/68 (BP Location: Left Arm)   Pulse  94   Ht 5\' 1"  (1.549 m)   Wt 160 lb (72.6 kg)   SpO2 (!) 50%   BMI 30.23 kg/m    Wt Readings from Last 3 Encounters:  07/29/23 160 lb (72.6 kg)  07/15/23 161 lb 3.2 oz (73.1 kg)  03/24/23 150 lb (68 kg)    General: Well developed, well nourished, NAD Lungs:Clear to ausculation bilaterally. No wheezes, rales, or rhonchi. Breathing is unlabored. Cardiovascular: RRR with S1 S2. No murmurs Extremities: No edema.  Neuro: Alert and oriented. No focal deficits. No facial asymmetry. MAE spontaneously. Psych: Responds to questions appropriately with normal affect.    ASSESSMENT AND PLAN: .    Conduction disease with palpitations: Prior Zio AT showed predominate NSR with 15 episodes of SVT lasting from 5 -19 beats, rare PVCs, PACs, and no significant arrhythmias observed. With reports of intermittent palpitations, metoprolol  was added to her regimen. Reports improved symptoms today therefore will continue. Plan follow up with Dr. Alroy Aspen 11/2023.    Severe AS s/p TAVR: Patient doing well with NYHA class I symptoms s/p TAVR. Echo with hyperdynamic LV function at 65-70% with severe asymmetric LVH and no LV outflow tract gradient, GiDD, small ASD vs large PFO, stable 23mm S3UR TAVR valve with a mean gradient at , EOA 2.53cm2, and DI of 0.52. Continue Asprin 81mg  daily. Patient is edentulous and does not require SBE.   HTN: Slightly elevated today however caregiver reports stable home readings.  Hx of CVA: Continue on aspirin  and statin. No new neuro changes.    Pulmonary nodules: pre TAVR CT showed "two solid pulmonary nodules, largest 0.5 cm. Recommend attention on follow-up chest CT in 3 months given the history of malignancy and absence of prior scans. Follow up imaging 07/2022 with stable nodules. Will have PCP take over follow up for this. Follow chest CTA to evaluate both nodules and TAA.     Thyroid  nodules: pre TAVR CT showed a "hypodense bilateral thyroid  nodules, largest 1.7 cm on the  right. Thyroid  US  07/2022 without worrisome nodule or cyst that did not meet criteria for further testing.    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 Recommend annual imaging followup by CTA or MRA". CTA ordered as above.    Severe basal septal LVH: Saw Dr. Cecil Code with plan for surveillance monitoring. Deferred genetic testing.   I spent 20 minutes caring for this patient today including face-to-face discussions, ordering and reviewing labs, reviewing records from [facility] and other outside facilities, documenting in the record, and arranging for follow up.   Signed, Drema Genta, NP

## 2023-07-29 NOTE — Patient Instructions (Signed)
 Medication Instructions:  Your physician recommends that you continue on your current medications as directed. Please refer to the Current Medication list given to you today.  *If you need a refill on your cardiac medications before your next appointment, please call your pharmacy*   Lab Work: NONE  If you have labs (blood work) drawn today and your tests are completely normal, you will receive your results only by: MyChart Message (if you have MyChart) OR A paper copy in the mail If you have any lab test that is abnormal or we need to change your treatment, we will call you to review the results.   Testing/Procedures: NONE   Follow-Up: At [facility], you and your health needs are our priority.  As part of our continuing mission to provide you with exceptional heart care, we have created designated Provider Care Teams.  These Care Teams include your primary Cardiologist (physician) and Advanced Practice Providers (APPs -  Physician Assistants and Nurse Practitioners) who all work together to provide you with the care you need, when you need it.  We recommend signing up for the patient portal called "MyChart".  Sign up information is provided on this After Visit Summary.  MyChart is used to connect with patients for Virtual Visits (Telemedicine).  Patients are able to view lab/test results, encounter notes, upcoming appointments, etc.  Non-urgent messages can be sent to your provider as well.   To learn more about what you can do with MyChart, go to [email or web address].     Provider:   Ahmad Alert, MD

## 2023-08-12 ENCOUNTER — Telehealth: Payer: Self-pay

## 2023-08-12 DIAGNOSIS — I714 Abdominal aortic aneurysm, without rupture, unspecified: Secondary | ICD-10-CM

## 2023-08-12 DIAGNOSIS — Z01812 Encounter for preprocedural laboratory examination: Secondary | ICD-10-CM

## 2023-08-12 DIAGNOSIS — R911 Solitary pulmonary nodule: Secondary | ICD-10-CM

## 2023-08-12 NOTE — Telephone Encounter (Signed)
-----   Message from Tobey Bride sent at 08/12/2023 10:12 AM EST ----- Regarding: Need labs Patient has an CT scheduled for march 4th.  She will need labs drawn prior.  Please put in order for labs.  Thank you

## 2023-08-12 NOTE — Telephone Encounter (Signed)
Placed order for lab work.  

## 2023-08-15 ENCOUNTER — Other Ambulatory Visit: Payer: Self-pay

## 2023-08-15 ENCOUNTER — Telehealth: Payer: Self-pay

## 2023-08-15 ENCOUNTER — Other Ambulatory Visit (HOSPITAL_COMMUNITY)
Admission: RE | Admit: 2023-08-15 | Discharge: 2023-08-15 | Disposition: A | Discharge status: Home / Self Care | Payer: Medicare HMO | Source: Ambulatory Visit | Attending: *Deleted | Admitting: *Deleted

## 2023-08-15 DIAGNOSIS — Z79899 Other long term (current) drug therapy: Secondary | ICD-10-CM

## 2023-08-15 DIAGNOSIS — Z01812 Encounter for preprocedural laboratory examination: Secondary | ICD-10-CM

## 2023-08-15 DIAGNOSIS — I10 Essential (primary) hypertension: Secondary | ICD-10-CM

## 2023-08-15 DIAGNOSIS — I422 Other hypertrophic cardiomyopathy: Secondary | ICD-10-CM

## 2023-08-15 NOTE — Telephone Encounter (Signed)
 Call from [facility] lab pt has presented there for lab draw.  Offered to replace orders for BMET to be drawn there.  Pt declines and states they will come to [facility] next week prior to CT for BMET.

## 2023-08-15 NOTE — Telephone Encounter (Signed)
 Lab called to release labs

## 2023-08-20 ENCOUNTER — Ambulatory Visit (HOSPITAL_COMMUNITY): Payer: Medicare HMO

## 2023-08-23 ENCOUNTER — Other Ambulatory Visit: Payer: Self-pay

## 2023-08-23 DIAGNOSIS — Z170 Estrogen receptor positive status [ER+]: Secondary | ICD-10-CM

## 2023-08-23 LAB — BASIC METABOLIC PANEL
BUN/Creatinine Ratio: 19 (ref 12–28)
BUN: 14 mg/dL (ref 8–27)
CO2: 20 mmol/L (ref 20–29)
Calcium: 9.6 mg/dL (ref 8.7–10.3)
Chloride: 102 mmol/L (ref 96–106)
Creatinine, Ser: 0.75 mg/dL (ref 0.57–1.00)
Glucose: 172 mg/dL — ABNORMAL HIGH (ref 70–99)
Potassium: 4.9 mmol/L (ref 3.5–5.2)
Sodium: 142 mmol/L (ref 134–144)
eGFR: 78 mL/min/{1.73_m2} (ref >59)

## 2023-08-26 ENCOUNTER — Inpatient Hospital Stay (HOSPITAL_BASED_OUTPATIENT_CLINIC_OR_DEPARTMENT_OTHER): Payer: Medicare HMO | Admitting: Nurse Practitioner

## 2023-08-26 ENCOUNTER — Encounter: Payer: Self-pay | Admitting: Nurse Practitioner

## 2023-08-26 ENCOUNTER — Inpatient Hospital Stay: Payer: Medicare HMO | Attending: Hematology

## 2023-08-26 VITALS — BP 142/80 | HR 63 | Temp 97.6°F | Resp 21 | Ht 61.0 in | Wt 155.5 lb

## 2023-08-26 DIAGNOSIS — C50512 Malignant neoplasm of lower-outer quadrant of left female breast: Secondary | ICD-10-CM

## 2023-08-26 DIAGNOSIS — Z170 Estrogen receptor positive status [ER+]: Secondary | ICD-10-CM | POA: Diagnosis not present

## 2023-08-26 DIAGNOSIS — Z923 Personal history of irradiation: Secondary | ICD-10-CM | POA: Insufficient documentation

## 2023-08-26 DIAGNOSIS — Z1721 Progesterone receptor positive status: Secondary | ICD-10-CM | POA: Insufficient documentation

## 2023-08-26 DIAGNOSIS — Z1732 Human epidermal growth factor receptor 2 negative status: Secondary | ICD-10-CM | POA: Diagnosis not present

## 2023-08-26 DIAGNOSIS — Z79810 Long term (current) use of selective estrogen receptor modulators (SERMs): Secondary | ICD-10-CM | POA: Insufficient documentation

## 2023-08-26 DIAGNOSIS — M8580 Other specified disorders of bone density and structure, unspecified site: Secondary | ICD-10-CM | POA: Insufficient documentation

## 2023-08-26 DIAGNOSIS — R2689 Other abnormalities of gait and mobility: Secondary | ICD-10-CM

## 2023-08-26 LAB — CMP (CANCER CENTER ONLY)
ALT: 18 U/L (ref 0–44)
AST: 37 U/L (ref 15–41)
Albumin: 4 g/dL (ref 3.5–5.0)
Alkaline Phosphatase: 52 U/L (ref 38–126)
Anion gap: 7 (ref 5–15)
BUN: 14 mg/dL (ref 8–23)
CO2: 29 mmol/L (ref 22–32)
Calcium: 9.1 mg/dL (ref 8.9–10.3)
Chloride: 103 mmol/L (ref 98–111)
Creatinine: 0.77 mg/dL (ref 0.44–1.00)
GFR, Estimated: >60 mL/min (ref >60)
Glucose, Bld: 266 mg/dL — ABNORMAL HIGH (ref 70–99)
Potassium: 4.5 mmol/L (ref 3.5–5.1)
Sodium: 139 mmol/L (ref 135–145)
Total Bilirubin: 0.3 mg/dL (ref 0.0–1.2)
Total Protein: 7 g/dL (ref 6.5–8.1)

## 2023-08-26 LAB — CBC WITH DIFFERENTIAL (CANCER CENTER ONLY)
Abs Immature Granulocytes: 0.03 10*3/uL (ref 0.00–0.07)
Basophils Absolute: 0.1 10*3/uL (ref 0.0–0.1)
Basophils Relative: 1 %
Eosinophils Absolute: 0.2 10*3/uL (ref 0.0–0.5)
Eosinophils Relative: 3 %
HCT: 40.4 % (ref 36.0–46.0)
Hemoglobin: 12.8 g/dL (ref 12.0–15.0)
Immature Granulocytes: 0 %
Lymphocytes Relative: 17 %
Lymphs Abs: 1.5 10*3/uL (ref 0.7–4.0)
MCH: 29.1 pg (ref 26.0–34.0)
MCHC: 31.7 g/dL (ref 30.0–36.0)
MCV: 91.8 fL (ref 80.0–100.0)
Monocytes Absolute: 0.6 10*3/uL (ref 0.1–1.0)
Monocytes Relative: 7 %
Neutro Abs: 6.1 10*3/uL (ref 1.7–7.7)
Neutrophils Relative %: 72 %
Platelet Count: 220 10*3/uL (ref 150–400)
RBC: 4.4 MIL/uL (ref 3.87–5.11)
RDW: 14.3 % (ref 11.5–15.5)
WBC Count: 8.4 10*3/uL (ref 4.0–10.5)
nRBC: 0 % (ref 0.0–0.2)

## 2023-08-26 MED ORDER — VENLAFAXINE HCL ER 37.5 MG PO CP24: 37.5000 mg | ORAL_CAPSULE | Freq: Every day | ORAL | 3 refills | Status: DC

## 2023-08-26 MED ORDER — TAMOXIFEN CITRATE 20 MG PO TABS: 20.0000 mg | ORAL_TABLET | Freq: Every day | ORAL | 3 refills | Status: DC

## 2023-08-26 NOTE — Progress Notes (Signed)
 Patient Care Team: System, Provider Not In as PCP - General Nahser, Deloris Ping, MD as PCP - Cardiology (Cardiology) Linna Darner, RD as Dietitian (Family Medicine) Pershing Proud, RN as Oncology Nurse Navigator Donnelly Angelica, RN as Oncology Nurse Navigator Harriette Bouillon, MD as Consulting Physician (General Surgery) Malachy Mood, MD as Consulting Physician (Hematology) Lonie Peak, MD as Attending Physician (Radiation Oncology)   CHIEF COMPLAINT: Follow-up left breast cancer  Oncology History Overview Note  Cancer Staging Malignant neoplasm of lower-outer quadrant of left breast of female, estrogen receptor positive (HCC) Staging form: Breast, AJCC 8th Edition - Clinical stage from 04/25/2021: Stage IIA (cT3, cN0, cM0, G1, ER+, PR+, HER2-) - Signed by Malachy Mood, MD on 04/26/2021    Malignant neoplasm of lower-outer quadrant of left breast of female, estrogen receptor positive (HCC)  04/05/2021 Mammogram   Exam: 3D Mammogram Diagnostic Bilateral Digital - Bilateral Breast Ultrasound - Limited Bilateral  IMPRESSION: The irregular mass in the left breast is highly suspicious of malignancy.   04/19/2021 Pathology Results   Diagnosis  Breast, left, needle core biopsy, mass 7x7x4cm BIRADS5  - INVASIVE MAMMARY CARCINOMA  - MAMMARY CARCINOMA IN SITU Microscopic Comment The biopsy material shows an infiltrative proliferation of cells arranged linearly and in small clusters. Based on the biopsy, the carcinoma appears Nottingham grade 1-2 pf 3 and measures 1.6 cm in greatest linear extent.  E-cadherin is POSITIVE supporting a ductal origin  PROGNOSTIC INDICATORS Results: The tumor cells are EQUIVOCAL for Her2 (2+). Her2 by FISH will be performed and results reported separately. Estrogen Receptor: 100%, POSITIVE, STRONG STAINING INTENSITY Progesterone Receptor: 100%, POSITIVE, STRONG STAINING INTENSITY Proliferation Marker Ki67: 5%  FLUORESCENCE IN-SITU  HYBRIDIZATION Results: GROUP 5: HER2 **NEGATIVE**   04/24/2021 Initial Diagnosis   Malignant neoplasm of lower-outer quadrant of left breast of female, estrogen receptor positive (HCC)   04/25/2021 Cancer Staging   Staging form: Breast, AJCC 8th Edition - Clinical stage from 04/25/2021: Stage IIA (cT3, cN0, cM0, G1, ER+, PR+, HER2-) - Signed by Malachy Mood, MD on 04/26/2021 Stage prefix: Initial diagnosis Histologic grading system: 3 grade system   05/17/2021 Cancer Staging   Staging form: Breast, AJCC 8th Edition - Pathologic stage from 05/17/2021: Stage IB (pT3, pN0, cM0, G2, ER+, PR+, HER2-) - Signed by Malachy Mood, MD on 07/29/2021 Stage prefix: Initial diagnosis Multigene prognostic tests performed: None Histologic grading system: 3 grade system Residual tumor (R): R0 - None   05/17/2021 Definitive Surgery   FINAL MICROSCOPIC DIAGNOSIS:   A. 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  - Anterior margin free.  Tumor approaches to less than 0.1 cm of inked margin.  - Medial margin widely free of tumor.  - Posterior margin free.  Tumor approaches to 0.2 cm of inked margin.  - Ductal carcinoma in situ.   B. BREAST, LEFT ADDITIONAL LATERAL MARGIN, EXCISION:  - Invasive ductal carcinoma involving new lateral margin.  - Usual ductal hyperplasia and fibrocystic changes.  - Intraductal papilloma.  - Old fibroadenoma.   C. BREAST, LEFT ADDITIONAL SUPERIOR MARGIN, EXCISION:  - No carcinoma identified.  New superior margin free.  - Lobular neoplasia (atypical lobular hyperplasia).  - Complex sclerosing lesion.  - Fibrocystic changes and usual ductal hyperplasia.   D. BREAST, LEFT ADDITIONAL MEDIAL MARGIN, EXCISION:  - New medial margin free.  Invasive ductal carcinoma approaches to 0.8 cm of inked margin.  E. BREAST, LEFT ADDITIONAL INFERIOR MARGIN, EXCISION:  - Small fragment of  tissue suspicious for malignancy, 0.2 cm from inked margin.  - New inferior margin free.   F. BREAST, LEFT ADDITIONAL POSTERIOR MARGIN, EXCISION:  - New posterior margin free.  Invasive ductal carcinoma approaches to 0.2 cm of inked margin.   G. BREAST, LEFT ADDITIONAL ANTERIOR MARGIN, EXCISION:  - No malignancy identified.   H. LYMPH NODE, LEFT AXILLARY, SENTINEL, EXCISION:  - One lymph node, negative for carcinoma (0/1).   I. LYMPH NODE, LEFT AXILLARY, SENTINEL, EXCISION:  - One lymph node, negative for carcinoma (0/1).   J. LYMPH NODE, LEFT AXILLARY, SENTINEL, EXCISION:  - One lymph node, negative for carcinoma (0/1).   COMMENT:   The final (new) lateral resection margin is positive for carcinoma.  All other final (new) margins are negative.    07/03/2021 - 07/31/2021 Radiation Therapy   Site Technique Total Dose (Gy) Dose per Fx (Gy) Completed Fx Beam Energies  Breast, Left: Breast_L 3D 40.05/40.05 2.67 15/15 6XFFF, 10XFFF  Breast, Left: Breast_L_Bst 3D 10/10 2 5/5 6X, 10X         CURRENT THERAPY: Tamoxifen, starting 09/01/2021  INTERVAL HISTORY Ms. Yvonne [name] returns with her aide for follow-up as scheduled, last seen by me 02/28/2023.  Tolerating tamoxifen with no noticeable side effects.  She has had a couple falls which she attributes to balance issues from neuropathy.  She completed PT and thinks that has helped.  Denies concerns in her breast such as new lump/mass, nipple discharge or inversion, or skin change.  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 Clifton James & Dr. Laneta Simmers   Severe aortic stenosis    Stroke [facility])    pt was 20     Past Surgical History:  Procedure Laterality Date   ABDOMINAL HYSTERECTOMY     BACK SURGERY     2019   BRAIN SURGERY     age 60's- steel plate in back of head   BREAST LUMPECTOMY WITH RADIOACTIVE SEED AND SENTINEL LYMPH NODE BIOPSY Left 05/17/2021   Procedure: LEFT BREAST SEED LOCALIZED LUMPECTOMY (BRACKETED) WITH SENTINEL LYMPH NODE BIOPSY;  Surgeon: Harriette Bouillon, MD;  Location: [facility] OR;  Service: General;  Laterality: Left;  PEC BLOCK 90 MINUTES ROOM 9   CATARACT EXTRACTION Bilateral    INTRAOPERATIVE TRANSTHORACIC ECHOCARDIOGRAM N/A 06/26/2022   Procedure: INTRAOPERATIVE TRANSTHORACIC ECHOCARDIOGRAM;  Surgeon: Kathleene Hazel, MD;  Location: [facility] OR;  Service: Open Heart Surgery;  Laterality: N/A;   RE-EXCISION OF BREAST LUMPECTOMY Left 06/08/2021   Procedure: RE-EXCISION OF LEFT BREAST LUMPECTOMY;  Surgeon: Harriette Bouillon, MD;  Location: [facility] ORS;  Service: General;  Laterality: Left;   RIGHT HEART CATH AND CORONARY ANGIOGRAPHY N/A 05/17/2022   Procedure: RIGHT HEART CATH AND CORONARY ANGIOGRAPHY;  Surgeon: Verne Carrow  D, MD;  Location: [facility] INVASIVE CV LAB;  Service: Cardiovascular;  Laterality: N/A;   TOE AMPUTATION  2013   2nd toe on right foot, hammer toe   TONSILLECTOMY     TOOTH EXTRACTION     all teeth removed   TOTAL HIP ARTHROPLASTY Left 03/19/2017   Procedure: LEFT TOTAL HIP ARTHROPLASTY ANTERIOR APPROACH;  Surgeon: Kathryne Hitch, MD;  Location: [facility] OR;  Service: Orthopedics;  Laterality: Left;   TRANSCATHETER AORTIC VALVE REPLACEMENT, TRANSFEMORAL N/A 06/26/2022   Procedure: TRANSCATHETR AORTIC VALVE REPLACEMENT, TRANSFEMORAL;  Surgeon: Kathleene Hazel, MD;  Location: [facility] OR;  Service: Open Heart Surgery;  Laterality: N/A;   ULTRASOUND GUIDANCE FOR VASCULAR ACCESS Bilateral 06/26/2022   Procedure: ULTRASOUND GUIDANCE FOR VASCULAR ACCESS;  Surgeon: Kathleene Hazel, MD;  Location[facility] OR;  Service: Open Heart Surgery;  Laterality: Bilateral;     Outpatient Encounter Medications as of 08/26/2023  Medication Sig   acetaminophen (TYLENOL) 500 MG tablet Take 2 tablets (1,000 mg total) by mouth every 6 (six) hours as needed for moderate pain, mild pain, fever or headache.   amitriptyline (ELAVIL) 50 MG tablet Take 50 mg by mouth at bedtime.   aspirin 81 MG chewable tablet Chew 1 tablet (81 mg total) by mouth daily.   atorvastatin (LIPITOR) 20 MG tablet Take 2 tablets (40 mg total) by mouth daily. (Patient taking differently: Take 20 mg by mouth daily.)   gabapentin (NEURONTIN) 100 MG capsule TAKE 1 CAPSULE EVERY MORNING AND TAKE 2 CAPSULES EVERY NIGHT   levothyroxine (SYNTHROID) 88 MCG tablet Take 1 tablet (88 mcg total) by mouth every morning. 30 minutes before food   metFORMIN (GLUCOPHAGE) 500 MG tablet Take 500 mg by mouth daily.   metoprolol tartrate (LOPRESSOR) 25 MG tablet Take 1 tablet (25 mg total) by mouth 2 (two) times daily.   Misc Natural Products (NEURIVA PO) Take 1 tablet by mouth daily.   Misc Natural Products (OSTEO BI-FLEX ADV TRIPLE ST) TABS Take 1 tablet by mouth in the morning. 60mg  of Vitamin C  (Ascorbic Acid); 2mg  Manganese (Manganese Sulfate); 35mg  Sodium; 1500mg  Glucosamine HCI; 100mg  5-Loxin (Boswellia Serrata Extract resin); 1103mg  (Chondroitin/MSM Complex (Chrondroitin Sulfate, Methylsulfonylmethane, Collagen (Hydrolyzed Gelatin), Boswellia Serrata (resin); Boron (Bororganic Glycine); Hyaluronic Acid   Multiple Vitamins-Minerals (CENTRUM SILVER ADULT 50+) TABS Take 1 tablet by mouth in the morning.   [DISCONTINUED] tamoxifen (NOLVADEX) 20 MG tablet Take 1 tablet (20 mg total) by mouth daily.   [DISCONTINUED]  venlafaxine XR (EFFEXOR-XR) 37.5 MG 24 hr capsule TAKE 1 CAPSULE EVERY DAY WITH BREAKFAST   tamoxifen (NOLVADEX) 20 MG tablet Take 1 tablet (20 mg total) by mouth daily.   venlafaxine XR (EFFEXOR-XR) 37.5 MG 24 hr capsule Take 1 capsule (37.5 mg total) by mouth daily with breakfast.   No facility-administered encounter medications on file as of 08/26/2023.     Today's Vitals   08/26/23 1134  BP: (!) 142/80  Pulse: 63  Resp: (!) 21  Temp: 97.6 F (36.4 C)  TempSrc: Temporal  SpO2: 92%  Weight: 155 lb 8 oz (70.5 kg)  Height: 5\' 1"  (1.549 m)   Body mass index is 29.38 kg/m.   PHYSICAL EXAM GENERAL:alert, no distress and comfortable SKIN: no rash  EYES: sclera clear NECK: without mass LYMPH:  no palpable cervical or supraclavicular lymphadenopathy  LUNGS: clear with normal breathing effort HEART: regular rate & rhythm, no lower extremity edema ABDOMEN: abdomen soft, non-tender and normal bowel sounds NEURO: alert & oriented x  3 with fluent speech Breast exam: No nipple discharge or inversion.  S/p left lumpectomy and radiation, incisions completely healed with some distortion and firm scar tissue.  No palpable mass or nodularity in either breast or axilla that I could appreciate   CBC    Component Value Date/Time   WBC 8.4 08/26/2023 1107   WBC 10.9 (H) 04/27/2023 1009   RBC 4.40 08/26/2023 1107   HGB 12.8 08/26/2023 1107   HGB 13.1 05/04/2022 1344   HCT 40.4 08/26/2023 1107   HCT 41.5 05/04/2022 1344   PLT 220 08/26/2023 1107   PLT 338 05/04/2022 1344   MCV 91.8 08/26/2023 1107   MCV 93 05/04/2022 1344   MCH 29.1 08/26/2023 1107   MCHC 31.7 08/26/2023 1107   RDW 14.3 08/26/2023 1107   RDW 13.5 05/04/2022 1344   LYMPHSABS 1.5 08/26/2023 1107   MONOABS 0.6 08/26/2023 1107   EOSABS 0.2 08/26/2023 1107   BASOSABS 0.1 08/26/2023 1107     CMP     Component Value Date/Time   NA 139 08/26/2023 1107   NA 142 08/22/2023 1327   K 4.5 08/26/2023 1107   CL 103  08/26/2023 1107   CO2 29 08/26/2023 1107   GLUCOSE 266 (H) 08/26/2023 1107   BUN 14 08/26/2023 1107   BUN 14 08/22/2023 1327   CREATININE 0.77 08/26/2023 1107   CREATININE 0.83 07/25/2016 1155   CALCIUM 9.1 08/26/2023 1107   PROT 7.0 08/26/2023 1107   PROT 6.9 07/31/2019 0756   ALBUMIN 4.0 08/26/2023 1107   ALBUMIN 4.2 07/31/2019 0756   AST 37 08/26/2023 1107   ALT 18 08/26/2023 1107   ALKPHOS 52 08/26/2023 1107   BILITOT 0.3 08/26/2023 1107   GFRNONAA >60 08/26/2023 1107   GFRAA 72 07/31/2019 0756     ASSESSMENT & PLAN:Yvonne [name] is a 42 post menopausal female    1. 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 Luisa Hart showed: 5.5 cm IDC with DCIS. Final lateral margin was positive. Other margins and lymph nodes negative. -she received radiation under Dr. Basilio Cairo 1/16-2/13/23. -she started tamoxifen on 09/01/21, tolerating well without significant side effects -Mammogram 04/15/2023 is benign -Ms. Dusseau is clinically doing well.  Exam is benign, labs are unremarkable, overall no clinical concern for recurrence  -Continue breast cancer surveillance and tamoxifen, refilled -Follow-up in 6 months, or sooner if needed  2. Bone Health -repeat DEXA on 05/01/21 showed osteopenia (T-score of -2.0). -we previously discussed tamoxifen can strengthen her bones.  3. Neuropathy -Patient is unsure of the source of her neuropathy, which may be from hyperglycemia, back issues and prior spinal surgeries, and/or other -She feels this is contributing to imbalance and falls, recently completed PT which helped -She would like a referral to neurology for further management   PLAN: -Last mammogram and today's labs reviewed -Continue breast cancer surveillance and tamoxifen, refilled -Neurology referral for neuropathy per pt request -F/up in 6 months, or sooner if  needed  Orders Placed This Encounter  Procedures   Ambulatory referral to Neurology    Referral Priority:   Routine    Referral Type:   Consultation    Referral Reason:   Specialty Services Required    Requested Specialty:   Neurology    Number of Visits Requested:   1      All questions were answered. The patient knows to call the clinic  with any problems, questions or concerns. No barriers to learning were detected. I spent 20 minutes counseling the patient face to face. The total time spent in the appointment was 30 mimutes and more than 50% was on counseling, review of test results, and coordination of care.   Santiago Glad, NP-C 08/26/2023

## 2023-08-27 ENCOUNTER — Encounter: Payer: Self-pay | Admitting: *Deleted

## 2023-08-30 ENCOUNTER — Ambulatory Visit (HOSPITAL_COMMUNITY)
Admission: RE | Admit: 2023-08-30 | Discharge: 2023-08-30 | Disposition: A | Discharge status: Home / Self Care | Source: Ambulatory Visit | Attending: Cardiology | Admitting: Cardiology

## 2023-08-30 DIAGNOSIS — R911 Solitary pulmonary nodule: Secondary | ICD-10-CM | POA: Diagnosis present

## 2023-08-30 DIAGNOSIS — I714 Abdominal aortic aneurysm, without rupture, unspecified: Secondary | ICD-10-CM | POA: Insufficient documentation

## 2023-08-30 MED ORDER — IOHEXOL 350 MG/ML SOLN
75.0000 mL | Freq: Once | INTRAVENOUS | Status: AC | PRN
  Administered 2023-08-30: 75 mL via INTRAVENOUS

## 2023-09-18 ENCOUNTER — Telehealth: Payer: Self-pay

## 2023-09-18 NOTE — Telephone Encounter (Signed)
 I have attempted to contact this patient by phone with the following results but the voicemail is full to leave a message. I will continue to try later

## 2023-12-08 ENCOUNTER — Encounter: Payer: Self-pay | Admitting: Cardiovascular Disease

## 2023-12-08 NOTE — Progress Notes (Unsigned)
 Yvonne [name] Date of Birth  June 21, 1940 [facility]     [place] Office  [address]    [address]   [address] [place], [place]  [address]    [place], [place]  [address] [phone]  Fax  [phone]  [phone]  Fax [phone]   Problem List: 1. Mild AS / AI  2. Hypothyroidism 3. Hypertension 4. Toe amputation for hammer toe 5. History of stroke- occluded left carotid, mild right carotid disease 6. CVA - age 2      Pt is a 83 y.o. female with a hx of stroke at age 48.  She continues to have problems with her right arm.  She has had a heart murmur for years.  She has not had any recent problems.  She has a hx of HTN.  She admits to eating extra salt on occasion.  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 This corresponds with her previous stroke. There was no significant disease on right carotid. An echocardiogram revealed normal left ventricular systolic function.  She has mild aortic stenosis and aortic insufficiency which explains her heart murmur.  She eats lots of salty meals.  She eats prepared lunch and dinners every day.  She eats popcorn every night.    December 08, 2014:    Yvonne [name] is seen back after a 3 year absence.  Still eats some salt .  She is doing well.  She fell the other day.   misstepped on the cement.    December 12, 2015:  Doing well. Is now walking with a walker  Doing well from a cardiac standpoint .  Aug. 20, 2018  Yvonne [name] is seen today  Is walking with a walker for the past year BP looks good today    Aug. 26, 2019:  Yvonne [name] is seen today  Doing well Hx of a stroke at age 54.  Has HTN,  Still eats some some salt.   Jan. 13, 2023 Yvonne [name] is seen today for follow up of her aortic stenosis,  HTN,    Has mod. Severe AS  Has seen McAlhany -  is waiting for her to develop symptoms before proceeding with TAVR  Was supposed to be seen on Jan. 11 - she cancelled.  ( Was  supposed to see McAlhany today from what I can tell )    Has some palpitations at night   Just diagnosed with left  breast cancer  Is doing XRT   Oct. 24, 2023 Yvonne [name] is seen for follow up of her AS  Has seen Dr. RANDEL in the TAVR clinic  Is scheduled for TAVR clinic in 2-3 weeks  Is not feeling well today  Clemens twice in the last 10 days . Lots of bruising  Breathing is ok  Is seeing TAVR clinic every 6 months   December 09, 2023 Yvonne [name] is seen for follow up of her AS  She had TAVR in Jan. 2024 She was found to have several lung nodules on her pre TAVR CT scan  She has been followed by Oncology for metastatic breast cancer   ? Consider referral to pulmonary medicine         Current Outpatient Medications on File Prior to Visit  Medication Sig Dispense Refill   acetaminophen  (TYLENOL ) 500 MG tablet Take 2 tablets (1,000 mg total) by mouth every 6 (six) hours as needed for moderate pain, mild pain, fever or  headache.     amitriptyline  (ELAVIL ) 50 MG tablet Take 50 mg by mouth at bedtime.     aspirin  81 MG chewable tablet Chew 1 tablet (81 mg total) by mouth daily. 90 tablet 3   atorvastatin  (LIPITOR) 20 MG tablet Take 2 tablets (40 mg total) by mouth daily. (Patient taking differently: Take 20 mg by mouth daily.) 90 tablet 3   gabapentin  (NEURONTIN ) 100 MG capsule TAKE 1 CAPSULE EVERY MORNING AND TAKE 2 CAPSULES EVERY NIGHT 270 capsule 3   levothyroxine  (SYNTHROID ) 88 MCG tablet Take 1 tablet (88 mcg total) by mouth every morning. 30 minutes before food 90 tablet 0   metFORMIN (GLUCOPHAGE) 500 MG tablet Take 500 mg by mouth daily.     metoprolol  tartrate (LOPRESSOR ) 25 MG tablet Take 1 tablet (25 mg total) by mouth 2 (two) times daily. 180 tablet 3   Misc Natural Products (NEURIVA PO) Take 1 tablet by mouth daily.     Misc Natural Products (OSTEO BI-FLEX ADV TRIPLE ST) TABS Take 1 tablet by mouth in the morning. 60mg  of Vitamin C  (Ascorbic Acid); 2mg  Manganese (Manganese Sulfate); 35mg   Sodium; 1500mg  Glucosamine HCI; 100mg  5-Loxin (Boswellia Serrata Extract resin); 1103mg  (Chondroitin/MSM Complex (Chrondroitin Sulfate, Methylsulfonylmethane, Collagen (Hydrolyzed Gelatin), Boswellia Serrata (resin); Boron (Bororganic Glycine); Hyaluronic Acid     Multiple Vitamins-Minerals (CENTRUM SILVER ADULT 50+) TABS Take 1 tablet by mouth in the morning.     tamoxifen  (NOLVADEX ) 20 MG tablet Take 1 tablet (20 mg total) by mouth daily. 90 tablet 3   venlafaxine  XR (EFFEXOR -XR) 37.5 MG 24 hr capsule Take 1 capsule (37.5 mg total) by mouth daily with breakfast. 90 capsule 3   No current facility-administered medications on file prior to visit.    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 Verlin & Dr. Lucas   Severe aortic stenosis    Stroke [facility])    pt was 42    Past Surgical History:  Procedure Laterality Date   ABDOMINAL HYSTERECTOMY     BACK SURGERY     2019   BRAIN SURGERY     age 77's- steel plate in back of head   BREAST LUMPECTOMY WITH RADIOACTIVE SEED AND SENTINEL LYMPH NODE BIOPSY Left 05/17/2021   Procedure: LEFT BREAST SEED LOCALIZED LUMPECTOMY (BRACKETED) WITH SENTINEL LYMPH NODE BIOPSY;  Surgeon: Vanderbilt Ned, MD;  Location: [facility] OR;  Service: General;  Laterality: Left;  PEC BLOCK 90 MINUTES ROOM 9   CATARACT EXTRACTION Bilateral    INTRAOPERATIVE  TRANSTHORACIC ECHOCARDIOGRAM N/A 06/26/2022   Procedure: INTRAOPERATIVE TRANSTHORACIC ECHOCARDIOGRAM;  Surgeon: Verlin Lonni BIRCH, MD;  Location: [facility] OR;  Service: Open Heart Surgery;  Laterality: N/A;   RE-EXCISION OF BREAST LUMPECTOMY Left 06/08/2021   Procedure: RE-EXCISION OF LEFT BREAST LUMPECTOMY;  Surgeon: Vanderbilt Ned, MD;  Location: [facility] ORS;  Service: General;  Laterality: Left;   RIGHT HEART CATH AND CORONARY ANGIOGRAPHY N/A 05/17/2022   Procedure: RIGHT HEART CATH AND CORONARY ANGIOGRAPHY;  Surgeon: Verlin Lonni BIRCH, MD;  Location: [facility] INVASIVE CV LAB;  Service: Cardiovascular;  Laterality: N/A;   TOE AMPUTATION  2013   2nd toe on right foot, hammer toe   TONSILLECTOMY     TOOTH EXTRACTION     all teeth removed   TOTAL HIP ARTHROPLASTY Left 03/19/2017   Procedure: LEFT TOTAL HIP ARTHROPLASTY ANTERIOR APPROACH;  Surgeon: Vernetta Lonni GRADE, MD;  Location: [facility] OR;  Service: Orthopedics;  Laterality: Left;   TRANSCATHETER AORTIC VALVE REPLACEMENT, TRANSFEMORAL N/A 06/26/2022   Procedure: TRANSCATHETR AORTIC VALVE REPLACEMENT, TRANSFEMORAL;  Surgeon: Verlin Lonni BIRCH, MD;  Location: [facility] OR;  Service: Open Heart Surgery;  Laterality: N/A;   ULTRASOUND GUIDANCE FOR VASCULAR ACCESS Bilateral 06/26/2022   Procedure: ULTRASOUND GUIDANCE FOR VASCULAR ACCESS;  Surgeon: Verlin Lonni BIRCH, MD;  Location: [facility] OR;  Service: Open Heart Surgery;  Laterality: Bilateral;    Social History   Tobacco Use  Smoking Status Never  Smokeless Tobacco Never    Social History   Substance and Sexual Activity  Alcohol Use Not Currently   Alcohol/week: 1.0 standard drink of alcohol   Types: 1 Glasses of wine per week    Family History  Problem Relation Age of Onset   Thyroid  disease Mother    Heart disease Father     Reviw of Systems:  Noted is current hx     Physical Exam: There were no vitals taken for this visit.  No BP recorded.  {Refresh Note OR Click here to  enter BP  :1}***    GEN:  Well nourished, well developed in no acute distress HEENT: Normal NECK: No JVD; No carotid bruits LYMPHATICS: No lymphadenopathy CARDIAC: RRR ***, no murmurs, rubs, gallops RESPIRATORY:  Clear to auscultation without rales, wheezing or rhonchi  ABDOMEN: Soft, non-tender, non-distended MUSCULOSKELETAL:  No edema; No deformity  SKIN: Warm and dry NEUROLOGIC:  Alert and oriented x 3    ECG:   .    Assessment / Plan:   1. Aortic stenosis :       2. Hypothyroidism -    3. Hypertension -     4. Toe amputation for hammer toe   5. History of stroke-     Aleene Passe, MD  12/08/2023 7:28 AM    [facility] [address] [place],  [address] [place], [place]  [address] Pager [phone] Phone: [phone]; Fax: [phone]

## 2023-12-09 ENCOUNTER — Ambulatory Visit: Payer: Medicare HMO | Attending: Cardiovascular Disease | Admitting: Cardiovascular Disease

## 2023-12-09 ENCOUNTER — Encounter: Payer: Self-pay | Admitting: Cardiovascular Disease

## 2023-12-09 VITALS — BP 126/82 | HR 88 | Ht 61.0 in | Wt 145.6 lb

## 2023-12-09 DIAGNOSIS — Z952 Presence of prosthetic heart valve: Secondary | ICD-10-CM

## 2023-12-09 DIAGNOSIS — R911 Solitary pulmonary nodule: Secondary | ICD-10-CM | POA: Diagnosis not present

## 2023-12-09 NOTE — Patient Instructions (Signed)
 Testing/Procedures: Ambulatory referral to pulmonary nodule clinic   Follow-Up: At [facility], you and your health needs are our priority.  As part of our continuing mission to provide you with exceptional heart care, our providers are all part of one team.  This team includes your primary Cardiologist (physician) and Advanced Practice Providers or APPs (Physician Assistants and Nurse Practitioners) who all work together to provide you with the care you need, when you need it.  Your next appointment:   1 year(s)  Provider:   Aleene Passe, MD    We recommend signing up for the patient portal called MyChart.  Sign up information is provided on this After Visit Summary.  MyChart is used to connect with patients for Virtual Visits (Telemedicine).  Patients are able to view lab/test results, encounter notes, upcoming appointments, etc.  Non-urgent messages can be sent to your provider as well.   To learn more about what you can do with MyChart, go to [email or web address].

## 2023-12-13 ENCOUNTER — Encounter: Payer: Self-pay | Admitting: Diagnostic Neuroimaging

## 2023-12-13 ENCOUNTER — Ambulatory Visit (INDEPENDENT_AMBULATORY_CARE_PROVIDER_SITE_OTHER): Admitting: Diagnostic Neuroimaging

## 2023-12-13 ENCOUNTER — Ambulatory Visit: Admitting: Diagnostic Neuroimaging

## 2023-12-13 VITALS — BP 127/65 | HR 51 | Ht 61.0 in | Wt 147.5 lb

## 2023-12-13 DIAGNOSIS — E1142 Type 2 diabetes mellitus with diabetic polyneuropathy: Secondary | ICD-10-CM

## 2023-12-13 DIAGNOSIS — Z7984 Long term (current) use of oral hypoglycemic drugs: Secondary | ICD-10-CM | POA: Diagnosis not present

## 2023-12-13 NOTE — Patient Instructions (Signed)
  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

## 2023-12-13 NOTE — Progress Notes (Signed)
 [facility]  PATIENT: Yvonne [name] DOB: [birth year]  REFERRING CLINICIAN: Burton, Lacie K, NP HISTORY FROM: patient  REASON FOR VISIT: new consult   HISTORICAL  CHIEF COMPLAINT:  Chief Complaint  Patient presents with   New Patient (Initial Visit)    Pt in room 6 Andi caretaker in room..Internal referral for neuropathy and balance issues. Pt reports her feet feel heavy, makes it hard to walk. Pt reports she has to hold on things when walking. No falls in 3 months.     HISTORY OF PRESENT ILLNESS:   83 year old female with diabetes here for evaluation of numbness tingling pain in feet.  Symptoms been present at least 8 to 10 years.  Was having progressive numbness and gait difficulty.  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  Symptoms gradual progressed over time.  She also has pain in the bottom of her feet, gait difficulty, numbness.  No symptoms in fingers or hands.   REVIEW OF SYSTEMS: Full 14 system review of systems performed and negative with exception of: as per HPI.  ALLERGIES: No Known Allergies  HOME MEDICATIONS: Outpatient Medications Prior to Visit  Medication Sig Dispense Refill   acetaminophen  (TYLENOL ) 500 MG tablet Take 2 tablets (1,000 mg total) by mouth every 6 (six) hours as needed for moderate pain, mild pain, fever or headache.     amitriptyline  (ELAVIL ) 50 MG tablet Take 50 mg by mouth at bedtime.     amLODipine  (NORVASC ) 5 MG tablet Take 5 mg by mouth daily.     aspirin  81 MG chewable tablet Chew 1 tablet (81 mg total) by mouth daily. 90 tablet 3   atorvastatin  (LIPITOR) 20 MG tablet Take 2 tablets (40 mg total) by mouth daily. (Patient taking differently: Take 20 mg by mouth daily.) 90 tablet 3   benazepril  (LOTENSIN ) 40 MG tablet Take 40 mg by mouth daily.     gabapentin  (NEURONTIN ) 100 MG capsule TAKE 1 CAPSULE EVERY MORNING AND TAKE 2 CAPSULES EVERY NIGHT  270 capsule 3   levothyroxine  (SYNTHROID ) 88 MCG tablet Take 1 tablet (88 mcg total) by mouth every morning. 30 minutes before food 90 tablet 0   metoprolol  tartrate (LOPRESSOR ) 25 MG tablet Take 1 tablet (25 mg total) by mouth 2 (two) times daily. 180 tablet 3   Misc Natural Products (NEURIVA PO) Take 1 tablet by mouth daily.     Misc Natural Products (OSTEO BI-FLEX ADV TRIPLE ST) TABS Take 1 tablet by mouth in the morning. 60mg  of Vitamin C  (Ascorbic Acid); 2mg  Manganese (Manganese Sulfate); 35mg  Sodium; 1500mg  Glucosamine HCI; 100mg  5-Loxin (Boswellia Serrata Extract resin); 1103mg  (Chondroitin/MSM Complex (Chrondroitin Sulfate, Methylsulfonylmethane, Collagen (Hydrolyzed Gelatin), Boswellia Serrata (resin); Boron (Bororganic Glycine); Hyaluronic Acid     Multiple Vitamins-Minerals (CENTRUM SILVER ADULT 50+) TABS Take 1 tablet by mouth in the morning.     tamoxifen  (NOLVADEX ) 20 MG tablet Take 1 tablet (20 mg total) by mouth daily. 90 tablet 3   venlafaxine  XR (EFFEXOR -XR) 37.5 MG 24 hr capsule Take 1 capsule (37.5 mg total) by mouth daily with breakfast. 90 capsule 3   metFORMIN (GLUCOPHAGE) 500 MG tablet Take 500 mg by mouth daily. (Patient not taking: Reported on 12/13/2023)     No facility-administered medications prior to visit.    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  Verlin & Dr. Lucas   Severe aortic stenosis    Stroke [facility])    pt was 40    PAST SURGICAL HISTORY: Past Surgical History:  Procedure Laterality Date   ABDOMINAL HYSTERECTOMY     BACK SURGERY     2019   BRAIN SURGERY     age 87's- steel plate in back of head   BREAST LUMPECTOMY WITH RADIOACTIVE SEED AND SENTINEL LYMPH NODE BIOPSY Left 05/17/2021   Procedure: LEFT BREAST SEED LOCALIZED LUMPECTOMY (BRACKETED) WITH SENTINEL LYMPH NODE BIOPSY;  Surgeon: Vanderbilt Ned, MD;  Location: [facility] OR;  Service: General;  Laterality: Left;  PEC BLOCK 90 MINUTES ROOM 9   CATARACT EXTRACTION Bilateral    INTRAOPERATIVE TRANSTHORACIC ECHOCARDIOGRAM N/A 06/26/2022   Procedure: INTRAOPERATIVE TRANSTHORACIC ECHOCARDIOGRAM;  Surgeon: Verlin Lonni BIRCH, MD;  Location: [facility] OR;  Service: Open Heart Surgery;  Laterality: N/A;   RE-EXCISION OF BREAST LUMPECTOMY Left 06/08/2021   Procedure: RE-EXCISION OF LEFT BREAST LUMPECTOMY;  Surgeon: Vanderbilt Ned, MD;  Location: [facility] ORS;  Service: General;  Laterality: Left;   RIGHT HEART CATH AND CORONARY ANGIOGRAPHY N/A 05/17/2022   Procedure: RIGHT HEART CATH AND CORONARY ANGIOGRAPHY;  Surgeon: Verlin Lonni BIRCH, MD;  [facility] INVAS[facility] LAB;  Service: Cardiovascular;  Laterality: N/A;   TOE AMPUTATION  2013   2nd toe on right foot, hammer toe   TONSILLECTOMY     TOOTH EXTRACTION     all teeth removed   TOTAL HIP ARTHROPLASTY Left 03/19/2017   Procedure: LEFT TOTAL HIP ARTHROPLASTY ANTERIOR APPROACH;  Surgeon: Vernetta Lonni GRADE, MD;  Location: [facility] OR;  Service: Orthopedics;  Laterality: Left;   TRANSCATHETER AORTIC VALVE REPLACEMENT, TRANSFEMORAL N/A 06/26/2022   Procedure: TRANSCATHETR AORTIC VALVE REPLACEMENT, TRANSFEMORAL;  Surgeon: Verlin Lonni BIRCH, MD;  Location: [facility] OR;  Service: Open Heart Surgery;  Laterality: N/A;   ULTRASOUND GUIDANCE FOR VASCULAR ACCESS Bilateral 06/26/2022   Procedure: ULTRASOUND GUIDANCE FOR VASCULAR ACCESS;   Surgeon: Verlin Lonni BIRCH, MD;  Location: [facility] OR;  Service: Open Heart Surgery;  Laterality: Bilateral;    FAMILY HISTORY: Family History  Problem Relation Age of Onset   Thyroid  disease Mother    Heart disease Father     SOCIAL HISTORY: Social History   Socioeconomic History   Marital status: Widowed    Spouse name: Not on file   Number of children: 2   Years of education: masters   Highest education level: Not on file  Occupational History   Occupation: Government social research officer: RETIRED  Tobacco Use   Smoking status: Never   Smokeless tobacco: Never  Vaping Use   Vaping status: Never Used  Substance and Sexual Activity   Alcohol use: Not Currently    Alcohol/week: 1.0 standard drink of alcohol    Types: 1 Glasses of wine per week   Drug use: No   Sexual activity: Not Currently    Birth  control/protection: Post-menopausal  Other Topics Concern   Not on file  Social History Narrative   Health Care POA:    Emergency Contact: son, Norleen Knoll, (c) [phone]   End of Life Plan:    Who lives with you: lives alone now    Any pets: 2 cats, Sadie and Katie   Diet: Pt has a varied diet and tries to limit sweets.    Exercise: Pt walks on treadmill 10 minutes a day and uses bike 10 minutes a day.   Seatbelts: Pt reports wearing seatbelt when in vehicles.    Sun Exposure/Protection: Pt does not use sun protection.   Hobbies: reading, watching movies, writing      Husband passed away about 10 years ago    Social Drivers of Corporate investment banker Strain: Low Risk  (06/23/2021)   Overall Financial Resource Strain (CARDIA)    Difficulty of Paying Living Expenses: Not very hard  Food Insecurity: No Food Insecurity (03/25/2023)   Hunger Vital Sign    Worried About Running Out of Food in the Last Year: Never true    Ran Out of Food in the Last Year: Never true  Transportation Needs: No Transportation Needs (03/25/2023)   PRAPARE - Scientist, research (physical sciences) (Medical): No    Lack of Transportation (Non-Medical): No  Physical Activity: Unknown (06/07/2018)   Exercise Vital Sign    Days of Exercise per Week: Patient declined    Minutes of Exercise per Session: Patient declined  Stress: No Stress Concern Present (06/23/2021)   [employer] of Occupational Health - Occupational Stress Questionnaire    Feeling of Stress : Only a little  Social Connections: Moderately Integrated (06/23/2021)   Social Connection and Isolation Panel    Frequency of Communication with Friends and Family: More than three times a week    Frequency of Social Gatherings with Friends and Family: Once a week    Attends Religious Services: More than 4 times per year    Active Member of [employer] or Organizations: Yes    Attends Banker Meetings: Never    Marital Status: Widowed  Intimate Partner Violence: Not At Risk (03/25/2023)   Humiliation, Afraid, Rape, and Kick questionnaire    Fear of Current or Ex-Partner: No    Emotionally Abused: No    Physically Abused: No    Sexually Abused: No     PHYSICAL EXAM  GENERAL EXAM/CONSTITUTIONAL: Vitals:  Vitals:   12/13/23 1134  BP: 127/65  Pulse: (!) 51  SpO2: 96%  Weight: 147 lb 8 oz (66.9 kg)  Height: 5' 1 (1.549 m)   Body mass index is 27.87 kg/m. Wt Readings from Last 3 Encounters:  12/13/23 147 lb 8 oz (66.9 kg)  12/09/23 145 lb 9.6 oz (66 kg)  08/26/23 155 lb 8 oz (70.5 kg)   Patient is in no distress; well developed, nourished and groomed; neck is supple  CARDIOVASCULAR: Examination of carotid arteries is normal; no carotid bruits Regular rate and rhythm, no murmurs Examination of peripheral vascular system by observation and palpation is normal  EYES: Ophthalmoscopic exam of optic discs and posterior segments is normal; no papilledema or hemorrhages No results found.  MUSCULOSKELETAL: Gait, strength, tone, movements noted in Neurologic exam below  NEUROLOGIC: MENTAL  STATUS:     05/04/2011   11:00 AM  MMSE - Mini Mental State Exam  Orientation to time 5   Orientation to Place 5   Registration 3  Attention/ Calculation 3   Recall 2   Language- name 2 objects 2   Language- repeat 1  Language- follow 3 step command 3   Language- read & follow direction 1   Write a sentence 1   Copy design 1   Total score 27      Data saved with a previous flowsheet row definition   awake, alert, oriented to person, place and time recent and remote memory intact normal attention and concentration language fluent, comprehension intact, naming intact fund of knowledge appropriate  CRANIAL NERVE:  2nd - no papilledema on fundoscopic exam 2nd, 3rd, 4th, 6th - pupils equal and reactive to light, visual fields full to confrontation, extraocular muscles intact, no nystagmus 5th - facial sensation symmetric 7th - facial strength symmetric 8th - hearing intact 9th - palate elevates symmetrically, uvula midline 11th - shoulder shrug symmetric 12th - tongue protrusion midline  MOTOR:  normal bulk and tone, full strength in the BUE, BLE  SENSORY:  normal and symmetric to light touch, temperature, vibration; DECR IN FEET / ANKLES  COORDINATION:  finger-nose-finger, fine finger movements normal  REFLEXES:  deep tendon reflexes TRACE and symmetric; ABSENT IN ANKLES  GAIT/STATION:  narrow based gait; ANTALGIC UNSTEADY GAIT     DIAGNOSTIC DATA (LABS, IMAGING, TESTING) - I reviewed patient records, labs, notes, testing and imaging myself where available.  Lab Results  Component Value Date   WBC 8.4 08/26/2023   HGB 12.8 08/26/2023   HCT 40.4 08/26/2023   MCV 91.8 08/26/2023   PLT 220 08/26/2023      Component Value Date/Time   NA 139 08/26/2023 1107   NA 142 08/22/2023 1327   K 4.5 08/26/2023 1107   CL 103 08/26/2023 1107   CO2 29 08/26/2023 1107   GLUCOSE 266 (H) 08/26/2023 1107   BUN 14 08/26/2023 1107   BUN 14 08/22/2023 1327   CREATININE  0.77 08/26/2023 1107   CREATININE 0.83 07/25/2016 1155   CALCIUM  9.1 08/26/2023 1107   PROT 7.0 08/26/2023 1107   PROT 6.9 07/31/2019 0756   ALBUMIN  4.0 08/26/2023 1107   ALBUMIN  4.2 07/31/2019 0756   AST 37 08/26/2023 1107   ALT 18 08/26/2023 1107   ALKPHOS 52 08/26/2023 1107   BILITOT 0.3 08/26/2023 1107   GFRNONAA >60 08/26/2023 1107   GFRAA 72 07/31/2019 0756   Lab Results  Component Value Date   CHOL 126 03/25/2023   HDL 40 (L) 03/25/2023   LDLCALC 52 03/25/2023   LDLDIRECT 57 10/20/2020   TRIG 168 (H) 03/25/2023   CHOLHDL 3.2 03/25/2023   Lab Results  Component Value Date   HGBA1C 7.4 (H) 03/25/2023   Lab Results  Component Value Date   VITAMINB12 923 10/20/2015   Lab Results  Component Value Date   TSH 1.868 03/25/2023    04/29/18 CT lumbar spine [I reviewed images myself and agree with interpretation. -VRP]  1.  No acute osseous injury of the lumbar spine. 2. At L4-5 there is a large central disc extrusion with cephalad migration of disc material and mineralization of the disc. Severe bilateral facet arthropathy. Severe spinal stenosis. Moderate bilateral foraminal stenosis. 3.  Aortic Atherosclerosis (ICD10-I70.0)    ASSESSMENT AND PLAN  83 y.o. year old female here with:   Dx:  1. 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    EDUARD FABIENE HANLON, MD 12/13/2023, 12:21 PM Certified in Neurology, Neurophysiology and Neuroimaging  [facility] [address], [address] [place], [place] [address] [phone]

## 2023-12-16 ENCOUNTER — Ambulatory Visit: Admitting: Neurology

## 2024-02-17 NOTE — Progress Notes (Unsigned)
 Yvonne [name], female    DOB: [birth year]   MRN: [id number]   Brief patient profile:  36 yowf  never regular smoker  referred to pulmonary clinic 02/19/2024 by cardiology  for MPNs / chronic cough        Pt not previously seen by PCCM service.     History of Present Illness  02/19/2024  Pulmonary/ 1st office eval/Felesia Stahlecker  Chief Complaint  Patient presents with   Consult    Patient states no sx noted.  Referred for pulmonary nodule found on CT March 2025 by Dr. Campbell, PCP.  Dyspnea:  Not limited by breathing from desired activities  / tends to fall so has 4 pronged cane  Still goes shopping at [employer] albeit awkward pace  Cough: dry raspy cough day > noct   x months s assoc pnds / sinus complaints  Sleep: flat bad with one pillow SABA use: none  02 ldz:[id number]  none  No obvious day to day or daytime pattern/variability or assoc excess/ purulent sputum or mucus plugs or hemoptysis or cp or chest tightness, subjective wheeze or overt sinus or hb symptoms.    Also denies any obvious fluctuation of symptoms with weather or environmental changes or other aggravating or alleviating factors except as outlined above   No unusual exposure hx or h/o childhood pna/ asthma or knowledge of premature birth.  Current Allergies, Complete Past Medical History, Past Surgical History, Family History, and Social History were reviewed in [employer] record.  ROS  The following are not active complaints unless bolded Hoarseness, sore throat, dysphagia, dental problems, itching, sneezing,  nasal congestion or discharge of excess mucus or purulent secretions, ear ache,   fever, chills, sweats, unintended wt loss or wt gain, classically pleuritic or exertional cp,  orthopnea pnd or arm/hand swelling  or leg swelling, presyncope, palpitations, abdominal pain, anorexia, nausea, vomiting, diarrhea  or change in bowel habits or change in bladder habits, change in stools or change in urine,  dysuria, hematuria,  rash, arthralgias, visual complaints, headache, numbness, weakness or ataxia or problems with walking or coordination,  change in mood or  memory.             Outpatient Medications Prior to Visit  Medication Sig Dispense Refill   acetaminophen  (TYLENOL ) 500 MG tablet Take 2 tablets (1,000 mg total) by mouth every 6 (six) hours as needed for moderate pain, mild pain, fever or headache.     amitriptyline  (ELAVIL ) 25 MG tablet Take 25 mg by mouth at bedtime.     amLODipine  (NORVASC ) 5 MG tablet Take 5 mg by mouth daily.     aspirin  81 MG chewable tablet Chew 1 tablet (81 mg total) by mouth daily. 90 tablet 3   atorvastatin  (LIPITOR) 20 MG tablet Take 2 tablets (40 mg total) by mouth daily. 90 tablet 3   benazepril  (LOTENSIN ) 40 MG tablet Take 40 mg by mouth daily.     gabapentin  (NEURONTIN ) 100 MG capsule TAKE 1 CAPSULE EVERY MORNING AND TAKE 2 CAPSULES EVERY NIGHT 270 capsule 3   levothyroxine  (SYNTHROID ) 88 MCG tablet Take 1 tablet (88 mcg total) by mouth every morning. 30 minutes before food 90 tablet 0   LINZESS 72 MCG capsule Take 72 mcg by mouth every morning.     metFORMIN (GLUCOPHAGE) 500 MG tablet Take 500 mg by mouth daily.     Misc Natural Products (OSTEO BI-FLEX ADV TRIPLE ST) TABS Take 1 tablet by mouth in the morning.  60mg  of Vitamin C  (Ascorbic Acid); 2mg  Manganese (Manganese Sulfate); 35mg  Sodium; 1500mg  Glucosamine HCI; 100mg  5-Loxin (Boswellia Serrata Extract resin); 1103mg  (Chondroitin/MSM Complex (Chrondroitin Sulfate, Methylsulfonylmethane, Collagen (Hydrolyzed Gelatin), Boswellia Serrata (resin); Boron (Bororganic Glycine); Hyaluronic Acid     Multiple Vitamins-Minerals (CENTRUM SILVER ADULT 50+) TABS Take 1 tablet by mouth in the morning.     tamoxifen  (NOLVADEX ) 20 MG tablet Take 1 tablet (20 mg total) by mouth daily. 90 tablet 3   Misc Natural Products (NEURIVA PO) Take 1 tablet by mouth daily. (Patient not taking: Reported on 02/19/2024)      amitriptyline  (ELAVIL ) 50 MG tablet Take 50 mg by mouth at bedtime.     metoprolol  tartrate (LOPRESSOR ) 25 MG tablet Take 1 tablet (25 mg total) by mouth 2 (two) times daily. 180 tablet 3   venlafaxine  XR (EFFEXOR -XR) 37.5 MG 24 hr capsule Take 1 capsule (37.5 mg total) by mouth daily with breakfast. 90 capsule 3   No facility-administered medications prior to visit.    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 Verlin & Dr. 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    HEENT : Oropharynx  clear s cobblestoning or excess pnd      Nasal turbinates nl / spont cough is raspy   NECK :  without  apparent JVD/ palpable Nodes/TM  - mild pseudowheeze over the trachea best heard    LUNGS: no acc muscle use,  Nl contour chest which is clear to A and P bilaterally without cough on insp or exp maneuvers   CV:  RRR  no s3  wit 2-3/6 SEM  or increase in P2, and no edema   ABD:  soft and nontender   MS:  Gait awkward, slow, with 4 pronged walker  ext warm without deformities Or obvious joint restrictions  calf tenderness, cyanosis  or clubbing    SKIN: warm and dry without lesions    NEURO:  alert, approp, nl sensorium with  no motor or cerebellar deficits apparent.       Assessment     Assessment & Plan Pulmonary nodule Never regular smoker CTa   08/30/23 - Persistent ground-glass opacity in the right middlelobe is stable. -  5 mm nodule in the right lower lobe in the asagoesophageal recess. No new or enlarging pulmonary nodules.  CT results reviewed with pt >>> Too small for PET or bx,  not suspicious enough for excisional bx > really only option for now is follow the Fleischner society guidelines as rec by radiology. Since she'll need annual ct's of her aorta she does not need additional pulmonary studies especially given her overall geriatric  decline and smoking hx.  Pulmonary f/u is prn    Upper airway cough syndrome Onset around 1st of 2025  dry and daytime only classic for UACS/ ACEi case. - D/c acei 02/19/2024   Upper airway cough syndrome (previously labeled PNDS),  is so named because it's frequently impossible to sort out how much is  CR/sinusitis with freq throat clearing (which can be related to primary GERD)   vs  causing  secondary ( extra esophageal)  GERD from wide swings in gastric pressure that occur with throat clearing, often  promoting self use of mint and menthol  lozenges that reduce the lower esophageal sphincter tone and exacerbate the problem further in a cyclical fashion.   These are the same pts (now being labeled as having irritable larynx syndrome by some cough centers) who not infrequently have a history of having failed to tolerate ace inhibitors,  dry powder inhalers or biphosphonates or report having atypical/extraesophageal reflux symptoms that don't respond to standard doses of PPI  and are easily confused as having aecopd or asthma flares by even experienced allergists/ pulmonologists (myself included).   F/u in 6-8 weeks if not resolved off acei (see hbp)    Essential  hypertension D/c acei (daytime dry cough/ mild pseudowheeze on exam 02/19/2024 >>>   ACE inhibitors are problematic in  pts with airway complaints  (cough in this case)  because  even experienced pulmonologists can't always distinguish ace effects from copd/asthma.  By themselves they don't actually cause a problem, much like oxygen can't by itself start a fire, but they certainly serve as a powerful catalyst or enhancer for any fire  or inflammatory process in the upper airway, be it caused by an ET  tube or more commonly reflux (especially in the obese or pts with known GERD or who are on biphoshonates).    In the era of ARB near equivalency until we have a better handle on the reversibility of the airway problem, it just makes sense to avoid ACEI  entirely in the short run and then decide later, having established a level of airway control using a reasonable limited regimen, whether to add back ace but even then being very careful to observe the pt for worsening airway control and number of meds used/ needed to control symptoms.    >>> try benicar  40 mg daily and call med if needs adjusting between now and next PCP or cards ov   Discussed in detail all the  indications, usual  risks and alternatives  relative to the benefits with patient who agrees to proceed with Rx as outlined.       Each maintenance medication was reviewed in detail including emphasizing most importantly the difference between maintenance and prns and under what circumstances the prns are to be triggered using an action plan format where appropriate.  Total time for H and P, chart review, counseling,  and generating customized AVS unique to this office visit / same day charting = 45 min new pt eval         AVS  Patient Instructions  Stop  benazapril and start olesartan 40 mg one daily and let me know if blood pressure isn't where you want it    The nodules  in the lung are tiny and harmless at this and ok to follow them  at the time of your annual aorta and if either is growing we can offer to try to biopsy that as an outpatient   Ozell America, MD 02/19/2024

## 2024-02-19 ENCOUNTER — Encounter: Payer: Self-pay | Admitting: Internal Medicine

## 2024-02-19 ENCOUNTER — Ambulatory Visit (INDEPENDENT_AMBULATORY_CARE_PROVIDER_SITE_OTHER): Admitting: Internal Medicine

## 2024-02-19 VITALS — BP 128/64 | HR 76 | Temp 98.0°F | Ht 61.0 in | Wt 149.4 lb

## 2024-02-19 DIAGNOSIS — R058 Other specified cough: Secondary | ICD-10-CM | POA: Insufficient documentation

## 2024-02-19 DIAGNOSIS — R911 Solitary pulmonary nodule: Secondary | ICD-10-CM | POA: Diagnosis not present

## 2024-02-19 DIAGNOSIS — I10 Essential (primary) hypertension: Secondary | ICD-10-CM | POA: Diagnosis not present

## 2024-02-19 MED ORDER — OLMESARTAN MEDOXOMIL 40 MG PO TABS: 40.0000 mg | ORAL_TABLET | Freq: Every day | ORAL | 11 refills | Status: AC

## 2024-02-19 NOTE — Patient Instructions (Signed)
 Stop  benazapril and start olesartan 40 mg one daily and let me know if blood pressure isn't where you want it    The nodules in the lung are tiny and harmless at this and ok to follow them at the time of your annual aorta and if either is growing we can offer to try to biopsy that as an outpatient

## 2024-02-19 NOTE — Assessment & Plan Note (Addendum)
 Onset around 1st of 2025  dry and daytime only classic for UACS/ ACEi case. - D/c acei 02/19/2024   Upper airway cough syndrome (previously labeled PNDS),  is so named because it's frequently impossible to sort out how much is  CR/sinusitis with freq throat clearing (which can be related to primary GERD)   vs  causing  secondary ( extra esophageal)  GERD from wide swings in gastric pressure that occur with throat clearing, often  promoting self use of mint and menthol  lozenges that reduce the lower esophageal sphincter tone and exacerbate the problem further in a cyclical fashion.   These are the same pts (now being labeled as having irritable larynx syndrome by some cough centers) who not infrequently have a history of having failed to tolerate ace inhibitors,  dry powder inhalers or biphosphonates or report having atypical/extraesophageal reflux symptoms that don't respond to standard doses of PPI  and are easily confused as having aecopd or asthma flares by even experienced allergists/ pulmonologists (myself included).   F/u in 6-8 weeks if not resolved off acei (see hbp)

## 2024-02-19 NOTE — Assessment & Plan Note (Addendum)
 D/c acei (daytime dry cough/ mild pseudowheeze on exam 02/19/2024 >>>   ACE inhibitors are problematic in  pts with airway complaints  (cough in this case)  because  even experienced pulmonologists can't always distinguish ace effects from copd/asthma.  By themselves they don't actually cause a problem, much like oxygen can't by itself start a fire, but they certainly serve as a powerful catalyst or enhancer for any fire  or inflammatory process in the upper airway, be it caused by an ET  tube or more commonly reflux (especially in the obese or pts with known GERD or who are on biphoshonates).    In the era of ARB near equivalency until we have a better handle on the reversibility of the airway problem, it just makes sense to avoid ACEI  entirely in the short run and then decide later, having established a level of airway control using a reasonable limited regimen, whether to add back ace but even then being very careful to observe the pt for worsening airway control and number of meds used/ needed to control symptoms.    >>> try benicar  40 mg daily and call med if needs adjusting between now and next PCP or cards ov   Discussed in detail all the  indications, usual  risks and alternatives  relative to the benefits with patient who agrees to proceed with Rx as outlined.       Each maintenance medication was reviewed in detail including emphasizing most importantly the difference between maintenance and prns and under what circumstances the prns are to be triggered using an action plan format where appropriate.  Total time for H and P, chart review, counseling,  and generating customized AVS unique to this office visit / same day charting = 45 min new pt eval

## 2024-02-19 NOTE — Assessment & Plan Note (Addendum)
 Never regular smoker CTa   08/30/23 - Persistent ground-glass opacity in the right middlelobe is stable. -  5 mm nodule in the right lower lobe in the asagoesophageal recess. No new or enlarging pulmonary nodules.  CT results reviewed with pt >>> Too small for PET or bx, not suspicious enough for excisional bx > really only option for now is follow the Fleischner society guidelines as rec by radiology. Since she'll need annual ct's of her aorta she does not need additional pulmonary studies especially given her overall geriatric  decline and smoking hx.  Pulmonary f/u is prn

## 2024-03-17 ENCOUNTER — Inpatient Hospital Stay: Attending: Nurse Practitioner

## 2024-03-17 ENCOUNTER — Inpatient Hospital Stay: Admitting: Nurse Practitioner

## 2024-03-17 ENCOUNTER — Other Ambulatory Visit: Payer: Self-pay

## 2024-03-17 ENCOUNTER — Encounter: Payer: Self-pay | Admitting: Nurse Practitioner

## 2024-03-17 VITALS — BP 132/70 | HR 60 | Temp 97.6°F | Resp 17 | Wt 148.1 lb

## 2024-03-17 DIAGNOSIS — G629 Polyneuropathy, unspecified: Secondary | ICD-10-CM | POA: Diagnosis not present

## 2024-03-17 DIAGNOSIS — Z170 Estrogen receptor positive status [ER+]: Secondary | ICD-10-CM | POA: Insufficient documentation

## 2024-03-17 DIAGNOSIS — Z79810 Long term (current) use of selective estrogen receptor modulators (SERMs): Secondary | ICD-10-CM | POA: Insufficient documentation

## 2024-03-17 DIAGNOSIS — R911 Solitary pulmonary nodule: Secondary | ICD-10-CM | POA: Insufficient documentation

## 2024-03-17 DIAGNOSIS — C50512 Malignant neoplasm of lower-outer quadrant of left female breast: Secondary | ICD-10-CM

## 2024-03-17 DIAGNOSIS — M8580 Other specified disorders of bone density and structure, unspecified site: Secondary | ICD-10-CM | POA: Insufficient documentation

## 2024-03-17 DIAGNOSIS — Z1721 Progesterone receptor positive status: Secondary | ICD-10-CM | POA: Diagnosis not present

## 2024-03-17 DIAGNOSIS — Z923 Personal history of irradiation: Secondary | ICD-10-CM | POA: Insufficient documentation

## 2024-03-17 DIAGNOSIS — Z1732 Human epidermal growth factor receptor 2 negative status: Secondary | ICD-10-CM | POA: Diagnosis not present

## 2024-03-17 LAB — CMP (CANCER CENTER ONLY)
ALT: 15 U/L (ref 0–44)
AST: 27 U/L (ref 15–41)
Albumin: 4.1 g/dL (ref 3.5–5.0)
Alkaline Phosphatase: 66 U/L (ref 38–126)
Anion gap: 7 (ref 5–15)
BUN: 20 mg/dL (ref 8–23)
CO2: 26 mmol/L (ref 22–32)
Calcium: 9.4 mg/dL (ref 8.9–10.3)
Chloride: 107 mmol/L (ref 98–111)
Creatinine: 0.85 mg/dL (ref 0.44–1.00)
GFR, Estimated: >60 mL/min (ref >60)
Glucose, Bld: 159 mg/dL — ABNORMAL HIGH (ref 70–99)
Potassium: 4.2 mmol/L (ref 3.5–5.1)
Sodium: 140 mmol/L (ref 135–145)
Total Bilirubin: 0.3 mg/dL (ref 0.0–1.2)
Total Protein: 7.3 g/dL (ref 6.5–8.1)

## 2024-03-17 LAB — CBC WITH DIFFERENTIAL (CANCER CENTER ONLY)
Abs Immature Granulocytes: 0.06 K/uL (ref 0.00–0.07)
Basophils Absolute: 0.1 K/uL (ref 0.0–0.1)
Basophils Relative: 1 %
Eosinophils Absolute: 0.3 K/uL (ref 0.0–0.5)
Eosinophils Relative: 3 %
HCT: 41.7 % (ref 36.0–46.0)
Hemoglobin: 13.8 g/dL (ref 12.0–15.0)
Immature Granulocytes: 1 %
Lymphocytes Relative: 19 %
Lymphs Abs: 1.7 K/uL (ref 0.7–4.0)
MCH: 29.9 pg (ref 26.0–34.0)
MCHC: 33.1 g/dL (ref 30.0–36.0)
MCV: 90.3 fL (ref 80.0–100.0)
Monocytes Absolute: 0.8 K/uL (ref 0.1–1.0)
Monocytes Relative: 9 %
Neutro Abs: 5.9 K/uL (ref 1.7–7.7)
Neutrophils Relative %: 67 %
Platelet Count: 256 K/uL (ref 150–400)
RBC: 4.62 MIL/uL (ref 3.87–5.11)
RDW: 13 % (ref 11.5–15.5)
WBC Count: 8.8 K/uL (ref 4.0–10.5)
nRBC: 0 % (ref 0.0–0.2)

## 2024-03-17 MED ORDER — TAMOXIFEN CITRATE 20 MG PO TABS
20.0000 mg | ORAL_TABLET | Freq: Every day | ORAL | 3 refills | Status: AC
Start: 2024-03-17 — End: ?

## 2024-03-17 NOTE — Progress Notes (Signed)
 [facility]   [phone]) [phone] [phone]    Patient Care Team: Campbell Reynolds, NP as PCP - General Nahser, Aleene PARAS, MD (Inactive) as PCP - Cardiology (Cardiology) Wonda Cy BROCKS, RD as Dietitian (Family Medicine) Tyree Nanetta SAILOR, RN as Oncology Nurse Navigator Vanderbilt Ned, MD as Consulting Physician (General Surgery) Lanny Callander, MD as Consulting Physician (Hematology) Izell Domino, MD as Attending Physician (Radiation Oncology)   CHIEF COMPLAINT: Follow-up left breast cancer  Oncology History Overview Note  Cancer Staging Malignant neoplasm of lower-outer quadrant of left breast of female, estrogen receptor positive (HCC) Staging form: Breast, AJCC 8th Edition - Clinical stage from 04/25/2021: Stage IIA (cT3, cN0, cM0, G1, ER+, PR+, HER2-) - Signed by Lanny Callander, MD on 04/26/2021    Malignant neoplasm of lower-outer quadrant of left breast of female, estrogen receptor positive (HCC)  04/05/2021 Mammogram   Exam: 3D Mammogram Diagnostic Bilateral Digital - Bilateral Breast Ultrasound - Limited Bilateral  IMPRESSION: The irregular mass in the left breast is highly suspicious of malignancy.   04/19/2021 Pathology Results   Diagnosis  Breast, left, needle core biopsy, mass 7x7x4cm BIRADS5  - INVASIVE MAMMARY CARCINOMA  - MAMMARY CARCINOMA IN SITU Microscopic Comment The biopsy material shows an infiltrative proliferation of cells arranged linearly and in small clusters. Based on the biopsy, the carcinoma appears Nottingham grade 1-2 pf 3 and measures 1.6 cm in greatest linear extent.  E-cadherin is POSITIVE supporting a ductal origin  PROGNOSTIC INDICATORS Results: The tumor cells are EQUIVOCAL for Her2 (2+). Her2 by FISH will be performed and results reported separately. Estrogen Receptor: 100%, POSITIVE, STRONG STAINING INTENSITY Progesterone Receptor: 100%, POSITIVE, STRONG STAINING INTENSITY Proliferation Marker Ki67:  5%  FLUORESCENCE IN-SITU HYBRIDIZATION Results: GROUP 5: HER2 **NEGATIVE**   04/24/2021 Initial Diagnosis   Malignant neoplasm of lower-outer quadrant of left breast of female, estrogen receptor positive (HCC)   04/25/2021 Cancer Staging   Staging form: Breast, AJCC 8th Edition - Clinical stage from 04/25/2021: Stage IIA (cT3, cN0, cM0, G1, ER+, PR+, HER2-) - Signed by Lanny Callander, MD on 04/26/2021 Stage prefix: Initial diagnosis Histologic grading system: 3 grade system   05/17/2021 Cancer Staging   Staging form: Breast, AJCC 8th Edition - Pathologic stage from 05/17/2021: Stage IB (pT3, pN0, cM0, G2, ER+, PR+, HER2-) - Signed by Lanny Callander, MD on 07/29/2021 Stage prefix: Initial diagnosis Multigene prognostic tests performed: None Histologic grading system: 3 grade system Residual tumor (R): R0 - None   05/17/2021 Definitive Surgery   FINAL MICROSCOPIC DIAGNOSIS:   A. 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  - Anterior margin free.  Tumor approaches to less than 0.1 cm of inked margin.  - Medial margin widely free of tumor.  - Posterior margin free.  Tumor approaches to 0.2 cm of inked margin.  - Ductal carcinoma in situ.   B. BREAST, LEFT ADDITIONAL LATERAL MARGIN, EXCISION:  - Invasive ductal carcinoma involving new lateral margin.  - Usual ductal hyperplasia and fibrocystic changes.  - Intraductal papilloma.  - Old fibroadenoma.   C. BREAST, LEFT ADDITIONAL SUPERIOR MARGIN, EXCISION:  - No carcinoma identified.  New superior margin free.  - Lobular neoplasia (atypical lobular hyperplasia).  - Complex sclerosing lesion.  - Fibrocystic changes and usual ductal hyperplasia.   D. BREAST, LEFT ADDITIONAL MEDIAL MARGIN, EXCISION:  - New medial margin free.  Invasive ductal carcinoma approaches  to 0.8 cm of inked margin.   E. BREAST, LEFT ADDITIONAL INFERIOR MARGIN, EXCISION:   - Small fragment of tissue suspicious for malignancy, 0.2 cm from inked margin.  - New inferior margin free.   F. BREAST, LEFT ADDITIONAL POSTERIOR MARGIN, EXCISION:  - New posterior margin free.  Invasive ductal carcinoma approaches to 0.2 cm of inked margin.   G. BREAST, LEFT ADDITIONAL ANTERIOR MARGIN, EXCISION:  - No malignancy identified.   H. LYMPH NODE, LEFT AXILLARY, SENTINEL, EXCISION:  - One lymph node, negative for carcinoma (0/1).   I. LYMPH NODE, LEFT AXILLARY, SENTINEL, EXCISION:  - One lymph node, negative for carcinoma (0/1).   J. LYMPH NODE, LEFT AXILLARY, SENTINEL, EXCISION:  - One lymph node, negative for carcinoma (0/1).   COMMENT:   The final (new) lateral resection margin is positive for carcinoma.  All other final (new) margins are negative.    07/03/2021 - 07/31/2021 Radiation Therapy   Site Technique Total Dose (Gy) Dose per Fx (Gy) Completed Fx Beam Energies  Breast, Left: Breast_L 3D 40.05/40.05 2.67 15/15 6XFFF, 10XFFF  Breast, Left: Breast_L_Bst 3D 10/10 2 5/5 6X, 10X         CURRENT THERAPY: Tamoxifen , starting 09/01/2021  INTERVAL HISTORY Yvonne [name] returns with her aide for follow-up as scheduled, last seen by me 08/26/2023.  Doing well overall with no significant changes in her health. Had a controlled fall couple weeks ago after getting out of bed, no injuries, and was able to get herself up.  Fluctuates between constipation and diarrhea without bleeding, pain, bloating.  Has a mild cough, no chest pain or dyspnea.  PCP stopped ACE inhibitor. Continues tamoxifen , no obvious side effects.  Denies changes in her breast such as new lump/mass, nipple discharge or inversion, or skin change.  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 Verlin & Dr. Lucas   Severe aortic stenosis    Stroke [facility])    pt was 35     Past Surgical History:  Procedure Laterality Date   ABDOMINAL HYSTERECTOMY     BACK SURGERY     2019   BRAIN SURGERY     age 52's- steel plate in back of head   BREAST LUMPECTOMY WITH RADIOACTIVE SEED AND SENTINEL LYMPH NODE BIOPSY Left 05/17/2021   Procedure: LEFT BREAST SEED LOCALIZED LUMPECTOMY (BRACKETED) WITH SENTINEL LYMPH NODE BIOPSY;  Surgeon: Vanderbilt Ned, MD;  Location: [facility] OR;  Service: General;  Laterality: Left;  PEC BLOCK 90 MINUTES ROOM 9   CATARACT EXTRACTION Bilateral    INTRAOPERATIVE TRANSTHORACIC ECHOCARDIOGRAM N/A 06/26/2022   Procedure: INTRAOPERATIVE TRANSTHORACIC ECHOCARDIOGRAM;  Surgeon: Verlin Lonni BIRCH, MD;  Location: [facility] OR;  Service: Open Heart Surgery;  Laterality: N/A;   RE-EXCISION OF BREAST LUMPECTOMY Left 06/08/2021   Procedure: RE-EXCISION OF LEFT BREAST LUMPECTOMY;  Surgeon: Vanderbilt Ned, MD;  Location: [facility] ORS;  Service: General;  Laterality: Left;   RIGHT HEART CATH AND CORONARY ANGIOGRAPHY N/A 05/17/2022   Procedure: RIGHT HEART CATH AND CORONARY ANGIOGRAPHY;  Surgeon: Verlin Lonni BIRCH, MD;  Location: [facility] INVASIVE CV LAB;  Service: Cardiovascular;  Laterality: N/A;   TOE AMPUTATION  2013   2nd toe on right foot, hammer toe   TONSILLECTOMY     TOOTH EXTRACTION     all teeth removed   TOTAL HIP ARTHROPLASTY Left 03/19/2017   Procedure: LEFT TOTAL HIP ARTHROPLASTY ANTERIOR APPROACH;  Surgeon: Vernetta Lonni GRADE, MD;  Location: [facility] OR;   Service: Orthopedics;  Laterality: Left;   TRANSCATHETER AORTIC VALVE REPLACEMENT, TRANSFEMORAL N/A 06/26/2022   Procedure: TRANSCATHETR AORTIC VALVE REPLACEMENT, TRANSFEMORAL;  Surgeon: Verlin Lonni BIRCH, MD;  Location: [facility] OR;  Service: Open Heart Surgery;  Laterality: N/A;   ULTRASOUND GUIDANCE FOR VASCULAR ACCESS Bilateral 06/26/2022   Procedure: ULTRASOUND GUIDANCE FOR VASCULAR ACCESS;  Surgeon: Verlin Lonni BIRCH, MD;  Location: [facility] OR;  Service: Open Heart Surgery;  Laterality: Bilateral;     Outpatient Encounter Medications as of 03/17/2024  Medication Sig   acetaminophen  (TYLENOL ) 500 MG tablet Take 2 tablets (1,000 mg total) by mouth every 6 (six) hours as needed for moderate pain, mild pain, fever or headache.   amitriptyline  (ELAVIL ) 25 MG tablet Take 25 mg by mouth at bedtime.   amLODipine  (NORVASC ) 5 MG tablet Take 5 mg by mouth daily.   aspirin  81 MG chewable tablet Chew 1 tablet (81 mg total) by mouth daily.   atorvastatin  (LIPITOR) 20 MG tablet Take 2 tablets (40 mg total) by mouth daily.   gabapentin  (NEURONTIN ) 100 MG capsule TAKE 1 CAPSULE EVERY MORNING AND TAKE 2 CAPSULES EVERY NIGHT   levothyroxine  (SYNTHROID ) 88 MCG tablet Take 1 tablet (88 mcg total) by mouth every morning. 30 minutes before food   LINZESS 72 MCG capsule Take 72 mcg by mouth every morning.   metFORMIN (GLUCOPHAGE) 500 MG tablet Take 500 mg by mouth daily.   Misc Natural Products (NEURIVA PO) Take 1 tablet by mouth daily.   Misc Natural Products (OSTEO BI-FLEX ADV TRIPLE ST) TABS Take 1 tablet by mouth in the morning. 60mg  of Vitamin C  (Ascorbic Acid); 2mg  Manganese (Manganese Sulfate); 35mg  Sodium; 1500mg  Glucosamine HCI; 100mg  5-Loxin (Boswellia Serrata Extract resin); 1103mg  (Chondroitin/MSM Complex (Chrondroitin Sulfate, Methylsulfonylmethane, Collagen (Hydrolyzed Gelatin), Boswellia Serrata (resin); Boron (Bororganic Glycine); Hyaluronic Acid   Multiple Vitamins-Minerals (CENTRUM SILVER ADULT 50+)  TABS Take 1 tablet by mouth in the morning.   olmesartan  (BENICAR ) 40 MG tablet Take 1 tablet (40 mg total) by mouth daily.   tamoxifen  (NOLVADEX ) 20 MG tablet Take 1 tablet (20 mg total) by mouth daily.   No facility-administered encounter medications on file as of 03/17/2024.     Today's Vitals   03/17/24 1053 03/17/24 1119  BP: 132/70   Pulse: 60   Resp: 17   Temp: 97.6 F (36.4 C)   SpO2: 97%   Weight: 148 lb 1.6 oz (67.2 kg)   PainSc:  0-No pain   Body mass index is 27.98 kg/m.    PHYSICAL EXAM GENERAL:alert, no distress and comfortable SKIN: no rash  EYES: sclera clear NECK: without mass LYMPH:  no palpable cervical or supraclavicular lymphadenopathy  LUNGS: clear with normal breathing effort HEART: regular rate & rhythm, no lower extremity edema ABDOMEN: abdomen soft, non-tender and normal bowel sounds NEURO: alert & oriented x 3 with fluent  speech, no focal motor/sensory deficits Breast exam: No nipple discharge or inversion.  S/p left lumpectomy and radiation, incisions completely healed with moderate scar tissue and distortion.  No palpable mass or nodularity in either breast or axilla that I could appreciate   CBC    Latest Ref Rng & Units 03/17/2024   10:36 AM 08/26/2023   11:07 AM 04/27/2023   10:09 AM  CBC  WBC 4.0 - 10.5 K/uL 8.8  8.4  10.9   Hemoglobin 12.0 - 15.0 g/dL 86.1  87.1  86.9   Hematocrit 36.0 - 46.0 % 41.7  40.4  40.3   Platelets 150 - 400 K/uL 256  220  297       CMP     Latest Ref Rng & Units 03/17/2024   10:36 AM 08/26/2023   11:07 AM 08/22/2023    1:27 PM  CMP  Glucose 70 - 99 mg/dL 840  733  827   BUN 8 - 23 mg/dL 20  14  14    Creatinine 0.44 - 1.00 mg/dL 9.14  9.22  9.24   Sodium 135 - 145 mmol/L 140  139  142   Potassium 3.5 - 5.1 mmol/L 4.2  4.5  4.9   Chloride 98 - 111 mmol/L 107  103  102   CO2 22 - 32 mmol/L 26  29  20    Calcium  8.9 - 10.3 mg/dL 9.4  9.1  9.6   Total Protein 6.5 - 8.1 g/dL 7.3  7.0    Total Bilirubin 0.0 -  1.2 mg/dL 0.3  0.3    Alkaline Phos 38 - 126 U/L 66  52    AST 15 - 41 U/L 27  37    ALT 0 - 44 U/L 15  18        ASSESSMENT & PLAN: Yvonne [name] is a 67 post menopausal female    1. 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 Vanderbilt showed: 5.5 cm IDC with DCIS. Final lateral margin was positive. Other margins and lymph nodes negative. -she received radiation under Dr. Izell 1/16-2/13/23. -she started tamoxifen  on 09/01/21, tolerating well without significant side effects -Mammogram 04/15/2023 is benign, repeat 03/2024 at [facility] -Ms. Donte is clinically doing well, tolerating tamoxifen , exam is benign, labs are unremarkable.  Overall no clinical concern for recurrence -Continue breast cancer surveillance and tamoxifen  -Follow-up with me in 1 year, or sooner if needed   2. Bone Health -repeat DEXA on 05/01/21 showed osteopenia (T-score of -2.0). -we previously discussed tamoxifen  can strengthen her bones.   3. Neuropathy -Patient is unsure of the source of her neuropathy, which may be from hyperglycemia, back issues and prior spinal surgeries, and/or other -She feels this is contributing to imbalance and falls, completed PT which helped - I referred her to neuro, seeing Dr. Margaret  -Controlled fall couple weeks ago, no injuries   4.  Lung nodule -Seen on cardiac CT 08/2023, cardiology referred to Dr. Darlean with the pulmonary clinic who felt too small for PET or biopsy and not suspicious enough for excisional biopsy -Recommended to follow with annual CTs of the aorta aneurysm    PLAN: -Labs reviewed -Continue breast cancer surveillance and Tamoxifen  (goal 5 years) -Mammogram in 03/2024 at [facility] -F/up with me in 1 year, or sooner if needed   Orders Placed This Encounter  Procedures   MM DIAG BREAST TOMO  BILATERAL    Standing Status:   Future    Expected  Date:   04/15/2024    Expiration Date:   03/17/2025    Scheduling Instructions:     [facility]    Reason for Exam (SYMPTOM  OR DIAGNOSIS REQUIRED):   h/o left breast cancer    Preferred imaging location?:   External      All questions were answered. The patient knows to call the clinic with any problems, questions or concerns. No barriers to learning were detected.   Florrie Ramires K Farzad Tibbetts, NP 03/17/2024

## 2025-03-15 ENCOUNTER — Ambulatory Visit: Admitting: Nurse Practitioner

## 2025-03-15 ENCOUNTER — Other Ambulatory Visit
